# Patient Record
Sex: Male | Born: 1965 | Race: Black or African American | Hispanic: No | Marital: Married | Smoking: Former smoker
Health system: Southern US, Community
[De-identification: ages and names within clinical notes are randomized; demographics above are authoritative.]

## PROBLEM LIST (undated history)

## (undated) DIAGNOSIS — N183 Chronic kidney disease, stage 3 unspecified: Secondary | ICD-10-CM

## (undated) DIAGNOSIS — I712 Thoracic aortic aneurysm, without rupture, unspecified: Secondary | ICD-10-CM

## (undated) DIAGNOSIS — G4730 Sleep apnea, unspecified: Secondary | ICD-10-CM

## (undated) DIAGNOSIS — K8020 Calculus of gallbladder without cholecystitis without obstruction: Secondary | ICD-10-CM

## (undated) DIAGNOSIS — I509 Heart failure, unspecified: Secondary | ICD-10-CM

## (undated) DIAGNOSIS — M109 Gout, unspecified: Secondary | ICD-10-CM

## (undated) DIAGNOSIS — I4819 Other persistent atrial fibrillation: Secondary | ICD-10-CM

## (undated) DIAGNOSIS — IMO0001 Reserved for inherently not codable concepts without codable children: Secondary | ICD-10-CM

## (undated) DIAGNOSIS — Z87442 Personal history of urinary calculi: Secondary | ICD-10-CM

## (undated) DIAGNOSIS — I10 Essential (primary) hypertension: Secondary | ICD-10-CM

## (undated) DIAGNOSIS — E785 Hyperlipidemia, unspecified: Secondary | ICD-10-CM

## (undated) DIAGNOSIS — I2781 Cor pulmonale (chronic): Secondary | ICD-10-CM

## (undated) DIAGNOSIS — E119 Type 2 diabetes mellitus without complications: Secondary | ICD-10-CM

## (undated) HISTORY — DX: Reserved for inherently not codable concepts without codable children: IMO0001

## (undated) HISTORY — DX: Essential (primary) hypertension: I10

## (undated) HISTORY — DX: Calculus of gallbladder without cholecystitis without obstruction: K80.20

## (undated) HISTORY — DX: Morbid (severe) obesity due to excess calories: E66.01

## (undated) HISTORY — DX: Sleep apnea, unspecified: G47.30

## (undated) HISTORY — DX: Hyperlipidemia, unspecified: E78.5

## (undated) HISTORY — DX: Heart failure, unspecified: I50.9

---

## 1998-05-24 HISTORY — PX: LAPAROSCOPIC CHOLECYSTECTOMY: SUR755

## 1998-07-03 ENCOUNTER — Encounter: Payer: Self-pay | Admitting: Emergency Medicine

## 1998-07-03 ENCOUNTER — Emergency Department (HOSPITAL_COMMUNITY): Admission: EM | Admit: 1998-07-03 | Discharge: 1998-07-03 | Payer: Self-pay | Admitting: Emergency Medicine

## 1998-09-04 ENCOUNTER — Inpatient Hospital Stay (HOSPITAL_COMMUNITY): Admission: EM | Admit: 1998-09-04 | Discharge: 1998-09-05 | Payer: Self-pay | Admitting: Emergency Medicine

## 1999-10-16 ENCOUNTER — Encounter: Admission: RE | Admit: 1999-10-16 | Discharge: 1999-10-16 | Payer: Self-pay | Admitting: Family Medicine

## 1999-10-16 ENCOUNTER — Encounter: Payer: Self-pay | Admitting: Family Medicine

## 2000-04-20 ENCOUNTER — Ambulatory Visit (HOSPITAL_BASED_OUTPATIENT_CLINIC_OR_DEPARTMENT_OTHER): Admission: RE | Admit: 2000-04-20 | Discharge: 2000-04-20 | Payer: Self-pay | Admitting: Nephrology

## 2002-08-15 ENCOUNTER — Emergency Department (HOSPITAL_COMMUNITY): Admission: EM | Admit: 2002-08-15 | Discharge: 2002-08-15 | Payer: Self-pay | Admitting: Emergency Medicine

## 2002-08-15 ENCOUNTER — Encounter: Payer: Self-pay | Admitting: Emergency Medicine

## 2004-03-26 ENCOUNTER — Ambulatory Visit (HOSPITAL_BASED_OUTPATIENT_CLINIC_OR_DEPARTMENT_OTHER): Admission: RE | Admit: 2004-03-26 | Discharge: 2004-03-26 | Payer: Self-pay | Admitting: Family Medicine

## 2004-06-22 ENCOUNTER — Ambulatory Visit: Payer: Self-pay | Admitting: Internal Medicine

## 2005-01-13 LAB — HM COLONOSCOPY: HM Colonoscopy: NORMAL

## 2005-09-15 ENCOUNTER — Ambulatory Visit: Payer: Self-pay | Admitting: Internal Medicine

## 2005-09-27 ENCOUNTER — Ambulatory Visit: Payer: Self-pay | Admitting: Internal Medicine

## 2005-09-27 ENCOUNTER — Encounter (INDEPENDENT_AMBULATORY_CARE_PROVIDER_SITE_OTHER): Payer: Self-pay | Admitting: Specialist

## 2007-08-18 DIAGNOSIS — J310 Chronic rhinitis: Secondary | ICD-10-CM

## 2007-08-21 ENCOUNTER — Ambulatory Visit: Payer: Self-pay | Admitting: Internal Medicine

## 2007-08-21 DIAGNOSIS — J9600 Acute respiratory failure, unspecified whether with hypoxia or hypercapnia: Secondary | ICD-10-CM

## 2007-08-27 DIAGNOSIS — G4733 Obstructive sleep apnea (adult) (pediatric): Secondary | ICD-10-CM

## 2007-09-20 ENCOUNTER — Ambulatory Visit: Payer: Self-pay | Admitting: Internal Medicine

## 2007-09-28 ENCOUNTER — Encounter: Payer: Self-pay | Admitting: Internal Medicine

## 2008-04-07 ENCOUNTER — Emergency Department (HOSPITAL_COMMUNITY): Admission: EM | Admit: 2008-04-07 | Discharge: 2008-04-07 | Payer: Self-pay | Admitting: *Deleted

## 2009-09-19 ENCOUNTER — Inpatient Hospital Stay (HOSPITAL_COMMUNITY): Admission: EM | Admit: 2009-09-19 | Discharge: 2009-09-22 | Payer: Self-pay | Admitting: Emergency Medicine

## 2009-09-20 ENCOUNTER — Encounter (INDEPENDENT_AMBULATORY_CARE_PROVIDER_SITE_OTHER): Payer: Self-pay | Admitting: Internal Medicine

## 2009-09-21 ENCOUNTER — Encounter: Payer: Self-pay | Admitting: Cardiology

## 2009-09-21 HISTORY — PX: US ECHOCARDIOGRAPHY: HXRAD669

## 2009-09-27 ENCOUNTER — Emergency Department (HOSPITAL_COMMUNITY): Admission: EM | Admit: 2009-09-27 | Discharge: 2009-09-27 | Payer: Self-pay | Admitting: Emergency Medicine

## 2009-10-02 ENCOUNTER — Ambulatory Visit: Payer: Self-pay | Admitting: Thoracic Surgery (Cardiothoracic Vascular Surgery)

## 2010-03-27 ENCOUNTER — Ambulatory Visit: Payer: Self-pay | Admitting: Cardiology

## 2010-04-06 ENCOUNTER — Ambulatory Visit: Payer: Self-pay | Admitting: Thoracic Surgery (Cardiothoracic Vascular Surgery)

## 2010-04-06 ENCOUNTER — Encounter
Admission: RE | Admit: 2010-04-06 | Discharge: 2010-04-06 | Payer: Self-pay | Admitting: Thoracic Surgery (Cardiothoracic Vascular Surgery)

## 2010-08-11 LAB — BASIC METABOLIC PANEL
BUN: 14 mg/dL (ref 6–23)
CO2: 38 mEq/L — ABNORMAL HIGH (ref 19–32)
Calcium: 8.8 mg/dL (ref 8.4–10.5)
Calcium: 8.8 mg/dL (ref 8.4–10.5)
Calcium: 8.7 mg/dL (ref 8.4–10.5)
Chloride: 97 mEq/L (ref 96–112)
Chloride: 97 mEq/L (ref 96–112)
GFR calc Af Amer: >60 mL/min (ref >60)
GFR calc Af Amer: >60 mL/min (ref >60)
GFR calc Af Amer: >60 mL/min (ref >60)
GFR calc non Af Amer: 60 mL/min — ABNORMAL LOW (ref >60)
GFR calc non Af Amer: >60 mL/min (ref >60)
GFR calc non Af Amer: >60 mL/min (ref >60)
Potassium: 2.9 mEq/L — ABNORMAL LOW (ref 3.5–5.1)
Sodium: 140 mEq/L (ref 135–145)
Sodium: 144 mEq/L (ref 135–145)
Sodium: 142 mEq/L (ref 135–145)

## 2010-08-11 LAB — CBC
HCT: 44 % (ref 39.0–52.0)
Hemoglobin: 15.1 g/dL (ref 13.0–17.0)
MCHC: 34.3 g/dL (ref 30.0–36.0)
MCV: 93.1 fL (ref 78.0–100.0)
Platelets: 216 10*3/uL (ref 150–400)
Platelets: 220 10*3/uL (ref 150–400)
Platelets: 239 10*3/uL (ref 150–400)
RBC: 4.19 MIL/uL — ABNORMAL LOW (ref 4.22–5.81)
RDW: 13.8 % (ref 11.5–15.5)
WBC: 7.4 10*3/uL (ref 4.0–10.5)

## 2010-08-11 LAB — CULTURE, BLOOD (ROUTINE X 2): Culture: NO GROWTH

## 2010-08-11 LAB — POCT I-STAT 3, ART BLOOD GAS (G3+)
Acid-Base Excess: 10 mmol/L — ABNORMAL HIGH (ref 0.0–2.0)
O2 Saturation: 99 %
Patient temperature: 98.6
pCO2 arterial: 68.7 mmHg (ref 35.0–45.0)

## 2010-08-11 LAB — URINALYSIS, ROUTINE W REFLEX MICROSCOPIC
Bilirubin Urine: NEGATIVE
Protein, ur: 100 mg/dL — AB
Urobilinogen, UA: 1 mg/dL (ref 0.0–1.0)

## 2010-08-11 LAB — HEMOGLOBIN A1C: Mean Plasma Glucose: 134 mg/dL — ABNORMAL HIGH (ref <117)

## 2010-08-11 LAB — CARDIAC PANEL(CRET KIN+CKTOT+MB+TROPI)
CK, MB: 4.6 ng/mL — ABNORMAL HIGH (ref 0.3–4.0)
CK, MB: 4.9 ng/mL — ABNORMAL HIGH (ref 0.3–4.0)
Relative Index: 2.7 — ABNORMAL HIGH (ref 0.0–2.5)
Relative Index: 3 — ABNORMAL HIGH (ref 0.0–2.5)
Troponin I: 0.12 ng/mL — ABNORMAL HIGH (ref 0.00–0.06)
Troponin I: 0.13 ng/mL — ABNORMAL HIGH (ref 0.00–0.06)

## 2010-08-11 LAB — DIFFERENTIAL
Basophils Absolute: 0 10*3/uL (ref 0.0–0.1)
Basophils Relative: 1 % (ref 0–1)
Eosinophils Relative: 2 % (ref 0–5)
Neutro Abs: 5 10*3/uL (ref 1.7–7.7)
Neutrophils Relative %: 68 % (ref 43–77)

## 2010-08-11 LAB — POCT I-STAT, CHEM 8
BUN: 14 mg/dL (ref 6–23)
Chloride: 100 mEq/L (ref 96–112)
Creatinine, Ser: 1.1 mg/dL (ref 0.4–1.5)
Glucose, Bld: 141 mg/dL — ABNORMAL HIGH (ref 70–99)
Hemoglobin: 16.3 g/dL (ref 13.0–17.0)
TCO2: 34 mmol/L (ref 0–100)

## 2010-08-11 LAB — BRAIN NATRIURETIC PEPTIDE
Pro B Natriuretic peptide (BNP): 50 pg/mL (ref 0.0–100.0)
Pro B Natriuretic peptide (BNP): 135 pg/mL — ABNORMAL HIGH (ref 0.0–100.0)

## 2010-08-11 LAB — LIPID PANEL
LDL Cholesterol: 142 mg/dL — ABNORMAL HIGH (ref 0–99)
Triglycerides: 204 mg/dL — ABNORMAL HIGH (ref <150)

## 2010-08-11 LAB — URINE CULTURE
Colony Count: NO GROWTH
Culture: NO GROWTH

## 2010-08-11 LAB — TROPONIN I: Troponin I: 0.18 ng/mL — ABNORMAL HIGH (ref 0.00–0.06)

## 2010-08-11 LAB — TSH: TSH: 2.81 u[IU]/mL (ref 0.350–4.500)

## 2010-08-11 LAB — URINE MICROSCOPIC-ADD ON

## 2010-08-11 LAB — MAGNESIUM
Magnesium: 1.5 mg/dL (ref 1.5–2.5)
Magnesium: 1.8 mg/dL (ref 1.5–2.5)

## 2010-10-06 NOTE — Consult Note (Signed)
NEW PATIENT CONSULTATION   Jay Chen, Jay Chen  DOB:  [birth year]                                        Oct 02, 2009  CHART #:  [id number]   REASON FOR CONSULTATION:  Ascending aortic aneurysm.   HISTORY OF PRESENT ILLNESS:  The patient is a 45 year old gentleman who  is recently hospitalized back in April at [facility].  He presented with  shortness of breath.  He was found to have hypertensive crisis.  His  blood pressure apparently was [id number] systolic in the emergency room.  He  was admitted and had a workup, which included a chest CT and  echocardiogram.  He has a history of hypertension, but had not been  taking his medications prior to that admission.  Chest CT showed a 4.7-  cm ascending aortic aneurysm.  The echocardiogram showed severe left  ventricular hypertrophy.  There was mild aortic regurgitation.  There  was mild mitral regurgitation.  He had some transient bradycardia during  his admission and was not started on a beta-blocker.  He was discharged  on lisinopril and Norvasc.   PAST MEDICAL HISTORY:  Significant for hypertension, possible sleep  apnea, recent hypertensive emergency complicated by pulmonary edema,  newly diagnosed thoracic aortic aneurysm, and morbid obesity.   CURRENT MEDICATIONS:  1. Albuterol 1-2 puffs q.4 h p.r.n., which is started on his recent      hospitalization.  2. Norvasc 5 mg b.i.d.  3. Lasix 20 mg b.i.d.  4. Potassium 40 mEq b.i.d.  5. Lipitor 40 mg daily.  6. Lisinopril 20 mg daily.  7. Mucinex p.r.n.   ALLERGIES:  He has no known drug allergies.   FAMILY HISTORY:  Negative for aneurysms to the patient's knowledge.   SOCIAL HISTORY:  He is married.  He has 2 children, one boy and one girl  ages 21 and 55.  He works as a Copywriter, advertising for [employer].  He says he  did smoke occasionally in college, but only on weekends and never was a  regular smoker on a daily basis.  He does not drink.   REVIEW OF SYSTEMS:  He  complains of shortness of breath with exertion  and shortness of breath with lying flat.  He is using home oxygen since  discharge from the hospital.  All other systems are negative.   PHYSICAL EXAMINATION:  Vital Signs:  His blood pressure is 130/92, pulse  76, respirations are 18, and his oxygen saturation is 95% on room air.  Neurological:  He is alert and oriented x3 with no focal deficits.  HEENT:  Unremarkable.  Neck:  Without thyromegaly or palpable adenopathy  or bruits.  Cardiac:  Heart sounds are difficult to hear.  There is no  obvious murmur.  Lungs:  Diminished, but equal breath sounds  bilaterally.  There are no palpable femoral or popliteal aneurysms.  Abdomen:  Morbidly obese and unable to palpate any type of pulsatile  mass there.   LABORATORY DATA:  CT of the chest is reviewed shows a fusiform dilated  ascending aorta, maximal diameter of 4.7 cm.  Echocardiogram report  reviewed revealed mild aortic insufficiency and severe left ventricular  hypertrophy.   Sodium was 139, potassium 4.2, BUN 15, creatinine 1.1, albumin is 4.0.  LFTs within normal limits.  Urinalysis had small amount of  leukocytes.   IMPRESSION:  The patient is a 45 year old gentleman with hypertension  and morbid obesity, now found to have a 4.7 cm ascending aortic  aneurysm.  This would not meet size criteria for surgical replacement.  I reviewed the CT images with the patient and had a long discussion with  him regarding the importance of proper blood pressure management.  His  blood pressure today is 138/92, which under normal circumstances would  not be a great number, but compared to his [id number] systolic on admission, it  is markedly improved.  It is absolutely vital that he continued to have  proper blood pressure management.  Hopefully, he will tolerate being put  on a beta-blocker in addition to his ACE inhibitor and calcium channel  blockers in the future.  He is going to follow up with Dr.  Doreatha Lew  regarding that question.  He does understand that the blood pressure  control is absolute vital to try in slow down or limit the expansion of  his aneurysm.  Not mentioned to reduce the workload on his markedly  hypertrophied heart and decrease his risk for MI or other adverse  cardiac event.   We also discussed the importance of exercise and weight loss, both of  those are vital to his long-term health.  He understands that and seems  appropriately committed to changing his lifestyle particularly important  given that he has young children.   I have recommended the patient that we follow him up in 6 months with a  CT angio of the chest to reassess the ascending aorta, make sure there  is no signs of expansion.  I will schedule him for a return visit and  the CT at that time.   [name], M.D.  Electronically Signed   SCH/MEDQ  D:  10/02/2009  T:  10/03/2009  Job:  [id number]   cc:   Lauraine Rinne, MD  Ludwig Lean. Doreatha Lew, M.D.

## 2010-10-06 NOTE — Assessment & Plan Note (Signed)
OFFICE VISIT   Jay Chen, Jay Chen  DOB:  November 16, 1965                                        April 06, 2010  CHART #:  [id number]   HISTORY:  The patient is a 45 year old gentleman, who was hospitalized  back in the spring at [facility] with hypertensive crisis.  A chest CT was  done at that time and showed a 4.7-4.8 cm ascending aortic aneurysm.  Echocardiogram showed some mild aortic regurgitation and severe left  ventricular hypertrophy.  He was treated with medications.  I saw him in  May.  I reviewed the scans at that time that the aneurysm was not large  enough to warrant surgical intervention and I did advise him regarding  blood pressure control as well as exercise and weight loss.  He states  he really has not been able to lose much weight.  He has been feeling  well.  He states his blood pressure has been pretty well controlled,  usually running about [id number] systolic, A999333 diastolic.  He has not had  any recent medication changes.  He denies any chest pain or shortness of  breath.  He states that his exercise has not been regular.   PAST MEDICAL HISTORY:  Significant for morbid obesity, hypertension,  ascending aortic aneurysm, and sleep apnea.   CURRENT MEDICATIONS:  1. Albuterol inhaler 1-2 q.4 h. p.r.n.  2. Norvasc 5 mg b.i.d.  3. Lasix 20 mg b.i.d.  4. Potassium 40 mEq b.i.d.  5. Lopressor daily.  6. Lisinopril 20 mg daily.  7. Mucinex p.r.n.   REVIEW OF SYSTEMS:  See HPI, otherwise negative.   PHYSICAL EXAMINATION:  General:  The patient is a 45 year old gentleman,  in no acute distress.  He is morbidly obese, otherwise unremarkable.  Vital Signs:  His blood pressure is 155/95, pulse 91, respirations are  18, and his oxygen saturation is 92% on room air.  Neurologic:  Intact.  Lungs:  Equal breath sounds bilaterally.  Cardiac:  Regular rate and  rhythm.  Normal S1 and S2.  I do not hear a murmur.  Neck:  There is no  carotid bruits.   Extremities:  He has 2+ pulses throughout.   LABORATORY DATA:  CT of the chest is reviewed.  It reveals no change in  the diameter of the ascending aorta in comparison to his scan from 6  months ago.  Radiologist's official measurement is 4.9 cm, it is  certainly no larger than that.   IMPRESSION:  The patient is a 45 year old gentleman with a 4.9-cm  ascending aorta.  This does qualify as an aneurysm, but it does not meet  the criteria for surgical intervention.  He does need to be followed.  I  will plan to see him back in 12 months, we will do a CT angiogram of the  chest at that time.  He knows to call 911 and go to the emergency room  immediately if he were to have any kind of severe chest pains.  His  blood pressure is a little elevated today.  He states that he was  anxious about the scan and seeing me, thinking he might need surgery at  this time, so I do not make any change to his medications, thinking this  might be some white coat hypertension, but did advise him  to keep track  of that and if he is persistently in that range, he needs to talk to Dr.  Mellody Drown or Dr. Doreatha Lew about medication issues.  I think it is best if I  leave his medical treatment to those physicians at this time.   [name], M.D.  Electronically Signed   SCH/MEDQ  D:  04/06/2010  T:  04/07/2010  Job:  [id number]   cc:   Lauraine Rinne, MD  Ludwig Lean. Doreatha Lew, M.D.

## 2010-10-09 NOTE — Procedures (Signed)
NAME:  Jay Chen, Jay Chen               ACCOUNT NO.:  [id number]   MEDICAL RECORD NO.:  [id number]          PATIENT TYPE:  OUT   LOCATION:  SLEEP CENTER                 FACILITY:  [facility]   PHYSICIAN:  Clinton D. Annamaria Boots, M.D. DATE OF BIRTH:  March 31, 1966   DATE OF STUDY:  03/26/2004                              NOCTURNAL POLYSOMNOGRAM   REFERRING PHYSICIAN:  Carolann Littler, M.D.   INDICATION FOR STUDY AND HISTORY:  Hypersomnia with sleep apnea.   Epworth sleepiness score 13/24, BMI 48,  weight 320 pounds, neck size 20  inches.   SLEEP ARCHITECTURE:  Total sleep time 292 minutes with sleep efficiency of  69%.  Stage I was 10%; stage II, 75%; Stages III and IV 4% which is  increased.  REM was 12% of total sleep time.  Latency to sleep onset 7  minutes, latency to REM 112 minutes.  Awake after sleep onset 126 minutes.  Arousal index elevated at 87.  Most arousals were related to respiratory  events.   RESPIRATORY DATA:  Split study protocol.  RDI 114.3 per hour indicating very  severe obstructive sleep apnea/hypopnea syndrome before CPAP.  This included  234 obstructive apneas and 8 hypopneas before CPAP.  Events were not  positional.  REM RDI 20.9 per hour.  CPAP was then titrated to 21 CWP, RDI  7.4 per hour.  Several masks were tried ending with a large ResMed Ultra  Mirage nasal/oral mask and heated humidifier.  The patient indicated  claustrophobia and had some difficulty with mask comfort.  He had taken  Sudafed at 0455 a.m. because of sinus congestion.   OXYGEN DATA:  Severe snoring with oxygen desaturation to a nadir of 73%  before CPAP.  After final CPAP control, saturation held 95 to 100% on room  air.   CARDIAC DATA:  Normal sinus rhythm.   MOVEMENT AND PARASOMNIA:  Occasional leg jerks.  Bathroom x 2.   IMPRESSION AND RECOMMENDATIONS:  1.  Very severe obstructive sleep apnea/hypopnea syndrome, RDI 114 per hour      with desaturation to 73%.  2.  CPAP titration to a  relatively high pressure of 21 CWP using a large      ResMed Ultra Mirage nasal/oral mask and heated humidifier.  The patient      may eventually be more comfortable using bilevel PAP instead of CPAP.   Clinton D. Annamaria Boots, M.D.  Diplomate, [employer] adviser of Sleep Medicine                                                           Clinton D. Annamaria Boots, M.D.  Diplomate, [employer] Board   CDY/MEDQ  D:  04/05/2004 12:45:26  T:  04/05/2004 15:24:15  Job:  [id number]   cc:   Carolann Littler, M.D.  [address]  [place]  [place] [address]  Fax: [phone]

## 2010-12-30 ENCOUNTER — Encounter: Payer: Self-pay | Admitting: Nurse Practitioner

## 2011-01-08 ENCOUNTER — Ambulatory Visit (INDEPENDENT_AMBULATORY_CARE_PROVIDER_SITE_OTHER): Payer: BC Managed Care – PPO | Admitting: Nurse Practitioner

## 2011-01-08 ENCOUNTER — Encounter: Payer: Self-pay | Admitting: Nurse Practitioner

## 2011-01-08 VITALS — BP 150/100 | Ht 60.0 in | Wt 393.0 lb

## 2011-01-08 DIAGNOSIS — I10 Essential (primary) hypertension: Secondary | ICD-10-CM

## 2011-01-08 DIAGNOSIS — IMO0001 Reserved for inherently not codable concepts without codable children: Secondary | ICD-10-CM

## 2011-01-08 DIAGNOSIS — I279 Pulmonary heart disease, unspecified: Secondary | ICD-10-CM

## 2011-01-08 DIAGNOSIS — I712 Thoracic aortic aneurysm, without rupture: Secondary | ICD-10-CM

## 2011-01-08 DIAGNOSIS — I2781 Cor pulmonale (chronic): Secondary | ICD-10-CM

## 2011-01-08 DIAGNOSIS — E669 Obesity, unspecified: Secondary | ICD-10-CM

## 2011-01-08 MED ORDER — AMLODIPINE BESYLATE 5 MG PO TABS: 5.0000 mg | ORAL_TABLET | Freq: Two times a day (BID) | ORAL | Status: DC

## 2011-01-08 NOTE — Assessment & Plan Note (Signed)
I think weight loss is crucial. Unfortunately I do not feel he has the motivation to make changes. Weight Watchers is encouraged once again.

## 2011-01-08 NOTE — Assessment & Plan Note (Signed)
He is due for repeat scans in November with [name].

## 2011-01-08 NOTE — Patient Instructions (Signed)
Increase your Norvasc (amlodipine) to 5 mg two times a day Get a new blood pressure cuff - Omron is the better brand I will see you in a month Weight Watcher's is strongly suggested You need to see [name] in November for your repeat CT scan Call for any problems.

## 2011-01-08 NOTE — Assessment & Plan Note (Signed)
I do not think his blood pressure has been controlled. I have increased the Norvasc to BID. I will see again in one month. He is encouraged to get a new bp cuff and start monitoring at home. Patient is agreeable to this plan and will call if any problems develop in the interim.

## 2011-01-08 NOTE — Progress Notes (Signed)
Jay Chen Date of Birth: 1965/07/04   History of Present Illness: Jay Chen is seen back today for an approximate 9 month check. He is seen for Dr. Percival Spanish. He is a former patient of Dr. Susa Simmonds. He has cor pulmonale from morbid obesity. He had a heart failure admission last year. He was found to have a thoracic aneurysm. He is due for repeat scan and visit with [facility] in November. He is not having chest pain. His blood pressure has not been checked in about 1 month. His cuff broke. Prior to that his readings were in the 140/90's. He has not lost weight and unfortunately, continues to gain weight. He is not having swelling. He says his breathing is ok as long as he does not over exert. He is now diabetic. He has had recent labs with his primary care.   Current Outpatient Prescriptions on File Prior to Visit  Medication Sig Dispense Refill  . ALBUTEROL IN Inhale into the lungs as needed. 2 puffs       . atorvastatin (LIPITOR) 40 MG tablet Take 40 mg by mouth daily.        . cloNIDine (CATAPRES) 0.1 MG tablet Take 0.1 mg by mouth daily.        . fish oil-omega-3 fatty acids 1000 MG capsule Take 2 g by mouth daily.        . furosemide (LASIX) 20 MG tablet Take 20 mg by mouth daily.       . GuaiFENesin (MUCINEX PO) Take 600 mg by mouth as needed.        [name] lisinopril (PRINIVIL,ZESTRIL) 20 MG tablet Take 40 mg by mouth daily.       [name] MAGNESIUM PO Take 64 mg by mouth daily.        . Metformin HCl 500 MG/5ML SOLN Take 500 mg by mouth 2 (two) times daily.        . potassium chloride 40 MEQ/15ML (20%) LIQD Take by mouth. 2 BID       . DISCONTD: amLODipine (NORVASC) 5 MG tablet Take 5 mg by mouth daily.         No Known Allergies  Past Medical History  Diagnosis Date  . Congestive heart failure May of 2011    Felt to have cor pulmonale  . Hypertension     heart disease  . Thoracic aneurysm     Followed by [name]  . Morbid obesity   . Diabetes mellitus     Past Surgical History   Procedure Date  . US echocardiography 09/21/2009    EF 45-50%  . Cholecystectomy 2000    History  Smoking status  . Never Smoker   Smokeless tobacco  . Never Used    History  Alcohol Use  . Yes    Rarely    Family History  Problem Relation Age of Onset  . Hypertension Mother   . Pancreatic cancer Father     Review of Systems: The review of systems is positive for elevated blood pressure. No change in his shortness of breath. No chest pain. He will have some occasional belly pain. No back pain.  All other systems were reviewed and are negative.  Physical Exam: BP 150/100  Ht 5' (1.524 m)  Wt 393 lb (178.264 kg)  BMI 76.75 kg/m2 Patient is pleasant and in no acute distress. He is morbidly obese. Skin is warm and dry. Color is normal.  HEENT is unremarkable. Normocephalic/atraumatic. PERRL. Sclera are nonicteric.  Neck is supple. No masses. No JVD. Lungs are clear but decreased. Cardiac exam shows a regular rate and rhythm. Heart tones are distant. Abdomen is obese but soft. Extremities are full without edema. Gait and ROM are intact. No gross neurologic deficits noted.  LABORATORY DATA:   Assessment / Plan:

## 2011-01-13 ENCOUNTER — Telehealth: Payer: Self-pay | Admitting: Nurse Practitioner

## 2011-01-13 NOTE — Telephone Encounter (Signed)
Needs last OV Faxed

## 2011-01-13 NOTE — Telephone Encounter (Signed)
Last OV faxed today

## 2011-02-04 ENCOUNTER — Other Ambulatory Visit: Payer: Self-pay | Admitting: Orthopedic Surgery

## 2011-02-04 DIAGNOSIS — M25511 Pain in right shoulder: Secondary | ICD-10-CM

## 2011-02-08 ENCOUNTER — Encounter: Payer: Self-pay | Admitting: Nurse Practitioner

## 2011-02-08 ENCOUNTER — Ambulatory Visit (INDEPENDENT_AMBULATORY_CARE_PROVIDER_SITE_OTHER): Payer: BC Managed Care – PPO | Admitting: Nurse Practitioner

## 2011-02-08 VITALS — BP 140/90 | HR 68 | Ht 68.0 in | Wt 378.0 lb

## 2011-02-08 DIAGNOSIS — I2781 Cor pulmonale (chronic): Secondary | ICD-10-CM

## 2011-02-08 DIAGNOSIS — I10 Essential (primary) hypertension: Secondary | ICD-10-CM

## 2011-02-08 DIAGNOSIS — I279 Pulmonary heart disease, unspecified: Secondary | ICD-10-CM

## 2011-02-08 DIAGNOSIS — I712 Thoracic aortic aneurysm, without rupture: Secondary | ICD-10-CM

## 2011-02-08 DIAGNOSIS — IMO0001 Reserved for inherently not codable concepts without codable children: Secondary | ICD-10-CM

## 2011-02-08 DIAGNOSIS — E669 Obesity, unspecified: Secondary | ICD-10-CM

## 2011-02-08 MED ORDER — CLONIDINE HCL 0.2 MG PO TABS: 0.2000 mg | ORAL_TABLET | Freq: Every day | ORAL | Status: DC

## 2011-02-08 NOTE — Assessment & Plan Note (Signed)
He is due to see [name] in November. [facility] has him on a recall list per my phone conversation this morning.

## 2011-02-08 NOTE — Assessment & Plan Note (Signed)
He has embarked on a program of weight loss. We discussed this in detail today.

## 2011-02-08 NOTE — Patient Instructions (Addendum)
You should be seeing [name] in November regarding your repeat CT scan for your aneurysm. They will be setting up your appointment next month.   I want you to increase your Clonidine to 0.2 mg at bedtime I will see you in a month  Continue to work on your weight and diet. I congratulate you for the progress you have made this past month.

## 2011-02-08 NOTE — Assessment & Plan Note (Signed)
Blood pressure is not at goal. Will increase the Clonidine to 0.2 mg at bedtime. I will see again in one month. Patient is agreeable to this plan and will call if any problems develop in the interim.

## 2011-02-08 NOTE — Progress Notes (Signed)
    Jay Chen Date of Birth: 02/13/1966   History of Present Illness: Jay Chen is seen back today for a one month check. He is seen for Dr. Percival Spanish. I increased his Norvasc for his blood pressure one month ago. Blood pressure is a little better but still not at goal. He has no complaints. He has gotten a new blood pressure cuff. He is tolerating his medicines. He does not have significant dry mouth or swelling. No chest pain. He has entered into a weight loss program with the insurance company. It is the [facility] More & Eat Less program. He is losing weight. Blood sugars are still labile.   Current Outpatient Prescriptions on File Prior to Visit  Medication Sig Dispense Refill  . ALBUTEROL IN Inhale into the lungs as needed. 2 puffs       . atorvastatin (LIPITOR) 40 MG tablet Take 40 mg by mouth daily.        . cloNIDine (CATAPRES) 0.1 MG tablet Take 0.1 mg by mouth daily.        . fish oil-omega-3 fatty acids 1000 MG capsule Take 2 g by mouth daily.        . furosemide (LASIX) 20 MG tablet Take 20 mg by mouth 2 (two) times daily.       . GuaiFENesin (MUCINEX PO) Take 600 mg by mouth as needed.        [name] MAGNESIUM PO Take 64 mg by mouth daily.        . Metformin HCl 500 MG/5ML SOLN Take 500 mg by mouth 2 (two) times daily.          No Known Allergies  Past Medical History  Diagnosis Date  . Congestive heart failure May of 2011    Felt to have cor pulmonale  . Hypertension     heart disease  . Thoracic aneurysm     Followed by [name]  . Morbid obesity   . Diabetes mellitus     Past Surgical History  Procedure Date  . US echocardiography 09/21/2009    EF 45-50%  . Cholecystectomy 2000    History  Smoking status  . Never Smoker   Smokeless tobacco  . Never Used    History  Alcohol Use  . Yes    Rarely    Family History  Problem Relation Age of Onset  . Hypertension Mother   . Pancreatic cancer Father     Review of Systems: The review of  systems is positive for shoulder pain. He will be having an MRI later this week for evaluation.  All other systems were reviewed and are negative.  Physical Exam: BP 142/90  Pulse 68  Ht 5\' 8"  (1.727 m)  Wt 378 lb (171.46 kg)  BMI 57.47 kg/m2 Patient is a morbidly obese white male who is in no acute distress. Skin is warm and dry. Color is normal.  HEENT is unremarkable. Normocephalic/atraumatic. PERRL. Sclera are nonicteric. Neck is supple. No masses. No JVD. Lungs are clear. Cardiac exam shows a regular rate and rhythm. Probable S4 noted. Heart tones are distant. Abdomen is very obese but soft. Extremities are without edema. Gait and ROM are intact. No gross neurologic deficits noted.   LABORATORY DATA:   Assessment / Plan:

## 2011-02-08 NOTE — Assessment & Plan Note (Signed)
No current shortness of breath reported. His EF is 45 to 50%.

## 2011-02-10 ENCOUNTER — Ambulatory Visit
Admission: RE | Admit: 2011-02-10 | Discharge: 2011-02-10 | Disposition: A | Discharge status: Home / Self Care | Payer: BC Managed Care – PPO | Source: Ambulatory Visit | Attending: Orthopedic Surgery | Admitting: Orthopedic Surgery

## 2011-02-10 DIAGNOSIS — M25511 Pain in right shoulder: Secondary | ICD-10-CM

## 2011-02-15 ENCOUNTER — Other Ambulatory Visit: Payer: Self-pay | Admitting: Orthopedic Surgery

## 2011-02-15 DIAGNOSIS — M25511 Pain in right shoulder: Secondary | ICD-10-CM

## 2011-02-17 ENCOUNTER — Ambulatory Visit
Admission: RE | Admit: 2011-02-17 | Discharge: 2011-02-17 | Disposition: A | Discharge status: Home / Self Care | Payer: BC Managed Care – PPO | Source: Ambulatory Visit | Attending: Orthopedic Surgery | Admitting: Orthopedic Surgery

## 2011-02-17 DIAGNOSIS — M25511 Pain in right shoulder: Secondary | ICD-10-CM

## 2011-02-17 MED ORDER — IOHEXOL 180 MG/ML  SOLN
15.0000 mL | Freq: Once | INTRAMUSCULAR | Status: AC | PRN
  Administered 2011-02-17: 15 mL via INTRA_ARTICULAR

## 2011-02-23 LAB — URINALYSIS, ROUTINE W REFLEX MICROSCOPIC
Glucose, UA: NEGATIVE
Leukocytes, UA: NEGATIVE
Nitrite: NEGATIVE
Protein, ur: NEGATIVE
Urobilinogen, UA: 1

## 2011-02-23 LAB — POCT I-STAT, CHEM 8
Calcium, Ion: 1.08 — ABNORMAL LOW
Creatinine, Ser: 1.4
Glucose, Bld: 161 — ABNORMAL HIGH
Hemoglobin: 14.6
TCO2: 36

## 2011-02-23 LAB — URINE MICROSCOPIC-ADD ON

## 2011-03-09 ENCOUNTER — Ambulatory Visit: Payer: BC Managed Care – PPO | Admitting: Nurse Practitioner

## 2011-03-10 ENCOUNTER — Other Ambulatory Visit: Payer: Self-pay | Admitting: Thoracic Surgery (Cardiothoracic Vascular Surgery)

## 2011-03-10 DIAGNOSIS — I712 Thoracic aortic aneurysm, without rupture: Secondary | ICD-10-CM

## 2011-03-17 ENCOUNTER — Ambulatory Visit: Payer: BC Managed Care – PPO | Admitting: Nurse Practitioner

## 2011-03-23 ENCOUNTER — Ambulatory Visit (INDEPENDENT_AMBULATORY_CARE_PROVIDER_SITE_OTHER): Payer: BC Managed Care – PPO | Admitting: Nurse Practitioner

## 2011-03-23 ENCOUNTER — Encounter: Payer: Self-pay | Admitting: Nurse Practitioner

## 2011-03-23 VITALS — BP 130/100 | HR 76 | Ht 68.0 in | Wt 383.4 lb

## 2011-03-23 DIAGNOSIS — I712 Thoracic aortic aneurysm, without rupture: Secondary | ICD-10-CM

## 2011-03-23 DIAGNOSIS — I10 Essential (primary) hypertension: Secondary | ICD-10-CM

## 2011-03-23 DIAGNOSIS — IMO0001 Reserved for inherently not codable concepts without codable children: Secondary | ICD-10-CM

## 2011-03-23 MED ORDER — LISINOPRIL 20 MG PO TABS: 20.0000 mg | ORAL_TABLET | Freq: Two times a day (BID) | ORAL | Status: DC

## 2011-03-23 MED ORDER — CLONIDINE HCL 0.1 MG PO TABS: ORAL_TABLET | ORAL | Status: DC

## 2011-03-23 NOTE — Assessment & Plan Note (Signed)
To see [facility] next Monday with repeat CT scan.

## 2011-03-23 NOTE — Patient Instructions (Signed)
We are going to increase the Clonidine to 0.1 mg in the am and 0.2 mg at night  Stay on your other medicines.  I will see you in about 6 weeks. You will see Dr. Percival Spanish.

## 2011-03-23 NOTE — Assessment & Plan Note (Signed)
Blood pressure is a little better. He tolerates Clonidine very well. I have increased him to 0.1 mg in the am and 0.2 mg in the pm. I will have him see Dr. Percival Spanish for a new patient visit in 6 weeks. He has labs with his PCP. There is the possibility of upcoming shoulder surgery. He will need clearance and probable stress testing if he were to proceed. Patient is agreeable to this plan and will call if any problems develop in the interim.

## 2011-03-23 NOTE — Progress Notes (Signed)
Jay Chen Date of Birth: 1965/11/28 Medical Record [id number]  History of Present Illness: Jay Chen is seen back today for a follow up visit. He is seen for Dr. Percival Spanish. He has HTN, cor pulmonale and known thoracic aneurysm. He is morbidly obese. He says he is doing well. Blood pressure at home is in the 130/90-100 range. He says he is tolerating his medicines. No swelling, no cough, no shortness of breath. His weight is back up.   Current Outpatient Prescriptions on File Prior to Visit  Medication Sig Dispense Refill  . ALBUTEROL IN Inhale into the lungs as needed. 2 puffs       . amLODipine (NORVASC) 10 MG tablet Take 5 mg by mouth 2 (two) times daily.       [name] atorvastatin (LIPITOR) 40 MG tablet Take 40 mg by mouth daily.        . fenofibrate 54 MG tablet Take 54 mg by mouth daily.        . fish oil-omega-3 fatty acids 1000 MG capsule Take 2 g by mouth daily.        . furosemide (LASIX) 20 MG tablet Take 20 mg by mouth 2 (two) times daily.       . GuaiFENesin (MUCINEX PO) Take 600 mg by mouth as needed.        [name] MAGNESIUM PO Take 64 mg by mouth daily.        . Metformin HCl 500 MG/5ML SOLN Take 500 mg by mouth 2 (two) times daily.        . potassium chloride SA (K-DUR,KLOR-CON) 20 MEQ tablet Take 20 mEq by mouth 2 (two) times daily.          No Known Allergies  Past Medical History  Diagnosis Date  . Congestive heart failure May of 2011    Felt to have cor pulmonale; EF 45 to 50% from echo in May 2011  . Hypertension   . Thoracic aneurysm     Followed by [name]  . Morbid obesity   . Diabetes mellitus   . Hyperlipidemia     Past Surgical History  Procedure Date  . US echocardiography 09/21/2009    EF 45-50%; Cavity size was severely dilated, severe concentric hypertrophy and normal wall motion  . Cholecystectomy 2000    History  Smoking status  . Never Smoker   Smokeless tobacco  . Never Used    History  Alcohol Use  . Yes    Rarely    Family  History  Problem Relation Age of Onset  . Hypertension Mother   . Pancreatic cancer Father     Review of Systems: The review of systems is positive for right shoulder pain. Has had some injections. Possible surgery in the future. He has fasting labs later in November with his PCP. He has an appointment in November with [name] to follow up his thoracic aneurysm.  All other systems were reviewed and are negative.  Physical Exam: BP 130/100  Pulse 76  Ht 5\' 8"  (1.727 m)  Wt 383 lb 6.4 oz (173.909 kg)  BMI 58.30 kg/m2 Patient is morbidly obese but in no acute distress. Skin is warm and dry. Color is normal.  HEENT is unremarkable. Normocephalic/atraumatic. PERRL. Sclera are nonicteric. Neck is supple. No masses. No JVD. Lungs are clear with decreased breath sounds. Cardiac exam shows a regular rate and rhythm. Heart tones are distant. Abdomen is obese but soft. Extremities are full but without edema.  Gait and ROM are intact. No gross neurologic deficits noted.  LABORATORY DATA:   Assessment / Plan:

## 2011-03-23 NOTE — Assessment & Plan Note (Signed)
I think this is the crux of him doing well. Unfortunately, weight continues to climb.

## 2011-04-08 ENCOUNTER — Ambulatory Visit (INDEPENDENT_AMBULATORY_CARE_PROVIDER_SITE_OTHER): Payer: BC Managed Care – PPO | Admitting: Thoracic Surgery (Cardiothoracic Vascular Surgery)

## 2011-04-08 ENCOUNTER — Encounter: Payer: Self-pay | Admitting: Thoracic Surgery (Cardiothoracic Vascular Surgery)

## 2011-04-08 ENCOUNTER — Ambulatory Visit
Admission: RE | Admit: 2011-04-08 | Discharge: 2011-04-08 | Disposition: A | Discharge status: Home / Self Care | Payer: BC Managed Care – PPO | Source: Ambulatory Visit | Attending: Thoracic Surgery (Cardiothoracic Vascular Surgery) | Admitting: Thoracic Surgery (Cardiothoracic Vascular Surgery)

## 2011-04-08 VITALS — BP 152/98 | HR 88 | Resp 24 | Ht 68.0 in | Wt 380.0 lb

## 2011-04-08 DIAGNOSIS — I712 Thoracic aortic aneurysm, without rupture, unspecified: Secondary | ICD-10-CM

## 2011-04-08 MED ORDER — IOHEXOL 300 MG/ML  SOLN
125.0000 mL | Freq: Once | INTRAMUSCULAR | Status: AC | PRN
  Administered 2011-04-08: 125 mL via INTRAVENOUS

## 2011-04-08 NOTE — Progress Notes (Signed)
  [name] is a 45 year old male who was found in the spring of 2011 have a 4.8 cm descending aortic aneurysm, he had mild aortic regurgitation and also had severe left ventricular hypertrophy. He been hospitalized at that time for hypertensive crisis. I saw him in the office in November of 2011 at which time the aneurysm was stable, but his blood pressure was elevated. His antihypertensive regimen has been changed since that visit.  He says that is not having any chest pain or shortness of breath. He's lost about 20 pounds over 3 months. He is checking his blood pressure at home and is running in the [id number] to [id number] systolic and [id number] diastolic range. He says he is walking 30 minutes a day.  Past Medical History  Diagnosis Date  . Congestive heart failure May of 2011    Felt to have cor pulmonale; EF 45 to 50% from echo in May 2011  . Hypertension   . Thoracic aneurysm     Followed by Dr. Roxan Chen  . Morbid obesity   . Diabetes mellitus   . Hyperlipidemia     Physical exam  BP 152/98  Pulse 88  Resp 24  Ht 5\' 8"  (1.727 m)  Wt 380 lb (172.367 kg)  BMI 57.78 kg/m2  SpO2 92% Gen.: Morbidly obese, in no acute distress HEENT: Unremarkable Neck no bruits Cardiac exam regular rate and rhythm, normal S1-S2, am unable to hear a murmur Lungs clear equal breath sounds bilaterally Extremities no pitting edema   Impression [name] is a 45 year old gentleman with severe hypertension who has a 4.8-4.9 cm ascending aortic aneurysm. This is essentially unchanged over an 59-month period. His blood pressure is elevated again in the office today, question whether this might be whitecoat hypertension given the he says at home his blood pressure is much better controlled. He is on a good medical regimen for his hypertension. I recommended to him that if he did see any signs his blood pressure increasing his ambulatory measurements that he should contact Dr. Abbott Chen.  I congratulated him on his 20  pound weight loss, and encouraged him to continue to work at that is vital to his long-term health.  I will plan to see him back in one year with a repeat CT angiogram of the chest. He knows to call for medical help immediately if he were to develop severe chest or back pain.

## 2011-05-03 ENCOUNTER — Ambulatory Visit: Payer: BC Managed Care – PPO | Admitting: Cardiology

## 2011-06-01 ENCOUNTER — Encounter: Payer: Self-pay | Admitting: Cardiology

## 2011-06-01 ENCOUNTER — Ambulatory Visit (INDEPENDENT_AMBULATORY_CARE_PROVIDER_SITE_OTHER): Payer: BC Managed Care – PPO | Admitting: Cardiology

## 2011-06-01 VITALS — BP 150/80 | HR 82 | Ht 68.0 in | Wt 377.0 lb

## 2011-06-01 DIAGNOSIS — I10 Essential (primary) hypertension: Secondary | ICD-10-CM

## 2011-06-01 DIAGNOSIS — I279 Pulmonary heart disease, unspecified: Secondary | ICD-10-CM

## 2011-06-01 DIAGNOSIS — IMO0001 Reserved for inherently not codable concepts without codable children: Secondary | ICD-10-CM

## 2011-06-01 DIAGNOSIS — G4730 Sleep apnea, unspecified: Secondary | ICD-10-CM

## 2011-06-01 DIAGNOSIS — I428 Other cardiomyopathies: Secondary | ICD-10-CM

## 2011-06-01 DIAGNOSIS — I2781 Cor pulmonale (chronic): Secondary | ICD-10-CM

## 2011-06-01 DIAGNOSIS — I712 Thoracic aortic aneurysm, without rupture: Secondary | ICD-10-CM

## 2011-06-01 NOTE — Patient Instructions (Addendum)
Your physician wants you to follow-up in: 6 MONTHS You will receive a reminder letter in the mail two months in advance. If you don't receive a letter, please call our office to schedule the follow-up appointment.   Your physician has requested that you have an echocardiogram. Echocardiography is a painless test that uses sound waves to create images of your heart. It provides your doctor with information about the size and shape of your heart and how well your heart's chambers and valves are working. This procedure takes approximately one hour. There are no restrictions for this procedure.

## 2011-06-01 NOTE — Assessment & Plan Note (Signed)
Continue CPAP.  

## 2011-06-01 NOTE — Assessment & Plan Note (Signed)
Most likely from sleep apnea and obesity. Will repeat echocardiogram to evaluate LV function, RV function and aortic insufficiency given thoracic aneurysm.

## 2011-06-01 NOTE — Assessment & Plan Note (Signed)
Patient counseled on weight loss. 

## 2011-06-01 NOTE — Assessment & Plan Note (Signed)
Followed by cardiothoracic surgery.

## 2011-06-01 NOTE — Assessment & Plan Note (Signed)
Patient monitors his blood pressure at home. It is typically 130/85. We will continue with present medications and he will contact as if it runs high. We can then increase medications as needed.

## 2011-06-01 NOTE — Progress Notes (Signed)
HPI: Pleasant male previously followed by Dr Doreatha Lew for fu of HTN, cor pulmonale and known thoracic aneurysm (followed by Dr Roxan Hockey of [facility]). Echo in May of 2011 showed EF 45-50, mild AI, moderately dilated aorta, mild MR and moderate LAE. Since he was last seen, the patient has dyspnea with more extreme activities but not with routine activities. It is relieved with rest. It is not associated with chest pain. There is no orthopnea, PND or pedal edema. There is no syncope or palpitations. There is no exertional chest pain.    Current Outpatient Prescriptions  Medication Sig Dispense Refill  . ALBUTEROL IN Inhale into the lungs as needed. 2 puffs       . amLODipine (NORVASC) 10 MG tablet Take 5 mg by mouth 2 (two) times daily.       [name] atorvastatin (LIPITOR) 40 MG tablet Take 40 mg by mouth daily.        . cloNIDine (CATAPRES) 0.1 MG tablet 0.1 mg each am and 0.2 mg each night.  90 tablet  6  . fenofibrate 54 MG tablet Take 54 mg by mouth daily.        . fish oil-omega-3 fatty acids 1000 MG capsule Take 2 g by mouth daily.        . furosemide (LASIX) 20 MG tablet Take 20 mg by mouth 2 (two) times daily.       . GuaiFENesin (MUCINEX PO) Take 600 mg by mouth as needed.        [name] lisinopril (PRINIVIL,ZESTRIL) 20 MG tablet Take 1 tablet (20 mg total) by mouth 2 (two) times daily.  60 tablet  11  . MAGNESIUM PO Take 64 mg by mouth daily.        . potassium chloride SA (K-DUR,KLOR-CON) 20 MEQ tablet Take 20 mEq by mouth 2 (two) times daily.        . sitaGLIPtan-metformin (JANUMET) 50-500 MG per tablet Take 1 tablet by mouth 2 (two) times daily with a meal.           Past Medical History  Diagnosis Date  . Congestive heart failure May of 2011    Felt to have cor pulmonale; EF 45 to 50% from echo in May 2011  . Hypertension   . Thoracic aneurysm     Followed by [name]  . Morbid obesity   . Diabetes mellitus   . Hyperlipidemia     Past Surgical History  Procedure Date  . US  echocardiography 09/21/2009    EF 45-50%; Cavity size was severely dilated, severe concentric hypertrophy and normal wall motion  . Cholecystectomy 2000    History   Social History  . Marital Status: Married    Spouse Name: N/A    Number of Children: N/A  . Years of Education: N/A   Occupational History  . Not on file.   Social History Main Topics  . Smoking status: Never Smoker   . Smokeless tobacco: Never Used  . Alcohol Use: Yes     Rarely  . Drug Use: No  . Sexually Active: Not on file   Other Topics Concern  . Not on file   Social History Narrative  . No narrative on file    ROS: no fevers or chills, productive cough, hemoptysis, dysphasia, odynophagia, melena, hematochezia, dysuria, hematuria, rash, seizure activity, orthopnea, PND, pedal edema, claudication. Remaining systems are negative.  Physical Exam: Well-developed morbidly obese in no acute distress.  Skin is warm and dry.  HEENT is normal.  Neck is supple. No thyromegaly.  Chest is clear to auscultation with normal expansion.  Cardiovascular exam is regular rate and rhythm.  Abdominal exam nontender or distended. No masses palpated. Extremities show trace edema. neuro grossly intact  ECG NSR with first degree AV block, RVCD, prolonged QT

## 2011-06-07 ENCOUNTER — Ambulatory Visit (HOSPITAL_COMMUNITY): Payer: BC Managed Care – PPO | Attending: Internal Medicine | Admitting: Radiology

## 2011-06-07 DIAGNOSIS — I509 Heart failure, unspecified: Secondary | ICD-10-CM | POA: Insufficient documentation

## 2011-06-07 DIAGNOSIS — E785 Hyperlipidemia, unspecified: Secondary | ICD-10-CM | POA: Insufficient documentation

## 2011-06-07 DIAGNOSIS — E119 Type 2 diabetes mellitus without complications: Secondary | ICD-10-CM | POA: Insufficient documentation

## 2011-06-07 DIAGNOSIS — I712 Thoracic aortic aneurysm, without rupture, unspecified: Secondary | ICD-10-CM | POA: Insufficient documentation

## 2011-06-07 DIAGNOSIS — I359 Nonrheumatic aortic valve disorder, unspecified: Secondary | ICD-10-CM | POA: Insufficient documentation

## 2011-06-07 DIAGNOSIS — I428 Other cardiomyopathies: Secondary | ICD-10-CM | POA: Insufficient documentation

## 2011-06-07 DIAGNOSIS — I059 Rheumatic mitral valve disease, unspecified: Secondary | ICD-10-CM

## 2011-06-07 DIAGNOSIS — G4730 Sleep apnea, unspecified: Secondary | ICD-10-CM | POA: Insufficient documentation

## 2011-06-07 DIAGNOSIS — I10 Essential (primary) hypertension: Secondary | ICD-10-CM | POA: Insufficient documentation

## 2012-01-19 ENCOUNTER — Ambulatory Visit (INDEPENDENT_AMBULATORY_CARE_PROVIDER_SITE_OTHER): Payer: BC Managed Care – PPO | Admitting: Internal Medicine

## 2012-01-19 ENCOUNTER — Encounter: Payer: Self-pay | Admitting: Internal Medicine

## 2012-01-19 ENCOUNTER — Other Ambulatory Visit (INDEPENDENT_AMBULATORY_CARE_PROVIDER_SITE_OTHER): Payer: BC Managed Care – PPO

## 2012-01-19 VITALS — BP 142/90 | HR 74 | Temp 98.6°F | Resp 17 | Ht 68.0 in | Wt 384.5 lb

## 2012-01-19 DIAGNOSIS — E782 Mixed hyperlipidemia: Secondary | ICD-10-CM

## 2012-01-19 DIAGNOSIS — Z0000 Encounter for general adult medical examination without abnormal findings: Secondary | ICD-10-CM

## 2012-01-19 DIAGNOSIS — IMO0001 Reserved for inherently not codable concepts without codable children: Secondary | ICD-10-CM

## 2012-01-19 DIAGNOSIS — I10 Essential (primary) hypertension: Secondary | ICD-10-CM

## 2012-01-19 DIAGNOSIS — E118 Type 2 diabetes mellitus with unspecified complications: Secondary | ICD-10-CM | POA: Insufficient documentation

## 2012-01-19 DIAGNOSIS — Z23 Encounter for immunization: Secondary | ICD-10-CM

## 2012-01-19 LAB — LIPID PANEL
HDL: 34.6 mg/dL — ABNORMAL LOW (ref >39.00)
LDL Cholesterol: 67 mg/dL (ref 0–99)
Total CHOL/HDL Ratio: 4
Triglycerides: 166 mg/dL — ABNORMAL HIGH (ref 0.0–149.0)

## 2012-01-19 LAB — CBC WITH DIFFERENTIAL/PLATELET
Basophils Absolute: 0 10*3/uL (ref 0.0–0.1)
Eosinophils Absolute: 0.3 10*3/uL (ref 0.0–0.7)
Lymphocytes Relative: 24.8 % (ref 12.0–46.0)
MCHC: 32.8 g/dL (ref 30.0–36.0)
MCV: 94.1 fl (ref 78.0–100.0)
Monocytes Absolute: 0.6 10*3/uL (ref 0.1–1.0)
Neutrophils Relative %: 63 % (ref 43.0–77.0)
RDW: 13.6 % (ref 11.5–14.6)

## 2012-01-19 LAB — MICROALBUMIN / CREATININE URINE RATIO
Creatinine,U: 76.7 mg/dL
Microalb Creat Ratio: 1.2 mg/g (ref 0.0–30.0)
Microalb, Ur: 0.9 mg/dL (ref 0.0–1.9)

## 2012-01-19 LAB — COMPREHENSIVE METABOLIC PANEL
ALT: 19 U/L (ref 0–53)
AST: 18 U/L (ref 0–37)
Albumin: 3.6 g/dL (ref 3.5–5.2)
Alkaline Phosphatase: 63 U/L (ref 39–117)
Calcium: 9.4 mg/dL (ref 8.4–10.5)
Chloride: 93 mEq/L — ABNORMAL LOW (ref 96–112)
Creatinine, Ser: 1.3 mg/dL (ref 0.4–1.5)
Potassium: 3.2 mEq/L — ABNORMAL LOW (ref 3.5–5.1)

## 2012-01-19 LAB — PSA: PSA: 0.37 ng/mL (ref 0.10–4.00)

## 2012-01-19 LAB — URINALYSIS, ROUTINE W REFLEX MICROSCOPIC
Hgb urine dipstick: NEGATIVE
Ketones, ur: NEGATIVE
Specific Gravity, Urine: 1.015 (ref 1.000–1.030)
Urine Glucose: NEGATIVE
Urobilinogen, UA: 0.2 (ref 0.0–1.0)

## 2012-01-19 LAB — HEMOGLOBIN A1C: Hgb A1c MFr Bld: 6.8 % — ABNORMAL HIGH (ref 4.6–6.5)

## 2012-01-19 LAB — CK: Total CK: 161 U/L (ref 7–232)

## 2012-01-19 MED ORDER — SITAGLIPTIN PHOS-METFORMIN HCL 50-500 MG PO TABS: 1.0000 | ORAL_TABLET | Freq: Two times a day (BID) | ORAL | Status: DC

## 2012-01-19 MED ORDER — OLMESARTAN-AMLODIPINE-HCTZ 40-10-25 MG PO TABS: 1.0000 | ORAL_TABLET | Freq: Every day | ORAL | Status: DC

## 2012-01-19 NOTE — Patient Instructions (Signed)
Diabetes, Type 2 Diabetes is a long-lasting (chronic) disease. In type 2 diabetes, the pancreas does not make enough insulin (a hormone), and the body does not respond normally to the insulin that is made. This type of diabetes was also previously called adult-onset diabetes. It usually occurs after the age of 53, but it can occur at any age.  CAUSES  Type 2 diabetes happens because the pancreasis not making enough insulin or your body has trouble using the insulin that your pancreas does make properly. SYMPTOMS   Drinking more than usual.   Urinating more than usual.   Blurred vision.   Dry, itchy skin.   Frequent infections.   Feeling more tired than usual (fatigue).  DIAGNOSIS The diagnosis of type 2 diabetes is usually made by one of the following tests:  Fasting blood glucose test. You will not eat for at least 8 hours and then take a blood test.   Random blood glucose test. Your blood glucose (sugar) is checked at any time of the day regardless of when you ate.   Oral glucose tolerance test (OGTT). Your blood glucose is measured after you have not eaten (fasted) and then after you drink a glucose containing beverage.  TREATMENT   Healthy eating.   Exercise.   Medicine, if needed.   Monitoring blood glucose.   Seeing your caregiver regularly.  HOME CARE INSTRUCTIONS   Check your blood glucose at least once a day. More frequent monitoring may be necessary, depending on your medicines and on how well your diabetes is controlled. Your caregiver will advise you.   Take your medicine as directed by your caregiver.   Do not smoke.   Make wise food choices. Ask your caregiver for information. Weight loss can improve your diabetes.   Learn about low blood glucose (hypoglycemia) and how to treat it.   Get your eyes checked regularly.   Have a yearly physical exam. Have your blood pressure checked and your blood and urine tested.   Wear a pendant or bracelet saying  that you have diabetes.   Check your feet every night for cuts, sores, blisters, and redness. Let your caregiver know if you have any problems.  SEEK MEDICAL CARE IF:   You have problems keeping your blood glucose in target range.   You have problems with your medicines.   You have symptoms of an illness that do not improve after 24 hours.   You have a sore or wound that is not healing.   You notice a change in vision or a new problem with your vision.   You have a fever.  MAKE SURE YOU:  Understand these instructions.   Will watch your condition.   Will get help right away if you are not doing well or get worse.  Document Released: 05/10/2005 Document Revised: 04/29/2011 Document Reviewed: 10/26/2010 [facility] Patient Information 2012 [place].Health Maintenance, Males A healthy lifestyle and preventative care can promote health and wellness.  Maintain regular health, dental, and eye exams.   Eat a healthy diet. Foods like vegetables, fruits, whole grains, low-fat dairy products, and lean protein foods contain the nutrients you need without too many calories. Decrease your intake of foods high in solid fats, added sugars, and salt. Get information about a proper diet from your caregiver, if necessary.   Regular physical exercise is one of the most important things you can do for your health. Most adults should get at least 150 minutes of moderate-intensity exercise (any activity  that increases your heart rate and causes you to sweat) each week. In addition, most adults need muscle-strengthening exercises on 2 or more days a week.    Maintain a healthy weight. The body mass index (BMI) is a screening tool to identify possible weight problems. It provides an estimate of body fat based on height and weight. Your caregiver can help determine your BMI, and can help you achieve or maintain a healthy weight. For adults 20 years and older:   A BMI below 18.5 is considered  underweight.   A BMI of 18.5 to 24.9 is normal.   A BMI of 25 to 29.9 is considered overweight.   A BMI of 30 and above is considered obese.   Maintain normal blood lipids and cholesterol by exercising and minimizing your intake of saturated fat. Eat a balanced diet with plenty of fruits and vegetables. Blood tests for lipids and cholesterol should begin at age 70 and be repeated every 5 years. If your lipid or cholesterol levels are high, you are over 50, or you are a high risk for heart disease, you may need your cholesterol levels checked more frequently.Ongoing high lipid and cholesterol levels should be treated with medicines, if diet and exercise are not effective.   If you smoke, find out from your caregiver how to quit. If you do not use tobacco, do not start.   If you choose to drink alcohol, do not exceed 2 drinks per day. One drink is considered to be 12 ounces (355 mL) of beer, 5 ounces (148 mL) of wine, or 1.5 ounces (44 mL) of liquor.   Avoid use of street drugs. Do not share needles with anyone. Ask for help if you need support or instructions about stopping the use of drugs.   High blood pressure causes heart disease and increases the risk of stroke. Blood pressure should be checked at least every 1 to 2 years. Ongoing high blood pressure should be treated with medicines if weight loss and exercise are not effective.   If you are 21 to 48 years old, ask your caregiver if you should take aspirin to prevent heart disease.   Diabetes screening involves taking a blood sample to check your fasting blood sugar level. This should be done once every 3 years, after age 66, if you are within normal weight and without risk factors for diabetes. Testing should be considered at a younger age or be carried out more frequently if you are overweight and have at least 1 risk factor for diabetes.   Colorectal cancer can be detected and often prevented. Most routine colorectal cancer screening  begins at the age of 70 and continues through age 15. However, your caregiver may recommend screening at an earlier age if you have risk factors for colon cancer. On a yearly basis, your caregiver may provide home test kits to check for hidden blood in the stool. Use of a small camera at the end of a tube, to directly examine the colon (sigmoidoscopy or colonoscopy), can detect the earliest forms of colorectal cancer. Talk to your caregiver about this at age 29, when routine screening begins. Direct examination of the colon should be repeated every 5 to 10 years through age 33, unless early forms of pre-cancerous polyps or small growths are found.   Hepatitis C blood testing is recommended for all people born from [birth year] through 1967 and any individual with known risks for hepatitis C.   Healthy men should no longer receive  prostate-specific antigen (PSA) blood tests as part of routine cancer screening. Consult with your caregiver about prostate cancer screening.   Testicular cancer screening is not recommended for adolescents or adult males who have no symptoms. Screening includes self-exam, caregiver exam, and other screening tests. Consult with your caregiver about any symptoms you have or any concerns you have about testicular cancer.   Practice safe sex. Use condoms and avoid high-risk sexual practices to reduce the spread of sexually transmitted infections (STIs).   Use sunscreen with a sun protection factor (SPF) of 30 or greater. Apply sunscreen liberally and repeatedly throughout the day. You should seek shade when your shadow is shorter than you. Protect yourself by wearing long sleeves, pants, a wide-brimmed hat, and sunglasses year round, whenever you are outdoors.   Notify your caregiver of new moles or changes in moles, especially if there is a change in shape or color. Also notify your caregiver if a mole is larger than the size of a pencil eraser.   A one-time screening for abdominal  aortic aneurysm (AAA) and surgical repair of large AAAs by sound wave imaging (ultrasonography) is recommended for ages 61 to 75 years who are current or former smokers.   Stay current with your immunizations.  Document Released: 11/06/2007 Document Revised: 04/29/2011 Document Reviewed: 10/05/2010 [facility] Patient Information 2012 [place].

## 2012-01-19 NOTE — Assessment & Plan Note (Signed)
I will check his a1c today and will monitor his renal function

## 2012-01-19 NOTE — Assessment & Plan Note (Signed)
Exam done, vaccines were updated, labs ordered, pt ed material was given 

## 2012-01-19 NOTE — Assessment & Plan Note (Addendum)
He is doing well on his current regimen, I will check his FLP today and will address if needed

## 2012-01-19 NOTE — Progress Notes (Signed)
Subjective:    Patient ID: Jay Chen, male    DOB: [birth year], 46 y.o.   MRN: [id number]  Hypertension This is a chronic problem. The current episode started more than 1 year ago. The problem has been gradually improving since onset. The problem is controlled. Pertinent negatives include no anxiety, blurred vision, chest pain, headaches, malaise/fatigue, neck pain, orthopnea, palpitations, peripheral edema, PND, shortness of breath or sweats. Risk factors for coronary artery disease include obesity, diabetes mellitus, male gender, dyslipidemia and sedentary lifestyle. Past treatments include angiotensin blockers, calcium channel blockers, diuretics and central alpha agonists. The current treatment provides moderate improvement. Compliance problems include exercise and diet.  Hypertensive end-organ damage includes a thyroid problem. Identifiable causes of hypertension include sleep apnea.  Diabetes He presents for his follow-up diabetic visit. He has type 2 diabetes mellitus. His disease course has been stable. Pertinent negatives for hypoglycemia include no headaches, pallor or sweats. Associated symptoms include polyuria. Pertinent negatives for diabetes include no blurred vision, no chest pain, no fatigue, no foot paresthesias, no foot ulcerations, no polydipsia, no polyphagia, no visual change, no weakness and no weight loss. There are no hypoglycemic complications. Symptoms are stable. There are no diabetic complications. Current diabetic treatment includes oral agent (dual therapy). He is compliant with treatment all of the time. His weight is stable. He is following a generally unhealthy diet. Meal planning includes avoidance of concentrated sweets. He has not had a previous visit with a dietician. He never participates in exercise. There is no change in his home blood glucose trend. An ACE inhibitor/angiotensin II receptor blocker is being taken. He does not see a podiatrist.Eye exam is not  current.      Review of Systems  Constitutional: Negative for fever, chills, weight loss, malaise/fatigue, diaphoresis, activity change, appetite change, fatigue and unexpected weight change.  HENT: Negative.  Negative for neck pain.   Eyes: Negative.  Negative for blurred vision.  Respiratory: Positive for apnea. Negative for cough, choking, chest tightness, shortness of breath and stridor.   Cardiovascular: Negative for chest pain, palpitations, orthopnea, leg swelling and PND.  Gastrointestinal: Positive for constipation. Negative for nausea, vomiting, abdominal pain, diarrhea, blood in stool, abdominal distention and anal bleeding.  Genitourinary: Positive for polyuria and frequency. Negative for dysuria, urgency, hematuria, flank pain, decreased urine volume, discharge, penile swelling, scrotal swelling, enuresis, difficulty urinating, genital sores, penile pain and testicular pain.  Musculoskeletal: Negative for myalgias, back pain, joint swelling, arthralgias and gait problem.  Skin: Negative for color change, pallor, rash and wound.  Neurological: Negative.  Negative for weakness, light-headedness, numbness and headaches.  Hematological: Negative.  Negative for polydipsia, polyphagia and adenopathy. Does not bruise/bleed easily.  Psychiatric/Behavioral: Negative.        Objective:   Physical Exam  Vitals reviewed. Constitutional: He is oriented to person, place, and time. He appears well-developed and well-nourished. No distress.  HENT:  Head: Normocephalic and atraumatic.  Mouth/Throat: Oropharynx is clear and moist. No oropharyngeal exudate.  Eyes: Conjunctivae are normal. Right eye exhibits no discharge. Left eye exhibits no discharge. No scleral icterus.  Neck: Normal range of motion. Neck supple. No JVD present. No tracheal deviation present. No thyromegaly present.  Cardiovascular: Normal rate, regular rhythm, normal heart sounds and intact distal pulses.  Exam reveals no  gallop and no friction rub.   No murmur heard. Pulmonary/Chest: Effort normal and breath sounds normal. No stridor. No respiratory distress. He has no wheezes. He has no rales. He exhibits no tenderness.  Abdominal: Soft. Normal appearance and bowel sounds are normal. He exhibits no shifting dullness, no distension, no pulsatile liver, no fluid wave, no abdominal bruit, no ascites, no pulsatile midline mass and no mass. There is no hepatosplenomegaly, splenomegaly or hepatomegaly. There is no tenderness. There is no rebound, no guarding, no CVA tenderness, no tenderness at McBurney's point and negative Murphy's sign. No hernia. Hernia confirmed negative in the right inguinal area and confirmed negative in the left inguinal area.  Genitourinary: Rectum normal, prostate normal, testes normal and penis normal. Rectal exam shows no external hemorrhoid, no internal hemorrhoid, no fissure, no mass, no tenderness and anal tone normal. Guaiac negative stool. Prostate is not enlarged and not tender. Right testis shows no mass, no swelling and no tenderness. Right testis is descended. Left testis shows no swelling and no tenderness. Left testis is descended. Uncircumcised. No phimosis, paraphimosis, hypospadias, penile erythema or penile tenderness. No discharge found.  Musculoskeletal: Normal range of motion. He exhibits edema (trace edema in BLE). He exhibits no tenderness.  Lymphadenopathy:    He has no cervical adenopathy.       Right: No inguinal adenopathy present.       Left: No inguinal adenopathy present.  Neurological: He is alert and oriented to person, place, and time. He has normal reflexes. He displays normal reflexes. No cranial nerve deficit. He exhibits normal muscle tone. Coordination normal.  Skin: Skin is warm and dry. No rash noted. He is not diaphoretic. No erythema. No pallor.  Psychiatric: He has a normal mood and affect. His behavior is normal. Judgment and thought content normal.           Assessment & Plan:

## 2012-01-19 NOTE — Assessment & Plan Note (Signed)
He has adequate BP control, I will check his lytes and renal function today

## 2012-02-01 ENCOUNTER — Encounter: Payer: BC Managed Care – PPO | Attending: Internal Medicine | Admitting: *Deleted

## 2012-02-01 VITALS — Ht 68.0 in | Wt 386.4 lb

## 2012-02-01 DIAGNOSIS — E119 Type 2 diabetes mellitus without complications: Secondary | ICD-10-CM | POA: Insufficient documentation

## 2012-02-01 DIAGNOSIS — Z713 Dietary counseling and surveillance: Secondary | ICD-10-CM | POA: Insufficient documentation

## 2012-02-02 ENCOUNTER — Encounter: Payer: Self-pay | Admitting: *Deleted

## 2012-02-02 NOTE — Progress Notes (Signed)
  Patient was seen on 02/01/2012 for the first of a series of three diabetes self-management courses at the [facility].  Current A1c on 01/19/2012 = 6.8% The following learning objectives were met by the patient during this course:   Defines the role of glucose and insulin  Identifies type of diabetes and pathophysiology  Defines the diagnostic criteria for diabetes and prediabetes  States the risk factors for Type 2 Diabetes  States the symptoms of Type 2 Diabetes  Defines Type 2 Diabetes treatment goals  Defines Type 2 Diabetes treatment options  States the rationale for glucose monitoring  Identifies A1C, glucose targets, and testing times  Identifies proper sharps disposal  Defines the purpose of a diabetes food plan  Identifies carbohydrate food groups  Defines effects of carbohydrate foods on glucose levels  Identifies carbohydrate choices/grams/food labels  States benefits of physical activity and effect on glucose  Review of suggested activity guidelines  Handouts given during class include:  Type 2 Diabetes: Basics Book  My [place] and Activity Log  Follow-Up Plan: Core Class 2

## 2012-02-02 NOTE — Patient Instructions (Signed)
Goals:  Follow Diabetes Meal Plan as instructed  Eat 3 meals and 2 snacks, every 3-5 hrs  Limit carbohydrate intake to 75 grams carbohydrate/meal  Limit carbohydrate intake to 0-30 grams carbohydrate/snack  Add lean protein foods to meals/snacks  Monitor glucose levels as instructed by your doctor  Aim for 15 mins of physical activity daily  Bring food record and glucose log to your next nutrition visit

## 2012-02-07 ENCOUNTER — Other Ambulatory Visit: Payer: Self-pay | Admitting: *Deleted

## 2012-02-07 ENCOUNTER — Other Ambulatory Visit: Payer: Self-pay | Admitting: Internal Medicine

## 2012-02-07 MED ORDER — CLONIDINE HCL 0.1 MG PO TABS: ORAL_TABLET | ORAL | Status: DC

## 2012-02-07 MED ORDER — MAGNESIUM CHLORIDE 64 MG PO TBEC: 1.0000 | DELAYED_RELEASE_TABLET | Freq: Every day | ORAL | Status: DC

## 2012-02-22 ENCOUNTER — Encounter: Payer: BC Managed Care – PPO | Attending: Internal Medicine | Admitting: *Deleted

## 2012-02-22 DIAGNOSIS — Z713 Dietary counseling and surveillance: Secondary | ICD-10-CM | POA: Insufficient documentation

## 2012-02-22 DIAGNOSIS — E119 Type 2 diabetes mellitus without complications: Secondary | ICD-10-CM | POA: Insufficient documentation

## 2012-02-23 NOTE — Progress Notes (Signed)
  Patient was seen on 02/22/12 for the second of a series of three diabetes self-management courses at the [facility]. The following learning objectives were met by the patient during this course:   Explain basic nutrition maintenance and quality assurance  Describe causes, symptoms and treatment of hypoglycemia and hyperglycemia  Explain how to manage diabetes during illness  Describe the importance of good nutrition for health and healthy eating strategies  List strategies to follow meal plan when dining out  Describe the effects of alcohol on glucose and how to use it safely  Describe problem solving skills for day-to-day glucose challenges  Describe strategies to use when treatment plan needs to change  Identify important factors involved in successful weight loss  Describe ways to remain physically active  Describe the impact of regular activity on insulin resistance   Handouts given in class:  Refrigerator magnet for Sick Day Guidelines  [facility] Oral medication/insulin handout  Follow-Up Plan: Patient will attend the final class of the ADA Diabetes Self-Care Education.

## 2012-02-23 NOTE — Patient Instructions (Signed)
Goals:  Follow Diabetes Meal Plan as instructed  Eat 3 meals and 2 snacks, every 3-5 hrs  Limit carbohydrate intake to 60 grams carbohydrate/meal  Limit carbohydrate intake to 15-30 grams carbohydrate/snack  Add lean protein foods to meals/snacks  Monitor glucose levels as instructed by your doctor  Aim for 20 mins of physical activity daily  Bring food record and glucose log to your next nutrition visit

## 2012-03-07 ENCOUNTER — Other Ambulatory Visit: Payer: Self-pay | Admitting: Thoracic Surgery (Cardiothoracic Vascular Surgery)

## 2012-03-07 ENCOUNTER — Encounter: Payer: BC Managed Care – PPO | Admitting: Dietician

## 2012-03-07 DIAGNOSIS — I712 Thoracic aortic aneurysm, without rupture: Secondary | ICD-10-CM

## 2012-03-08 NOTE — Progress Notes (Signed)
  Patient was seen on 03/07/2012 for the third of a series of three diabetes self-management courses at the [facility]. The following learning objectives were met by the patient during this course:    Describe how diabetes changes over time   Identify diabetes complications and ways to prevent them   Describe strategies that can promote heart health including lowering blood pressure and cholesterol   Describe strategies to lower dietary fat and sodium in the diet   Identify physical activities that benefit cardiovascular health   Evaluate success in meeting personal goal   Describe the belief that they can live successfully with diabetes day to day   Establish 2-3 goals that they will plan to diligently work on until they return for the free 5-month follow-up visit  The following handouts were given in class:  3 Month Follow Up Visit handout  Goal setting handout  Class evaluation form  Your patient has established the following 3 month goals for diabetes self-care:  Count carbohydrates at most of my meals and snacks.  Drink less sodas  Increase my activity (for example take the stairs) at least 5 days a week.  Follow-Up Plan: Patient will attend a 3 month follow-up visit for diabetes self-management education.

## 2012-03-28 ENCOUNTER — Encounter: Payer: Self-pay | Admitting: Thoracic Surgery (Cardiothoracic Vascular Surgery)

## 2012-03-28 ENCOUNTER — Ambulatory Visit (INDEPENDENT_AMBULATORY_CARE_PROVIDER_SITE_OTHER): Payer: BC Managed Care – PPO | Admitting: Thoracic Surgery (Cardiothoracic Vascular Surgery)

## 2012-03-28 ENCOUNTER — Ambulatory Visit
Admission: RE | Admit: 2012-03-28 | Discharge: 2012-03-28 | Disposition: A | Discharge status: Home / Self Care | Payer: BC Managed Care – PPO | Source: Ambulatory Visit | Attending: Thoracic Surgery (Cardiothoracic Vascular Surgery) | Admitting: Thoracic Surgery (Cardiothoracic Vascular Surgery)

## 2012-03-28 VITALS — BP 185/114 | HR 71 | Resp 18 | Ht 68.0 in | Wt 380.0 lb

## 2012-03-28 DIAGNOSIS — I712 Thoracic aortic aneurysm, without rupture: Secondary | ICD-10-CM

## 2012-03-28 MED ORDER — IOHEXOL 350 MG/ML SOLN
100.0000 mL | Freq: Once | INTRAVENOUS | Status: AC | PRN
  Administered 2012-03-28: 100 mL via INTRAVENOUS

## 2012-03-28 NOTE — Progress Notes (Signed)
HPI:  46 year old morbidly obese male presents for one year followup of a ascending thoracic aortic aneurysm. This was originally found in the spring of 2011. I saw him in the office the first time in November of 2011, at which time the aneurysm measured 4.7 cm. He had an echocardiogram which did show mild AI and severe LVH. We have been following his aneurysm since that time.  In the interim since his last visit he says his last about 20 pounds and now weighs about 370 pounds. He says his blood pressure has been well controlled. He has a home blood pressure cuff which he uses daily. He also says his blood sugars at been well controlled. He denies any chest pain. He does get short of breath with exertion. He does require CPAP for sleep apnea.  Past Medical History  Diagnosis Date  . Congestive heart failure May of 2011    Felt to have cor pulmonale; EF 45 to 50% from echo in May 2011  . Hypertension   . Thoracic aneurysm     Followed by [name]  . Morbid obesity   . Diabetes mellitus   . Hyperlipidemia   . Sleep apnea      Current Outpatient Prescriptions  Medication Sig Dispense Refill  . ALBUTEROL IN Inhale into the lungs as needed. 2 puffs       . atorvastatin (LIPITOR) 40 MG tablet TAKE 1 TABLET BY MOUTH AT BEDTIME  30 tablet  5  . cloNIDine (CATAPRES) 0.1 MG tablet 0.1 mg each am and 0.2 mg each night.  90 tablet  6  . fenofibrate 54 MG tablet TAKE 1 TABLET BY MOUTH EVERY DAY  30 tablet  5  . fish oil-omega-3 fatty acids 1000 MG capsule Take 2 g by mouth daily.        . furosemide (LASIX) 40 MG tablet TAKE 1 TABLET DAILY  30 tablet  5  . KLOR-CON M20 20 MEQ tablet TAKE 2 TABLETS DAILY  60 each  5  . magnesium chloride (SLOW-MAG) 64 MG TBEC Take 1 tablet (64 mg total) by mouth daily.  30 tablet  5  . Olmesartan-Amlodipine-HCTZ (TRIBENZOR) 40-10-25 MG TABS Take 1 tablet by mouth daily.  90 tablet  3  . sitaGLIPtan-metformin (JANUMET) 50-500 MG per tablet Take 1 tablet by  mouth 2 (two) times daily with a meal.  60 tablet  5  . [DISCONTINUED] fenofibrate 54 MG tablet TAKE 1 TABLET BY MOUTH EVERY DAY  30 tablet  5  . [DISCONTINUED] furosemide (LASIX) 20 MG tablet Take 20 mg by mouth 2 (two) times daily.        No current facility-administered medications for this visit.   Facility-Administered Medications Ordered in Other Visits  Medication Dose Route Frequency Provider Last Rate Last Dose  . [COMPLETED] iohexol (OMNIPAQUE) 350 MG/ML injection 100 mL  100 mL Intravenous Once PRN Medication Radiologist, MD   100 mL at 03/28/12 1032    Physical Exam BP 185/114  Pulse 71  Resp 18  Ht 5\' 8"  (1.727 m)  Wt 380 lb (172.367 kg)  BMI 57.78 kg/m2  SpO2 93% Morbidly obese 46 year old male in no acute distress Neck no bruits Cardiac regular rate and rhythm normal S1 and S2, positive S4 Lungs diminished breath sounds bilaterally  Diagnostic Tests: CT angiogram of chest 03/28/2012.  Not yet officially read, aneurysm appears unchanged my measurements  Impression: 46 year old morbidly obese, diabetic male with a history of hypertension and an ascending aortic aneurysm.  This appears to be stable in size.  My main concern is his blood pressure which is markedly elevated today. He says he checks his blood pressure daily at home and it runs around 130/80 + or -10 mm Hg. he did not take his blood pressure medications morning. I once again emphasized to him the absolute vital importance of good blood pressure control. He understands that he is at risk for dissection if not rupture with an aorta this size and poor blood pressure control. I did not make any medication changes today, because he has not taken his medications morning. He assured me that he will take his blood pressure medication when he gets home, and check himself on regular basis. He will contact Dr. Stanford Breed or myself if his blood pressure continues to be elevated.  I once again emphasized the importance of  weight loss for his overall health including control of blood pressure or diabetes.  Plan: Return in one year with CT image of chest to followup aneurysm.  Patient will call if blood pressure remains elevated.

## 2012-06-06 ENCOUNTER — Ambulatory Visit: Payer: BC Managed Care – PPO | Admitting: *Deleted

## 2012-08-17 LAB — HM DIABETES EYE EXAM

## 2012-09-11 ENCOUNTER — Encounter: Payer: Self-pay | Admitting: Internal Medicine

## 2012-09-11 ENCOUNTER — Ambulatory Visit (INDEPENDENT_AMBULATORY_CARE_PROVIDER_SITE_OTHER): Payer: BC Managed Care – PPO | Admitting: Internal Medicine

## 2012-09-11 ENCOUNTER — Other Ambulatory Visit (INDEPENDENT_AMBULATORY_CARE_PROVIDER_SITE_OTHER): Payer: BC Managed Care – PPO

## 2012-09-11 VITALS — BP 142/88 | HR 67 | Temp 98.6°F | Resp 16 | Ht 68.0 in | Wt 384.4 lb

## 2012-09-11 DIAGNOSIS — M109 Gout, unspecified: Secondary | ICD-10-CM

## 2012-09-11 DIAGNOSIS — E782 Mixed hyperlipidemia: Secondary | ICD-10-CM

## 2012-09-11 DIAGNOSIS — I10 Essential (primary) hypertension: Secondary | ICD-10-CM

## 2012-09-11 DIAGNOSIS — IMO0001 Reserved for inherently not codable concepts without codable children: Secondary | ICD-10-CM

## 2012-09-11 DIAGNOSIS — E785 Hyperlipidemia, unspecified: Secondary | ICD-10-CM

## 2012-09-11 LAB — HEMOGLOBIN A1C: Hgb A1c MFr Bld: 7.9 % — ABNORMAL HIGH (ref 4.6–6.5)

## 2012-09-11 LAB — HM DIABETES FOOT EXAM: HM Diabetic Foot Exam: NORMAL

## 2012-09-11 MED ORDER — OXYCODONE-ACETAMINOPHEN 7.5-325 MG PO TABS: 1.0000 | ORAL_TABLET | ORAL | Status: DC | PRN

## 2012-09-11 MED ORDER — METHYLPREDNISOLONE 4 MG PO KIT: PACK | ORAL | Status: DC

## 2012-09-11 MED ORDER — OLMESARTAN-AMLODIPINE-HCTZ 40-10-25 MG PO TABS: 1.0000 | ORAL_TABLET | Freq: Every day | ORAL | Status: DC

## 2012-09-11 MED ORDER — SITAGLIPTIN PHOS-METFORMIN HCL 50-500 MG PO TABS: 1.0000 | ORAL_TABLET | Freq: Two times a day (BID) | ORAL | Status: DC

## 2012-09-11 MED ORDER — ATORVASTATIN CALCIUM 40 MG PO TABS: 40.0000 mg | ORAL_TABLET | Freq: Every day | ORAL | Status: DC

## 2012-09-11 MED ORDER — OMEGA-3 FATTY ACIDS 1000 MG PO CAPS: 2.0000 g | ORAL_CAPSULE | Freq: Every day | ORAL | Status: DC

## 2012-09-11 MED ORDER — COLCHICINE 0.6 MG PO TABS: 0.6000 mg | ORAL_TABLET | Freq: Two times a day (BID) | ORAL | Status: DC

## 2012-09-11 NOTE — Assessment & Plan Note (Signed)
He will continue nsaids I will check his uric acid level and renal function today Will treat the pain and swelling with colchicine, medrol,percocet

## 2012-09-11 NOTE — Assessment & Plan Note (Signed)
I will check his A1C and will advise further if needed 

## 2012-09-11 NOTE — Assessment & Plan Note (Signed)
I will check his CMP today

## 2012-09-11 NOTE — Patient Instructions (Signed)
Arthritis - Gout Gout is caused by uric acid crystals forming in the joint. The big toe, foot, ankle, and knee are the joints most often affected.Often uric acid levels in the blood are also elevated. Symptoms develop rapidly, usually over hours. A joint fluid exam may be needed to prove the diagnosis. Acute gout episodes may follow a minor injury, surgery, illness or medication change. Gout occurs more often in men. It tends to be an inherited (passed down from a family member) condition. Your chance of having gout is increased if you take certain medications. These include water pills (diuretics), aspirin, niacin, and cyclosporine. Kidney disease and alcohol consumption may also increase your chances of getting gout. Months or years can pass between gout attacks.  Treatment includes ice, rest and raising the affected limb until the swelling and pain are better.Anti-inflammatory medicine usually brings about dramatic relief of pain, redness and swelling within 2 to 3 days. Other medications can also be effective. Increase your fluid intake. Do not drink alcohol or eat liver, sweetbreads, or sardines. Long-term gout management may require medicine to lower blood uric acid levels, or changing or stopping diuretics. Please see your caregiver if your condition is not better after 1 to 2 days of treatment.  SEEK IMMEDIATE MEDICAL CARE IF:  You have more serious symptoms such as a fever, skin rash, diarrhea, vomiting, or other joint pains. Document Released: 06/17/2004 Document Revised: 04/29/2011 Document Reviewed: 07/01/2008 [facility] Patient Information 2012 [employer].

## 2012-09-11 NOTE — Progress Notes (Signed)
Subjective:    Patient ID: Jay Chen, male    DOB: 1966-04-12, 47 y.o.   MRN: [id number]  Arthritis Presents for initial visit. The disease course has been worsening. The condition has lasted for 4 days. He complains of pain, stiffness, joint swelling and joint warmth. Affected locations include the left foot. His pain is at a severity of 3/10. Associated symptoms include pain at night and pain while resting. Pertinent negatives include no diarrhea, dry eyes, dry mouth, dysuria, fatigue, fever, rash, Raynaud's syndrome, uveitis or weight loss. Past treatments include NSAIDs. The treatment provided moderate relief. Factors aggravating his arthritis include activity.      Review of Systems  Constitutional: Negative.  Negative for fever, weight loss and fatigue.  HENT: Negative.   Eyes: Negative.   Respiratory: Negative.  Negative for cough, chest tightness, shortness of breath, wheezing and stridor.   Cardiovascular: Negative.  Negative for chest pain, palpitations and leg swelling.  Gastrointestinal: Negative.  Negative for nausea, vomiting, abdominal pain, diarrhea, constipation and blood in stool.  Endocrine: Negative.  Negative for polydipsia, polyphagia and polyuria.  Genitourinary: Negative.  Negative for dysuria.  Musculoskeletal: Positive for joint swelling, arthritis and stiffness. Negative for myalgias, back pain, arthralgias and gait problem.  Skin: Negative.  Negative for color change, pallor, rash and wound.  Allergic/Immunologic: Negative.   Neurological: Negative.  Negative for dizziness, speech difficulty, weakness and light-headedness.  Hematological: Negative.  Negative for adenopathy. Does not bruise/bleed easily.  Psychiatric/Behavioral: Negative.        Objective:   Physical Exam  Vitals reviewed. Constitutional: He is oriented to person, place, and time. He appears well-developed and well-nourished. No distress.  HENT:  Head: Normocephalic and atraumatic.    Mouth/Throat: Oropharynx is clear and moist.  Eyes: Conjunctivae are normal. Right eye exhibits no discharge. Left eye exhibits no discharge. No scleral icterus.  Neck: Normal range of motion. Neck supple. No JVD present. No tracheal deviation present. No thyromegaly present.  Cardiovascular: Normal rate, regular rhythm, normal heart sounds and intact distal pulses.  Exam reveals no gallop and no friction rub.   No murmur heard. Pulmonary/Chest: Effort normal and breath sounds normal. No stridor. No respiratory distress. He has no wheezes. He has no rales. He exhibits no tenderness.  Abdominal: Soft. Bowel sounds are normal. He exhibits no distension and no mass. There is no tenderness. There is no rebound and no guarding.  Musculoskeletal: Normal range of motion. He exhibits no edema and no tenderness.       Left foot: He exhibits tenderness, bony tenderness and swelling. He exhibits normal range of motion, normal capillary refill, no crepitus, no deformity and no laceration.       Feet:  Lymphadenopathy:    He has no cervical adenopathy.  Neurological: He is oriented to person, place, and time.  Skin: Skin is warm and dry. No rash noted. He is not diaphoretic. No erythema. No pallor.  Psychiatric: He has a normal mood and affect. His behavior is normal. Judgment and thought content normal.     Lab Results  Component Value Date   WBC 8.1 01/19/2012   HGB 13.3 01/19/2012   HCT 40.5 01/19/2012   PLT 288.0 01/19/2012   GLUCOSE 107* 01/19/2012   CHOL 135 01/19/2012   TRIG 166.0* 01/19/2012   HDL 34.60* 01/19/2012   LDLCALC 67 01/19/2012   ALT 19 01/19/2012   AST 18 01/19/2012   NA 138 01/19/2012   K 3.2* 01/19/2012   CL  93* 01/19/2012   CREATININE 1.3 01/19/2012   BUN 17 01/19/2012   CO2 36* 01/19/2012   TSH 2.89 01/19/2012   PSA 0.37 01/19/2012   INR 1.01 09/19/2009   HGBA1C 6.8* 01/19/2012   MICROALBUR 0.9 01/19/2012       Assessment & Plan:

## 2012-09-11 NOTE — Assessment & Plan Note (Signed)
He is doing well on lipitor 

## 2012-09-11 NOTE — Assessment & Plan Note (Signed)
He has adequate BP control Today I will check his lytes and renal function

## 2012-09-12 ENCOUNTER — Encounter: Payer: Self-pay | Admitting: Internal Medicine

## 2012-09-12 LAB — COMPREHENSIVE METABOLIC PANEL
ALT: 14 U/L (ref 0–53)
Alkaline Phosphatase: 55 U/L (ref 39–117)
Sodium: 139 mEq/L (ref 135–145)
Total Bilirubin: 0.8 mg/dL (ref 0.3–1.2)
Total Protein: 7.1 g/dL (ref 6.0–8.3)

## 2012-09-12 LAB — LIPID PANEL
HDL: 28.2 mg/dL — ABNORMAL LOW (ref >39.00)
LDL Cholesterol: 82 mg/dL (ref 0–99)
Total CHOL/HDL Ratio: 5
VLDL: 25 mg/dL (ref 0.0–40.0)

## 2012-09-12 MED ORDER — OMEGA-3 FATTY ACIDS 1000 MG PO CAPS: 2.0000 g | ORAL_CAPSULE | Freq: Every day | ORAL | Status: DC

## 2012-09-12 MED ORDER — OMEGA-3-ACID ETHYL ESTERS 1 G PO CAPS: 2.0000 g | ORAL_CAPSULE | Freq: Two times a day (BID) | ORAL | Status: DC

## 2012-09-12 NOTE — Addendum Note (Signed)
Addended by: [name] on: 09/12/2012 09:04 AM   Modules accepted: Orders, Medications

## 2012-09-12 NOTE — Addendum Note (Signed)
Addended by: [name] on: 09/12/2012 09:01 AM   Modules accepted: Orders

## 2012-09-21 ENCOUNTER — Other Ambulatory Visit: Payer: Self-pay | Admitting: Internal Medicine

## 2012-09-28 ENCOUNTER — Ambulatory Visit (INDEPENDENT_AMBULATORY_CARE_PROVIDER_SITE_OTHER): Payer: BC Managed Care – PPO | Admitting: Internal Medicine

## 2012-09-28 ENCOUNTER — Encounter: Payer: Self-pay | Admitting: Internal Medicine

## 2012-09-28 VITALS — BP 140/90 | HR 85 | Temp 98.2°F | Resp 16 | Ht 68.0 in | Wt 385.0 lb

## 2012-09-28 DIAGNOSIS — E785 Hyperlipidemia, unspecified: Secondary | ICD-10-CM

## 2012-09-28 DIAGNOSIS — I10 Essential (primary) hypertension: Secondary | ICD-10-CM

## 2012-09-28 LAB — GLUCOSE, POCT (MANUAL RESULT ENTRY): POC Glucose: 144 mg/dl — AB (ref 70–99)

## 2012-09-28 MED ORDER — CANAGLIFLOZIN 300 MG PO TABS: 1.0000 | ORAL_TABLET | Freq: Every day | ORAL | Status: DC

## 2012-09-28 MED ORDER — UNISTIK 1 MISC: Status: DC

## 2012-09-28 MED ORDER — GLUCOSE BLOOD VI STRP: ORAL_STRIP | Status: DC

## 2012-09-28 NOTE — Progress Notes (Signed)
Subjective:    Patient ID: Jay Chen, male    DOB: Aug 31, 1965, 47 y.o.   MRN: [id number]  Diabetes He presents for his follow-up diabetic visit. He has type 2 diabetes mellitus. His disease course has been worsening. There are no hypoglycemic associated symptoms. Associated symptoms include polydipsia, polyphagia and polyuria. Pertinent negatives for diabetes include no blurred vision, no chest pain, no fatigue, no foot paresthesias, no foot ulcerations, no visual change, no weakness and no weight loss. There are no hypoglycemic complications. Symptoms are stable. There are no diabetic complications. There are no known risk factors for coronary artery disease. Current diabetic treatment includes oral agent (dual therapy). He is compliant with treatment all of the time. His weight is stable. He is following a generally unhealthy diet. When asked about meal planning, he reported none. He never participates in exercise. His home blood glucose trend is increasing steadily. An ACE inhibitor/angiotensin II receptor blocker is being taken. He does not see a podiatrist.Eye exam is current.      Review of Systems  Constitutional: Negative.  Negative for weight loss and fatigue.  HENT: Negative.   Eyes: Negative.  Negative for blurred vision.  Respiratory: Negative.   Cardiovascular: Negative.  Negative for chest pain.  Gastrointestinal: Negative.   Endocrine: Positive for polydipsia, polyphagia and polyuria.  Musculoskeletal: Negative.   Skin: Negative.   Allergic/Immunologic: Negative.   Neurological: Negative.  Negative for weakness.  Hematological: Negative.   Psychiatric/Behavioral: Negative.        Objective:   Physical Exam  Vitals reviewed. Constitutional: He is oriented to person, place, and time. He appears well-developed and well-nourished. No distress.  HENT:  Head: Normocephalic and atraumatic.  Mouth/Throat: Oropharynx is clear and moist. No oropharyngeal exudate.  Eyes:  Conjunctivae are normal. Right eye exhibits no discharge. Left eye exhibits no discharge. No scleral icterus.  Neck: Normal range of motion. Neck supple. No JVD present. No tracheal deviation present. No thyromegaly present.  Cardiovascular: Normal rate, regular rhythm, normal heart sounds and intact distal pulses.  Exam reveals no gallop and no friction rub.   No murmur heard. Pulmonary/Chest: Effort normal and breath sounds normal. No stridor. No respiratory distress. He has no wheezes. He has no rales. He exhibits no tenderness.  Abdominal: Soft. Bowel sounds are normal. He exhibits no distension and no mass. There is no tenderness. There is no rebound and no guarding.  Musculoskeletal: Normal range of motion. He exhibits no edema and no tenderness.  Lymphadenopathy:    He has no cervical adenopathy.  Neurological: He is oriented to person, place, and time.  Skin: Skin is warm and dry. No rash noted. He is not diaphoretic. No erythema. No pallor.  Psychiatric: He has a normal mood and affect. His behavior is normal. Judgment and thought content normal.     Lab Results  Component Value Date   WBC 8.1 01/19/2012   HGB 13.3 01/19/2012   HCT 40.5 01/19/2012   PLT 288.0 01/19/2012   GLUCOSE 121* 09/11/2012   CHOL 135 09/11/2012   TRIG 125.0 09/11/2012   HDL 28.20* 09/11/2012   LDLCALC 82 09/11/2012   ALT 14 09/11/2012   AST 14 09/11/2012   NA 139 09/11/2012   K 3.8 09/11/2012   CL 97 09/11/2012   CREATININE 1.3 09/11/2012   BUN 14 09/11/2012   CO2 36* 09/11/2012   TSH 2.89 01/19/2012   PSA 0.37 01/19/2012   INR 1.01 09/19/2009   HGBA1C 7.9* 09/11/2012  MICROALBUR 0.9 01/19/2012       Assessment & Plan:

## 2012-09-28 NOTE — Patient Instructions (Signed)

## 2012-09-28 NOTE — Assessment & Plan Note (Signed)
Goal achieved 

## 2012-09-28 NOTE — Assessment & Plan Note (Signed)
He has adequate BP control 

## 2012-09-28 NOTE — Assessment & Plan Note (Signed)
His A1C is still high so I have asked him to add invokana to the janumet

## 2012-10-24 ENCOUNTER — Other Ambulatory Visit: Payer: Self-pay | Admitting: Internal Medicine

## 2012-12-03 ENCOUNTER — Other Ambulatory Visit: Payer: Self-pay | Admitting: Internal Medicine

## 2013-01-22 ENCOUNTER — Other Ambulatory Visit: Payer: Self-pay | Admitting: Internal Medicine

## 2013-02-27 ENCOUNTER — Other Ambulatory Visit: Payer: Self-pay | Admitting: Internal Medicine

## 2013-03-21 ENCOUNTER — Other Ambulatory Visit: Payer: Self-pay

## 2013-03-21 DIAGNOSIS — I712 Thoracic aortic aneurysm, without rupture: Secondary | ICD-10-CM

## 2013-03-29 ENCOUNTER — Other Ambulatory Visit: Payer: Self-pay

## 2013-03-30 ENCOUNTER — Other Ambulatory Visit: Payer: Self-pay | Admitting: *Deleted

## 2013-04-03 ENCOUNTER — Other Ambulatory Visit: Payer: Self-pay | Admitting: *Deleted

## 2013-04-03 ENCOUNTER — Inpatient Hospital Stay: Admission: RE | Admit: 2013-04-03 | Payer: BC Managed Care – PPO | Source: Ambulatory Visit

## 2013-04-03 ENCOUNTER — Ambulatory Visit: Payer: BC Managed Care – PPO | Admitting: Thoracic Surgery (Cardiothoracic Vascular Surgery)

## 2013-04-03 DIAGNOSIS — IMO0001 Reserved for inherently not codable concepts without codable children: Secondary | ICD-10-CM

## 2013-04-03 DIAGNOSIS — I712 Thoracic aortic aneurysm, without rupture: Secondary | ICD-10-CM

## 2013-04-06 ENCOUNTER — Encounter: Payer: BC Managed Care – PPO | Admitting: Thoracic Surgery (Cardiothoracic Vascular Surgery)

## 2013-04-09 ENCOUNTER — Other Ambulatory Visit: Payer: Self-pay | Admitting: Internal Medicine

## 2013-04-24 ENCOUNTER — Other Ambulatory Visit: Payer: BC Managed Care – PPO

## 2013-04-24 ENCOUNTER — Ambulatory Visit: Payer: BC Managed Care – PPO | Admitting: Thoracic Surgery (Cardiothoracic Vascular Surgery)

## 2013-05-08 ENCOUNTER — Other Ambulatory Visit: Payer: BC Managed Care – PPO

## 2013-05-08 ENCOUNTER — Ambulatory Visit: Payer: BC Managed Care – PPO | Admitting: Thoracic Surgery (Cardiothoracic Vascular Surgery)

## 2013-05-23 ENCOUNTER — Other Ambulatory Visit: Payer: Self-pay | Admitting: Internal Medicine

## 2013-07-08 ENCOUNTER — Other Ambulatory Visit: Payer: Self-pay | Admitting: Internal Medicine

## 2013-07-16 ENCOUNTER — Ambulatory Visit: Payer: BC Managed Care – PPO | Admitting: Internal Medicine

## 2013-07-16 DIAGNOSIS — Z0289 Encounter for other administrative examinations: Secondary | ICD-10-CM

## 2013-07-17 ENCOUNTER — Ambulatory Visit: Payer: BC Managed Care – PPO | Admitting: Internal Medicine

## 2013-07-18 ENCOUNTER — Ambulatory Visit (INDEPENDENT_AMBULATORY_CARE_PROVIDER_SITE_OTHER): Payer: BC Managed Care – PPO | Admitting: Internal Medicine

## 2013-07-18 ENCOUNTER — Encounter: Payer: Self-pay | Admitting: Internal Medicine

## 2013-07-18 ENCOUNTER — Other Ambulatory Visit (INDEPENDENT_AMBULATORY_CARE_PROVIDER_SITE_OTHER): Payer: BC Managed Care – PPO

## 2013-07-18 VITALS — BP 140/96 | HR 90 | Temp 98.5°F | Resp 16 | Ht 68.0 in | Wt 390.0 lb

## 2013-07-18 DIAGNOSIS — E782 Mixed hyperlipidemia: Secondary | ICD-10-CM

## 2013-07-18 DIAGNOSIS — E876 Hypokalemia: Secondary | ICD-10-CM | POA: Insufficient documentation

## 2013-07-18 DIAGNOSIS — I10 Essential (primary) hypertension: Secondary | ICD-10-CM

## 2013-07-18 DIAGNOSIS — E1165 Type 2 diabetes mellitus with hyperglycemia: Secondary | ICD-10-CM

## 2013-07-18 DIAGNOSIS — E785 Hyperlipidemia, unspecified: Secondary | ICD-10-CM

## 2013-07-18 DIAGNOSIS — IMO0001 Reserved for inherently not codable concepts without codable children: Secondary | ICD-10-CM

## 2013-07-18 DIAGNOSIS — M109 Gout, unspecified: Secondary | ICD-10-CM

## 2013-07-18 LAB — CBC WITH DIFFERENTIAL/PLATELET
BASOS ABS: 0 10*3/uL (ref 0.0–0.1)
BASOS PCT: 0.6 % (ref 0.0–3.0)
Eosinophils Absolute: 0.2 10*3/uL (ref 0.0–0.7)
Eosinophils Relative: 3.2 % (ref 0.0–5.0)
HCT: 42 % (ref 39.0–52.0)
Hemoglobin: 13.7 g/dL (ref 13.0–17.0)
LYMPHS PCT: 36.2 % (ref 12.0–46.0)
Lymphs Abs: 2.1 10*3/uL (ref 0.7–4.0)
MCHC: 32.6 g/dL (ref 30.0–36.0)
MCV: 95.8 fl (ref 78.0–100.0)
Monocytes Absolute: 0.5 10*3/uL (ref 0.1–1.0)
Monocytes Relative: 9 % (ref 3.0–12.0)
NEUTROS PCT: 51 % (ref 43.0–77.0)
Neutro Abs: 3 10*3/uL (ref 1.4–7.7)
Platelets: 311 10*3/uL (ref 150.0–400.0)
RBC: 4.39 Mil/uL (ref 4.22–5.81)
RDW: 14.5 % (ref 11.5–14.6)
WBC: 5.9 10*3/uL (ref 4.5–10.5)

## 2013-07-18 LAB — COMPREHENSIVE METABOLIC PANEL
ALT: 19 U/L (ref 0–53)
AST: 15 U/L (ref 0–37)
Albumin: 3.6 g/dL (ref 3.5–5.2)
Alkaline Phosphatase: 56 U/L (ref 39–117)
BUN: 15 mg/dL (ref 6–23)
CALCIUM: 8.8 mg/dL (ref 8.4–10.5)
CHLORIDE: 97 meq/L (ref 96–112)
CO2: 36 meq/L — AB (ref 19–32)
Creatinine, Ser: 1.2 mg/dL (ref 0.4–1.5)
GFR: 86.75 mL/min (ref >60.00)
Glucose, Bld: 106 mg/dL — ABNORMAL HIGH (ref 70–99)
POTASSIUM: 3.2 meq/L — AB (ref 3.5–5.1)
Sodium: 140 mEq/L (ref 135–145)
Total Bilirubin: 0.6 mg/dL (ref 0.3–1.2)
Total Protein: 7.1 g/dL (ref 6.0–8.3)

## 2013-07-18 LAB — LIPID PANEL
CHOL/HDL RATIO: 4
Cholesterol: 133 mg/dL (ref 0–200)
HDL: 33 mg/dL — AB (ref >39.00)
LDL CALC: 62 mg/dL (ref 0–99)
Triglycerides: 192 mg/dL — ABNORMAL HIGH (ref 0.0–149.0)
VLDL: 38.4 mg/dL (ref 0.0–40.0)

## 2013-07-18 LAB — TSH: TSH: 2.54 u[IU]/mL (ref 0.35–5.50)

## 2013-07-18 LAB — HEMOGLOBIN A1C: Hgb A1c MFr Bld: 7.8 % — ABNORMAL HIGH (ref 4.6–6.5)

## 2013-07-18 MED ORDER — FUROSEMIDE 40 MG PO TABS: ORAL_TABLET | ORAL | Status: DC

## 2013-07-18 MED ORDER — COLCHICINE 0.6 MG PO TABS: 0.6000 mg | ORAL_TABLET | Freq: Two times a day (BID) | ORAL | Status: DC

## 2013-07-18 MED ORDER — OMEGA-3-ACID ETHYL ESTERS 1 G PO CAPS: 2.0000 g | ORAL_CAPSULE | Freq: Two times a day (BID) | ORAL | Status: DC

## 2013-07-18 MED ORDER — SITAGLIPTIN PHOS-METFORMIN HCL 50-500 MG PO TABS
1.0000 | ORAL_TABLET | Freq: Two times a day (BID) | ORAL | Status: DC
Start: 2013-07-18 — End: 2016-10-12

## 2013-07-18 MED ORDER — FENOFIBRATE 54 MG PO TABS: 54.0000 mg | ORAL_TABLET | Freq: Every day | ORAL | Status: DC

## 2013-07-18 MED ORDER — CANAGLIFLOZIN 300 MG PO TABS: 1.0000 | ORAL_TABLET | Freq: Every day | ORAL | Status: DC

## 2013-07-18 MED ORDER — ATORVASTATIN CALCIUM 40 MG PO TABS: 40.0000 mg | ORAL_TABLET | Freq: Every day | ORAL | Status: DC

## 2013-07-18 MED ORDER — POTASSIUM CHLORIDE CRYS ER 20 MEQ PO TBCR: EXTENDED_RELEASE_TABLET | ORAL | Status: DC

## 2013-07-18 MED ORDER — OLMESARTAN-AMLODIPINE-HCTZ 40-10-25 MG PO TABS: 1.0000 | ORAL_TABLET | Freq: Every day | ORAL | Status: DC

## 2013-07-18 NOTE — Progress Notes (Signed)
Pre visit review using our clinic review tool, if applicable. No additional management support is needed unless otherwise documented below in the visit note. 

## 2013-07-18 NOTE — Assessment & Plan Note (Signed)
Goal achieved 

## 2013-07-18 NOTE — Progress Notes (Signed)
   Subjective:    Patient ID: Jay Chen, male    DOB: August 12, 1965, 48 y.o.   MRN: [id number]  HPI Comments: He returns for f/up and he tells me that he has not been very compliant with his meds but he feels to be at his baseline with an unchanged level of SOB and fatigue.  Diabetes He presents for his follow-up diabetic visit. He has type 2 diabetes mellitus. His disease course has been stable. There are no hypoglycemic associated symptoms. Associated symptoms include fatigue. Pertinent negatives for diabetes include no blurred vision, no chest pain, no foot paresthesias, no foot ulcerations, no polydipsia, no polyphagia, no polyuria, no visual change, no weakness and no weight loss. There are no hypoglycemic complications. Symptoms are stable. There are no diabetic complications. Current diabetic treatment includes oral agent (triple therapy). He is compliant with treatment some of the time. His weight is increasing steadily. He is following a generally unhealthy diet. Meal planning includes avoidance of concentrated sweets. He has not had a previous visit with a dietician. He never participates in exercise. There is no change in his home blood glucose trend. His breakfast blood glucose range is generally 110-130 mg/dl. His lunch blood glucose range is generally 130-140 mg/dl. His dinner blood glucose range is generally 130-140 mg/dl. His highest blood glucose is 140-180 mg/dl. His overall blood glucose range is 130-140 mg/dl. An ACE inhibitor/angiotensin II receptor blocker is being taken. He does not see a podiatrist.Eye exam is not current.      Review of Systems  Constitutional: Positive for fatigue. Negative for fever, chills, weight loss, diaphoresis, appetite change and unexpected weight change.  HENT: Negative.   Eyes: Negative.  Negative for blurred vision.  Respiratory: Positive for apnea and shortness of breath. Negative for cough, choking, chest tightness, wheezing and stridor.     Cardiovascular: Negative.  Negative for chest pain, palpitations and leg swelling.  Gastrointestinal: Negative.  Negative for nausea, vomiting, abdominal pain, diarrhea, constipation and blood in stool.  Endocrine: Negative.  Negative for polydipsia, polyphagia and polyuria.  Genitourinary: Negative.   Musculoskeletal: Negative.  Negative for arthralgias, back pain, joint swelling, myalgias and neck stiffness.  Skin: Negative.   Allergic/Immunologic: Negative.   Neurological: Negative.  Negative for weakness.  Hematological: Negative.  Negative for adenopathy. Does not bruise/bleed easily.  Psychiatric/Behavioral: Negative.        Objective:   Physical Exam  Constitutional:  Non-toxic appearance. He does not have a sickly appearance. He does not appear ill. No distress.  Morbidly obese and pickwickian appearance     Lab Results  Component Value Date   WBC 8.1 01/19/2012   HGB 13.3 01/19/2012   HCT 40.5 01/19/2012   PLT 288.0 01/19/2012   GLUCOSE 121* 09/11/2012   CHOL 135 09/11/2012   TRIG 125.0 09/11/2012   HDL 28.20* 09/11/2012   LDLCALC 82 09/11/2012   ALT 14 09/11/2012   AST 14 09/11/2012   NA 139 09/11/2012   K 3.8 09/11/2012   CL 97 09/11/2012   CREATININE 1.3 09/11/2012   BUN 14 09/11/2012   CO2 36* 09/11/2012   TSH 2.89 01/19/2012   PSA 0.37 01/19/2012   INR 1.01 09/19/2009   HGBA1C 7.9* 09/11/2012   MICROALBUR 0.9 01/19/2012       Assessment & Plan:

## 2013-07-18 NOTE — Assessment & Plan Note (Signed)
K+ = 3.2 today. He will restart the K replacement therapy.

## 2013-07-18 NOTE — Patient Instructions (Signed)
Type 2 Diabetes Mellitus, Adult Type 2 diabetes mellitus, often simply referred to as type 2 diabetes, is a long-lasting (chronic) disease. In type 2 diabetes, the pancreas does not make enough insulin (a hormone), the cells are less responsive to the insulin that is made (insulin resistance), or both. Normally, insulin moves sugars from food into the tissue cells. The tissue cells use the sugars for energy. The lack of insulin or the lack of normal response to insulin causes excess sugars to build up in the blood instead of going into the tissue cells. As a result, high blood sugar (hyperglycemia) develops. The effect of high sugar (glucose) levels can cause many complications. Type 2 diabetes was also previously called adult-onset diabetes but it can occur at any age.  RISK FACTORS  A person is predisposed to developing type 2 diabetes if someone in the family has the disease and also has one or more of the following primary risk factors:  Overweight.  An inactive lifestyle.  A history of consistently eating high-calorie foods. Maintaining a normal weight and regular physical activity can reduce the chance of developing type 2 diabetes. SYMPTOMS  A person with type 2 diabetes may not show symptoms initially. The symptoms of type 2 diabetes appear slowly. The symptoms include:  Increased thirst (polydipsia).  Increased urination (polyuria).  Increased urination during the night (nocturia).  Weight loss. This weight loss may be rapid.  Frequent, recurring infections.  Tiredness (fatigue).  Weakness.  Vision changes, such as blurred vision.  Fruity smell to your breath.  Abdominal pain.  Nausea or vomiting.  Cuts or bruises which are slow to heal.  Tingling or numbness in the hands or feet. DIAGNOSIS Type 2 diabetes is frequently not diagnosed until complications of diabetes are present. Type 2 diabetes is diagnosed when symptoms or complications are present and when blood  glucose levels are increased. Your blood glucose level may be checked by one or more of the following blood tests:  A fasting blood glucose test. You will not be allowed to eat for at least 8 hours before a blood sample is taken.  A random blood glucose test. Your blood glucose is checked at any time of the day regardless of when you ate.  A hemoglobin A1c blood glucose test. A hemoglobin A1c test provides information about blood glucose control over the previous 3 months.  An oral glucose tolerance test (OGTT). Your blood glucose is measured after you have not eaten (fasted) for 2 hours and then after you drink a glucose-containing beverage. TREATMENT   You may need to take insulin or diabetes medicine daily to keep blood glucose levels in the desired range.  You will need to match insulin dosing with exercise and healthy food choices. The treatment goal is to maintain the before meal blood sugar (preprandial glucose) level at 70 130 mg/dL. HOME CARE INSTRUCTIONS   Have your hemoglobin A1c level checked twice a year.  Perform daily blood glucose monitoring as directed by your caregiver.  Monitor urine ketones when you are ill and as directed by your caregiver.  Take your diabetes medicine or insulin as directed by your caregiver to maintain your blood glucose levels in the desired range.  Never run out of diabetes medicine or insulin. It is needed every day.  Adjust insulin based on your intake of carbohydrates. Carbohydrates can raise blood glucose levels but need to be included in your diet. Carbohydrates provide vitamins, minerals, and fiber which are an essential part of   a healthy diet. Carbohydrates are found in fruits, vegetables, whole grains, dairy products, legumes, and foods containing added sugars.    Eat healthy foods. Alternate 3 meals with 3 snacks.  Lose weight if overweight.  Carry a medical alert card or wear your medical alert jewelry.  Carry a 15 gram  carbohydrate snack with you at all times to treat low blood glucose (hypoglycemia). Some examples of 15 gram carbohydrate snacks include:  Glucose tablets, 3 or 4   Glucose gel, 15 gram tube  Raisins, 2 tablespoons (24 grams)  Jelly beans, 6  Animal crackers, 8  Regular pop, 4 ounces (120 mL)  Gummy treats, 9  Recognize hypoglycemia. Hypoglycemia occurs with blood glucose levels of 70 mg/dL and below. The risk for hypoglycemia increases when fasting or skipping meals, during or after intense exercise, and during sleep. Hypoglycemia symptoms can include:  Tremors or shakes.  Decreased ability to concentrate.  Sweating.  Increased heart rate.  Headache.  Dry mouth.  Hunger.  Irritability.  Anxiety.  Restless sleep.  Altered speech or coordination.  Confusion.  Treat hypoglycemia promptly. If you are alert and able to safely swallow, follow the 15:15 rule:  Take 15 20 grams of rapid-acting glucose or carbohydrate. Rapid-acting options include glucose gel, glucose tablets, or 4 ounces (120 mL) of fruit juice, regular soda, or low fat milk.  Check your blood glucose level 15 minutes after taking the glucose.  Take 15 20 grams more of glucose if the repeat blood glucose level is still 70 mg/dL or below.  Eat a meal or snack within 1 hour once blood glucose levels return to normal.    Be alert to polyuria and polydipsia which are early signs of hyperglycemia. An early awareness of hyperglycemia allows for prompt treatment. Treat hyperglycemia as directed by your caregiver.  Engage in at least 150 minutes of moderate-intensity physical activity a week, spread over at least 3 days of the week or as directed by your caregiver. In addition, you should engage in resistance exercise at least 2 times a week or as directed by your caregiver.  Adjust your medicine and food intake as needed if you start a new exercise or sport.  Follow your sick day plan at any time you  are unable to eat or drink as usual.  Avoid tobacco use.  Limit alcohol intake to no more than 1 drink per day for nonpregnant women and 2 drinks per day for men. You should drink alcohol only when you are also eating food. Talk with your caregiver whether alcohol is safe for you. Tell your caregiver if you drink alcohol several times a week.  Follow up with your caregiver regularly.  Schedule an eye exam soon after the diagnosis of type 2 diabetes and then annually.  Perform daily skin and foot care. Examine your skin and feet daily for cuts, bruises, redness, nail problems, bleeding, blisters, or sores. A foot exam by a caregiver should be done annually.  Brush your teeth and gums at least twice a day and floss at least once a day. Follow up with your dentist regularly.  Share your diabetes management plan with your workplace or school.  Stay up-to-date with immunizations.  Learn to manage stress.  Obtain ongoing diabetes education and support as needed.  Participate in, or seek rehabilitation as needed to maintain or improve independence and quality of life. Request a physical or occupational therapy referral if you are having foot or hand numbness or difficulties with grooming,   dressing, eating, or physical activity. SEEK MEDICAL CARE IF:   You are unable to eat food or drink fluids for more than 6 hours.  You have nausea and vomiting for more than 6 hours.  Your blood glucose level is over 240 mg/dL.  There is a change in mental status.  You develop an additional serious illness.  You have diarrhea for more than 6 hours.  You have been sick or have had a fever for a couple of days and are not getting better.  You have pain during any physical activity.  SEEK IMMEDIATE MEDICAL CARE IF:  You have difficulty breathing.  You have moderate to large ketone levels. MAKE SURE YOU:  Understand these instructions.  Will watch your condition.  Will get help right away if  you are not doing well or get worse. Document Released: 05/10/2005 Document Revised: 02/02/2012 Document Reviewed: 12/07/2011 [employer] Patient Information 2014 [employer].  

## 2013-07-18 NOTE — Assessment & Plan Note (Signed)
His BP is not well controlled He will restart tribenzor I will recheck his lytes and renal function today

## 2013-07-18 NOTE — Assessment & Plan Note (Signed)
His A1C is a little too high - he agrees to be more compliant with his meds, I gave him samples today

## 2013-08-26 ENCOUNTER — Other Ambulatory Visit: Payer: Self-pay | Admitting: Internal Medicine

## 2013-11-14 ENCOUNTER — Ambulatory Visit: Payer: BC Managed Care – PPO | Admitting: Internal Medicine

## 2013-11-22 ENCOUNTER — Encounter: Payer: Self-pay | Admitting: Internal Medicine

## 2013-11-22 ENCOUNTER — Other Ambulatory Visit (INDEPENDENT_AMBULATORY_CARE_PROVIDER_SITE_OTHER): Payer: BC Managed Care – PPO

## 2013-11-22 ENCOUNTER — Ambulatory Visit (INDEPENDENT_AMBULATORY_CARE_PROVIDER_SITE_OTHER): Payer: BC Managed Care – PPO | Admitting: Internal Medicine

## 2013-11-22 VITALS — BP 140/88 | HR 86 | Temp 98.3°F | Resp 16 | Ht 68.0 in | Wt 375.0 lb

## 2013-11-22 DIAGNOSIS — E1165 Type 2 diabetes mellitus with hyperglycemia: Principal | ICD-10-CM

## 2013-11-22 DIAGNOSIS — I10 Essential (primary) hypertension: Secondary | ICD-10-CM

## 2013-11-22 DIAGNOSIS — E876 Hypokalemia: Secondary | ICD-10-CM

## 2013-11-22 DIAGNOSIS — IMO0001 Reserved for inherently not codable concepts without codable children: Secondary | ICD-10-CM

## 2013-11-22 DIAGNOSIS — G4730 Sleep apnea, unspecified: Secondary | ICD-10-CM

## 2013-11-22 LAB — BASIC METABOLIC PANEL
BUN: 14 mg/dL (ref 6–23)
CALCIUM: 9.4 mg/dL (ref 8.4–10.5)
CO2: 39 meq/L — AB (ref 19–32)
Chloride: 98 mEq/L (ref 96–112)
Creatinine, Ser: 1.4 mg/dL (ref 0.4–1.5)
GFR: 69.04 mL/min (ref >60.00)
Glucose, Bld: 157 mg/dL — ABNORMAL HIGH (ref 70–99)
Potassium: 3.6 mEq/L (ref 3.5–5.1)
SODIUM: 141 meq/L (ref 135–145)

## 2013-11-22 LAB — HEMOGLOBIN A1C: Hgb A1c MFr Bld: 7.9 % — ABNORMAL HIGH (ref 4.6–6.5)

## 2013-11-22 NOTE — Patient Instructions (Signed)
Type 2 Diabetes Mellitus, Adult Type 2 diabetes mellitus, often simply referred to as type 2 diabetes, is a long-lasting (chronic) disease. In type 2 diabetes, the pancreas does not make enough insulin (a hormone), the cells are less responsive to the insulin that is made (insulin resistance), or both. Normally, insulin moves sugars from food into the tissue cells. The tissue cells use the sugars for energy. The lack of insulin or the lack of normal response to insulin causes excess sugars to build up in the blood instead of going into the tissue cells. As a result, high blood sugar (hyperglycemia) develops. The effect of high sugar (glucose) levels can cause many complications. Type 2 diabetes was also previously called adult-onset diabetes but it can occur at any age.  RISK FACTORS  A person is predisposed to developing type 2 diabetes if someone in the family has the disease and also has one or more of the following primary risk factors:  Overweight.  An inactive lifestyle.  A history of consistently eating high-calorie foods. Maintaining a normal weight and regular physical activity can reduce the chance of developing type 2 diabetes. SYMPTOMS  A person with type 2 diabetes may not show symptoms initially. The symptoms of type 2 diabetes appear slowly. The symptoms include:  Increased thirst (polydipsia).  Increased urination (polyuria).  Increased urination during the night (nocturia).  Weight loss. This weight loss may be rapid.  Frequent, recurring infections.  Tiredness (fatigue).  Weakness.  Vision changes, such as blurred vision.  Fruity smell to your breath.  Abdominal pain.  Nausea or vomiting.  Cuts or bruises which are slow to heal.  Tingling or numbness in the hands or feet. DIAGNOSIS Type 2 diabetes is frequently not diagnosed until complications of diabetes are present. Type 2 diabetes is diagnosed when symptoms or complications are present and when blood  glucose levels are increased. Your blood glucose level may be checked by one or more of the following blood tests:  A fasting blood glucose test. You will not be allowed to eat for at least 8 hours before a blood sample is taken.  A random blood glucose test. Your blood glucose is checked at any time of the day regardless of when you ate.  A hemoglobin A1c blood glucose test. A hemoglobin A1c test provides information about blood glucose control over the previous 3 months.  An oral glucose tolerance test (OGTT). Your blood glucose is measured after you have not eaten (fasted) for 2 hours and then after you drink a glucose-containing beverage. TREATMENT   You may need to take insulin or diabetes medicine daily to keep blood glucose levels in the desired range.  If you use insulin, you may need to adjust the dosage depending on the carbohydrates that you eat with each meal or snack. The treatment goal is to maintain the before meal blood sugar (preprandial glucose) level at 70-130 mg/dL. HOME CARE INSTRUCTIONS   Have your hemoglobin A1c level checked twice a year.  Perform daily blood glucose monitoring as directed by your health care provider.  Monitor urine ketones when you are ill and as directed by your health care provider.  Take your diabetes medicine or insulin as directed by your health care provider to maintain your blood glucose levels in the desired range.  Never run out of diabetes medicine or insulin. It is needed every day.  If you are using insulin, you may need to adjust the amount of insulin given based on your intake   of carbohydrates. Carbohydrates can raise blood glucose levels but need to be included in your diet. Carbohydrates provide vitamins, minerals, and fiber which are an essential part of a healthy diet. Carbohydrates are found in fruits, vegetables, whole grains, dairy products, legumes, and foods containing added sugars.  Eat healthy foods. You should make an  appointment to see a registered dietitian to help you create an eating plan that is right for you.  Lose weight if overweight.  Carry a medical alert card or wear your medical alert jewelry.  Carry a 15 gram carbohydrate snack with you at all times to treat low blood glucose (hypoglycemia). Some examples of 15 gram carbohydrate snacks include:  Glucose tablets, 3 or 4  Raisins, 2 tablespoons (24 grams)  Jelly beans, 6  Animal crackers, 8  Regular pop, 4 ounces (120 mL)  Gummy treats, 9  Recognize hypoglycemia. Hypoglycemia occurs with blood glucose levels of 70 mg/dL and below. The risk for hypoglycemia increases when fasting or skipping meals, during or after intense exercise, and during sleep. Hypoglycemia symptoms can include:  Tremors or shakes.  Decreased ability to concentrate.  Sweating.  Increased heart rate.  Headache.  Dry mouth.  Hunger.  Irritability.  Anxiety.  Restless sleep.  Altered speech or coordination.  Confusion.  Treat hypoglycemia promptly. If you are alert and able to safely swallow, follow the 15:15 rule:  Take 15-20 grams of rapid-acting glucose or carbohydrate. Rapid-acting options include glucose gel, glucose tablets, or 4 ounces (120 mL) of fruit juice, regular soda, or low fat milk.  Check your blood glucose level 15 minutes after taking the glucose.  Take 15-20 grams more of glucose if the repeat blood glucose level is still 70 mg/dL or below.  Eat a meal or snack within 1 hour once blood glucose levels return to normal.  Be alert to feeling very thirsty and urinating more frequently than usual, which are early signs of hyperglycemia. An early awareness of hyperglycemia allows for prompt treatment. Treat hyperglycemia as directed by your health care provider.  Engage in at least 150 minutes of moderate-intensity physical activity a week, spread over at least 3 days of the week or as directed by your health care provider. In  addition, you should engage in resistance exercise at least 2 times a week or as directed by your health care provider.  Adjust your medicine and food intake as needed if you start a new exercise or sport.  Follow your sick day plan at any time you are unable to eat or drink as usual.  Avoid tobacco use.  Limit alcohol intake to no more than 1 drink per day for nonpregnant women and 2 drinks per day for men. You should drink alcohol only when you are also eating food. Talk with your health care provider whether alcohol is safe for you. Tell your health care provider if you drink alcohol several times a week.  Follow up with your health care provider regularly.  Schedule an eye exam soon after the diagnosis of type 2 diabetes and then annually.  Perform daily skin and foot care. Examine your skin and feet daily for cuts, bruises, redness, nail problems, bleeding, blisters, or sores. A foot exam by a health care provider should be done annually.  Brush your teeth and gums at least twice a day and floss at least once a day. Follow up with your dentist regularly.  Share your diabetes management plan with your workplace or school.  Stay up-to-date with   immunizations.  Learn to manage stress.  Obtain ongoing diabetes education and support as needed.  Participate in, or seek rehabilitation as needed to maintain or improve independence and quality of life. Request a physical or occupational therapy referral if you are having foot or hand numbness or difficulties with grooming, dressing, eating, or physical activity. SEEK MEDICAL CARE IF:   You are unable to eat food or drink fluids for more than 6 hours.  You have nausea and vomiting for more than 6 hours.  Your blood glucose level is over 240 mg/dL.  There is a change in mental status.  You develop an additional serious illness.  You have diarrhea for more than 6 hours.  You have been sick or have had a fever for a couple of days  and are not getting better.  You have pain during any physical activity.  SEEK IMMEDIATE MEDICAL CARE IF:  You have difficulty breathing.  You have moderate to large ketone levels. MAKE SURE YOU:  Understand these instructions.  Will watch your condition.  Will get help right away if you are not doing well or get worse. Document Released: 05/10/2005 Document Revised: 05/15/2013 Document Reviewed: 12/07/2011 [employer] Patient Information 2015 [employer]. This information is not intended to replace advice given to you by your health care provider. Make sure you discuss any questions you have with your health care provider.  

## 2013-11-22 NOTE — Assessment & Plan Note (Signed)
He was praised today for his 15# weight loss

## 2013-11-22 NOTE — Progress Notes (Signed)
Subjective:    Patient ID: Jay Chen, male    DOB: [birth year], 48 y.o.   MRN: [id number]  Hypertension This is a chronic problem. The current episode started more than 1 year ago. The problem is unchanged. The problem is controlled. Pertinent negatives include no anxiety, blurred vision, chest pain, headaches, malaise/fatigue, neck pain, orthopnea, palpitations, peripheral edema, PND, shortness of breath or sweats. Risk factors for coronary artery disease include obesity. Past treatments include diuretics, calcium channel blockers and angiotensin blockers. The current treatment provides moderate improvement. Compliance problems include diet and exercise.  Identifiable causes of hypertension include sleep apnea.      Review of Systems  Constitutional: Negative.  Negative for fever, chills, malaise/fatigue, diaphoresis, appetite change and fatigue.  HENT: Negative.   Eyes: Negative.  Negative for blurred vision.  Respiratory: Positive for apnea. Negative for cough, choking, chest tightness, shortness of breath, wheezing and stridor.   Cardiovascular: Negative.  Negative for chest pain, palpitations, orthopnea, leg swelling and PND.  Gastrointestinal: Negative.  Negative for nausea, abdominal pain, diarrhea, constipation and blood in stool.  Endocrine: Negative.  Negative for polydipsia, polyphagia and polyuria.  Genitourinary: Negative.   Musculoskeletal: Negative.  Negative for arthralgias, back pain, joint swelling, myalgias and neck pain.  Skin: Negative.  Negative for rash.  Allergic/Immunologic: Negative.   Neurological: Negative.  Negative for dizziness, tremors, syncope, weakness, light-headedness, numbness and headaches.  Hematological: Negative.  Negative for adenopathy. Does not bruise/bleed easily.  Psychiatric/Behavioral: Negative.        Objective:   Physical Exam  Vitals reviewed. Constitutional: He is oriented to person, place, and time. He appears well-developed  and well-nourished. No distress.  HENT:  Head: Atraumatic.  Mouth/Throat: Oropharynx is clear and moist. No oropharyngeal exudate.  Eyes: Conjunctivae are normal. Right eye exhibits no discharge. Left eye exhibits no discharge. No scleral icterus.  Neck: Normal range of motion. Neck supple. No JVD present. No tracheal deviation present. No thyromegaly present.  Cardiovascular: Normal rate, regular rhythm, normal heart sounds and intact distal pulses.  Exam reveals no gallop and no friction rub.   No murmur heard. Pulmonary/Chest: Effort normal and breath sounds normal. No stridor. No respiratory distress. He has no wheezes. He has no rales. He exhibits no tenderness.  Abdominal: Soft. Bowel sounds are normal. He exhibits no distension and no mass. There is no tenderness. There is no rebound and no guarding.  Musculoskeletal: Normal range of motion. He exhibits no edema and no tenderness.  Lymphadenopathy:    He has no cervical adenopathy.  Neurological: He is oriented to person, place, and time.  Skin: Skin is warm and dry. No rash noted. He is not diaphoretic. No erythema. No pallor.  Psychiatric: He has a normal mood and affect. His behavior is normal. Judgment and thought content normal.     Lab Results  Component Value Date   WBC 5.9 07/18/2013   HGB 13.7 07/18/2013   HCT 42.0 07/18/2013   PLT 311.0 07/18/2013   GLUCOSE 106* 07/18/2013   CHOL 133 07/18/2013   TRIG 192.0* 07/18/2013   HDL 33.00* 07/18/2013   LDLCALC 62 07/18/2013   ALT 19 07/18/2013   AST 15 07/18/2013   NA 140 07/18/2013   K 3.2* 07/18/2013   CL 97 07/18/2013   CREATININE 1.2 07/18/2013   BUN 15 07/18/2013   CO2 36* 07/18/2013   TSH 2.54 07/18/2013   PSA 0.37 01/19/2012   INR 1.01 09/19/2009   HGBA1C 7.8* 07/18/2013  MICROALBUR 0.9 01/19/2012       Assessment & Plan:

## 2013-11-22 NOTE — Assessment & Plan Note (Signed)
His BP is well controlled His lytes and renal function are stable 

## 2013-11-22 NOTE — Assessment & Plan Note (Signed)
Improvement noted 

## 2013-11-22 NOTE — Assessment & Plan Note (Signed)
His blood sugars are unchanged He will cont to work on his lifestyle modifications

## 2013-11-22 NOTE — Progress Notes (Signed)
Pre visit review using our clinic review tool, if applicable. No additional management support is needed unless otherwise documented below in the visit note. 

## 2014-01-28 ENCOUNTER — Other Ambulatory Visit: Payer: Self-pay | Admitting: Internal Medicine

## 2014-02-01 ENCOUNTER — Telehealth: Payer: Self-pay

## 2014-02-01 NOTE — Telephone Encounter (Signed)
LVM for pt to call back.   RE: Scheduling nurse visit for bp recheck.

## 2014-03-05 ENCOUNTER — Ambulatory Visit (INDEPENDENT_AMBULATORY_CARE_PROVIDER_SITE_OTHER): Payer: BC Managed Care – PPO | Admitting: Internal Medicine

## 2014-03-05 ENCOUNTER — Other Ambulatory Visit (INDEPENDENT_AMBULATORY_CARE_PROVIDER_SITE_OTHER): Payer: BC Managed Care – PPO

## 2014-03-05 ENCOUNTER — Encounter: Payer: Self-pay | Admitting: Internal Medicine

## 2014-03-05 ENCOUNTER — Ambulatory Visit (HOSPITAL_COMMUNITY)
Admission: RE | Admit: 2014-03-05 | Discharge: 2014-03-05 | Disposition: A | Discharge status: Home / Self Care | Payer: BC Managed Care – PPO | Source: Ambulatory Visit | Attending: Internal Medicine | Admitting: Internal Medicine

## 2014-03-05 VITALS — BP 130/86 | HR 75 | Temp 98.2°F | Resp 16 | Ht 68.0 in | Wt 386.0 lb

## 2014-03-05 DIAGNOSIS — E118 Type 2 diabetes mellitus with unspecified complications: Secondary | ICD-10-CM

## 2014-03-05 DIAGNOSIS — J310 Chronic rhinitis: Secondary | ICD-10-CM

## 2014-03-05 DIAGNOSIS — M545 Low back pain, unspecified: Secondary | ICD-10-CM

## 2014-03-05 DIAGNOSIS — I10 Essential (primary) hypertension: Secondary | ICD-10-CM

## 2014-03-05 DIAGNOSIS — E785 Hyperlipidemia, unspecified: Secondary | ICD-10-CM

## 2014-03-05 DIAGNOSIS — E876 Hypokalemia: Secondary | ICD-10-CM

## 2014-03-05 DIAGNOSIS — Z23 Encounter for immunization: Secondary | ICD-10-CM

## 2014-03-05 LAB — HEMOGLOBIN A1C: Hgb A1c MFr Bld: 8 % — ABNORMAL HIGH (ref 4.6–6.5)

## 2014-03-05 LAB — CBC WITH DIFFERENTIAL/PLATELET
Basophils Absolute: 0 10*3/uL (ref 0.0–0.1)
Basophils Relative: 0.4 % (ref 0.0–3.0)
EOS ABS: 0.2 10*3/uL (ref 0.0–0.7)
Eosinophils Relative: 2.3 % (ref 0.0–5.0)
HEMATOCRIT: 43.6 % (ref 39.0–52.0)
HEMOGLOBIN: 14.2 g/dL (ref 13.0–17.0)
LYMPHS ABS: 2.1 10*3/uL (ref 0.7–4.0)
LYMPHS PCT: 25.3 % (ref 12.0–46.0)
MCHC: 32.6 g/dL (ref 30.0–36.0)
MCV: 94.4 fl (ref 78.0–100.0)
Monocytes Absolute: 0.6 10*3/uL (ref 0.1–1.0)
Monocytes Relative: 7.8 % (ref 3.0–12.0)
NEUTROS ABS: 5.3 10*3/uL (ref 1.4–7.7)
Neutrophils Relative %: 64.2 % (ref 43.0–77.0)
Platelets: 297 10*3/uL (ref 150.0–400.0)
RBC: 4.62 Mil/uL (ref 4.22–5.81)
RDW: 14.1 % (ref 11.5–15.5)
WBC: 8.3 10*3/uL (ref 4.0–10.5)

## 2014-03-05 LAB — BASIC METABOLIC PANEL
BUN: 10 mg/dL (ref 6–23)
CO2: 34 meq/L — AB (ref 19–32)
Calcium: 9.1 mg/dL (ref 8.4–10.5)
Chloride: 98 mEq/L (ref 96–112)
Creatinine, Ser: 1.2 mg/dL (ref 0.4–1.5)
GFR: 79.32 mL/min (ref >60.00)
Glucose, Bld: 142 mg/dL — ABNORMAL HIGH (ref 70–99)
POTASSIUM: 3.8 meq/L (ref 3.5–5.1)
SODIUM: 139 meq/L (ref 135–145)

## 2014-03-05 LAB — URINALYSIS, ROUTINE W REFLEX MICROSCOPIC
HGB URINE DIPSTICK: NEGATIVE
Leukocytes, UA: NEGATIVE
Nitrite: NEGATIVE
Specific Gravity, Urine: 1.025 (ref 1.000–1.030)
TOTAL PROTEIN, URINE-UPE24: 30 — AB
URINE GLUCOSE: NEGATIVE
Urobilinogen, UA: 4 — AB (ref 0.0–1.0)
pH: 5.5 (ref 5.0–8.0)

## 2014-03-05 NOTE — Assessment & Plan Note (Signed)
His BP is well controlled His lytes and renal function are stable 

## 2014-03-05 NOTE — Patient Instructions (Signed)
Back Pain, Adult Low back pain is very common. About 1 in 5 people have back pain.The cause of low back pain is rarely dangerous. The pain often gets better over time.About half of people with a sudden onset of back pain feel better in just 2 weeks. About 8 in 10 people feel better by 6 weeks.  CAUSES Some common causes of back pain include:  Strain of the muscles or ligaments supporting the spine.  Wear and tear (degeneration) of the spinal discs.  Arthritis.  Direct injury to the back. DIAGNOSIS Most of the time, the direct cause of low back pain is not known.However, back pain can be treated effectively even when the exact cause of the pain is unknown.Answering your caregiver's questions about your overall health and symptoms is one of the most accurate ways to make sure the cause of your pain is not dangerous. If your caregiver needs more information, he or she may order lab work or imaging tests (X-rays or MRIs).However, even if imaging tests show changes in your back, this usually does not require surgery. HOME CARE INSTRUCTIONS For many people, back pain returns.Since low back pain is rarely dangerous, it is often a condition that people can learn to manageon their own.   Remain active. It is stressful on the back to sit or stand in one place. Do not sit, drive, or stand in one place for more than 30 minutes at a time. Take short walks on level surfaces as soon as pain allows.Try to increase the length of time you walk each day.  Do not stay in bed.Resting more than 1 or 2 days can delay your recovery.  Do not avoid exercise or work.Your body is made to move.It is not dangerous to be active, even though your back may hurt.Your back will likely heal faster if you return to being active before your pain is gone.  Pay attention to your body when you bend and lift. Many people have less discomfortwhen lifting if they bend their knees, keep the load close to their bodies,and  avoid twisting. Often, the most comfortable positions are those that put less stress on your recovering back.  Find a comfortable position to sleep. Use a firm mattress and lie on your side with your knees slightly bent. If you lie on your back, put a pillow under your knees.  Only take over-the-counter or prescription medicines as directed by your caregiver. Over-the-counter medicines to reduce pain and inflammation are often the most helpful.Your caregiver may prescribe muscle relaxant drugs.These medicines help dull your pain so you can more quickly return to your normal activities and healthy exercise.  Put ice on the injured area.  Put ice in a plastic bag.  Place a towel between your skin and the bag.  Leave the ice on for 15-20 minutes, 03-04 times a day for the first 2 to 3 days. After that, ice and heat may be alternated to reduce pain and spasms.  Ask your caregiver about trying back exercises and gentle massage. This may be of some benefit.  Avoid feeling anxious or stressed.Stress increases muscle tension and can worsen back pain.It is important to recognize when you are anxious or stressed and learn ways to manage it.Exercise is a great option. SEEK MEDICAL CARE IF:  You have pain that is not relieved with rest or medicine.  You have pain that does not improve in 1 week.  You have new symptoms.  You are generally not feeling well. SEEK   IMMEDIATE MEDICAL CARE IF:   You have pain that radiates from your back into your legs.  You develop new bowel or bladder control problems.  You have unusual weakness or numbness in your arms or legs.  You develop nausea or vomiting.  You develop abdominal pain.  You feel faint. Document Released: 05/10/2005 Document Revised: 11/09/2011 Document Reviewed: 09/11/2013 Exit[employer] Patient Information 2015 [employer]. This information is not intended to replace advice given to you by your health care provider. Make sure you  discuss any questions you have with your health care provider.  

## 2014-03-05 NOTE — Assessment & Plan Note (Signed)
His A1C is 8, a little too high He will cont his current meds and will work on his lifestyle modifications

## 2014-03-05 NOTE — Assessment & Plan Note (Signed)
Plain film is + for DDD He does not want anything for pain Will refer to pain management to consider treatment options

## 2014-03-05 NOTE — Progress Notes (Signed)
Pre visit review using our clinic review tool, if applicable. No additional management support is needed unless otherwise documented below in the visit note. 

## 2014-03-05 NOTE — Progress Notes (Signed)
Subjective:    Patient ID: Jay Chen, male    DOB: 1966/03/23, 48 y.o.   MRN: [id number]  Back Pain This is a recurrent problem. Episode onset: 3 months ago. The problem occurs intermittently. The problem has been gradually worsening since onset. The pain is present in the lumbar spine. The quality of the pain is described as shooting and stabbing. The pain does not radiate. The pain is at a severity of 2/10. The pain is mild. The pain is worse during the day. The symptoms are aggravated by position and standing. Stiffness is present in the morning. Pertinent negatives include no abdominal pain, bladder incontinence, bowel incontinence, chest pain, dysuria, fever, headaches, leg pain, numbness, paresis, paresthesias, pelvic pain, perianal numbness, tingling, weakness or weight loss. Risk factors include obesity and lack of exercise. He has tried nothing for the symptoms. The treatment provided no relief.      Review of Systems  Constitutional: Negative.  Negative for fever, chills, weight loss, diaphoresis, appetite change and fatigue.  HENT: Negative.   Eyes: Negative.   Respiratory: Negative.  Negative for cough, choking, chest tightness, shortness of breath, wheezing and stridor.   Cardiovascular: Negative.  Negative for chest pain, palpitations and leg swelling.  Gastrointestinal: Negative.  Negative for nausea, vomiting, abdominal pain, diarrhea, constipation, blood in stool and bowel incontinence.  Endocrine: Negative.  Negative for polydipsia, polyphagia and polyuria.  Genitourinary: Negative.  Negative for bladder incontinence, dysuria, urgency, frequency, hematuria, flank pain, decreased urine volume, discharge, penile swelling, scrotal swelling, enuresis, difficulty urinating, genital sores, penile pain, testicular pain and pelvic pain.  Musculoskeletal: Positive for back pain. Negative for arthralgias, gait problem, joint swelling, myalgias, neck pain and neck stiffness.  Skin:  Negative.  Negative for rash.  Allergic/Immunologic: Negative.   Neurological: Negative.  Negative for dizziness, tingling, weakness, numbness, headaches and paresthesias.  Hematological: Negative.  Negative for adenopathy. Does not bruise/bleed easily.  Psychiatric/Behavioral: Negative.        Objective:   Physical Exam  Vitals reviewed. Constitutional: He is oriented to person, place, and time. He appears well-developed and well-nourished. No distress.  HENT:  Head: Normocephalic and atraumatic.  Mouth/Throat: Oropharynx is clear and moist. No oropharyngeal exudate.  Eyes: Conjunctivae are normal. Right eye exhibits no discharge. Left eye exhibits no discharge. No scleral icterus.  Neck: Normal range of motion. Neck supple. No JVD present. No tracheal deviation present. No thyromegaly present.  Cardiovascular: Normal rate, regular rhythm, normal heart sounds and intact distal pulses.  Exam reveals no gallop and no friction rub.   No murmur heard. Pulmonary/Chest: Effort normal and breath sounds normal. No stridor. No respiratory distress. He has no wheezes. He has no rales. He exhibits no tenderness.  Abdominal: Soft. Bowel sounds are normal. He exhibits no distension and no mass. There is no tenderness. There is no rebound and no guarding.  Musculoskeletal: Normal range of motion. He exhibits no edema and no tenderness.       Lumbar back: Normal. He exhibits normal range of motion, no tenderness, no bony tenderness, no swelling, no edema, no deformity, no laceration, no pain, no spasm and normal pulse.  Lymphadenopathy:    He has no cervical adenopathy.  Neurological: He is alert and oriented to person, place, and time. He has normal strength. He displays no atrophy, no tremor and normal reflexes. No cranial nerve deficit or sensory deficit. He exhibits normal muscle tone. He displays a negative Romberg sign. He displays no seizure activity. Coordination  and gait normal.  Reflex  Scores:      Tricep reflexes are 1+ on the right side and 1+ on the left side.      Bicep reflexes are 1+ on the right side and 1+ on the left side.      Brachioradialis reflexes are 1+ on the right side and 1+ on the left side.      Patellar reflexes are 1+ on the right side and 1+ on the left side.      Achilles reflexes are 2+ on the right side and 2+ on the left side. Neg SLR in BLE  Skin: Skin is warm and dry. No rash noted. He is not diaphoretic. No erythema. No pallor.     Lab Results  Component Value Date   WBC 5.9 07/18/2013   HGB 13.7 07/18/2013   HCT 42.0 07/18/2013   PLT 311.0 07/18/2013   GLUCOSE 157* 11/22/2013   CHOL 133 07/18/2013   TRIG 192.0* 07/18/2013   HDL 33.00* 07/18/2013   LDLCALC 62 07/18/2013   ALT 19 07/18/2013   AST 15 07/18/2013   NA 141 11/22/2013   K 3.6 11/22/2013   CL 98 11/22/2013   CREATININE 1.4 11/22/2013   BUN 14 11/22/2013   CO2 39* 11/22/2013   TSH 2.54 07/18/2013   PSA 0.37 01/19/2012   INR 1.01 09/19/2009   HGBA1C 7.9* 11/22/2013   MICROALBUR 0.9 01/19/2012       Assessment & Plan:

## 2014-03-26 ENCOUNTER — Encounter: Payer: Self-pay | Admitting: Internal Medicine

## 2014-03-26 ENCOUNTER — Ambulatory Visit (INDEPENDENT_AMBULATORY_CARE_PROVIDER_SITE_OTHER): Payer: BC Managed Care – PPO | Admitting: Internal Medicine

## 2014-03-26 VITALS — BP 138/86 | HR 80 | Temp 98.1°F | Resp 16 | Ht 68.0 in | Wt 387.1 lb

## 2014-03-26 DIAGNOSIS — I10 Essential (primary) hypertension: Secondary | ICD-10-CM

## 2014-03-26 DIAGNOSIS — E118 Type 2 diabetes mellitus with unspecified complications: Secondary | ICD-10-CM

## 2014-03-26 DIAGNOSIS — M545 Low back pain, unspecified: Secondary | ICD-10-CM

## 2014-03-26 NOTE — Assessment & Plan Note (Signed)
His blood sugars are not very well controlled He will cont the current regimen, he is not willing to start insulin He will work on his lifestyle modifications

## 2014-03-26 NOTE — Patient Instructions (Signed)

## 2014-03-26 NOTE — Progress Notes (Signed)
Pre visit review using our clinic review tool, if applicable. No additional management support is needed unless otherwise documented below in the visit note. 

## 2014-03-26 NOTE — Assessment & Plan Note (Signed)
His BP is well controlled 

## 2014-03-26 NOTE — Assessment & Plan Note (Signed)
He will see pain management about this next week

## 2014-03-26 NOTE — Progress Notes (Signed)
Subjective:    Patient ID: Jay Chen, male    DOB: Dec 14, 1965, 48 y.o.   MRN: [id number]  Hypertension This is a chronic problem. The current episode started more than 1 year ago. The problem has been gradually improving since onset. The problem is controlled. Pertinent negatives include no anxiety, blurred vision, chest pain, headaches, malaise/fatigue, neck pain, orthopnea, palpitations, peripheral edema, PND, shortness of breath or sweats. Risk factors for coronary artery disease include obesity and male gender. Past treatments include angiotensin blockers, calcium channel blockers, diuretics and central alpha agonists. Compliance problems include diet and exercise.  Identifiable causes of hypertension include sleep apnea.      Review of Systems  Constitutional: Negative.  Negative for fever, chills, malaise/fatigue, diaphoresis, appetite change and fatigue.  HENT: Negative.   Eyes: Negative.  Negative for blurred vision.  Respiratory: Negative.  Negative for cough, choking, chest tightness, shortness of breath and stridor.   Cardiovascular: Negative.  Negative for chest pain, palpitations, orthopnea, leg swelling and PND.  Gastrointestinal: Negative.  Negative for nausea, vomiting, abdominal pain, diarrhea, constipation and blood in stool.  Endocrine: Negative.   Genitourinary: Negative.   Musculoskeletal: Positive for back pain. Negative for myalgias, joint swelling, arthralgias, gait problem, neck pain and neck stiffness.  Skin: Negative.  Negative for rash.  Allergic/Immunologic: Negative.   Neurological: Negative.  Negative for headaches.  Hematological: Negative.  Negative for adenopathy. Does not bruise/bleed easily.  Psychiatric/Behavioral: Negative.        Objective:   Physical Exam  Constitutional: He is oriented to person, place, and time. He appears well-developed and well-nourished. No distress.  HENT:  Head: Normocephalic and atraumatic.  Mouth/Throat:  Oropharynx is clear and moist. No oropharyngeal exudate.  Eyes: Conjunctivae are normal. Right eye exhibits no discharge. Left eye exhibits no discharge. No scleral icterus.  Neck: Normal range of motion. Neck supple. No JVD present. No tracheal deviation present. No thyromegaly present.  Cardiovascular: Normal rate, regular rhythm, normal heart sounds and intact distal pulses.  Exam reveals no gallop and no friction rub.   No murmur heard. Pulmonary/Chest: Effort normal and breath sounds normal. No stridor. No respiratory distress. He has no wheezes. He has no rales. He exhibits no tenderness.  Abdominal: Soft. Bowel sounds are normal. He exhibits no distension and no mass. There is no tenderness. There is no rebound and no guarding.  Musculoskeletal: He exhibits no edema or tenderness.  Lymphadenopathy:    He has no cervical adenopathy.  Neurological: He is oriented to person, place, and time.  Skin: Skin is warm and dry. No rash noted. He is not diaphoretic. No erythema. No pallor.  Psychiatric: He has a normal mood and affect. His behavior is normal. Judgment and thought content normal.  Vitals reviewed.   Lab Results  Component Value Date   WBC 8.3 03/05/2014   HGB 14.2 03/05/2014   HCT 43.6 03/05/2014   PLT 297.0 03/05/2014   GLUCOSE 142* 03/05/2014   CHOL 133 07/18/2013   TRIG 192.0* 07/18/2013   HDL 33.00* 07/18/2013   LDLCALC 62 07/18/2013   ALT 19 07/18/2013   AST 15 07/18/2013   NA 139 03/05/2014   K 3.8 03/05/2014   CL 98 03/05/2014   CREATININE 1.2 03/05/2014   BUN 10 03/05/2014   CO2 34* 03/05/2014   TSH 2.54 07/18/2013   PSA 0.37 01/19/2012   INR 1.01 09/19/2009   HGBA1C 8.0* 03/05/2014   MICROALBUR 0.9 01/19/2012  Assessment & Plan:

## 2014-03-30 ENCOUNTER — Other Ambulatory Visit: Payer: Self-pay | Admitting: Internal Medicine

## 2014-04-08 ENCOUNTER — Other Ambulatory Visit: Payer: Self-pay | Admitting: Pain Medicine

## 2014-04-08 DIAGNOSIS — M545 Low back pain: Secondary | ICD-10-CM

## 2014-04-16 ENCOUNTER — Ambulatory Visit
Admission: RE | Admit: 2014-04-16 | Discharge: 2014-04-16 | Disposition: A | Discharge status: Home / Self Care | Payer: BC Managed Care – PPO | Source: Ambulatory Visit | Attending: Pain Medicine | Admitting: Pain Medicine

## 2014-04-16 DIAGNOSIS — M545 Low back pain: Secondary | ICD-10-CM

## 2014-05-12 ENCOUNTER — Other Ambulatory Visit: Payer: Self-pay | Admitting: Internal Medicine

## 2014-06-18 ENCOUNTER — Other Ambulatory Visit: Payer: Self-pay | Admitting: Internal Medicine

## 2014-07-06 ENCOUNTER — Other Ambulatory Visit: Payer: Self-pay | Admitting: Internal Medicine

## 2014-11-18 ENCOUNTER — Other Ambulatory Visit: Payer: Self-pay

## 2015-02-08 ENCOUNTER — Emergency Department (HOSPITAL_COMMUNITY): Payer: BC Managed Care – PPO

## 2015-02-08 ENCOUNTER — Emergency Department (HOSPITAL_COMMUNITY)
Admission: EM | Admit: 2015-02-08 | Discharge: 2015-02-09 | Disposition: A | Discharge status: Home / Self Care | Payer: BC Managed Care – PPO | Attending: Emergency Medicine | Admitting: Emergency Medicine

## 2015-02-08 ENCOUNTER — Encounter (HOSPITAL_COMMUNITY): Payer: Self-pay | Admitting: Emergency Medicine

## 2015-02-08 DIAGNOSIS — I10 Essential (primary) hypertension: Secondary | ICD-10-CM | POA: Insufficient documentation

## 2015-02-08 DIAGNOSIS — E119 Type 2 diabetes mellitus without complications: Secondary | ICD-10-CM | POA: Insufficient documentation

## 2015-02-08 DIAGNOSIS — R55 Syncope and collapse: Secondary | ICD-10-CM | POA: Insufficient documentation

## 2015-02-08 DIAGNOSIS — E876 Hypokalemia: Secondary | ICD-10-CM | POA: Insufficient documentation

## 2015-02-08 DIAGNOSIS — E785 Hyperlipidemia, unspecified: Secondary | ICD-10-CM | POA: Insufficient documentation

## 2015-02-08 DIAGNOSIS — Z8669 Personal history of other diseases of the nervous system and sense organs: Secondary | ICD-10-CM | POA: Insufficient documentation

## 2015-02-08 DIAGNOSIS — I509 Heart failure, unspecified: Secondary | ICD-10-CM | POA: Insufficient documentation

## 2015-02-08 DIAGNOSIS — R42 Dizziness and giddiness: Secondary | ICD-10-CM | POA: Insufficient documentation

## 2015-02-08 DIAGNOSIS — Z79899 Other long term (current) drug therapy: Secondary | ICD-10-CM | POA: Insufficient documentation

## 2015-02-08 LAB — BASIC METABOLIC PANEL
Anion gap: 10 (ref 5–15)
BUN: 21 mg/dL — AB (ref 6–20)
CALCIUM: 8.8 mg/dL — AB (ref 8.9–10.3)
CHLORIDE: 101 mmol/L (ref 101–111)
CO2: 31 mmol/L (ref 22–32)
CREATININE: 1.94 mg/dL — AB (ref 0.61–1.24)
GFR calc Af Amer: 44 mL/min — ABNORMAL LOW (ref >60)
GFR, EST NON AFRICAN AMERICAN: 38 mL/min — AB (ref >60)
Glucose, Bld: 158 mg/dL — ABNORMAL HIGH (ref 65–99)
Potassium: 2.8 mmol/L — ABNORMAL LOW (ref 3.5–5.1)
SODIUM: 142 mmol/L (ref 135–145)

## 2015-02-08 LAB — URINE MICROSCOPIC-ADD ON

## 2015-02-08 LAB — URINALYSIS, ROUTINE W REFLEX MICROSCOPIC
Glucose, UA: NEGATIVE mg/dL
Hgb urine dipstick: NEGATIVE
KETONES UR: 15 mg/dL — AB
NITRITE: NEGATIVE
PH: 5.5 (ref 5.0–8.0)
Protein, ur: 100 mg/dL — AB
Specific Gravity, Urine: 1.026 (ref 1.005–1.030)
Urobilinogen, UA: 1 mg/dL (ref 0.0–1.0)

## 2015-02-08 LAB — CBC
HCT: 41.7 % (ref 39.0–52.0)
Hemoglobin: 14.4 g/dL (ref 13.0–17.0)
MCH: 32.6 pg (ref 26.0–34.0)
MCHC: 34.5 g/dL (ref 30.0–36.0)
MCV: 94.3 fL (ref 78.0–100.0)
PLATELETS: 284 10*3/uL (ref 150–400)
RBC: 4.42 MIL/uL (ref 4.22–5.81)
RDW: 13.8 % (ref 11.5–15.5)
WBC: 9.2 10*3/uL (ref 4.0–10.5)

## 2015-02-08 LAB — CBG MONITORING, ED: Glucose-Capillary: 153 mg/dL — ABNORMAL HIGH (ref 65–99)

## 2015-02-08 MED ORDER — CLONIDINE HCL 0.1 MG PO TABS
0.2000 mg | ORAL_TABLET | Freq: Once | ORAL | Status: AC
  Administered 2015-02-08: 0.2 mg via ORAL
  Filled 2015-02-08: qty 2

## 2015-02-08 NOTE — ED Provider Notes (Signed)
CSN: [id number]     Arrival date & time 02/08/15  2153 History  This chart was scribed for Veryl Speak, MD by Randa Evens, ED Scribe. This patient was seen in room WA17/WA17 and the patient's care was started at 11:02 PM.     Chief Complaint  Patient presents with  . Near Syncope   Patient is a 49 y.o. male presenting with syncope. The history is provided by the patient. No language interpreter was used.  Loss of Consciousness Episode history:  Single Most recent episode:  Today Chronicity:  New Witnessed: yes   Relieved by:  None tried Worsened by:  Nothing tried Associated symptoms: dizziness   Associated symptoms: no chest pain and no palpitations    HPI Comments: Jay Chen is a 49 y.o. male who presents to the Emergency Department complaining of syncopal episode onset tonight PTA. Pt states that he got dizzy and light headed initially and soon after he fell backwards hitting the back of his head on the pavement. Pt states that he was feeling normal for the most part of the day until the syncopal episode. Pt does report having intermittent diarrhea today.  Pt denies palpitations or other related symptoms. Pt states that he has been complaint with all his medications.   Past Medical History  Diagnosis Date  . Congestive heart failure May of 2011    Felt to have cor pulmonale; EF 45 to 50% from echo in May 2011  . Hypertension   . Thoracic aneurysm     Followed by [name]  . Morbid obesity   . Diabetes mellitus   . Hyperlipidemia   . Sleep apnea    Past Surgical History  Procedure Laterality Date  . US echocardiography  09/21/2009    EF 45-50%; Cavity size was severely dilated, severe concentric hypertrophy and normal wall motion  . Cholecystectomy  2000   Family History  Problem Relation Age of Onset  . Hypertension Mother   . Pancreatic cancer Father   . Prostate cancer Father   . Colon cancer Father    Social History  Substance Use Topics  .  Smoking status: Never Smoker   . Smokeless tobacco: Never Used  . Alcohol Use: 3.0 oz/week    5 Cans of beer per week     Comment: Rarely    Review of Systems  Cardiovascular: Positive for syncope and near-syncope. Negative for chest pain and palpitations.  Neurological: Positive for dizziness, syncope and light-headedness.  All other systems reviewed and are negative.    Allergies  Review of patient's allergies indicates no known allergies.  Home Medications   Prior to Admission medications   Medication Sig Start Date End Date Taking? Authorizing Provider  albuterol (PROVENTIL HFA;VENTOLIN HFA) 108 (90 BASE) MCG/ACT inhaler Inhale 2 puffs into the lungs every 6 (six) hours as needed for wheezing or shortness of breath.   Yes Historical Provider, MD  atorvastatin (LIPITOR) 40 MG tablet TAKE 1 TABLET (40 MG TOTAL) BY MOUTH DAILY. 06/18/14  Yes [name], MD  Canagliflozin (INVOKANA) 300 MG TABS Take 1 tablet (300 mg total) by mouth daily. 07/18/13  Yes [name], MD  cloNIDine (CATAPRES) 0.1 MG tablet TAKE 1 TABLET BY MOUTH EVERY MORNING AND TAKE 2 TABLETS EVERY NIGHT   Yes [name], MD  fenofibrate 54 MG tablet Take 1 tablet (54 mg total) by mouth daily. 07/18/13  Yes [name], MD  furosemide (LASIX) 40 MG tablet TAKE  1 TABLET DAILY 07/18/13  Yes [name], MD  KLOR-CON M20 20 MEQ tablet TAKE 2 TABLETS EVERY DAY 03/31/14  Yes [name], MD  [id number] (64 MG) MG TBCR ONE BY MOUTH EVERY DAY 07/07/14  Yes [name], MD  omega-3 acid ethyl esters (LOVAZA) 1 G capsule Take 2 capsules (2 g total) by mouth 2 (two) times daily. 07/18/13  Yes [name], MD  sitaGLIPtin-metformin (JANUMET) 50-500 MG per tablet Take 1 tablet by mouth 2 (two) times daily with a meal. 07/18/13  Yes [name], MD  TRIBENZOR 40-10-25 MG TABS TAKE 1 TABLET BY MOUTH DAILY. 05/12/14  Yes [name], MD  ALBUTEROL IN Inhale into the lungs as needed. 2 puffs     Historical  Provider, MD  colchicine 0.6 MG tablet Take 1 tablet (0.6 mg total) by mouth 2 (two) times daily. Patient taking differently: Take 0.6 mg by mouth daily as needed.  07/18/13   [name], MD  glucose blood (BAYER CONTOUR NEXT TEST) test strip Use BID Patient not taking: Reported on 02/08/2015 09/28/12   [name], MD  Lancets Misc. Patricia Pesa 1) MISC Test up to twice daily Patient not taking: Reported on 02/08/2015 09/28/12   [name], MD  potassium chloride (K-DUR) 10 MEQ tablet Take 2 tablets (20 mEq total) by mouth 2 (two) times daily. 02/09/15   Veryl Speak, MD   BP 164/92 mmHg  Pulse 80  Temp(Src) 98.2 F (36.8 C) (Oral)  Resp 19  Ht 5\' 8"  (1.727 m)  Wt 270 lb (122.471 kg)  BMI 41.06 kg/m2  SpO2 94%   Physical Exam  Constitutional: He is oriented to person, place, and time. He appears well-developed and well-nourished. No distress.  HENT:  Head: Normocephalic and atraumatic.  Eyes: Conjunctivae and EOM are normal. Pupils are equal, round, and reactive to light.  Neck: Neck supple. No tracheal deviation present.  Cardiovascular: Normal rate, regular rhythm and normal heart sounds.   No murmur heard. Pulmonary/Chest: Effort normal and breath sounds normal. No respiratory distress. He has no wheezes. He has no rales.  Abdominal: Soft. He exhibits no distension. There is no tenderness.  Musculoskeletal: Normal range of motion. He exhibits no edema.  Neurological: He is alert and oriented to person, place, and time. No cranial nerve deficit. He exhibits normal muscle tone. Coordination normal.  Skin: Skin is warm and dry.  Psychiatric: He has a normal mood and affect. His behavior is normal.  Nursing note and vitals reviewed.   ED Course  Procedures (including critical care time) DIAGNOSTIC STUDIES: Oxygen Saturation is 96% on RA, adequate by my interpretation.    COORDINATION OF CARE: 11:07 PM-Discussed treatment plan with pt at bedside and pt agreed to plan.      Labs Review Labs Reviewed  BASIC METABOLIC PANEL - Abnormal; Notable for the following:    Potassium 2.8 (*)    Glucose, Bld 158 (*)    BUN 21 (*)    Creatinine, Ser 1.94 (*)    Calcium 8.8 (*)    GFR calc non Af Amer 38 (*)    GFR calc Af Amer 44 (*)    All other components within normal limits  URINALYSIS, ROUTINE W REFLEX MICROSCOPIC (NOT AT [facility]) - Abnormal; Notable for the following:    Color, Urine ORANGE (*)    APPearance CLOUDY (*)    Bilirubin Urine SMALL (*)    Ketones, ur 15 (*)  Protein, ur 100 (*)    Leukocytes, UA MODERATE (*)    All other components within normal limits  URINE MICROSCOPIC-ADD ON - Abnormal; Notable for the following:    Squamous Epithelial / LPF MANY (*)    Bacteria, UA MANY (*)    Casts HYALINE CASTS (*)    Crystals CA OXALATE CRYSTALS (*)    All other components within normal limits  CBG MONITORING, ED - Abnormal; Notable for the following:    Glucose-Capillary 153 (*)    All other components within normal limits  CBC    Imaging Review Ct Head Wo Contrast  02/09/2015   CLINICAL DATA:  49 year old male with syncope and fall.  EXAM: CT HEAD WITHOUT CONTRAST  TECHNIQUE: Contiguous axial images were obtained from the base of the skull through the vertex without intravenous contrast.  COMPARISON:  Head CT dated 09/27/2009  FINDINGS: The ventricles and the sulci are appropriate in size for the patient's age. There is no intracranial hemorrhage. No midline shift or mass effect identified. The gray-white matter differentiation is preserved.  The visualized paranasal sinuses and mastoid air cells are well aerated. Right maxillary sinus retention cyst or polyp. The calvarium is intact.  IMPRESSION: No acute intracranial pathology.   Electronically Signed   By: Anner Crete M.D.   On: 02/09/2015 01:00     EKG Interpretation   Date/Time:  Saturday February 08 2015 22:19:14 EDT Ventricular Rate:  89 PR Interval:  199 QRS Duration: 118 QT  Interval:  418 QTC Calculation: 509 R Axis:   162 Text Interpretation:  Sinus rhythm Atrial premature complex IRBBB and LPFB  Confirmed by DELO  MD, DOUGLAS ([id number]) on 02/09/2015 1:10:11 AM      MDM   Final diagnoses:  Syncope, unspecified syncope type  Hypokalemia      Patient is a 49 year old male who presents with complaints of near syncope while arguing with an umpire at a baseball game. He did fall back and hit the back of his head, but did not lose consciousness. His neurologic exam is nonfocal and head CT is negative. Laboratory studies are essentially unremarkable with the exception of a low potassium which will be treated with oral replacement. He was observed for several hours and his blood pressure has greatly improved after he was given his regular dose of clonidine which she missed prior to coming here. He is to follow-up as needed.   I personally performed the services described in this documentation, which was scribed in my presence. The recorded information has been reviewed and is accurate.      Veryl Speak, MD 02/09/15 [phone]

## 2015-02-08 NOTE — ED Notes (Signed)
MD at bedside. 

## 2015-02-08 NOTE — ED Notes (Signed)
Pt arrived to the ED with a complaint of near syncope.  Pt has been outside at a baseball tournament all day.  Pt was in an argument with a coach and umpire which he fell down and hit his head on the pavement.  Pt has a history of hypertension and is hypertensive at this time.

## 2015-02-09 MED ORDER — POTASSIUM CHLORIDE CRYS ER 20 MEQ PO TBCR
40.0000 meq | EXTENDED_RELEASE_TABLET | Freq: Once | ORAL | Status: AC
  Administered 2015-02-09: 40 meq via ORAL
  Filled 2015-02-09: qty 2

## 2015-02-09 MED ORDER — POTASSIUM CHLORIDE ER 10 MEQ PO TBCR: 20.0000 meq | EXTENDED_RELEASE_TABLET | Freq: Two times a day (BID) | ORAL | Status: DC

## 2015-02-09 NOTE — Discharge Instructions (Signed)
Potassium as prescribed.  Increase your fluid intake by 2 glasses of water per day for the next 2 days.  Return to the emergency department if symptoms significantly worsen or change.   Syncope Syncope is a medical term for fainting or passing out. This means you lose consciousness and drop to the ground. People are generally unconscious for less than 5 minutes. You may have some muscle twitches for up to 15 seconds before waking up and returning to normal. Syncope occurs more often in older adults, but it can happen to anyone. While most causes of syncope are not dangerous, syncope can be a sign of a serious medical problem. It is important to seek medical care.  CAUSES  Syncope is caused by a sudden drop in blood flow to the brain. The specific cause is often not determined. Factors that can bring on syncope include:  Taking medicines that lower blood pressure.  Sudden changes in posture, such as standing up quickly.  Taking more medicine than prescribed.  Standing in one place for too long.  Seizure disorders.  Dehydration and excessive exposure to heat.  Low blood sugar (hypoglycemia).  Straining to have a bowel movement.  Heart disease, irregular heartbeat, or other circulatory problems.  Fear, emotional distress, seeing blood, or severe pain. SYMPTOMS  Right before fainting, you may:  Feel dizzy or light-headed.  Feel nauseous.  See all white or all black in your field of vision.  Have cold, clammy skin. DIAGNOSIS  Your health care provider will ask about your symptoms, perform a physical exam, and perform an electrocardiogram (ECG) to record the electrical activity of your heart. Your health care provider may also perform other heart or blood tests to determine the cause of your syncope which may include:  Transthoracic echocardiogram (TTE). During echocardiography, sound waves are used to evaluate how blood flows through your heart.  Transesophageal  echocardiogram (TEE).  Cardiac monitoring. This allows your health care provider to monitor your heart rate and rhythm in real time.  Holter monitor. This is a portable device that records your heartbeat and can help diagnose heart arrhythmias. It allows your health care provider to track your heart activity for several days, if needed.  Stress tests by exercise or by giving medicine that makes the heart beat faster. TREATMENT  In most cases, no treatment is needed. Depending on the cause of your syncope, your health care provider may recommend changing or stopping some of your medicines. HOME CARE INSTRUCTIONS  Have someone stay with you until you feel stable.  Do not drive, use machinery, or play sports until your health care provider says it is okay.  Keep all follow-up appointments as directed by your health care provider.  Lie down right away if you start feeling like you might faint. Breathe deeply and steadily. Wait until all the symptoms have passed.  Drink enough fluids to keep your urine clear or pale yellow.  If you are taking blood pressure or heart medicine, get up slowly and take several minutes to sit and then stand. This can reduce dizziness. SEEK IMMEDIATE MEDICAL CARE IF:   You have a severe headache.  You have unusual pain in the chest, abdomen, or back.  You are bleeding from your mouth or rectum, or you have black or tarry stool.  You have an irregular or very fast heartbeat.  You have pain with breathing.  You have repeated fainting or seizure-like jerking during an episode.  You faint when sitting or lying  down.  You have confusion.  You have trouble walking.  You have severe weakness.  You have vision problems. If you fainted, call your local emergency services (911 in [place] Do not drive yourself to the hospital.  MAKE SURE YOU:  Understand these instructions.  Will watch your condition.  Will get help right away if you are not doing well  or get worse. Document Released: 05/10/2005 Document Revised: 05/15/2013 Document Reviewed: 07/09/2011 [facility] Patient Information 2015 [place]. This information is not intended to replace advice given to you by your health care provider. Make sure you discuss any questions you have with your health care provider.  Hypokalemia Hypokalemia means that the amount of potassium in the blood is lower than normal.Potassium is a chemical, called an electrolyte, that helps regulate the amount of fluid in the body. It also stimulates muscle contraction and helps nerves function properly.Most of the body's potassium is inside of cells, and only a very small amount is in the blood. Because the amount in the blood is so small, minor changes can be life-threatening. CAUSES  Antibiotics.  Diarrhea or vomiting.  Using laxatives too much, which can cause diarrhea.  Chronic kidney disease.  Water pills (diuretics).  Eating disorders (bulimia).  Low magnesium level.  Sweating a lot. SIGNS AND SYMPTOMS  Weakness.  Constipation.  Fatigue.  Muscle cramps.  Mental confusion.  Skipped heartbeats or irregular heartbeat (palpitations).  Tingling or numbness. DIAGNOSIS  Your health care provider can diagnose hypokalemia with blood tests. In addition to checking your potassium level, your health care provider may also check other lab tests. TREATMENT Hypokalemia can be treated with potassium supplements taken by mouth or adjustments in your current medicines. If your potassium level is very low, you may need to get potassium through a vein (IV) and be monitored in the hospital. A diet high in potassium is also helpful. Foods high in potassium are:  Nuts, such as peanuts and pistachios.  Seeds, such as sunflower seeds and pumpkin seeds.  Peas, lentils, and lima beans.  Whole grain and bran cereals and breads.  Fresh fruit and vegetables, such as apricots, avocado, bananas,  cantaloupe, kiwi, oranges, tomatoes, asparagus, and potatoes.  Orange and tomato juices.  Red meats.  Fruit yogurt. HOME CARE INSTRUCTIONS  Take all medicines as prescribed by your health care provider.  Maintain a healthy diet by including nutritious food, such as fruits, vegetables, nuts, whole grains, and lean meats.  If you are taking a laxative, be sure to follow the directions on the label. SEEK MEDICAL CARE IF:  Your weakness gets worse.  You feel your heart pounding or racing.  You are vomiting or having diarrhea.  You are diabetic and having trouble keeping your blood glucose in the normal range. SEEK IMMEDIATE MEDICAL CARE IF:  You have chest pain, shortness of breath, or dizziness.  You are vomiting or having diarrhea for more than 2 days.  You faint. MAKE SURE YOU:   Understand these instructions.  Will watch your condition.  Will get help right away if you are not doing well or get worse. Document Released: 05/10/2005 Document Revised: 02/28/2013 Document Reviewed: 11/10/2012 [facility] Patient Information 2015 [place], [place]. This information is not intended to replace advice given to you by your health care provider. Make sure you discuss any questions you have with your health care provider.

## 2015-02-09 NOTE — ED Notes (Signed)
Pt states he is feeling a lot better, he is advised his potassium is low and he needs to take prescription as prescribed and increase water for dehydration,

## 2015-02-10 ENCOUNTER — Other Ambulatory Visit: Payer: Self-pay

## 2015-02-10 MED ORDER — CLONIDINE HCL 0.1 MG PO TABS: ORAL_TABLET | ORAL | Status: DC

## 2015-02-10 NOTE — Telephone Encounter (Signed)
Ok to rF?

## 2015-04-16 ENCOUNTER — Other Ambulatory Visit: Payer: Self-pay | Admitting: Geriatric Medicine

## 2015-04-16 MED ORDER — POTASSIUM CHLORIDE CRYS ER 20 MEQ PO TBCR: 40.0000 meq | EXTENDED_RELEASE_TABLET | Freq: Every day | ORAL | Status: DC

## 2015-05-05 ENCOUNTER — Encounter: Payer: Self-pay | Admitting: Internal Medicine

## 2015-05-27 ENCOUNTER — Other Ambulatory Visit: Payer: Self-pay | Admitting: Internal Medicine

## 2015-05-28 ENCOUNTER — Telehealth: Payer: Self-pay | Admitting: Internal Medicine

## 2015-05-28 MED ORDER — OLMESARTAN-AMLODIPINE-HCTZ 40-10-25 MG PO TABS: 1.0000 | ORAL_TABLET | Freq: Every day | ORAL | Status: DC

## 2015-05-28 NOTE — Telephone Encounter (Signed)
Patient has schedule CPE for 1/10.  Patient is requesting to have tribenzor sent to [facility] on [address] because he is currently out.

## 2015-05-28 NOTE — Telephone Encounter (Signed)
Must keep Apt for further refills

## 2015-06-03 ENCOUNTER — Other Ambulatory Visit (INDEPENDENT_AMBULATORY_CARE_PROVIDER_SITE_OTHER): Payer: BLUE CROSS/BLUE SHIELD

## 2015-06-03 ENCOUNTER — Encounter: Payer: Self-pay | Admitting: Internal Medicine

## 2015-06-03 ENCOUNTER — Ambulatory Visit (INDEPENDENT_AMBULATORY_CARE_PROVIDER_SITE_OTHER): Payer: BLUE CROSS/BLUE SHIELD | Admitting: Internal Medicine

## 2015-06-03 VITALS — BP 120/80 | HR 69 | Temp 98.1°F | Resp 16 | Ht 68.0 in | Wt 357.0 lb

## 2015-06-03 DIAGNOSIS — Z1211 Encounter for screening for malignant neoplasm of colon: Secondary | ICD-10-CM | POA: Insufficient documentation

## 2015-06-03 DIAGNOSIS — E785 Hyperlipidemia, unspecified: Secondary | ICD-10-CM

## 2015-06-03 DIAGNOSIS — N478 Other disorders of prepuce: Secondary | ICD-10-CM | POA: Diagnosis not present

## 2015-06-03 DIAGNOSIS — IMO0001 Reserved for inherently not codable concepts without codable children: Secondary | ICD-10-CM | POA: Insufficient documentation

## 2015-06-03 DIAGNOSIS — Z23 Encounter for immunization: Secondary | ICD-10-CM | POA: Diagnosis not present

## 2015-06-03 DIAGNOSIS — E118 Type 2 diabetes mellitus with unspecified complications: Secondary | ICD-10-CM

## 2015-06-03 DIAGNOSIS — Z0000 Encounter for general adult medical examination without abnormal findings: Secondary | ICD-10-CM | POA: Diagnosis not present

## 2015-06-03 DIAGNOSIS — G4730 Sleep apnea, unspecified: Secondary | ICD-10-CM

## 2015-06-03 LAB — MICROALBUMIN / CREATININE URINE RATIO
CREATININE, U: 669.4 mg/dL
MICROALB UR: 7.5 mg/dL — AB (ref 0.0–1.9)
Microalb Creat Ratio: 1.1 mg/g (ref 0.0–30.0)

## 2015-06-03 LAB — LIPID PANEL
CHOL/HDL RATIO: 5
Cholesterol: 169 mg/dL (ref 0–200)
HDL: 32.2 mg/dL — ABNORMAL LOW (ref >39.00)
LDL Cholesterol: 98 mg/dL (ref 0–99)
NONHDL: 136.66
TRIGLYCERIDES: 194 mg/dL — AB (ref 0.0–149.0)
VLDL: 38.8 mg/dL (ref 0.0–40.0)

## 2015-06-03 LAB — COMPREHENSIVE METABOLIC PANEL
ALK PHOS: 68 U/L (ref 39–117)
ALT: 13 U/L (ref 0–53)
AST: 12 U/L (ref 0–37)
Albumin: 4.1 g/dL (ref 3.5–5.2)
BUN: 18 mg/dL (ref 6–23)
CHLORIDE: 99 meq/L (ref 96–112)
CO2: 34 mEq/L — ABNORMAL HIGH (ref 19–32)
Calcium: 9.8 mg/dL (ref 8.4–10.5)
Creatinine, Ser: 1.31 mg/dL (ref 0.40–1.50)
GFR: 74.08 mL/min (ref >60.00)
GLUCOSE: 119 mg/dL — AB (ref 70–99)
POTASSIUM: 3.5 meq/L (ref 3.5–5.1)
Sodium: 143 mEq/L (ref 135–145)
TOTAL PROTEIN: 6.6 g/dL (ref 6.0–8.3)
Total Bilirubin: 0.9 mg/dL (ref 0.2–1.2)

## 2015-06-03 LAB — CBC WITH DIFFERENTIAL/PLATELET
BASOS PCT: 0.8 % (ref 0.0–3.0)
Basophils Absolute: 0.1 10*3/uL (ref 0.0–0.1)
EOS PCT: 2.1 % (ref 0.0–5.0)
Eosinophils Absolute: 0.1 10*3/uL (ref 0.0–0.7)
HCT: 43.7 % (ref 39.0–52.0)
HEMOGLOBIN: 14.6 g/dL (ref 13.0–17.0)
LYMPHS ABS: 1.5 10*3/uL (ref 0.7–4.0)
Lymphocytes Relative: 24.2 % (ref 12.0–46.0)
MCHC: 33.4 g/dL (ref 30.0–36.0)
MCV: 93 fl (ref 78.0–100.0)
MONOS PCT: 7.8 % (ref 3.0–12.0)
Monocytes Absolute: 0.5 10*3/uL (ref 0.1–1.0)
Neutro Abs: 4.1 10*3/uL (ref 1.4–7.7)
Neutrophils Relative %: 65.1 % (ref 43.0–77.0)
Platelets: 303 10*3/uL (ref 150.0–400.0)
RBC: 4.7 Mil/uL (ref 4.22–5.81)
RDW: 14.6 % (ref 11.5–15.5)
WBC: 6.2 10*3/uL (ref 4.0–10.5)

## 2015-06-03 LAB — TSH: TSH: 2.39 u[IU]/mL (ref 0.35–4.50)

## 2015-06-03 LAB — FECAL OCCULT BLOOD, GUAIAC: FECAL OCCULT BLD: NEGATIVE

## 2015-06-03 LAB — HEMOGLOBIN A1C: Hgb A1c MFr Bld: 6.6 % — ABNORMAL HIGH (ref 4.6–6.5)

## 2015-06-03 LAB — PSA: PSA: 0.64 ng/mL (ref 0.10–4.00)

## 2015-06-03 MED ORDER — FENOFIBRATE 54 MG PO TABS: 54.0000 mg | ORAL_TABLET | Freq: Every day | ORAL | Status: DC

## 2015-06-03 NOTE — Progress Notes (Signed)
Pre visit review using our clinic review tool, if applicable. No additional management support is needed unless otherwise documented below in the visit note. 

## 2015-06-03 NOTE — Progress Notes (Signed)
Subjective:  Patient ID: Jay Chen, male    DOB: [birth year]  Age: 50 y.o. MRN: [id number]  CC: Hypertension; Annual Exam; and Diabetes   HPI Jay Chen. [name] presents for a CPX. He offers no new complaints today.  Outpatient Prescriptions Prior to Visit  Medication Sig Dispense Refill  . albuterol (PROVENTIL HFA;VENTOLIN HFA) 108 (90 BASE) MCG/ACT inhaler Inhale 2 puffs into the lungs every 6 (six) hours as needed for wheezing or shortness of breath.    . ALBUTEROL IN Inhale into the lungs as needed. 2 puffs     . atorvastatin (LIPITOR) 40 MG tablet TAKE 1 TABLET (40 MG TOTAL) BY MOUTH DAILY. 90 tablet 2  . Canagliflozin (INVOKANA) 300 MG TABS Take 1 tablet (300 mg total) by mouth daily. 90 tablet 3  . cloNIDine (CATAPRES) 0.1 MG tablet TAKE 1 TABLET BY MOUTH EVERY MORNING AND TAKE 2 TABLETS EVERY NIGHT 90 tablet 11  . colchicine 0.6 MG tablet Take 1 tablet (0.6 mg total) by mouth 2 (two) times daily. (Patient taking differently: Take 0.6 mg by mouth daily as needed. ) 60 tablet 3  . furosemide (LASIX) 40 MG tablet TAKE 1 TABLET DAILY 30 tablet 5  . glucose blood (BAYER CONTOUR NEXT TEST) test strip Use BID 100 each 12  . Lancets Misc. (UNISTIK 1) MISC Test up to twice daily 50 each 11  . MAG64 535 (64 MG) MG TBCR ONE BY MOUTH EVERY DAY 90 tablet 1  . Olmesartan-Amlodipine-HCTZ (TRIBENZOR) 40-10-25 MG TABS Take 1 tablet by mouth daily. 30 tablet 0  . omega-3 acid ethyl esters (LOVAZA) 1 G capsule Take 2 capsules (2 g total) by mouth 2 (two) times daily. 120 capsule 11  . potassium chloride (K-DUR) 10 MEQ tablet Take 2 tablets (20 mEq total) by mouth 2 (two) times daily. 20 tablet 0  . potassium chloride SA (KLOR-CON M20) 20 MEQ tablet Take 2 tablets (40 mEq total) by mouth daily. 60 tablet 5  . sitaGLIPtin-metformin (JANUMET) 50-500 MG per tablet Take 1 tablet by mouth 2 (two) times daily with a meal. 180 tablet 2  . fenofibrate 54 MG tablet Take 1 tablet (54 mg total) by mouth daily.  90 tablet 1   No facility-administered medications prior to visit.    ROS Review of Systems  Constitutional: Positive for fatigue and unexpected weight change. Negative for fever, chills, diaphoresis, activity change and appetite change.  HENT: Negative.  Negative for congestion, sinus pressure, sore throat and trouble swallowing.   Eyes: Negative.  Negative for visual disturbance.  Respiratory: Positive for apnea. Negative for cough, choking, chest tightness, shortness of breath, wheezing and stridor.   Cardiovascular: Negative.  Negative for chest pain, palpitations and leg swelling.  Gastrointestinal: Negative.  Negative for nausea, vomiting, abdominal pain, diarrhea, constipation and blood in stool.  Endocrine: Negative.  Negative for polydipsia, polyphagia and polyuria.  Genitourinary: Negative.  Negative for dysuria, urgency, frequency, hematuria, flank pain, decreased urine volume, difficulty urinating and penile pain.  Musculoskeletal: Negative.  Negative for myalgias, back pain, joint swelling, arthralgias and neck pain.  Skin: Negative.  Negative for color change and rash.  Allergic/Immunologic: Negative.   Neurological: Negative.  Negative for dizziness, tremors, seizures, speech difficulty, weakness, light-headedness and headaches.  Hematological: Negative.  Negative for adenopathy. Does not bruise/bleed easily.  Psychiatric/Behavioral: Negative.     Objective:  BP 120/80 mmHg  Pulse 69  Temp(Src) 98.1 F (36.7 C) (Oral)  Resp 16  Ht 5\' 8"  (1.727  m)  Wt 357 lb (161.934 kg)  BMI 54.29 kg/m2  SpO2 90%  BP Readings from Last 3 Encounters:  06/03/15 120/80  02/09/15 164/92  03/26/14 138/86    Wt Readings from Last 3 Encounters:  06/03/15 357 lb (161.934 kg)  02/08/15 270 lb (122.471 kg)  03/26/14 387 lb 1.9 oz (175.596 kg)    Physical Exam  Constitutional: He is oriented to person, place, and time. He appears well-developed and well-nourished. No distress.    HENT:  Head: Normocephalic and atraumatic.  Mouth/Throat: Oropharynx is clear and moist.  Eyes: Conjunctivae are normal. Right eye exhibits no discharge. Left eye exhibits no discharge. No scleral icterus.  Neck: Normal range of motion. Neck supple. No JVD present. No tracheal deviation present. No thyromegaly present.  Cardiovascular: Normal rate, regular rhythm, normal heart sounds and intact distal pulses.  Exam reveals no gallop and no friction rub.   No murmur heard. Pulmonary/Chest: Effort normal and breath sounds normal. No stridor. No respiratory distress. He has no wheezes. He has no rales. He exhibits no tenderness.  Abdominal: Soft. Bowel sounds are normal. He exhibits no distension and no mass. There is no tenderness. There is no rebound and no guarding. Hernia confirmed negative in the right inguinal area and confirmed negative in the left inguinal area.  Genitourinary: Rectum normal, prostate normal and testes normal. Rectal exam shows no external hemorrhoid, no internal hemorrhoid, no fissure, no mass, no tenderness and anal tone normal. Guaiac negative stool. Prostate is not enlarged and not tender. Right testis shows no mass, no swelling and no tenderness. Right testis is descended. Left testis shows no mass, no swelling and no tenderness. Left testis is descended. Uncircumcised. Phimosis present. No paraphimosis, hypospadias, penile erythema or penile tenderness. No discharge found.  Musculoskeletal: Normal range of motion. He exhibits no edema or tenderness.  Lymphadenopathy:    He has no cervical adenopathy.       Right: No inguinal adenopathy present.       Left: No inguinal adenopathy present.  Neurological: He is oriented to person, place, and time.  Skin: Skin is warm and dry. No rash noted. He is not diaphoretic. No erythema. No pallor.  Psychiatric: He has a normal mood and affect. His behavior is normal. Judgment and thought content normal.  Vitals reviewed.   Lab  Results  Component Value Date   WBC 6.2 06/03/2015   HGB 14.6 06/03/2015   HCT 43.7 06/03/2015   PLT 303.0 06/03/2015   GLUCOSE 119* 06/03/2015   CHOL 169 06/03/2015   TRIG 194.0* 06/03/2015   HDL 32.20* 06/03/2015   LDLCALC 98 06/03/2015   ALT 13 06/03/2015   AST 12 06/03/2015   NA 143 06/03/2015   K 3.5 06/03/2015   CL 99 06/03/2015   CREATININE 1.31 06/03/2015   BUN 18 06/03/2015   CO2 34* 06/03/2015   TSH 2.39 06/03/2015   PSA 0.64 06/03/2015   INR 1.01 09/19/2009   HGBA1C 6.6* 06/03/2015   MICROALBUR 7.5* 06/03/2015    Ct Head Wo Contrast  02/09/2015  CLINICAL DATA:  50 year old male with syncope and fall. EXAM: CT HEAD WITHOUT CONTRAST TECHNIQUE: Contiguous axial images were obtained from the base of the skull through the vertex without intravenous contrast. COMPARISON:  Head CT dated 09/27/2009 FINDINGS: The ventricles and the sulci are appropriate in size for the patient's age. There is no intracranial hemorrhage. No midline shift or mass effect identified. The gray-white matter differentiation is preserved. The visualized paranasal  sinuses and mastoid air cells are well aerated. Right maxillary sinus retention cyst or polyp. The calvarium is intact. IMPRESSION: No acute intracranial pathology. Electronically Signed   By: Anner Crete M.D.   On: 02/09/2015 01:00    Assessment & Plan:   Ajai was seen today for hypertension, annual exam and diabetes.  Diagnoses and all orders for this visit:  Encounter for immunization  Controlled type 2 diabetes mellitus with complication, without long-term current use of insulin ([place])-  His A1c is 6.6%, his blood sugars are well-controlled, no therapy needed at this time. -     fenofibrate 54 MG tablet; Take 1 tablet (54 mg total) by mouth daily. -     Microalbumin / creatinine urine ratio; Future -     Hemoglobin A1c; Future -     Ambulatory referral to Ophthalmology  Routine general medical examination at a health care  facility-  His vaccines were reviewed and updated, exam done, labs ordered and reviewed, was referred for screening colonoscopy, patient education material was given. -     Lipid panel; Future -     Hepatitis C antibody; Future -     TSH; Future -     Urinalysis, Routine w reflex microscopic (not at [facility] Future -     Comprehensive metabolic panel; Future -     CBC with Differential/Platelet; Future -     PSA; Future  Hyperlipidemia with target LDL less than 100-  He is achieved his LDL goal is doing well on the statin. -     fenofibrate 54 MG tablet; Take 1 tablet (54 mg total) by mouth daily.  Phimosis/redundant prepuce -     Ambulatory referral to Urology  Sleep apnea-  He needs an updated evaluation of this and to consider starting CPAP. -     Ambulatory referral to Pulmonology  Colon cancer screening -     Ambulatory referral to Gastroenterology  Other orders -     Flu Vaccine QUAD 36+ mos IM   I am having Mr. Vaclavik maintain his ALBUTEROL IN, glucose blood, UNISTIK 1, canagliflozin, colchicine, furosemide, omega-3 acid ethyl esters, sitaGLIPtin-metformin, atorvastatin, MAG64, albuterol, potassium chloride, cloNIDine, potassium chloride SA, Olmesartan-Amlodipine-HCTZ, and fenofibrate.  Meds ordered this encounter  Medications  . fenofibrate 54 MG tablet    Sig: Take 1 tablet (54 mg total) by mouth daily.    Dispense:  90 tablet    Refill:  1     Follow-up: Return in about 4 months (around 10/01/2015).  Scarlette Calico, Jay Chen

## 2015-06-03 NOTE — Patient Instructions (Signed)

## 2015-06-04 LAB — HEPATITIS C ANTIBODY: HCV Ab: NEGATIVE

## 2015-06-05 ENCOUNTER — Encounter: Payer: Self-pay | Admitting: Internal Medicine

## 2015-06-16 ENCOUNTER — Encounter: Payer: Self-pay | Admitting: Internal Medicine

## 2015-06-24 ENCOUNTER — Other Ambulatory Visit: Payer: Self-pay | Admitting: Internal Medicine

## 2015-07-18 ENCOUNTER — Telehealth: Payer: Self-pay | Admitting: Internal Medicine

## 2015-07-18 DIAGNOSIS — M109 Gout, unspecified: Secondary | ICD-10-CM

## 2015-07-18 DIAGNOSIS — Z794 Long term (current) use of insulin: Principal | ICD-10-CM

## 2015-07-18 DIAGNOSIS — E785 Hyperlipidemia, unspecified: Secondary | ICD-10-CM

## 2015-07-18 DIAGNOSIS — E118 Type 2 diabetes mellitus with unspecified complications: Secondary | ICD-10-CM

## 2015-07-18 DIAGNOSIS — E782 Mixed hyperlipidemia: Secondary | ICD-10-CM

## 2015-07-18 MED ORDER — FENOFIBRATE 54 MG PO TABS: 54.0000 mg | ORAL_TABLET | Freq: Every day | ORAL | Status: DC

## 2015-07-18 MED ORDER — MAGNESIUM CHLORIDE ER 535 (64 MG) MG PO TBCR: 1.0000 | EXTENDED_RELEASE_TABLET | Freq: Every day | ORAL | Status: DC

## 2015-07-18 MED ORDER — POTASSIUM CHLORIDE CRYS ER 20 MEQ PO TBCR: 40.0000 meq | EXTENDED_RELEASE_TABLET | Freq: Every day | ORAL | Status: DC

## 2015-07-18 NOTE — Telephone Encounter (Signed)
Patient states he was given a piece of paper with several written prescriptions at his last visit. He has misplaced these and would like another copy. Please call when ready for pick up.

## 2015-07-18 NOTE — Telephone Encounter (Signed)
Called pt and verified the prescription and pharmacy.

## 2015-08-05 ENCOUNTER — Institutional Professional Consult (permissible substitution): Payer: BLUE CROSS/BLUE SHIELD | Admitting: Pulmonary Disease

## 2015-08-07 ENCOUNTER — Ambulatory Visit (INDEPENDENT_AMBULATORY_CARE_PROVIDER_SITE_OTHER): Payer: BLUE CROSS/BLUE SHIELD | Admitting: Internal Medicine

## 2015-08-07 ENCOUNTER — Encounter: Payer: Self-pay | Admitting: Internal Medicine

## 2015-08-07 VITALS — BP 126/78 | HR 82 | Ht 68.0 in | Wt 371.0 lb

## 2015-08-07 DIAGNOSIS — G4733 Obstructive sleep apnea (adult) (pediatric): Secondary | ICD-10-CM | POA: Diagnosis not present

## 2015-08-07 NOTE — Patient Instructions (Signed)
Order- schedule sleep study-  prefer split protocol NPSG but accept HST     Dx OSA  Please call as needed

## 2015-08-07 NOTE — Assessment & Plan Note (Signed)
He has gained almost 50 pounds since original sleep study which was 12 years ago. He needs updated documentation. Plan-split protocol sleep study then return

## 2015-08-07 NOTE — Progress Notes (Signed)
08/07/2015-50 year old male never smoker referred courtesy ofDr Scarlette Calico; had sleep study 2005; wore CPAP through [facility] until about 4-5 years ago; could not get use to the CPAP or the mask; pressure was an issue as well. NPSG 2005- severe OSA, AHI 114/ hr with desaturation to 73%, body weight 320 lbs Has gained 50 lbs since original study. Couldn't tolerate CPAP mask and dropped off at that time. Wife tells him he snores loudly and he is aware of daytime sleepiness. Sodas for caffeine. Not aware of leg movement disturbance in sleep. Nocturia 3. Bedtime around midnight with 30 minutes sleep latency, up at 6:30 AM. Working in [employer]. Medical problems include hypertension, diabetes. Some history of allergic rhinitis. He denies heart, lung, thyroid disease. No ENT surgery.  Prior to Admission medications   Medication Sig Start Date End Date Taking? Authorizing Provider  albuterol (PROVENTIL HFA;VENTOLIN HFA) 108 (90 BASE) MCG/ACT inhaler Inhale 2 puffs into the lungs every 6 (six) hours as needed for wheezing or shortness of breath.   Yes Historical Provider, MD  atorvastatin (LIPITOR) 40 MG tablet TAKE 1 TABLET (40 MG TOTAL) BY MOUTH DAILY. 06/18/14  Yes [name], MD  Canagliflozin (INVOKANA) 300 MG TABS Take 1 tablet (300 mg total) by mouth daily. 07/18/13  Yes [name], MD  cloNIDine (CATAPRES) 0.1 MG tablet TAKE 1 TABLET BY MOUTH EVERY MORNING AND TAKE 2 TABLETS EVERY NIGHT 02/10/15  Yes [name], MD  colchicine 0.6 MG tablet Take 1 tablet (0.6 mg total) by mouth 2 (two) times daily. Patient taking differently: Take 0.6 mg by mouth daily as needed.  07/18/13  Yes [name], MD  fenofibrate 54 MG tablet Take 1 tablet (54 mg total) by mouth daily. 07/18/15  Yes [name], MD  furosemide (LASIX) 40 MG tablet TAKE 1 TABLET DAILY 07/18/13  Yes [name], MD  glucose blood (BAYER CONTOUR NEXT TEST) test strip Use BID 09/28/12  Yes [name], MD  Lancets Misc.  Patricia Pesa 1) MISC Test up to twice daily 09/28/12  Yes [name], MD  Magnesium Chloride (MAG64) 535 (64 Mg) MG TBCR Take 1 tablet (64 mg total) by mouth daily. 07/18/15  Yes [name], MD  Olmesartan-Amlodipine-HCTZ 40-10-25 MG TABS TAKE 1 TABLET BY MOUTH DAILY. 06/24/15  Yes [name], MD  omega-3 acid ethyl esters (LOVAZA) 1 G capsule Take 2 capsules (2 g total) by mouth 2 (two) times daily. 07/18/13  Yes [name], MD  potassium chloride SA (KLOR-CON M20) 20 MEQ tablet Take 2 tablets (40 mEq total) by mouth daily. 07/18/15  Yes [name], MD  sitaGLIPtin-metformin (JANUMET) 50-500 MG per tablet Take 1 tablet by mouth 2 (two) times daily with a meal. 07/18/13  Yes [name], MD   Past Medical History  Diagnosis Date  . Congestive heart failure [facility]) May of 2011    Felt to have cor pulmonale; EF 45 to 50% from echo in May 2011  . Hypertension   . Thoracic aneurysm     Followed by [name]  . Morbid obesity ([place])   . Diabetes mellitus   . Hyperlipidemia   . Sleep apnea    Past Surgical History  Procedure Laterality Date  . US echocardiography  09/21/2009    EF 45-50%; Cavity size was severely dilated, severe concentric hypertrophy and normal wall motion  . Cholecystectomy  2000   Family History  Problem Relation Age of  Onset  . Hypertension Mother   . Pancreatic cancer Father   . Prostate cancer Father   . Colon cancer Father    Social History   Social History  . Marital Status: Married    Spouse Name: N/A  . Number of Children: N/A  . Years of Education: N/A   Occupational History  . Not on file.   Social History Main Topics  . Smoking status: Never Smoker   . Smokeless tobacco: Never Used  . Alcohol Use: 3.0 oz/week    5 Cans of beer per week     Comment: Rarely  . Drug Use: No  . Sexual Activity: Not Currently   Other Topics Concern  . Not on file   Social History Narrative   ROS-see HPI   Negative unless "+" Constitutional:     weight loss, night sweats, fevers, chills, +fatigue, lassitude. HEENT:    headaches, difficulty swallowing, tooth/dental problems, sore throat,       sneezing, itching, ear ache, nasal congestion, post nasal drip, snoring CV:    chest pain, orthopnea, PND, swelling in lower extremities, anasarca,                                                               dizziness, palpitations Resp:   +shortness of breath with exertion or at rest.                productive cough,   non-productive cough, coughing up of blood.              change in color of mucus.  wheezing.   Skin:    rash or lesions. GI:  No-   heartburn, indigestion, abdominal pain, nausea, vomiting, diarrhea,                 change in bowel habits, loss of appetite GU: dysuria, change in color of urine, no urgency or frequency.   flank pain. MS:   joint pain, stiffness, decreased range of motion, back pain. Neuro-     nothing unusual Psych:  change in mood or affect.  depression or anxiety.   memory loss.  OBJ- Physical Exam General- Alert, Oriented, Affect-appropriate, Distress- none acute Skin- rash-none, lesions- none, excoriation- none Lymphadenopathy- none Head- atraumatic, +thick neck and tongue            Eyes- Gross vision intact, PERRLA, conjunctivae and secretions clear            Ears- Hearing, canals-normal            Nose- Clear, no-Septal dev, mucus, polyps, erosion, perforation             Throat- Mallampati III , mucosa clear , drainage- none, tonsils+ present Neck- flexible , trachea midline, no stridor , thyroid nl, carotid no bruit Chest - symmetrical excursion , unlabored           Heart/CV- RRR , no murmur , no gallop  , no rub, nl s1 s2                           - JVD- none , edema- none, stasis changes- none, varices- none           Lung- clear to  P&A, wheeze- none, cough- none , dullness-none, rub- none           Chest wall-  Abd-  Br/ Gen/ Rectal- Not done, not indicated Extrem- cyanosis- none,  clubbing, none, atrophy- none, strength- nl Neuro- grossly intact to observation

## 2015-08-07 NOTE — Assessment & Plan Note (Signed)
He is severely overweight, clearly impacting his several medical problems. Unfortunately he has gained most of 50 pounds since original sleep study was done in 2005. He may be an appropriate referral for bariatric evaluation and counseling.

## 2015-08-12 ENCOUNTER — Other Ambulatory Visit: Payer: Self-pay | Admitting: Internal Medicine

## 2015-09-22 ENCOUNTER — Encounter (HOSPITAL_BASED_OUTPATIENT_CLINIC_OR_DEPARTMENT_OTHER): Payer: BLUE CROSS/BLUE SHIELD

## 2015-10-29 ENCOUNTER — Telehealth: Payer: Self-pay | Admitting: *Deleted

## 2015-10-29 NOTE — Telephone Encounter (Signed)
Type Date User   General 07/18/2015 3:19 PM CAMPBELL, BRITTANY P        Note   Spoke w/pt. He will call to schedule.                       Type Date User   General 07/16/2015 8:33 AM CAMPBELL, BRITTANY P        Note   Left message for patient to call office.                       Type Date User   General 06/27/2015 10:06 AM SHIVES, DUSTIN T        Note   2/3 left message for call back to schedule - pt is overdue for COLON with Dr. Carlean Purl                       Type Date User   General 06/27/2015 10:01 AM Barb Merino L        Summary   Auto: Referral message        Note   ----- Message -----    From: Marlon Pel, RN    Sent: 06/27/2015 10:00 AM     To: Marvel Plan   Subject: RE: Recall COLON                     He should schedule now he is overdue. Polyps in 2007 were adenomatous      ----- Message -----    From: Marvel Plan    Sent: 06/27/2015  9:26 AM     To: Marlon Pel, RN   Subject: Recall COLON                       Patient had colonoscopy 09/2005, polyps were found. I see no letters, recalls, or indication of a time frame for Recall. Should patient wait for the 10 year, or is patient overdue? Advise

## 2015-11-14 ENCOUNTER — Ambulatory Visit (HOSPITAL_BASED_OUTPATIENT_CLINIC_OR_DEPARTMENT_OTHER): Payer: BLUE CROSS/BLUE SHIELD | Attending: Internal Medicine | Admitting: Internal Medicine

## 2015-11-14 VITALS — Ht 68.0 in | Wt 350.0 lb

## 2015-11-14 NOTE — Progress Notes (Deleted)
Subjective:     Patient ID: Jay Chen, male   DOB: [birth year], 50 y.o.   MRN: [id number]  HPI   Review of Systems     Objective:   Physical Exam     Assessment:     ***    Plan:     ***

## 2015-11-14 NOTE — Progress Notes (Deleted)
Subjective:     Patient ID: LIM JANET, male   DOB: [birth year], 50 y.o.   MRN: [id number]  HPI   Review of Systems     Objective:   Physical Exam     Assessment:     ***    Plan:     ***

## 2015-12-08 ENCOUNTER — Encounter: Payer: Self-pay | Admitting: Internal Medicine

## 2015-12-08 ENCOUNTER — Ambulatory Visit (INDEPENDENT_AMBULATORY_CARE_PROVIDER_SITE_OTHER): Payer: BLUE CROSS/BLUE SHIELD | Admitting: Internal Medicine

## 2015-12-08 VITALS — BP 144/86 | HR 80 | Ht 68.0 in | Wt 381.0 lb

## 2015-12-08 DIAGNOSIS — G4733 Obstructive sleep apnea (adult) (pediatric): Secondary | ICD-10-CM

## 2015-12-08 DIAGNOSIS — J310 Chronic rhinitis: Secondary | ICD-10-CM

## 2015-12-08 NOTE — Assessment & Plan Note (Addendum)
We are rescheduling sleep study to reestablish diagnosis so that we can initiate treatment Body habitus strongly suggests a component of obesity hypoventilation syndrome. Plan-schedule PFT

## 2015-12-08 NOTE — Patient Instructions (Addendum)
Reschedule sleep study- Ok either HST or split NPSG based on insurance requirement    Dx OSA  Order- schedule PFT     Dx dyspnea  Sample Dymista nasal spray    1-2 puffs each nostril once daily at bedtime  We will schedule you back for follow-up once we know when sleep study is to be done

## 2015-12-08 NOTE — Assessment & Plan Note (Signed)
Bariatric counseling advised

## 2015-12-08 NOTE — Assessment & Plan Note (Signed)
Nonspecific rhinitis, probably allergic Plan-sample Dymista

## 2015-12-08 NOTE — Progress Notes (Signed)
08/07/2015-50 year old male never smoker referred courtesy ofDr Scarlette Calico; had sleep study 2005; wore CPAP through [facility] until about 4-5 years ago; could not get use to the CPAP or the mask; pressure was an issue as well. NPSG 2005- severe OSA, AHI 114/ hr with desaturation to 73%, body weight 320 lbs Has gained 50 lbs since original study. Couldn't tolerate CPAP mask and dropped off at that time. Wife tells him he snores loudly and he is aware of daytime sleepiness. Sodas for caffeine. Not aware of leg movement disturbance in sleep. Nocturia 3. Bedtime around midnight with 30 minutes sleep latency, up at 6:30 AM. Working in [employer]. Medical problems include hypertension, diabetes. Some history of allergic rhinitis. He denies heart, lung, thyroid disease. No ENT surgery.  12/08/2015-50 year old male never smoker followed for OSA, complicated by morbid obesity, DM, HBP, allergic rhinitis, history thoracic aortic aneurysm He went for sleep study scheduled 11/14/2015 but study could not be done because he was "too congested". He is noted to have labored breathing with saturation 90% on room air at today's visit. FOLLOW FOR:  went in for sleep study, was too congested to do test.  Whenever he eats he gets sinus congestion.  states he has not had SOB recently.  Discuss options for sleep study, pt states that he thinks he has chronic congestion. Weight recorded today 381 pounds. Reports blowing a lot of clear mucus from his nose. Denies fever, purulent or bloody discharge. Normal thyroid function  ROS-see HPI   Negative unless "+" Constitutional:    weight loss, night sweats, fevers, chills, +fatigue, lassitude. HEENT:    headaches, difficulty swallowing, tooth/dental problems, sore throat,       sneezing, itching, ear ache, + nasal congestion, post nasal drip, snoring CV:    chest pain, orthopnea, PND, swelling in lower extremities, anasarca,                                                dizziness, palpitations Resp:   +shortness of breath with exertion or at rest.                productive cough,   non-productive cough, coughing up of blood.              change in color of mucus.  wheezing.   Skin:    rash or lesions. GI:  No-   heartburn, indigestion, abdominal pain, nausea, vomiting, diarrhea,                 change in bowel habits, loss of appetite GU: dysuria, change in color of urine, no urgency or frequency.   flank pain. MS:   joint pain, stiffness, decreased range of motion, back pain. Neuro-     nothing unusual Psych:  change in mood or affect.  depression or anxiety.   memory loss.  OBJ- Physical Exam General- Alert, Oriented, Affect-appropriate, Distress- none acute, + morbidly obese Skin- rash-none, lesions- none, excoriation- none Lymphadenopathy- none Head- atraumatic, +thick neck and tongue            Eyes- Gross vision intact, PERRLA, conjunctivae and secretions clear            Ears- Hearing, canals-normal            Nose- + turbinate edema, no-Septal dev, mucus, polyps, erosion, perforation  Throat- Mallampati III-IV , mucosa clear , drainage- none, tonsils+ present Neck- flexible , trachea midline, no stridor , thyroid nl, carotid no bruit Chest - symmetrical excursion , unlabored           Heart/CV- RRR , no murmur , no gallop  , no rub, nl s1 s2                           - JVD- none , edema- none, stasis changes- none, varices- none           Lung- clear to P&A, wheeze- none, cough- none , dullness-none, rub- none, 90% sat RA rest           Chest wall-  Abd-  Br/ Gen/ Rectal- Not done, not indicated Extrem- cyanosis- none, clubbing, none, atrophy- none, strength- nl Neuro- grossly intact to observation

## 2015-12-12 ENCOUNTER — Encounter: Payer: Self-pay | Admitting: Internal Medicine

## 2015-12-12 NOTE — Progress Notes (Signed)
Pt was unable to perform PFT today due to claustrophobia  . Per CY patient can be brought back in for 6MW on room air and Office Spiro at next OV. Pt will contact me directly to set up appointments/procedures once he has scheduled his split night study.   Pt has taken a business card of CY's with my information on it.

## 2016-02-27 ENCOUNTER — Other Ambulatory Visit: Payer: Self-pay | Admitting: Internal Medicine

## 2016-09-16 ENCOUNTER — Other Ambulatory Visit: Payer: Self-pay | Admitting: Internal Medicine

## 2016-09-27 ENCOUNTER — Other Ambulatory Visit: Payer: Self-pay | Admitting: Internal Medicine

## 2016-09-28 ENCOUNTER — Telehealth: Payer: Self-pay | Admitting: *Deleted

## 2016-09-28 MED ORDER — OLMESARTAN-AMLODIPINE-HCTZ 40-10-25 MG PO TABS: 1.0000 | ORAL_TABLET | Freq: Every day | ORAL | 0 refills | Status: DC

## 2016-09-28 NOTE — Telephone Encounter (Signed)
Rec'd call pt states his pharmacy has been trying to get refill on his Tribenzor for his BP. Inform pt per chart MD decline refill because he is a=overdue for appt. Pt has made appt for 5/22, and since he is out of medication inform will send 2 week supply to [facility]...Jay Chen

## 2016-10-12 ENCOUNTER — Other Ambulatory Visit (INDEPENDENT_AMBULATORY_CARE_PROVIDER_SITE_OTHER): Payer: BLUE CROSS/BLUE SHIELD

## 2016-10-12 ENCOUNTER — Encounter: Payer: Self-pay | Admitting: Internal Medicine

## 2016-10-12 ENCOUNTER — Telehealth: Payer: Self-pay

## 2016-10-12 ENCOUNTER — Ambulatory Visit (INDEPENDENT_AMBULATORY_CARE_PROVIDER_SITE_OTHER): Payer: BLUE CROSS/BLUE SHIELD | Admitting: Internal Medicine

## 2016-10-12 VITALS — BP 138/88 | HR 77 | Temp 99.1°F | Resp 16 | Ht 68.0 in | Wt 384.5 lb

## 2016-10-12 DIAGNOSIS — I712 Thoracic aortic aneurysm, without rupture, unspecified: Secondary | ICD-10-CM

## 2016-10-12 DIAGNOSIS — R7989 Other specified abnormal findings of blood chemistry: Secondary | ICD-10-CM | POA: Diagnosis not present

## 2016-10-12 DIAGNOSIS — E876 Hypokalemia: Secondary | ICD-10-CM

## 2016-10-12 DIAGNOSIS — E785 Hyperlipidemia, unspecified: Secondary | ICD-10-CM

## 2016-10-12 DIAGNOSIS — E118 Type 2 diabetes mellitus with unspecified complications: Secondary | ICD-10-CM

## 2016-10-12 DIAGNOSIS — M1000 Idiopathic gout, unspecified site: Secondary | ICD-10-CM

## 2016-10-12 DIAGNOSIS — I10 Essential (primary) hypertension: Secondary | ICD-10-CM

## 2016-10-12 DIAGNOSIS — I2781 Cor pulmonale (chronic): Secondary | ICD-10-CM | POA: Diagnosis not present

## 2016-10-12 DIAGNOSIS — G4733 Obstructive sleep apnea (adult) (pediatric): Secondary | ICD-10-CM | POA: Diagnosis not present

## 2016-10-12 DIAGNOSIS — Z0000 Encounter for general adult medical examination without abnormal findings: Secondary | ICD-10-CM | POA: Diagnosis not present

## 2016-10-12 LAB — CBC WITH DIFFERENTIAL/PLATELET
BASOS PCT: 0.7 % (ref 0.0–3.0)
Basophils Absolute: 0 10*3/uL (ref 0.0–0.1)
EOS ABS: 0.1 10*3/uL (ref 0.0–0.7)
Eosinophils Relative: 1.9 % (ref 0.0–5.0)
HCT: 45 % (ref 39.0–52.0)
HEMOGLOBIN: 15.2 g/dL (ref 13.0–17.0)
Lymphocytes Relative: 28.1 % (ref 12.0–46.0)
Lymphs Abs: 1.7 10*3/uL (ref 0.7–4.0)
MCHC: 33.7 g/dL (ref 30.0–36.0)
MCV: 93.5 fl (ref 78.0–100.0)
MONO ABS: 0.5 10*3/uL (ref 0.1–1.0)
Monocytes Relative: 8.4 % (ref 3.0–12.0)
Neutro Abs: 3.7 10*3/uL (ref 1.4–7.7)
Neutrophils Relative %: 60.9 % (ref 43.0–77.0)
PLATELETS: 282 10*3/uL (ref 150.0–400.0)
RBC: 4.82 Mil/uL (ref 4.22–5.81)
RDW: 14.2 % (ref 11.5–15.5)
WBC: 6.1 10*3/uL (ref 4.0–10.5)

## 2016-10-12 LAB — COMPREHENSIVE METABOLIC PANEL
ALBUMIN: 3.9 g/dL (ref 3.5–5.2)
ALK PHOS: 84 U/L (ref 39–117)
ALT: 16 U/L (ref 0–53)
AST: 14 U/L (ref 0–37)
BUN: 13 mg/dL (ref 6–23)
CHLORIDE: 93 meq/L — AB (ref 96–112)
CO2: 39 mEq/L — ABNORMAL HIGH (ref 19–32)
CREATININE: 1.04 mg/dL (ref 0.40–1.50)
Calcium: 8.6 mg/dL (ref 8.4–10.5)
GFR: 96.18 mL/min (ref >60.00)
GLUCOSE: 193 mg/dL — AB (ref 70–99)
Potassium: 3.3 mEq/L — ABNORMAL LOW (ref 3.5–5.1)
SODIUM: 141 meq/L (ref 135–145)
TOTAL PROTEIN: 6.8 g/dL (ref 6.0–8.3)
Total Bilirubin: 0.8 mg/dL (ref 0.2–1.2)

## 2016-10-12 LAB — URINALYSIS, ROUTINE W REFLEX MICROSCOPIC
Hgb urine dipstick: NEGATIVE
Leukocytes, UA: NEGATIVE
Nitrite: NEGATIVE
PH: 6.5 (ref 5.0–8.0)
Specific Gravity, Urine: 1.015 (ref 1.000–1.030)
Total Protein, Urine: 30 — AB
UROBILINOGEN UA: 1 (ref 0.0–1.0)
Urine Glucose: NEGATIVE

## 2016-10-12 LAB — URIC ACID: URIC ACID, SERUM: 9.3 mg/dL — AB (ref 4.0–7.8)

## 2016-10-12 LAB — LDL CHOLESTEROL, DIRECT: LDL DIRECT: 104 mg/dL

## 2016-10-12 LAB — LIPID PANEL
CHOL/HDL RATIO: 6
CHOLESTEROL: 197 mg/dL (ref 0–200)
HDL: 34.1 mg/dL — ABNORMAL LOW (ref >39.00)
NonHDL: 163.08
TRIGLYCERIDES: 229 mg/dL — AB (ref 0.0–149.0)
VLDL: 45.8 mg/dL — ABNORMAL HIGH (ref 0.0–40.0)

## 2016-10-12 LAB — MAGNESIUM: MAGNESIUM: 1.1 mg/dL — AB (ref 1.5–2.5)

## 2016-10-12 LAB — MICROALBUMIN / CREATININE URINE RATIO
CREATININE, U: 334 mg/dL
MICROALB UR: 17.4 mg/dL — AB (ref 0.0–1.9)
MICROALB/CREAT RATIO: 5.2 mg/g (ref 0.0–30.0)

## 2016-10-12 LAB — HEMOGLOBIN A1C: HEMOGLOBIN A1C: 8.3 % — AB (ref 4.6–6.5)

## 2016-10-12 LAB — TSH: TSH: 2.75 u[IU]/mL (ref 0.35–4.50)

## 2016-10-12 LAB — PSA: PSA: 1.8 ng/mL (ref 0.10–4.00)

## 2016-10-12 MED ORDER — ASPIRIN EC 81 MG PO TBEC: 81.0000 mg | DELAYED_RELEASE_TABLET | Freq: Every day | ORAL | 3 refills | Status: DC

## 2016-10-12 MED ORDER — FEBUXOSTAT 40 MG PO TABS: 40.0000 mg | ORAL_TABLET | Freq: Every day | ORAL | 1 refills | Status: DC

## 2016-10-12 MED ORDER — POTASSIUM CHLORIDE CRYS ER 20 MEQ PO TBCR: 40.0000 meq | EXTENDED_RELEASE_TABLET | Freq: Every day | ORAL | 1 refills | Status: DC

## 2016-10-12 MED ORDER — ATORVASTATIN CALCIUM 40 MG PO TABS: 40.0000 mg | ORAL_TABLET | Freq: Every day | ORAL | 3 refills | Status: DC

## 2016-10-12 MED ORDER — OLMESARTAN-AMLODIPINE-HCTZ 40-10-25 MG PO TABS: 1.0000 | ORAL_TABLET | Freq: Every day | ORAL | 1 refills | Status: DC

## 2016-10-12 MED ORDER — MAGNESIUM OXIDE 400 MG PO TABS: 400.0000 mg | ORAL_TABLET | Freq: Every day | ORAL | 1 refills | Status: DC

## 2016-10-12 MED ORDER — DAPAGLIFLOZIN PROPANEDIOL 5 MG PO TABS: 5.0000 mg | ORAL_TABLET | Freq: Every day | ORAL | 1 refills | Status: DC

## 2016-10-12 MED ORDER — METFORMIN HCL 1000 MG PO TABS: 1000.0000 mg | ORAL_TABLET | Freq: Two times a day (BID) | ORAL | 1 refills | Status: DC

## 2016-10-12 NOTE — Progress Notes (Signed)
Subjective:  Patient ID: Jay Chen, male    DOB: 03/12/1966  Age: 51 y.o. MRN: [id number]  CC: Annual Exam; Hypertension; Hyperlipidemia; and Diabetes   HPI TRYONE KILLE presents for a CPX and f/up on medical concerns.  He has not recently been compliant with several of his medications. He complains of chronic, unchanged shortness of breath and DOE. He said no recent episodes of edema, chest pain, palpitations, diaphoresis. He does not monitor his blood pressure or his blood sugars.  Outpatient Medications Prior to Visit  Medication Sig Dispense Refill  . albuterol (PROVENTIL HFA;VENTOLIN HFA) 108 (90 BASE) MCG/ACT inhaler Inhale 2 puffs into the lungs every 6 (six) hours as needed for wheezing or shortness of breath.    . cloNIDine (CATAPRES) 0.1 MG tablet TAKE ONE TABLET BY MOUTH ONCE DAILY IN THE MORNING AND TWO ONCE DAILY AT  NIGHT 270 tablet 1  . fenofibrate 54 MG tablet Take 1 tablet (54 mg total) by mouth daily. 90 tablet 1  . glucose blood (BAYER CONTOUR NEXT TEST) test strip Use BID 100 each 12  . Lancets Misc. (UNISTIK 1) MISC Test up to twice daily 50 each 11  . omega-3 acid ethyl esters (LOVAZA) 1 G capsule Take 2 capsules (2 g total) by mouth 2 (two) times daily. 120 capsule 11  . atorvastatin (LIPITOR) 40 MG tablet Take 1 tablet (40 mg total) by mouth daily. 90 tablet 3  . Canagliflozin (INVOKANA) 300 MG TABS Take 1 tablet (300 mg total) by mouth daily. 90 tablet 3  . colchicine 0.6 MG tablet Take 1 tablet (0.6 mg total) by mouth 2 (two) times daily. (Patient taking differently: Take 0.6 mg by mouth daily as needed. ) 60 tablet 3  . furosemide (LASIX) 40 MG tablet TAKE 1 TABLET DAILY 30 tablet 5  . Magnesium Chloride (MAG64) 535 (64 Mg) MG TBCR Take 1 tablet (64 mg total) by mouth daily. 90 tablet 1  . Olmesartan-Amlodipine-HCTZ 40-10-25 MG TABS Take 1 tablet by mouth daily. Must keep May 22nd appt for future refills 14 tablet 0  . potassium chloride SA (KLOR-CON M20)  20 MEQ tablet Take 2 tablets (40 mEq total) by mouth daily. 180 tablet 1  . sitaGLIPtin-metformin (JANUMET) 50-500 MG per tablet Take 1 tablet by mouth 2 (two) times daily with a meal. 180 tablet 2   No facility-administered medications prior to visit.     ROS Review of Systems  Constitutional: Negative for appetite change, chills, diaphoresis, fatigue and unexpected weight change.  HENT: Negative.  Negative for sinus pressure and trouble swallowing.   Eyes: Negative.  Negative for visual disturbance.  Respiratory: Positive for apnea and shortness of breath. Negative for cough, choking, chest tightness, wheezing and stridor.   Cardiovascular: Negative for chest pain, palpitations and leg swelling.  Gastrointestinal: Negative for abdominal pain, constipation, diarrhea, nausea and vomiting.  Endocrine: Negative.  Negative for cold intolerance, heat intolerance, polydipsia, polyphagia and polyuria.  Genitourinary: Negative.  Negative for decreased urine volume, difficulty urinating, hematuria, scrotal swelling, testicular pain and urgency.  Musculoskeletal: Negative for back pain, myalgias and neck pain.  Skin: Negative.  Negative for color change and rash.  Allergic/Immunologic: Negative.   Neurological: Negative.  Negative for dizziness, weakness, numbness and headaches.  Hematological: Negative for adenopathy. Does not bruise/bleed easily.  Psychiatric/Behavioral: Negative.     Objective:  BP 138/88 (BP Location: Left Arm, Patient Position: Sitting, Cuff Size: Normal)   Pulse 77   Temp 99.1 F (  37.3 C) (Oral)   Resp 16   Ht 5\' 8"  (1.727 m)   Wt (!) 384 lb 8 oz (174.4 kg)   SpO2 (!) 89%   BMI 58.46 kg/m   BP Readings from Last 3 Encounters:  10/12/16 138/88  12/08/15 (!) 144/86  08/07/15 126/78    Wt Readings from Last 3 Encounters:  10/12/16 (!) 384 lb 8 oz (174.4 kg)  12/08/15 (!) 381 lb (172.8 kg)  11/14/15 (!) 350 lb (158.8 kg)    Physical Exam  Constitutional: No  distress.  HENT:  Mouth/Throat: Oropharynx is clear and moist. No oropharyngeal exudate.  Eyes: Conjunctivae are normal. Right eye exhibits no discharge. Left eye exhibits no discharge. No scleral icterus.  Neck: Neck supple. No JVD present. No tracheal deviation present. No thyromegaly present.  Cardiovascular: Normal rate, regular rhythm, normal heart sounds and intact distal pulses.  Exam reveals no gallop and no friction rub.   No murmur heard. Pulmonary/Chest: Effort normal and breath sounds normal. No stridor. No respiratory distress. He has no wheezes. He has no rales. He exhibits no tenderness.  Abdominal: Soft. Bowel sounds are normal. He exhibits no distension and no mass. There is no tenderness. There is no rebound and no guarding. Hernia confirmed negative in the right inguinal area and confirmed negative in the left inguinal area.  Genitourinary: Rectum normal, prostate normal, testes normal and penis normal. Rectal exam shows no external hemorrhoid, no internal hemorrhoid, no fissure, no mass, no tenderness, anal tone normal and guaiac negative stool. Prostate is not enlarged and not tender. Right testis shows no mass, no swelling and no tenderness. Right testis is descended. Left testis shows no mass, no swelling and no tenderness. Left testis is descended. Circumcised.  Lymphadenopathy:    He has no cervical adenopathy.       Right: No inguinal adenopathy present.       Left: No inguinal adenopathy present.  Skin: He is not diaphoretic.  Vitals reviewed.   Lab Results  Component Value Date   WBC 6.1 10/12/2016   HGB 15.2 10/12/2016   HCT 45.0 10/12/2016   PLT 282.0 10/12/2016   GLUCOSE 193 (H) 10/12/2016   CHOL 197 10/12/2016   TRIG 229.0 (H) 10/12/2016   HDL 34.10 (L) 10/12/2016   LDLDIRECT 104.0 10/12/2016   LDLCALC 98 06/03/2015   ALT 16 10/12/2016   AST 14 10/12/2016   NA 141 10/12/2016   K 3.3 (L) 10/12/2016   CL 93 (L) 10/12/2016   CREATININE 1.04 10/12/2016     BUN 13 10/12/2016   CO2 39 (H) 10/12/2016   TSH 2.75 10/12/2016   PSA 1.80 10/12/2016   INR 1.01 09/19/2009   HGBA1C 8.3 (H) 10/12/2016   MICROALBUR 17.4 (H) 10/12/2016    Ct Head Wo Contrast  Result Date: 02/09/2015 CLINICAL DATA:  51 year old male with syncope and fall. EXAM: CT HEAD WITHOUT CONTRAST TECHNIQUE: Contiguous axial images were obtained from the base of the skull through the vertex without intravenous contrast. COMPARISON:  Head CT dated 09/27/2009 FINDINGS: The ventricles and the sulci are appropriate in size for the patient's age. There is no intracranial hemorrhage. No midline shift or mass effect identified. The gray-white matter differentiation is preserved. The visualized paranasal sinuses and mastoid air cells are well aerated. Right maxillary sinus retention cyst or polyp. The calvarium is intact. IMPRESSION: No acute intracranial pathology. Electronically Signed   By: Anner Crete M.D.   On: 02/09/2015 01:00    Assessment &  Plan:   Iliya was seen today for annual exam, hypertension, hyperlipidemia and diabetes.  Diagnoses and all orders for this visit:  Essential hypertension- his blood pressure is not adequately well controlled, will restart the combination of an ARB, CCB, thiazide diuretic. Will also replace the low potassium and magnesium levels. -     Comprehensive metabolic panel; Future -     CBC with Differential/Platelet; Future -     TSH; Future -     Urinalysis, Routine w reflex microscopic; Future -     Olmesartan-Amlodipine-HCTZ 40-10-25 MG TABS; Take 1 tablet by mouth daily. Must keep May 22nd appt for future refills -     potassium chloride SA (KLOR-CON M20) 20 MEQ tablet; Take 2 tablets (40 mEq total) by mouth daily.  Controlled type 2 diabetes mellitus with complication, without long-term current use of insulin ([place])- his A1c is up to 8.3%, will restart hypoglycemic agents with metformin and an SGLT2 inhibitor. -     Comprehensive metabolic  panel; Future -     Hemoglobin A1c; Future -     Ambulatory referral to Ophthalmology -     aspirin EC 81 MG tablet; Take 1 tablet (81 mg total) by mouth daily. -     atorvastatin (LIPITOR) 40 MG tablet; Take 1 tablet (40 mg total) by mouth daily. -     dapagliflozin propanediol (FARXIGA) 5 MG TABS tablet; Take 5 mg by mouth daily. -     metFORMIN (GLUCOPHAGE) 1000 MG tablet; Take 1 tablet (1,000 mg total) by mouth 2 (two) times daily with a meal.  Hypokalemia- he has low magnesium and potassium, will restart potassium and magnesium replacement therapy. -     Comprehensive metabolic panel; Future -     Microalbumin / creatinine urine ratio; Future -     Magnesium; Future -     magnesium oxide (MAG-OX) 400 MG tablet; Take 1 tablet (400 mg total) by mouth daily. -     potassium chloride SA (KLOR-CON M20) 20 MEQ tablet; Take 2 tablets (40 mEq total) by mouth daily.  Hyperlipidemia with target LDL less than 100- he has achieved his LDL goal is doing well on the statin. -     TSH; Future -     atorvastatin (LIPITOR) 40 MG tablet; Take 1 tablet (40 mg total) by mouth daily.  Routine general medical examination at a health care facility- exam completed, labs ordered and reviewed, vaccines reviewed, to screen for colon pathology will obtain a ColoGuard, patient education material was given. -     Lipid panel; Future -     PSA; Future  Idiopathic gout, unspecified chronicity, unspecified site- his uric acid level is elevated at 9.3, he is at high risk of having a gout attack so I have asked him to start taking a XO inhibitor to lower his uric acid level and to prevent the development of gouty arthropathy. -     Uric acid; Future -     febuxostat (ULORIC) 40 MG tablet; Take 1 tablet (40 mg total) by mouth daily.  Obstructive sleep apnea- he is due for follow-up of sleep medicine -     Ambulatory referral to Pulmonology  [facility])- he is due for follow-up with cardiology -      Ambulatory referral to Cardiology -     Ambulatory referral to Pulmonology  Thoracic aortic aneurysm without rupture [facility]) -     Ambulatory referral to Cardiology   I have discontinued Mr. Witkop  canagliflozin, colchicine, furosemide, sitaGLIPtin-metformin, and Magnesium Chloride. I am also having him start on aspirin EC, magnesium oxide, dapagliflozin propanediol, metFORMIN, and febuxostat. Additionally, I am having him maintain his glucose blood, UNISTIK 1, omega-3 acid ethyl esters, albuterol, fenofibrate, cloNIDine, atorvastatin, Olmesartan-Amlodipine-HCTZ, and potassium chloride SA.  Meds ordered this encounter  Medications  . aspirin EC 81 MG tablet    Sig: Take 1 tablet (81 mg total) by mouth daily.    Dispense:  90 tablet    Refill:  3  . atorvastatin (LIPITOR) 40 MG tablet    Sig: Take 1 tablet (40 mg total) by mouth daily.    Dispense:  90 tablet    Refill:  3  . Olmesartan-Amlodipine-HCTZ 40-10-25 MG TABS    Sig: Take 1 tablet by mouth daily. Must keep May 22nd appt for future refills    Dispense:  90 tablet    Refill:  1  . magnesium oxide (MAG-OX) 400 MG tablet    Sig: Take 1 tablet (400 mg total) by mouth daily.    Dispense:  90 tablet    Refill:  1  . potassium chloride SA (KLOR-CON M20) 20 MEQ tablet    Sig: Take 2 tablets (40 mEq total) by mouth daily.    Dispense:  180 tablet    Refill:  1  . dapagliflozin propanediol (FARXIGA) 5 MG TABS tablet    Sig: Take 5 mg by mouth daily.    Dispense:  90 tablet    Refill:  1  . metFORMIN (GLUCOPHAGE) 1000 MG tablet    Sig: Take 1 tablet (1,000 mg total) by mouth 2 (two) times daily with a meal.    Dispense:  180 tablet    Refill:  1  . febuxostat (ULORIC) 40 MG tablet    Sig: Take 1 tablet (40 mg total) by mouth daily.    Dispense:  90 tablet    Refill:  1     Follow-up: Return in about 4 months (around 02/12/2017).  Scarlette Calico, MD

## 2016-10-12 NOTE — Telephone Encounter (Signed)
Order [id number]

## 2016-10-12 NOTE — Patient Instructions (Signed)
 Health Maintenance, Male A healthy lifestyle and preventive care is important for your health and wellness. Ask your health care provider about what schedule of regular examinations is right for you. What should I know about weight and diet?  Eat a Healthy Diet  Eat plenty of vegetables, fruits, whole grains, low-fat dairy products, and lean protein.  Do not eat a lot of foods high in solid fats, added sugars, or salt. Maintain a Healthy Weight  Regular exercise can help you achieve or maintain a healthy weight. You should:  Do at least 150 minutes of exercise each week. The exercise should increase your heart rate and make you sweat (moderate-intensity exercise).  Do strength-training exercises at least twice a week. Watch Your Levels of Cholesterol and Blood Lipids  Have your blood tested for lipids and cholesterol every 5 years starting at 53 years of age. If you are at high risk for heart disease, you should start having your blood tested when you are 53 years old. You may need to have your cholesterol levels checked more often if:  Your lipid or cholesterol levels are high.  You are older than 53 years of age.  You are at high risk for heart disease. What should I know about cancer screening? Many types of cancers can be detected early and may often be prevented. Lung Cancer  You should be screened every year for lung cancer if:  You are a current smoker who has smoked for at least 30 years.  You are a former smoker who has quit within the past 15 years.  Talk to your health care provider about your screening options, when you should start screening, and how often you should be screened. Colorectal Cancer  Routine colorectal cancer screening usually begins at 53 years of age and should be repeated every 5-10 years until you are 53 years old. You may need to be screened more often if early forms of precancerous polyps or small growths are found. Your health care provider  may recommend screening at an earlier age if you have risk factors for colon cancer.  Your health care provider may recommend using home test kits to check for hidden blood in the stool.  A small camera at the end of a tube can be used to examine your colon (sigmoidoscopy or colonoscopy). This checks for the earliest forms of colorectal cancer. Prostate and Testicular Cancer  Depending on your age and overall health, your health care provider may do certain tests to screen for prostate and testicular cancer.  Talk to your health care provider about any symptoms or concerns you have about testicular or prostate cancer. Skin Cancer  Check your skin from head to toe regularly.  Tell your health care provider about any new moles or changes in moles, especially if:  There is a change in a mole's size, shape, or color.  You have a mole that is larger than a pencil eraser.  Always use sunscreen. Apply sunscreen liberally and repeat throughout the day.  Protect yourself by wearing long sleeves, pants, a wide-brimmed hat, and sunglasses when outside. What should I know about heart disease, diabetes, and high blood pressure?  If you are 18-39 years of age, have your blood pressure checked every 3-5 years. If you are 40 years of age or older, have your blood pressure checked every year. You should have your blood pressure measured twice-once when you are at a hospital or clinic, and once when you are not at   a hospital or clinic. Record the average of the two measurements. To check your blood pressure when you are not at a hospital or clinic, you can use:  An automated blood pressure machine at a pharmacy.  A home blood pressure monitor.  Talk to your health care provider about your target blood pressure.  If you are between 45-79 years old, ask your health care provider if you should take aspirin to prevent heart disease.  Have regular diabetes screenings by checking your fasting blood sugar  level.  If you are at a normal weight and have a low risk for diabetes, have this test once every three years after the age of 45.  If you are overweight and have a high risk for diabetes, consider being tested at a younger age or more often.  A one-time screening for abdominal aortic aneurysm (AAA) by ultrasound is recommended for men aged 65-75 years who are current or former smokers. What should I know about preventing infection? Hepatitis B  If you have a higher risk for hepatitis B, you should be screened for this virus. Talk with your health care provider to find out if you are at risk for hepatitis B infection. Hepatitis C  Blood testing is recommended for:  Everyone born from 1945 through 1965.  Anyone with known risk factors for hepatitis C. Sexually Transmitted Diseases (STDs)  You should be screened each year for STDs including gonorrhea and chlamydia if:  You are sexually active and are younger than 53 years of age.  You are older than 53 years of age and your health care provider tells you that you are at risk for this type of infection.  Your sexual activity has changed since you were last screened and you are at an increased risk for chlamydia or gonorrhea. Ask your health care provider if you are at risk.  Talk with your health care provider about whether you are at high risk of being infected with HIV. Your health care provider may recommend a prescription medicine to help prevent HIV infection. What else can I do?  Schedule regular health, dental, and eye exams.  Stay current with your vaccines (immunizations).  Do not use any tobacco products, such as cigarettes, chewing tobacco, and e-cigarettes. If you need help quitting, ask your health care provider.  Limit alcohol intake to no more than 2 drinks per day. One drink equals 12 ounces of beer, 5 ounces of wine, or 1 ounces of hard liquor.  Do not use street drugs.  Do not share needles.  Ask your health  care provider for help if you need support or information about quitting drugs.  Tell your health care provider if you often feel depressed.  Tell your health care provider if you have ever been abused or do not feel safe at home. This information is not intended to replace advice given to you by your health care provider. Make sure you discuss any questions you have with your health care provider. Document Released: 11/06/2007 Document Revised: 01/07/2016 Document Reviewed: 02/11/2015 Elsevier Interactive Patient Education  2017 [employer]  

## 2016-10-13 ENCOUNTER — Encounter: Payer: Self-pay | Admitting: Internal Medicine

## 2016-11-03 NOTE — Telephone Encounter (Signed)
Kit delivered to patient

## 2017-02-03 ENCOUNTER — Other Ambulatory Visit: Payer: Self-pay | Admitting: Internal Medicine

## 2017-06-14 ENCOUNTER — Other Ambulatory Visit: Payer: Self-pay | Admitting: Internal Medicine

## 2017-07-08 NOTE — Telephone Encounter (Signed)
Order cancelled

## 2017-08-22 ENCOUNTER — Other Ambulatory Visit: Payer: Self-pay | Admitting: Internal Medicine

## 2017-08-22 DIAGNOSIS — I10 Essential (primary) hypertension: Secondary | ICD-10-CM

## 2017-08-25 ENCOUNTER — Telehealth: Payer: Self-pay | Admitting: Internal Medicine

## 2017-08-25 DIAGNOSIS — I10 Essential (primary) hypertension: Secondary | ICD-10-CM

## 2017-08-26 MED ORDER — OLMESARTAN-AMLODIPINE-HCTZ 40-10-25 MG PO TABS: 1.0000 | ORAL_TABLET | Freq: Every day | ORAL | 0 refills | Status: DC

## 2017-08-26 NOTE — Telephone Encounter (Signed)
Pt has appt scheduled for 09/01/2017. erx sent for 30 day supply.

## 2017-08-26 NOTE — Addendum Note (Signed)
Addended by: Aviva Signs M on: 08/26/2017 02:26 PM   Modules accepted: Orders

## 2017-08-26 NOTE — Telephone Encounter (Signed)
Pt called to say he has made an apt for next week  And wants to know if he can get the medication refilled now,  He states he is out of the medication.   Olmesartan-Amlodipine-HCTZ 40-10-25 MG [address] [place], [place] [phone] (Phone) [phone] (Fax)

## 2017-09-01 ENCOUNTER — Other Ambulatory Visit (INDEPENDENT_AMBULATORY_CARE_PROVIDER_SITE_OTHER): Payer: BC Managed Care – PPO

## 2017-09-01 ENCOUNTER — Ambulatory Visit: Payer: BC Managed Care – PPO | Admitting: Internal Medicine

## 2017-09-01 ENCOUNTER — Encounter: Payer: Self-pay | Admitting: Internal Medicine

## 2017-09-01 VITALS — BP 142/92 | HR 90 | Temp 98.5°F | Resp 16 | Ht 68.0 in | Wt 382.0 lb

## 2017-09-01 DIAGNOSIS — E785 Hyperlipidemia, unspecified: Secondary | ICD-10-CM

## 2017-09-01 DIAGNOSIS — E781 Pure hyperglyceridemia: Secondary | ICD-10-CM

## 2017-09-01 DIAGNOSIS — E876 Hypokalemia: Secondary | ICD-10-CM

## 2017-09-01 DIAGNOSIS — E118 Type 2 diabetes mellitus with unspecified complications: Secondary | ICD-10-CM | POA: Diagnosis not present

## 2017-09-01 DIAGNOSIS — M109 Gout, unspecified: Secondary | ICD-10-CM

## 2017-09-01 DIAGNOSIS — Z794 Long term (current) use of insulin: Secondary | ICD-10-CM | POA: Diagnosis not present

## 2017-09-01 DIAGNOSIS — I4819 Other persistent atrial fibrillation: Secondary | ICD-10-CM | POA: Insufficient documentation

## 2017-09-01 DIAGNOSIS — Z23 Encounter for immunization: Secondary | ICD-10-CM | POA: Diagnosis not present

## 2017-09-01 DIAGNOSIS — I10 Essential (primary) hypertension: Secondary | ICD-10-CM | POA: Diagnosis not present

## 2017-09-01 DIAGNOSIS — Z1211 Encounter for screening for malignant neoplasm of colon: Secondary | ICD-10-CM

## 2017-09-01 DIAGNOSIS — I481 Persistent atrial fibrillation: Secondary | ICD-10-CM | POA: Diagnosis not present

## 2017-09-01 DIAGNOSIS — I499 Cardiac arrhythmia, unspecified: Secondary | ICD-10-CM | POA: Diagnosis not present

## 2017-09-01 LAB — MICROALBUMIN / CREATININE URINE RATIO
Creatinine,U: 245.8 mg/dL
MICROALB/CREAT RATIO: 12.7 mg/g (ref 0.0–30.0)
Microalb, Ur: 31.2 mg/dL — ABNORMAL HIGH (ref 0.0–1.9)

## 2017-09-01 LAB — URINALYSIS, ROUTINE W REFLEX MICROSCOPIC
Bilirubin Urine: NEGATIVE
Hgb urine dipstick: NEGATIVE
LEUKOCYTES UA: NEGATIVE
NITRITE: NEGATIVE
PH: 6 (ref 5.0–8.0)
SPECIFIC GRAVITY, URINE: 1.015 (ref 1.000–1.030)
Total Protein, Urine: 30 — AB
URINE GLUCOSE: NEGATIVE
Urobilinogen, UA: 1 (ref 0.0–1.0)

## 2017-09-01 LAB — CBC WITH DIFFERENTIAL/PLATELET
Basophils Absolute: 0.1 10*3/uL (ref 0.0–0.1)
Basophils Relative: 1.2 % (ref 0.0–3.0)
Eosinophils Absolute: 0.1 10*3/uL (ref 0.0–0.7)
Eosinophils Relative: 1.6 % (ref 0.0–5.0)
HCT: 42 % (ref 39.0–52.0)
Hemoglobin: 14.2 g/dL (ref 13.0–17.0)
LYMPHS ABS: 1.2 10*3/uL (ref 0.7–4.0)
Lymphocytes Relative: 20.7 % (ref 12.0–46.0)
MCHC: 33.9 g/dL (ref 30.0–36.0)
MCV: 95.3 fl (ref 78.0–100.0)
MONO ABS: 0.6 10*3/uL (ref 0.1–1.0)
Monocytes Relative: 9.8 % (ref 3.0–12.0)
NEUTROS ABS: 4 10*3/uL (ref 1.4–7.7)
NEUTROS PCT: 66.7 % (ref 43.0–77.0)
PLATELETS: 214 10*3/uL (ref 150.0–400.0)
RBC: 4.4 Mil/uL (ref 4.22–5.81)
RDW: 15 % (ref 11.5–15.5)
WBC: 6 10*3/uL (ref 4.0–10.5)

## 2017-09-01 LAB — LIPID PANEL
CHOL/HDL RATIO: 4
Cholesterol: 118 mg/dL (ref 0–200)
HDL: 31.4 mg/dL — AB (ref >39.00)
LDL Cholesterol: 64 mg/dL (ref 0–99)
NONHDL: 86.64
Triglycerides: 114 mg/dL (ref 0.0–149.0)
VLDL: 22.8 mg/dL (ref 0.0–40.0)

## 2017-09-01 LAB — HEMOGLOBIN A1C: Hgb A1c MFr Bld: 8.4 % — ABNORMAL HIGH (ref 4.6–6.5)

## 2017-09-01 LAB — COMPREHENSIVE METABOLIC PANEL
ALK PHOS: 82 U/L (ref 39–117)
ALT: 16 U/L (ref 0–53)
AST: 14 U/L (ref 0–37)
Albumin: 3.8 g/dL (ref 3.5–5.2)
BUN: 15 mg/dL (ref 6–23)
CHLORIDE: 98 meq/L (ref 96–112)
CO2: 36 meq/L — AB (ref 19–32)
Calcium: 8.6 mg/dL (ref 8.4–10.5)
Creatinine, Ser: 1.39 mg/dL (ref 0.40–1.50)
GFR: 68.59 mL/min (ref >60.00)
GLUCOSE: 181 mg/dL — AB (ref 70–99)
Potassium: 3.4 mEq/L — ABNORMAL LOW (ref 3.5–5.1)
SODIUM: 142 meq/L (ref 135–145)
TOTAL PROTEIN: 6.8 g/dL (ref 6.0–8.3)
Total Bilirubin: 1.3 mg/dL — ABNORMAL HIGH (ref 0.2–1.2)

## 2017-09-01 LAB — URIC ACID: Uric Acid, Serum: 12.3 mg/dL — ABNORMAL HIGH (ref 4.0–7.8)

## 2017-09-01 MED ORDER — RIVAROXABAN 20 MG PO TABS: 20.0000 mg | ORAL_TABLET | Freq: Every day | ORAL | 1 refills | Status: DC

## 2017-09-01 NOTE — Progress Notes (Signed)
Subjective:  Patient ID: Jay Chen, male    DOB: 05/15/1966  Age: 52 y.o. MRN: [id number]  CC: Hypertension; Hyperlipidemia; and Diabetes   HPI BAYNE FOSNAUGH presents for f/up - he complains of a one year history of worsening DOE.  He tells me he is not compliant with most of his medications.  Some of them were too expensive, some of them he just decided to stop taking, and some of them he thought he did not need anymore.  He denies any recent episodes of CP, palpitations, dizziness, or lightheadedness.  He does have chronic, mild lower extremity edema.  He does not monitor his blood sugar but based on his symptoms he thinks his blood sugars are well controlled.  He has had no recent changes in his weight or appetite and he denies polys.  He has sleep apnea but tells me its being treated.  He has a history of hypercholesterolemia and hypertriglyceridemia.  He tells me he is taking the statin but is not taking the fabric acid derivative anymore.  He does not know why he stopped taking it.  Outpatient Medications Prior to Visit  Medication Sig Dispense Refill  . atorvastatin (LIPITOR) 40 MG tablet Take 1 tablet (40 mg total) by mouth daily. 90 tablet 3  . cloNIDine (CATAPRES) 0.1 MG tablet TAKE ONE TABLET BY MOUTH EVERY MORNING AND TWO TABLETS EVERY NIGHT 270 tablet 0  . glucose blood (BAYER CONTOUR NEXT TEST) test strip Use BID 100 each 12  . Lancets Misc. (UNISTIK 1) MISC Test up to twice daily 50 each 11  . magnesium oxide (MAG-OX) 400 MG tablet Take 1 tablet (400 mg total) by mouth daily. 90 tablet 1  . Olmesartan-amLODIPine-HCTZ 40-10-25 MG TABS Take 1 tablet by mouth daily. 30 tablet 0  . aspirin EC 81 MG tablet Take 1 tablet (81 mg total) by mouth daily. 90 tablet 3  . metFORMIN (GLUCOPHAGE) 1000 MG tablet Take 1 tablet (1,000 mg total) by mouth 2 (two) times daily with a meal. 180 tablet 1  . potassium chloride SA (KLOR-CON M20) 20 MEQ tablet Take 2 tablets (40 mEq total) by  mouth daily. 180 tablet 1  . albuterol (PROVENTIL HFA;VENTOLIN HFA) 108 (90 BASE) MCG/ACT inhaler Inhale 2 puffs into the lungs every 6 (six) hours as needed for wheezing or shortness of breath.    . omega-3 acid ethyl esters (LOVAZA) 1 G capsule Take 2 capsules (2 g total) by mouth 2 (two) times daily. (Patient not taking: Reported on 09/01/2017) 120 capsule 11  . dapagliflozin propanediol (FARXIGA) 5 MG TABS tablet Take 5 mg by mouth daily. (Patient not taking: Reported on 09/01/2017) 90 tablet 1  . febuxostat (ULORIC) 40 MG tablet Take 1 tablet (40 mg total) by mouth daily. (Patient not taking: Reported on 09/01/2017) 90 tablet 1  . fenofibrate 54 MG tablet Take 1 tablet (54 mg total) by mouth daily. (Patient not taking: Reported on 09/01/2017) 90 tablet 1   No facility-administered medications prior to visit.     ROS Review of Systems  Constitutional: Negative for activity change, appetite change, diaphoresis, fatigue and unexpected weight change.  HENT: Negative.   Eyes: Negative.  Negative for visual disturbance.  Respiratory: Positive for apnea and shortness of breath (DOE). Negative for cough, chest tightness and wheezing.   Cardiovascular: Positive for leg swelling.  Gastrointestinal: Negative for abdominal pain, diarrhea, nausea and vomiting.  Endocrine: Negative.   Genitourinary: Negative.  Negative for decreased urine volume,  difficulty urinating, dysuria, hematuria and urgency.  Musculoskeletal: Negative.  Negative for arthralgias and myalgias.  Skin: Negative.   Allergic/Immunologic: Negative.   Neurological: Negative.  Negative for dizziness, weakness and light-headedness.  Hematological: Negative for adenopathy. Does not bruise/bleed easily.  Psychiatric/Behavioral: Negative.     Objective:  BP (!) 142/92   Pulse 90   Temp 98.5 F (36.9 C) (Oral)   Resp 16   Ht 5\' 8"  (1.727 m)   Wt (!) 382 lb (173.3 kg)   SpO2 97%   BMI 58.08 kg/m   BP Readings from Last 3  Encounters:  09/01/17 (!) 142/92  10/12/16 138/88  12/08/15 (!) 144/86    Wt Readings from Last 3 Encounters:  09/01/17 (!) 382 lb (173.3 kg)  10/12/16 (!) 384 lb 8 oz (174.4 kg)  12/08/15 (!) 381 lb (172.8 kg)    Physical Exam  Constitutional: He is oriented to person, place, and time. He appears well-developed and well-nourished. No distress.  HENT:  Head: Normocephalic and atraumatic.  Mouth/Throat: Oropharynx is clear and moist. No oropharyngeal exudate.  Eyes: Pupils are equal, round, and reactive to light. EOM are normal. No scleral icterus.  Neck: Normal range of motion. Neck supple. No JVD present. No thyromegaly present.  Cardiovascular: An irregularly irregular rhythm present. Tachycardia present. Exam reveals no gallop.  No murmur heard. EKG---  Atrial fibrillation  - occasional ectopic ventricular beat    -  Nonspecific T-abnormality.   ABNORMAL - this is a new finding for him  Pulmonary/Chest: Effort normal and breath sounds normal. No stridor. No respiratory distress. He has no wheezes. He has no rales. He exhibits no tenderness.  Abdominal: Soft. Bowel sounds are normal. He exhibits no distension and no mass. There is no tenderness. No hernia.  Musculoskeletal: Normal range of motion. He exhibits edema (trace LE pitting edema). He exhibits no tenderness or deformity.  Lymphadenopathy:    He has no cervical adenopathy.  Neurological: He is alert and oriented to person, place, and time.  Skin: Skin is warm and dry. He is not diaphoretic.  Psychiatric: He has a normal mood and affect. His behavior is normal. Judgment and thought content normal.  Vitals reviewed.   Lab Results  Component Value Date   WBC 6.0 09/01/2017   HGB 14.2 09/01/2017   HCT 42.0 09/01/2017   PLT 214.0 09/01/2017   GLUCOSE 181 (H) 09/01/2017   CHOL 118 09/01/2017   TRIG 114.0 09/01/2017   HDL 31.40 (L) 09/01/2017   LDLDIRECT 104.0 10/12/2016   LDLCALC 64 09/01/2017   ALT 16 09/01/2017    AST 14 09/01/2017   NA 142 09/01/2017   K 3.4 (L) 09/01/2017   CL 98 09/01/2017   CREATININE 1.39 09/01/2017   BUN 15 09/01/2017   CO2 36 (H) 09/01/2017   TSH 3.13 09/01/2017   PSA 1.80 10/12/2016   INR 1.01 09/19/2009   HGBA1C 8.4 (H) 09/01/2017   MICROALBUR 31.2 (H) 09/01/2017    Ct Head Wo Contrast  Result Date: 02/09/2015 CLINICAL DATA:  52 year old male with syncope and fall. EXAM: CT HEAD WITHOUT CONTRAST TECHNIQUE: Contiguous axial images were obtained from the base of the skull through the vertex without intravenous contrast. COMPARISON:  Head CT dated 09/27/2009 FINDINGS: The ventricles and the sulci are appropriate in size for the patient's age. There is no intracranial hemorrhage. No midline shift or mass effect identified. The gray-white matter differentiation is preserved. The visualized paranasal sinuses and mastoid air cells are well aerated.  Right maxillary sinus retention cyst or polyp. The calvarium is intact. IMPRESSION: No acute intracranial pathology. Electronically Signed   By: Anner Crete M.D.   On: 02/09/2015 01:00    Assessment & Plan:   Finbar was seen today for hypertension, hyperlipidemia and diabetes.  Diagnoses and all orders for this visit:  Essential hypertension- His blood pressure is adequately well controlled.  He has persistent hypokalemia so I have asked him to start taking the potassium supplement.  He will continue the combination of an ARB, calcium channel blocker, and thiazide diuretic. -     Comprehensive metabolic panel; Future -     CBC with Differential/Platelet; Future -     Urinalysis, Routine w reflex microscopic; Future -     potassium chloride SA (KLOR-CON M20) 20 MEQ tablet; Take 2 tablets (40 mEq total) by mouth daily.  Gout of big toe- He has not achieved his uric acid goal.  He is not taking Uloric because it was too expensive.  I have asked him to start taking allopurinol. -     Uric acid; Future -     allopurinol  (ZYLOPRIM) 100 MG tablet; Take 1 tablet (100 mg total) by mouth daily.  Controlled type 2 diabetes mellitus with complication, with long-term current use of insulin ([place])- His A1c is up to 8.4%.  His blood sugars are not adequately well controlled.  I have asked him to restart metformin and the SGL T-2 inhibitor.  We will also add a GLP-1 agonist. -     Comprehensive metabolic panel; Future -     Hemoglobin A1c; Future -     Microalbumin / creatinine urine ratio; Future -     C-peptide; Future -     Ambulatory referral to Ophthalmology -     metFORMIN (GLUCOPHAGE) 1000 MG tablet; Take 1 tablet (1,000 mg total) by mouth 2 (two) times daily with a meal. -     dapagliflozin propanediol (FARXIGA) 5 MG TABS tablet; Take 5 mg by mouth daily. -     Exenatide ER (BYDUREON BCISE) 2 MG/0.85ML AUIJ; Inject 1 Act into the skin once a week.  Hypokalemia -     Comprehensive metabolic panel; Future -     potassium chloride SA (KLOR-CON M20) 20 MEQ tablet; Take 2 tablets (40 mEq total) by mouth daily.  Hyperlipidemia with target LDL less than 100- He has achieved his LDL goal and is doing well on the statin. -     Lipid panel; Future  Pure hyperglyceridemia- Improvement noted. -     Lipid panel; Future  Irregular heart rate -     EKG 12-Lead  Persistent atrial fibrillation ([place])- He has new onset atrial fibrillation.  The tracing today showed some excursions of rapid ventricular response.  He is not symptomatic with respect to this.  Will start anticoagulation with Xarelto.  I have asked him to start taking metoprolol for rate control and to see cardiology to see if he is a candidate for cardioversion. -     Thyroid Panel With TSH; Future -     Ambulatory referral to Cardiology -     rivaroxaban (XARELTO) 20 MG TABS tablet; Take 1 tablet (20 mg total) by mouth daily with supper. -     metoprolol succinate (TOPROL-XL) 25 MG 24 hr tablet; Take 1 tablet (25 mg total) by mouth daily.  Colon cancer  screening -     Cologuard  Need for pneumococcal vaccination -     Pneumococcal  polysaccharide vaccine 23-valent greater than or equal to 2yo subcutaneous/IM  Controlled type 2 diabetes mellitus with complication, without long-term current use of insulin ([place])   I have discontinued Sabian E. Stansbery's fenofibrate, aspirin EC, and febuxostat. I am also having him start on rivaroxaban, allopurinol, metoprolol succinate, and Exenatide ER. Additionally, I am having him maintain his glucose blood, UNISTIK 1, omega-3 acid ethyl esters, albuterol, atorvastatin, magnesium oxide, cloNIDine, Olmesartan-amLODIPine-HCTZ, metFORMIN, potassium chloride SA, and dapagliflozin propanediol.  Meds ordered this encounter  Medications  . rivaroxaban (XARELTO) 20 MG TABS tablet    Sig: Take 1 tablet (20 mg total) by mouth daily with supper.    Dispense:  90 tablet    Refill:  1  . allopurinol (ZYLOPRIM) 100 MG tablet    Sig: Take 1 tablet (100 mg total) by mouth daily.    Dispense:  90 tablet    Refill:  0  . metFORMIN (GLUCOPHAGE) 1000 MG tablet    Sig: Take 1 tablet (1,000 mg total) by mouth 2 (two) times daily with a meal.    Dispense:  180 tablet    Refill:  1  . potassium chloride SA (KLOR-CON M20) 20 MEQ tablet    Sig: Take 2 tablets (40 mEq total) by mouth daily.    Dispense:  180 tablet    Refill:  1  . metoprolol succinate (TOPROL-XL) 25 MG 24 hr tablet    Sig: Take 1 tablet (25 mg total) by mouth daily.    Dispense:  90 tablet    Refill:  1  . dapagliflozin propanediol (FARXIGA) 5 MG TABS tablet    Sig: Take 5 mg by mouth daily.    Dispense:  90 tablet    Refill:  1  . Exenatide ER (BYDUREON BCISE) 2 MG/0.85ML AUIJ    Sig: Inject 1 Act into the skin once a week.    Dispense:  12 pen    Refill:  1     Follow-up: Return in about 3 months (around 12/01/2017).  Scarlette Calico, MD

## 2017-09-01 NOTE — Patient Instructions (Signed)
Atrial Fibrillation Atrial fibrillation is a type of irregular or rapid heartbeat (arrhythmia). In atrial fibrillation, the heart quivers continuously in a chaotic pattern. This occurs when parts of the heart receive disorganized signals that make the heart unable to pump blood normally. This can increase the risk for stroke, heart failure, and other heart-related conditions. There are different types of atrial fibrillation, including:  Paroxysmal atrial fibrillation. This type starts suddenly, and it usually stops on its own shortly after it starts.  Persistent atrial fibrillation. This type often lasts longer than a week. It may stop on its own or with treatment.  Long-lasting persistent atrial fibrillation. This type lasts longer than 12 months.  Permanent atrial fibrillation. This type does not go away.  Talk with your health care provider to learn about the type of atrial fibrillation that you have. What are the causes? This condition is caused by some heart-related conditions or procedures, including:  A heart attack.  Coronary artery disease.  Heart failure.  Heart valve conditions.  High blood pressure.  Inflammation of the sac that surrounds the heart (pericarditis).  Heart surgery.  Certain heart rhythm disorders, such as Wolf-Parkinson-White syndrome.  Other causes include:  Pneumonia.  Obstructive sleep apnea.  Blockage of an artery in the lungs (pulmonary embolism, or PE).  Lung cancer.  Chronic lung disease.  Thyroid problems, especially if the thyroid is overactive (hyperthyroidism).  Caffeine.  Excessive alcohol use or illegal drug use.  Use of some medicines, including certain decongestants and diet pills.  Sometimes, the cause cannot be found. What increases the risk? This condition is more likely to develop in:  People who are older in age.  People who smoke.  People who have diabetes mellitus.  People who are overweight  (obese).  Athletes who exercise vigorously.  What are the signs or symptoms? Symptoms of this condition include:  A feeling that your heart is beating rapidly or irregularly.  A feeling of discomfort or pain in your chest.  Shortness of breath.  Sudden light-headedness or weakness.  Getting tired easily during exercise.  In some cases, there are no symptoms. How is this diagnosed? Your health care provider may be able to detect atrial fibrillation when taking your pulse. If detected, this condition may be diagnosed with:  An electrocardiogram (ECG).  A Holter monitor test that records your heartbeat patterns over a 24-hour period.  Transthoracic echocardiogram (TTE) to evaluate how blood flows through your heart.  Transesophageal echocardiogram (TEE) to view more detailed images of your heart.  A stress test.  Imaging tests, such as a CT scan or chest X-ray.  Blood tests.  How is this treated? The main goals of treatment are to prevent blood clots from forming and to keep your heart beating at a normal rate and rhythm. The type of treatment that you receive depends on many factors, such as your underlying medical conditions and how you feel when you are experiencing atrial fibrillation. This condition may be treated with:  Medicine to slow down the heart rate, bring the heart's rhythm back to normal, or prevent clots from forming.  Electrical cardioversion. This is a procedure that resets your heart's rhythm by delivering a controlled, low-energy shock to the heart through your skin.  Different types of ablation, such as catheter ablation, catheter ablation with pacemaker, or surgical ablation. These procedures destroy the heart tissues that send abnormal signals. When the pacemaker is used, it is placed under your skin to help your heart beat in   a regular rhythm.  Follow these instructions at home:  Take over-the counter and prescription medicines only as told by your  health care provider.  If your health care provider prescribed a blood-thinning medicine (anticoagulant), take it exactly as told. Taking too much blood-thinning medicine can cause bleeding. If you do not take enough blood-thinning medicine, you will not have the protection that you need against stroke and other problems.  Do not use tobacco products, including cigarettes, chewing tobacco, and e-cigarettes. If you need help quitting, ask your health care provider.  If you have obstructive sleep apnea, manage your condition as told by your health care provider.  Do not drink alcohol.  Do not drink beverages that contain caffeine, such as coffee, soda, and tea.  Maintain a healthy weight. Do not use diet pills unless your health care provider approves. Diet pills may make heart problems worse.  Follow diet instructions as told by your health care provider.  Exercise regularly as told by your health care provider.  Keep all follow-up visits as told by your health care provider. This is important. How is this prevented?  Avoid drinking beverages that contain caffeine or alcohol.  Avoid certain medicines, especially medicines that are used for breathing problems.  Avoid certain herbs and herbal medicines, such as those that contain ephedra or ginseng.  Do not use illegal drugs, such as cocaine and amphetamines.  Do not smoke.  Manage your high blood pressure. Contact a health care provider if:  You notice a change in the rate, rhythm, or strength of your heartbeat.  You are taking an anticoagulant and you notice increased bruising.  You tire more easily when you exercise or exert yourself. Get help right away if:  You have chest pain, abdominal pain, sweating, or weakness.  You feel nauseous.  You notice blood in your vomit, bowel movement, or urine.  You have shortness of breath.  You suddenly have swollen feet and ankles.  You feel dizzy.  You have sudden weakness or  numbness of the face, arm, or leg, especially on one side of the body.  You have trouble speaking, trouble understanding, or both (aphasia).  Your face or your eyelid droops on one side. These symptoms may represent a serious problem that is an emergency. Do not wait to see if the symptoms will go away. Get medical help right away. Call your local emergency services (911 in the [place] Do not drive yourself to the hospital. This information is not intended to replace advice given to you by your health care provider. Make sure you discuss any questions you have with your health care provider. Document Released: 05/10/2005 Document Revised: 09/17/2015 Document Reviewed: 09/04/2014 Elsevier Interactive Patient Education  2018 [employer]  

## 2017-09-02 ENCOUNTER — Encounter: Payer: Self-pay | Admitting: Internal Medicine

## 2017-09-02 LAB — THYROID PANEL WITH TSH
Free Thyroxine Index: 3.1 (ref 1.4–3.8)
T3 Uptake: 31 % (ref 22–35)
T4 TOTAL: 10.1 ug/dL (ref 4.9–10.5)
TSH: 3.13 mIU/L (ref 0.40–4.50)

## 2017-09-02 LAB — C-PEPTIDE: C-Peptide: 5.79 ng/mL — ABNORMAL HIGH (ref 0.80–3.85)

## 2017-09-02 MED ORDER — ALLOPURINOL 100 MG PO TABS: 100.0000 mg | ORAL_TABLET | Freq: Every day | ORAL | 0 refills | Status: DC

## 2017-09-02 MED ORDER — DAPAGLIFLOZIN PROPANEDIOL 5 MG PO TABS: 5.0000 mg | ORAL_TABLET | Freq: Every day | ORAL | 1 refills | Status: DC

## 2017-09-02 MED ORDER — METOPROLOL SUCCINATE ER 25 MG PO TB24: 25.0000 mg | ORAL_TABLET | Freq: Every day | ORAL | 1 refills | Status: DC

## 2017-09-02 MED ORDER — POTASSIUM CHLORIDE CRYS ER 20 MEQ PO TBCR: 40.0000 meq | EXTENDED_RELEASE_TABLET | Freq: Every day | ORAL | 1 refills | Status: DC

## 2017-09-02 MED ORDER — EXENATIDE ER 2 MG/0.85ML ~~LOC~~ AUIJ: 1.0000 | AUTO-INJECTOR | SUBCUTANEOUS | 1 refills | Status: DC

## 2017-09-02 MED ORDER — METFORMIN HCL 1000 MG PO TABS: 1000.0000 mg | ORAL_TABLET | Freq: Two times a day (BID) | ORAL | 1 refills | Status: DC

## 2017-09-13 ENCOUNTER — Telehealth: Payer: Self-pay

## 2017-09-13 NOTE — Telephone Encounter (Signed)
Form completed and given to PCP to sign.

## 2017-09-13 NOTE — Telephone Encounter (Signed)
Left patient a message to call back.  I have a patient assistance form for the [employer] if he is interested in getting it directly from the pharmaceutical company.

## 2017-09-13 NOTE — Telephone Encounter (Signed)
Patient said he is interested the form. Please call patient when he needs to pick this up

## 2017-09-15 NOTE — Telephone Encounter (Signed)
This has been mailed to patient. Will wait for the form to be return once the patient has completed his portions.

## 2017-09-30 ENCOUNTER — Ambulatory Visit: Payer: BC Managed Care – PPO | Admitting: Cardiovascular Disease

## 2017-10-03 ENCOUNTER — Other Ambulatory Visit: Payer: Self-pay | Admitting: Internal Medicine

## 2017-10-03 DIAGNOSIS — I10 Essential (primary) hypertension: Secondary | ICD-10-CM

## 2017-10-14 ENCOUNTER — Other Ambulatory Visit: Payer: Self-pay | Admitting: Internal Medicine

## 2017-10-14 ENCOUNTER — Encounter: Payer: Self-pay | Admitting: Internal Medicine

## 2017-10-14 DIAGNOSIS — E118 Type 2 diabetes mellitus with unspecified complications: Secondary | ICD-10-CM

## 2017-10-14 DIAGNOSIS — E785 Hyperlipidemia, unspecified: Secondary | ICD-10-CM

## 2017-10-14 MED ORDER — ATORVASTATIN CALCIUM 40 MG PO TABS: 40.0000 mg | ORAL_TABLET | Freq: Every day | ORAL | 3 refills | Status: DC

## 2017-10-14 MED ORDER — CLONIDINE HCL 0.1 MG PO TABS: ORAL_TABLET | ORAL | 0 refills | Status: DC

## 2017-10-21 ENCOUNTER — Encounter: Payer: Self-pay | Admitting: Cardiovascular Disease

## 2017-10-21 ENCOUNTER — Ambulatory Visit: Payer: BC Managed Care – PPO | Admitting: Cardiovascular Disease

## 2017-10-21 VITALS — BP 133/94 | HR 60 | Ht 68.0 in | Wt >= 6400 oz

## 2017-10-21 DIAGNOSIS — R600 Localized edema: Secondary | ICD-10-CM | POA: Diagnosis not present

## 2017-10-21 DIAGNOSIS — I10 Essential (primary) hypertension: Secondary | ICD-10-CM | POA: Diagnosis not present

## 2017-10-21 DIAGNOSIS — I481 Persistent atrial fibrillation: Secondary | ICD-10-CM | POA: Diagnosis not present

## 2017-10-21 DIAGNOSIS — I2781 Cor pulmonale (chronic): Secondary | ICD-10-CM | POA: Diagnosis not present

## 2017-10-21 DIAGNOSIS — G4733 Obstructive sleep apnea (adult) (pediatric): Secondary | ICD-10-CM | POA: Diagnosis not present

## 2017-10-21 DIAGNOSIS — I4819 Other persistent atrial fibrillation: Secondary | ICD-10-CM

## 2017-10-21 MED ORDER — FUROSEMIDE 40 MG PO TABS: 40.0000 mg | ORAL_TABLET | Freq: Every day | ORAL | 1 refills | Status: DC

## 2017-10-21 NOTE — Assessment & Plan Note (Signed)
Apparently followed by [name]

## 2017-10-21 NOTE — Assessment & Plan Note (Signed)
History of obstructive sleep apnea currently not wearing CPAP which he cannot tolerate

## 2017-10-21 NOTE — Assessment & Plan Note (Signed)
History of atrial fibrillation now rate controlled on oral Xarelto oral anticoagulation done by his PCP.

## 2017-10-21 NOTE — Assessment & Plan Note (Signed)
This is gotten worse over the last month or 2.  He is on hydrochlorothiazide.  We talked about the importance of salt restriction.  I am going to begin him on 40 mg of p.o. furosemide and will check a basic metabolic panel in 10 days.  He will see a mid-level provider back in 2 weeks.

## 2017-10-21 NOTE — Addendum Note (Signed)
Addended by: Therisa Doyne on: 10/21/2017 03:01 PM   Modules accepted: Orders

## 2017-10-21 NOTE — Progress Notes (Signed)
10/21/2017 Jay Chen   Jan 23, 1966  [id number]  Primary Physician [name], MD Primary Cardiologist: [name] MD [name], [place]  HPI:  Jay Chen is a 52 y.o. 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  He was previously a patient of [name] who saw him back in 2013.  He has a history of essential hypertension, diabetes, hypertriglyceridemia and cor pulmonale as well as a thoracic aortic aneurysm followed by [name] in the past.  He was referred by his PCP, Dr. [name], for atrial fibrillation.  This is currently rate controlled on beta-blocker which was recently added.  He was also placed on Xarelto.  His shortness of breath is chronic.  He does say that he is gained weight over the last month and has had increasing lower extremity edema.  He has obstructive sleep apnea but does not wear CPAP.   Current Meds  Medication Sig  . albuterol (PROVENTIL HFA;VENTOLIN HFA) 108 (90 BASE) MCG/ACT inhaler Inhale 2 puffs into the lungs every 6 (six) hours as needed for wheezing or shortness of breath.  . allopurinol (ZYLOPRIM) 100 MG tablet Take 1 tablet (100 mg total) by mouth daily.  [name] atorvastatin (LIPITOR) 40 MG tablet Take 1 tablet (40 mg total) by mouth daily.  . cloNIDine (CATAPRES) 0.1 MG tablet Take 1 tab in the am and 2 tab every pm.  . dapagliflozin propanediol (FARXIGA) 5 MG TABS tablet Take 5 mg by mouth daily.  . Exenatide ER (BYDUREON BCISE) 2 MG/0.85ML AUIJ Inject 1 Act into the skin once a week.  [name] glucose blood (BAYER CONTOUR NEXT TEST) test strip Use BID  . Lancets Misc. (UNISTIK 1) MISC Test up to twice daily  . magnesium oxide (MAG-OX) 400 MG tablet Take 1 tablet (400 mg total) by mouth daily.  . metFORMIN (GLUCOPHAGE) 1000 MG tablet Take 1 tablet (1,000 mg total) by mouth 2 (two) times daily with a meal.  . metoprolol succinate (TOPROL-XL) 25 MG 24 hr tablet  Take 1 tablet (25 mg total) by mouth daily.  . Olmesartan-amLODIPine-HCTZ 40-10-25 MG TABS Take 1 tablet by mouth daily.  [name] omega-3 acid ethyl esters (LOVAZA) 1 G capsule Take 2 capsules (2 g total) by mouth 2 (two) times daily.  . potassium chloride SA (KLOR-CON M20) 20 MEQ tablet Take 2 tablets (40 mEq total) by mouth daily.  . rivaroxaban (XARELTO) 20 MG TABS tablet Take 1 tablet (20 mg total) by mouth daily with supper.     No Known Allergies  Social History   Socioeconomic History  . Marital status: Married    Spouse name: Not on file  . Number of children: Not on file  . Years of education: Not on file  . Highest education level: Not on file  Occupational History  . Not on file  Social Needs  . Financial resource strain: Not on file  . Food insecurity:    Worry: Not on file    Inability: Not on file  . Transportation needs:    Medical: Not on file    Non-medical: Not on file  Tobacco Use  . Smoking status: Never Smoker  . Smokeless tobacco: Never Used  Substance and Sexual Activity  . Alcohol use: Yes    Alcohol/week: 3.0 oz    Types: 5 Cans of beer per week    Comment: Rarely  . Drug  use: No  . Sexual activity: Not Currently  Lifestyle  . Physical activity:    Days per week: Not on file    Minutes per session: Not on file  . Stress: Not on file  Relationships  . Social connections:    Talks on phone: Not on file    Gets together: Not on file    Attends religious service: Not on file    Active member of club or organization: Not on file    Attends meetings of clubs or organizations: Not on file    Relationship status: Not on file  . Intimate partner violence:    Fear of current or ex partner: Not on file    Emotionally abused: Not on file    Physically abused: Not on file    Forced sexual activity: Not on file  Other Topics Concern  . Not on file  Social History Narrative  . Not on file     Review of Systems: General: negative for chills, fever,  night sweats or weight changes.  Cardiovascular: negative for chest pain, dyspnea on exertion, edema, orthopnea, palpitations, paroxysmal nocturnal dyspnea or shortness of breath Dermatological: negative for rash Respiratory: negative for cough or wheezing Urologic: negative for hematuria Abdominal: negative for nausea, vomiting, diarrhea, bright red blood per rectum, melena, or hematemesis Neurologic: negative for visual changes, syncope, or dizziness All other systems reviewed and are otherwise negative except as noted above.    Blood pressure (!) 133/94, pulse 60, height 5\' 8"  (1.727 m), weight (!) 412 lb (186.9 kg).  General appearance: alert and no distress Neck: no adenopathy, no carotid bruit, no JVD, supple, symmetrical, trachea midline and thyroid not enlarged, symmetric, no tenderness/mass/nodules Lungs: clear to auscultation bilaterally Heart: irregularly irregular rhythm Extremities: 3+ pitting edema bilaterally Pulses: 2+ and symmetric Skin: Skin color, texture, turgor normal. No rashes or lesions Neurologic: Alert and oriented X 3, normal strength and tone. Normal symmetric reflexes. Normal coordination and gait  EKG not performed today  ASSESSMENT AND PLAN:   Obstructive sleep apnea History of obstructive sleep apnea currently not wearing CPAP which he cannot tolerate  Cor pulmonale (HCC) History of cor pulmonale past probably related to obesity hypoventilation syndrome  HTN (hypertension) History of essential hypertension with blood pressure measured at 133/94.  This is fairly typical for his blood pressure that he measures at home on occasional basis.  He is on metoprolol, olmesartan/amlodipine/hydrochlorothiazide.  Aneurysm of thoracic aorta (HCC) Apparently followed by [name]  Persistent atrial fibrillation [facility]) History of atrial fibrillation now rate controlled on oral Xarelto oral anticoagulation done by his PCP.  Bilateral lower extremity  edema This is gotten worse over the last month or 2.  He is on hydrochlorothiazide.  We talked about the importance of salt restriction.  I am going to begin him on 40 mg of p.o. furosemide and will check a basic metabolic panel in 10 days.  He will see a mid-level provider back in 2 weeks.      [name] MD FACP,FACC,FAHA, [facility] 10/21/2017 2:47 PM

## 2017-10-21 NOTE — Assessment & Plan Note (Signed)
History of cor pulmonale past probably related to obesity hypoventilation syndrome

## 2017-10-21 NOTE — Assessment & Plan Note (Signed)
History of essential hypertension with blood pressure measured at 133/94.  This is fairly typical for his blood pressure that he measures at home on occasional basis.  He is on metoprolol, olmesartan/amlodipine/hydrochlorothiazide.

## 2017-10-21 NOTE — Patient Instructions (Signed)
Medication Instructions: Your physician recommends that you continue on your current medications as directed. Please refer to the Current Medication list given to you today.  START Furosemide 40 mg daily.   Labwork: Your physician recommends that you return for lab work in: 2 weeks  Follow-Up: Your physician recommends that you schedule a follow-up appointment in: 2 weeks with a PA and 1 month with Dr. Gwenlyn Found.

## 2017-10-31 ENCOUNTER — Other Ambulatory Visit: Payer: Self-pay

## 2017-10-31 ENCOUNTER — Ambulatory Visit (HOSPITAL_COMMUNITY): Payer: BC Managed Care – PPO | Attending: Cardiovascular Disease

## 2017-10-31 DIAGNOSIS — I4819 Other persistent atrial fibrillation: Secondary | ICD-10-CM

## 2017-10-31 DIAGNOSIS — I10 Essential (primary) hypertension: Secondary | ICD-10-CM

## 2017-10-31 DIAGNOSIS — I77810 Thoracic aortic ectasia: Secondary | ICD-10-CM | POA: Insufficient documentation

## 2017-10-31 DIAGNOSIS — Z6841 Body Mass Index (BMI) 40.0 and over, adult: Secondary | ICD-10-CM | POA: Insufficient documentation

## 2017-10-31 DIAGNOSIS — I2781 Cor pulmonale (chronic): Secondary | ICD-10-CM

## 2017-10-31 DIAGNOSIS — I481 Persistent atrial fibrillation: Secondary | ICD-10-CM | POA: Insufficient documentation

## 2017-10-31 DIAGNOSIS — I119 Hypertensive heart disease without heart failure: Secondary | ICD-10-CM | POA: Insufficient documentation

## 2017-10-31 DIAGNOSIS — G4733 Obstructive sleep apnea (adult) (pediatric): Secondary | ICD-10-CM | POA: Diagnosis not present

## 2017-10-31 MED ORDER — PERFLUTREN LIPID MICROSPHERE
1.0000 mL | INTRAVENOUS | Status: AC | PRN
  Administered 2017-10-31: 3 mL via INTRAVENOUS

## 2017-11-06 NOTE — Progress Notes (Signed)
Cardiology Office Note   Date:  11/07/2017   ID:  [name] Jay Chen, DOB Sep 26, 1965, MRN [id number]  PCP:  [name], MD  Cardiologist:  Dr. Gwenlyn Found and Dr. Stanford Breed Chief Complaint  Patient presents with  . Follow-up     History of Present Illness: Jay Chen is a 52 y.o. male who presents for ongoing assessment and management of thoracic aortic aneurysm followed by [name], atrial fibrillation, hypertension, hypertriglyceridemia, cor pulmonale, OSA noncompliant with CPAP and type 2 diabetes.    The patient was last seen in the office on 10/21/2017 by Dr. Quay Burow.  At that time the patient was rate controlled, remain on Xarelto without any issues of melena, hemoptysis, or hematuria. He was noted to have lower extremity edema which the patient reported had worsened over the last 2 months.  The patient was started on Lasix 40 mg p.o. daily, he was continued on HCTZ, and salt restriction was reinforced.  He is to follow up with a BMET and echo.[name]  BMET completed on 09/01/2017 sodium 142; potassium 3.4; chloride 98; CO2 36; glucose 181; BUN 15, creatinine 1.39. Lipid study also completed: Cholesterol 118; HDL 31.4; LDL 64; triglycerides 114 Hemoglobin 14 seen 0.2; HCT 42.0; white blood cells 6.0, platelets 214.  Echocardiogram 10/31/2017 Left ventricle: The cavity size was moderately to severely   dilated. Wall thickness was increased in a pattern of moderate   LVH. Systolic function was severely reduced. The estimated   ejection fraction was in the range of 20% to 25%. Diffuse   hypokinesis. The study is not technically sufficient to allow   evaluation of LV diastolic function. - Left atrium: The atrium was severely dilated. - Right ventricle: The cavity size was dilated. Systolic function   was moderately reduced. - Right atrium: The atrium was dilated. - Pericardium, extracardiac: A trivial pericardial effusion was   identified posterior to the heart. - Impressions:  Technically very limited study due to extremely poor   sound wave transmission. There appears to be significant   buventricular dysfunction.  Compared to past echocardiogram in 2013, he has had a significant reduction in his LV systolic function from a normal of 60% to 65% to 20% to 25%.  He has diuresed 23 pounds since last being seen with the addition of Lasix.  He continues to have some lower extremity edema and shortness of breath especially with exertion.  He denies chest pain.  He would like to be able to be compliant with CPAP but he is claustrophobic when using it.  Past Medical History:  Diagnosis Date  . Congestive heart failure [name]) May of 2011   Felt to have cor pulmonale; EF 45 to 50% from echo in May 2011  . Diabetes mellitus   . Hyperlipidemia   . Hypertension   . Morbid obesity ([place])   . Sleep apnea   . Thoracic aneurysm    Followed by [name]    Past Surgical History:  Procedure Laterality Date  . CHOLECYSTECTOMY  2000  . US ECHOCARDIOGRAPHY  09/21/2009   EF 45-50%; Cavity size was severely dilated, severe concentric hypertrophy and normal wall motion     Current Outpatient Medications  Medication Sig Dispense Refill  . albuterol (PROVENTIL HFA;VENTOLIN HFA) 108 (90 BASE) MCG/ACT inhaler Inhale 2 puffs into the lungs every 6 (six) hours as needed for wheezing or shortness of breath.    . allopurinol (ZYLOPRIM) 100 MG tablet Take 1 tablet (100 mg total) by mouth  daily. 90 tablet 0  . atorvastatin (LIPITOR) 40 MG tablet Take 1 tablet (40 mg total) by mouth daily. 90 tablet 3  . cloNIDine (CATAPRES) 0.1 MG tablet Take 1 tab in the am and 2 tab every pm. 270 tablet 0  . dapagliflozin propanediol (FARXIGA) 5 MG TABS tablet Take 5 mg by mouth daily. 90 tablet 1  . Exenatide ER (BYDUREON BCISE) 2 MG/0.85ML AUIJ Inject 1 Act into the skin once a week. 12 pen 1  . furosemide (LASIX) 40 MG tablet Take 1.5 tablets (60 mg total) by mouth daily. 135 tablet 1  .  glucose blood (BAYER CONTOUR NEXT TEST) test strip Use BID 100 each 12  . Lancets Misc. (UNISTIK 1) MISC Test up to twice daily 50 each 11  . magnesium oxide (MAG-OX) 400 MG tablet Take 1 tablet (400 mg total) by mouth daily. 90 tablet 1  . metFORMIN (GLUCOPHAGE) 1000 MG tablet Take 1 tablet (1,000 mg total) by mouth 2 (two) times daily with a meal. 180 tablet 1  . metoprolol succinate (TOPROL-XL) 25 MG 24 hr tablet Take 1 tablet (25 mg total) by mouth daily. 90 tablet 1  . omega-3 acid ethyl esters (LOVAZA) 1 G capsule Take 2 capsules (2 g total) by mouth 2 (two) times daily. 120 capsule 11  . potassium chloride SA (KLOR-CON M20) 20 MEQ tablet Take 2 tablets (40 mEq total) by mouth daily. 180 tablet 1  . rivaroxaban (XARELTO) 20 MG TABS tablet Take 1 tablet (20 mg total) by mouth daily with supper. 90 tablet 1  . sacubitril-valsartan (ENTRESTO) 24-26 MG Take 1 tablet by mouth 2 (two) times daily. 60 tablet 5   No current facility-administered medications for this visit.     Allergies:   Patient has no known allergies.    Social History:  The patient  reports that he has never smoked. He has never used smokeless tobacco. He reports that he drinks about 3.0 oz of alcohol per week. He reports that he does not use drugs.   Family History:  The patient's family history includes Colon cancer in his father; Hypertension in his mother; Pancreatic cancer in his father; Prostate cancer in his father.    ROS: All other systems are reviewed and negative. Unless otherwise mentioned in H&P    PHYSICAL EXAM: VS:  BP 124/86   Pulse 88   Ht 5\' 8"  (1.727 m)   Wt (!) 389 lb 9.6 oz (176.7 kg)   BMI 59.24 kg/m  , BMI Body mass index is 59.24 kg/m. GEN: Well nourished, well developed, in no acute distress  HEENT: normal  Neck: no JVD, carotid bruits, or masses Cardiac: IRRR; no murmurs, rubs, or gallops,no edema  Respiratory:  clear to auscultation bilaterally, normal work of breathing GI: soft,  nontender, nondistended, + BS MS: no deformity or atrophy  Skin: warm and dry, no rash Neuro:  Strength and sensation are intact Psych: euthymic mood, full affect   EKG: Not completed during this office visit  Recent Labs: 09/01/2017: ALT 16; BUN 15; Creatinine, Ser 1.39; Hemoglobin 14.2; Platelets 214.0; Potassium 3.4; Sodium 142; TSH 3.13    Lipid Panel    Component Value Date/Time   CHOL 118 09/01/2017 1357   TRIG 114.0 09/01/2017 1357   HDL 31.40 (L) 09/01/2017 1357   CHOLHDL 4 09/01/2017 1357   VLDL 22.8 09/01/2017 1357   LDLCALC 64 09/01/2017 1357   LDLDIRECT 104.0 10/12/2016 1118      Wt Readings from  Last 3 Encounters:  11/07/17 (!) 389 lb 9.6 oz (176.7 kg)  10/21/17 (!) 412 lb (186.9 kg)  09/01/17 (!) 382 lb (173.3 kg)    ASSESSMENT AND PLAN:  1.  Dilated cardiomyopathy: Echocardiogram reveals severe dilatation of his left ventricle, diffuse hypokinesis, moderate LVH with RV dilatation.  Medications will be adjusted in light of reduced EF of 20% to 25%.  I have discussed my plan with Dr. Skeet Latch, who is DOD here in the office today.  We will discontinue on losartan/amlodipine/HCTZ.  We will start him on Entresto 24/26 mg tablets twice daily.  Continue him on metoprolol succinate 25 mg daily but may increase to 37.5 mg for better heart rate control should it be elevated on next office visit.  The patient will be increased on Lasix dose from 40 mg daily to 60 mg daily as he continues to have evidence of fluid overload.  He will continue potassium tablets 40 mEq daily.  A follow-up BMET in 1 week is ordered.  I also discussed the need to have cardiac catheterization planned, when speaking with Dr. Oval Linsey.  She feels that since he is still fluid overloaded it may be best to keep him on his medication regimen a little longer before planning cardiac catheterization.  She does agree that this is necessary and he may require both right and left catheterization.  2.   Hypertension: Blood pressure is low but not optimal for reduced EF.  Medication adjustments as above.  3.  Atrial fibrillation: Heart rate is a little elevated today on office visit will likely need to titrate metoprolol succinate up to 37.5 or even 50 mg daily for better heart rate control if it remains elevated on next office visit.  He will continue Xarelto.  Timing of cardiac catheterization and holding will be determined on next office visit.    4.  Morbid obesity: He is counseled on low-sodium reduced calorie diet.   Current medicines are reviewed at length with the patient today.    Labs/ tests ordered today include: BMET Phill Myron. West Pugh, ANP, AACC   11/07/2017 12:17 PM     [facility] [address] [place], [place] [address] Phone: [phone]; Fax: [phone]

## 2017-11-07 ENCOUNTER — Encounter: Payer: Self-pay | Admitting: Adult Health

## 2017-11-07 ENCOUNTER — Ambulatory Visit: Payer: BC Managed Care – PPO | Admitting: Adult Health

## 2017-11-07 VITALS — BP 124/86 | HR 88 | Ht 68.0 in | Wt 389.6 lb

## 2017-11-07 DIAGNOSIS — I482 Chronic atrial fibrillation, unspecified: Secondary | ICD-10-CM

## 2017-11-07 DIAGNOSIS — Z79899 Other long term (current) drug therapy: Secondary | ICD-10-CM | POA: Diagnosis not present

## 2017-11-07 DIAGNOSIS — I5042 Chronic combined systolic (congestive) and diastolic (congestive) heart failure: Secondary | ICD-10-CM

## 2017-11-07 DIAGNOSIS — I43 Cardiomyopathy in diseases classified elsewhere: Secondary | ICD-10-CM | POA: Diagnosis not present

## 2017-11-07 DIAGNOSIS — I10 Essential (primary) hypertension: Secondary | ICD-10-CM

## 2017-11-07 MED ORDER — SACUBITRIL-VALSARTAN 24-26 MG PO TABS: 1.0000 | ORAL_TABLET | Freq: Two times a day (BID) | ORAL | 5 refills | Status: DC

## 2017-11-07 MED ORDER — FUROSEMIDE 40 MG PO TABS: 60.0000 mg | ORAL_TABLET | Freq: Every day | ORAL | 1 refills | Status: DC

## 2017-11-07 NOTE — Patient Instructions (Addendum)
Medication Instructions:  INCREASE LASIX 60MG  (1-1/2 TAB) DAILY  STOP Olmesartan-amLODIPine-HCTZ 40-10-25 MG TAB  START ENTRESTO 24/26MG  DAILY  If you need a refill on your cardiac medications before your next appointment, please call your pharmacy.  Labwork: BMET ONE WEEK BEFORE SCHEDULED F/U APPT WITH DR BERRY HERE IN OUR OFFICE AT [facility]  Take the provided lab slips with you to the lab for your blood draw.   Special Instructions: PLEASE FOLLOW LOW SODIUM DIET  WEIGH DAILY AND LOG BRING TO APPT WITH YOU  Follow-Up: Your physician wants you to follow-up in: KEEP SCHEDULED APPT WITH DR Gwenlyn Found   Thank you for choosing [facility] at [facility]!!      Low-Sodium Eating Plan Sodium, which is an element that makes up salt, helps you maintain a healthy balance of fluids in your body. Too much sodium can increase your blood pressure and cause fluid and waste to be held in your body. Your health care provider or dietitian may recommend following this plan if you have high blood pressure (hypertension), kidney disease, liver disease, or heart failure. Eating less sodium can help lower your blood pressure, reduce swelling, and protect your heart, liver, and kidneys. What are tips for following this plan? General guidelines  Most people on this plan should limit their sodium intake to 1,500-2,000 mg (milligrams) of sodium each day. Reading food labels  The Nutrition Facts label lists the amount of sodium in one serving of the food. If you eat more than one serving, you must multiply the listed amount of sodium by the number of servings.  Choose foods with less than 140 mg of sodium per serving.  Avoid foods with 300 mg of sodium or more per serving. Shopping  Look for lower-sodium products, often labeled as "low-sodium" or "no salt added."  Always check the sodium content even if foods are labeled as "unsalted" or "no salt added".  Buy fresh foods. ? Avoid canned foods and  premade or frozen meals. ? Avoid canned, cured, or processed meats  Buy breads that have less than 80 mg of sodium per slice. Cooking  Eat more home-cooked food and less restaurant, buffet, and fast food.  Avoid adding salt when cooking. Use salt-free seasonings or herbs instead of table salt or sea salt. Check with your health care provider or pharmacist before using salt substitutes.  Cook with plant-based oils, such as canola, sunflower, or olive oil. Meal planning  When eating at a restaurant, ask that your food be prepared with less salt or no salt, if possible.  Avoid foods that contain MSG (monosodium glutamate). MSG is sometimes added to [place] food, bouillon, and some canned foods. What foods are recommended? The items listed may not be a complete list. Talk with your dietitian about what dietary choices are best for you. Grains Low-sodium cereals, including oats, puffed wheat and rice, and shredded wheat. Low-sodium crackers. Unsalted rice. Unsalted pasta. Low-sodium bread. Whole-grain breads and whole-grain pasta. Vegetables Fresh or frozen vegetables. "No salt added" canned vegetables. "No salt added" tomato sauce and paste. Low-sodium or reduced-sodium tomato and vegetable juice. Fruits Fresh, frozen, or canned fruit. Fruit juice. Meats and other protein foods Fresh or frozen (no salt added) meat, poultry, seafood, and fish. Low-sodium canned tuna and salmon. Unsalted nuts. Dried peas, beans, and lentils without added salt. Unsalted canned beans. Eggs. Unsalted nut butters. Dairy Milk. Soy milk. Cheese that is naturally low in sodium, such as ricotta cheese, fresh mozzarella, or Swiss cheese Low-sodium or  reduced-sodium cheese. Cream cheese. Yogurt. Fats and oils Unsalted butter. Unsalted margarine with no trans fat. Vegetable oils such as canola or olive oils. Seasonings and other foods Fresh and dried herbs and spices. Salt-free seasonings. Low-sodium mustard and  ketchup. Sodium-free salad dressing. Sodium-free light mayonnaise. Fresh or refrigerated horseradish. Lemon juice. Vinegar. Homemade, reduced-sodium, or low-sodium soups. Unsalted popcorn and pretzels. Low-salt or salt-free chips. What foods are not recommended? The items listed may not be a complete list. Talk with your dietitian about what dietary choices are best for you. Grains Instant hot cereals. Bread stuffing, pancake, and biscuit mixes. Croutons. Seasoned rice or pasta mixes. Noodle soup cups. Boxed or frozen macaroni and cheese. Regular salted crackers. Self-rising flour. Vegetables Sauerkraut, pickled vegetables, and relishes. Olives. [place] fries. Onion rings. Regular canned vegetables (not low-sodium or reduced-sodium). Regular canned tomato sauce and paste (not low-sodium or reduced-sodium). Regular tomato and vegetable juice (not low-sodium or reduced-sodium). Frozen vegetables in sauces. Meats and other protein foods Meat or fish that is salted, canned, smoked, spiced, or pickled. Bacon, ham, sausage, hotdogs, corned beef, chipped beef, packaged lunch meats, salt pork, jerky, pickled herring, anchovies, regular canned tuna, sardines, salted nuts. Dairy Processed cheese and cheese spreads. Cheese curds. Blue cheese. Feta cheese. String cheese. Regular cottage cheese. Buttermilk. Canned milk. Fats and oils Salted butter. Regular margarine. Ghee. Bacon fat. Seasonings and other foods Onion salt, garlic salt, seasoned salt, table salt, and sea salt. Canned and packaged gravies. Worcestershire sauce. Tartar sauce. Barbecue sauce. Teriyaki sauce. Soy sauce, including reduced-sodium. Steak sauce. Fish sauce. Oyster sauce. Cocktail sauce. Horseradish that you find on the shelf. Regular ketchup and mustard. Meat flavorings and tenderizers. Bouillon cubes. Hot sauce and Tabasco sauce. Premade or packaged marinades. Premade or packaged taco seasonings. Relishes. Regular salad dressings. Salsa.  Potato and tortilla chips. Corn chips and puffs. Salted popcorn and pretzels. Canned or dried soups. Pizza. Frozen entrees and pot pies. Summary  Eating less sodium can help lower your blood pressure, reduce swelling, and protect your heart, liver, and kidneys.  Most people on this plan should limit their sodium intake to 1,500-2,000 mg (milligrams) of sodium each day.  Canned, boxed, and frozen foods are high in sodium. Restaurant foods, fast foods, and pizza are also very high in sodium. You also get sodium by adding salt to food.  Try to cook at home, eat more fresh fruits and vegetables, and eat less fast food, canned, processed, or prepared foods. This information is not intended to replace advice given to you by your health care provider. Make sure you discuss any questions you have with your health care provider. Document Released: 10/30/2001 Document Revised: 05/03/2016 Document Reviewed: 05/03/2016 Elsevier Interactive Patient Education  2018 [place] With Less [employer] with less salt is one way to reduce the amount of sodium you get from food. Depending on your condition and overall health, your health care provider or diet and nutrition specialist (dietitian) may recommend that you reduce your sodium intake. Most people should have less than 2,300 milligrams (mg) of sodium each day. If you have high blood pressure (hypertension), you may need to limit your sodium to 1,500 mg each day. Follow the tips below to help reduce your sodium intake. What do I need to know about cooking with less salt? Shopping  Buy sodium-free or low-sodium products. Look for the following words on food labels: ? Low-sodium. ? Sodium-free. ? Reduced-sodium. ? No salt added. ? Unsalted.  Buy fresh  or frozen vegetables. Avoid canned vegetables.  Avoid buying meats or protein foods that have been injected with broth or saline solution.  Avoid cured or smoked meats, such as hot dogs,  bacon, salami, ham, and bologna. Reading food labels  Check the food label before buying or using packaged ingredients.  Look for products with no more than 140 mg of sodium in one serving.  Do not choose foods with salt as one of the first three ingredients on the ingredients list. If salt is one of the first three ingredients, it usually means the item is high in sodium, because ingredients are listed in order of amount in the food item. Cooking  Use herbs, seasonings without salt, and spices as substitutes for salt in foods.  Use sodium-free baking soda when baking.  Grill, braise, or roast foods to add flavor with less salt.  Avoid adding salt to pasta, rice, or hot cereals while cooking.  Drain and rinse canned vegetables before use.  Avoid adding salt when cooking sweets and desserts.  Cook with low-sodium ingredients. What are some salt alternatives? The following are herbs, seasonings, and spices that can be used instead of salt to give taste to your food. Herbs should be fresh or dried. Do not choose packaged mixes. Next to the name of the herb, spice, or seasoning are some examples of foods you can pair it with. Herbs  Bay leaves - Soups, meat and vegetable dishes, and spaghetti sauce.  Basil - [employer], soups, pasta, and fish dishes.  Cilantro - Meat, poultry, and vegetable dishes.  Chili powder - Marinades and Mexican dishes.  Chives - Salad dressings and potato dishes.  Cumin - Mexican dishes, couscous, and meat dishes.  Dill - Fish dishes, sauces, and salads.  Fennel - Meat and vegetable dishes, breads, and cookies.  Garlic (do not use garlic salt) - [place] dishes, meat dishes, salad dressings, and sauces.  Marjoram - Soups, potato dishes, and meat dishes.  Oregano - Pizza and spaghetti sauce.  Parsley - Salads, soups, pasta, and meat dishes.  Rosemary - [place] dishes, salad dressings, soups, and red meats.  Saffron - Fish dishes, pasta, and  some poultry dishes.  Sage - Stuffings and sauces.  Tarragon - Fish and [employer].  Thyme - Stuffing, meat, and fish dishes. Seasonings  Lemon juice - Fish dishes, poultry dishes, vegetables, and salads.  Vinegar - Salad dressings, vegetables, and fish dishes. Spices  Cinnamon - Sweet dishes, such as cakes, cookies, and puddings.  Cloves - Gingerbread, puddings, and marinades for meats.  Curry - Vegetable dishes, fish and poultry dishes, and stir-fry dishes.  Ginger - Vegetables dishes, fish dishes, and stir-fry dishes.  Nutmeg - Pasta, vegetables, poultry, fish dishes, and custard. What are some low-sodium ingredients and foods?  Fresh or frozen fruits and vegetables with no sauce added.  Fresh or frozen whole meats, poultry, and fish with no sauce added.  Eggs.  Noodles, pasta, quinoa, rice.  Shredded or puffed wheat or puffed rice.  Regular or quick oats.  Milk, yogurt, hard cheeses, and low-sodium cheeses. Good cheese choices include Swiss, [place]. Always check the label for the serving size and sodium content.  Unsalted butter or margarine.  Unsalted nuts.  Sherbet or ice cream (keep to  cup per serving).  Homemade pudding.  Sodium-free baking soda and baking powder. This is not a complete list of low-sodium ingredients and foods. Contact your dietitian for more options. Summary  Cooking with  less salt is one way to reduce the amount of sodium that you get from food.  Buy sodium-free or low-sodium products.  Check the food label before using or buying packaged ingredients.  Use herbs, seasonings without salt, and spices as substitutes for salt in foods. This information is not intended to replace advice given to you by your health care provider. Make sure you discuss any questions you have with your health care provider. Document Released: 05/10/2005 Document Revised: 05/18/2016 Document Reviewed: 05/18/2016 Elsevier  Interactive Patient Education  2017 [place] DASH stands for "Dietary Approaches to Stop Hypertension." The DASH eating plan is a healthy eating plan that has been shown to reduce high blood pressure (hypertension). It may also reduce your risk for type 2 diabetes, heart disease, and stroke. The DASH eating plan may also help with weight loss. What are tips for following this plan? General guidelines  Avoid eating more than 2,300 mg (milligrams) of salt (sodium) a day. If you have hypertension, you may need to reduce your sodium intake to 1,500 mg a day.  Limit alcohol intake to no more than 1 drink a day for nonpregnant women and 2 drinks a day for men. One drink equals 12 oz of beer, 5 oz of wine, or 1 oz of hard liquor.  Work with your health care provider to maintain a healthy body weight or to lose weight. Ask what an ideal weight is for you.  Get at least 30 minutes of exercise that causes your heart to beat faster (aerobic exercise) most days of the week. Activities may include walking, swimming, or biking.  Work with your health care provider or diet and nutrition specialist (dietitian) to adjust your eating plan to your individual calorie needs. Reading food labels  Check food labels for the amount of sodium per serving. Choose foods with less than 5 percent of the Daily Value of sodium. Generally, foods with less than 300 mg of sodium per serving fit into this eating plan.  To find whole grains, look for the word "whole" as the first word in the ingredient list. Shopping  Buy products labeled as "low-sodium" or "no salt added."  Buy fresh foods. Avoid canned foods and premade or frozen meals. Cooking  Avoid adding salt when cooking. Use salt-free seasonings or herbs instead of table salt or sea salt. Check with your health care provider or pharmacist before using salt substitutes.  Do not fry foods. Cook foods using healthy methods such as baking,  boiling, grilling, and broiling instead.  Cook with heart-healthy oils, such as olive, canola, soybean, or sunflower oil. Meal planning   Eat a balanced diet that includes: ? 5 or more servings of fruits and vegetables each day. At each meal, try to fill half of your plate with fruits and vegetables. ? Up to 6-8 servings of whole grains each day. ? Less than 6 oz of lean meat, poultry, or fish each day. A 3-oz serving of meat is about the same size as a deck of cards. One egg equals 1 oz. ? 2 servings of low-fat dairy each day. ? A serving of nuts, seeds, or beans 5 times each week. ? Heart-healthy fats. Healthy fats called Omega-3 fatty acids are found in foods such as flaxseeds and coldwater fish, like sardines, salmon, and mackerel.  Limit how much you eat of the following: ? Canned or prepackaged foods. ? Food that is high in trans fat, such as fried foods. ?  Food that is high in saturated fat, such as fatty meat. ? Sweets, desserts, sugary drinks, and other foods with added sugar. ? Full-fat dairy products.  Do not salt foods before eating.  Try to eat at least 2 vegetarian meals each week.  Eat more home-cooked food and less restaurant, buffet, and fast food.  When eating at a restaurant, ask that your food be prepared with less salt or no salt, if possible. What foods are recommended? The items listed may not be a complete list. Talk with your dietitian about what dietary choices are best for you. Grains Whole-grain or whole-wheat bread. Whole-grain or whole-wheat pasta. Brown rice. [place]. Bulgur. Whole-grain and low-sodium cereals. Pita bread. Low-fat, low-sodium crackers. Whole-wheat flour tortillas. Vegetables Fresh or frozen vegetables (raw, steamed, roasted, or grilled). Low-sodium or reduced-sodium tomato and vegetable juice. Low-sodium or reduced-sodium tomato sauce and tomato paste. Low-sodium or reduced-sodium canned vegetables. Fruits All fresh, dried, or  frozen fruit. Canned fruit in natural juice (without added sugar). Meat and other protein foods Skinless chicken or [place]. Ground chicken or [place]. Pork with fat trimmed off. Fish and seafood. Egg whites. Dried beans, peas, or lentils. Unsalted nuts, nut butters, and seeds. Unsalted canned beans. Lean cuts of beef with fat trimmed off. Low-sodium, lean deli meat. Dairy Low-fat (1%) or fat-free (skim) milk. Fat-free, low-fat, or reduced-fat cheeses. Nonfat, low-sodium ricotta or cottage cheese. Low-fat or nonfat yogurt. Low-fat, low-sodium cheese. Fats and oils Soft margarine without trans fats. Vegetable oil. Low-fat, reduced-fat, or light mayonnaise and salad dressings (reduced-sodium). Canola, safflower, olive, soybean, and sunflower oils. Avocado. Seasoning and other foods Herbs. Spices. Seasoning mixes without salt. Unsalted popcorn and pretzels. Fat-free sweets. What foods are not recommended? The items listed may not be a complete list. Talk with your dietitian about what dietary choices are best for you. Grains Baked goods made with fat, such as croissants, muffins, or some breads. Dry pasta or rice meal packs. Vegetables Creamed or fried vegetables. Vegetables in a cheese sauce. Regular canned vegetables (not low-sodium or reduced-sodium). Regular canned tomato sauce and paste (not low-sodium or reduced-sodium). Regular tomato and vegetable juice (not low-sodium or reduced-sodium). Angie Fava. Olives. Fruits Canned fruit in a light or heavy syrup. Fried fruit. Fruit in cream or butter sauce. Meat and other protein foods Fatty cuts of meat. Ribs. Fried meat. Berniece Salines. Sausage. Bologna and other processed lunch meats. Salami. Fatback. Hotdogs. Bratwurst. Salted nuts and seeds. Canned beans with added salt. Canned or smoked fish. Whole eggs or egg yolks. Chicken or [place] with skin. Dairy Whole or 2% milk, cream, and half-and-half. Whole or full-fat cream cheese. Whole-fat or sweetened yogurt.  Full-fat cheese. Nondairy creamers. Whipped toppings. Processed cheese and cheese spreads. Fats and oils Butter. Stick margarine. Lard. Shortening. Ghee. Bacon fat. Tropical oils, such as coconut, palm kernel, or palm oil. Seasoning and other foods Salted popcorn and pretzels. Onion salt, garlic salt, seasoned salt, table salt, and sea salt. Worcestershire sauce. Tartar sauce. Barbecue sauce. Teriyaki sauce. Soy sauce, including reduced-sodium. Steak sauce. Canned and packaged gravies. Fish sauce. Oyster sauce. Cocktail sauce. Horseradish that you find on the shelf. Ketchup. Mustard. Meat flavorings and tenderizers. Bouillon cubes. Hot sauce and Tabasco sauce. Premade or packaged marinades. Premade or packaged taco seasonings. Relishes. Regular salad dressings. Where to find more information:  National Heart, Lung, and [place]: https://wilson-eaton.com/  American Heart Association: www.heart.org Summary  The DASH eating plan is a healthy eating plan that has been shown to reduce high blood  pressure (hypertension). It may also reduce your risk for type 2 diabetes, heart disease, and stroke.  With the DASH eating plan, you should limit salt (sodium) intake to 2,300 mg a day. If you have hypertension, you may need to reduce your sodium intake to 1,500 mg a day.  When on the DASH eating plan, aim to eat more fresh fruits and vegetables, whole grains, lean proteins, low-fat dairy, and heart-healthy fats.  Work with your health care provider or diet and nutrition specialist (dietitian) to adjust your eating plan to your individual calorie needs. This information is not intended to replace advice given to you by your health care provider. Make sure you discuss any questions you have with your health care provider. Document Released: 04/29/2011 Document Revised: 05/03/2016 Document Reviewed: 05/03/2016 Elsevier Interactive Patient Education  [employer].

## 2017-11-11 ENCOUNTER — Encounter: Payer: Self-pay | Admitting: *Deleted

## 2017-11-19 LAB — BASIC METABOLIC PANEL
BUN/Creatinine Ratio: 11 (ref 9–20)
BUN: 19 mg/dL (ref 6–24)
CALCIUM: 8.8 mg/dL (ref 8.7–10.2)
CO2: 33 mmol/L — AB (ref 20–29)
CREATININE: 1.8 mg/dL — AB (ref 0.76–1.27)
Chloride: 93 mmol/L — ABNORMAL LOW (ref 96–106)
GFR calc Af Amer: 49 mL/min/{1.73_m2} — ABNORMAL LOW (ref >59)
GFR calc non Af Amer: 42 mL/min/{1.73_m2} — ABNORMAL LOW (ref >59)
Glucose: 206 mg/dL — ABNORMAL HIGH (ref 65–99)
POTASSIUM: 3.4 mmol/L — AB (ref 3.5–5.2)
Sodium: 144 mmol/L (ref 134–144)

## 2017-11-22 ENCOUNTER — Encounter: Payer: Self-pay | Admitting: Cardiovascular Disease

## 2017-11-22 ENCOUNTER — Ambulatory Visit (INDEPENDENT_AMBULATORY_CARE_PROVIDER_SITE_OTHER): Payer: BC Managed Care – PPO | Admitting: Cardiovascular Disease

## 2017-11-22 DIAGNOSIS — I5022 Chronic systolic (congestive) heart failure: Secondary | ICD-10-CM | POA: Diagnosis not present

## 2017-11-22 DIAGNOSIS — I481 Persistent atrial fibrillation: Secondary | ICD-10-CM

## 2017-11-22 DIAGNOSIS — I4819 Other persistent atrial fibrillation: Secondary | ICD-10-CM

## 2017-11-22 DIAGNOSIS — I5023 Acute on chronic systolic (congestive) heart failure: Secondary | ICD-10-CM | POA: Insufficient documentation

## 2017-11-22 MED ORDER — CARVEDILOL 6.25 MG PO TABS: 6.2500 mg | ORAL_TABLET | Freq: Two times a day (BID) | ORAL | 3 refills | Status: DC

## 2017-11-22 MED ORDER — FUROSEMIDE 40 MG PO TABS: 40.0000 mg | ORAL_TABLET | Freq: Every day | ORAL | 1 refills | Status: DC

## 2017-11-22 NOTE — Progress Notes (Signed)
11/22/2017 Jay Chen   May 02, 1966  [id number]  Primary Physician [name], MD Primary Cardiologist: [name] MD [name], [place]  HPI:  Jay Chen is a 52 y.o.  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  He was previously a patient of [name] who saw him back in 2013.    I last saw him in the office 10/21/2017.  He has a history of essential hypertension, diabetes, hypertriglyceridemia and cor pulmonale as well as a thoracic aortic aneurysm followed by [name] in the past.  He was referred by his PCP, Dr. [name], for atrial fibrillation.  This is currently rate controlled on beta-blocker which was recently added.  He was also placed on Xarelto.  His shortness of breath is chronic.  He does say that he is gained weight over the last month and has had increasing lower extremity edema.  He has obstructive sleep apnea but does not wear CPAP.  Since I saw him 5 weeks ago I did place him on a diuretic which resulted in marked diuresis.  He is lost approximately 50 pounds.  Lower extremity edema has improved as well.  A 2D echocardiogram performed on 10/31/2017 showed severe LV dilatation with an EF of 20%, markedly depressed compared to his previous echo 5 years before.  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  His symptoms are at least class III.   Current Meds  Medication Sig  . albuterol (PROVENTIL HFA;VENTOLIN HFA) 108 (90 BASE) MCG/ACT inhaler Inhale 2 puffs into the lungs every 6 (six) hours as needed for wheezing or shortness of breath.  . allopurinol (ZYLOPRIM) 100 MG tablet Take 1 tablet (100 mg total) by mouth daily.  [name] atorvastatin (LIPITOR) 40 MG tablet Take 1 tablet (40 mg total) by mouth daily.  . cloNIDine (CATAPRES) 0.1 MG tablet Take 1 tab in the am and 2 tab every pm.  . dapagliflozin propanediol (FARXIGA) 5 MG TABS tablet Take 5 mg by mouth daily.    . Exenatide ER (BYDUREON BCISE) 2 MG/0.85ML AUIJ Inject 1 Act into the skin once a week.  . furosemide (LASIX) 40 MG tablet Take 1.5 tablets (60 mg total) by mouth daily.  [name] glucose blood (BAYER CONTOUR NEXT TEST) test strip Use BID  . Lancets Misc. (UNISTIK 1) MISC Test up to twice daily  . magnesium oxide (MAG-OX) 400 MG tablet Take 1 tablet (400 mg total) by mouth daily.  . metFORMIN (GLUCOPHAGE) 1000 MG tablet Take 1 tablet (1,000 mg total) by mouth 2 (two) times daily with a meal.  . metoprolol succinate (TOPROL-XL) 25 MG 24 hr tablet Take 1 tablet (25 mg total) by mouth daily.  [name] omega-3 acid ethyl esters (LOVAZA) 1 G capsule Take 2 capsules (2 g total) by mouth 2 (two) times daily.  . potassium chloride SA (KLOR-CON M20) 20 MEQ tablet Take 2 tablets (40 mEq total) by mouth daily.  . rivaroxaban (XARELTO) 20 MG TABS tablet Take 1 tablet (20 mg total) by mouth daily with supper.  . sacubitril-valsartan (ENTRESTO) 24-26 MG Take 1 tablet by mouth 2 (two) times daily.     No Known Allergies  Social History   Socioeconomic History  . Marital status: Married    Spouse name: Not on file  . Number of children: Not on file  . Years of education: Not on file  .  Highest education level: Not on file  Occupational History  . Not on file  Social Needs  . Financial resource strain: Not on file  . Food insecurity:    Worry: Not on file    Inability: Not on file  . Transportation needs:    Medical: Not on file    Non-medical: Not on file  Tobacco Use  . Smoking status: Never Smoker  . Smokeless tobacco: Never Used  Substance and Sexual Activity  . Alcohol use: Yes    Alcohol/week: 3.0 oz    Types: 5 Cans of beer per week    Comment: Rarely  . Drug use: No  . Sexual activity: Not Currently  Lifestyle  . Physical activity:    Days per week: Not on file    Minutes per session: Not on file  . Stress: Not on file  Relationships  . Social connections:    Talks on phone: Not on file     Gets together: Not on file    Attends religious service: Not on file    Active member of club or organization: Not on file    Attends meetings of clubs or organizations: Not on file    Relationship status: Not on file  . Intimate partner violence:    Fear of current or ex partner: Not on file    Emotionally abused: Not on file    Physically abused: Not on file    Forced sexual activity: Not on file  Other Topics Concern  . Not on file  Social History Narrative  . Not on file     Review of Systems: General: negative for chills, fever, night sweats or weight changes.  Cardiovascular: negative for chest pain, dyspnea on exertion, edema, orthopnea, palpitations, paroxysmal nocturnal dyspnea or shortness of breath Dermatological: negative for rash Respiratory: negative for cough or wheezing Urologic: negative for hematuria Abdominal: negative for nausea, vomiting, diarrhea, bright red blood per rectum, melena, or hematemesis Neurologic: negative for visual changes, syncope, or dizziness All other systems reviewed and are otherwise negative except as noted above.    Blood pressure 120/88, pulse (!) 114, height 5\' 8"  (1.727 m), weight (!) 363 lb (164.7 kg).  General appearance: alert and no distress Neck: no adenopathy, no carotid bruit, no JVD, supple, symmetrical, trachea midline and thyroid not enlarged, symmetric, no tenderness/mass/nodules Lungs: clear to auscultation bilaterally Heart: irregularly irregular rhythm Extremities: 2-3+ pitting edema bilaterally Pulses: 2+ and symmetric Skin: Skin color, texture, turgor normal. No rashes or lesions Neurologic: Alert and oriented X 3, normal strength and tone. Normal symmetric reflexes. Normal coordination and gait  EKG not performed today  ASSESSMENT AND PLAN:   Persistent atrial fibrillation (HCC) Is in atrial fibrillation metoprolol though not rate controlled.  He is on Xarelto oral anticoagulation.  Chronic systolic  heart failure [facility]) Mr. Tollie Pizza returns today for follow-up.  I last saw him in the office 10/21/2017.  He was clearly volume overloaded.  I began him on furosemide 40 mg a day which was uptitrated to 60 mg a day by Jory Sims.  He has lost almost 50 pounds with markedly improved lower extremity edema and shortness of breath.  I am going to change his metoprolol to carvedilol.  I am going to decrease his furosemide from 60-40 because of elevated creatinine.  He is already on Entresto.  We will titrate his current carvedilol.  I am going to refer him to the heart failure clinic for further pharmacologic optimization and consideration  of referral for ICD implantation for primary prevention.      [name] MD FACP,FACC,FAHA, [facility] 11/22/2017 2:46 PM

## 2017-11-22 NOTE — Patient Instructions (Signed)
Medication Instructions: Your physician recommends that you continue on your current medications as directed. Please refer to the Current Medication list given to you today.  Decrease Furosemide to 40 mg daily.  STOP Metoprolol   START Carvedilol 6.25 mg twice daily.  Labwork: Your physician recommends that you return for lab work in: 7-10 days  Follow-Up:.  You have been referred to the Heart Failure Clinic.  Your physician recommends that you schedule a follow-up appointment in: 2 weeks with PhamD  We request that you follow-up in: 3 months with Jay Sims, NP and in 6 months with Dr Andria Rhein will receive a reminder letter in the mail two months in advance. If you don't receive a letter, please call our office to schedule the follow-up appointment.  If you need a refill on your cardiac medications before your next appointment, please call your pharmacy.

## 2017-11-22 NOTE — Assessment & Plan Note (Signed)
Is in atrial fibrillation metoprolol though not rate controlled.  He is on Xarelto oral anticoagulation.

## 2017-11-22 NOTE — Assessment & Plan Note (Addendum)
Mr. Tollie Pizza returns today for follow-up.  I last saw him in the office 10/21/2017.  He was clearly volume overloaded.  I began him on furosemide 40 mg a day which was uptitrated to 60 mg a day by Jory Sims.  He has lost almost 50 pounds with markedly improved lower extremity edema and shortness of breath.  I am going to change his metoprolol to carvedilol.  I am going to decrease his furosemide from 60-40 because of elevated creatinine.  He is already on Entresto.  We will titrate his current carvedilol.  I am going to refer him to the heart failure clinic for further pharmacologic optimization and consideration of referral for ICD implantation for primary prevention.

## 2017-11-23 ENCOUNTER — Telehealth (HOSPITAL_COMMUNITY): Payer: Self-pay | Admitting: Vascular Surgery

## 2017-11-23 NOTE — Telephone Encounter (Signed)
Left pt message to make NEW CHF appt 8/12 @ 2:40 w/ DB

## 2017-11-29 ENCOUNTER — Telehealth (HOSPITAL_COMMUNITY): Payer: Self-pay | Admitting: Vascular Surgery

## 2017-11-29 NOTE — Telephone Encounter (Signed)
Left pt second message to make new pt chf 8/12

## 2017-12-14 ENCOUNTER — Telehealth (HOSPITAL_COMMUNITY): Payer: Self-pay | Admitting: Vascular Surgery

## 2017-12-14 NOTE — Telephone Encounter (Signed)
Left 3rd message to contact our office to make new hf appt

## 2017-12-15 ENCOUNTER — Ambulatory Visit: Payer: BC Managed Care – PPO

## 2017-12-27 ENCOUNTER — Encounter (HOSPITAL_COMMUNITY): Payer: Self-pay | Admitting: Emergency Medicine

## 2017-12-27 ENCOUNTER — Other Ambulatory Visit: Payer: Self-pay

## 2017-12-27 ENCOUNTER — Inpatient Hospital Stay (HOSPITAL_COMMUNITY)
Admission: EM | Admit: 2017-12-27 | Discharge: 2018-01-04 | DRG: 682 | Disposition: A | Discharge status: Rehabilitation Facility | Payer: BC Managed Care – PPO | Attending: Internal Medicine | Admitting: Internal Medicine

## 2017-12-27 DIAGNOSIS — I5043 Acute on chronic combined systolic (congestive) and diastolic (congestive) heart failure: Secondary | ICD-10-CM | POA: Diagnosis present

## 2017-12-27 DIAGNOSIS — E876 Hypokalemia: Secondary | ICD-10-CM | POA: Diagnosis present

## 2017-12-27 DIAGNOSIS — N179 Acute kidney failure, unspecified: Secondary | ICD-10-CM | POA: Diagnosis not present

## 2017-12-27 DIAGNOSIS — I6340 Cerebral infarction due to embolism of unspecified cerebral artery: Secondary | ICD-10-CM

## 2017-12-27 DIAGNOSIS — M4802 Spinal stenosis, cervical region: Secondary | ICD-10-CM | POA: Diagnosis present

## 2017-12-27 DIAGNOSIS — E662 Morbid (severe) obesity with alveolar hypoventilation: Secondary | ICD-10-CM | POA: Diagnosis present

## 2017-12-27 DIAGNOSIS — J189 Pneumonia, unspecified organism: Secondary | ICD-10-CM

## 2017-12-27 DIAGNOSIS — G4733 Obstructive sleep apnea (adult) (pediatric): Secondary | ICD-10-CM | POA: Diagnosis present

## 2017-12-27 DIAGNOSIS — I5082 Biventricular heart failure: Secondary | ICD-10-CM | POA: Diagnosis present

## 2017-12-27 DIAGNOSIS — E785 Hyperlipidemia, unspecified: Secondary | ICD-10-CM | POA: Diagnosis present

## 2017-12-27 DIAGNOSIS — M549 Dorsalgia, unspecified: Secondary | ICD-10-CM

## 2017-12-27 DIAGNOSIS — I712 Thoracic aortic aneurysm, without rupture: Secondary | ICD-10-CM | POA: Diagnosis present

## 2017-12-27 DIAGNOSIS — M50221 Other cervical disc displacement at C4-C5 level: Secondary | ICD-10-CM | POA: Diagnosis present

## 2017-12-27 DIAGNOSIS — Z7984 Long term (current) use of oral hypoglycemic drugs: Secondary | ICD-10-CM

## 2017-12-27 DIAGNOSIS — M4726 Other spondylosis with radiculopathy, lumbar region: Secondary | ICD-10-CM | POA: Diagnosis present

## 2017-12-27 DIAGNOSIS — I2729 Other secondary pulmonary hypertension: Secondary | ICD-10-CM | POA: Diagnosis present

## 2017-12-27 DIAGNOSIS — Z79899 Other long term (current) drug therapy: Secondary | ICD-10-CM

## 2017-12-27 DIAGNOSIS — R109 Unspecified abdominal pain: Secondary | ICD-10-CM | POA: Diagnosis present

## 2017-12-27 DIAGNOSIS — N200 Calculus of kidney: Secondary | ICD-10-CM | POA: Diagnosis present

## 2017-12-27 DIAGNOSIS — J9811 Atelectasis: Secondary | ICD-10-CM | POA: Diagnosis present

## 2017-12-27 DIAGNOSIS — Z7951 Long term (current) use of inhaled steroids: Secondary | ICD-10-CM

## 2017-12-27 DIAGNOSIS — J9601 Acute respiratory failure with hypoxia: Secondary | ICD-10-CM | POA: Diagnosis present

## 2017-12-27 DIAGNOSIS — E1122 Type 2 diabetes mellitus with diabetic chronic kidney disease: Secondary | ICD-10-CM | POA: Diagnosis present

## 2017-12-27 DIAGNOSIS — M109 Gout, unspecified: Secondary | ICD-10-CM | POA: Diagnosis present

## 2017-12-27 DIAGNOSIS — N183 Chronic kidney disease, stage 3 (moderate): Secondary | ICD-10-CM | POA: Diagnosis present

## 2017-12-27 DIAGNOSIS — Z09 Encounter for follow-up examination after completed treatment for conditions other than malignant neoplasm: Secondary | ICD-10-CM

## 2017-12-27 DIAGNOSIS — R10A0 Flank pain, unspecified side: Secondary | ICD-10-CM | POA: Diagnosis present

## 2017-12-27 DIAGNOSIS — J690 Pneumonitis due to inhalation of food and vomit: Secondary | ICD-10-CM

## 2017-12-27 DIAGNOSIS — I509 Heart failure, unspecified: Secondary | ICD-10-CM

## 2017-12-27 DIAGNOSIS — G9520 Unspecified cord compression: Secondary | ICD-10-CM

## 2017-12-27 DIAGNOSIS — M5416 Radiculopathy, lumbar region: Secondary | ICD-10-CM

## 2017-12-27 DIAGNOSIS — G992 Myelopathy in diseases classified elsewhere: Secondary | ICD-10-CM | POA: Diagnosis present

## 2017-12-27 DIAGNOSIS — I481 Persistent atrial fibrillation: Secondary | ICD-10-CM | POA: Diagnosis present

## 2017-12-27 DIAGNOSIS — J181 Lobar pneumonia, unspecified organism: Secondary | ICD-10-CM | POA: Diagnosis present

## 2017-12-27 DIAGNOSIS — I5042 Chronic combined systolic (congestive) and diastolic (congestive) heart failure: Secondary | ICD-10-CM

## 2017-12-27 DIAGNOSIS — Z7901 Long term (current) use of anticoagulants: Secondary | ICD-10-CM

## 2017-12-27 DIAGNOSIS — E869 Volume depletion, unspecified: Secondary | ICD-10-CM | POA: Diagnosis present

## 2017-12-27 DIAGNOSIS — I130 Hypertensive heart and chronic kidney disease with heart failure and stage 1 through stage 4 chronic kidney disease, or unspecified chronic kidney disease: Secondary | ICD-10-CM | POA: Diagnosis present

## 2017-12-27 DIAGNOSIS — I420 Dilated cardiomyopathy: Secondary | ICD-10-CM

## 2017-12-27 DIAGNOSIS — M5116 Intervertebral disc disorders with radiculopathy, lumbar region: Secondary | ICD-10-CM | POA: Diagnosis present

## 2017-12-27 DIAGNOSIS — I890 Lymphedema, not elsewhere classified: Secondary | ICD-10-CM | POA: Diagnosis present

## 2017-12-27 DIAGNOSIS — I4891 Unspecified atrial fibrillation: Secondary | ICD-10-CM

## 2017-12-27 HISTORY — DX: Chronic kidney disease, stage 3 unspecified: N18.30

## 2017-12-27 HISTORY — DX: Thoracic aortic aneurysm, without rupture: I71.2

## 2017-12-27 HISTORY — DX: Type 2 diabetes mellitus without complications: E11.9

## 2017-12-27 HISTORY — DX: Chronic kidney disease, stage 3 (moderate): N18.3

## 2017-12-27 HISTORY — DX: Cor pulmonale (chronic): I27.81

## 2017-12-27 HISTORY — DX: Gout, unspecified: M10.9

## 2017-12-27 HISTORY — DX: Thoracic aortic aneurysm, without rupture, unspecified: I71.20

## 2017-12-27 HISTORY — DX: Other persistent atrial fibrillation: I48.19

## 2017-12-27 NOTE — ED Triage Notes (Signed)
Pt reports 10/10 rt side flank pain that started today, pain upon movement. Denies urinary sx, n/v. In triage, pt noted to have SpO2 at 88% room air. Pt does not wear O2 at home. Placed on 2L in triage. Hx of Afib and HTN, compliant with medications.

## 2017-12-28 ENCOUNTER — Emergency Department (HOSPITAL_COMMUNITY): Payer: BC Managed Care – PPO

## 2017-12-28 ENCOUNTER — Inpatient Hospital Stay (HOSPITAL_COMMUNITY): Payer: BC Managed Care – PPO

## 2017-12-28 ENCOUNTER — Other Ambulatory Visit: Payer: Self-pay

## 2017-12-28 ENCOUNTER — Encounter (HOSPITAL_COMMUNITY): Payer: Self-pay | Admitting: Emergency Medicine

## 2017-12-28 DIAGNOSIS — E1165 Type 2 diabetes mellitus with hyperglycemia: Secondary | ICD-10-CM | POA: Diagnosis not present

## 2017-12-28 DIAGNOSIS — M4712 Other spondylosis with myelopathy, cervical region: Secondary | ICD-10-CM | POA: Diagnosis not present

## 2017-12-28 DIAGNOSIS — R5381 Other malaise: Secondary | ICD-10-CM | POA: Diagnosis not present

## 2017-12-28 DIAGNOSIS — N179 Acute kidney failure, unspecified: Secondary | ICD-10-CM | POA: Diagnosis present

## 2017-12-28 DIAGNOSIS — I5042 Chronic combined systolic (congestive) and diastolic (congestive) heart failure: Secondary | ICD-10-CM | POA: Insufficient documentation

## 2017-12-28 DIAGNOSIS — I10 Essential (primary) hypertension: Secondary | ICD-10-CM | POA: Diagnosis not present

## 2017-12-28 DIAGNOSIS — I6789 Other cerebrovascular disease: Secondary | ICD-10-CM | POA: Diagnosis not present

## 2017-12-28 DIAGNOSIS — E1122 Type 2 diabetes mellitus with diabetic chronic kidney disease: Secondary | ICD-10-CM | POA: Diagnosis present

## 2017-12-28 DIAGNOSIS — I6340 Cerebral infarction due to embolism of unspecified cerebral artery: Secondary | ICD-10-CM | POA: Diagnosis present

## 2017-12-28 DIAGNOSIS — E1169 Type 2 diabetes mellitus with other specified complication: Secondary | ICD-10-CM | POA: Diagnosis not present

## 2017-12-28 DIAGNOSIS — N183 Chronic kidney disease, stage 3 (moderate): Secondary | ICD-10-CM | POA: Diagnosis present

## 2017-12-28 DIAGNOSIS — E876 Hypokalemia: Secondary | ICD-10-CM | POA: Diagnosis present

## 2017-12-28 DIAGNOSIS — E785 Hyperlipidemia, unspecified: Secondary | ICD-10-CM | POA: Diagnosis present

## 2017-12-28 DIAGNOSIS — J9601 Acute respiratory failure with hypoxia: Secondary | ICD-10-CM | POA: Diagnosis present

## 2017-12-28 DIAGNOSIS — G4733 Obstructive sleep apnea (adult) (pediatric): Secondary | ICD-10-CM | POA: Diagnosis not present

## 2017-12-28 DIAGNOSIS — Z7951 Long term (current) use of inhaled steroids: Secondary | ICD-10-CM | POA: Diagnosis not present

## 2017-12-28 DIAGNOSIS — E662 Morbid (severe) obesity with alveolar hypoventilation: Secondary | ICD-10-CM | POA: Diagnosis present

## 2017-12-28 DIAGNOSIS — I712 Thoracic aortic aneurysm, without rupture: Secondary | ICD-10-CM

## 2017-12-28 DIAGNOSIS — I5032 Chronic diastolic (congestive) heart failure: Secondary | ICD-10-CM | POA: Diagnosis not present

## 2017-12-28 DIAGNOSIS — M109 Gout, unspecified: Secondary | ICD-10-CM | POA: Diagnosis present

## 2017-12-28 DIAGNOSIS — Z7984 Long term (current) use of oral hypoglycemic drugs: Secondary | ICD-10-CM | POA: Diagnosis not present

## 2017-12-28 DIAGNOSIS — J189 Pneumonia, unspecified organism: Secondary | ICD-10-CM | POA: Diagnosis not present

## 2017-12-28 DIAGNOSIS — R109 Unspecified abdominal pain: Secondary | ICD-10-CM | POA: Diagnosis not present

## 2017-12-28 DIAGNOSIS — I4891 Unspecified atrial fibrillation: Secondary | ICD-10-CM | POA: Diagnosis not present

## 2017-12-28 DIAGNOSIS — G992 Myelopathy in diseases classified elsewhere: Secondary | ICD-10-CM | POA: Diagnosis present

## 2017-12-28 DIAGNOSIS — I482 Chronic atrial fibrillation: Secondary | ICD-10-CM | POA: Diagnosis not present

## 2017-12-28 DIAGNOSIS — Z79899 Other long term (current) drug therapy: Secondary | ICD-10-CM | POA: Diagnosis not present

## 2017-12-28 DIAGNOSIS — I2729 Other secondary pulmonary hypertension: Secondary | ICD-10-CM | POA: Diagnosis present

## 2017-12-28 DIAGNOSIS — M5415 Radiculopathy, thoracolumbar region: Secondary | ICD-10-CM | POA: Diagnosis not present

## 2017-12-28 DIAGNOSIS — I130 Hypertensive heart and chronic kidney disease with heart failure and stage 1 through stage 4 chronic kidney disease, or unspecified chronic kidney disease: Secondary | ICD-10-CM | POA: Diagnosis present

## 2017-12-28 DIAGNOSIS — J9811 Atelectasis: Secondary | ICD-10-CM | POA: Diagnosis present

## 2017-12-28 DIAGNOSIS — I63412 Cerebral infarction due to embolism of left middle cerebral artery: Secondary | ICD-10-CM | POA: Diagnosis not present

## 2017-12-28 DIAGNOSIS — G9520 Unspecified cord compression: Secondary | ICD-10-CM | POA: Diagnosis not present

## 2017-12-28 DIAGNOSIS — I509 Heart failure, unspecified: Secondary | ICD-10-CM | POA: Diagnosis not present

## 2017-12-28 DIAGNOSIS — M4726 Other spondylosis with radiculopathy, lumbar region: Secondary | ICD-10-CM | POA: Diagnosis present

## 2017-12-28 DIAGNOSIS — I481 Persistent atrial fibrillation: Secondary | ICD-10-CM | POA: Diagnosis present

## 2017-12-28 DIAGNOSIS — I5043 Acute on chronic combined systolic (congestive) and diastolic (congestive) heart failure: Secondary | ICD-10-CM | POA: Diagnosis present

## 2017-12-28 DIAGNOSIS — E1142 Type 2 diabetes mellitus with diabetic polyneuropathy: Secondary | ICD-10-CM | POA: Diagnosis not present

## 2017-12-28 DIAGNOSIS — M549 Dorsalgia, unspecified: Secondary | ICD-10-CM | POA: Diagnosis present

## 2017-12-28 DIAGNOSIS — I639 Cerebral infarction, unspecified: Secondary | ICD-10-CM | POA: Diagnosis not present

## 2017-12-28 DIAGNOSIS — I5082 Biventricular heart failure: Secondary | ICD-10-CM | POA: Diagnosis present

## 2017-12-28 DIAGNOSIS — I6389 Other cerebral infarction: Secondary | ICD-10-CM | POA: Diagnosis not present

## 2017-12-28 DIAGNOSIS — R2689 Other abnormalities of gait and mobility: Secondary | ICD-10-CM | POA: Diagnosis not present

## 2017-12-28 DIAGNOSIS — E669 Obesity, unspecified: Secondary | ICD-10-CM | POA: Diagnosis not present

## 2017-12-28 DIAGNOSIS — I420 Dilated cardiomyopathy: Secondary | ICD-10-CM | POA: Diagnosis present

## 2017-12-28 DIAGNOSIS — M5416 Radiculopathy, lumbar region: Secondary | ICD-10-CM | POA: Diagnosis not present

## 2017-12-28 DIAGNOSIS — J181 Lobar pneumonia, unspecified organism: Secondary | ICD-10-CM | POA: Diagnosis present

## 2017-12-28 DIAGNOSIS — M5410 Radiculopathy, site unspecified: Secondary | ICD-10-CM | POA: Diagnosis not present

## 2017-12-28 DIAGNOSIS — Z7901 Long term (current) use of anticoagulants: Secondary | ICD-10-CM | POA: Diagnosis not present

## 2017-12-28 LAB — CBC
HCT: 41.9 % (ref 39.0–52.0)
Hemoglobin: 13.9 g/dL (ref 13.0–17.0)
MCH: 31.3 pg (ref 26.0–34.0)
MCHC: 33.2 g/dL (ref 30.0–36.0)
MCV: 94.4 fL (ref 78.0–100.0)
PLATELETS: 242 10*3/uL (ref 150–400)
RBC: 4.44 MIL/uL (ref 4.22–5.81)
RDW: 13.6 % (ref 11.5–15.5)
WBC: 9.8 10*3/uL (ref 4.0–10.5)

## 2017-12-28 LAB — CBG MONITORING, ED: GLUCOSE-CAPILLARY: 179 mg/dL — AB (ref 70–99)

## 2017-12-28 LAB — GLUCOSE, CAPILLARY: Glucose-Capillary: 197 mg/dL — ABNORMAL HIGH (ref 70–99)

## 2017-12-28 LAB — BASIC METABOLIC PANEL
Anion gap: 16 — ABNORMAL HIGH (ref 5–15)
BUN: 56 mg/dL — AB (ref 6–20)
CO2: 30 mmol/L (ref 22–32)
Calcium: 9.2 mg/dL (ref 8.9–10.3)
Chloride: 91 mmol/L — ABNORMAL LOW (ref 98–111)
Creatinine, Ser: 2.72 mg/dL — ABNORMAL HIGH (ref 0.61–1.24)
GFR calc Af Amer: 29 mL/min — ABNORMAL LOW (ref >60)
GFR, EST NON AFRICAN AMERICAN: 25 mL/min — AB (ref >60)
Glucose, Bld: 174 mg/dL — ABNORMAL HIGH (ref 70–99)
Potassium: 3.3 mmol/L — ABNORMAL LOW (ref 3.5–5.1)
Sodium: 137 mmol/L (ref 135–145)

## 2017-12-28 LAB — URINALYSIS, ROUTINE W REFLEX MICROSCOPIC
Bilirubin Urine: NEGATIVE
Glucose, UA: NEGATIVE mg/dL
Hgb urine dipstick: NEGATIVE
KETONES UR: NEGATIVE mg/dL
Leukocytes, UA: NEGATIVE
NITRITE: NEGATIVE
PH: 5 (ref 5.0–8.0)
Protein, ur: NEGATIVE mg/dL
Specific Gravity, Urine: 1.018 (ref 1.005–1.030)

## 2017-12-28 LAB — BRAIN NATRIURETIC PEPTIDE: B Natriuretic Peptide: 283.3 pg/mL — ABNORMAL HIGH (ref 0.0–100.0)

## 2017-12-28 MED ORDER — SODIUM CHLORIDE 0.9% FLUSH
3.0000 mL | Freq: Two times a day (BID) | INTRAVENOUS | Status: DC
  Administered 2017-12-28 – 2018-01-04 (×15): 3 mL via INTRAVENOUS

## 2017-12-28 MED ORDER — ALBUTEROL SULFATE (2.5 MG/3ML) 0.083% IN NEBU
2.5000 mg | INHALATION_SOLUTION | RESPIRATORY_TRACT | Status: DC | PRN
Start: 2017-12-28 — End: 2018-01-04

## 2017-12-28 MED ORDER — FUROSEMIDE 10 MG/ML IJ SOLN: 20.0000 mg | Freq: Two times a day (BID) | INTRAMUSCULAR | Status: DC

## 2017-12-28 MED ORDER — RIVAROXABAN 15 MG PO TABS
15.0000 mg | ORAL_TABLET | Freq: Every day | ORAL | Status: DC
  Administered 2017-12-28 – 2017-12-29 (×2): 15 mg via ORAL
  Filled 2017-12-28 (×2): qty 1

## 2017-12-28 MED ORDER — INSULIN ASPART 100 UNIT/ML ~~LOC~~ SOLN
0.0000 [IU] | Freq: Three times a day (TID) | SUBCUTANEOUS | Status: DC
  Administered 2017-12-28: 2 [IU] via SUBCUTANEOUS
  Administered 2017-12-29: 3 [IU] via SUBCUTANEOUS
  Administered 2017-12-29 – 2017-12-30 (×4): 2 [IU] via SUBCUTANEOUS
  Administered 2017-12-30 – 2017-12-31 (×4): 1 [IU] via SUBCUTANEOUS
  Administered 2018-01-01: 3 [IU] via SUBCUTANEOUS
  Administered 2018-01-01 – 2018-01-02 (×2): 1 [IU] via SUBCUTANEOUS
  Administered 2018-01-02 – 2018-01-03 (×3): 2 [IU] via SUBCUTANEOUS
  Administered 2018-01-03 – 2018-01-04 (×2): 1 [IU] via SUBCUTANEOUS
  Filled 2017-12-28: qty 1

## 2017-12-28 MED ORDER — FUROSEMIDE 10 MG/ML IJ SOLN
40.0000 mg | Freq: Once | INTRAMUSCULAR | Status: AC
  Administered 2017-12-28: 40 mg via INTRAVENOUS
  Filled 2017-12-28: qty 4

## 2017-12-28 MED ORDER — ATORVASTATIN CALCIUM 40 MG PO TABS
40.0000 mg | ORAL_TABLET | Freq: Every day | ORAL | Status: DC
  Administered 2017-12-28 – 2018-01-01 (×5): 40 mg via ORAL
  Filled 2017-12-28: qty 2
  Filled 2017-12-28 (×4): qty 1

## 2017-12-28 MED ORDER — MAGNESIUM OXIDE 400 (241.3 MG) MG PO TABS
400.0000 mg | ORAL_TABLET | Freq: Every day | ORAL | Status: DC
  Administered 2017-12-28 – 2017-12-31 (×4): 400 mg via ORAL
  Filled 2017-12-28 (×4): qty 1

## 2017-12-28 MED ORDER — HYDROCODONE-ACETAMINOPHEN 5-325 MG PO TABS
1.0000 | ORAL_TABLET | Freq: Once | ORAL | Status: AC
  Administered 2017-12-28: 1 via ORAL
  Filled 2017-12-28: qty 1

## 2017-12-28 MED ORDER — OXYCODONE HCL 5 MG PO TABS
5.0000 mg | ORAL_TABLET | ORAL | Status: DC | PRN
  Administered 2017-12-28 – 2017-12-30 (×4): 5 mg via ORAL
  Filled 2017-12-28 (×4): qty 1

## 2017-12-28 MED ORDER — TRAZODONE HCL 50 MG PO TABS: 50.0000 mg | ORAL_TABLET | Freq: Every evening | ORAL | Status: DC | PRN

## 2017-12-28 MED ORDER — ACETAMINOPHEN 325 MG PO TABS
650.0000 mg | ORAL_TABLET | Freq: Four times a day (QID) | ORAL | Status: DC | PRN
  Administered 2017-12-28 – 2017-12-30 (×3): 650 mg via ORAL
  Filled 2017-12-28 (×3): qty 2

## 2017-12-28 MED ORDER — SENNOSIDES-DOCUSATE SODIUM 8.6-50 MG PO TABS
2.0000 | ORAL_TABLET | Freq: Every day | ORAL | Status: DC
  Administered 2017-12-28 – 2018-01-03 (×7): 2 via ORAL
  Filled 2017-12-28 (×7): qty 2

## 2017-12-28 MED ORDER — POTASSIUM CHLORIDE CRYS ER 20 MEQ PO TBCR
40.0000 meq | EXTENDED_RELEASE_TABLET | Freq: Every day | ORAL | Status: DC
  Administered 2017-12-28 – 2018-01-04 (×8): 40 meq via ORAL
  Filled 2017-12-28 (×8): qty 2

## 2017-12-28 MED ORDER — ENSURE ENLIVE PO LIQD
237.0000 mL | Freq: Two times a day (BID) | ORAL | Status: DC
  Administered 2017-12-29 – 2018-01-04 (×11): 237 mL via ORAL

## 2017-12-28 MED ORDER — ONDANSETRON HCL 4 MG PO TABS: 4.0000 mg | ORAL_TABLET | Freq: Four times a day (QID) | ORAL | Status: DC | PRN

## 2017-12-28 MED ORDER — OMEGA-3-ACID ETHYL ESTERS 1 G PO CAPS
2.0000 g | ORAL_CAPSULE | Freq: Two times a day (BID) | ORAL | Status: DC
  Administered 2017-12-28 – 2018-01-04 (×14): 2 g via ORAL
  Filled 2017-12-28 (×14): qty 2

## 2017-12-28 MED ORDER — SODIUM CHLORIDE 0.9% FLUSH: 3.0000 mL | INTRAVENOUS | Status: DC | PRN

## 2017-12-28 MED ORDER — RIVAROXABAN 20 MG PO TABS: 20.0000 mg | ORAL_TABLET | Freq: Every day | ORAL | Status: DC

## 2017-12-28 MED ORDER — SODIUM CHLORIDE 0.9 % IV SOLN
INTRAVENOUS | Status: AC
  Administered 2017-12-28: 22:00:00 via INTRAVENOUS

## 2017-12-28 MED ORDER — INSULIN ASPART 100 UNIT/ML ~~LOC~~ SOLN: 0.0000 [IU] | Freq: Every day | SUBCUTANEOUS | Status: DC

## 2017-12-28 MED ORDER — CLONIDINE HCL 0.1 MG PO TABS
0.1000 mg | ORAL_TABLET | Freq: Two times a day (BID) | ORAL | Status: DC
  Administered 2017-12-28 (×2): 0.1 mg via ORAL
  Filled 2017-12-28 (×3): qty 1

## 2017-12-28 MED ORDER — CARVEDILOL 6.25 MG PO TABS
6.2500 mg | ORAL_TABLET | Freq: Two times a day (BID) | ORAL | Status: DC
  Administered 2017-12-28 – 2018-01-02 (×11): 6.25 mg via ORAL
  Filled 2017-12-28 (×11): qty 1

## 2017-12-28 MED ORDER — SODIUM CHLORIDE 0.9 % IV SOLN
250.0000 mL | INTRAVENOUS | Status: DC | PRN
  Administered 2017-12-30: 250 mL via INTRAVENOUS

## 2017-12-28 MED ORDER — POLYETHYLENE GLYCOL 3350 17 G PO PACK
17.0000 g | PACK | Freq: Every day | ORAL | Status: DC | PRN
  Administered 2018-01-04: 17 g via ORAL
  Filled 2017-12-28 (×2): qty 1

## 2017-12-28 MED ORDER — FUROSEMIDE 40 MG PO TABS
40.0000 mg | ORAL_TABLET | Freq: Every day | ORAL | Status: DC
  Administered 2017-12-28: 40 mg via ORAL
  Filled 2017-12-28: qty 2

## 2017-12-28 MED ORDER — ONDANSETRON HCL 4 MG/2ML IJ SOLN: 4.0000 mg | Freq: Four times a day (QID) | INTRAMUSCULAR | Status: DC | PRN

## 2017-12-28 MED ORDER — ALBUTEROL SULFATE HFA 108 (90 BASE) MCG/ACT IN AERS
2.0000 | INHALATION_SPRAY | Freq: Four times a day (QID) | RESPIRATORY_TRACT | Status: DC | PRN
  Filled 2017-12-28: qty 6.7

## 2017-12-28 MED ORDER — ACETAMINOPHEN 650 MG RE SUPP: 650.0000 mg | Freq: Four times a day (QID) | RECTAL | Status: DC | PRN

## 2017-12-28 NOTE — ED Notes (Signed)
Attempted report x1. 

## 2017-12-28 NOTE — H&P (Signed)
Patient Demographics:    Jay Chen, is a 52 y.o. male  MRN: [id number]   DOB - 1966-02-28  Admit Date - 12/27/2017  Outpatient Primary MD for the patient is [name], MD   Assessment & Plan:    Active Problems:   AKI (acute kidney injury) (Jay Chen)    1)AKI----acute kidney injury on CKD stage - III    creatinine on admission= 2.72 ,   baseline creatinine = 1.3 to 1.8 (within the last 3 months),  Avoid nephrotoxic agents/dehydration/hypotension, hold Entresto, hold metformin, hold [place]  2)DM2- not controlled, last A1c 8.4, metformin and Farxiga on hold due to renal concerns at this time, Use Novolog/Humalog Sliding scale insulin with Accu-Cheks/Fingersticks as ordered   3)HFrEF---  last known EF 20 to 35%, there is clinical radiological evidence of acute on chronic systolic dysfunction CHF exacerbation with pulmonary venous congestion, Entresto discontinued due to renal concerns, treat empirically with Lasix, cardiology consult requested, patient may benefit from evaluation by EP for possible AICD placement, daily weights and fluid input and output monitoring as ordered.  Patient apparently has lost about 50 pounds since May 2019 when he was placed on diuretics  4)Chronic Afib --stable, continue Coreg for rate control, reduce Xarelto to 15 mg daily due to kidney concerns   5)Morbid obesity BMI over 50/OSA/Pulm HTN/Rt sided HF--- obesity complicates overall care, use CPAP nightly, diuretics as ordered for heart failure symptoms    With History of - Reviewed by me  Past Medical History:  Diagnosis Date  . Congestive heart failure [facility]) May of 2011   Felt to have cor pulmonale; EF 45 to 50% from echo in May 2011  . Diabetes mellitus   . Hyperlipidemia   . Hypertension   . Morbid obesity ([place])   . Sleep  apnea   . Thoracic aneurysm    Followed by [name]      Past Surgical History:  Procedure Laterality Date  . CHOLECYSTECTOMY  2000  . US ECHOCARDIOGRAPHY  09/21/2009   EF 45-50%; Cavity size was severely dilated, severe concentric hypertrophy and normal wall motion    Chief Complaint  Patient presents with  . Flank Pain      HPI:    Jay Chen  is a 52 y.o. male with  pmhx relevant for systolic dysfunction CHF with an EF of 20 to 25%, diabetes, hypertension, hyperlipidemia, morbid obesity/OSA/pulmonary hypertension/right-sided heart failure who presents with right-sided flank pain.  Flank pain started acutely within the last 24 hours, again no urinary symptoms, no history of kidney stones, no fevers, no fall or trauma.  no dysuria, no hematuria, no vomiting, no diarrhea, no abdominal pain.  Patient has some shortness of breath but no chest pains.  Patient states that lower extremities are always swollen.   Patient apparently has lost about 50 pounds since May 2019 when he was placed on diuretics  No frank chest pain, no pleuritic symptoms, has  some dyspnea on exertion  In ED stone protocol without acute findings, chest x-ray suggest cardiomegaly and vascular congestion  Further work-up reveals creatinine of 2.72, patient's creatinine was 1.8 amount ago and 1.3  About 3 months ago, patient denies vomiting or diarrhea, however he has been on [place]  CBC is unremarkable, urine analysis is unremarkable, BNP is 283, no baseline available      Review of systems:    In addition to the HPI above,   A full Review of  Systems was done, all other systems reviewed are negative except as noted above in HPI , .    Social History:  Reviewed by me    Social History   Tobacco Use  . Smoking status: Never Smoker  . Smokeless tobacco: Never Used  Substance Use Topics  . Alcohol use: Yes    Alcohol/week: 3.0 oz    Types: 5 Cans of beer per week    Comment:  Rarely     Family History :  Reviewed by me   Family History  Problem Relation Age of Onset  . Hypertension Mother   . Pancreatic cancer Father   . Prostate cancer Father   . Colon cancer Father      Home Medications:   Prior to Admission medications   Medication Sig Start Date End Date Taking? Authorizing Provider  albuterol (PROVENTIL HFA;VENTOLIN HFA) 108 (90 BASE) MCG/ACT inhaler Inhale 2 puffs into the lungs every 6 (six) hours as needed for wheezing or shortness of breath.   Yes [provider]  allopurinol (ZYLOPRIM) 100 MG tablet Take 1 tablet (100 mg total) by mouth daily. 09/02/17  Yes [name], MD  atorvastatin (LIPITOR) 40 MG tablet Take 1 tablet (40 mg total) by mouth daily. 10/14/17  Yes [name], MD  carvedilol (COREG) 6.25 MG tablet Take 1 tablet (6.25 mg total) by mouth 2 (two) times daily. 11/22/17  Yes [name], MD  cloNIDine (CATAPRES) 0.1 MG tablet Take 1 tab in the am and 2 tab every pm. Patient taking differently: Take 0.1-0.2 mg by mouth See admin instructions. Take 1 tablet every morning and take 2 tablets every evening 10/14/17  Yes [name], MD  dapagliflozin propanediol (FARXIGA) 5 MG TABS tablet Take 5 mg by mouth daily. 09/02/17  Yes [name], MD  furosemide (LASIX) 40 MG tablet Take 1 tablet (40 mg total) by mouth daily. 11/22/17 02/20/18 Yes [name], MD  magnesium oxide (MAG-OX) 400 MG tablet Take 1 tablet (400 mg total) by mouth daily. 10/12/16  Yes [name], MD  metFORMIN (GLUCOPHAGE) 1000 MG tablet Take 1 tablet (1,000 mg total) by mouth 2 (two) times daily with a meal. 09/02/17  Yes [name], MD  omega-3 acid ethyl esters (LOVAZA) 1 G capsule Take 2 capsules (2 g total) by mouth 2 (two) times daily. 07/18/13  Yes [name], MD  potassium chloride SA (KLOR-CON M20) 20 MEQ tablet Take 2 tablets (40 mEq total) by mouth daily. 09/02/17  Yes [name], MD  rivaroxaban (XARELTO) 20 MG TABS  tablet Take 1 tablet (20 mg total) by mouth daily with supper. 09/01/17  Yes [name], MD  sacubitril-valsartan (ENTRESTO) 24-26 MG Take 1 tablet by mouth 2 (two) times daily. 11/07/17  Yes Lendon Colonel, NP  Exenatide ER (BYDUREON BCISE) 2 MG/0.85ML AUIJ Inject 1 Act into the skin once a week. Patient not taking: Reported on  12/28/2017 09/02/17   [name], MD  glucose blood (BAYER CONTOUR NEXT TEST) test strip Use BID 09/28/12   [name], MD  Lancets Misc. Patricia Pesa 1) MISC Test up to twice daily 09/28/12   [name], MD     Allergies:    No Known Allergies   Physical Exam:   Vitals  Blood pressure (!) 86/66, pulse 80, temperature 99.5 F (37.5 C), temperature source Oral, resp. rate 20, height 5\' 8"  (1.727 m), weight (!) 163.3 kg (360 lb), SpO2 93 %.  Physical Examination: General appearance - alert, well appearing, and in no distress and  Mental status - alert, oriented to person, place, and time,  Eyes - sclera anicteric Neck - supple, no JVD elevation , Chest -diminished in bases with scattered rales Heart - S1 and S2 normal, irregular Abdomen - soft, nontender, nondistended, increased truncal adiposity Neurological - screening mental status exam normal, neck supple without rigidity, cranial nerves II through XII intact, DTR's normal and symmetric Extremities -2-3+ pitting pedal edema noted, intact peripheral pulses  Skin - warm, dry     Data Review:    CBC Recent Labs  Lab 12/27/17 2354  WBC 9.8  HGB 13.9  HCT 41.9  PLT 242  MCV 94.4  MCH 31.3  MCHC 33.2  RDW 13.6   ------------------------------------------------------------------------------------------------------------------  Chemistries  Recent Labs  Lab 12/27/17 2354  NA 137  K 3.3*  CL 91*  CO2 30  GLUCOSE 174*  BUN 56*  CREATININE 2.72*  CALCIUM 9.2    ------------------------------------------------------------------------------------------------------------------ estimated creatinine clearance is 47.3 mL/min (A) (by C-G formula based on SCr of 2.72 mg/dL (H)). ------------------------------------------------------------------------------------------------------------------ No results for input(s): TSH, T4TOTAL, T3FREE, THYROIDAB in the last 72 hours.  Invalid input(s): FREET3   Coagulation profile No results for input(s): INR, PROTIME in the last 168 hours. ------------------------------------------------------------------------------------------------------------------- No results for input(s): DDIMER in the last 72 hours. -------------------------------------------------------------------------------------------------------------------  Cardiac Enzymes No results for input(s): CKMB, TROPONINI, MYOGLOBIN in the last 168 hours.  Invalid input(s): CK ------------------------------------------------------------------------------------------------------------------    Component Value Date/Time   BNP 283.3 (H) 12/27/2017 2354     ---------------------------------------------------------------------------------------------------------------  Urinalysis    Component Value Date/Time   COLORURINE YELLOW 12/27/2017 2354   APPEARANCEUR HAZY (A) 12/27/2017 2354   LABSPEC 1.018 12/27/2017 2354   PHURINE 5.0 12/27/2017 2354   GLUCOSEU NEGATIVE 12/27/2017 2354   GLUCOSEU NEGATIVE 09/01/2017 1357   HGBUR NEGATIVE 12/27/2017 2354   BILIRUBINUR NEGATIVE 12/27/2017 2354   KETONESUR NEGATIVE 12/27/2017 2354   PROTEINUR NEGATIVE 12/27/2017 2354   UROBILINOGEN 1.0 09/01/2017 1357   NITRITE NEGATIVE 12/27/2017 2354   LEUKOCYTESUR NEGATIVE 12/27/2017 2354    ----------------------------------------------------------------------------------------------------------------   Imaging Results:    Dg Chest 2 View  Result Date:  12/28/2017 CLINICAL DATA:  Acute onset of generalized abdominal pain and hypoxia. EXAM: CHEST - 2 VIEW COMPARISON:  CTA of the chest performed 03/28/2012 FINDINGS: The lungs are well-aerated. Mild vascular congestion is noted. Bibasilar airspace opacities may reflect mild interstitial edema. There is no evidence of significant pleural effusion or pneumothorax. The heart is borderline enlarged. No acute osseous abnormalities are seen. IMPRESSION: Mild vascular congestion and borderline cardiomegaly. Bibasilar airspace opacities may reflect mild interstitial edema. Electronically Signed   By: Garald Balding M.D.   On: 12/28/2017 05:34   Ct Renal Stone Study  Result Date: 12/28/2017 CLINICAL DATA:  Acute onset of right flank pain. EXAM: CT ABDOMEN AND PELVIS WITHOUT CONTRAST TECHNIQUE: Multidetector CT imaging of the abdomen  and pelvis was performed following the standard protocol without IV contrast. COMPARISON:  CT of the abdomen and pelvis performed 04/07/2008 FINDINGS: Lower chest: Mild bibasilar atelectasis or scarring is noted. The heart is mildly enlarged. Trace pericardial fluid remains within normal limits. Hepatobiliary: The liver is unremarkable in appearance. The patient is status post cholecystectomy, with clips noted at the gallbladder fossa. The common bile duct remains normal in caliber. Pancreas: The pancreas is within normal limits. Spleen: The spleen is unremarkable in appearance. Adrenals/Urinary Tract: A poorly characterized left adrenal adenoma is noted, measuring perhaps 1.9 cm. The right adrenal gland is unremarkable. A 2 mm nonobstructing stone is noted at the lower pole of the right kidney. There is no evidence of hydronephrosis. No obstructing ureteral stones are identified. Nonspecific perinephric stranding is noted bilaterally. Stomach/Bowel: The stomach is unremarkable in appearance. The small bowel is within normal limits. The appendix is normal in caliber, without evidence of  appendicitis. The colon is unremarkable in appearance. Vascular/Lymphatic: The abdominal aorta is unremarkable in appearance. The inferior vena cava is grossly unremarkable. No retroperitoneal lymphadenopathy is seen. No pelvic sidewall lymphadenopathy is identified. Reproductive: The bladder is mildly distended and grossly unremarkable. The prostate is enlarged, measuring 5.3 cm in transverse dimension, with mild calcification. Other: No additional soft tissue abnormalities are seen. Musculoskeletal: No acute osseous abnormalities are identified. The visualized musculature is unremarkable in appearance. IMPRESSION: 1. No acute abnormality seen to explain the patient's symptoms. 2. 2 mm nonobstructing stone at the lower pole of the right kidney. 3. Mild bibasilar atelectasis or scarring noted. 4. Mild cardiomegaly. 5. Left adrenal adenoma incidentally noted. Electronically Signed   By: Garald Balding M.D.   On: 12/28/2017 05:51    Radiological Exams on Admission: Dg Chest 2 View  Result Date: 12/28/2017 CLINICAL DATA:  Acute onset of generalized abdominal pain and hypoxia. EXAM: CHEST - 2 VIEW COMPARISON:  CTA of the chest performed 03/28/2012 FINDINGS: The lungs are well-aerated. Mild vascular congestion is noted. Bibasilar airspace opacities may reflect mild interstitial edema. There is no evidence of significant pleural effusion or pneumothorax. The heart is borderline enlarged. No acute osseous abnormalities are seen. IMPRESSION: Mild vascular congestion and borderline cardiomegaly. Bibasilar airspace opacities may reflect mild interstitial edema. Electronically Signed   By: Garald Balding M.D.   On: 12/28/2017 05:34   Ct Renal Stone Study  Result Date: 12/28/2017 CLINICAL DATA:  Acute onset of right flank pain. EXAM: CT ABDOMEN AND PELVIS WITHOUT CONTRAST TECHNIQUE: Multidetector CT imaging of the abdomen and pelvis was performed following the standard protocol without IV contrast. COMPARISON:  CT of  the abdomen and pelvis performed 04/07/2008 FINDINGS: Lower chest: Mild bibasilar atelectasis or scarring is noted. The heart is mildly enlarged. Trace pericardial fluid remains within normal limits. Hepatobiliary: The liver is unremarkable in appearance. The patient is status post cholecystectomy, with clips noted at the gallbladder fossa. The common bile duct remains normal in caliber. Pancreas: The pancreas is within normal limits. Spleen: The spleen is unremarkable in appearance. Adrenals/Urinary Tract: A poorly characterized left adrenal adenoma is noted, measuring perhaps 1.9 cm. The right adrenal gland is unremarkable. A 2 mm nonobstructing stone is noted at the lower pole of the right kidney. There is no evidence of hydronephrosis. No obstructing ureteral stones are identified. Nonspecific perinephric stranding is noted bilaterally. Stomach/Bowel: The stomach is unremarkable in appearance. The small bowel is within normal limits. The appendix is normal in caliber, without evidence of appendicitis. The colon is unremarkable  in appearance. Vascular/Lymphatic: The abdominal aorta is unremarkable in appearance. The inferior vena cava is grossly unremarkable. No retroperitoneal lymphadenopathy is seen. No pelvic sidewall lymphadenopathy is identified. Reproductive: The bladder is mildly distended and grossly unremarkable. The prostate is enlarged, measuring 5.3 cm in transverse dimension, with mild calcification. Other: No additional soft tissue abnormalities are seen. Musculoskeletal: No acute osseous abnormalities are identified. The visualized musculature is unremarkable in appearance. IMPRESSION: 1. No acute abnormality seen to explain the patient's symptoms. 2. 2 mm nonobstructing stone at the lower pole of the right kidney. 3. Mild bibasilar atelectasis or scarring noted. 4. Mild cardiomegaly. 5. Left adrenal adenoma incidentally noted. Electronically Signed   By: Garald Balding M.D.   On: 12/28/2017 05:51     DVT Prophylaxis -SCD /Xarelto  AM Labs Ordered, also please review Full Orders  Family Communication: Admission, patients condition and plan of care including tests being ordered have been discussed with the patient who indicate understanding and agree with the plan   Code Status - Full Code  Likely DC to  home  Condition   stable  Roxan Hockey M.D on 12/28/2017 at 2:27 PM  Go to [email or web address] - password [email or web address] for contact info  [facility] - Office  [phone]

## 2017-12-28 NOTE — Consult Note (Addendum)
Cardiology Consultation:   Patient ID: Jay Chen; [id number]; 02-23-1966   Admit date: 12/27/2017 Date of Consult: 12/28/2017  Primary Care Provider: [name], MD Primary Cardiologist: Quay Burow, MD  Chief Complaint: Right flank pain  Patient Profile:   Jay Chen is a 52 y.o. male with a hx of morbid obesity, OSA intolerant of CPAP, cor pulmonale, DM, HTN, HLD, 4.8cm thoracic aortic aneurysm by CT 2013 who is being seen today for the evaluation of CHF med adjustment at the request of Dr. Denton Brick.  History of Present Illness:   [name] was remotely seen by Dr. Stanford Breed in 2013 for hypertension and cor pulmonale.  Echo in 2011 had shown EF of 45 to 50%.  That appears to be the last year that he followed up with cardiothoracic surgery for ongoing surveillance of his thoracic aneurysm.  He was lost to cardiology follow-up until reestablishing in May 2019 with Dr. Alvester Chou for evaluation of new onset persistent atrial fibrillation.  He had been started on Xarelto, and also appeared to be in active heart failure.  His weight was 412 pounds at that initial visit.  He was started on Lasix.  There was no specific plan outlined to restore normal sinus rhythm.  2D echo 10/31/2017 was technically limited due to poor sound wave transition transmission but appeared to show severe biventricular dysfunction with EF of 20 to 25%, severely dilated left atrium, dilated RV with moderately reduced RV function and dilated right atrium.  He saw Jory Sims back in follow-up November 07, 2017 and was started on Entresto in place of losartan/amlodipine/HCTZ.  His Lasix was also increased to 60 mg daily.  The possibility of cardiac catheterization was entertained, but deferred until further stabilization of his volume status.  His creatinine had run 1.39 in April 2019, and then when rechecked December 18, 2017 it was 1.80 with a low potassium level of 3.4.  It appears the office had remarkable  difficulty trying to contact the patient with instructions on adjustment of his potassium dose.  He saw Dr. Gwenlyn Found back in follow-up July 2 and was noted to have had approximately a 50 pound diuresis.  A referral was placed to the heart failure clinic, but again there appeared to be several phone notes where there was difficulty contacting the patient for this.  He did inform me he knows he has an appointment scheduled for August 12.  Given the substantial diuresis, Lasix was decreased to 40 mg daily.  Metoprolol was stopped and carvedilol was started. HR was 114 at that visit.  In the interim, he states he is felt relatively fine from a cardiac standpoint.  He feels his breathing is completely at baseline and denies any orthopnea or chest pain.  He has no true awareness of his atrial fibrillation.  He presented to the emergency room yesterday with acute onset of right flank pain in the absence of any known trauma.  He could feel a sensation of right lower quadrant stabbing pain when walking or shifting about.  He has not had any fevers or chills or nausea or vomiting.  Admitting lab work shows an elevated BUN of 56 and creatinine of 2.72, hypokalemia with potassium of 3.3, BNP of 283, normal CBC.  Chest x-ray suggested mild vascular congestion and borderline cardiomegaly with bibasilar airspace opacities which may reflect mild interstitial edema.  He had a CT renal stone study that did not explain his pain, only noting a 2 mm nonobstructing  stone at the lower pole of the right kidney and a left adrenal adenoma incidentally noted.  This mentioned mild bibasilar atelectasis or scarring but in general no significant fluid was noted in the lower chest.  He continues to note intermittent flank pain and is requesting pain medication.   Past Medical History:  Diagnosis Date  . CKD (chronic kidney disease), stage III ([place])   . Congestive heart failure [facility]) May of 2011   Felt to have cor pulmonale; EF 45 to 50%  from echo in May 2011  . Cor pulmonale (chronic) ([place])   . Diabetes mellitus   . Hyperlipidemia   . Hypertension   . Morbid obesity ([place])   . Sleep apnea   . Thoracic aneurysm    a. 4.8cm thoracic aortic aneurysm by CT 2013.    Past Surgical History:  Procedure Laterality Date  . CHOLECYSTECTOMY  2000  . US ECHOCARDIOGRAPHY  09/21/2009   EF 45-50%; Cavity size was severely dilated, severe concentric hypertrophy and normal wall motion     Inpatient Medications: Scheduled Meds: . atorvastatin  40 mg Oral Daily  . carvedilol  6.25 mg Oral BID  . cloNIDine  0.1 mg Oral BID  . insulin aspart  0-5 Units Subcutaneous QHS  . insulin aspart  0-9 Units Subcutaneous TID WC  . magnesium oxide  400 mg Oral Daily  . omega-3 acid ethyl esters  2 g Oral BID  . potassium chloride SA  40 mEq Oral Daily  . rivaroxaban  15 mg Oral Q supper  . sodium chloride flush  3 mL Intravenous Q12H   Continuous Infusions: . sodium chloride     PRN Meds: sodium chloride, acetaminophen **OR** acetaminophen, albuterol, albuterol, ondansetron **OR** ondansetron (ZOFRAN) IV, polyethylene glycol, sodium chloride flush, traZODone  Home Meds: Prior to Admission medications   Medication Sig Start Date End Date Taking? Authorizing Provider  albuterol (PROVENTIL HFA;VENTOLIN HFA) 108 (90 BASE) MCG/ACT inhaler Inhale 2 puffs into the lungs every 6 (six) hours as needed for wheezing or shortness of breath.   Yes [provider]  allopurinol (ZYLOPRIM) 100 MG tablet Take 1 tablet (100 mg total) by mouth daily. 09/02/17  Yes [name], MD  atorvastatin (LIPITOR) 40 MG tablet Take 1 tablet (40 mg total) by mouth daily. 10/14/17  Yes [name], MD  carvedilol (COREG) 6.25 MG tablet Take 1 tablet (6.25 mg total) by mouth 2 (two) times daily. 11/22/17  Yes [name], MD  cloNIDine (CATAPRES) 0.1 MG tablet Take 1 tab in the am and 2 tab every pm. Patient taking differently: Take 0.1-0.2 mg by mouth  See admin instructions. Take 1 tablet every morning and take 2 tablets every evening 10/14/17  Yes [name], MD  dapagliflozin propanediol (FARXIGA) 5 MG TABS tablet Take 5 mg by mouth daily. 09/02/17  Yes [name], MD  furosemide (LASIX) 40 MG tablet Take 1 tablet (40 mg total) by mouth daily. 11/22/17 02/20/18 Yes [name], MD  magnesium oxide (MAG-OX) 400 MG tablet Take 1 tablet (400 mg total) by mouth daily. 10/12/16  Yes [name], MD  metFORMIN (GLUCOPHAGE) 1000 MG tablet Take 1 tablet (1,000 mg total) by mouth 2 (two) times daily with a meal. 09/02/17  Yes [name], MD  omega-3 acid ethyl esters (LOVAZA) 1 G capsule Take 2 capsules (2 g total) by mouth 2 (two) times daily. 07/18/13  Yes [name], MD  potassium chloride SA (  KLOR-CON M20) 20 MEQ tablet Take 2 tablets (40 mEq total) by mouth daily. 09/02/17  Yes [name], MD  rivaroxaban (XARELTO) 20 MG TABS tablet Take 1 tablet (20 mg total) by mouth daily with supper. 09/01/17  Yes [name], MD  sacubitril-valsartan (ENTRESTO) 24-26 MG Take 1 tablet by mouth 2 (two) times daily. 11/07/17  Yes Lendon Colonel, NP  Exenatide ER (BYDUREON BCISE) 2 MG/0.85ML AUIJ Inject 1 Act into the skin once a week. Patient not taking: Reported on 12/28/2017 09/02/17   [name], MD  glucose blood [facility] CONTOUR NEXT TEST) test strip Use BID 09/28/12   [name], MD  Lancets Misc. Patricia Pesa 1) MISC Test up to twice daily 09/28/12   [name], MD    Allergies:   No Known Allergies  Social History:   Social History   Socioeconomic History  . Marital status: Married    Spouse name: Not on file  . Number of children: Not on file  . Years of education: Not on file  . Highest education level: Not on file  Occupational History  . Not on file  Social Needs  . Financial resource strain: Not on file  . Food insecurity:    Worry: Not on file    Inability: Not on file  . Transportation needs:     Medical: Not on file    Non-medical: Not on file  Tobacco Use  . Smoking status: Never Smoker  . Smokeless tobacco: Never Used  Substance and Sexual Activity  . Alcohol use: Yes    Alcohol/week: 3.0 oz    Types: 5 Cans of beer per week    Comment: Rarely  . Drug use: No  . Sexual activity: Not Currently  Lifestyle  . Physical activity:    Days per week: Not on file    Minutes per session: Not on file  . Stress: Not on file  Relationships  . Social connections:    Talks on phone: Not on file    Gets together: Not on file    Attends religious service: Not on file    Active member of club or organization: Not on file    Attends meetings of clubs or organizations: Not on file    Relationship status: Not on file  . Intimate partner violence:    Fear of current or ex partner: Not on file    Emotionally abused: Not on file    Physically abused: Not on file    Forced sexual activity: Not on file  Other Topics Concern  . Not on file  Social History Narrative  . Not on file    Family History:   The patient's family history includes Colon cancer in his father; Hypertension in his mother; Pancreatic cancer in his father; Prostate cancer in his father.  ROS:  Please see the history of present illness.  All other ROS reviewed and negative.     Physical Exam/Data:   Vitals:   12/28/17 1400 12/28/17 1430 12/28/17 1500 12/28/17 1600  BP: (!) 86/66 94/77 101/83 104/68  Pulse: 80 85 90 (!) 107  Resp:  16 19 18   Temp:      TempSrc:      SpO2:  90% 91% 93%  Weight:      Height:        Intake/Output Summary (Last 24 hours) at 12/28/2017 1617 Last data filed at 12/28/2017 1614 Gross per 24 hour  Intake -  Output  325 ml  Net -325 ml   Filed Weights   12/27/17 2349  Weight: (!) 360 lb (163.3 kg)   Body mass index is 54.74 kg/m.  General: Well developed, well nourished morbidly obese M, in no acute distress. Head: Normocephalic, atraumatic, sclera non-icteric, no xanthomas,  nares are without discharge. Neck: Negative for carotid bruits. JVD not elevated. Lungs: Clear bilaterally to auscultation without wheezes, rales, or rhonchi. Breathing is unlabored. Heart: RRR with S1 S2. No murmurs, rubs, or gallops appreciated. Abdomen: Soft, non-tender, non-distended with normoactive bowel sounds. No hepatomegaly. No rebound/guarding. No obvious abdominal masses. Msk:  Strength and tone appear normal for age. Extremities: No clubbing or cyanosis. Chronic appearing lymphedema bilaterally.  Distal pedal pulses are 2+ and equal bilaterally. Neuro: Alert and oriented X 3. No facial asymmetry. No focal deficit. Moves all extremities spontaneously. Psych:  Responds to questions appropriately with a normal affect.  EKG:  The EKG was personally reviewed and demonstrates atrial fib 125bpm nonspecific ST-T changes  Relevant CV Studies: As above  Laboratory Data:  Chemistry Recent Labs  Lab 12/27/17 2354  NA 137  K 3.3*  CL 91*  CO2 30  GLUCOSE 174*  BUN 56*  CREATININE 2.72*  CALCIUM 9.2  GFRNONAA 25*  GFRAA 29*  ANIONGAP 16*    No results for input(s): PROT, ALBUMIN, AST, ALT, ALKPHOS, BILITOT in the last 168 hours. Hematology Recent Labs  Lab 12/27/17 2354  WBC 9.8  RBC 4.44  HGB 13.9  HCT 41.9  MCV 94.4  MCH 31.3  MCHC 33.2  RDW 13.6  PLT 242   Cardiac EnzymesNo results for input(s): TROPONINI in the last 168 hours. No results for input(s): TROPIPOC in the last 168 hours.  BNP Recent Labs  Lab 12/27/17 2354  BNP 283.3*    DDimer No results for input(s): DDIMER in the last 168 hours.  Radiology/Studies:  Dg Chest 2 View  Result Date: 12/28/2017 CLINICAL DATA:  Acute onset of generalized abdominal pain and hypoxia. EXAM: CHEST - 2 VIEW COMPARISON:  CTA of the chest performed 03/28/2012 FINDINGS: The lungs are well-aerated. Mild vascular congestion is noted. Bibasilar airspace opacities may reflect mild interstitial edema. There is no evidence of  significant pleural effusion or pneumothorax. The heart is borderline enlarged. No acute osseous abnormalities are seen. IMPRESSION: Mild vascular congestion and borderline cardiomegaly. Bibasilar airspace opacities may reflect mild interstitial edema. Electronically Signed   By: Garald Balding M.D.   On: 12/28/2017 05:34   Ct Renal Stone Study  Result Date: 12/28/2017 CLINICAL DATA:  Acute onset of right flank pain. EXAM: CT ABDOMEN AND PELVIS WITHOUT CONTRAST TECHNIQUE: Multidetector CT imaging of the abdomen and pelvis was performed following the standard protocol without IV contrast. COMPARISON:  CT of the abdomen and pelvis performed 04/07/2008 FINDINGS: Lower chest: Mild bibasilar atelectasis or scarring is noted. The heart is mildly enlarged. Trace pericardial fluid remains within normal limits. Hepatobiliary: The liver is unremarkable in appearance. The patient is status post cholecystectomy, with clips noted at the gallbladder fossa. The common bile duct remains normal in caliber. Pancreas: The pancreas is within normal limits. Spleen: The spleen is unremarkable in appearance. Adrenals/Urinary Tract: A poorly characterized left adrenal adenoma is noted, measuring perhaps 1.9 cm. The right adrenal gland is unremarkable. A 2 mm nonobstructing stone is noted at the lower pole of the right kidney. There is no evidence of hydronephrosis. No obstructing ureteral stones are identified. Nonspecific perinephric stranding is noted bilaterally. Stomach/Bowel: The stomach is  unremarkable in appearance. The small bowel is within normal limits. The appendix is normal in caliber, without evidence of appendicitis. The colon is unremarkable in appearance. Vascular/Lymphatic: The abdominal aorta is unremarkable in appearance. The inferior vena cava is grossly unremarkable. No retroperitoneal lymphadenopathy is seen. No pelvic sidewall lymphadenopathy is identified. Reproductive: The bladder is mildly distended and grossly  unremarkable. The prostate is enlarged, measuring 5.3 cm in transverse dimension, with mild calcification. Other: No additional soft tissue abnormalities are seen. Musculoskeletal: No acute osseous abnormalities are identified. The visualized musculature is unremarkable in appearance. IMPRESSION: 1. No acute abnormality seen to explain the patient's symptoms. 2. 2 mm nonobstructing stone at the lower pole of the right kidney. 3. Mild bibasilar atelectasis or scarring noted. 4. Mild cardiomegaly. 5. Left adrenal adenoma incidentally noted. Electronically Signed   By: Garald Balding M.D.   On: 12/28/2017 05:51    Assessment and Plan:   1. Right flank pain - etiology unclear, further evaluation per internal medicine. Temp is mildly elevated but nondiagnostic at 99.5.  2. Acute kidney injury superimposed on suspected CKD stage III - see below. Consider renal US.  3. 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  His BNP is mildly elevated but this can also be seen in the setting of renal insufficiency.  He is down 3 pounds from last office visit and reports no new cardiac symptoms, currently feeling better overall compared to earlier this year.  Chest x-ray suggested some edema, but the lower chest findings from his CT scan only showed atelectasis and scarring.  The patient has not been weighed yet since admission, we will write to do so.  Would also follow his ins and outs. Will hold further Lasix and give back 500 cc of fluid over the course of 10 hours.  Would hold Entresto for now.  He is currently on potassium supplementation as his potassium level is low, but this should be reevaluated on a daily basis while his diuretic is held.  The idea of cardiac catheterization will need to be deferred until his renal function settles out.  He has no acute  symptoms from a cardiac standpoint this admission.  4. Persistent atrial fibrillation - rate is presently improved in the 80s to 90s, would continue current carvedilol and consider titration as we are able to.  We may be able to do so in the form of down titrating his clonidine and uptitrating his carvedilol given his LV dysfunction.  It does not appear there have been any plans to restore sinus rhythm.  Given his severe morbid obesity and untreated sleep apnea as well as severely dilated left atrium, he would be unlikely to hold sinus without antiarrhythmic therapy.  Xarelto dose has been adjusted for acute renal insufficiency.  5. Morbid obesity, with probable OHS (and OSA unable to tolerate CPAP) - long term weight loss will be essential for his health. He is requiring nasal cannula here in the hospital. Old pulm notes from 2017 indicate O2 sat of 90% on RA.  6. Known thoracic aortic aneurysm - long overdue for follow-up. Given AKI cannot do CTA right now to reassess. UE pulses are equal bilaterally and no murmurs suggestive of AI. Per d/w Dr. Radford Pax, no acute assessment needed at this time.  For questions or updates, please contact [place] Please consult [email or web address] for contact info under Cardiology/STEMI.  Signed, Charlie Pitter, PA-C  12/28/2017 4:17 PM

## 2017-12-28 NOTE — ED Provider Notes (Signed)
[facility] EMERGENCY DEPARTMENT Provider Note   CSN: [id number] Arrival date & time: 12/27/17  2329     History   Chief Complaint Chief Complaint  Patient presents with  . Flank Pain    HPI CLAY SOLUM is a 52 y.o. male.  HPI  This is a 52 year old male with history of congestive heart failure with an EF of 20 to 25%, diabetes, hypertension, hyperlipidemia who presents with right-sided flank pain.  Onset of symptoms yesterday.  Reports acute onset of right-sided flank pain.  Worse with movement.  Denies any injury or heavy lifting.  Denies any urinary symptoms including hematuria or dysuria.  No fevers, no chest pain, no shortness of breath.  No history of kidney stones.  Denies any nausea, vomiting, diarrhea, constipation.  Past Medical History:  Diagnosis Date  . Congestive heart failure [facility]) May of 2011   Felt to have cor pulmonale; EF 45 to 50% from echo in May 2011  . Diabetes mellitus   . Hyperlipidemia   . Hypertension   . Morbid obesity ([place])   . Sleep apnea   . Thoracic aneurysm    Followed by [name]    Patient Active Problem List   Diagnosis Date Noted  . Chronic systolic heart failure ([place]) 11/22/2017  . Bilateral lower extremity edema 10/21/2017  . Pure hyperglyceridemia 09/01/2017  . Persistent atrial fibrillation ([place]) 09/01/2017  . Phimosis/redundant prepuce 06/03/2015  . Hypokalemia 07/18/2013  . Gout of big toe 09/11/2012  . Hyperlipidemia with target LDL less than 100 09/11/2012  . Diabetes mellitus type 2, controlled, with complications ([place]) 16/38/4665  . Routine general medical examination at a health care facility 01/19/2012  . Cor pulmonale ([place]) 01/08/2011  . HTN (hypertension) 01/08/2011  . Obesities, morbid ([place]) 01/08/2011  . Aneurysm of thoracic aorta ([place]) 01/08/2011  . Obstructive sleep apnea 08/27/2007  . RHINITIS 08/18/2007    Past Surgical History:  Procedure Laterality Date  . CHOLECYSTECTOMY   2000  . US ECHOCARDIOGRAPHY  09/21/2009   EF 45-50%; Cavity size was severely dilated, severe concentric hypertrophy and normal wall motion        Home Medications    Prior to Admission medications   Medication Sig Start Date End Date Taking? Authorizing Provider  albuterol (PROVENTIL HFA;VENTOLIN HFA) 108 (90 BASE) MCG/ACT inhaler Inhale 2 puffs into the lungs every 6 (six) hours as needed for wheezing or shortness of breath.   Yes [provider]  allopurinol (ZYLOPRIM) 100 MG tablet Take 1 tablet (100 mg total) by mouth daily. 09/02/17  Yes [name], MD  atorvastatin (LIPITOR) 40 MG tablet Take 1 tablet (40 mg total) by mouth daily. 10/14/17  Yes [name], MD  carvedilol (COREG) 6.25 MG tablet Take 1 tablet (6.25 mg total) by mouth 2 (two) times daily. 11/22/17  Yes [name], MD  cloNIDine (CATAPRES) 0.1 MG tablet Take 1 tab in the am and 2 tab every pm. Patient taking differently: Take 0.1-0.2 mg by mouth See admin instructions. Take 1 tablet every morning and take 2 tablets every evening 10/14/17  Yes [name], MD  dapagliflozin propanediol (FARXIGA) 5 MG TABS tablet Take 5 mg by mouth daily. 09/02/17  Yes [name], MD  furosemide (LASIX) 40 MG tablet Take 1 tablet (40 mg total) by mouth daily. 11/22/17 02/20/18 Yes [name], MD  magnesium oxide (MAG-OX) 400 MG tablet Take 1 tablet (400 mg total) by mouth daily. 10/12/16  Yes [name], MD  metFORMIN (GLUCOPHAGE) 1000 MG tablet Take 1 tablet (1,000 mg total) by mouth 2 (two) times daily with a meal. 09/02/17  Yes [name], MD  omega-3 acid ethyl esters (LOVAZA) 1 G capsule Take 2 capsules (2 g total) by mouth 2 (two) times daily. 07/18/13  Yes [name], MD  potassium chloride SA (KLOR-CON M20) 20 MEQ tablet Take 2 tablets (40 mEq total) by mouth daily. 09/02/17  Yes [name], MD  rivaroxaban (XARELTO) 20 MG TABS tablet Take 1 tablet (20 mg total) by mouth daily with  supper. 09/01/17  Yes [name], MD  sacubitril-valsartan (ENTRESTO) 24-26 MG Take 1 tablet by mouth 2 (two) times daily. 11/07/17  Yes Lendon Colonel, NP  Exenatide ER (BYDUREON BCISE) 2 MG/0.85ML AUIJ Inject 1 Act into the skin once a week. Patient not taking: Reported on 12/28/2017 09/02/17   [name], MD  glucose blood [facility] CONTOUR NEXT TEST) test strip Use BID 09/28/12   [name], MD  Lancets Misc. Patricia Pesa 1) MISC Test up to twice daily 09/28/12   [name], MD    Family History Family History  Problem Relation Age of Onset  . Hypertension Mother   . Pancreatic cancer Father   . Prostate cancer Father   . Colon cancer Father     Social History Social History   Tobacco Use  . Smoking status: Never Smoker  . Smokeless tobacco: Never Used  Substance Use Topics  . Alcohol use: Yes    Alcohol/week: 3.0 oz    Types: 5 Cans of beer per week    Comment: Rarely  . Drug use: No     Allergies   Patient has no known allergies.   Review of Systems Review of Systems  Constitutional: Negative for fever.  Respiratory: Negative for shortness of breath.   Cardiovascular: Negative for chest pain.  Gastrointestinal: Negative for abdominal pain, constipation, diarrhea, nausea and vomiting.  Genitourinary: Positive for flank pain.  Musculoskeletal: Negative for back pain.  Skin: Negative for rash.  Neurological: Negative for weakness and numbness.  All other systems reviewed and are negative.    Physical Exam Updated Vital Signs BP 100/69   Pulse 85   Temp 99.5 F (37.5 C) (Oral)   Resp 18   Ht 5\' 8"  (1.727 m)   Wt (!) 163.3 kg (360 lb)   SpO2 92%   BMI 54.74 kg/m   Physical Exam  Constitutional: He is oriented to person, place, and time. He appears well-developed and well-nourished.  Obese, nontoxic-appearing  HENT:  Head: Normocephalic and atraumatic.  Eyes: Pupils are equal, round, and reactive to light.  Neck: Neck supple.    Cardiovascular: Normal rate, regular rhythm and normal heart sounds.  No murmur heard. Pulmonary/Chest: Effort normal and breath sounds normal. No respiratory distress. He has no wheezes.  Abdominal: Soft. Bowel sounds are normal. He exhibits no mass. There is no tenderness. There is no rebound.  Musculoskeletal: He exhibits edema.  2+ pitting edema bilaterally  Neurological: He is alert and oriented to person, place, and time.  Skin: Skin is warm and dry.  Psychiatric: He has a normal mood and affect.  Nursing note and vitals reviewed.    ED Treatments / Results  Labs (all labs ordered are listed, but only abnormal results are displayed) Labs Reviewed  URINALYSIS, ROUTINE W REFLEX MICROSCOPIC - Abnormal; Notable for the following components:  Result Value   APPearance HAZY (*)    All other components within normal limits  BASIC METABOLIC PANEL - Abnormal; Notable for the following components:   Potassium 3.3 (*)    Chloride 91 (*)    Glucose, Bld 174 (*)    BUN 56 (*)    Creatinine, Ser 2.72 (*)    GFR calc non Af Amer 25 (*)    GFR calc Af Amer 29 (*)    Anion gap 16 (*)    All other components within normal limits  CBC    EKG EKG Interpretation  Date/Time:  Tuesday December 27 2017 23:38:16 EDT Ventricular Rate:  125 PR Interval:    QRS Duration: 112 QT Interval:  378 QTC Calculation: 545 R Axis:   -170 Text Interpretation:  Atrial fibrillation with rapid ventricular response Right superior axis deviation Pulmonary disease pattern Incomplete right bundle branch block Nonspecific ST abnormality , probably digitalis effect Abnormal ECG Confirmed by Thayer Jew [phone]) on 12/28/2017 4:43:58 AM   Radiology Dg Chest 2 View  Result Date: 12/28/2017 CLINICAL DATA:  Acute onset of generalized abdominal pain and hypoxia. EXAM: CHEST - 2 VIEW COMPARISON:  CTA of the chest performed 03/28/2012 FINDINGS: The lungs are well-aerated. Mild vascular congestion is noted.  Bibasilar airspace opacities may reflect mild interstitial edema. There is no evidence of significant pleural effusion or pneumothorax. The heart is borderline enlarged. No acute osseous abnormalities are seen. IMPRESSION: Mild vascular congestion and borderline cardiomegaly. Bibasilar airspace opacities may reflect mild interstitial edema. Electronically Signed   By: Garald Balding M.D.   On: 12/28/2017 05:34   Ct Renal Stone Study  Result Date: 12/28/2017 CLINICAL DATA:  Acute onset of right flank pain. EXAM: CT ABDOMEN AND PELVIS WITHOUT CONTRAST TECHNIQUE: Multidetector CT imaging of the abdomen and pelvis was performed following the standard protocol without IV contrast. COMPARISON:  CT of the abdomen and pelvis performed 04/07/2008 FINDINGS: Lower chest: Mild bibasilar atelectasis or scarring is noted. The heart is mildly enlarged. Trace pericardial fluid remains within normal limits. Hepatobiliary: The liver is unremarkable in appearance. The patient is status post cholecystectomy, with clips noted at the gallbladder fossa. The common bile duct remains normal in caliber. Pancreas: The pancreas is within normal limits. Spleen: The spleen is unremarkable in appearance. Adrenals/Urinary Tract: A poorly characterized left adrenal adenoma is noted, measuring perhaps 1.9 cm. The right adrenal gland is unremarkable. A 2 mm nonobstructing stone is noted at the lower pole of the right kidney. There is no evidence of hydronephrosis. No obstructing ureteral stones are identified. Nonspecific perinephric stranding is noted bilaterally. Stomach/Bowel: The stomach is unremarkable in appearance. The small bowel is within normal limits. The appendix is normal in caliber, without evidence of appendicitis. The colon is unremarkable in appearance. Vascular/Lymphatic: The abdominal aorta is unremarkable in appearance. The inferior vena cava is grossly unremarkable. No retroperitoneal lymphadenopathy is seen. No pelvic  sidewall lymphadenopathy is identified. Reproductive: The bladder is mildly distended and grossly unremarkable. The prostate is enlarged, measuring 5.3 cm in transverse dimension, with mild calcification. Other: No additional soft tissue abnormalities are seen. Musculoskeletal: No acute osseous abnormalities are identified. The visualized musculature is unremarkable in appearance. IMPRESSION: 1. No acute abnormality seen to explain the patient's symptoms. 2. 2 mm nonobstructing stone at the lower pole of the right kidney. 3. Mild bibasilar atelectasis or scarring noted. 4. Mild cardiomegaly. 5. Left adrenal adenoma incidentally noted. Electronically Signed   By: Francoise Schaumann.D.  On: 12/28/2017 05:51    Procedures Procedures (including critical care time)  Medications Ordered in ED Medications  HYDROcodone-acetaminophen (NORCO/VICODIN) 5-325 MG per tablet 1 tablet (1 tablet Oral Given 12/28/17 0544)  furosemide (LASIX) injection 40 mg (40 mg Intravenous Given 12/28/17 0607)     Initial Impression / Assessment and Plan / ED Course  I have reviewed the triage vital signs and the nursing notes.  Pertinent labs & imaging results that were available during my care of the patient were reviewed by me and considered in my medical decision making (see chart for details).     Patient presents with flank pain.  Seems to be worse with motion.  He is chronically ill-appearing but nontoxic.  He was noted to be hypoxic.  Does not wear home oxygen.  He is currently requiring 3 L.  If he take oxygen off of him he desats into the low 80s.  He does appear volume overloaded.  He denies any shortness of breath or chest pain at this time.  Creatinine noted to be 2.72 which is up from 1.8.  He has had some recent adjustment in Lasix.  CT scan stone study obtained.  No renal stone noted.  No obvious etiology for his right flank pain.  There is no rash to suggest shingles.  More concerning is his chest x-ray which shows  volume overload.  I feel that he is in decompensated heart failure given his hypoxia and evidence of volume overload on chest x-ray.  He will need close monitoring for diuresis given his renal function.  I discussed this with the patient.   Final Clinical Impressions(s) / ED Diagnoses   Final diagnoses:  Acute decompensated heart failure ([place])  Flank pain    ED Discharge Orders    None       Merryl Hacker, MD 12/28/17 [phone]

## 2017-12-28 NOTE — ED Notes (Signed)
Attempted report x 2 

## 2017-12-28 NOTE — ED Notes (Signed)
Pt. To CT via stretcher. 

## 2017-12-28 NOTE — ED Notes (Signed)
RN to receive patient currently on 5C picking up another patient

## 2017-12-28 NOTE — ED Notes (Signed)
Spoke to Admitting MD about stronger pain medication for patient due to limited result with Tylenol

## 2017-12-28 NOTE — ED Notes (Signed)
Admitting at bedside 

## 2017-12-29 ENCOUNTER — Inpatient Hospital Stay (HOSPITAL_COMMUNITY): Payer: BC Managed Care – PPO

## 2017-12-29 ENCOUNTER — Ambulatory Visit: Payer: BC Managed Care – PPO

## 2017-12-29 DIAGNOSIS — I5042 Chronic combined systolic (congestive) and diastolic (congestive) heart failure: Secondary | ICD-10-CM

## 2017-12-29 LAB — BASIC METABOLIC PANEL
Anion gap: 15 (ref 5–15)
BUN: 61 mg/dL — ABNORMAL HIGH (ref 6–20)
CHLORIDE: 90 mmol/L — AB (ref 98–111)
CO2: 31 mmol/L (ref 22–32)
Calcium: 8.7 mg/dL — ABNORMAL LOW (ref 8.9–10.3)
Creatinine, Ser: 2.6 mg/dL — ABNORMAL HIGH (ref 0.61–1.24)
GFR calc non Af Amer: 26 mL/min — ABNORMAL LOW (ref >60)
GFR, EST AFRICAN AMERICAN: 31 mL/min — AB (ref >60)
Glucose, Bld: 162 mg/dL — ABNORMAL HIGH (ref 70–99)
POTASSIUM: 3.1 mmol/L — AB (ref 3.5–5.1)
Sodium: 136 mmol/L (ref 135–145)

## 2017-12-29 LAB — CBC
HEMATOCRIT: 38.4 % — AB (ref 39.0–52.0)
HEMOGLOBIN: 12.9 g/dL — AB (ref 13.0–17.0)
MCH: 31.6 pg (ref 26.0–34.0)
MCHC: 33.6 g/dL (ref 30.0–36.0)
MCV: 94.1 fL (ref 78.0–100.0)
Platelets: 216 10*3/uL (ref 150–400)
RBC: 4.08 MIL/uL — AB (ref 4.22–5.81)
RDW: 13.5 % (ref 11.5–15.5)
WBC: 10.1 10*3/uL (ref 4.0–10.5)

## 2017-12-29 LAB — GLUCOSE, CAPILLARY
Glucose-Capillary: 170 mg/dL — ABNORMAL HIGH (ref 70–99)
Glucose-Capillary: 230 mg/dL — ABNORMAL HIGH (ref 70–99)
Glucose-Capillary: 163 mg/dL — ABNORMAL HIGH (ref 70–99)
Glucose-Capillary: 160 mg/dL — ABNORMAL HIGH (ref 70–99)

## 2017-12-29 LAB — HIV ANTIBODY (ROUTINE TESTING W REFLEX): HIV Screen 4th Generation wRfx: NONREACTIVE

## 2017-12-29 MED ORDER — METHOCARBAMOL 500 MG PO TABS
500.0000 mg | ORAL_TABLET | Freq: Three times a day (TID) | ORAL | Status: DC
  Administered 2017-12-29 – 2018-01-04 (×19): 500 mg via ORAL
  Filled 2017-12-29 (×19): qty 1

## 2017-12-29 MED ORDER — POTASSIUM CHLORIDE CRYS ER 20 MEQ PO TBCR
40.0000 meq | EXTENDED_RELEASE_TABLET | Freq: Once | ORAL | Status: AC
  Administered 2017-12-29: 40 meq via ORAL
  Filled 2017-12-29: qty 2

## 2017-12-29 NOTE — Progress Notes (Signed)
Patient refused CPAP for the night  

## 2017-12-29 NOTE — Progress Notes (Addendum)
Progress Note  Patient Name: Jay Chen Date of Encounter: 12/29/2017  Primary Cardiologist: Quay Burow, MD   Subjective   Nuys any chest pain or shortness of breath.  Creatinine slightly improved from 2.72-2.60 today after holding diuretics  Inpatient Medications    Scheduled Meds: . atorvastatin  40 mg Oral Daily  . carvedilol  6.25 mg Oral BID  . cloNIDine  0.1 mg Oral BID  . feeding supplement (ENSURE ENLIVE)  237 mL Oral BID BM  . insulin aspart  0-5 Units Subcutaneous QHS  . insulin aspart  0-9 Units Subcutaneous TID WC  . magnesium oxide  400 mg Oral Daily  . omega-3 acid ethyl esters  2 g Oral BID  . potassium chloride SA  40 mEq Oral Daily  . rivaroxaban  15 mg Oral Q supper  . senna-docusate  2 tablet Oral QHS  . sodium chloride flush  3 mL Intravenous Q12H   Continuous Infusions: . sodium chloride     PRN Meds: sodium chloride, acetaminophen **OR** acetaminophen, albuterol, ondansetron **OR** ondansetron (ZOFRAN) IV, oxyCODONE, polyethylene glycol, sodium chloride flush, traZODone   Vital Signs    Vitals:   12/28/17 1958 12/28/17 2000 12/29/17 0426 12/29/17 0500  BP: (!) 78/69 125/90 117/89   Pulse: 89 78 (!) 119   Resp: 18  18   Temp: 97.7 F (36.5 C)  99.9 F (37.7 C)   TempSrc: Oral  Oral   SpO2: 91% 92% (!) 87%   Weight:    (!) 159.5 kg  Height:        Intake/Output Summary (Last 24 hours) at 12/29/2017 1011 Last data filed at 12/29/2017 0400 Gross per 24 hour  Intake 242.89 ml  Output 475 ml  Net -232.11 ml   Filed Weights   12/27/17 2349 12/29/17 0500  Weight: (!) 163.3 kg (!) 159.5 kg    Telemetry    Atrial fibrillation with controlled ventricular response - Personally Reviewed  ECG    No new EKG to review today- Personally Reviewed  Physical Exam   GEN: No acute distress.  Morbidly obese Neck: No JVD Cardiac:  Irregularly irregular, no murmurs, rubs, or gallops.  Respiratory: Clear to auscultation bilaterally. GI:  Soft, nontender, non-distended  MS:  1+ nonpitting lower extremity chronic venous stasis changes edema; No deformity. Neuro:  Nonfocal  Psych: Normal affect   Labs    Chemistry Recent Labs  Lab 12/27/17 2354 12/29/17 0511  NA 137 136  K 3.3* 3.1*  CL 91* 90*  CO2 30 31  GLUCOSE 174* 162*  BUN 56* 61*  CREATININE 2.72* 2.60*  CALCIUM 9.2 8.7*  GFRNONAA 25* 26*  GFRAA 29* 31*  ANIONGAP 16* 15     Hematology Recent Labs  Lab 12/27/17 2354 12/29/17 0511  WBC 9.8 10.1  RBC 4.44 4.08*  HGB 13.9 12.9*  HCT 41.9 38.4*  MCV 94.4 94.1  MCH 31.3 31.6  MCHC 33.2 33.6  RDW 13.6 13.5  PLT 242 216    Cardiac EnzymesNo results for input(s): TROPONINI in the last 168 hours. No results for input(s): TROPIPOC in the last 168 hours.   BNP Recent Labs  Lab 12/27/17 2354  BNP 283.3*     DDimer No results for input(s): DDIMER in the last 168 hours.   Radiology    Dg Chest 2 View  Result Date: 12/28/2017 CLINICAL DATA:  Acute onset of generalized abdominal pain and hypoxia. EXAM: CHEST - 2 VIEW COMPARISON:  CTA of the chest  performed 03/28/2012 FINDINGS: The lungs are well-aerated. Mild vascular congestion is noted. Bibasilar airspace opacities may reflect mild interstitial edema. There is no evidence of significant pleural effusion or pneumothorax. The heart is borderline enlarged. No acute osseous abnormalities are seen. IMPRESSION: Mild vascular congestion and borderline cardiomegaly. Bibasilar airspace opacities may reflect mild interstitial edema. Electronically Signed   By: Garald Balding M.D.   On: 12/28/2017 05:34   US Aorta Complete  Result Date: 12/28/2017 CLINICAL DATA:  Back pain.  History of thoracic aneurysm. EXAM: ULTRASOUND OF ABDOMINAL AORTA TECHNIQUE: Ultrasound examination of the abdominal aorta was performed to evaluate for abdominal aortic aneurysm. COMPARISON:  CT abdomen and pelvis December 28, 2017 FINDINGS: Limited assessment due to large body habitus.  Abdominal aortic measurements as follows: Proximal:  2.7 x 2.8 cm Mid:  2.2 x 2.2 cm Distal: Not sonographically identified. IMPRESSION: 1. Body habitus limited examination without abdominal aortic aneurysm. Please see CT from same day, reported separately for additional detailed. Electronically Signed   By: Elon Alas M.D.   On: 12/28/2017 20:29   Ct Renal Stone Study  Result Date: 12/28/2017 CLINICAL DATA:  Acute onset of right flank pain. EXAM: CT ABDOMEN AND PELVIS WITHOUT CONTRAST TECHNIQUE: Multidetector CT imaging of the abdomen and pelvis was performed following the standard protocol without IV contrast. COMPARISON:  CT of the abdomen and pelvis performed 04/07/2008 FINDINGS: Lower chest: Mild bibasilar atelectasis or scarring is noted. The heart is mildly enlarged. Trace pericardial fluid remains within normal limits. Hepatobiliary: The liver is unremarkable in appearance. The patient is status post cholecystectomy, with clips noted at the gallbladder fossa. The common bile duct remains normal in caliber. Pancreas: The pancreas is within normal limits. Spleen: The spleen is unremarkable in appearance. Adrenals/Urinary Tract: A poorly characterized left adrenal adenoma is noted, measuring perhaps 1.9 cm. The right adrenal gland is unremarkable. A 2 mm nonobstructing stone is noted at the lower pole of the right kidney. There is no evidence of hydronephrosis. No obstructing ureteral stones are identified. Nonspecific perinephric stranding is noted bilaterally. Stomach/Bowel: The stomach is unremarkable in appearance. The small bowel is within normal limits. The appendix is normal in caliber, without evidence of appendicitis. The colon is unremarkable in appearance. Vascular/Lymphatic: The abdominal aorta is unremarkable in appearance. The inferior vena cava is grossly unremarkable. No retroperitoneal lymphadenopathy is seen. No pelvic sidewall lymphadenopathy is identified. Reproductive: The bladder  is mildly distended and grossly unremarkable. The prostate is enlarged, measuring 5.3 cm in transverse dimension, with mild calcification. Other: No additional soft tissue abnormalities are seen. Musculoskeletal: No acute osseous abnormalities are identified. The visualized musculature is unremarkable in appearance. IMPRESSION: 1. No acute abnormality seen to explain the patient's symptoms. 2. 2 mm nonobstructing stone at the lower pole of the right kidney. 3. Mild bibasilar atelectasis or scarring noted. 4. Mild cardiomegaly. 5. Left adrenal adenoma incidentally noted. Electronically Signed   By: Garald Balding M.D.   On: 12/28/2017 05:51    Cardiac Studies   2D echo 10/31/2017 Study Conclusions  - Left ventricle: The cavity size was moderately to severely   dilated. Wall thickness was increased in a pattern of moderate   LVH. Systolic function was severely reduced. The estimated   ejection fraction was in the range of 20% to 25%. Diffuse   hypokinesis. The study is not technically sufficient to allow   evaluation of LV diastolic function. - Left atrium: The atrium was severely dilated. - Right ventricle: The cavity size was  dilated. Systolic function   was moderately reduced. - Right atrium: The atrium was dilated. - Pericardium, extracardiac: A trivial pericardial effusion was   identified posterior to the heart. - Impressions: Technically very limited study due to extremely poor   sound wave transmission. There appears to be significant   buventricular dysfunction.  Impressions:  - Technically very limited study due to extremely poor sound wave   transmission. There appears to be significant buventricular   dysfunction.  Patient Profile     52 y.o. male with a hx of morbid obesity, OSA intolerant of CPAP, cor pulmonale, DM, HTN, HLD, 4.8cm thoracic aortic aneurysm by CT 2013 who is being seen for the evaluation of CHF med adjustment at the request of Dr. Denton Brick.  Assessment &  Plan    1. Right flank pain - etiology unclear, further evaluation per internal medicine.  -Temp elevated at 99.9 this morning -UA negative -2 mm nonobstructing renal stone in lower pole of right kidney by CT -TRH  2. Acute kidney injury superimposed on suspected CKD stage III  -Creatinine minimally improved from 2.72 on admission now to 2.6 after holding diuretics  3. Chronic combined CHF with recently discovered decreased biventricular failure  - clinically the patient's volume status is impossible to evaluate given his severe obesity and chronic appearing lymphedema.   -His BNP is mildly elevated but this can also be seen in the setting of renal insufficiency.   -He is down 4 kg from admission  -He denies any shortness of breath or worsening lower extremity edema -Chest x-ray suggested some edema, but the lower chest findings from his CT scan only showed atelectasis and scarring.   -Creatinine slightly improved from admission creatinine 2.72 and now 2.6 after holding Entresto and diuretics and given a small fluid bolus yesterday  -Continue to hold Entresto for now.  -If creatinine does not continue to improve with holding diuretics then may need right heart cath to determine filling pressures -BP at times is on the soft side running in the low 702 systolic and therefore will hold clonidine to allow more perfusion pressure to his kidneys  4. Persistent atrial fibrillation  -Mains in atrial fibrillation with controlled ventricular response  -Continue carvedilol X.2 5 mg twice daily.   - It does not appear there have been any plans to restore sinus rhythm.   -Given his severe morbid obesity and untreated sleep apnea as well as severely dilated left atrium, he would be unlikely to hold sinus without antiarrhythmic therapy.   -Xarelto dose has been adjusted for acute renal insufficiency.  5. Morbid obesity, with probable OHS (and OSA unable to tolerate CPAP) - long term weight loss  will be essential for his health. He is requiring nasal cannula here in the hospital. Old pulm notes from 2017 indicate O2 sat of 90% on RA.  6. Known thoracic aortic aneurysm - long overdue for follow-up.  -Given AKI cannot do CTA right now to reassess.  -UE pulses are equal bilaterally and no murmurs suggestive of AI.  -will need to be reassessed once renal function improves and able to do Cardec CTA  7.  Hypokalemia -Replete per TRH  I have spent a total of 35 minutes with patient reviewing 2D echo , telemetry, EKGs, labs and examining patient as well as establishing an assessment and plan that was discussed with the patient.  > 50% of time was spent in direct patient care.     For questions or updates, please contact  [facility] Please consult [email or web address] for contact info under Cardiology/STEMI.      Signed, Fransico Him, MD  12/29/2017, 10:11 AM

## 2017-12-29 NOTE — Progress Notes (Signed)
Pt refused CPAP. Resting comfortably will cont to monitor.

## 2017-12-29 NOTE — Progress Notes (Signed)
Patient requiring more oxygen to maintain sats of 92%, on 6L [place] now. Patient denies shortness of breath, does not appear to be in distress and lung sounds are clear/deminished. MD made aware, and new ordered received.

## 2017-12-29 NOTE — Progress Notes (Signed)
PROGRESS NOTE    Jay Chen  EGB:[id number] DOB: January 18, 1966 DOA: 12/27/2017 PCP: [name], MD   Brief Narrative: Patient is a 51 year old male with past medical history of systolic CHF with ejection fraction of 20 to 25%, diabetes, hypertension, hyperlipidemia, morbid obesity, obstructive sleep apnea, pulmonary hypertension, right-sided heart failure who presented with right-sided flank pain.  Patient also reported of some shortness of breath but no chest pain and  bilateral lower extremity edema.  CT imaging done in the emergency department did show nonobstructive renal calculi.  Patient was also found to have acute kidney injury on his CKD.  Chest x-ray showed cardiomegaly and mild vascular congestion.  Cardiology has been consulted.  Assessment & Plan:   Active Problems:   AKI (acute kidney injury) ([facility])   Chronic combined systolic and diastolic heart failure (HCC)  Right flank pain: Etiology unclear.  Patient complains of pain only when he moves.  Most likely associated with musculoskeletal etiology.  Cannot give NSAIDs due to his acute kidney injury.  We will try Robaxin.  CT shows 2 mm nonobstructing renal stone in the lower pole of right kidney.  Urinalysis not impressive.  Heart failure with reduced ejection fraction/history of right heart  failure: Last EF of 20 to 35%.  Chest x-ray showed possible pulmonary edema.  Cardiology following.  Currently his respiratory status stable.  Acute kidney injury on CKD stage III: Baseline creatinine ranges from 1.3-1.8.  Creatinine on admission was 2.7.  Avoid nephrotoxins.  Entresto held.  Diuretics held.  Diabetes type 2: Last A1c of 8.4.  On metformin and Farxiga at home which are on hold now.  Continue sliding scale for now.  Chronic A. fib: Stable.  Continue Coreg for rate control.  On Xarelto.  Morbid obesity/OSA/pulmonary hypertension: Continue CPAP at night.  Thoracic aortic aneurysm: Needs continued follow-up.  Cannot do CTA  for assessment due to acute kidney injury.  Hypokalemia: Supplemented   DVT prophylaxis: Xarelto Code Status: Full Family Communication: None present at the bedside Disposition Plan: Home in 1 to 2 days   Consultants: Cardiology  Procedures: None  Antimicrobials: None  Subjective: Patient seen and examined at bedside this morning.  Hemodynamically stable.  Complains of right flank pain only when he moves.  Denies any chest pain or shortness of breath.  Objective: Vitals:   12/29/17 0500 12/29/17 1018 12/29/17 1019 12/29/17 1335  BP:  (!) 86/60 112/82 103/79  Pulse:  63 (!) 110 (!) 104  Resp:    20  Temp:    (!) 100.5 F (38.1 C)  TempSrc:    Oral  SpO2:    92%  Weight: (!) 159.5 kg     Height:        Intake/Output Summary (Last 24 hours) at 12/29/2017 1452 Last data filed at 12/29/2017 0400 Gross per 24 hour  Intake 242.89 ml  Output 325 ml  Net -82.11 ml   Filed Weights   12/27/17 2349 12/29/17 0500  Weight: (!) 163.3 kg (!) 159.5 kg    Examination:  General exam: Appears calm and comfortable ,Not in distress, morbidly obese HEENT:PERRL,Oral mucosa moist, Ear/Nose normal on gross exam Respiratory system: Bilateral decreased air entry in the bases  cardiovascular system: A. fib. No JVD, murmurs, rubs, gallops or clicks.Trace pedal edema. Gastrointestinal system: Abdomen is nondistended, soft and nontender. No organomegaly or masses felt. Normal bowel sounds heard. Central nervous system: Alert and oriented. No focal neurological deficits. Extremities: Trace edema, no clubbing ,no cyanosis,  distal peripheral pulses palpable. Skin: No rashes, lesions or ulcers,no icterus ,no pallor MSK: Normal muscle bulk,tone ,power Psychiatry: Judgement and insight appear normal. Mood & affect appropriate.     Data Reviewed: I have personally reviewed following labs and imaging studies  CBC: Recent Labs  Lab 12/27/17 2354 12/29/17 0511  WBC 9.8 10.1  HGB 13.9 12.9*    HCT 41.9 38.4*  MCV 94.4 94.1  PLT 242 381   Basic Metabolic Panel: Recent Labs  Lab 12/27/17 2354 12/29/17 0511  NA 137 136  K 3.3* 3.1*  CL 91* 90*  CO2 30 31  GLUCOSE 174* 162*  BUN 56* 61*  CREATININE 2.72* 2.60*  CALCIUM 9.2 8.7*   GFR: Estimated Creatinine Clearance: 48.7 mL/min (A) (by C-G formula based on SCr of 2.6 mg/dL (H)). Liver Function Tests: No results for input(s): AST, ALT, ALKPHOS, BILITOT, PROT, ALBUMIN in the last 168 hours. No results for input(s): LIPASE, AMYLASE in the last 168 hours. No results for input(s): AMMONIA in the last 168 hours. Coagulation Profile: No results for input(s): INR, PROTIME in the last 168 hours. Cardiac Enzymes: No results for input(s): CKTOTAL, CKMB, CKMBINDEX, TROPONINI in the last 168 hours. BNP (last 3 results) No results for input(s): PROBNP in the last 8760 hours. HbA1C: No results for input(s): HGBA1C in the last 72 hours. CBG: Recent Labs  Lab 12/28/17 1705 12/28/17 2202 12/29/17 0800 12/29/17 1147  GLUCAP 179* 197* 170* 230*   Lipid Profile: No results for input(s): CHOL, HDL, LDLCALC, TRIG, CHOLHDL, LDLDIRECT in the last 72 hours. Thyroid Function Tests: No results for input(s): TSH, T4TOTAL, FREET4, T3FREE, THYROIDAB in the last 72 hours. Anemia Panel: No results for input(s): VITAMINB12, FOLATE, FERRITIN, TIBC, IRON, RETICCTPCT in the last 72 hours. Sepsis Labs: No results for input(s): PROCALCITON, LATICACIDVEN in the last 168 hours.  No results found for this or any previous visit (from the past 240 hour(s)).       Radiology Studies: Dg Chest 2 View  Result Date: 12/28/2017 CLINICAL DATA:  Acute onset of generalized abdominal pain and hypoxia. EXAM: CHEST - 2 VIEW COMPARISON:  CTA of the chest performed 03/28/2012 FINDINGS: The lungs are well-aerated. Mild vascular congestion is noted. Bibasilar airspace opacities may reflect mild interstitial edema. There is no evidence of significant pleural  effusion or pneumothorax. The heart is borderline enlarged. No acute osseous abnormalities are seen. IMPRESSION: Mild vascular congestion and borderline cardiomegaly. Bibasilar airspace opacities may reflect mild interstitial edema. Electronically Signed   By: Garald Balding M.D.   On: 12/28/2017 05:34   US Aorta Complete  Result Date: 12/28/2017 CLINICAL DATA:  Back pain.  History of thoracic aneurysm. EXAM: ULTRASOUND OF ABDOMINAL AORTA TECHNIQUE: Ultrasound examination of the abdominal aorta was performed to evaluate for abdominal aortic aneurysm. COMPARISON:  CT abdomen and pelvis December 28, 2017 FINDINGS: Limited assessment due to large body habitus. Abdominal aortic measurements as follows: Proximal:  2.7 x 2.8 cm Mid:  2.2 x 2.2 cm Distal: Not sonographically identified. IMPRESSION: 1. Body habitus limited examination without abdominal aortic aneurysm. Please see CT from same day, reported separately for additional detailed. Electronically Signed   By: Elon Alas M.D.   On: 12/28/2017 20:29   Ct Renal Stone Study  Result Date: 12/28/2017 CLINICAL DATA:  Acute onset of right flank pain. EXAM: CT ABDOMEN AND PELVIS WITHOUT CONTRAST TECHNIQUE: Multidetector CT imaging of the abdomen and pelvis was performed following the standard protocol without IV contrast. COMPARISON:  CT of  the abdomen and pelvis performed 04/07/2008 FINDINGS: Lower chest: Mild bibasilar atelectasis or scarring is noted. The heart is mildly enlarged. Trace pericardial fluid remains within normal limits. Hepatobiliary: The liver is unremarkable in appearance. The patient is status post cholecystectomy, with clips noted at the gallbladder fossa. The common bile duct remains normal in caliber. Pancreas: The pancreas is within normal limits. Spleen: The spleen is unremarkable in appearance. Adrenals/Urinary Tract: A poorly characterized left adrenal adenoma is noted, measuring perhaps 1.9 cm. The right adrenal gland is unremarkable.  A 2 mm nonobstructing stone is noted at the lower pole of the right kidney. There is no evidence of hydronephrosis. No obstructing ureteral stones are identified. Nonspecific perinephric stranding is noted bilaterally. Stomach/Bowel: The stomach is unremarkable in appearance. The small bowel is within normal limits. The appendix is normal in caliber, without evidence of appendicitis. The colon is unremarkable in appearance. Vascular/Lymphatic: The abdominal aorta is unremarkable in appearance. The inferior vena cava is grossly unremarkable. No retroperitoneal lymphadenopathy is seen. No pelvic sidewall lymphadenopathy is identified. Reproductive: The bladder is mildly distended and grossly unremarkable. The prostate is enlarged, measuring 5.3 cm in transverse dimension, with mild calcification. Other: No additional soft tissue abnormalities are seen. Musculoskeletal: No acute osseous abnormalities are identified. The visualized musculature is unremarkable in appearance. IMPRESSION: 1. No acute abnormality seen to explain the patient's symptoms. 2. 2 mm nonobstructing stone at the lower pole of the right kidney. 3. Mild bibasilar atelectasis or scarring noted. 4. Mild cardiomegaly. 5. Left adrenal adenoma incidentally noted. Electronically Signed   By: Garald Balding M.D.   On: 12/28/2017 05:51        Scheduled Meds: . atorvastatin  40 mg Oral Daily  . carvedilol  6.25 mg Oral BID  . feeding supplement (ENSURE ENLIVE)  237 mL Oral BID BM  . insulin aspart  0-5 Units Subcutaneous QHS  . insulin aspart  0-9 Units Subcutaneous TID WC  . magnesium oxide  400 mg Oral Daily  . omega-3 acid ethyl esters  2 g Oral BID  . potassium chloride SA  40 mEq Oral Daily  . rivaroxaban  15 mg Oral Q supper  . senna-docusate  2 tablet Oral QHS  . sodium chloride flush  3 mL Intravenous Q12H   Continuous Infusions: . sodium chloride       LOS: 1 day    Time spent: 25 mins.More than 50% of that time was spent  in counseling and/or coordination of care.      Shelly Coss, MD [facility] Pager [phone]  If 7PM-7AM, please contact night-coverage [email or web address] Password [email or web address] 12/29/2017, 2:52 PM

## 2017-12-30 DIAGNOSIS — I4891 Unspecified atrial fibrillation: Secondary | ICD-10-CM

## 2017-12-30 DIAGNOSIS — J189 Pneumonia, unspecified organism: Secondary | ICD-10-CM

## 2017-12-30 DIAGNOSIS — I420 Dilated cardiomyopathy: Secondary | ICD-10-CM

## 2017-12-30 DIAGNOSIS — J690 Pneumonitis due to inhalation of food and vomit: Secondary | ICD-10-CM

## 2017-12-30 LAB — GLUCOSE, CAPILLARY
GLUCOSE-CAPILLARY: 141 mg/dL — AB (ref 70–99)
GLUCOSE-CAPILLARY: 177 mg/dL — AB (ref 70–99)
Glucose-Capillary: 176 mg/dL — ABNORMAL HIGH (ref 70–99)
Glucose-Capillary: 168 mg/dL — ABNORMAL HIGH (ref 70–99)

## 2017-12-30 LAB — BASIC METABOLIC PANEL
ANION GAP: 13 (ref 5–15)
BUN: 60 mg/dL — ABNORMAL HIGH (ref 6–20)
CHLORIDE: 90 mmol/L — AB (ref 98–111)
CO2: 34 mmol/L — ABNORMAL HIGH (ref 22–32)
CREATININE: 2.31 mg/dL — AB (ref 0.61–1.24)
Calcium: 9 mg/dL (ref 8.9–10.3)
GFR calc non Af Amer: 31 mL/min — ABNORMAL LOW (ref >60)
GFR, EST AFRICAN AMERICAN: 35 mL/min — AB (ref >60)
Glucose, Bld: 151 mg/dL — ABNORMAL HIGH (ref 70–99)
Potassium: 3.4 mmol/L — ABNORMAL LOW (ref 3.5–5.1)
SODIUM: 137 mmol/L (ref 135–145)

## 2017-12-30 MED ORDER — SODIUM CHLORIDE 0.9 % IV SOLN
1.0000 g | INTRAVENOUS | Status: DC
  Administered 2017-12-30: 1 g via INTRAVENOUS
  Filled 2017-12-30 (×2): qty 10

## 2017-12-30 MED ORDER — AMIODARONE HCL IN DEXTROSE 360-4.14 MG/200ML-% IV SOLN
60.0000 mg/h | INTRAVENOUS | Status: AC
  Administered 2017-12-30 (×2): 60 mg/h via INTRAVENOUS
  Filled 2017-12-30: qty 200

## 2017-12-30 MED ORDER — ORAL CARE MOUTH RINSE
15.0000 mL | Freq: Two times a day (BID) | OROMUCOSAL | Status: DC
  Administered 2017-12-30 – 2018-01-02 (×7): 15 mL via OROMUCOSAL

## 2017-12-30 MED ORDER — RIVAROXABAN 20 MG PO TABS
20.0000 mg | ORAL_TABLET | Freq: Every day | ORAL | Status: DC
  Administered 2017-12-30 – 2018-01-03 (×5): 20 mg via ORAL
  Filled 2017-12-30 (×5): qty 1

## 2017-12-30 MED ORDER — OXYCODONE HCL 5 MG PO TABS
10.0000 mg | ORAL_TABLET | ORAL | Status: DC | PRN
  Administered 2017-12-31 – 2018-01-04 (×10): 10 mg via ORAL
  Filled 2017-12-30 (×10): qty 2

## 2017-12-30 MED ORDER — AMIODARONE HCL IN DEXTROSE 360-4.14 MG/200ML-% IV SOLN
30.0000 mg/h | INTRAVENOUS | Status: DC
  Administered 2017-12-31: 30 mg/h via INTRAVENOUS
  Filled 2017-12-30 (×2): qty 200

## 2017-12-30 MED ORDER — FUROSEMIDE 40 MG PO TABS
40.0000 mg | ORAL_TABLET | Freq: Every day | ORAL | Status: DC
  Administered 2017-12-30 – 2018-01-02 (×4): 40 mg via ORAL
  Filled 2017-12-30 (×4): qty 1

## 2017-12-30 MED ORDER — SODIUM CHLORIDE 0.9 % IV SOLN
100.0000 mg | Freq: Two times a day (BID) | INTRAVENOUS | Status: DC
  Administered 2017-12-30 – 2017-12-31 (×2): 100 mg via INTRAVENOUS
  Filled 2017-12-30 (×3): qty 100

## 2017-12-30 MED ORDER — AMIODARONE LOAD VIA INFUSION
150.0000 mg | Freq: Once | INTRAVENOUS | Status: AC
  Administered 2017-12-30: 150 mg via INTRAVENOUS
  Filled 2017-12-30: qty 83.34

## 2017-12-30 NOTE — Progress Notes (Signed)
Amiodarone Drug - Drug Interaction Consult Note  Recommendations: No medication changes required at this point.  Amiodarone is metabolized by the cytochrome P450 system and therefore has the potential to cause many drug interactions. Amiodarone has an average plasma half-life of 50 days (range 20 to 100 days).   There is potential for drug interactions to occur several weeks or months after stopping treatment and the onset of drug interactions may be slow after initiating amiodarone.   [x]  Statins: Increased risk of myopathy. Simvastatin- restrict dose to 20mg  daily. Other statins: counsel patients to report any muscle pain or weakness immediately.  []  Anticoagulants: Amiodarone can increase anticoagulant effect. Consider warfarin dose reduction. Patients should be monitored closely and the dose of anticoagulant altered accordingly, remembering that amiodarone levels take several weeks to stabilize.  []  Antiepileptics: Amiodarone can increase plasma concentration of phenytoin, the dose should be reduced. Note that small changes in phenytoin dose can result in large changes in levels. Monitor patient and counsel on signs of toxicity.  [x]  Beta blockers: increased risk of bradycardia, AV block and myocardial depression. Sotalol - avoid concomitant use.  []   Calcium channel blockers (diltiazem and verapamil): increased risk of bradycardia, AV block and myocardial depression.  []   Cyclosporine: Amiodarone increases levels of cyclosporine. Reduced dose of cyclosporine is recommended.  []  Digoxin dose should be halved when amiodarone is started.  []  Diuretics: increased risk of cardiotoxicity if hypokalemia occurs.  []  Oral hypoglycemic agents (glyburide, glipizide, glimepiride): increased risk of hypoglycemia. Patient's glucose levels should be monitored closely when initiating amiodarone therapy.   []  Drugs that prolong the QT interval:  Torsades de pointes risk may be increased with  concurrent use - avoid if possible.  Monitor QTc, also keep magnesium/potassium WNL if concurrent therapy can't be avoided. [name] Antibiotics: e.g. fluoroquinolones, erythromycin. . Antiarrhythmics: e.g. quinidine, procainamide, disopyramide, sotalol. . Antipsychotics: e.g. phenothiazines, haloperidol.  . Lithium, tricyclic antidepressants, and methadone.  Thank You,   Renold Genta, PharmD, BCPS Clinical Pharmacist Clinical phone for 12/30/2017 until 3p is [phone] Please check AMION for all Pharmacist numbers by unit 12/30/2017 10:01 AM

## 2017-12-30 NOTE — Progress Notes (Signed)
PROGRESS NOTE    Jay Chen  LSL:[id number] DOB: [birth year] DOA: 12/27/2017 PCP: [name], MD   Brief Narrative: Patient is a 52 year old male with past medical history of systolic CHF with ejection fraction of 20 to 25%, diabetes, hypertension, hyperlipidemia, morbid obesity, obstructive sleep apnea, pulmonary hypertension, right-sided heart failure who presented with right-sided flank pain.  Patient also reported of some shortness of breath but no chest pain and  bilateral lower extremity edema.  CT imaging done in the emergency department did show nonobstructive renal calculi.  Patient was also found to have acute kidney injury on his CKD.  Chest x-ray showed cardiomegaly and mild vascular congestion.  Cardiology has been consulted and following.  Assessment & Plan:   Principal Problem:   Atrial fibrillation with rapid ventricular response (HCC) Active Problems:   Obstructive sleep apnea   AKI (acute kidney injury) ([facility])   Flank pain   Chronic combined systolic and diastolic heart failure ([facility])   DCM (dilated cardiomyopathy) ([facility])   PNA (pneumonia)  Right flank pain/bilateral lower extremity weakness: Etiology unclear.  Patient complains of severe right flank / low back pain when he moves.  Also demonstrated severe bilateral lower extremity weakness.  This weakness is a new finding. We get MRI brain,lumbar spine . CT shows 2 mm nonobstructing renal stone in the lower pole of right kidney.  Urinalysis not impressive.   A. fib: Continue Coreg for rate control.  On Xarelto.Also started on amiodarone by cardiology.  Hypoxia/pneumonia: Chest x-ray showed possible opacity at the LEFT lung base.  Start on antibiotics  Heart failure with reduced ejection fraction/history of right heart  failure: Last EF of 20 to 35%.  Chest x-ray showed possible pulmonary edema.  Cardiology following.  Will resume Lasix.  Acute kidney injury on CKD stage III: Baseline creatinine ranges from  1.3-1.8.  Creatinine on admission was 2.7.  Avoid nephrotoxins.  Entresto held.   Diabetes type 2: Last A1c of 8.4.  On metformin and Farxiga at home which are on hold now.  Continue sliding scale for now.  Morbid obesity/OSA/pulmonary hypertension: Continue CPAP at night.  Thoracic aortic aneurysm: Needs continued follow-up.   Hypokalemia: Supplemented   DVT prophylaxis: Xarelto Code Status: Full Family Communication: None present at the bedside Disposition Plan: Pending PT evaluation.  Patient not stable for discharge   Consultants: Cardiology  Procedures: None  Antimicrobials: None  Subjective: Patient seen and examined at bedside this morning.  Hemodynamically stable.  Complains of continued right flank pain,back  only when he moves.  Complains of bilateral lower extremity pain and weakness.  We tried to ambulate him today but he could not stand on his feet.  Objective: Vitals:   12/30/17 0517 12/30/17 0631 12/30/17 1135 12/30/17 1213  BP:   118/78 114/74  Pulse: (!) 113     Resp:   18 (!) 22  Temp:      TempSrc:      SpO2: (!) 86% 93% 92% 92%  Weight:  (!) 157 kg    Height:        Intake/Output Summary (Last 24 hours) at 12/30/2017 1456 Last data filed at 12/30/2017 1100 Gross per 24 hour  Intake 200 ml  Output 300 ml  Net -100 ml   Filed Weights   12/27/17 2349 12/29/17 0500 12/30/17 0631  Weight: (!) 163.3 kg (!) 159.5 kg (!) 157 kg    Examination:  General exam: Morbidly obese , not in acute distress HEENT:PERRL,Oral mucosa moist,  Ear/Nose normal on gross exam Respiratory system: Bilateral decreased air entry, normal vesicular breath sounds, no wheezes or crackles  Cardiovascular system: A. fib. No JVD, murmurs, rubs, gallops or clicks. Gastrointestinal system: Abdomen is nondistended, soft and nontender. No organomegaly or masses felt. Normal bowel sounds heard. Central nervous system: Alert and oriented.  Bilateral lower extremity weakness Extremities:  Trace edema, no clubbing ,no cyanosis, distal peripheral pulses palpable. Skin: No rashes, lesions or ulcers,no icterus ,no pallor MSK: Normal muscle bulk,tone ,power Psychiatry: Judgement and insight appear normal. Mood & affect appropriate.        Data Reviewed: I have personally reviewed following labs and imaging studies  CBC: Recent Labs  Lab 12/27/17 2354 12/29/17 0511  WBC 9.8 10.1  HGB 13.9 12.9*  HCT 41.9 38.4*  MCV 94.4 94.1  PLT 242 161   Basic Metabolic Panel: Recent Labs  Lab 12/27/17 2354 12/29/17 0511 12/30/17 0550  NA 137 136 137  K 3.3* 3.1* 3.4*  CL 91* 90* 90*  CO2 30 31 34*  GLUCOSE 174* 162* 151*  BUN 56* 61* 60*  CREATININE 2.72* 2.60* 2.31*  CALCIUM 9.2 8.7* 9.0   GFR: Estimated Creatinine Clearance: 54.3 mL/min (A) (by C-G formula based on SCr of 2.31 mg/dL (H)). Liver Function Tests: No results for input(s): AST, ALT, ALKPHOS, BILITOT, PROT, ALBUMIN in the last 168 hours. No results for input(s): LIPASE, AMYLASE in the last 168 hours. No results for input(s): AMMONIA in the last 168 hours. Coagulation Profile: No results for input(s): INR, PROTIME in the last 168 hours. Cardiac Enzymes: No results for input(s): CKTOTAL, CKMB, CKMBINDEX, TROPONINI in the last 168 hours. BNP (last 3 results) No results for input(s): PROBNP in the last 8760 hours. HbA1C: No results for input(s): HGBA1C in the last 72 hours. CBG: Recent Labs  Lab 12/29/17 1147 12/29/17 1650 12/29/17 2207 12/30/17 0753 12/30/17 1209  GLUCAP 230* 163* 160* 141* 176*   Lipid Profile: No results for input(s): CHOL, HDL, LDLCALC, TRIG, CHOLHDL, LDLDIRECT in the last 72 hours. Thyroid Function Tests: No results for input(s): TSH, T4TOTAL, FREET4, T3FREE, THYROIDAB in the last 72 hours. Anemia Panel: No results for input(s): VITAMINB12, FOLATE, FERRITIN, TIBC, IRON, RETICCTPCT in the last 72 hours. Sepsis Labs: No results for input(s): PROCALCITON, LATICACIDVEN in the  last 168 hours.  No results found for this or any previous visit (from the past 240 hour(s)).       Radiology Studies: Dg Chest 1 View  Result Date: 12/29/2017 CLINICAL DATA:  Follow-up exam. Requiring increased oxygen to keep O2 sats up. EXAM: CHEST  1 VIEW COMPARISON:  Chest x-ray dated 12/28/2016. FINDINGS: Stable cardiomegaly. Probable opacity at the LEFT lung base, difficult to characterize due to the overlying heart shadow. Persistent vascular congestion within the perihilar regions. RIGHT lung otherwise clear. No pneumothorax seen. IMPRESSION: 1. Cardiomegaly with central pulmonary vascular congestion suggesting mild CHF/volume overload. 2. Suspected opacity at the LEFT lung base, difficult to characterize due to the overlying heart. Cannot exclude pneumonia, atelectasis and/or pleural effusion at the LEFT lung base. Electronically Signed   By: Franki Cabot M.D.   On: 12/29/2017 19:25   US Aorta Complete  Result Date: 12/28/2017 CLINICAL DATA:  Back pain.  History of thoracic aneurysm. EXAM: ULTRASOUND OF ABDOMINAL AORTA TECHNIQUE: Ultrasound examination of the abdominal aorta was performed to evaluate for abdominal aortic aneurysm. COMPARISON:  CT abdomen and pelvis December 28, 2017 FINDINGS: Limited assessment due to large body habitus. Abdominal aortic measurements as  follows: Proximal:  2.7 x 2.8 cm Mid:  2.2 x 2.2 cm Distal: Not sonographically identified. IMPRESSION: 1. Body habitus limited examination without abdominal aortic aneurysm. Please see CT from same day, reported separately for additional detailed. Electronically Signed   By: Elon Alas M.D.   On: 12/28/2017 20:29        Scheduled Meds: . atorvastatin  40 mg Oral Daily  . carvedilol  6.25 mg Oral BID  . feeding supplement (ENSURE ENLIVE)  237 mL Oral BID BM  . insulin aspart  0-5 Units Subcutaneous QHS  . insulin aspart  0-9 Units Subcutaneous TID WC  . magnesium oxide  400 mg Oral Daily  . mouth rinse  15 mL  Mouth Rinse BID  . methocarbamol  500 mg Oral TID  . omega-3 acid ethyl esters  2 g Oral BID  . potassium chloride SA  40 mEq Oral Daily  . rivaroxaban  20 mg Oral Q supper  . senna-docusate  2 tablet Oral QHS  . sodium chloride flush  3 mL Intravenous Q12H   Continuous Infusions: . sodium chloride    . amiodarone 60 mg/hr (12/30/17 1150)   Followed by  . amiodarone       LOS: 2 days    Time spent: 25 mins.More than 50% of that time was spent in counseling and/or coordination of care.      Shelly Coss, MD [facility] Pager [phone]  If 7PM-7AM, please contact night-coverage [email or web address] Password [email or web address] 12/30/2017, 2:56 PM

## 2017-12-30 NOTE — Progress Notes (Signed)
Progress Note  Patient Name: Jay Chen Date of Encounter: 12/30/2017  Primary Cardiologist: Quay Burow, MD   Subjective   Denies any chest pain or SOB.  Has been in afib with RVR in the 120-140's all night  Inpatient Medications    Scheduled Meds: . atorvastatin  40 mg Oral Daily  . carvedilol  6.25 mg Oral BID  . feeding supplement (ENSURE ENLIVE)  237 mL Oral BID BM  . insulin aspart  0-5 Units Subcutaneous QHS  . insulin aspart  0-9 Units Subcutaneous TID WC  . magnesium oxide  400 mg Oral Daily  . mouth rinse  15 mL Mouth Rinse BID  . methocarbamol  500 mg Oral TID  . omega-3 acid ethyl esters  2 g Oral BID  . potassium chloride SA  40 mEq Oral Daily  . rivaroxaban  15 mg Oral Q supper  . senna-docusate  2 tablet Oral QHS  . sodium chloride flush  3 mL Intravenous Q12H   Continuous Infusions: . sodium chloride     PRN Meds: sodium chloride, acetaminophen **OR** acetaminophen, albuterol, ondansetron **OR** ondansetron (ZOFRAN) IV, oxyCODONE, polyethylene glycol, sodium chloride flush, traZODone   Vital Signs    Vitals:   12/29/17 2020 12/30/17 0516 12/30/17 0517 12/30/17 0631  BP: 122/90 107/81    Pulse: (!) 107 (!) 121 (!) 113   Resp: 20 20    Temp: 98.7 F (37.1 C) 99.1 F (37.3 C)    TempSrc: Oral Oral    SpO2: 95% (!) 80% (!) 86% 93%  Weight:    (!) 157 kg  Height:        Intake/Output Summary (Last 24 hours) at 12/30/2017 0854 Last data filed at 12/30/2017 0351 Gross per 24 hour  Intake 111 ml  Output 200 ml  Net -89 ml   Filed Weights   12/27/17 2349 12/29/17 0500 12/30/17 0631  Weight: (!) 163.3 kg (!) 159.5 kg (!) 157 kg    Telemetry    Atrial fibrillation with RVR- Personally Reviewed  ECG    No new EKG to review- Personally Reviewed  Physical Exam   GEN: Well nourished, well developed in no acute distress HEENT: Normal NECK: No JVD; No carotid bruits LYMPHATICS: No lymphadenopathy CARDIAC:irregularly irregular and  tachycardic, no murmurs, rubs, gallops RESPIRATORY:  Clear to auscultation without rales, wheezing or rhonchi  ABDOMEN: Soft, non-tender, non-distended MUSCULOSKELETAL:  No edema; No deformity  SKIN: Warm and dry NEUROLOGIC:  Alert and oriented x 3 PSYCHIATRIC:  Normal affect    Labs    Chemistry Recent Labs  Lab 12/27/17 2354 12/29/17 0511 12/30/17 0550  NA 137 136 137  K 3.3* 3.1* 3.4*  CL 91* 90* 90*  CO2 30 31 34*  GLUCOSE 174* 162* 151*  BUN 56* 61* 60*  CREATININE 2.72* 2.60* 2.31*  CALCIUM 9.2 8.7* 9.0  GFRNONAA 25* 26* 31*  GFRAA 29* 31* 35*  ANIONGAP 16* 15 13     Hematology Recent Labs  Lab 12/27/17 2354 12/29/17 0511  WBC 9.8 10.1  RBC 4.44 4.08*  HGB 13.9 12.9*  HCT 41.9 38.4*  MCV 94.4 94.1  MCH 31.3 31.6  MCHC 33.2 33.6  RDW 13.6 13.5  PLT 242 216    Cardiac EnzymesNo results for input(s): TROPONINI in the last 168 hours. No results for input(s): TROPIPOC in the last 168 hours.   BNP Recent Labs  Lab 12/27/17 2354  BNP 283.3*     DDimer No results for input(s):  DDIMER in the last 168 hours.   Radiology    Dg Chest 1 View  Result Date: 12/29/2017 CLINICAL DATA:  Follow-up exam. Requiring increased oxygen to keep O2 sats up. EXAM: CHEST  1 VIEW COMPARISON:  Chest x-ray dated 12/28/2016. FINDINGS: Stable cardiomegaly. Probable opacity at the LEFT lung base, difficult to characterize due to the overlying heart shadow. Persistent vascular congestion within the perihilar regions. RIGHT lung otherwise clear. No pneumothorax seen. IMPRESSION: 1. Cardiomegaly with central pulmonary vascular congestion suggesting mild CHF/volume overload. 2. Suspected opacity at the LEFT lung base, difficult to characterize due to the overlying heart. Cannot exclude pneumonia, atelectasis and/or pleural effusion at the LEFT lung base. Electronically Signed   By: Franki Cabot M.D.   On: 12/29/2017 19:25   US Aorta Complete  Result Date: 12/28/2017 CLINICAL DATA:   Back pain.  History of thoracic aneurysm. EXAM: ULTRASOUND OF ABDOMINAL AORTA TECHNIQUE: Ultrasound examination of the abdominal aorta was performed to evaluate for abdominal aortic aneurysm. COMPARISON:  CT abdomen and pelvis December 28, 2017 FINDINGS: Limited assessment due to large body habitus. Abdominal aortic measurements as follows: Proximal:  2.7 x 2.8 cm Mid:  2.2 x 2.2 cm Distal: Not sonographically identified. IMPRESSION: 1. Body habitus limited examination without abdominal aortic aneurysm. Please see CT from same day, reported separately for additional detailed. Electronically Signed   By: Elon Alas M.D.   On: 12/28/2017 20:29    Cardiac Studies   2D echo 10/31/2017 Study Conclusions  - Left ventricle: The cavity size was moderately to severely   dilated. Wall thickness was increased in a pattern of moderate   LVH. Systolic function was severely reduced. The estimated   ejection fraction was in the range of 20% to 25%. Diffuse   hypokinesis. The study is not technically sufficient to allow   evaluation of LV diastolic function. - Left atrium: The atrium was severely dilated. - Right ventricle: The cavity size was dilated. Systolic function   was moderately reduced. - Right atrium: The atrium was dilated. - Pericardium, extracardiac: A trivial pericardial effusion was   identified posterior to the heart. - Impressions: Technically very limited study due to extremely poor   sound wave transmission. There appears to be significant   buventricular dysfunction.  Impressions:  - Technically very limited study due to extremely poor sound wave   transmission. There appears to be significant buventricular   dysfunction.  Patient Profile     52 y.o. male with a hx of morbid obesity, OSA intolerant of CPAP, cor pulmonale, DM, HTN, HLD, 4.8cm thoracic aortic aneurysm by CT 2013 who is being seen for the evaluation of CHF med adjustment at the request of Dr.  Denton Brick.  Assessment & Plan    1. Right flank pain -etiology unclear, further evaluation per internal medicine.  -had fever up to 100.5 yesterday -UA negative -2 mm nonobstructing renal stone in lower pole of right kidney by CT -per TR[facility]  2. Acute kidney injury superimposed on suspected CKD stage III  -likely secondary to intravascular volume depletion -Creatinine slowly improving  (2.72 on admission >2.6>2.31) with diuretics on hold  3. 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   -His BNP is mildly elevated but he has severe LV dysfunction and likely has chronically elevated BNP especially in setting of acute on CKD -He continues to drop weight off diuretics (weight  down 6kg from admit) -He denies any shortness of breath or worsening lower extremity edema -Chest x-ray suggested some edema, but the lower chest findings from his CT scan only showed atelectasis and scarring.   -I&Os incomplete -Creatinine continues to improve with holding diuretics, holding Entresto and given gentle hydration -Continue to hold Entresto for now.  -Clonidine stopped to allow higher BP for better renal perfusion -continue Carvedilol to help with afib rate control  4. Persistent atrial fibrillation  -Remains  in atrial fibrillation but now with rapid rate form 120 - 140bpm - may be driven some by fever -Continue carvedilol 6.2 5 mg twice daily.  Cannot titrate up due to soft BP -It does not appear there have been any plans to restore sinus rhythm.   -Given his severe morbid obesity and untreated sleep apnea as well as severely dilated left atrium, he would be unlikely to hold sinus without antiarrhythmic therapy.   -will start IV Amio gtt with 150mg  bolus to try to get rate controlled -Xarelto dose has been adjusted for acute renal  insufficiency.  5. Morbid obesity, with probable OHS (and OSA unable to tolerate CPAP) - long term weight loss will be essential for his health. He is requiring nasal cannula here in the hospital. Old pulm notes from 2017 indicate O2 sat of 90% on RA.  6. Known thoracic aortic aneurysm - long overdue for follow-up.  -Given AKI cannot do CTA right now to reassess.  -UE pulses are equal bilaterally and no murmurs suggestive of AI.  -will need to be reassessed once renal function improves and able to do Cardiac CTA  7.  Hypokalemia -Replete per TRH -K+ 3.4 today  I have spent a total of 35 minutes with patient reviewing hospital notes , telemetry, EKGs, labs and examining patient as well as establishing an assessment and plan that was discussed with the patient.  > 50% of time was spent in direct patient care.     For questions or updates, please contact [place] Please consult [email or web address] for contact info under Cardiology/STEMI.      Signed, Fransico Him, MD  12/30/2017, 8:54 AM

## 2017-12-31 ENCOUNTER — Inpatient Hospital Stay (HOSPITAL_COMMUNITY): Payer: BC Managed Care – PPO

## 2017-12-31 LAB — GLUCOSE, CAPILLARY
GLUCOSE-CAPILLARY: 137 mg/dL — AB (ref 70–99)
GLUCOSE-CAPILLARY: 128 mg/dL — AB (ref 70–99)
GLUCOSE-CAPILLARY: 140 mg/dL — AB (ref 70–99)
Glucose-Capillary: 134 mg/dL — ABNORMAL HIGH (ref 70–99)

## 2017-12-31 LAB — CBC WITH DIFFERENTIAL/PLATELET
ABS IMMATURE GRANULOCYTES: 0 10*3/uL (ref 0.0–0.1)
Basophils Absolute: 0 10*3/uL (ref 0.0–0.1)
Basophils Relative: 0 %
Eosinophils Absolute: 0.1 10*3/uL (ref 0.0–0.7)
Eosinophils Relative: 1 %
HEMATOCRIT: 39.9 % (ref 39.0–52.0)
HEMOGLOBIN: 13 g/dL (ref 13.0–17.0)
Immature Granulocytes: 0 %
LYMPHS ABS: 1.3 10*3/uL (ref 0.7–4.0)
LYMPHS PCT: 14 %
MCH: 31.3 pg (ref 26.0–34.0)
MCHC: 32.6 g/dL (ref 30.0–36.0)
MCV: 96.1 fL (ref 78.0–100.0)
MONO ABS: 1.1 10*3/uL — AB (ref 0.1–1.0)
MONOS PCT: 12 %
NEUTROS ABS: 6.6 10*3/uL (ref 1.7–7.7)
NEUTROS PCT: 73 %
PLATELETS: 227 10*3/uL (ref 150–400)
RBC: 4.15 MIL/uL — ABNORMAL LOW (ref 4.22–5.81)
RDW: 13.8 % (ref 11.5–15.5)
WBC: 9.1 10*3/uL (ref 4.0–10.5)

## 2017-12-31 LAB — BASIC METABOLIC PANEL
ANION GAP: 13 (ref 5–15)
BUN: 57 mg/dL — ABNORMAL HIGH (ref 6–20)
CHLORIDE: 92 mmol/L — AB (ref 98–111)
CO2: 33 mmol/L — AB (ref 22–32)
Calcium: 9.1 mg/dL (ref 8.9–10.3)
Creatinine, Ser: 2.18 mg/dL — ABNORMAL HIGH (ref 0.61–1.24)
GFR calc Af Amer: 38 mL/min — ABNORMAL LOW (ref >60)
GFR calc non Af Amer: 33 mL/min — ABNORMAL LOW (ref >60)
GLUCOSE: 139 mg/dL — AB (ref 70–99)
POTASSIUM: 3.6 mmol/L (ref 3.5–5.1)
Sodium: 138 mmol/L (ref 135–145)

## 2017-12-31 LAB — MRSA PCR SCREENING: MRSA by PCR: NEGATIVE

## 2017-12-31 MED ORDER — DOXYCYCLINE HYCLATE 100 MG PO TABS
100.0000 mg | ORAL_TABLET | Freq: Two times a day (BID) | ORAL | Status: DC
  Administered 2017-12-31 – 2018-01-04 (×9): 100 mg via ORAL
  Filled 2017-12-31 (×9): qty 1

## 2017-12-31 MED ORDER — AMIODARONE HCL 200 MG PO TABS
200.0000 mg | ORAL_TABLET | Freq: Two times a day (BID) | ORAL | Status: DC
  Administered 2017-12-31 – 2018-01-04 (×9): 200 mg via ORAL
  Filled 2017-12-31 (×9): qty 1

## 2017-12-31 MED ORDER — SODIUM CHLORIDE 0.9 % IV SOLN
2.0000 g | INTRAVENOUS | Status: DC
  Administered 2017-12-31 – 2018-01-02 (×3): 2 g via INTRAVENOUS
  Filled 2017-12-31 (×4): qty 20

## 2017-12-31 NOTE — Progress Notes (Addendum)
PROGRESS NOTE    Jay Chen  NTZ:[id number] DOB: [birth year] DOA: 12/27/2017 PCP: [name], MD  Brief Narrative:52 year old male with past medical history of systolic CHF with ejection fraction of 20 to 25%, diabetes, hypertension, hyperlipidemia, morbid obesity, obstructive sleep apnea, pulmonary hypertension, right-sided heart failure who presented with right-sided flank pain.  Patient also reported of some shortness of breath but no chest pain and  bilateral lower extremity edema.  CT imaging done in the emergency department did show nonobstructive renal calculi.  Patient was also found to have acute kidney injury on his CKD.  Chest x-ray showed cardiomegaly and mild vascular congestion.  Cardiology has been consulted and following.  Assessment & Plan:   Principal Problem:   Atrial fibrillation with rapid ventricular response (HCC) Active Problems:   Obstructive sleep apnea   AKI (acute kidney injury) (HCC)   Flank pain   Chronic combined systolic and diastolic heart failure (HCC)   DCM (dilated cardiomyopathy) (HCC)   PNA (pneumonia)  1] bilateral lower extremity weakness patient reports this is something new.  He reports that he lives at home when he walks around over.  He also complains of his lower back pain.  MRI of the spine is been ordered yesterday has not been done yet.  I have reordered the MRI as a stat order to rule out lumbar spinal stenosis or cauda equina.  Patient reports he does not have any urinary or bowel incontinence.  He reports constipation which is nothing new.  He was not able to move his leg passively or actively.  Await stat MRI of the lumbar spine.  If MRI comes back negative I will obtain PT evaluation.  MRI of the brain was ordered yesterday which is also not done yet.  Patient is awake alert answers all my questions appropriately.  Denies any headache changes with his vision nausea or vomiting.  MRI shows L 4 L5 disc bulge and spinal stenosis. 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 2] A. fib RVR on Coreg and amiodarone per cardiology.  Still on the amiodarone drip continue Xarelto.  3] obstructive sleep apnea patient has a history of OSA and has CPAP at home but does not use them.  We will try to use it while he is in the hospital to see if he can get used to the mask.  4] hypoxic respiratory failure secondary to acute on chronic systolic heart failure with ejection fraction 20 to 35%.  Chest x-ray with pulmonary edema on Lasix 40 mg daily.  5] hypoxic respiratory failure secondary to left lower lobe pneumonia and systolic CHF-patient on IV antibiotics at this time.  Continue IV Rocephin and doxycycline.  6] AKI on CKD stage III.  Entresto on hold follow-up daily labs.  Was also on metformin and Farxiga at home which is on hold at this time due to AKI.  Creatinine today is 2.18 which is down from 2.72 at the time of admission.  7] type 2 diabetes continue SSI hold metformin and for [employer] due to AKI.  8] morbid obesity obstructive sleep apnea and pulmonary hypertension patient noncompliant with CPAP encouraged to use while in the hospital.   9] thoracic aortic aneurysm need outpatient follow-up.  10] hypo kalemia resolved.ON DAILY KDUR with lasix.  11] hyperlipidemia continue Lipitor.  DVT prophylaxis: Xarelto Code Status: Full code Family Communication: No family available patient lives at home with family. Disposition Plan: TBD  Consultants:  Cardiology Procedures: None Antimicrobials: Rocephin and doxycycline  Subjective: Patient resting flat in bed complaining of lower extremity weakness breathing seems to be okay still on oxygen no chest pain.  Has not gotten out of bed.   Objective: Vitals:   12/31/17 0008 12/31/17 0020 12/31/17 0525 12/31/17 0746  BP:  106/78 104/74   Pulse:  94 96   Resp:  (!) 23 16   Temp: 98.7  F (37.1 C)  98.8 F (37.1 C)   TempSrc: Oral  Oral   SpO2:  92% 92%   Weight:    (!) 157.6 kg  Height:        Intake/Output Summary (Last 24 hours) at 12/31/2017 1004 Last data filed at 12/31/2017 1002 Gross per 24 hour  Intake 1877.79 ml  Output 950 ml  Net 927.79 ml   Filed Weights   12/29/17 0500 12/30/17 0631 12/31/17 0746  Weight: (!) 159.5 kg (!) 157 kg (!) 157.6 kg    Examination: Morbidly obese gentleman in no acute distress resting in bed. General exam: Appears calm and comfortable  Respiratory system: Clear to auscultation. Respiratory effort normal. Cardiovascular system: S1 & S2 heard, RRR. No JVD, murmurs, rubs, gallops or clicks. No pedal edema. Gastrointestinal system: Abdomen is nondistended, soft and nontender. No organomegaly or masses felt. Normal bowel sounds heard. Central nervous system: Alert and oriented. No focal neurological deficits. Extremities 1+ edema bilaterally. Skin: No rashes, lesions or ulcers Psychiatry: Judgement and insight appear normal. Mood & affect appropriate.     Data Reviewed: I have personally reviewed following labs and imaging studies  CBC: Recent Labs  Lab 12/27/17 2354 12/29/17 0511 12/31/17 0443  WBC 9.8 10.1 9.1  NEUTROABS  --   --  6.6  HGB 13.9 12.9* 13.0  HCT 41.9 38.4* 39.9  MCV 94.4 94.1 96.1  PLT 242 216 341   Basic Metabolic Panel: Recent Labs  Lab 12/27/17 2354 12/29/17 0511 12/30/17 0550 12/31/17 0443  NA 137 136 137 138  K 3.3* 3.1* 3.4* 3.6  CL 91* 90* 90* 92*  CO2 30 31 34* 33*  GLUCOSE 174* 162* 151* 139*  BUN 56* 61* 60* 57*  CREATININE 2.72* 2.60* 2.31* 2.18*  CALCIUM 9.2 8.7* 9.0 9.1   GFR: Estimated Creatinine Clearance: 57.7 mL/min (A) (by C-G formula based on SCr of 2.18 mg/dL (H)). Liver Function Tests: No results for input(s): AST, ALT, ALKPHOS, BILITOT, PROT, ALBUMIN in the last 168 hours. No results for input(s): LIPASE, AMYLASE in the last 168 hours. No results for  input(s): AMMONIA in the last 168 hours. Coagulation Profile: No results for input(s): INR, PROTIME in the last 168 hours. Cardiac Enzymes: No results for input(s): CKTOTAL, CKMB, CKMBINDEX, TROPONINI in the last 168 hours. BNP (last 3 results) No results for input(s): PROBNP in the last 8760 hours. HbA1C: No results for input(s): HGBA1C in the last 72 hours. CBG: Recent Labs  Lab 12/30/17 0753 12/30/17 1209 12/30/17 1638 12/30/17 2236 12/31/17 0807  GLUCAP 141* 176* 177* 168* 134*   Lipid Profile: No results for input(s): CHOL, HDL, LDLCALC, TRIG, CHOLHDL, LDLDIRECT in the last 72 hours. Thyroid Function Tests: No results for input(s): TSH, T4TOTAL, FREET4, T3FREE, THYROIDAB in the last 72 hours. Anemia Panel: No results for input(s): VITAMINB12, FOLATE, FERRITIN, TIBC, IRON, RETICCTPCT in the last 72 hours. Sepsis Labs: No results for input(s): PROCALCITON, LATICACIDVEN in the last 168 hours.  Recent Results (from the past 240 hour(s))  MRSA PCR Screening  Status: None   Collection Time: 12/31/17  5:05 AM  Result Value Ref Range Status   MRSA by PCR NEGATIVE NEGATIVE Final    Comment:        The GeneXpert MRSA Assay (FDA approved for NASAL specimens only), is one component of a comprehensive MRSA colonization surveillance program. It is not intended to diagnose MRSA infection nor to guide or monitor treatment for MRSA infections. Performed at [facility], [place] [address], [place], [place] [address]          Radiology Studies: Dg Chest 1 View  Result Date: 12/29/2017 CLINICAL DATA:  Follow-up exam. Requiring increased oxygen to keep O2 sats up. EXAM: CHEST  1 VIEW COMPARISON:  Chest x-ray dated 12/28/2016. FINDINGS: Stable cardiomegaly. Probable opacity at the LEFT lung base, difficult to characterize due to the overlying heart shadow. Persistent vascular congestion within the perihilar regions. RIGHT lung otherwise clear. No pneumothorax seen.  IMPRESSION: 1. Cardiomegaly with central pulmonary vascular congestion suggesting mild CHF/volume overload. 2. Suspected opacity at the LEFT lung base, difficult to characterize due to the overlying heart. Cannot exclude pneumonia, atelectasis and/or pleural effusion at the LEFT lung base. Electronically Signed   By: Franki Cabot M.D.   On: 12/29/2017 19:25        Scheduled Meds: . atorvastatin  40 mg Oral Daily  . carvedilol  6.25 mg Oral BID  . feeding supplement (ENSURE ENLIVE)  237 mL Oral BID BM  . furosemide  40 mg Oral Daily  . insulin aspart  0-5 Units Subcutaneous QHS  . insulin aspart  0-9 Units Subcutaneous TID WC  . magnesium oxide  400 mg Oral Daily  . mouth rinse  15 mL Mouth Rinse BID  . methocarbamol  500 mg Oral TID  . omega-3 acid ethyl esters  2 g Oral BID  . potassium chloride SA  40 mEq Oral Daily  . rivaroxaban  20 mg Oral Q supper  . senna-docusate  2 tablet Oral QHS  . sodium chloride flush  3 mL Intravenous Q12H   Continuous Infusions: . sodium chloride 250 mL (12/30/17 1724)  . amiodarone 30 mg/hr (12/31/17 0316)  . cefTRIAXone (ROCEPHIN)  IV 1 g (12/30/17 1728)  . doxycycline (VIBRAMYCIN) IV 100 mg (12/31/17 0457)     LOS: 3 days     Georgette Shell, MD [facility] If 7PM-7AM, please contact night-coverage [email or web address] Password [email or web address] 12/31/2017, 10:04 AM

## 2017-12-31 NOTE — Progress Notes (Signed)
PHARMACIST - PHYSICIAN COMMUNICATION  DR:   Rodena Piety  CONCERNING: IV to Oral Route Change Policy  RECOMMENDATION: This patient is receiving doxycycline by the intravenous route.  Based on criteria approved by the Pharmacy and Therapeutics Committee, the intravenous medication(s) is/are being converted to the equivalent oral dose form(s).   DESCRIPTION: These criteria include:  The patient is eating (either orally or via tube) and/or has been taking other orally administered medications for a least 24 hours  The patient has no evidence of active gastrointestinal bleeding or impaired GI absorption (gastrectomy, short bowel, patient on TNA or NPO).  If you have questions about this conversion, please contact the Pharmacy Department  []   [phone] )  [facility] []   [phone] )  [facility] [x]   [phone] )  [facility] []   [phone] )  [facility] []   [phone] )  [place], [place] 12/31/2017 1:25 PM

## 2017-12-31 NOTE — Progress Notes (Signed)
PT Cancellation Note  Patient Details Name: Jay Chen MRN: [id number] DOB: January 13, 1966   Cancelled Treatment:    Reason Eval/Treat Not Completed: (P) Patient at procedure or test/unavailable Pt off floor for MRI. PT will follow back this afternoon.  Tura Roller B. [place] Dk PT, DPT Acute Rehabilitation  [phone] Pager [phone]     [place] 12/31/2017, 10:39 AM

## 2017-12-31 NOTE — Progress Notes (Signed)
Patient refused CPAP for tonight 

## 2017-12-31 NOTE — Evaluation (Signed)
Physical Therapy Evaluation Patient Details Name: Jay Chen MRN: [id number] DOB: 1965-10-08 Today's Date: 12/31/2017   History of Present Illness  52 y.o. male with  pmhx relevant for systolic dysfunction CHF with an EF of 20 to 25%, diabetes, hypertension, hyperlipidemia, morbid obesity/OSA/pulmonary hypertension/right-sided heart failure who presents with right-sided flank pain and bilateral LE weakness. MRI of spine revealed broad-based disc bulge at L4-L5, moderate foraminal stenosis created by synovial cyst, and muscle edema in the multifidus muscle at the level of L4-L5, likely due to muscle strain. 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  Clinical Impression  PTA pt reports being independent in all aspects of mobility and iADLs. Pt currently limited by increased shooting pain down bilateral LE with movement. Pt requires maxAx2 for rolling to perform pericare and linen change. Given pt's independence prior to hospitalization as well as eagerness to move with less pain pt would be a good candidate for CIR. PT will continue to follow acutely until d/c.     Follow Up Recommendations CIR    Equipment Recommendations  Other (comment)(TBD)    Recommendations for Other Services Rehab consult     Precautions / Restrictions Precautions Precautions: Fall Restrictions Weight Bearing Restrictions: No      Mobility  Bed Mobility Overal bed mobility: Needs Assistance Bed Mobility: Rolling Rolling: Max assist;+2 for physical assistance         General bed mobility comments: maxAx2 for rolling to provide pericare and change linens                           Pertinent Vitals/Pain Pain Assessment: 0-10 Pain Score: 10-Worst pain ever Pain Location: B LE with movement Pain Descriptors / Indicators: Sharp;Shooting Pain Intervention(s): Limited activity within patient's tolerance;Monitored during session;Repositioned    Home Living  Family/patient expects to be discharged to:: Private residence Living Arrangements: Spouse/significant other;Children Available Help at Discharge: Family;Available PRN/intermittently Type of Home: House Home Access: Stairs to enter Entrance Stairs-Rails: None Entrance Stairs-Number of Steps: 3 Home Layout: Two level;Bed/bath upstairs;Able to live on main level with bedroom/bathroom        Prior Function Level of Independence: Independent         Comments: drives        Extremity/Trunk Assessment   Upper Extremity Assessment Upper Extremity Assessment: Overall WFL for tasks assessed    Lower Extremity Assessment Lower Extremity Assessment: RLE deficits/detail;LLE deficits/detail RLE Deficits / Details: limited P/AROM due to pain, hip ab/ad, knee ext, and plantar flexion with less pain than hip/knee flex and ankle dorsiflexion  RLE: Unable to fully assess due to pain RLE Sensation: WNL RLE Coordination: decreased fine motor;decreased gross motor LLE Deficits / Details: limited P/AROM due to pain, hip ab/ad, knee ext, and plantar flexion with less pain than hip/knee flex and ankle dorsiflexion  LLE: Unable to fully assess due to pain LLE Sensation: WNL LLE Coordination: decreased fine motor;decreased gross motor       Communication   Communication: No difficulties  Cognition Arousal/Alertness: Awake/alert Behavior During Therapy: WFL for tasks assessed/performed Overall Cognitive Status: Within Functional Limits for tasks assessed                                 General Comments: unaware that he was soaked in urine      General Comments General comments (skin integrity, edema, etc.): Pt in Afib  with HR during session ranged from 88-110bpm      Assessment/Plan    PT Assessment Patient needs continued PT services  PT Problem List Decreased range of motion;Decreased activity tolerance;Decreased mobility;Cardiopulmonary status limiting  activity;Pain       PT Treatment Interventions DME instruction;Gait training;Stair training;Functional mobility training;Therapeutic activities;Therapeutic exercise;Balance training;Neuromuscular re-education;Cognitive remediation;Patient/family education    PT Goals (Current goals can be found in the Care Plan section)  Acute Rehab PT Goals Patient Stated Goal: decrease pain with movement PT Goal Formulation: With patient Time For Goal Achievement: 01/14/18 Potential to Achieve Goals: Good    Frequency Min 2X/week    AM-PAC PT "6 Clicks" Daily Activity  Outcome Measure Difficulty turning over in bed (including adjusting bedclothes, sheets and blankets)?: Unable Difficulty moving from lying on back to sitting on the side of the bed? : Unable Difficulty sitting down on and standing up from a chair with arms (e.g., wheelchair, bedside commode, etc,.)?: Unable Help needed moving to and from a bed to chair (including a wheelchair)?: Total Help needed walking in hospital room?: Total Help needed climbing 3-5 steps with a railing? : Total 6 Click Score: 6    End of Session Equipment Utilized During Treatment: Oxygen Activity Tolerance: Patient limited by pain Patient left: in bed;with call bell/phone within reach;with bed alarm set Nurse Communication: Mobility status PT Visit Diagnosis: Pain;Muscle weakness (generalized) (M62.81);Other symptoms and signs involving the nervous system (R29.898) Pain - Right/Left: (Bilateral ) Pain - part of body: Leg;Knee;Hip;Ankle and joints of foot    Time: 1530-1605 PT Time Calculation (min) (ACUTE ONLY): 35 min   Charges:   PT Evaluation $PT Eval Moderate Complexity: 1 Mod PT Treatments $Therapeutic Activity: 8-22 mins        Rosalinda Seaman B. Migdalia Dk PT, DPT Acute Rehabilitation  [phone] Pager [phone]    [place] 12/31/2017, 5:29 PM

## 2017-12-31 NOTE — Progress Notes (Signed)
Progress Note  Patient Name: Jay Chen Date of Encounter: 12/31/2017  Primary Cardiologist: Quay Burow, MD   Subjective   Denies any chest pain or SOB.  Remains in atrial fibrillation with CVR on IV Amio  Inpatient Medications    Scheduled Meds: . atorvastatin  40 mg Oral Daily  . carvedilol  6.25 mg Oral BID  . feeding supplement (ENSURE ENLIVE)  237 mL Oral BID BM  . furosemide  40 mg Oral Daily  . insulin aspart  0-5 Units Subcutaneous QHS  . insulin aspart  0-9 Units Subcutaneous TID WC  . magnesium oxide  400 mg Oral Daily  . mouth rinse  15 mL Mouth Rinse BID  . methocarbamol  500 mg Oral TID  . omega-3 acid ethyl esters  2 g Oral BID  . potassium chloride SA  40 mEq Oral Daily  . rivaroxaban  20 mg Oral Q supper  . senna-docusate  2 tablet Oral QHS  . sodium chloride flush  3 mL Intravenous Q12H   Continuous Infusions: . sodium chloride 250 mL (12/30/17 1724)  . amiodarone 30 mg/hr (12/31/17 0316)  . cefTRIAXone (ROCEPHIN)  IV 1 g (12/30/17 1728)  . doxycycline (VIBRAMYCIN) IV 100 mg (12/31/17 0457)   PRN Meds: sodium chloride, acetaminophen **OR** acetaminophen, albuterol, ondansetron **OR** ondansetron (ZOFRAN) IV, oxyCODONE, polyethylene glycol, sodium chloride flush, traZODone   Vital Signs    Vitals:   12/31/17 0008 12/31/17 0020 12/31/17 0525 12/31/17 0746  BP:  106/78 104/74   Pulse:  94 96   Resp:  (!) 23 16   Temp: 98.7 F (37.1 C)  98.8 F (37.1 C)   TempSrc: Oral  Oral   SpO2:  92% 92%   Weight:    (!) 157.6 kg  Height:        Intake/Output Summary (Last 24 hours) at 12/31/2017 1013 Last data filed at 12/31/2017 1002 Gross per 24 hour  Intake 1877.79 ml  Output 950 ml  Net 927.79 ml   Filed Weights   12/29/17 0500 12/30/17 0631 12/31/17 0746  Weight: (!) 159.5 kg (!) 157 kg (!) 157.6 kg    Telemetry    Atrial fibrillation with CVR- Personally Reviewed  ECG    No new EKG to review- Personally Reviewed  Physical Exam    GEN: Well nourished, well developed in no acute distress HEENT: Normal NECK: No JVD; No carotid bruits LYMPHATICS: No lymphadenopathy CARDIAC:irregularly irregular, no murmurs, rubs, gallops RESPIRATORY:  Clear to auscultation without rales, wheezing or rhonchi  ABDOMEN: Soft, non-tender, non-distended MUSCULOSKELETAL:  No edema; No deformity  SKIN: Warm and dry NEUROLOGIC:  Alert and oriented x 3 PSYCHIATRIC:  Normal affect    Labs    Chemistry Recent Labs  Lab 12/29/17 0511 12/30/17 0550 12/31/17 0443  NA 136 137 138  K 3.1* 3.4* 3.6  CL 90* 90* 92*  CO2 31 34* 33*  GLUCOSE 162* 151* 139*  BUN 61* 60* 57*  CREATININE 2.60* 2.31* 2.18*  CALCIUM 8.7* 9.0 9.1  GFRNONAA 26* 31* 33*  GFRAA 31* 35* 38*  ANIONGAP 15 13 13      Hematology Recent Labs  Lab 12/27/17 2354 12/29/17 0511 12/31/17 0443  WBC 9.8 10.1 9.1  RBC 4.44 4.08* 4.15*  HGB 13.9 12.9* 13.0  HCT 41.9 38.4* 39.9  MCV 94.4 94.1 96.1  MCH 31.3 31.6 31.3  MCHC 33.2 33.6 32.6  RDW 13.6 13.5 13.8  PLT 242 216 227    Cardiac EnzymesNo results  for input(s): TROPONINI in the last 168 hours. No results for input(s): TROPIPOC in the last 168 hours.   BNP Recent Labs  Lab 12/27/17 2354  BNP 283.3*     DDimer No results for input(s): DDIMER in the last 168 hours.   Radiology    Dg Chest 1 View  Result Date: 12/29/2017 CLINICAL DATA:  Follow-up exam. Requiring increased oxygen to keep O2 sats up. EXAM: CHEST  1 VIEW COMPARISON:  Chest x-ray dated 12/28/2016. FINDINGS: Stable cardiomegaly. Probable opacity at the LEFT lung base, difficult to characterize due to the overlying heart shadow. Persistent vascular congestion within the perihilar regions. RIGHT lung otherwise clear. No pneumothorax seen. IMPRESSION: 1. Cardiomegaly with central pulmonary vascular congestion suggesting mild CHF/volume overload. 2. Suspected opacity at the LEFT lung base, difficult to characterize due to the overlying heart.  Cannot exclude pneumonia, atelectasis and/or pleural effusion at the LEFT lung base. Electronically Signed   By: Franki Cabot M.D.   On: 12/29/2017 19:25    Cardiac Studies   2D echo 10/31/2017 Study Conclusions  - Left ventricle: The cavity size was moderately to severely   dilated. Wall thickness was increased in a pattern of moderate   LVH. Systolic function was severely reduced. The estimated   ejection fraction was in the range of 20% to 25%. Diffuse   hypokinesis. The study is not technically sufficient to allow   evaluation of LV diastolic function. - Left atrium: The atrium was severely dilated. - Right ventricle: The cavity size was dilated. Systolic function   was moderately reduced. - Right atrium: The atrium was dilated. - Pericardium, extracardiac: A trivial pericardial effusion was   identified posterior to the heart. - Impressions: Technically very limited study due to extremely poor   sound wave transmission. There appears to be significant   buventricular dysfunction.  Impressions:  - Technically very limited study due to extremely poor sound wave   transmission. There appears to be significant buventricular   dysfunction.  Patient Profile     52 y.o. male with a hx of morbid obesity, OSA intolerant of CPAP, cor pulmonale, DM, HTN, HLD, 4.8cm thoracic aortic aneurysm by CT 2013 who is being seen for the evaluation of CHF med adjustment at the request of Dr. Denton Brick.  Assessment & Plan    1. Right flank pain -etiology unclear, further evaluation per internal medicine.  -afebrile today -UA negative -2 mm nonobstructing renal stone in lower pole of right kidney by CT -also has new bilateral LE weakness - MRI pending today -per [facility]  2. Acute kidney injury superimposed on suspected CKD stage III  -likely secondary to intravascular volume depletion -Creatinine slowly improving  (2.72 on admission >2.6>2.31>2.18)  -started back on Lasix 40mg  daily  yesterday  3. 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   -His BNP is mildly elevated but he has severe LV dysfunction and likely has chronically elevated BNP especially in setting of acute on CKD -weight down 6kg from admit -He denies any shortness of breath or worsening lower extremity edema -Chest x-ray suggested some edema, but the lower chest findings from his CT scan only showed atelectasis and  scarring.   -he put out 850cc yesterday and net + 506cc after Lasix 40mg  PO yesterday -Creatinine continues to improve  -Continue to hold [place] for now.  -Clonidine stopped to allow higher BP for  better renal perfusion -continue Carvedilol to help with afib rate control -will need referral to AHF clinic at discharge  4. Persistent atrial fibrillation  -Remains  in atrial fibrillation but HR much improved on  IV Amio -Continue carvedilol 6.2 5 mg twice daily.  Cannot titrate up due to soft BP -It does not appear there have been any plans to restore sinus rhythm.   -Given his severe morbid obesity and untreated sleep apnea as well as severely dilated left atrium, he would be unlikely to hold sinus without antiarrhythmic therapy.   -change Amio to 200mg  BID -Xarelto dose has been adjusted for acute renal insufficiency. -consider DCCV prior to d/c  5. Morbid obesity, with probable OHS (and OSA unable to tolerate CPAP) - long term weight loss will be essential for his health. He is requiring nasal cannula here in the hospital. Old pulm notes from 2017 indicate O2 sat of 90% on RA.  6. Known thoracic aortic aneurysm - long overdue for follow-up.  -Given AKI cannot do CTA right now to reassess.  -UE pulses are equal bilaterally and no murmurs suggestive of AI.  -will need to be reassessed once renal function improves and able to do  Cardiac CTA  7.  Hypokalemia -Replete per [facility] -K+ 3.6 today -replete per [facility]   For questions or updates, please contact [place] Please consult [email or web address] for contact info under Cardiology/STEMI.      Signed, Fransico Him, MD  12/31/2017, 10:13 AM

## 2018-01-01 ENCOUNTER — Inpatient Hospital Stay (HOSPITAL_COMMUNITY): Payer: BC Managed Care – PPO

## 2018-01-01 DIAGNOSIS — I6789 Other cerebrovascular disease: Secondary | ICD-10-CM

## 2018-01-01 DIAGNOSIS — I6340 Cerebral infarction due to embolism of unspecified cerebral artery: Secondary | ICD-10-CM

## 2018-01-01 DIAGNOSIS — G9520 Unspecified cord compression: Secondary | ICD-10-CM

## 2018-01-01 DIAGNOSIS — I509 Heart failure, unspecified: Secondary | ICD-10-CM

## 2018-01-01 DIAGNOSIS — I639 Cerebral infarction, unspecified: Secondary | ICD-10-CM

## 2018-01-01 DIAGNOSIS — M5416 Radiculopathy, lumbar region: Secondary | ICD-10-CM

## 2018-01-01 LAB — BASIC METABOLIC PANEL
ANION GAP: 11 (ref 5–15)
BUN: 54 mg/dL — ABNORMAL HIGH (ref 6–20)
CO2: 34 mmol/L — ABNORMAL HIGH (ref 22–32)
Calcium: 8.9 mg/dL (ref 8.9–10.3)
Chloride: 93 mmol/L — ABNORMAL LOW (ref 98–111)
Creatinine, Ser: 1.98 mg/dL — ABNORMAL HIGH (ref 0.61–1.24)
GFR, EST AFRICAN AMERICAN: 43 mL/min — AB (ref >60)
GFR, EST NON AFRICAN AMERICAN: 37 mL/min — AB (ref >60)
GLUCOSE: 121 mg/dL — AB (ref 70–99)
POTASSIUM: 3.7 mmol/L (ref 3.5–5.1)
Sodium: 138 mmol/L (ref 135–145)

## 2018-01-01 LAB — CBC WITH DIFFERENTIAL/PLATELET
ABS IMMATURE GRANULOCYTES: 0 10*3/uL (ref 0.0–0.1)
Basophils Absolute: 0 10*3/uL (ref 0.0–0.1)
Basophils Relative: 0 %
Eosinophils Absolute: 0.2 10*3/uL (ref 0.0–0.7)
Eosinophils Relative: 3 %
HEMATOCRIT: 38.4 % — AB (ref 39.0–52.0)
Hemoglobin: 12.4 g/dL — ABNORMAL LOW (ref 13.0–17.0)
IMMATURE GRANULOCYTES: 0 %
LYMPHS ABS: 1.1 10*3/uL (ref 0.7–4.0)
Lymphocytes Relative: 13 %
MCH: 31.1 pg (ref 26.0–34.0)
MCHC: 32.3 g/dL (ref 30.0–36.0)
MCV: 96.2 fL (ref 78.0–100.0)
MONOS PCT: 13 %
Monocytes Absolute: 1.1 10*3/uL — ABNORMAL HIGH (ref 0.1–1.0)
NEUTROS ABS: 5.7 10*3/uL (ref 1.7–7.7)
NEUTROS PCT: 71 %
PLATELETS: 234 10*3/uL (ref 150–400)
RBC: 3.99 MIL/uL — AB (ref 4.22–5.81)
RDW: 13.6 % (ref 11.5–15.5)
WBC: 8.1 10*3/uL (ref 4.0–10.5)

## 2018-01-01 LAB — LIPID PANEL
CHOL/HDL RATIO: 4.8 ratio
CHOLESTEROL: 116 mg/dL (ref 0–200)
HDL: 24 mg/dL — ABNORMAL LOW (ref >40)
LDL Cholesterol: 75 mg/dL (ref 0–99)
TRIGLYCERIDES: 85 mg/dL (ref <150)
VLDL: 17 mg/dL (ref 0–40)

## 2018-01-01 LAB — GLUCOSE, CAPILLARY
Glucose-Capillary: 112 mg/dL — ABNORMAL HIGH (ref 70–99)
Glucose-Capillary: 149 mg/dL — ABNORMAL HIGH (ref 70–99)
Glucose-Capillary: 229 mg/dL — ABNORMAL HIGH (ref 70–99)
Glucose-Capillary: 178 mg/dL — ABNORMAL HIGH (ref 70–99)

## 2018-01-01 LAB — ECHOCARDIOGRAM COMPLETE
HEIGHTINCHES: 68 in
Weight: 5559.12 oz

## 2018-01-01 MED ORDER — ATORVASTATIN CALCIUM 80 MG PO TABS
80.0000 mg | ORAL_TABLET | Freq: Every day | ORAL | Status: DC
  Administered 2018-01-02 – 2018-01-04 (×3): 80 mg via ORAL
  Filled 2018-01-01 (×3): qty 1

## 2018-01-01 MED ORDER — DEXAMETHASONE SODIUM PHOSPHATE 10 MG/ML IJ SOLN
10.0000 mg | Freq: Once | INTRAMUSCULAR | Status: AC
  Administered 2018-01-01: 10 mg via INTRAVENOUS
  Filled 2018-01-01: qty 1

## 2018-01-01 MED ORDER — PERFLUTREN LIPID MICROSPHERE
4.0000 mL | Freq: Once | INTRAVENOUS | Status: AC
  Administered 2018-01-01: 10 mL via INTRAVENOUS

## 2018-01-01 NOTE — Progress Notes (Signed)
Pt desaturating through the night, importance of CPAP explained to pt. Pt refused CPAP. He also refused to be repositioned

## 2018-01-01 NOTE — Progress Notes (Signed)
Progress Note  Patient Name: Jay Chen Date of Encounter: 01/01/2018  Primary Cardiologist: Quay Burow, MD   Subjective   No CP or SOB.  Remains in afib with CVR now on PO Amio  Inpatient Medications    Scheduled Meds: . amiodarone  200 mg Oral BID  . atorvastatin  40 mg Oral Daily  . carvedilol  6.25 mg Oral BID  . doxycycline  100 mg Oral Q12H  . feeding supplement (ENSURE ENLIVE)  237 mL Oral BID BM  . furosemide  40 mg Oral Daily  . insulin aspart  0-5 Units Subcutaneous QHS  . insulin aspart  0-9 Units Subcutaneous TID WC  . mouth rinse  15 mL Mouth Rinse BID  . methocarbamol  500 mg Oral TID  . omega-3 acid ethyl esters  2 g Oral BID  . potassium chloride SA  40 mEq Oral Daily  . rivaroxaban  20 mg Oral Q supper  . senna-docusate  2 tablet Oral QHS  . sodium chloride flush  3 mL Intravenous Q12H   Continuous Infusions: . sodium chloride 250 mL (12/30/17 1724)  . cefTRIAXone (ROCEPHIN)  IV 2 g (12/31/17 1502)   PRN Meds: sodium chloride, acetaminophen **OR** acetaminophen, albuterol, ondansetron **OR** ondansetron (ZOFRAN) IV, oxyCODONE, polyethylene glycol, sodium chloride flush   Vital Signs    Vitals:   12/31/17 2340 12/31/17 2348 12/31/17 2350 01/01/18 0510  BP: (!) 85/55 98/76 115/82 124/84  Pulse: 95  (!) 51 72  Resp: 18  (!) 21 12  Temp:  99.5 F (37.5 C)  99 F (37.2 C)  TempSrc:  Oral  Oral  SpO2: 94%  91% 96%  Weight:      Height:        Intake/Output Summary (Last 24 hours) at 01/01/2018 1148 Last data filed at 01/01/2018 0517 Gross per 24 hour  Intake 772.77 ml  Output 1300 ml  Net -527.23 ml   Filed Weights   12/29/17 0500 12/30/17 0631 12/31/17 0746  Weight: (!) 159.5 kg (!) 157 kg (!) 157.6 kg    Telemetry    afib with CVR- Personally Reviewed  ECG    No new EKG to review- Personally Reviewed  Physical Exam   GEN: Well nourished, well developed in no acute distress HEENT: Normal NECK: No JVD; No carotid  bruits LYMPHATICS: No lymphadenopathy CARDIAC:irregluarly irregular, no murmurs, rubs, gallops RESPIRATORY:  Clear to auscultation without rales, wheezing or rhonchi  ABDOMEN: Soft, non-tender, non-distended MUSCULOSKELETAL:  No edema; No deformity  SKIN: Warm and dry NEUROLOGIC:  Alert and oriented x 3 PSYCHIATRIC:  Normal affect    Labs    Chemistry Recent Labs  Lab 12/30/17 0550 12/31/17 0443 01/01/18 0159  NA 137 138 138  K 3.4* 3.6 3.7  CL 90* 92* 93*  CO2 34* 33* 34*  GLUCOSE 151* 139* 121*  BUN 60* 57* 54*  CREATININE 2.31* 2.18* 1.98*  CALCIUM 9.0 9.1 8.9  GFRNONAA 31* 33* 37*  GFRAA 35* 38* 43*  ANIONGAP 13 13 11      Hematology Recent Labs  Lab 12/29/17 0511 12/31/17 0443 01/01/18 0159  WBC 10.1 9.1 8.1  RBC 4.08* 4.15* 3.99*  HGB 12.9* 13.0 12.4*  HCT 38.4* 39.9 38.4*  MCV 94.1 96.1 96.2  MCH 31.6 31.3 31.1  MCHC 33.6 32.6 32.3  RDW 13.5 13.8 13.6  PLT 216 227 234    Cardiac EnzymesNo results for input(s): TROPONINI in the last 168 hours. No results for input(s): TROPIPOC  in the last 168 hours.   BNP Recent Labs  Lab 12/27/17 2354  BNP 283.3*     DDimer No results for input(s): DDIMER in the last 168 hours.   Radiology    Mr Jodene Nam Head Wo Contrast  Result Date: 01/01/2018 CLINICAL DATA:  Stroke follow-up EXAM: MRA HEAD WITHOUT CONTRAST TECHNIQUE: Angiographic images of the Circle of Willis were obtained using MRA technique without intravenous contrast. COMPARISON:  Brain MRI yesterday FINDINGS: Symmetric carotid and vertebral arteries. Vessels are smooth and widely patent. Negative for aneurysm. IMPRESSION: Negative intracranial MRA. Electronically Signed   By: Monte Fantasia M.D.   On: 01/01/2018 08:32   Mr Brain Wo Contrast  Result Date: 12/31/2017 CLINICAL DATA:  Generalized weakness. EXAM: MRI HEAD WITHOUT CONTRAST TECHNIQUE: Multiplanar, multiecho pulse sequences of the brain and surrounding structures were obtained without intravenous  contrast. COMPARISON:  Head CT 02/08/2015 FINDINGS: Brain: There is a small acute white matter infarct in the posterosuperior left temporal lobe adjacent to the lateral ventricle measuring 1.2 cm in maximal dimension. Multiple chronic microhemorrhages are present in both cerebral hemispheres as well as in the right thalamus and right lentiform nucleus. Small foci of T2 hyperintensity in the cerebral white matter bilaterally are nonspecific but compatible with mild chronic small vessel ischemic disease. The ventricles and sulci are normal for age. No mass, midline shift, or extra-axial fluid collection is seen. Vascular: Major intracranial vascular flow voids are preserved. Skull and upper cervical spine: Unremarkable bone marrow signal. Sinuses/Orbits: Unremarkable orbits. Right larger than left maxillary sinus mucous retention cysts. Clear mastoid air cells. Other: None. IMPRESSION: 1. Small acute posterior left temporal lobe infarct. 2. Mild chronic small vessel ischemic disease and scattered chronic microhemorrhages. Electronically Signed   By: Logan Bores M.D.   On: 12/31/2017 11:30   Mr Cervical Spine Wo Contrast  Result Date: 01/01/2018 CLINICAL DATA:  Painful myelopathy EXAM: MRI CERVICAL SPINE WITHOUT CONTRAST TECHNIQUE: Multiplanar, multisequence MR imaging of the cervical spine was performed. No intravenous contrast was administered. COMPARISON:  Cervical spine CT 09/27/2009 FINDINGS: Alignment: Reversal of cervical lordosis with 1-2 mm of C3-4 anterolisthesis. Vertebrae: No fracture, evidence of discitis, or bone lesion. Cord: T2 hyperintensity about the compressed cord at C4-5. Posterior Fossa, vertebral arteries, paraspinal tissues: Trace prevertebral edema at C3 and C4, possibly reflecting recent injury. There is no indication of underlying bony infection or discitis. Disc levels: C2-3: Facet spurring.  Negative disc.  Foraminal narrowing is mild. C3-4: Facet spurring asymmetric to the left with  mild anterolisthesis. Mild uncovertebral spurring. Foramina are patent. C4-5: Disc narrowing with central protrusion compressing the edematous cord. Cord compression is severe, with a 2 mm canal diameter in the midline. Disc height loss and uncovertebral spurring causes right more than left foraminal impingement C5-6: Disc narrowing and endplate degeneration with bulge compressing the cord. Bilateral foraminal impingement from disc height loss and uncovertebral spurring. C6-7: Disc narrowing and endplate degeneration with posterior disc osteophyte complex contacting the ventral cord. Mild bilateral foraminal narrowing C7-T1:Minor facet spurring.  Mild left foraminal stenosis. Case discussed with Dr. Erlinda Hong via telephone at time of signing IMPRESSION: 1. C4-5 severe cord compression due to disc protrusion, with cord edema. 2. C5-6 degenerative cord compression. 3. C6-7 mild degenerative spinal stenosis. 4. Slight prevertebral edema, question recent trauma. No evidence of discitis. 5. Bilateral foraminal impingement at C4-5 and C5-6. Electronically Signed   By: Monte Fantasia M.D.   On: 01/01/2018 08:07   Mr Thoracic Spine Wo Contrast  Result Date: 01/01/2018 CLINICAL DATA:  Acute or progressive myelopathy. Worsening weakness in both legs in severe pain. EXAM: MRI THORACIC SPINE WITHOUT CONTRAST TECHNIQUE: Multiplanar, multisequence MR imaging of the thoracic spine was performed. No intravenous contrast was administered. COMPARISON:  None. FINDINGS: Alignment:  Normal thoracic alignment. Counting sequence of the cervical spine shows C3-4 facet mediated anterolisthesis and advanced disc degeneration at C4-5, C5-6, and C6-7. per the time line a cervical MRI has been ordered. Vertebrae: No fracture, evidence of discitis, or bone lesion. Cord:  Normal signal and morphology Paraspinal and other soft tissues: Intrinsic back muscle edema minimally seen in the lower thoracic spine, continuation of lumbar spine pathology as  described previously. Disc levels: Generalized spondylosis. Disc height and hydration is well preserved. No notable facet arthropathy. Negative for cord impingement. There is symmetric diffuse foraminal patency. IMPRESSION: No acute finding.  No impingement or visible myelopathy. Electronically Signed   By: Monte Fantasia M.D.   On: 01/01/2018 07:49   Mr Lumbar Spine Wo Contrast  Result Date: 12/31/2017 CLINICAL DATA:  Right flank pain for several days EXAM: MRI LUMBAR SPINE WITHOUT CONTRAST TECHNIQUE: Multiplanar, multisequence MR imaging of the lumbar spine was performed. No intravenous contrast was administered. COMPARISON:  None. FINDINGS: Segmentation:  Standard. Alignment:  Physiologic. Vertebrae:  No fracture, evidence of discitis, or bone lesion. Conus medullaris and cauda equina: Conus extends to the T12 level. Conus and cauda equina appear normal. Paraspinal and other soft tissues: No acute paraspinal abnormality. There is muscle edema in the multifidus muscle at the level of L4-5, left greater than right likely reflecting muscle strain versus an inflammatory etiology. Disc levels: Disc spaces: Disc spaces are maintained. T12-L1: No significant disc bulge. No evidence of neural foraminal stenosis. No central canal stenosis. L1-L2: No significant disc bulge. No evidence of neural foraminal stenosis. No central canal stenosis. L2-L3: No significant disc bulge. No evidence of neural foraminal stenosis. No central canal stenosis. L3-L4: Minimal broad-based disc bulge. No evidence of neural foraminal stenosis. No central canal stenosis. L4-L5: Broad-based disc bulge. Moderate bilateral facet arthropathy with facet effusions. Moderate spinal stenosis and bilateral lateral recess stenosis. Moderate left foraminal stenosis. Mild right foraminal stenosis. L5-S1: No significant disc bulge. No central canal stenosis. Moderate right facet arthropathy. 9 mm right facet synovial cyst projecting into the right  neural foramen and resulting in moderate foraminal stenosis. IMPRESSION: 1. At L4-5 there is a broad-based disc bulge. Moderate bilateral facet arthropathy with facet effusions. Moderate spinal stenosis and bilateral lateral recess stenosis. Moderate left foraminal stenosis. Mild right foraminal stenosis. 2. At L5-S1 there is moderate right facet arthropathy. 9 mm right facet synovial cyst projecting into the right neural foramen and resulting in moderate foraminal stenosis. 3. There is muscle edema in the multifidus muscle at the level of L4-5, left greater than right likely reflecting muscle strain versus an inflammatory etiology. Electronically Signed   By: Kathreen Devoid   On: 12/31/2017 12:34    Cardiac Studies   2D echo 10/31/2017 Study Conclusions  - Left ventricle: The cavity size was moderately to severely   dilated. Wall thickness was increased in a pattern of moderate   LVH. Systolic function was severely reduced. The estimated   ejection fraction was in the range of 20% to 25%. Diffuse   hypokinesis. The study is not technically sufficient to allow   evaluation of LV diastolic function. - Left atrium: The atrium was severely dilated. - Right ventricle: The cavity size was dilated. Systolic  function   was moderately reduced. - Right atrium: The atrium was dilated. - Pericardium, extracardiac: A trivial pericardial effusion was   identified posterior to the heart. - Impressions: Technically very limited study due to extremely poor   sound wave transmission. There appears to be significant   buventricular dysfunction.  Impressions:  - Technically very limited study due to extremely poor sound wave   transmission. There appears to be significant buventricular   dysfunction.  Patient Profile     52 y.o. male with a hx of morbid obesity, OSA intolerant of CPAP, cor pulmonale, DM, HTN, HLD, 4.8cm thoracic aortic aneurysm by CT 2013 who is being seen for the evaluation of CHF med  adjustment at the request of Dr. Denton Brick.  Assessment & Plan    1. Right flank pain -etiology unclear, further evaluation per internal medicine.  -afebrile today -UA negative -2 mm nonobstructing renal stone in lower pole of right kidney by CT -also has new bilateral LE weakness - MRI with spinal stenosis and disc bulge at L4-L5 -per TRH  2. Acute kidney injury superimposed on suspected CKD stage III  -likely secondary to intravascular volume depletion -Creatinine slowly improving  (2.72 on admission >2.6>2.31>2.18>1.98)  -started back on Lasix 40mg  daily   3. 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   -His BNP is mildly elevated but he has severe LV dysfunction and likely has chronically elevated BNP especially in setting of acute on CKD -weight down 6kg from admit but no change in 3 days -He denies any shortness of breath or worsening lower extremity edema -Chest x-ray suggested some edema, but the lower chest findings from his CT scan only showed atelectasis and  scarring.   -he put out 1.5L yesterday and net -20cc  -Creatinine continues to improve  -Continue to hold [place] for now.  -Clonidine stopped to allow higher BP for better renal perfusion -continue Carvedilol to help with afib rate control -will need referral to AHF clinic at discharge  4. Persistent atrial fibrillation  -Remains  in atrial fibrillation with CVR on PO Amio -Continue carvedilol 6.2 5 mg twice daily and Amio 200mg  BID.   -It does not appear there have been any plans to restore sinus rhythm.   -Given his severe morbid obesity and untreated sleep apnea as well as severely dilated left atrium, he would be unlikely to hold sinus without antiarrhythmic therapy.   -Xarelto dose has been adjusted for acute renal insufficiency. -consider DCCV prior  to d/c but will wait to see if anything needs to be done regarding findings on MRI of spine as he would not be able to come off anticoagulation for 4 weeks after DCCV  5. Morbid obesity, with probable OHS (and OSA unable to tolerate CPAP) - long term weight loss will be essential for his health. He is requiring nasal cannula here in the hospital. Old pulm notes from 2017 indicate O2 sat of 90% on RA.  6. Known thoracic aortic aneurysm - long overdue for follow-up.  -Given AKI cannot do CTA right now to reassess.  -UE pulses are equal bilaterally and no murmurs suggestive of AI.  -will need to be reassessed once renal function improves and able to do Cardiac CTA  7.  Hypokalemia -Replete per TRH -K+ 3.7 today    For questions or updates, please contact [place] Please consult [email or web address] for  contact info under Cardiology/STEMI.      Signed, Fransico Him, MD  01/01/2018, 11:48 AM

## 2018-01-01 NOTE — Progress Notes (Signed)
VASCULAR LAB PRELIMINARY  PRELIMINARY  PRELIMINARY  PRELIMINARY  Carotid duplex completed.    Preliminary report:  1-39% ICA plaquing.  Right vertebral artery flow is antegrade.  Unable to insonate left vertebral artery secondary to body habitus.  Sybol Morre, RVT 01/01/2018, 2:37 PM

## 2018-01-01 NOTE — Consult Note (Signed)
Chief Complaint   Chief Complaint  Patient presents with  . Flank Pain    History of Present Illness  Jay Chen is a 52 y.o. male admitted with right-sided flank pain, although he tells me that he actually has primarily right-sided low back pain.  He says the pain started without any identifiable inciting event this past Tuesday.  He says the pain prevented him from getting up out of bed.  He does complain of radiation of the pain initially into the right leg, although he now complains of radiating pain into both legs, involving the posterior thigh and calf.  He says moving his legs is very painful.  He does tell me that when he is lying flat, stationary he has minimal pain.  Upon questioning, he does not report any symptoms of urinary incontinence or retention.  He also does not report any upper extremity symptoms, denying any numbness, tingling, weakness, or incoordination.  Of note, he does have significant medical history as below, including CHF, atrial fibrillation on Xarelto, chronic kidney disease, and morbid obesity.  Past Medical History   Past Medical History:  Diagnosis Date  . CKD (chronic kidney disease), stage III ([place])   . Congestive heart failure [facility]) May of 2011   Felt to have cor pulmonale; EF 45 to 50% from echo in May 2011  . Cor pulmonale (chronic) ([place])   . Gout    "take daily RX" (12/28/2017)  . Hyperlipidemia   . Hypertension   . Morbid obesity ([place])   . Persistent atrial fibrillation ([place])    [name] 12/28/2017  . Sleep apnea    "dx'd; couldn't tolerate CPAP" (12/28/2017)  . Thoracic aneurysm    a. 4.8cm thoracic aortic aneurysm by CT 2013.  [name] Thoracic aortic aneurysm ([place])    known/notes 12/28/2017  . Type II diabetes mellitus ([place])     Past Surgical History   Past Surgical History:  Procedure Laterality Date  . LAPAROSCOPIC CHOLECYSTECTOMY  2000  . US ECHOCARDIOGRAPHY  09/21/2009   EF 45-50%; Cavity size was severely dilated, severe concentric  hypertrophy and normal wall motion    Social History   Social History   Tobacco Use  . Smoking status: Never Smoker  . Smokeless tobacco: Never Used  Substance Use Topics  . Alcohol use: Not Currently    Comment: 12/28/2017 "nothing in the last couple years"  . Drug use: Never    Medications   Prior to Admission medications   Medication Sig Start Date End Date Taking? Authorizing Provider  albuterol (PROVENTIL HFA;VENTOLIN HFA) 108 (90 BASE) MCG/ACT inhaler Inhale 2 puffs into the lungs every 6 (six) hours as needed for wheezing or shortness of breath.   Yes [provider]  allopurinol (ZYLOPRIM) 100 MG tablet Take 1 tablet (100 mg total) by mouth daily. 09/02/17  Yes [name], MD  atorvastatin (LIPITOR) 40 MG tablet Take 1 tablet (40 mg total) by mouth daily. 10/14/17  Yes [name], MD  carvedilol (COREG) 6.25 MG tablet Take 1 tablet (6.25 mg total) by mouth 2 (two) times daily. 11/22/17  Yes [name], MD  cloNIDine (CATAPRES) 0.1 MG tablet Take 1 tab in the am and 2 tab every pm. Patient taking differently: Take 0.1-0.2 mg by mouth See admin instructions. Take 1 tablet every morning and take 2 tablets every evening 10/14/17  Yes [name], MD  dapagliflozin propanediol (FARXIGA) 5 MG TABS tablet Take 5 mg by mouth daily. 09/02/17  Yes [name], MD  furosemide (LASIX) 40 MG tablet Take 1 tablet (40 mg total) by mouth daily. 11/22/17 02/20/18 Yes [name], MD  magnesium oxide (MAG-OX) 400 MG tablet Take 1 tablet (400 mg total) by mouth daily. 10/12/16  Yes [name], MD  metFORMIN (GLUCOPHAGE) 1000 MG tablet Take 1 tablet (1,000 mg total) by mouth 2 (two) times daily with a meal. 09/02/17  Yes [name], MD  omega-3 acid ethyl esters (LOVAZA) 1 G capsule Take 2 capsules (2 g total) by mouth 2 (two) times daily. 07/18/13  Yes [name], MD  potassium chloride SA (KLOR-CON M20) 20 MEQ tablet Take 2 tablets (40 mEq total) by mouth  daily. 09/02/17  Yes [name], MD  rivaroxaban (XARELTO) 20 MG TABS tablet Take 1 tablet (20 mg total) by mouth daily with supper. 09/01/17  Yes [name], MD  sacubitril-valsartan (ENTRESTO) 24-26 MG Take 1 tablet by mouth 2 (two) times daily. 11/07/17  Yes Lendon Colonel, NP  Exenatide ER (BYDUREON BCISE) 2 MG/0.85ML AUIJ Inject 1 Act into the skin once a week. Patient not taking: Reported on 12/28/2017 09/02/17   [name], MD  glucose blood (BAYER CONTOUR NEXT TEST) test strip Use BID 09/28/12   [name], MD  Lancets Misc. Patricia Pesa 1) MISC Test up to twice daily 09/28/12   [name], MD    Allergies  No Known Allergies  Review of Systems  ROS  Neurologic Exam  Awake, alert, oriented Memory and concentration grossly intact Speech fluent, appropriate CN grossly intact Motor exam: Upper Extremities Deltoid Bicep Tricep Grip  Right 5/5 5/5 5/5 5/5  Left 5/5 5/5 5/5 5/5   Lower Extremities IP Quad PF DF EHL  Right 5/5 5/5 4/5 4/5 5/5  Left 5/5 5/5 4/5 4/5 5/5  (+) Hoffman's bilaterally (+) pectoralis reflex bilaterally Sensation grossly intact to LT  Imaging  MRI of the cervical spine was reviewed.  This does demonstrate slight kyphotic deformity centered about the C5 vertebral body.  There is a large central to left eccentric disc protrusion at C4-5 with severe central stenosis and associated intrinsic T2 signal change.  There is moderate to severe stenosis behind the body of C5, including a relatively wide C5-6 disc bulge and stenosis at this level also.  MRI of the lumbar spine was reviewed.  There is maintenance of normal lumbar lordosis.  Primary finding is at L4-5 where there is significant facet arthropathy with joint diastases, and some primarily lateral recess stenosis bilaterally.  Impression  - 52 y.o. male with acute onset of low back and now bilateral leg pain.  His symptoms seem more related to his lumbar spondylitic disease.  His MRI does  demonstrate significant facet arthropathy with joint diastases, and I suspect that he actually does harbor a mobile spondylolisthesis at this level which would explain his back and leg pain.  While he does not appear to have clinical symptoms of cervical myelopathy, his MRI does demonstrate severe radiographic stenosis with spinal cord compression and spinal cord signal change.  Plan  -At this point I would treat his low back and leg pain likely related to the lumbar spondylosis conservatively.  Could try a short course of narcotic pain medicine, with prednisone Dosepak, and possibly NSAIDs, although this may not be an option with his chronic kidney disease. -As he is on Xarelto, and he is essentially asymptomatic from his cervical stenosis, I would plan  on addressing his neck on an outpatient basis.  He is going to need surgical decompression via C5 corpectomy however we can schedule this on an outpatient basis when he is safely able to come off Xarelto.  I have discussed this plan with the patient, as well as the neurology service. All the patient's questions today were answered.

## 2018-01-01 NOTE — Consult Note (Addendum)
Requesting Physician: Dr. Rodena Piety    Chief Complaint: Bilateral leg weakness  History obtained from: Patient and Chart     HPI:                                                                                                                                       Jay Chen is an 52 y.o. male with Afib with RVR, CKD, systolic heart failure with EF of 20 to 25%, hypertension, diabetes, morbid obesity admitted to the hospital after presenting with right flank pain secondary due to renal calculi. Recent also being treated for A. fib with RVR and was started on Xarelto and amiodarone drip.  Since admission patient has been having worsening weakness of both legs and severe pain.  He states the weakness and pain first started in the right leg on Tuesday and has progressed to include both legs. An MRI brain and MRI L-spine was performed-MRI brain showed a small subacute infarct in the left posterior temporal lobe.  MRI L-spine showed L4-L5 disc bulge.  There also some muscle edema in the L4-L5.   Date last known well: unclear, likely incidental infarct tPA Given: no outside tPA window  Baseline MRS 0    Past Medical History:  Diagnosis Date  . CKD (chronic kidney disease), stage III ([place])   . Congestive heart failure [facility]) May of 2011   Felt to have cor pulmonale; EF 45 to 50% from echo in May 2011  . Cor pulmonale (chronic) ([place])   . Gout    "take daily RX" (12/28/2017)  . Hyperlipidemia   . Hypertension   . Morbid obesity ([place])   . Persistent atrial fibrillation ([place])    [name] 12/28/2017  . Sleep apnea    "dx'd; couldn't tolerate CPAP" (12/28/2017)  . Thoracic aneurysm    a. 4.8cm thoracic aortic aneurysm by CT 2013.  [name] Thoracic aortic aneurysm ([place])    known/notes 12/28/2017  . Type II diabetes mellitus ([place])     Past Surgical History:  Procedure Laterality Date  . LAPAROSCOPIC CHOLECYSTECTOMY  2000  . US ECHOCARDIOGRAPHY  09/21/2009   EF 45-50%; Cavity size was severely dilated,  severe concentric hypertrophy and normal wall motion    Family History  Problem Relation Age of Onset  . Hypertension Mother   . Pancreatic cancer Father   . Prostate cancer Father   . Colon cancer Father    Social History:  reports that he has never smoked. He has never used smokeless tobacco. He reports that he drank alcohol. He reports that he does not use drugs.  Allergies: No Known Allergies  Medications:  I reviewed home medications   ROS:                                                                                                                                     14 systems reviewed and negative except above    Examination:                                                                                                      General: Appears well-developed and well-nourished.  Psych: Affect appropriate to situation Eyes: No scleral injection HENT: No OP obstrucion Head: Normocephalic.  Cardiovascular: Normal rate and regular rhythm.  Respiratory: Effort normal and breath sounds normal to anterior ascultation GI: Soft.  No distension. There is no tenderness.  Skin: WDI    Neurological Examination Mental Status: Alert, oriented, thought content appropriate.  Speech fluent without evidence of aphasia.  Able to follow 3 step commands without difficulty. Cranial Nerves: II: visual fields grossly normal, pupils equal, round, reactive to light and accommodation III,IV, VI: ptosis not present, extraocular muscles extra-ocular motions intact bilaterally V,VII: smile symmetric, facial light touch sensation normal bilaterally VIII: hearing normal bilaterally IX,X: gag reflex present XI: trapezius strength/neck flexion strength normal bilaterally XII: tongue strength normal  Motor: Right : Upper extremity    Left:     Upper extremity 5/5 deltoid       5/5  deltoid 5/5 tricep      5/5 tricep 5/5 biceps      5/5 biceps  5/5wrist flexion     5/5 wrist flexion 5/5 wrist extension     5/5 wrist extension 5/5 hand grip      5/5 hand grip  Lower extremity     Lower extremity 4/5 hip flexor      4/5 hip flexor 4/5 hip adductors     4/5 hip adductors 4/5 hip abductors     4/5 hip abductors 4/5 quadricep      4/5 quadriceps  4/5 hamstrings     4/5 hamstrings 4/5 plantar flexion       4/5 plantar flexion 4/5 plantar extension     4/5 plantar extension Tone and bulk:normal tone throughout; no atrophy noted. No clonus  Sensory: Pinprick and light touch intact throughout, bilaterally Deep Tendon Reflexes:  Right: Upper Extremity   Left: Upper extremity   biceps (C-5 to C-6) 2/4   biceps (C-5 to C-6) 2/4 tricep (C7) 2/4    triceps (C7) 2/4 Brachioradialis (C6) 2/4  Brachioradialis (C6)  2/4  Lower Extremity Lower Extremity  quadriceps (L-2 to L-4) 3/4   quadriceps (L-2 to L-4) 3/4 Achilles (S1) 3/4   Achilles (S1) 3/4 Plantars: Right: downgoing   Left: downgoing Cerebellar: normal finger-to-nose, normal rapid alternating movements and normal heel-to-shin test normal gait and station     Lab Results: Basic Metabolic Panel: Recent Labs  Lab 12/27/17 2354 12/29/17 0511 12/30/17 0550 12/31/17 0443 01/01/18 0159  NA 137 136 137 138 138  K 3.3* 3.1* 3.4* 3.6 3.7  CL 91* 90* 90* 92* 93*  CO2 30 31 34* 33* 34*  GLUCOSE 174* 162* 151* 139* 121*  BUN 56* 61* 60* 57* 54*  CREATININE 2.72* 2.60* 2.31* 2.18* 1.98*  CALCIUM 9.2 8.7* 9.0 9.1 8.9    CBC: Recent Labs  Lab 12/27/17 2354 12/29/17 0511 12/31/17 0443 01/01/18 0159  WBC 9.8 10.1 9.1 8.1  NEUTROABS  --   --  6.6 5.7  HGB 13.9 12.9* 13.0 12.4*  HCT 41.9 38.4* 39.9 38.4*  MCV 94.4 94.1 96.1 96.2  PLT 242 216 227 234    Coagulation Studies: No results for input(s): LABPROT, INR in the last 72 hours.  Imaging: Mr Brain Wo Contrast  Result Date: 12/31/2017 CLINICAL DATA:   Generalized weakness. EXAM: MRI HEAD WITHOUT CONTRAST TECHNIQUE: Multiplanar, multiecho pulse sequences of the brain and surrounding structures were obtained without intravenous contrast. COMPARISON:  Head CT 02/08/2015 FINDINGS: Brain: There is a small acute white matter infarct in the posterosuperior left temporal lobe adjacent to the lateral ventricle measuring 1.2 cm in maximal dimension. Multiple chronic microhemorrhages are present in both cerebral hemispheres as well as in the right thalamus and right lentiform nucleus. Small foci of T2 hyperintensity in the cerebral white matter bilaterally are nonspecific but compatible with mild chronic small vessel ischemic disease. The ventricles and sulci are normal for age. No mass, midline shift, or extra-axial fluid collection is seen. Vascular: Major intracranial vascular flow voids are preserved. Skull and upper cervical spine: Unremarkable bone marrow signal. Sinuses/Orbits: Unremarkable orbits. Right larger than left maxillary sinus mucous retention cysts. Clear mastoid air cells. Other: None. IMPRESSION: 1. Small acute posterior left temporal lobe infarct. 2. Mild chronic small vessel ischemic disease and scattered chronic microhemorrhages. Electronically Signed   By: Logan Bores M.D.   On: 12/31/2017 11:30   Mr Lumbar Spine Wo Contrast  Result Date: 12/31/2017 CLINICAL DATA:  Right flank pain for several days EXAM: MRI LUMBAR SPINE WITHOUT CONTRAST TECHNIQUE: Multiplanar, multisequence MR imaging of the lumbar spine was performed. No intravenous contrast was administered. COMPARISON:  None. FINDINGS: Segmentation:  Standard. Alignment:  Physiologic. Vertebrae:  No fracture, evidence of discitis, or bone lesion. Conus medullaris and cauda equina: Conus extends to the T12 level. Conus and cauda equina appear normal. Paraspinal and other soft tissues: No acute paraspinal abnormality. There is muscle edema in the multifidus muscle at the level of L4-5, left  greater than right likely reflecting muscle strain versus an inflammatory etiology. Disc levels: Disc spaces: Disc spaces are maintained. T12-L1: No significant disc bulge. No evidence of neural foraminal stenosis. No central canal stenosis. L1-L2: No significant disc bulge. No evidence of neural foraminal stenosis. No central canal stenosis. L2-L3: No significant disc bulge. No evidence of neural foraminal stenosis. No central canal stenosis. L3-L4: Minimal broad-based disc bulge. No evidence of neural foraminal stenosis. No central canal stenosis. L4-L5: Broad-based disc bulge. Moderate bilateral facet arthropathy with facet effusions. Moderate spinal stenosis and bilateral lateral recess stenosis. Moderate  left foraminal stenosis. Mild right foraminal stenosis. L5-S1: No significant disc bulge. No central canal stenosis. Moderate right facet arthropathy. 9 mm right facet synovial cyst projecting into the right neural foramen and resulting in moderate foraminal stenosis. IMPRESSION: 1. At L4-5 there is a broad-based disc bulge. Moderate bilateral facet arthropathy with facet effusions. Moderate spinal stenosis and bilateral lateral recess stenosis. Moderate left foraminal stenosis. Mild right foraminal stenosis. 2. At L5-S1 there is moderate right facet arthropathy. 9 mm right facet synovial cyst projecting into the right neural foramen and resulting in moderate foraminal stenosis. 3. There is muscle edema in the multifidus muscle at the level of L4-5, left greater than right likely reflecting muscle strain versus an inflammatory etiology. Electronically Signed   By: Kathreen Devoid   On: 12/31/2017 12:34     ASSESSMENT AND PLAN  52 y.o. male with Afib with RVR, CKD, systolic heart failure with EF of 20 to 25%, hypertension, diabetes, morbid obesity admitted to the hospital after presenting with right flank pain secondary due to renal calculi.  Has been complaining of lower back pain and noted to have worsening  bilateral lower extremity weakness.  On examination it improves with effort however still is weaker compared to his upper extremities.  No sensory loss.  Findings likely due to chronic pain from L4-L5 disc bulge.  Bilateral lower extremity weakness - weakness appears more to be limited 2/2 pain for L4-L5 disc bulge, however will recommend MRI T spine to rule out any thoracic cord compression -PT/OT evaluation  - consider gabapentin starting 100mg  TID -continue muscle relaxant  Acute/subacute posterior temporal lobe infarction - likely cardioembolic due to atrial fibrillation - continue Xarelto - obtain TTE, carotid doppler - PT/OT eval - BP goal of  Less than 140/90 ( normotension) - AIC, LDL - swallow evaluation    Sushanth Aroor [facility] Pager Number [phone]

## 2018-01-01 NOTE — Progress Notes (Signed)
  Echocardiogram 2D Echocardiogram has been performed.  Jay Chen 01/01/2018, 8:49 AM

## 2018-01-01 NOTE — Progress Notes (Signed)
STROKE TEAM PROGRESS NOTE   SUBJECTIVE (INTERVAL HISTORY) No family is at the bedside.  Patient lying in bed, no acute distress.  Still has bilateral lower extremity weakness, barely lifting up from bed.  However sensory intact.  MRI C-spine showed severe cervical spinal cord compression with cord edema, however, his symptoms is not typical for cervical myelopathy.  Complaint of lower back pain which limiting his leg movement.  Symptoms so far still more consistent with radiculopathy.  Neurosurgery consulted.   OBJECTIVE Temp:  [98.7 F (37.1 C)-99.5 F (37.5 C)] 99 F (37.2 C) (08/11 0510) Pulse Rate:  [51-106] 72 (08/11 0510) Cardiac Rhythm: Atrial fibrillation (08/10 2100) Resp:  [5-21] 12 (08/11 0510) BP: (85-136)/(55-104) 124/84 (08/11 0510) SpO2:  [91 %-97 %] 96 % (08/11 0510)  CBC:  Recent Labs  Lab 12/31/17 0443 01/01/18 0159  WBC 9.1 8.1  NEUTROABS 6.6 5.7  HGB 13.0 12.4*  HCT 39.9 38.4*  MCV 96.1 96.2  PLT 227 098    Basic Metabolic Panel:  Recent Labs  Lab 12/31/17 0443 01/01/18 0159  NA 138 138  K 3.6 3.7  CL 92* 93*  CO2 33* 34*  GLUCOSE 139* 121*  BUN 57* 54*  CREATININE 2.18* 1.98*  CALCIUM 9.1 8.9    Lipid Panel:     Component Value Date/Time   CHOL 116 01/01/2018 0159   TRIG 85 01/01/2018 0159   HDL 24 (L) 01/01/2018 0159   CHOLHDL 4.8 01/01/2018 0159   VLDL 17 01/01/2018 0159   LDLCALC 75 01/01/2018 0159   HgbA1c:  Lab Results  Component Value Date   HGBA1C 8.4 (H) 09/01/2017   Urine Drug Screen: No results found for: LABOPIA, COCAINSCRNUR, LABBENZ, AMPHETMU, THCU, LABBARB  Alcohol Level No results found for: [place] I have personally reviewed the radiological images below and agree with the radiology interpretations.  Mr Jodene Nam Head Wo Contrast 01/01/2018 IMPRESSION:  Negative intracranial MRA.   Mr Brain Wo Contrast 12/31/2017 IMPRESSION:  1. Small acute posterior left temporal lobe infarct.  2. Mild chronic small vessel  ischemic disease and scattered chronic microhemorrhages.   Mr Cervical Spine Wo Contrast 01/01/2018 IMPRESSION:  1. C4-5 severe cord compression due to disc protrusion, with cord edema.  2. C5-6 degenerative cord compression.  3. C6-7 mild degenerative spinal stenosis.  4. Slight prevertebral edema, question recent trauma. No evidence of discitis.  5. Bilateral foraminal impingement at C4-5 and C5-6.   Mr Thoracic Spine Wo Contrast 01/01/2018 IMPRESSION:  No acute finding.  No impingement or visible myelopathy.   Mr Lumbar Spine Wo Contrast 12/31/2017 IMPRESSION:  1. At L4-5 there is a broad-based disc bulge. Moderate bilateral facet arthropathy with facet effusions. Moderate spinal stenosis and bilateral lateral recess stenosis. Moderate left foraminal stenosis. Mild right foraminal stenosis.  2. At L5-S1 there is moderate right facet arthropathy. 9 mm right facet synovial cyst projecting into the right neural foramen and resulting in moderate foraminal stenosis.  3. There is muscle edema in the multifidus muscle at the level of L4-5, left greater than right likely reflecting muscle strain versus an inflammatory etiology.    Transthoracic Echocardiogram - Left ventricle: The cavity size was moderately to severely   dilated. Wall thickness was increased in a pattern of moderate   LVH. Systolic function was severely reduced. The estimated   ejection fraction was in the range of 20% to 25%. Diffuse   hypokinesis. The study is not technically sufficient to allow   evaluation of LV  diastolic function. - Aortic valve: Mildly calcified annulus. Valve area (VTI): 4.07   cm^2. Valve area (Vmax): 4.14 cm^2. Valve area (Vmean): 4.16   cm^2. - Aorta: There is a single still frame view of a portion of the   proximal ascending that is dilated measuring 4.7 cm, consistent   with moderate to large aneurysm. From chart review a similar   aneurysm was detected by CT scan 03/28/2012. Limited  evaluation by   echo, consider alternative modality if more complete evaluation   is indicated. - Left atrium: The atrium was severely dilated. - Right ventricle: The cavity size was mildly dilated. Systolic   function was mildly reduced. - Right atrium: The atrium was mildly to moderately dilated. - Atrial septum: No defect or patent foramen ovale was identified. - Inferior vena cava: The vessel was dilated. The respirophasic   diameter changes were blunted (< 50%), consistent with elevated   central venous pressure. - Technically difficult study. Echocontrast was used to enhance   visualization.   Bilateral Carotid Dopplers - pending    PHYSICAL EXAM  Temp:  [98.7 F (37.1 C)-99.5 F (37.5 C)] 99 F (37.2 C) (08/11 0510) Pulse Rate:  [51-106] 72 (08/11 0510) Resp:  [5-21] 12 (08/11 0510) BP: (85-136)/(55-104) 124/84 (08/11 0510) SpO2:  [91 %-96 %] 96 % (08/11 0510)  General - Well nourished, well developed, in no apparent distress.  Ophthalmologic - fundi not visualized due to noncooperation.  Cardiovascular - irregularly irregular heart rate and rhythm.  Mental Status -  Level of arousal and orientation to time, place, and person were intact. Language including expression, naming, repetition, comprehension was assessed and found intact. Fund of Knowledge was assessed and was intact.  Cranial Nerves II - XII - II - Visual field intact OU. III, IV, VI - Extraocular movements intact. V - Facial sensation intact bilaterally. VII - Facial movement intact bilaterally. VIII - Hearing & vestibular intact bilaterally. X - Palate elevates symmetrically. XI - Chin turning & shoulder shrug intact bilaterally. XII - Tongue protrusion intact.  Motor Strength - The patient's strength was normal in all upper extremities and pronator drift was absent.  However, BLEs 3-/5 proximally and 3/5 distally with painful back on leg movements. Bulk was normal and fasciculations were  absent.   Motor Tone - Muscle tone was assessed at the neck and appendages and was normal.  Reflexes - The patient's reflexes were 2+ UEs and 2+ petaller 2+ left ankle but 1+ right ankle reflexes and he had left babinski.  Sensory - Light touch, temperature/pinprick, and propriocetion were assessed and were intact.  Vibration sensation decreased on the right ankle and bilateral toes  Coordination - The patient had normal movements in the hands with no ataxia or dysmetria.  Tremor was absent.  Gait and Station - not able to test due to weakness.    ASSESSMENT/PLAN [name] is a 52 y.o. male with history of Afib with RVR (on Xarelto), CKD,systolic heart failure with EF of 20 to 25%, hypertension, diabetes, hyperlipidemia, thoracic aortic aneurysm, OSA, and morbid obesity presenting with bilateral lower extremity pain and weakness. He did not receive IV t-PA due to late presentation.   Cervical spinal cord compression with lumbar radiculopathy  Pt examination more consistent with lumbar radiculopathy than cervical myelopathy  MRI C-spine cervical C4-5 disc protrusion, with cord compression and cord edema  MRI L-spine L4-5 disc bulging with spinal stenosis  MRI T-spine negative  NSG consulted agree with symptoms  of atypical for cervical myelopathy but more consistent with lumbar radiculopathy  NSG recommend conservative treatment of lumbar radiculopathy and outpatient follow-up for cervical cord compression  Was given 10mg  decadron IV once. As per NSG, continue with prednisone Dosepak  Pain control for LBP  PT/OT  Stroke, incidental finding: small acute posterior Lt temporal lobe infarct - embolic -likely due to atrial fibrillation   CT head - not performed  MRI head - Small acute posterior left temporal lobe infarct.   MRA head - Negative intracranial MRA.  Carotid Doppler - pending  2D Echo - EF 20 to 25%  LDL - 75  HgbA1c - pending  VTE prophylaxis -  Xarelto  Diet - heart healthy/carb modified Room service appropriate? Yes; Fluid consistency: Thin    Xarelto (rivaroxaban) daily prior to admission, now on Xarelto (rivaroxaban) daily.   Patient counseled to be compliant with his antithrombotic medications  Ongoing aggressive stroke risk factor management  Therapy recommendations:  pending  Disposition:  Pending  A. fib with RVR  On amiodarone IV switch to p.o.  Cardiology on board  With currently controlled  On Xarelto  Cardiomyopathy  EF 20 to 35%  Cardiology on board  On Coreg, Lasix  Hypertension  BP low at times . Long-term BP goal normotensive  Hyperlipidemia  Lipid lowering medication PTA: Lipitor 40 mg daily  LDL 75, goal < 70  increasing to Lipitor 80 mg  Continue statin at discharge  Diabetes  HgbA1c pending, goal < 7.0  Glucose controlled  SSI  CBG monitoring  Other Stroke Risk Factors  ETOH use, advised to drink no more than 1 alcoholic beverage per day.  Obesity, Body mass index is 52.83 kg/m., recommend weight loss, diet and exercise as appropriate   Obstructive sleep apnea, not on CPAP at home - didn't tolerate.  Other Active Problems  CKD creatinine 2.72-> 2.31-> 1.98  Hospital day # 4  I spent  35 minutes in total face-to-face time with the patient, more than 50% of which was spent in counseling and coordination of care, reviewing test results, images and medication, and discussing the diagnosis of cervical spinal cord compression, cord edema, lumbar radiculopathy, A. fib RVR, stroke, cardiomyopathy, treatment plan and potential prognosis. This patient's care requiresreview of multiple databases, neurological assessment, discussion with family, other specialists and medical decision making of high complexity. I have discussed with NSG Dr. Kathyrn Sheriff.  Rosalin Hawking, MD PhD Stroke Neurology 01/01/2018 2:14 PM   To contact Stroke Continuity provider, please refer to  [email or web address] After hours, contact General Neurology

## 2018-01-01 NOTE — Progress Notes (Signed)
PROGRESS NOTE    Jay Chen  QAS:[id number] DOB: 11-09-1965 DOA: 12/27/2017 PCP: [name], MD  Brief Narrative:52 year old male with past medical history of systolic CHF with ejection fraction of 20 to 25%, diabetes, hypertension, hyperlipidemia, morbid obesity, obstructive sleep apnea, pulmonary hypertension, right-sided heart failure who presented with right-sided flank pain.  Patient also reported of some shortness of breath but no chest pain and  bilateral lower extremity edema.  CT imaging done in the emergency department did show nonobstructive renal calculi.  Patient was also found to have acute kidney injury on his CKD.  Chest x-ray showed cardiomegaly and mild vascular congestion.  Cardiology has been consulted and following.  Assessment & Plan:   Principal Problem:   Atrial fibrillation with rapid ventricular response (HCC) Active Problems:   Obstructive sleep apnea   AKI (acute kidney injury) (HCC)   Flank pain   Chronic combined systolic and diastolic heart failure (HCC)   DCM (dilated cardiomyopathy) (HCC)   PNA (pneumonia)   Cerebral embolism with cerebral infarction   Acute decompensated heart failure (HCC)   Lumbar radiculopathy   Cervical spinal cord compression (HCC)  Cervical spine cord compression and lumbar radiculopathy- bilateral lower extremity weakness, denies not have any urinary or bowel incontinence.  - MRI C-spine cervical C4-5 disc protrusion, with cord compression and cord edema - MRI L-spine L4-5 disc bulging with spinal stenosis - MRI T-spine negative - MRI Brain small acute posterior temporal lobe infarct,probable embolic.  -Neuro surgery, Dr. Salley Scarlet recs, conservative therapy for lumbar spondylosis.  Patient is symptomatic from cervical stenosis-we will address this as outpatient, will need surgical decompression for a C5 corpectomy. -Neurology following appreciate recommendations -Decadron 10 mg x 1 given, Monitor  blood sugars with DM. - PT recommends CIR.  - check hba1c. - check echo 01/01/18-EF 20 to 25%, moderately to severely dilated, diffuse hypokinesis. - .lipid panel-LDL 75, HDL 24.  CVA-small acute posterior left temporal lobe infarct, likely embolic from A. Fib. -Neurology following Dr Erlinda Hong,  appreciate recommendations, continue Xarelto, pending final recommendations  Hypoxic respiratory failure 2/2 acute on chronic systolic heart failure, LLL pneumonia- with ejection fraction 20 to 35%.  Initial chest x-ray with pulmonary edema, elevated BNP. On xarelto- doubt PE. likely element of OHS and OSA.  - Chest x-ray with pulmonary edema, Lasix 40 mg daily. -Continue IV Rocephin and doxycycline -If no significant improvement. consider chest CT -Strict input output Daily weights BMP a.m.  AKI on CKD stage III.  Cr- 2.7 >> 1.9.  Improving with resumption of Lasix.  Delene Loll held .    A. fib RVR -rate controlled.  On anticoagulation. -Cardiology following, recs apprecaited-consider DCCV prior to discharge, but will wait to see if anything needs to be done regarding findings on MRI of spine, as he would not be able to come off anticoagulation for 4 weeks after DCCV. - on Coreg and PO amiodarone per cardiology.  - Cont Xarelto.  OSA- not plans with CPAP at home. - CPAP QHS  DM2 . Hgba1c-- 8.4 - Metformin and Farxiga at home held for AKI - SS1  morbid obesity obstructive sleep apnea and pulmonary hypertension patient noncompliant with CPAP encouraged to use while in the hospital.  Thoracic aortic aneurysm need outpatient follow-up.  Hypokalemia resolved.ON DAILY KDUR with lasix.  hyperlipidemia continue Lipitor.  DVT prophylaxis: Xarelto Code Status: Full code Family Communication: No family available patient lives at home with family. Disposition Plan: TBD Consultants:  Cardiology Procedures: None Antimicrobials:  Rocephin and doxycycline 8/10 >>  Subjective: Lying in bed.  No  complaints.  No chest pain.  Currently on 4 L O2.  Objective: Vitals:   12/31/17 2348 12/31/17 2350 01/01/18 0510 01/01/18 2003  BP: 98/76 115/82 124/84   Pulse:  (!) 51 72   Resp:  (!) 21 12   Temp: 99.5 F (37.5 C)  99 F (37.2 C) 98.2 F (36.8 C)  TempSrc: Oral  Oral Oral  SpO2:  91% 96%   Weight:      Height:        Intake/Output Summary (Last 24 hours) at 01/01/2018 2007 Last data filed at 01/01/2018 1615 Gross per 24 hour  Intake 100 ml  Output 1100 ml  Net -1000 ml   Filed Weights   12/29/17 0500 12/30/17 0631 12/31/17 0746  Weight: (!) 159.5 kg (!) 157 kg (!) 157.6 kg    Examination: Morbidly obese gentleman in no acute distress resting in bed. General exam: Appears calm and comfortable  Respiratory system: Clear to auscultation. Respiratory effort normal. Cardiovascular system: S1 & S2 heard, RRR. No JVD, murmurs, rubs, gallops or clicks. Gastrointestinal system: Abdomen obese, is nondistended, soft and nontender. No organomegaly or masses felt. Normal bowel sounds heard. Central nervous system: Alert and oriented. No focal neurological deficits. Extremities trace pitting pedal edema bilaterally. Skin: No rashes, lesions or ulcers Psychiatry: Judgement and insight appear normal. Mood & affect appropriate.   Data Reviewed: I have personally reviewed following labs and imaging studies  CBC: Recent Labs  Lab 12/27/17 2354 12/29/17 0511 12/31/17 0443 01/01/18 0159  WBC 9.8 10.1 9.1 8.1  NEUTROABS  --   --  6.6 5.7  HGB 13.9 12.9* 13.0 12.4*  HCT 41.9 38.4* 39.9 38.4*  MCV 94.4 94.1 96.1 96.2  PLT 242 216 227 468   Basic Metabolic Panel: Recent Labs  Lab 12/27/17 2354 12/29/17 0511 12/30/17 0550 12/31/17 0443 01/01/18 0159  NA 137 136 137 138 138  K 3.3* 3.1* 3.4* 3.6 3.7  CL 91* 90* 90* 92* 93*  CO2 30 31 34* 33* 34*  GLUCOSE 174* 162* 151* 139* 121*  BUN 56* 61* 60* 57* 54*  CREATININE 2.72* 2.60* 2.31* 2.18* 1.98*  CALCIUM 9.2 8.7* 9.0 9.1  8.9   CBG: Recent Labs  Lab 12/31/17 1637 12/31/17 2133 01/01/18 0921 01/01/18 1243 01/01/18 1708  GLUCAP 128* 140* 112* 149* 229*   Lipid Profile: Recent Labs    01/01/18 0159  CHOL 116  HDL 24*  LDLCALC 75  TRIG 85  CHOLHDL 4.8    Recent Results (from the past 240 hour(s))  MRSA PCR Screening     Status: None   Collection Time: 12/31/17  5:05 AM  Result Value Ref Range Status   MRSA by PCR NEGATIVE NEGATIVE Final    Comment:        The GeneXpert MRSA Assay (FDA approved for NASAL specimens only), is one component of a comprehensive MRSA colonization surveillance program. It is not intended to diagnose MRSA infection nor to guide or monitor treatment for MRSA infections. Performed at [facility], [place] [address], [place], [place] [address]      Radiology Studies: Mr Virgel Paling YQ Contrast  Result Date: 01/01/2018 CLINICAL DATA:  Stroke follow-up EXAM: MRA HEAD WITHOUT CONTRAST TECHNIQUE: Angiographic images of the Circle of Willis were obtained using MRA technique without intravenous contrast. COMPARISON:  Brain MRI yesterday FINDINGS: Symmetric carotid and vertebral arteries. Vessels are smooth and widely patent.  Negative for aneurysm. IMPRESSION: Negative intracranial MRA. Electronically Signed   By: Monte Fantasia M.D.   On: 01/01/2018 08:32   Mr Brain Wo Contrast  Result Date: 12/31/2017 CLINICAL DATA:  Generalized weakness. EXAM: MRI HEAD WITHOUT CONTRAST TECHNIQUE: Multiplanar, multiecho pulse sequences of the brain and surrounding structures were obtained without intravenous contrast. COMPARISON:  Head CT 02/08/2015 FINDINGS: Brain: There is a small acute white matter infarct in the posterosuperior left temporal lobe adjacent to the lateral ventricle measuring 1.2 cm in maximal dimension. Multiple chronic microhemorrhages are present in both cerebral hemispheres as well as in the right thalamus and right lentiform nucleus. Small foci of T2 hyperintensity  in the cerebral white matter bilaterally are nonspecific but compatible with mild chronic small vessel ischemic disease. The ventricles and sulci are normal for age. No mass, midline shift, or extra-axial fluid collection is seen. Vascular: Major intracranial vascular flow voids are preserved. Skull and upper cervical spine: Unremarkable bone marrow signal. Sinuses/Orbits: Unremarkable orbits. Right larger than left maxillary sinus mucous retention cysts. Clear mastoid air cells. Other: None. IMPRESSION: 1. Small acute posterior left temporal lobe infarct. 2. Mild chronic small vessel ischemic disease and scattered chronic microhemorrhages. Electronically Signed   By: Logan Bores M.D.   On: 12/31/2017 11:30   Mr Cervical Spine Wo Contrast  Result Date: 01/01/2018 CLINICAL DATA:  Painful myelopathy EXAM: MRI CERVICAL SPINE WITHOUT CONTRAST TECHNIQUE: Multiplanar, multisequence MR imaging of the cervical spine was performed. No intravenous contrast was administered. COMPARISON:  Cervical spine CT 09/27/2009 FINDINGS: Alignment: Reversal of cervical lordosis with 1-2 mm of C3-4 anterolisthesis. Vertebrae: No fracture, evidence of discitis, or bone lesion. Cord: T2 hyperintensity about the compressed cord at C4-5. Posterior Fossa, vertebral arteries, paraspinal tissues: Trace prevertebral edema at C3 and C4, possibly reflecting recent injury. There is no indication of underlying bony infection or discitis. Disc levels: C2-3: Facet spurring.  Negative disc.  Foraminal narrowing is mild. C3-4: Facet spurring asymmetric to the left with mild anterolisthesis. Mild uncovertebral spurring. Foramina are patent. C4-5: Disc narrowing with central protrusion compressing the edematous cord. Cord compression is severe, with a 2 mm canal diameter in the midline. Disc height loss and uncovertebral spurring causes right more than left foraminal impingement C5-6: Disc narrowing and endplate degeneration with bulge compressing the  cord. Bilateral foraminal impingement from disc height loss and uncovertebral spurring. C6-7: Disc narrowing and endplate degeneration with posterior disc osteophyte complex contacting the ventral cord. Mild bilateral foraminal narrowing C7-T1:Minor facet spurring.  Mild left foraminal stenosis. Case discussed with Dr. Erlinda Hong via telephone at time of signing IMPRESSION: 1. C4-5 severe cord compression due to disc protrusion, with cord edema. 2. C5-6 degenerative cord compression. 3. C6-7 mild degenerative spinal stenosis. 4. Slight prevertebral edema, question recent trauma. No evidence of discitis. 5. Bilateral foraminal impingement at C4-5 and C5-6. Electronically Signed   By: Monte Fantasia M.D.   On: 01/01/2018 08:07   Mr Thoracic Spine Wo Contrast  Result Date: 01/01/2018 CLINICAL DATA:  Acute or progressive myelopathy. Worsening weakness in both legs in severe pain. EXAM: MRI THORACIC SPINE WITHOUT CONTRAST TECHNIQUE: Multiplanar, multisequence MR imaging of the thoracic spine was performed. No intravenous contrast was administered. COMPARISON:  None. FINDINGS: Alignment:  Normal thoracic alignment. Counting sequence of the cervical spine shows C3-4 facet mediated anterolisthesis and advanced disc degeneration at C4-5, C5-6, and C6-7. per the time line a cervical MRI has been ordered. Vertebrae: No fracture, evidence of discitis, or bone lesion. Cord:  Normal signal and morphology Paraspinal and other soft tissues: Intrinsic back muscle edema minimally seen in the lower thoracic spine, continuation of lumbar spine pathology as described previously. Disc levels: Generalized spondylosis. Disc height and hydration is well preserved. No notable facet arthropathy. Negative for cord impingement. There is symmetric diffuse foraminal patency. IMPRESSION: No acute finding.  No impingement or visible myelopathy. Electronically Signed   By: Monte Fantasia M.D.   On: 01/01/2018 07:49   Mr Lumbar Spine Wo  Contrast  Result Date: 12/31/2017 CLINICAL DATA:  Right flank pain for several days EXAM: MRI LUMBAR SPINE WITHOUT CONTRAST TECHNIQUE: Multiplanar, multisequence MR imaging of the lumbar spine was performed. No intravenous contrast was administered. COMPARISON:  None. FINDINGS: Segmentation:  Standard. Alignment:  Physiologic. Vertebrae:  No fracture, evidence of discitis, or bone lesion. Conus medullaris and cauda equina: Conus extends to the T12 level. Conus and cauda equina appear normal. Paraspinal and other soft tissues: No acute paraspinal abnormality. There is muscle edema in the multifidus muscle at the level of L4-5, left greater than right likely reflecting muscle strain versus an inflammatory etiology. Disc levels: Disc spaces: Disc spaces are maintained. T12-L1: No significant disc bulge. No evidence of neural foraminal stenosis. No central canal stenosis. L1-L2: No significant disc bulge. No evidence of neural foraminal stenosis. No central canal stenosis. L2-L3: No significant disc bulge. No evidence of neural foraminal stenosis. No central canal stenosis. L3-L4: Minimal broad-based disc bulge. No evidence of neural foraminal stenosis. No central canal stenosis. L4-L5: Broad-based disc bulge. Moderate bilateral facet arthropathy with facet effusions. Moderate spinal stenosis and bilateral lateral recess stenosis. Moderate left foraminal stenosis. Mild right foraminal stenosis. L5-S1: No significant disc bulge. No central canal stenosis. Moderate right facet arthropathy. 9 mm right facet synovial cyst projecting into the right neural foramen and resulting in moderate foraminal stenosis. IMPRESSION: 1. At L4-5 there is a broad-based disc bulge. Moderate bilateral facet arthropathy with facet effusions. Moderate spinal stenosis and bilateral lateral recess stenosis. Moderate left foraminal stenosis. Mild right foraminal stenosis. 2. At L5-S1 there is moderate right facet arthropathy. 9 mm right facet  synovial cyst projecting into the right neural foramen and resulting in moderate foraminal stenosis. 3. There is muscle edema in the multifidus muscle at the level of L4-5, left greater than right likely reflecting muscle strain versus an inflammatory etiology. Electronically Signed   By: Kathreen Devoid   On: 12/31/2017 12:34   Scheduled Meds: . amiodarone  200 mg Oral BID  . [START ON 01/02/2018] atorvastatin  80 mg Oral Daily  . carvedilol  6.25 mg Oral BID  . doxycycline  100 mg Oral Q12H  . feeding supplement (ENSURE ENLIVE)  237 mL Oral BID BM  . furosemide  40 mg Oral Daily  . insulin aspart  0-5 Units Subcutaneous QHS  . insulin aspart  0-9 Units Subcutaneous TID WC  . mouth rinse  15 mL Mouth Rinse BID  . methocarbamol  500 mg Oral TID  . omega-3 acid ethyl esters  2 g Oral BID  . potassium chloride SA  40 mEq Oral Daily  . rivaroxaban  20 mg Oral Q supper  . senna-docusate  2 tablet Oral QHS  . sodium chloride flush  3 mL Intravenous Q12H   Continuous Infusions: . sodium chloride 250 mL (12/30/17 1724)  . cefTRIAXone (ROCEPHIN)  IV 2 g (01/01/18 1615)     LOS: 4 days   Anieya Helman Arlyce Dice, MD [facility]  If 7PM-7AM, please  contact night-coverage [email or web address] Password [facility] 01/01/2018, 8:07 PM

## 2018-01-02 ENCOUNTER — Encounter (HOSPITAL_COMMUNITY): Payer: BC Managed Care – PPO | Admitting: Internal Medicine

## 2018-01-02 DIAGNOSIS — G9520 Unspecified cord compression: Secondary | ICD-10-CM

## 2018-01-02 DIAGNOSIS — I63412 Cerebral infarction due to embolism of left middle cerebral artery: Secondary | ICD-10-CM

## 2018-01-02 DIAGNOSIS — I509 Heart failure, unspecified: Secondary | ICD-10-CM

## 2018-01-02 DIAGNOSIS — G4733 Obstructive sleep apnea (adult) (pediatric): Secondary | ICD-10-CM

## 2018-01-02 DIAGNOSIS — M5416 Radiculopathy, lumbar region: Secondary | ICD-10-CM

## 2018-01-02 LAB — HEMOGLOBIN A1C
HEMOGLOBIN A1C: 7.8 % — AB (ref 4.8–5.6)
Mean Plasma Glucose: 177 mg/dL

## 2018-01-02 LAB — BASIC METABOLIC PANEL
ANION GAP: 14 (ref 5–15)
BUN: 60 mg/dL — ABNORMAL HIGH (ref 6–20)
CALCIUM: 9.3 mg/dL (ref 8.9–10.3)
CHLORIDE: 94 mmol/L — AB (ref 98–111)
CO2: 32 mmol/L (ref 22–32)
CREATININE: 1.73 mg/dL — AB (ref 0.61–1.24)
GFR calc Af Amer: 50 mL/min — ABNORMAL LOW (ref >60)
GFR calc non Af Amer: 43 mL/min — ABNORMAL LOW (ref >60)
Glucose, Bld: 190 mg/dL — ABNORMAL HIGH (ref 70–99)
Potassium: 4 mmol/L (ref 3.5–5.1)
Sodium: 140 mmol/L (ref 135–145)

## 2018-01-02 LAB — GLUCOSE, CAPILLARY
GLUCOSE-CAPILLARY: 152 mg/dL — AB (ref 70–99)
GLUCOSE-CAPILLARY: 173 mg/dL — AB (ref 70–99)
GLUCOSE-CAPILLARY: 135 mg/dL — AB (ref 70–99)
Glucose-Capillary: 151 mg/dL — ABNORMAL HIGH (ref 70–99)

## 2018-01-02 MED ORDER — METOPROLOL TARTRATE 25 MG PO TABS
25.0000 mg | ORAL_TABLET | Freq: Once | ORAL | Status: AC
  Administered 2018-01-02: 25 mg via ORAL
  Filled 2018-01-02: qty 1

## 2018-01-02 MED ORDER — FUROSEMIDE 40 MG PO TABS
40.0000 mg | ORAL_TABLET | Freq: Two times a day (BID) | ORAL | Status: DC
  Administered 2018-01-02: 40 mg via ORAL
  Filled 2018-01-02: qty 1

## 2018-01-02 MED ORDER — METOPROLOL SUCCINATE ER 50 MG PO TB24
50.0000 mg | ORAL_TABLET | Freq: Two times a day (BID) | ORAL | Status: DC
  Administered 2018-01-02 – 2018-01-03 (×3): 50 mg via ORAL
  Filled 2018-01-02 (×3): qty 1

## 2018-01-02 MED FILL — Perflutren Lipid Microsphere IV Susp 1.1 MG/ML: INTRAVENOUS | Qty: 10 | Status: AC

## 2018-01-02 NOTE — Progress Notes (Addendum)
STROKE TEAM PROGRESS NOTE   SUBJECTIVE (INTERVAL HISTORY) Pt RN and wife are at bedside. Pt back pain much improved and his LE strength also improved. Now he is able to lift legs up without difficulty. Pending CIR.    OBJECTIVE Temp:  [98 F (36.7 C)-98.9 F (37.2 C)] 98.8 F (37.1 C) (08/12 1212) Pulse Rate:  [94-112] 94 (08/12 1212) Cardiac Rhythm: Atrial fibrillation;Bundle branch block (08/12 0700) Resp:  [13-22] 17 (08/12 1212) BP: (103-142)/(79-123) 117/83 (08/12 1212) SpO2:  [93 %-98 %] 95 % (08/12 1212) Weight:  [155.1 kg] 155.1 kg (08/12 0500)  CBC:  Recent Labs  Lab 12/31/17 0443 01/01/18 0159  WBC 9.1 8.1  NEUTROABS 6.6 5.7  HGB 13.0 12.4*  HCT 39.9 38.4*  MCV 96.1 96.2  PLT 227 063    Basic Metabolic Panel:  Recent Labs  Lab 01/01/18 0159 01/02/18 0436  NA 138 140  K 3.7 4.0  CL 93* 94*  CO2 34* 32  GLUCOSE 121* 190*  BUN 54* 60*  CREATININE 1.98* 1.73*  CALCIUM 8.9 9.3    Lipid Panel:     Component Value Date/Time   CHOL 116 01/01/2018 0159   TRIG 85 01/01/2018 0159   HDL 24 (L) 01/01/2018 0159   CHOLHDL 4.8 01/01/2018 0159   VLDL 17 01/01/2018 0159   LDLCALC 75 01/01/2018 0159   HgbA1c:  Lab Results  Component Value Date   HGBA1C 7.8 (H) 01/01/2018   Urine Drug Screen: No results found for: LABOPIA, COCAINSCRNUR, LABBENZ, AMPHETMU, THCU, LABBARB  Alcohol Level No results found for: [place] I have personally reviewed the radiological images below and agree with the radiology interpretations.  Mr Jodene Nam Head Wo Contrast 01/01/2018 IMPRESSION:  Negative intracranial MRA.   Mr Brain Wo Contrast 12/31/2017 IMPRESSION:  1. Small acute posterior left temporal lobe infarct.  2. Mild chronic small vessel ischemic disease and scattered chronic microhemorrhages.   Mr Cervical Spine Wo Contrast 01/01/2018 IMPRESSION:  1. C4-5 severe cord compression due to disc protrusion, with cord edema.  2. C5-6 degenerative cord compression.  3.  C6-7 mild degenerative spinal stenosis.  4. Slight prevertebral edema, question recent trauma. No evidence of discitis.  5. Bilateral foraminal impingement at C4-5 and C5-6.   Mr Thoracic Spine Wo Contrast 01/01/2018 IMPRESSION:  No acute finding.  No impingement or visible myelopathy.   Mr Lumbar Spine Wo Contrast 12/31/2017 IMPRESSION:  1. At L4-5 there is a broad-based disc bulge. Moderate bilateral facet arthropathy with facet effusions. Moderate spinal stenosis and bilateral lateral recess stenosis. Moderate left foraminal stenosis. Mild right foraminal stenosis.  2. At L5-S1 there is moderate right facet arthropathy. 9 mm right facet synovial cyst projecting into the right neural foramen and resulting in moderate foraminal stenosis.  3. There is muscle edema in the multifidus muscle at the level of L4-5, left greater than right likely reflecting muscle strain versus an inflammatory etiology.    Transthoracic Echocardiogram - Left ventricle: The cavity size was moderately to severely   dilated. Wall thickness was increased in a pattern of moderate   LVH. Systolic function was severely reduced. The estimated   ejection fraction was in the range of 20% to 25%. Diffuse   hypokinesis. The study is not technically sufficient to allow   evaluation of LV diastolic function. - Aortic valve: Mildly calcified annulus. Valve area (VTI): 4.07   cm^2. Valve area (Vmax): 4.14 cm^2. Valve area (Vmean): 4.16   cm^2. - Aorta: There is a single  still frame view of a portion of the   proximal ascending that is dilated measuring 4.7 cm, consistent   with moderate to large aneurysm. From chart review a similar   aneurysm was detected by CT scan 03/28/2012. Limited evaluation by   echo, consider alternative modality if more complete evaluation   is indicated. - Left atrium: The atrium was severely dilated. - Right ventricle: The cavity size was mildly dilated. Systolic   function was mildly  reduced. - Right atrium: The atrium was mildly to moderately dilated. - Atrial septum: No defect or patent foramen ovale was identified. - Inferior vena cava: The vessel was dilated. The respirophasic   diameter changes were blunted (< 50%), consistent with elevated   central venous pressure. - Technically difficult study. Echocontrast was used to enhance   visualization.   Bilateral Carotid Dopplers 01/01/2018 Preliminary report:  1-39% ICA plaquing.  Right vertebral artery flow is antegrade.  Unable to insonate left vertebral artery secondary to body habitus.    PHYSICAL EXAM  Temp:  [98 F (36.7 C)-98.9 F (37.2 C)] 98.8 F (37.1 C) (08/12 1212) Pulse Rate:  [94-112] 94 (08/12 1212) Resp:  [13-22] 17 (08/12 1212) BP: (103-142)/(79-123) 117/83 (08/12 1212) SpO2:  [93 %-98 %] 95 % (08/12 1212) Weight:  [155.1 kg] 155.1 kg (08/12 0500)  General - Well nourished, well developed, in no apparent distress.  Ophthalmologic - fundi not visualized due to noncooperation.  Cardiovascular - irregularly irregular heart rate and rhythm.  Mental Status -  Level of arousal and orientation to time, place, and person were intact. Language including expression, naming, repetition, comprehension was assessed and found intact. Fund of Knowledge was assessed and was intact.  Cranial Nerves II - XII - II - Visual field intact OU. III, IV, VI - Extraocular movements intact. V - Facial sensation intact bilaterally. VII - Facial movement intact bilaterally. VIII - Hearing & vestibular intact bilaterally. X - Palate elevates symmetrically. XI - Chin turning & shoulder shrug intact bilaterally. XII - Tongue protrusion intact.  Motor Strength - The patient's strength was normal in all upper extremities and pronator drift was absent.  However, BLEs 3+/5 proximally and distally with minimal LBP on leg movements. Bulk was normal and fasciculations were absent.   Motor Tone - Muscle tone was  assessed at the neck and appendages and was normal.  Reflexes - The patient's reflexes were 2+ UEs and 2+ petaller 2+ left ankle but 1+ right ankle reflexes and he had left babinski.  Sensory - Light touch, temperature/pinprick, and propriocetion were assessed and were intact.  Vibration sensation decreased on the right ankle and bilateral toes  Coordination - The patient had normal movements in the hands with no ataxia or dysmetria.  Tremor was absent.  Gait and Station - not able to test due to weakness.    ASSESSMENT/PLAN Jay Chen is a 52 y.o. male with history of Afib with RVR (on Xarelto), CKD,systolic heart failure with EF of 20 to 25%, hypertension, diabetes, hyperlipidemia, thoracic aortic aneurysm, OSA, and morbid obesity presenting with bilateral lower extremity pain and weakness. He did not receive IV t-PA due to late presentation.   Cervical spinal cord compression with lumbar radiculopathy  Pt examination more consistent with lumbar radiculopathy than cervical myelopathy  MRI C-spine cervical C4-5 disc protrusion, with cord compression and cord edema  MRI L-spine L4-5 disc bulging with spinal stenosis  MRI T-spine negative  NSG consulted agree with symptoms of atypical for  cervical myelopathy but more consistent with lumbar radiculopathy  NSG recommend conservative treatment of lumbar radiculopathy and outpatient follow-up for cervical cord compression  Was given 10mg  decadron IV once. Steroids was not continued. Currently clinical improving, will hold off steroids given DM.   Pain control for LBP  PT/OT  Stroke, incidental finding: small acute posterior Lt temporal lobe infarct - embolic -likely due to atrial fibrillation   CT head - not performed  MRI head - Small acute posterior left temporal lobe infarct.   MRA head - Negative intracranial MRA.  Carotid Doppler - normal except Lt VA not seen  2D Echo - EF 20 to 25%  LDL - 75  HgbA1c -  7.8  VTE prophylaxis - Xarelto  Diet - heart healthy/carb modified Room service appropriate? Yes; Fluid consistency: Thin    Xarelto (rivaroxaban) daily prior to admission, now on Xarelto (rivaroxaban) daily.   Patient counseled to be compliant with his antithrombotic medications  Ongoing aggressive stroke risk factor management  Therapy recommendations:  CIR  Disposition:  Pending  A. fib with RVR  On amiodarone IV switch to p.o.  Cardiology on board  With currently controlled  On Xarelto  Cardiomyopathy  EF 20 to 35%  Cardiology on board  On Coreg, Lasix  Hypertension  BP low at times . Long-term BP goal normotensive  Hyperlipidemia  Lipid lowering medication PTA: Lipitor 40 mg daily  LDL 75, goal < 70  increasing to Lipitor 80 mg  Continue statin at discharge  Diabetes  HgbA1c 7.8, goal < 7.0  Glucose controlled (uncontrolled PTA)  SSI  CBG monitoring  Steroids was not continued  Other Stroke Risk Factors  ETOH use, advised to drink no more than 1 alcoholic beverage per day.  Obesity, Body mass index is 51.99 kg/m., recommend weight loss, diet and exercise as appropriate   Obstructive sleep apnea, not on CPAP at home - didn't tolerate.  Other Active Problems  CKD creatinine 2.72-> 2.31-> 1.98->1.73  Hospital day # 5  Neurology will sign off. Please call with questions. Pt will follow up with stroke clinic NP at [facility] in about 4 weeks. Thanks for the consult.  Rosalin Hawking, MD PhD Stroke Neurology 01/02/2018 5:01 PM  To contact Stroke Continuity provider, please refer to [email or web address] After hours, contact General Neurology

## 2018-01-02 NOTE — Consult Note (Signed)
Physical Medicine and Rehabilitation Consult Reason for Consult: Decreased functional mobility Referring Physician: Internal medicine   HPI: Jay Chen is a 52 y.o. right-handed male with history of diastolic congestive heart failure, CKD stage III, history of thoracic aneurysm, diabetes mellitus, hypertension, hyperlipidemia, morbid obesity/OSA.  Presented 12/28/2017 right-sided flank pain and bilateral lower extremity weakness.  Per chart review patient lives with spouse and children ages 41 and 62.  Reported to be independent driving prior to admission.  2 level home with bedroom on [address] and 3 steps to entry.  Wife works during the day.  His mother can check on him.  Chest x-ray showed mild vascular congestion and borderline cardiomegaly.  CT renal stone study no acute abnormality seen.  There was a 2 mm nonobstructing stone at the lower pole of the right kidney.  Ultrasound aorta without abdominal aortic aneurysm noted.  MRI of the brain showed small acute posterior left temporal lobe infarction.  MRI lumbar thoracic cervical spine showed L4-5 broad-based disc bulge.  Moderate bilateral facet arthropathy with facet effusions.  L5-S1 with moderate right facet arthropathy.  MRI cervical spine with C4-5 severe cord compression due to disc protrusion with cord edema.  Echocardiogram with ejection fraction of 25%.  Systolic function severely reduced.  Close monitoring of renal function 2.31-2.72.  Cardiology services consulted in regards to CHF as well as persistent atrial fibrillation and remains on Coreg as well as Lasix.  Xarelto was added for atrial fibrillation with cardiac rate controlled.  Neurology consulted and follow-up currently maintained on a prednisone Dosepak for cervical spinal cord compression with lumbar radiculopathy.  Therapy evaluation completed with recommendations of physical medicine rehab consult.   Review of Systems  Constitutional: Negative for chills.       Denies  fever or chills  HENT: Negative for tinnitus.   Eyes: Positive for blurred vision.  Respiratory: Negative for cough.   Cardiovascular: Negative for chest pain.       No chest pain.  Increased shortness of breath with exertion.  Positive leg swelling  Gastrointestinal: Negative for nausea and vomiting.       Positive constipation.  Intermittent nausea.  Genitourinary: Negative for frequency.       Denies dysuria or hematuria.  Positive right flank pain  Musculoskeletal: Positive for back pain and neck pain.       Positive myalgias  Skin: Negative for rash.  Neurological: Positive for dizziness and focal weakness.  All other systems reviewed and are negative.  Past Medical History:  Diagnosis Date  . CKD (chronic kidney disease), stage III ([place])   . Congestive heart failure [facility]) May of 2011   Felt to have cor pulmonale; EF 45 to 50% from echo in May 2011  . Cor pulmonale (chronic) ([place])   . Gout    "take daily RX" (12/28/2017)  . Hyperlipidemia   . Hypertension   . Morbid obesity ([place])   . Persistent atrial fibrillation ([place])    [name] 12/28/2017  . Sleep apnea    "dx'd; couldn't tolerate CPAP" (12/28/2017)  . Thoracic aneurysm    a. 4.8cm thoracic aortic aneurysm by CT 2013.  [name] Thoracic aortic aneurysm ([place])    known/notes 12/28/2017  . Type II diabetes mellitus ([place])    Past Surgical History:  Procedure Laterality Date  . LAPAROSCOPIC CHOLECYSTECTOMY  2000  . US ECHOCARDIOGRAPHY  09/21/2009   EF 45-50%; Cavity size was severely dilated, severe concentric hypertrophy and normal wall motion  Family History  Problem Relation Age of Onset  . Hypertension Mother   . Pancreatic cancer Father   . Prostate cancer Father   . Colon cancer Father    Social History:  reports that he has never smoked. He has never used smokeless tobacco. He reports that he drank alcohol. He reports that he does not use drugs. Allergies: No Known Allergies Medications Prior to Admission  Medication  Sig Dispense Refill  . albuterol (PROVENTIL HFA;VENTOLIN HFA) 108 (90 BASE) MCG/ACT inhaler Inhale 2 puffs into the lungs every 6 (six) hours as needed for wheezing or shortness of breath.    . allopurinol (ZYLOPRIM) 100 MG tablet Take 1 tablet (100 mg total) by mouth daily. 90 tablet 0  . atorvastatin (LIPITOR) 40 MG tablet Take 1 tablet (40 mg total) by mouth daily. 90 tablet 3  . carvedilol (COREG) 6.25 MG tablet Take 1 tablet (6.25 mg total) by mouth 2 (two) times daily. 180 tablet 3  . cloNIDine (CATAPRES) 0.1 MG tablet Take 1 tab in the am and 2 tab every pm. (Patient taking differently: Take 0.1-0.2 mg by mouth See admin instructions. Take 1 tablet every morning and take 2 tablets every evening) 270 tablet 0  . dapagliflozin propanediol (FARXIGA) 5 MG TABS tablet Take 5 mg by mouth daily. 90 tablet 1  . furosemide (LASIX) 40 MG tablet Take 1 tablet (40 mg total) by mouth daily. 90 tablet 1  . magnesium oxide (MAG-OX) 400 MG tablet Take 1 tablet (400 mg total) by mouth daily. 90 tablet 1  . metFORMIN (GLUCOPHAGE) 1000 MG tablet Take 1 tablet (1,000 mg total) by mouth 2 (two) times daily with a meal. 180 tablet 1  . omega-3 acid ethyl esters (LOVAZA) 1 G capsule Take 2 capsules (2 g total) by mouth 2 (two) times daily. 120 capsule 11  . potassium chloride SA (KLOR-CON M20) 20 MEQ tablet Take 2 tablets (40 mEq total) by mouth daily. 180 tablet 1  . rivaroxaban (XARELTO) 20 MG TABS tablet Take 1 tablet (20 mg total) by mouth daily with supper. 90 tablet 1  . sacubitril-valsartan (ENTRESTO) 24-26 MG Take 1 tablet by mouth 2 (two) times daily. 60 tablet 5  . Exenatide ER (BYDUREON BCISE) 2 MG/0.85ML AUIJ Inject 1 Act into the skin once a week. (Patient not taking: Reported on 12/28/2017) 12 pen 1  . glucose blood (BAYER CONTOUR NEXT TEST) test strip Use BID 100 each 12  . Lancets Misc. (UNISTIK 1) MISC Test up to twice daily 50 each 11    Home: Home Living Family/patient expects to be discharged  to:: Private residence Living Arrangements: Spouse/significant other, Children Available Help at Discharge: Family, Available PRN/intermittently Type of Home: House Home Access: Stairs to enter Technical brewer of Steps: 3 Entrance Stairs-Rails: None Home Layout: Two level, Bed/bath upstairs, Able to live on main level with bedroom/bathroom Alternate Level Stairs-Number of Steps: 20 Alternate Level Stairs-Rails: Left Bathroom Shower/Tub: Tub/shower unit, Air cabin crew Accessibility: Yes  Functional History: Prior Function Level of Independence: Independent Comments: drives Functional Status:  Mobility: Bed Mobility Overal bed mobility: Needs Assistance Bed Mobility: Rolling Rolling: Max assist, +2 for physical assistance General bed mobility comments: maxAx2 for rolling to provide pericare and change linens        ADL:    Cognition: Cognition Overall Cognitive Status: Within Functional Limits for tasks assessed Orientation Level: Oriented X4 Cognition Arousal/Alertness: Awake/alert Behavior During Therapy: WFL for tasks assessed/performed Overall Cognitive Status: Within  Functional Limits for tasks assessed General Comments: unaware that he was soaked in urine  Blood pressure (!) 121/98, pulse 97, temperature 98.3 F (36.8 C), temperature source Oral, resp. rate 14, height 5\' 8"  (1.727 m), weight (!) 157.6 kg, SpO2 93 %. Physical Exam  Constitutional: No distress.  52 year old right-handed obese male  HENT:  Head: Normocephalic.  Eyes: Pupils are equal, round, and reactive to light.  Neck: Normal range of motion.  Cardiovascular: Normal rate.  Cardiac rate controlled  Respiratory: Effort normal.  Fair inspiratory effort.  Clear to auscultation  GI: Soft.  Musculoskeletal:  2+ LE edema  Neurological:  Alert.  No acute distress.  Follows full commands.  Oriented to person place and time. UE 4/5 prox to distal. LLE 2-/5 prox to  2/5 distal. RLE 2/5-3/5 prox to distal. Senses pain and LT in all 4.   Skin: He is not diaphoretic.  Psychiatric: He has a normal mood and affect. His behavior is normal. Judgment and thought content normal.    Results for orders placed or performed during the hospital encounter of 12/27/17 (from the past 24 hour(s))  Glucose, capillary     Status: Abnormal   Collection Time: 01/01/18  9:21 AM  Result Value Ref Range   Glucose-Capillary 112 (H) 70 - 99 mg/dL  Glucose, capillary     Status: Abnormal   Collection Time: 01/01/18 12:43 PM  Result Value Ref Range   Glucose-Capillary 149 (H) 70 - 99 mg/dL  Glucose, capillary     Status: Abnormal   Collection Time: 01/01/18  5:08 PM  Result Value Ref Range   Glucose-Capillary 229 (H) 70 - 99 mg/dL  Glucose, capillary     Status: Abnormal   Collection Time: 01/01/18  9:47 PM  Result Value Ref Range   Glucose-Capillary 178 (H) 70 - 99 mg/dL   Comment 1 Notify RN    Comment 2 Document in Chart    Mr Jodene Nam Head Wo Contrast  Result Date: 01/01/2018 CLINICAL DATA:  Stroke follow-up EXAM: MRA HEAD WITHOUT CONTRAST TECHNIQUE: Angiographic images of the Circle of Willis were obtained using MRA technique without intravenous contrast. COMPARISON:  Brain MRI yesterday FINDINGS: Symmetric carotid and vertebral arteries. Vessels are smooth and widely patent. Negative for aneurysm. IMPRESSION: Negative intracranial MRA. Electronically Signed   By: Monte Fantasia M.D.   On: 01/01/2018 08:32   Mr Brain Wo Contrast  Result Date: 12/31/2017 CLINICAL DATA:  Generalized weakness. EXAM: MRI HEAD WITHOUT CONTRAST TECHNIQUE: Multiplanar, multiecho pulse sequences of the brain and surrounding structures were obtained without intravenous contrast. COMPARISON:  Head CT 02/08/2015 FINDINGS: Brain: There is a small acute white matter infarct in the posterosuperior left temporal lobe adjacent to the lateral ventricle measuring 1.2 cm in maximal dimension. Multiple chronic  microhemorrhages are present in both cerebral hemispheres as well as in the right thalamus and right lentiform nucleus. Small foci of T2 hyperintensity in the cerebral white matter bilaterally are nonspecific but compatible with mild chronic small vessel ischemic disease. The ventricles and sulci are normal for age. No mass, midline shift, or extra-axial fluid collection is seen. Vascular: Major intracranial vascular flow voids are preserved. Skull and upper cervical spine: Unremarkable bone marrow signal. Sinuses/Orbits: Unremarkable orbits. Right larger than left maxillary sinus mucous retention cysts. Clear mastoid air cells. Other: None. IMPRESSION: 1. Small acute posterior left temporal lobe infarct. 2. Mild chronic small vessel ischemic disease and scattered chronic microhemorrhages. Electronically Signed   By: Seymour Bars.D.  On: 12/31/2017 11:30   Mr Cervical Spine Wo Contrast  Result Date: 01/01/2018 CLINICAL DATA:  Painful myelopathy EXAM: MRI CERVICAL SPINE WITHOUT CONTRAST TECHNIQUE: Multiplanar, multisequence MR imaging of the cervical spine was performed. No intravenous contrast was administered. COMPARISON:  Cervical spine CT 09/27/2009 FINDINGS: Alignment: Reversal of cervical lordosis with 1-2 mm of C3-4 anterolisthesis. Vertebrae: No fracture, evidence of discitis, or bone lesion. Cord: T2 hyperintensity about the compressed cord at C4-5. Posterior Fossa, vertebral arteries, paraspinal tissues: Trace prevertebral edema at C3 and C4, possibly reflecting recent injury. There is no indication of underlying bony infection or discitis. Disc levels: C2-3: Facet spurring.  Negative disc.  Foraminal narrowing is mild. C3-4: Facet spurring asymmetric to the left with mild anterolisthesis. Mild uncovertebral spurring. Foramina are patent. C4-5: Disc narrowing with central protrusion compressing the edematous cord. Cord compression is severe, with a 2 mm canal diameter in the midline. Disc height loss  and uncovertebral spurring causes right more than left foraminal impingement C5-6: Disc narrowing and endplate degeneration with bulge compressing the cord. Bilateral foraminal impingement from disc height loss and uncovertebral spurring. C6-7: Disc narrowing and endplate degeneration with posterior disc osteophyte complex contacting the ventral cord. Mild bilateral foraminal narrowing C7-T1:Minor facet spurring.  Mild left foraminal stenosis. Case discussed with Dr. Erlinda Hong via telephone at time of signing IMPRESSION: 1. C4-5 severe cord compression due to disc protrusion, with cord edema. 2. C5-6 degenerative cord compression. 3. C6-7 mild degenerative spinal stenosis. 4. Slight prevertebral edema, question recent trauma. No evidence of discitis. 5. Bilateral foraminal impingement at C4-5 and C5-6. Electronically Signed   By: Monte Fantasia M.D.   On: 01/01/2018 08:07   Mr Thoracic Spine Wo Contrast  Result Date: 01/01/2018 CLINICAL DATA:  Acute or progressive myelopathy. Worsening weakness in both legs in severe pain. EXAM: MRI THORACIC SPINE WITHOUT CONTRAST TECHNIQUE: Multiplanar, multisequence MR imaging of the thoracic spine was performed. No intravenous contrast was administered. COMPARISON:  None. FINDINGS: Alignment:  Normal thoracic alignment. Counting sequence of the cervical spine shows C3-4 facet mediated anterolisthesis and advanced disc degeneration at C4-5, C5-6, and C6-7. per the time line a cervical MRI has been ordered. Vertebrae: No fracture, evidence of discitis, or bone lesion. Cord:  Normal signal and morphology Paraspinal and other soft tissues: Intrinsic back muscle edema minimally seen in the lower thoracic spine, continuation of lumbar spine pathology as described previously. Disc levels: Generalized spondylosis. Disc height and hydration is well preserved. No notable facet arthropathy. Negative for cord impingement. There is symmetric diffuse foraminal patency. IMPRESSION: No acute  finding.  No impingement or visible myelopathy. Electronically Signed   By: Monte Fantasia M.D.   On: 01/01/2018 07:49   Mr Lumbar Spine Wo Contrast  Result Date: 12/31/2017 CLINICAL DATA:  Right flank pain for several days EXAM: MRI LUMBAR SPINE WITHOUT CONTRAST TECHNIQUE: Multiplanar, multisequence MR imaging of the lumbar spine was performed. No intravenous contrast was administered. COMPARISON:  None. FINDINGS: Segmentation:  Standard. Alignment:  Physiologic. Vertebrae:  No fracture, evidence of discitis, or bone lesion. Conus medullaris and cauda equina: Conus extends to the T12 level. Conus and cauda equina appear normal. Paraspinal and other soft tissues: No acute paraspinal abnormality. There is muscle edema in the multifidus muscle at the level of L4-5, left greater than right likely reflecting muscle strain versus an inflammatory etiology. Disc levels: Disc spaces: Disc spaces are maintained. T12-L1: No significant disc bulge. No evidence of neural foraminal stenosis. No central canal stenosis. L1-L2: No  significant disc bulge. No evidence of neural foraminal stenosis. No central canal stenosis. L2-L3: No significant disc bulge. No evidence of neural foraminal stenosis. No central canal stenosis. L3-L4: Minimal broad-based disc bulge. No evidence of neural foraminal stenosis. No central canal stenosis. L4-L5: Broad-based disc bulge. Moderate bilateral facet arthropathy with facet effusions. Moderate spinal stenosis and bilateral lateral recess stenosis. Moderate left foraminal stenosis. Mild right foraminal stenosis. L5-S1: No significant disc bulge. No central canal stenosis. Moderate right facet arthropathy. 9 mm right facet synovial cyst projecting into the right neural foramen and resulting in moderate foraminal stenosis. IMPRESSION: 1. At L4-5 there is a broad-based disc bulge. Moderate bilateral facet arthropathy with facet effusions. Moderate spinal stenosis and bilateral lateral recess  stenosis. Moderate left foraminal stenosis. Mild right foraminal stenosis. 2. At L5-S1 there is moderate right facet arthropathy. 9 mm right facet synovial cyst projecting into the right neural foramen and resulting in moderate foraminal stenosis. 3. There is muscle edema in the multifidus muscle at the level of L4-5, left greater than right likely reflecting muscle strain versus an inflammatory etiology. Electronically Signed   By: Kathreen Devoid   On: 12/31/2017 12:34     Assessment/Plan: Diagnosis: cervical stenosis with cord compression, lumbar spondylosis with radiculopathy, left temporal lobe infarct 1. Does the need for close, 24 hr/day medical supervision in concert with the patient's rehab needs make it unreasonable for this patient to be served in a less intensive setting? Yes 2. Co-Morbidities requiring supervision/potential complications: morbid obesity, afib, chf, CM 3. Due to bladder management, bowel management, safety, skin/wound care, disease management, medication administration, pain management and patient education, does the patient require 24 hr/day rehab nursing? Yes 4. Does the patient require coordinated care of a physician, rehab nurse, PT (1-2 hrs/day, 5 days/week) and OT (1-2 hrs/day, 5 days/week) to address physical and functional deficits in the context of the above medical diagnosis(es)? Yes Addressing deficits in the following areas: balance, endurance, locomotion, strength, transferring, bowel/bladder control, bathing, dressing, feeding, grooming, toileting, cognition and psychosocial support 5. Can the patient actively participate in an intensive therapy program of at least 3 hrs of therapy per day at least 5 days per week? Yes 6. The potential for patient to make measurable gains while on inpatient rehab is excellent 7. Anticipated functional outcomes upon discharge from inpatient rehab are supervision and min assist  with PT, supervision and min assist with OT, n/a with  SLP. 8. Estimated rehab length of stay to reach the above functional goals is: 20-24 days 9. Anticipated D/C setting: Home 10. Anticipated post D/C treatments: HH therapy and Outpatient therapy 11. Overall Rehab/Functional Prognosis: excellent  RECOMMENDATIONS: This patient's condition is appropriate for continued rehabilitative care in the following setting: CIR Patient has agreed to participate in recommended program. Yes Note that insurance prior authorization may be required for reimbursement for recommended care.  Comment: Rehab Admissions Coordinator to follow up.  Thanks,  Meredith Staggers, MD, Mellody Drown  I have personally performed a face to face diagnostic evaluation of this patient. Additionally, I have reviewed and concur with the physician assistant's documentation above.    Lavon Paganini Angiulli, PA-C 01/02/2018

## 2018-01-02 NOTE — Progress Notes (Signed)
PROGRESS NOTE    Jay Chen  ZMO:[id number] DOB: [birth year] DOA: 12/27/2017 PCP: [name], MD  Brief Narrative:52 year old male with past medical history of systolic CHF with ejection fraction of 20 to 25%, diabetes, hypertension, hyperlipidemia, morbid obesity, obstructive sleep apnea, pulmonary hypertension, right-sided heart failure who presented with right-sided flank pain.  Patient also reported of some shortness of breath but no chest pain and  bilateral lower extremity edema.  CT imaging done in the emergency department did show nonobstructive renal calculi.  Patient was also found to have acute kidney injury on his CKD.  Chest x-ray showed cardiomegaly and mild vascular congestion.  Cardiology has been consulted and following.  Assessment & Plan:   Principal Problem:   Atrial fibrillation with rapid ventricular response (HCC) Active Problems:   Obstructive sleep apnea   AKI (acute kidney injury) (HCC)   Flank pain   Chronic combined systolic and diastolic heart failure (HCC)   DCM (dilated cardiomyopathy) (HCC)   PNA (pneumonia)   Cerebral embolism with cerebral infarction   Acute decompensated heart failure (HCC)   Lumbar radiculopathy   Cervical spinal cord compression (HCC)  Cervical spine cord compression and lumbar radiculopathy- bilateral lower extremity weakness, denies not have any urinary or bowel incontinence.  - MRI C-spine cervical C4-5 disc protrusion, with cord compression and cord edema - MRI L-spine L4-5 disc bulging with spinal stenosis - MRI T-spine negative - MRI Brain small acute posterior temporal lobe infarct,probable embolic.  -Neuro surgery, Dr. Salley Scarlet recs, conservative therapy for lumbar spondylosis.  Patient is symptomatic from cervical stenosis-Neurosurg will address this as outpatient, will need surgical decompression for a C5 corpectomy. -Neurology following appreciate recommendations -Decadron 10 mg x 1 given,  Monitor blood sugars with DM. - PT recommends CIR.  - check hba1c. - check echo 01/01/18-EF 20 to 25%, moderately to severely dilated, diffuse hypokinesis. - .lipid panel-LDL 75, HDL 24.  CVA-small acute posterior left temporal lobe infarct, likely embolic from A. Fib. -Neurology following Dr Erlinda Hong,  appreciate recommendations, continue Xarelto, pending final recommendations  Hypoxic respiratory failure/ acute on chronic systolic heart failure/ LLL pneumonia- with ejection fraction 20 to 25%.  Initial chest x-ray with pulmonary edema, elevated BNP. On xarelto- doubt PE. likely element of OHS and OSA.  - Chest x-ray with pulmonary edema, Lasix 40 mg daily. -Continue IV Rocephin and doxycycline, D#4/7 -If no significant improvement. consider chest CT -Strict input output Daily weights BMP a.m. - lasix ^'d to 40 mg bid today 8/12 per cards - down 8kg approx from admission  AKI on CKD stage III.  Cr- 2.7 >> 1.9.  Improving with resumption of Lasix.  Delene Loll held  - lasix po ^'d to 40 bid today per cards   A. fib RVR -rate controlled.  On anticoagulation. -Cardiology following, recs apprecaited-consider DCCV prior to discharge, but will wait to see if anything needs to be done regarding findings on MRI of spine, as he would not be able to come off anticoagulation for 4 weeks after DCCV. - on Coreg and PO amiodarone per cardiology.  - Cont Xarelto.  OSA- not plans with CPAP at home. - CPAP QHS  DM2 . Hgba1c-- 8.4 - Metformin and Farxiga at home held for AKI - SS1  morbid obesity obstructive sleep apnea and pulmonary hypertension patient noncompliant with CPAP encouraged to use while in the hospital.  Thoracic aortic aneurysm need outpatient follow-up.  Hypokalemia resolved.ON DAILY KDUR with lasix.  hyperlipidemia continue Lipitor.  Rob  Brionna Romanek MD [facility] pgr [phone] 10/16/2017, 9:25 AM      DVT prophylaxis: Xarelto Code Status: Full code Family  Communication: No family available patient lives at home with family. Disposition Plan: TBD Consultants:  Cardiology Procedures: None Antimicrobials: Rocephin and doxycycline 8/9 >>  Subjective: no new c/o's, swelling in legs getting better, breathing getting better. .  Objective: Vitals:   01/02/18 0409 01/02/18 0500 01/02/18 0812 01/02/18 1212  BP: (!) 121/98  (!) 136/110 117/83  Pulse: 97  (!) 112 94  Resp: 14  (!) 22 17  Temp:   98 F (36.7 C) 98.8 F (37.1 C)  TempSrc: Oral  Oral Oral  SpO2: 93%  98% 95%  Weight:  (!) 155.1 kg    Height:        Intake/Output Summary (Last 24 hours) at 01/02/2018 1620 Last data filed at 01/02/2018 1500 Gross per 24 hour  Intake 707.55 ml  Output 1050 ml  Net -342.45 ml   Filed Weights   12/30/17 0631 12/31/17 0746 01/02/18 0500  Weight: (!) 157 kg (!) 157.6 kg (!) 155.1 kg    Examination: Morbidly obese gentleman in no acute distress resting in bed. General exam: Appears calm and comfortable  Respiratory system: Clear to auscultation. Respiratory effort normal. Cardiovascular system: S1 & S2 heard, RRR. No JVD, murmurs, rubs, gallops or clicks. Gastrointestinal system: Abdomen obese, is nondistended, soft and nontender. No organomegaly or masses felt. Normal bowel sounds heard. Central nervous system: Alert and oriented. No focal neurological deficits. Extremities trace pitting pedal edema bilaterally. Skin: No rashes, lesions or ulcers Psychiatry: Judgement and insight appear normal. Mood & affect appropriate.   Data Reviewed: I have personally reviewed following labs and imaging studies  CBC: Recent Labs  Lab 12/27/17 2354 12/29/17 0511 12/31/17 0443 01/01/18 0159  WBC 9.8 10.1 9.1 8.1  NEUTROABS  --   --  6.6 5.7  HGB 13.9 12.9* 13.0 12.4*  HCT 41.9 38.4* 39.9 38.4*  MCV 94.4 94.1 96.1 96.2  PLT 242 216 227 902   Basic Metabolic Panel: Recent Labs  Lab 12/29/17 0511 12/30/17 0550 12/31/17 0443 01/01/18 0159  01/02/18 0436  NA 136 137 138 138 140  K 3.1* 3.4* 3.6 3.7 4.0  CL 90* 90* 92* 93* 94*  CO2 31 34* 33* 34* 32  GLUCOSE 162* 151* 139* 121* 190*  BUN 61* 60* 57* 54* 60*  CREATININE 2.60* 2.31* 2.18* 1.98* 1.73*  CALCIUM 8.7* 9.0 9.1 8.9 9.3   CBG: Recent Labs  Lab 01/01/18 1243 01/01/18 1708 01/01/18 2147 01/02/18 0727 01/02/18 1208  GLUCAP 149* 229* 178* 152* 173*   Lipid Profile: Recent Labs    01/01/18 0159  CHOL 116  HDL 24*  LDLCALC 75  TRIG 85  CHOLHDL 4.8    Recent Results (from the past 240 hour(s))  MRSA PCR Screening     Status: None   Collection Time: 12/31/17  5:05 AM  Result Value Ref Range Status   MRSA by PCR NEGATIVE NEGATIVE Final    Comment:        The GeneXpert MRSA Assay (FDA approved for NASAL specimens only), is one component of a comprehensive MRSA colonization surveillance program. It is not intended to diagnose MRSA infection nor to guide or monitor treatment for MRSA infections. Performed at [facility], [address], [place], [place] [address]      Radiology Studies: Mr Virgel Paling ZH Contrast  Result Date: 01/01/2018 CLINICAL DATA:  Stroke follow-up EXAM: MRA HEAD WITHOUT CONTRAST TECHNIQUE: Angiographic images of the Circle of Willis were obtained using MRA technique without intravenous contrast. COMPARISON:  Brain MRI yesterday FINDINGS: Symmetric carotid and vertebral arteries. Vessels are smooth and widely patent. Negative for aneurysm. IMPRESSION: Negative intracranial MRA. Electronically Signed   By: Monte Fantasia M.D.   On: 01/01/2018 08:32   Mr Cervical Spine Wo Contrast  Result Date: 01/01/2018 CLINICAL DATA:  Painful myelopathy EXAM: MRI CERVICAL SPINE WITHOUT CONTRAST TECHNIQUE: Multiplanar, multisequence MR imaging of the cervical spine was performed. No intravenous contrast was administered. COMPARISON:  Cervical spine CT 09/27/2009 FINDINGS: Alignment: Reversal of cervical lordosis with 1-2 mm of C3-4  anterolisthesis. Vertebrae: No fracture, evidence of discitis, or bone lesion. Cord: T2 hyperintensity about the compressed cord at C4-5. Posterior Fossa, vertebral arteries, paraspinal tissues: Trace prevertebral edema at C3 and C4, possibly reflecting recent injury. There is no indication of underlying bony infection or discitis. Disc levels: C2-3: Facet spurring.  Negative disc.  Foraminal narrowing is mild. C3-4: Facet spurring asymmetric to the left with mild anterolisthesis. Mild uncovertebral spurring. Foramina are patent. C4-5: Disc narrowing with central protrusion compressing the edematous cord. Cord compression is severe, with a 2 mm canal diameter in the midline. Disc height loss and uncovertebral spurring causes right more than left foraminal impingement C5-6: Disc narrowing and endplate degeneration with bulge compressing the cord. Bilateral foraminal impingement from disc height loss and uncovertebral spurring. C6-7: Disc narrowing and endplate degeneration with posterior disc osteophyte complex contacting the ventral cord. Mild bilateral foraminal narrowing C7-T1:Minor facet spurring.  Mild left foraminal stenosis. Case discussed with Dr. Erlinda Hong via telephone at time of signing IMPRESSION: 1. C4-5 severe cord compression due to disc protrusion, with cord edema. 2. C5-6 degenerative cord compression. 3. C6-7 mild degenerative spinal stenosis. 4. Slight prevertebral edema, question recent trauma. No evidence of discitis. 5. Bilateral foraminal impingement at C4-5 and C5-6. Electronically Signed   By: Monte Fantasia M.D.   On: 01/01/2018 08:07   Mr Thoracic Spine Wo Contrast  Result Date: 01/01/2018 CLINICAL DATA:  Acute or progressive myelopathy. Worsening weakness in both legs in severe pain. EXAM: MRI THORACIC SPINE WITHOUT CONTRAST TECHNIQUE: Multiplanar, multisequence MR imaging of the thoracic spine was performed. No intravenous contrast was administered. COMPARISON:  None. FINDINGS: Alignment:   Normal thoracic alignment. Counting sequence of the cervical spine shows C3-4 facet mediated anterolisthesis and advanced disc degeneration at C4-5, C5-6, and C6-7. per the time line a cervical MRI has been ordered. Vertebrae: No fracture, evidence of discitis, or bone lesion. Cord:  Normal signal and morphology Paraspinal and other soft tissues: Intrinsic back muscle edema minimally seen in the lower thoracic spine, continuation of lumbar spine pathology as described previously. Disc levels: Generalized spondylosis. Disc height and hydration is well preserved. No notable facet arthropathy. Negative for cord impingement. There is symmetric diffuse foraminal patency. IMPRESSION: No acute finding.  No impingement or visible myelopathy. Electronically Signed   By: Monte Fantasia M.D.   On: 01/01/2018 07:49   Scheduled Meds: . amiodarone  200 mg Oral BID  . atorvastatin  80 mg Oral Daily  . doxycycline  100 mg Oral Q12H  . feeding supplement (ENSURE ENLIVE)  237 mL Oral BID BM  . furosemide  40 mg Oral BID  . insulin aspart  0-5 Units Subcutaneous QHS  . insulin aspart  0-9 Units Subcutaneous TID WC  . mouth rinse  15 mL Mouth Rinse BID  . methocarbamol  500 mg Oral TID  . metoprolol succinate  50 mg Oral BID  . omega-3 acid ethyl esters  2 g Oral BID  . potassium chloride SA  40 mEq Oral Daily  . rivaroxaban  20 mg Oral Q supper  . senna-docusate  2 tablet Oral QHS  . sodium chloride flush  3 mL Intravenous Q12H   Continuous Infusions: . sodium chloride 250 mL (12/30/17 1724)  . cefTRIAXone (ROCEPHIN)  IV Stopped (01/01/18 1645)     LOS: 5 days

## 2018-01-02 NOTE — Progress Notes (Signed)
Progress Note  Patient Name: Jay Chen Date of Encounter: 01/02/2018  Primary Cardiologist: Quay Burow, MD   Subjective   Feeling better.  Breathing is improving.  No chest pain or pressure.   Inpatient Medications    Scheduled Meds: . amiodarone  200 mg Oral BID  . atorvastatin  80 mg Oral Daily  . carvedilol  6.25 mg Oral BID  . doxycycline  100 mg Oral Q12H  . feeding supplement (ENSURE ENLIVE)  237 mL Oral BID BM  . furosemide  40 mg Oral Daily  . insulin aspart  0-5 Units Subcutaneous QHS  . insulin aspart  0-9 Units Subcutaneous TID WC  . mouth rinse  15 mL Mouth Rinse BID  . methocarbamol  500 mg Oral TID  . omega-3 acid ethyl esters  2 g Oral BID  . potassium chloride SA  40 mEq Oral Daily  . rivaroxaban  20 mg Oral Q supper  . senna-docusate  2 tablet Oral QHS  . sodium chloride flush  3 mL Intravenous Q12H   Continuous Infusions: . sodium chloride 250 mL (12/30/17 1724)  . cefTRIAXone (ROCEPHIN)  IV Stopped (01/01/18 1645)   PRN Meds: sodium chloride, acetaminophen **OR** acetaminophen, albuterol, ondansetron **OR** ondansetron (ZOFRAN) IV, oxyCODONE, polyethylene glycol, sodium chloride flush   Vital Signs    Vitals:   01/01/18 2024 01/02/18 0007 01/02/18 0409 01/02/18 0500  BP: 138/90 103/79 (!) 121/98   Pulse: (!) 101 (!) 104 97   Resp: 19 13 14    Temp:  98.3 F (36.8 C)    TempSrc:  Oral Oral   SpO2: 94% 93% 93%   Weight:    (!) 155.1 kg  Height:        Intake/Output Summary (Last 24 hours) at 01/02/2018 1051 Last data filed at 01/02/2018 0900 Gross per 24 hour  Intake 527.55 ml  Output 800 ml  Net -272.45 ml   Filed Weights   12/30/17 0631 12/31/17 0746 01/02/18 0500  Weight: (!) 157 kg (!) 157.6 kg (!) 155.1 kg    Telemetry    Atrial fibrillation.  Rate 90s-110s.  PVCs.  - Personally Reviewed  ECG    01/02/18: Atrial fibrillation.  Rate 110 bpm.  PVCs.  R axis deviation. - Personally Reviewed  Physical Exam   VS:  BP  (!) 121/98 (BP Location: Left Arm)   Pulse 97   Temp 98.3 F (36.8 C) (Oral)   Resp 14   Ht 5\' 8"  (1.727 m)   Wt (!) 155.1 kg   SpO2 93%   BMI 51.99 kg/m   , BMI Body mass index is 51.99 kg/m. GENERAL:  Well appearing.  Morbidly obese HEENT: Pupils equal round and reactive, fundi not visualized, oral mucosa unremarkable NECK:  JVD difficult to assess. Carotid upstroke brisk and symmetric, no bruits, no thyromegaly LYMPHATICS:  No cervical adenopathy LUNGS:  Clear to auscultation bilaterally HEART:  Irregularly irregular.  Tachycardic.  PMI not displaced or sustained,S1 and S2 within normal limits, no S3, no S4, no clicks, no rubs, no murmurs ABD:  Flat, positive bowel sounds normal in frequency in pitch, no bruits, no rebound, no guarding, no midline pulsatile mass, no hepatomegaly, no splenomegaly EXT:  2 plus pulses throughout, trace edema, no cyanosis no clubbing SKIN:  No rashes no nodules NEURO:  Cranial nerves II through XII grossly intact, motor grossly intact throughout PSYCH:  Cognitively intact, oriented to person place and time   [employer] Recent  Labs  Lab 12/31/17 0443 01/01/18 0159 01/02/18 0436  NA 138 138 140  K 3.6 3.7 4.0  CL 92* 93* 94*  CO2 33* 34* 32  GLUCOSE 139* 121* 190*  BUN 57* 54* 60*  CREATININE 2.18* 1.98* 1.73*  CALCIUM 9.1 8.9 9.3  GFRNONAA 33* 37* 43*  GFRAA 38* 43* 50*  ANIONGAP 13 11 14      Hematology Recent Labs  Lab 12/29/17 0511 12/31/17 0443 01/01/18 0159  WBC 10.1 9.1 8.1  RBC 4.08* 4.15* 3.99*  HGB 12.9* 13.0 12.4*  HCT 38.4* 39.9 38.4*  MCV 94.1 96.1 96.2  MCH 31.6 31.3 31.1  MCHC 33.6 32.6 32.3  RDW 13.5 13.8 13.6  PLT 216 227 234    Cardiac EnzymesNo results for input(s): TROPONINI in the last 168 hours. No results for input(s): TROPIPOC in the last 168 hours.   BNP Recent Labs  Lab 12/27/17 2354  BNP 283.3*     DDimer No results for input(s): DDIMER in the last 168 hours.   Radiology    Mr  Jodene Nam Head Wo Contrast  Result Date: 01/01/2018 CLINICAL DATA:  Stroke follow-up EXAM: MRA HEAD WITHOUT CONTRAST TECHNIQUE: Angiographic images of the Circle of Willis were obtained using MRA technique without intravenous contrast. COMPARISON:  Brain MRI yesterday FINDINGS: Symmetric carotid and vertebral arteries. Vessels are smooth and widely patent. Negative for aneurysm. IMPRESSION: Negative intracranial MRA. Electronically Signed   By: Monte Fantasia M.D.   On: 01/01/2018 08:32   Mr Brain Wo Contrast  Result Date: 12/31/2017 CLINICAL DATA:  Generalized weakness. EXAM: MRI HEAD WITHOUT CONTRAST TECHNIQUE: Multiplanar, multiecho pulse sequences of the brain and surrounding structures were obtained without intravenous contrast. COMPARISON:  Head CT 02/08/2015 FINDINGS: Brain: There is a small acute white matter infarct in the posterosuperior left temporal lobe adjacent to the lateral ventricle measuring 1.2 cm in maximal dimension. Multiple chronic microhemorrhages are present in both cerebral hemispheres as well as in the right thalamus and right lentiform nucleus. Small foci of T2 hyperintensity in the cerebral white matter bilaterally are nonspecific but compatible with mild chronic small vessel ischemic disease. The ventricles and sulci are normal for age. No mass, midline shift, or extra-axial fluid collection is seen. Vascular: Major intracranial vascular flow voids are preserved. Skull and upper cervical spine: Unremarkable bone marrow signal. Sinuses/Orbits: Unremarkable orbits. Right larger than left maxillary sinus mucous retention cysts. Clear mastoid air cells. Other: None. IMPRESSION: 1. Small acute posterior left temporal lobe infarct. 2. Mild chronic small vessel ischemic disease and scattered chronic microhemorrhages. Electronically Signed   By: Logan Bores M.D.   On: 12/31/2017 11:30   Mr Cervical Spine Wo Contrast  Result Date: 01/01/2018 CLINICAL DATA:  Painful myelopathy EXAM: MRI  CERVICAL SPINE WITHOUT CONTRAST TECHNIQUE: Multiplanar, multisequence MR imaging of the cervical spine was performed. No intravenous contrast was administered. COMPARISON:  Cervical spine CT 09/27/2009 FINDINGS: Alignment: Reversal of cervical lordosis with 1-2 mm of C3-4 anterolisthesis. Vertebrae: No fracture, evidence of discitis, or bone lesion. Cord: T2 hyperintensity about the compressed cord at C4-5. Posterior Fossa, vertebral arteries, paraspinal tissues: Trace prevertebral edema at C3 and C4, possibly reflecting recent injury. There is no indication of underlying bony infection or discitis. Disc levels: C2-3: Facet spurring.  Negative disc.  Foraminal narrowing is mild. C3-4: Facet spurring asymmetric to the left with mild anterolisthesis. Mild uncovertebral spurring. Foramina are patent. C4-5: Disc narrowing with central protrusion compressing the edematous cord. Cord compression is severe, with a 2  mm canal diameter in the midline. Disc height loss and uncovertebral spurring causes right more than left foraminal impingement C5-6: Disc narrowing and endplate degeneration with bulge compressing the cord. Bilateral foraminal impingement from disc height loss and uncovertebral spurring. C6-7: Disc narrowing and endplate degeneration with posterior disc osteophyte complex contacting the ventral cord. Mild bilateral foraminal narrowing C7-T1:Minor facet spurring.  Mild left foraminal stenosis. Case discussed with Dr. Erlinda Hong via telephone at time of signing IMPRESSION: 1. C4-5 severe cord compression due to disc protrusion, with cord edema. 2. C5-6 degenerative cord compression. 3. C6-7 mild degenerative spinal stenosis. 4. Slight prevertebral edema, question recent trauma. No evidence of discitis. 5. Bilateral foraminal impingement at C4-5 and C5-6. Electronically Signed   By: Monte Fantasia M.D.   On: 01/01/2018 08:07   Mr Thoracic Spine Wo Contrast  Result Date: 01/01/2018 CLINICAL DATA:  Acute or  progressive myelopathy. Worsening weakness in both legs in severe pain. EXAM: MRI THORACIC SPINE WITHOUT CONTRAST TECHNIQUE: Multiplanar, multisequence MR imaging of the thoracic spine was performed. No intravenous contrast was administered. COMPARISON:  None. FINDINGS: Alignment:  Normal thoracic alignment. Counting sequence of the cervical spine shows C3-4 facet mediated anterolisthesis and advanced disc degeneration at C4-5, C5-6, and C6-7. per the time line a cervical MRI has been ordered. Vertebrae: No fracture, evidence of discitis, or bone lesion. Cord:  Normal signal and morphology Paraspinal and other soft tissues: Intrinsic back muscle edema minimally seen in the lower thoracic spine, continuation of lumbar spine pathology as described previously. Disc levels: Generalized spondylosis. Disc height and hydration is well preserved. No notable facet arthropathy. Negative for cord impingement. There is symmetric diffuse foraminal patency. IMPRESSION: No acute finding.  No impingement or visible myelopathy. Electronically Signed   By: Monte Fantasia M.D.   On: 01/01/2018 07:49   Mr Lumbar Spine Wo Contrast  Result Date: 12/31/2017 CLINICAL DATA:  Right flank pain for several days EXAM: MRI LUMBAR SPINE WITHOUT CONTRAST TECHNIQUE: Multiplanar, multisequence MR imaging of the lumbar spine was performed. No intravenous contrast was administered. COMPARISON:  None. FINDINGS: Segmentation:  Standard. Alignment:  Physiologic. Vertebrae:  No fracture, evidence of discitis, or bone lesion. Conus medullaris and cauda equina: Conus extends to the T12 level. Conus and cauda equina appear normal. Paraspinal and other soft tissues: No acute paraspinal abnormality. There is muscle edema in the multifidus muscle at the level of L4-5, left greater than right likely reflecting muscle strain versus an inflammatory etiology. Disc levels: Disc spaces: Disc spaces are maintained. T12-L1: No significant disc bulge. No evidence  of neural foraminal stenosis. No central canal stenosis. L1-L2: No significant disc bulge. No evidence of neural foraminal stenosis. No central canal stenosis. L2-L3: No significant disc bulge. No evidence of neural foraminal stenosis. No central canal stenosis. L3-L4: Minimal broad-based disc bulge. No evidence of neural foraminal stenosis. No central canal stenosis. L4-L5: Broad-based disc bulge. Moderate bilateral facet arthropathy with facet effusions. Moderate spinal stenosis and bilateral lateral recess stenosis. Moderate left foraminal stenosis. Mild right foraminal stenosis. L5-S1: No significant disc bulge. No central canal stenosis. Moderate right facet arthropathy. 9 mm right facet synovial cyst projecting into the right neural foramen and resulting in moderate foraminal stenosis. IMPRESSION: 1. At L4-5 there is a broad-based disc bulge. Moderate bilateral facet arthropathy with facet effusions. Moderate spinal stenosis and bilateral lateral recess stenosis. Moderate left foraminal stenosis. Mild right foraminal stenosis. 2. At L5-S1 there is moderate right facet arthropathy. 9 mm right facet synovial cyst projecting  into the right neural foramen and resulting in moderate foraminal stenosis. 3. There is muscle edema in the multifidus muscle at the level of L4-5, left greater than right likely reflecting muscle strain versus an inflammatory etiology. Electronically Signed   By: Kathreen Devoid   On: 12/31/2017 12:34    Cardiac Studies   Echo 01/01/18:  Study Conclusions  - Left ventricle: The cavity size was moderately to severely   dilated. Wall thickness was increased in a pattern of moderate   LVH. Systolic function was severely reduced. The estimated   ejection fraction was in the range of 20% to 25%. Diffuse   hypokinesis. The study is not technically sufficient to allow   evaluation of LV diastolic function. - Aortic valve: Mildly calcified annulus. Valve area (VTI): 4.07   cm^2. Valve  area (Vmax): 4.14 cm^2. Valve area (Vmean): 4.16   cm^2. - Aorta: There is a single still frame view of a portion of the   proximal ascending that is dilated measuring 4.7 cm, consistent   with moderate to large aneurysm. From chart review a similar   aneurysm was detected by CT scan 03/28/2012. Limited evaluation by   echo, consider alternative modality if more complete evaluation   is indicated. - Left atrium: The atrium was severely dilated. - Right ventricle: The cavity size was mildly dilated. Systolic   function was mildly reduced. - Right atrium: The atrium was mildly to moderately dilated. - Atrial septum: No defect or patent foramen ovale was identified. - Inferior vena cava: The vessel was dilated. The respirophasic   diameter changes were blunted (< 50%), consistent with elevated   central venous pressure. - Technically difficult study. Echocontrast was used to enhance   visualization.  Patient Profile     52 y.o. male with chronic systolic and diastolic heart failure (LVEF 20 to 25%, hypertension, hyperlipidemia, diabetes, morbid obesity, OSA, pulmonary hypertension, and right heart failure here with acute on chronic heart failure, pneumonia, and acute stroke.  Assessment & Plan    # Persistent atrial fibrillation: His heart rate remains poorly-controlled.  He is currently on amiodarone and carvedilol.  Given his acute stroke this admission I would be hesitant to perform a cardioversion on him without TEE despite the fact that he reports compliance with Xarelto.  Therefore, would recommend continuing with more aggressive rate control.  We will stop carvedilol and start metoprolol succinate 50 mg twice daily.  If he tolerates this dose we will consolidate to once daily dosing prior to discharge.  He already received one dose of carvedilol this AM.  Will give one dose of metoprolol tartrate 25mg  now.  Continue Xarelto.   # Chronic systolic and diastolic heart failure: LVEF  20-25%.  He appears to be euvolemic but his renal function is improving with IV Lasix.  Volume status is difficult to assess. recorded.  His weight is down from 166 kg to 155 kg currently.  His IVC was dilated on echo 8/11, suggesting he is still volume overloaded.  We will increase lasix to bid.  He will need his advanced HF clinic appointment rescheduled at discharge.    # Hyperlipidemia:  Continue atorvastatin 80mg  daily.    # CAP: Per IM.      For questions or updates, please contact [place] Please consult [email or web address] for contact info under Cardiology/STEMI.      Signed, Skeet Latch, MD  01/02/2018, 10:51 AM

## 2018-01-03 ENCOUNTER — Encounter (HOSPITAL_COMMUNITY): Payer: Self-pay | Admitting: Nephrology

## 2018-01-03 ENCOUNTER — Inpatient Hospital Stay (HOSPITAL_COMMUNITY): Payer: BC Managed Care – PPO

## 2018-01-03 DIAGNOSIS — J181 Lobar pneumonia, unspecified organism: Secondary | ICD-10-CM

## 2018-01-03 DIAGNOSIS — J9601 Acute respiratory failure with hypoxia: Secondary | ICD-10-CM

## 2018-01-03 LAB — BASIC METABOLIC PANEL
ANION GAP: 11 (ref 5–15)
BUN: 63 mg/dL — ABNORMAL HIGH (ref 6–20)
CALCIUM: 9.4 mg/dL (ref 8.9–10.3)
CO2: 34 mmol/L — ABNORMAL HIGH (ref 22–32)
Chloride: 94 mmol/L — ABNORMAL LOW (ref 98–111)
Creatinine, Ser: 1.8 mg/dL — ABNORMAL HIGH (ref 0.61–1.24)
GFR calc non Af Amer: 41 mL/min — ABNORMAL LOW (ref >60)
GFR, EST AFRICAN AMERICAN: 48 mL/min — AB (ref >60)
Glucose, Bld: 148 mg/dL — ABNORMAL HIGH (ref 70–99)
POTASSIUM: 3.8 mmol/L (ref 3.5–5.1)
Sodium: 139 mmol/L (ref 135–145)

## 2018-01-03 LAB — GLUCOSE, CAPILLARY
GLUCOSE-CAPILLARY: 127 mg/dL — AB (ref 70–99)
GLUCOSE-CAPILLARY: 163 mg/dL — AB (ref 70–99)
GLUCOSE-CAPILLARY: 112 mg/dL — AB (ref 70–99)
GLUCOSE-CAPILLARY: 142 mg/dL — AB (ref 70–99)

## 2018-01-03 MED ORDER — METOPROLOL SUCCINATE ER 100 MG PO TB24
100.0000 mg | ORAL_TABLET | Freq: Every day | ORAL | Status: DC
  Administered 2018-01-04: 100 mg via ORAL
  Filled 2018-01-03: qty 1

## 2018-01-03 MED ORDER — METOPROLOL SUCCINATE ER 50 MG PO TB24
50.0000 mg | ORAL_TABLET | Freq: Two times a day (BID) | ORAL | Status: AC
  Administered 2018-01-03: 50 mg via ORAL
  Filled 2018-01-03: qty 1

## 2018-01-03 MED ORDER — CEFDINIR 300 MG PO CAPS
300.0000 mg | ORAL_CAPSULE | Freq: Two times a day (BID) | ORAL | Status: DC
  Administered 2018-01-03 – 2018-01-04 (×2): 300 mg via ORAL
  Filled 2018-01-03 (×3): qty 1

## 2018-01-03 MED ORDER — FUROSEMIDE 40 MG PO TABS
40.0000 mg | ORAL_TABLET | Freq: Every day | ORAL | Status: DC
  Administered 2018-01-04: 40 mg via ORAL
  Filled 2018-01-03: qty 1

## 2018-01-03 MED ORDER — ORAL CARE MOUTH RINSE
15.0000 mL | Freq: Two times a day (BID) | OROMUCOSAL | Status: DC
  Administered 2018-01-03 – 2018-01-04 (×2): 15 mL via OROMUCOSAL

## 2018-01-03 NOTE — Progress Notes (Signed)
Progress Note  Patient Name: Jay Chen Date of Encounter: 01/03/2018  Primary Cardiologist: Quay Burow, MD   Subjective   Feeling better.  Breathing is improving.  No chest pain or pressure.  Disappointed that he was weak when working with PT.  Inpatient Medications    Scheduled Meds: . amiodarone  200 mg Oral BID  . atorvastatin  80 mg Oral Daily  . cefdinir  300 mg Oral Q12H  . doxycycline  100 mg Oral Q12H  . feeding supplement (ENSURE ENLIVE)  237 mL Oral BID BM  . [START ON 01/04/2018] furosemide  40 mg Oral Daily  . insulin aspart  0-5 Units Subcutaneous QHS  . insulin aspart  0-9 Units Subcutaneous TID WC  . mouth rinse  15 mL Mouth Rinse BID  . methocarbamol  500 mg Oral TID  . metoprolol succinate  50 mg Oral BID  . omega-3 acid ethyl esters  2 g Oral BID  . potassium chloride SA  40 mEq Oral Daily  . rivaroxaban  20 mg Oral Q supper  . senna-docusate  2 tablet Oral QHS  . sodium chloride flush  3 mL Intravenous Q12H   Continuous Infusions: . sodium chloride Stopped (01/02/18 1957)   PRN Meds: sodium chloride, acetaminophen **OR** acetaminophen, albuterol, ondansetron **OR** ondansetron (ZOFRAN) IV, oxyCODONE, polyethylene glycol, sodium chloride flush   Vital Signs    Vitals:   01/02/18 1943 01/02/18 2358 01/03/18 0403 01/03/18 1000  BP: 107/79 106/86 115/85 (!) 110/94  Pulse: 94 86 81 95  Resp: (!) 25 15 17 15   Temp: 97.9 F (36.6 C) 98.1 F (36.7 C) 97.7 F (36.5 C)   TempSrc: Oral Oral Oral   SpO2: 94% 94% 95% 98%  Weight:      Height:        Intake/Output Summary (Last 24 hours) at 01/03/2018 1140 Last data filed at 01/03/2018 1100 Gross per 24 hour  Intake 602.68 ml  Output 2050 ml  Net -1447.32 ml   Filed Weights   12/30/17 0631 12/31/17 0746 01/02/18 0500  Weight: (!) 157 kg (!) 157.6 kg (!) 155.1 kg    Telemetry    Atrial fibrillation.  Rate 90s-110s.  PVCs.  - Personally Reviewed  ECG    01/02/18: Atrial fibrillation.   Rate 110 bpm.  PVCs.  R axis deviation. - Personally Reviewed  Physical Exam   VS:  BP (!) 110/94 (BP Location: Left Arm)   Pulse 95   Temp 97.7 F (36.5 C) (Oral)   Resp 15   Ht 5\' 8"  (1.727 m)   Wt (!) 155.1 kg   SpO2 98%   BMI 51.99 kg/m  , BMI Body mass index is 51.99 kg/m. GENERAL:  Well appearing.  Morbidly obese HEENT: Pupils equal round and reactive, fundi not visualized, oral mucosa unremarkable NECK:  JVD difficult to assess. Carotid upstroke brisk and symmetric, no bruits, no thyromegaly LYMPHATICS:  No cervical adenopathy LUNGS:  Clear to auscultation bilaterally HEART:  Irregularly irregular.  PMI not displaced or sustained,S1 and S2 within normal limits, no S3, no S4, no clicks, no rubs, no murmurs ABD:  Flat, positive bowel sounds normal in frequency in pitch, no bruits, no rebound, no guarding, no midline pulsatile mass, no hepatomegaly, no splenomegaly EXT:  2 plus pulses throughout, no edema, no cyanosis no clubbing SKIN:  No rashes no nodules NEURO:  Cranial nerves II through XII grossly intact, motor grossly intact throughout PSYCH:  Cognitively intact, oriented to person  place and time   Labs    Chemistry Recent Labs  Lab 01/01/18 0159 01/02/18 0436 01/03/18 0356  NA 138 140 139  K 3.7 4.0 3.8  CL 93* 94* 94*  CO2 34* 32 34*  GLUCOSE 121* 190* 148*  BUN 54* 60* 63*  CREATININE 1.98* 1.73* 1.80*  CALCIUM 8.9 9.3 9.4  GFRNONAA 37* 43* 41*  GFRAA 43* 50* 48*  ANIONGAP 11 14 11      Hematology Recent Labs  Lab 12/29/17 0511 12/31/17 0443 01/01/18 0159  WBC 10.1 9.1 8.1  RBC 4.08* 4.15* 3.99*  HGB 12.9* 13.0 12.4*  HCT 38.4* 39.9 38.4*  MCV 94.1 96.1 96.2  MCH 31.6 31.3 31.1  MCHC 33.6 32.6 32.3  RDW 13.5 13.8 13.6  PLT 216 227 234    Cardiac EnzymesNo results for input(s): TROPONINI in the last 168 hours. No results for input(s): TROPIPOC in the last 168 hours.   BNP Recent Labs  Lab 12/27/17 2354  BNP 283.3*     DDimer No  results for input(s): DDIMER in the last 168 hours.   Radiology    No results found.  Cardiac Studies   Echo 01/01/18:  Study Conclusions  - Left ventricle: The cavity size was moderately to severely   dilated. Wall thickness was increased in a pattern of moderate   LVH. Systolic function was severely reduced. The estimated   ejection fraction was in the range of 20% to 25%. Diffuse   hypokinesis. The study is not technically sufficient to allow   evaluation of LV diastolic function. - Aortic valve: Mildly calcified annulus. Valve area (VTI): 4.07   cm^2. Valve area (Vmax): 4.14 cm^2. Valve area (Vmean): 4.16   cm^2. - Aorta: There is a single still frame view of a portion of the   proximal ascending that is dilated measuring 4.7 cm, consistent   with moderate to large aneurysm. From chart review a similar   aneurysm was detected by CT scan 03/28/2012. Limited evaluation by   echo, consider alternative modality if more complete evaluation   is indicated. - Left atrium: The atrium was severely dilated. - Right ventricle: The cavity size was mildly dilated. Systolic   function was mildly reduced. - Right atrium: The atrium was mildly to moderately dilated. - Atrial septum: No defect or patent foramen ovale was identified. - Inferior vena cava: The vessel was dilated. The respirophasic   diameter changes were blunted (< 50%), consistent with elevated   central venous pressure. - Technically difficult study. Echocontrast was used to enhance   visualization.  Patient Profile     52 y.o. male with chronic systolic and diastolic heart failure (LVEF 20 to 25%, hypertension, hyperlipidemia, diabetes, morbid obesity, OSA, pulmonary hypertension, and right heart failure here with acute on chronic heart failure, pneumonia, and acute stroke.  Assessment & Plan    # Persistent atrial fibrillation: His heart rate is better controlled after switching from carvedilol to metoprolol.  He  continues to load on amiodarone.  I think it is unlikely that he would maintain sinus rhythm off of an antiarrhythmic.  Recommend continuing to load amiodarone for a total of 5 g.  Given that he had a stroke this admission and has not been on Xarelto for long, I think there is likely persistent thrombus in his left atrial appendage.  I would recommend TEE before cardioverting him.  We will consider doing this as an outpatient given that his heart rate is now stable.  Continue  amiodarone and metoprolol.  We we will consolidate metoprolol to once daily.  # Chronic systolic and diastolic heart failure: LVEF 20-25%.  This is new in the last two months. He was switched to oral lasix 8/12.  He will need an ischemia evaluation once stable.  Body habitus makes a Lexiscan Myoview unlikely to be diagnostic.  If renal function improves, will consider LHC/RHC likely as an outpatient.  His weight is down from 166 kg to 155 kg.  Weight not yet recorded today.  He will need his advanced HF clinic appointment rescheduled at discharge.    # Hyperlipidemia:  Continue atorvastatin 80mg  daily.    # CAP: Per IM.  # Moderate ascending aorta aneurysm:  4.7cm on echo this admission.  It was 4.5cm in 2013.  Repeat in one year.  Beta blocker and BP control as above.    For questions or updates, please contact [place] Please consult [email or web address] for contact info under Cardiology/STEMI.      Signed, Skeet Latch, MD  01/03/2018, 11:40 AM

## 2018-01-03 NOTE — Progress Notes (Signed)
Inpatient Rehabilitation Admissions Coordinator  I met with patient at bedside to discuss goals and expectations of an inpt rehab admit. He is in agreement. I will begin insurance approval with [employer]. Admission pending insurance approval when pt felt medically ready.  Danne Baxter, RN, MSN Rehab Admissions Coordinator [phone] 01/03/2018 12:11 PM

## 2018-01-03 NOTE — Evaluation (Signed)
Occupational Therapy Evaluation Patient Details Name: Jay Chen MRN: [id number] DOB: 10/13/1965 Today's Date: 01/03/2018    History of Present Illness 52 y.o. male with  pmhx relevant for systolic dysfunction CHF with an EF of 20 to 25%, diabetes, hypertension, hyperlipidemia, morbid obesity/OSA/pulmonary hypertension/right-sided heart failure who presents with right-sided flank pain and bilateral LE weakness. MRI of spine revealed broad-based disc bulge at L4-L5, moderate foraminal stenosis created by synovial cyst, and muscle edema in the multifidus muscle at the level of L4-L5, likely due to muscle strain. 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   Clinical Impression   Pt admitted with the above diagnoses and presents with below problem list. Pt will benefit from continued acute OT to address the below listed deficits and maximize independence with basic ADLs prior to d/c to next venue. PTA pt was independent with ADLs, drives. Pt is currently max +2 physical assist with LB ADLs. Pt completed 4x sit<>stands with mod-max +2 A this session, sat EOB several minutes at min guard level. Bariatric stedy utilized in lieu of pivoting to recliner. Pt tolerated session well and very motivated to work with therapy. Feel pt would be excellent candidate for intensive rehab program.      Follow Up Recommendations  CIR    Equipment Recommendations  Other (comment)(defer to next venue)    Recommendations for Other Services       Precautions / Restrictions Precautions Precautions: Fall Restrictions Weight Bearing Restrictions: No      Mobility Bed Mobility Overal bed mobility: Needs Assistance Bed Mobility: Supine to Sit     Supine to sit: HOB elevated;Supervision     General bed mobility comments: supervision for line and tube management  Transfers Overall transfer level: Needs assistance Equipment used: Rolling walker (2 wheeled) Transfers: Sit  to/from [facility] Sit to Stand: Mod assist;Max assist;+2 physical assistance;From elevated surface Stand pivot transfers: Total assist       General transfer comment: sit to stand x 4 - unsteadiness when rising to feet - patient feels as if knees will buckle; use of Stedy for bed to recliner - attempted stand pivot with patient unable     Balance Overall balance assessment: Needs assistance Sitting-balance support: Single extremity supported;Bilateral upper extremity supported;Feet supported Sitting balance-Leahy Scale: Good     Standing balance support: Bilateral upper extremity supported;During functional activity Standing balance-Leahy Scale: Zero Standing balance comment: rw and +2 assist to static stand                           ADL either performed or assessed with clinical judgement   ADL Overall ADL's : Needs assistance/impaired Eating/Feeding: Set up;Sitting   Grooming: Sitting;Min guard   Upper Body Bathing: Set up;Sitting   Lower Body Bathing: Maximal assistance;+2 for physical assistance;Sit to/from stand   Upper Body Dressing : Set up;Sitting   Lower Body Dressing: Maximal assistance;+2 for physical assistance;Sit to/from stand   Toilet Transfer: Total assistance   Toileting- Clothing Manipulation and Hygiene: Total assistance   Tub/ Shower Transfer: Total assistance     General ADL Comments: Pt completed bed mobility at min guard level, sat EOB several minutes, and 4x sit<>stand with mod-max +2 assist. Utilized bariatric stedy to transfer pt EOB>recliner.      Vision Patient Visual Report: No change from baseline       Perception     Praxis      Pertinent Vitals/Pain  Pain Assessment: Faces Faces Pain Scale: Hurts little more Pain Location: back > BLE Pain Descriptors / Indicators: Sore;Aching Pain Intervention(s): Limited activity within patient's tolerance;Monitored during session;Repositioned     Hand Dominance      Extremity/Trunk Assessment Upper Extremity Assessment Upper Extremity Assessment: Overall WFL for tasks assessed   Lower Extremity Assessment Lower Extremity Assessment: Defer to PT evaluation   Cervical / Trunk Assessment Cervical / Trunk Assessment: Normal   Communication Communication Communication: No difficulties   Cognition Arousal/Alertness: Awake/alert Behavior During Therapy: WFL for tasks assessed/performed Overall Cognitive Status: Within Functional Limits for tasks assessed                                     General Comments       Exercises     Shoulder Instructions      Home Living Family/patient expects to be discharged to:: Private residence Living Arrangements: Spouse/significant other;Children Available Help at Discharge: Family;Available PRN/intermittently Type of Home: House Home Access: Stairs to enter [employer] of Steps: 3 Entrance Stairs-Rails: None Home Layout: Two level;Bed/bath upstairs;Able to live on main level with bedroom/bathroom Alternate Level Stairs-Number of Steps: 20 Alternate Level Stairs-Rails: Left Bathroom Shower/Tub: Tub/shower unit;Curtain   Bathroom Toilet: Standard Bathroom Accessibility: Yes       Additional Comments: 2 kids ages 40 and 31      Prior Functioning/Environment Level of Independence: Independent        Comments: drives        OT Problem List: Decreased strength;Decreased activity tolerance;Impaired balance (sitting and/or standing);Decreased knowledge of use of DME or AE;Decreased knowledge of precautions;Cardiopulmonary status limiting activity;Obesity;Pain      OT Treatment/Interventions: Self-care/ADL training;Therapeutic exercise;Energy conservation;Neuromuscular education;DME and/or AE instruction;Therapeutic activities;Patient/family education;Balance training    OT Goals(Current goals can be found in the care plan section) Acute Rehab OT Goals Patient Stated  Goal: decrease pain with movement OT Goal Formulation: With patient Time For Goal Achievement: 01/17/18 Potential to Achieve Goals: Good ADL Goals Pt Will Perform Grooming: with set-up;sitting Pt Will Perform Lower Body Bathing: sit to/from stand;with mod assist;with adaptive equipment Pt Will Perform Lower Body Dressing: with mod assist;sit to/from stand;with adaptive equipment Pt Will Transfer to Toilet: with mod assist;ambulating;bedside commode Pt Will Perform Toileting - Clothing Manipulation and hygiene: with mod assist;sit to/from stand;sitting/lateral leans Pt Will Perform Tub/Shower Transfer: with mod assist;ambulating;Stand pivot transfer;3 in 1;rolling walker  OT Frequency: Min 3X/week   Barriers to D/C:            Co-evaluation PT/OT/SLP Co-Evaluation/Treatment: Yes Reason for Co-Treatment: For patient/therapist safety;To address functional/ADL transfers PT goals addressed during session: Mobility/safety with mobility;Strengthening/ROM OT goals addressed during session: ADL's and self-care      AM-PAC PT "6 Clicks" Daily Activity     Outcome Measure Help from another person eating meals?: None Help from another person taking care of personal grooming?: A Little Help from another person toileting, which includes using toliet, bedpan, or urinal?: Total Help from another person bathing (including washing, rinsing, drying)?: Total Help from another person to put on and taking off regular upper body clothing?: A Little Help from another person to put on and taking off regular lower body clothing?: Total 6 Click Score: 13   End of Session Equipment Utilized During Treatment: Gait belt;Rolling walker;Other (comment);Oxygen(bariatric stedy) Nurse Communication: Mobility status;Need for lift equipment;Other (comment)(used bariatric stedy)  Activity Tolerance: Patient tolerated treatment well Patient  left: in chair;with call bell/phone within reach;with chair alarm set  OT  Visit Diagnosis: Unsteadiness on feet (R26.81);Other abnormalities of gait and mobility (R26.89);Muscle weakness (generalized) (M62.81);Pain                Time: 7395-8441 OT Time Calculation (min): 41 min Charges:  OT General Charges $OT Visit: 1 Visit OT Evaluation $OT Eval Moderate Complexity: 1 Mod    Hortencia Pilar 01/03/2018, 11:34 AM

## 2018-01-03 NOTE — Progress Notes (Signed)
Physical Therapy Treatment Patient Details Name: Jay Chen MRN: [id number] DOB: 11/02/1965 Today's Date: 01/03/2018    History of Present Illness 52 y.o. male with  pmhx relevant for systolic dysfunction CHF with an EF of 20 to 25%, diabetes, hypertension, hyperlipidemia, morbid obesity/OSA/pulmonary hypertension/right-sided heart failure who presents with right-sided flank pain and bilateral LE weakness. MRI of spine revealed broad-based disc bulge at L4-L5, moderate foraminal stenosis created by synovial cyst, and muscle edema in the multifidus muscle at the level of L4-L5, likely due to muscle strain. 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    PT Comments    Jay Chen doing well today - motivated to work with PT/OT this morning. Patient reports good improvements in pain with only soreness now in arch of L foot. Performing bed mobility at general supervision level today with good safety awareness and upright posture. Sit to stand x 4 with Mod/Max A +2 with LE weakness primary limiting factor. Attempted stand pivot to recliner, however, unable and requiring use of Stedy to complete safely and successfully. Making good progress towards goals. Will continue to follow acutely.    Follow Up Recommendations  CIR     Equipment Recommendations  (TBD)    Recommendations for Other Services Rehab consult     Precautions / Restrictions Precautions Precautions: Fall Restrictions Weight Bearing Restrictions: No    Mobility  Bed Mobility Overal bed mobility: Needs Assistance Bed Mobility: Supine to Sit     Supine to sit: Supervision;HOB elevated     General bed mobility comments: supervision for line and tube management  Transfers Overall transfer level: Needs assistance Equipment used: Rolling walker (2 wheeled) Transfers: Sit to/from [facility] Sit to Stand: Mod assist;Max assist;+2 physical assistance;From elevated  surface Stand pivot transfers: Total assist       General transfer comment: sit to stand x 4 - unsteadiness when rising to feet - patient feels as if knees will buckle; use of Stedy for bed to recliner - attempted stand pivot with patient unable   Ambulation/Gait                 Stairs             Wheelchair Mobility    Modified Rankin (Stroke Patients Only)       Balance Overall balance assessment: Needs assistance Sitting-balance support: Single extremity supported;Bilateral upper extremity supported;Feet supported Sitting balance-Leahy Scale: Good     Standing balance support: Bilateral upper extremity supported;During functional activity Standing balance-Leahy Scale: Zero                              Cognition Arousal/Alertness: Awake/alert Behavior During Therapy: WFL for tasks assessed/performed Overall Cognitive Status: Within Functional Limits for tasks assessed                                        Exercises      General Comments        Pertinent Vitals/Pain Pain Assessment: No/denies pain    Home Living                      Prior Function            PT Goals (current goals can now be found in the care plan section) Acute Rehab PT  Goals Patient Stated Goal: decrease pain with movement PT Goal Formulation: With patient Time For Goal Achievement: 01/14/18 Potential to Achieve Goals: Good Progress towards PT goals: Progressing toward goals    Frequency    Min 2X/week      PT Plan Current plan remains appropriate    Co-evaluation PT/OT/SLP Co-Evaluation/Treatment: Yes Reason for Co-Treatment: For patient/therapist safety;To address functional/ADL transfers PT goals addressed during session: Mobility/safety with mobility;Strengthening/ROM        AM-PAC PT "6 Clicks" Daily Activity  Outcome Measure  Difficulty turning over in bed (including adjusting bedclothes, sheets and  blankets)?: A Little Difficulty moving from lying on back to sitting on the side of the bed? : A Little Difficulty sitting down on and standing up from a chair with arms (e.g., wheelchair, bedside commode, etc,.)?: Unable Help needed moving to and from a bed to chair (including a wheelchair)?: Total Help needed walking in hospital room?: Total Help needed climbing 3-5 steps with a railing? : Total 6 Click Score: 10    End of Session Equipment Utilized During Treatment: Gait belt;Oxygen Activity Tolerance: Patient tolerated treatment well Patient left: in chair;with call bell/phone within reach;with chair alarm set Nurse Communication: Mobility status PT Visit Diagnosis: Pain;Muscle weakness (generalized) (M62.81);Other symptoms and signs involving the nervous system (R29.898) Pain - part of body: Leg;Knee;Hip;Ankle and joints of foot     Time: 2947-6546 PT Time Calculation (min) (ACUTE ONLY): 41 min  Charges:  $Therapeutic Activity: 23-37 mins                     Lanney Gins, PT, DPT 01/03/18 10:44 AM Pager: [phone]

## 2018-01-03 NOTE — Progress Notes (Addendum)
PROGRESS NOTE    Jay Chen  TIR:[id number] DOB: 05/20/1966 DOA: 12/27/2017 PCP: [name], MD  Brief Narrative:52 year old male with past medical history of systolic CHF with ejection fraction of 20 to 25%, diabetes, hypertension, hyperlipidemia, morbid obesity, obstructive sleep apnea, pulmonary hypertension, right-sided heart failure who presented with right-sided flank pain.  Patient also reported of some shortness of breath but no chest pain and  bilateral lower extremity edema.  CT imaging done in the emergency department did show nonobstructive renal calculi.  Patient was also found to have acute kidney injury on his CKD.  Chest x-ray showed cardiomegaly and mild vascular congestion.  Cardiology has been consulted and following.  Assessment & Plan:   Principal Problem:   Atrial fibrillation with rapid ventricular response (HCC) Active Problems:   Obstructive sleep apnea   AKI (acute kidney injury) (HCC)   Flank pain   Chronic combined systolic and diastolic heart failure (HCC)   DCM (dilated cardiomyopathy) (HCC)   PNA (pneumonia)   Cerebral embolism with cerebral infarction   Acute decompensated heart failure (HCC)   Lumbar radiculopathy   Cervical spinal cord compression (HCC)  Acute resp failure/ hypoxemia - due to PNA and CHF, possibly OHS underlying, does have hx of cor pulmonale as well. Still having hypoxemic episodes, on 5L [place] - get repeat CXR, last was 8/8 - once this has resolved , or determined that patient will need chronic O2 (possibly due to obestiy/ OHS) then patient should be medically ready for CIR  Acute CVA-small acute posterior left temporal lobe infarct, likely embolic from A. Fib. -Neurology following Dr Erlinda Hong,  appreciate recommendations, continue Xarelto - neuro signed off, pt to f/u in stroke clinic in about 4 wks  A. fib RVR -rate controlled, f/b cardiology here.  - per cardiology >>   - getting amio load po  - metoprolol worked better than  coreg for rate control  - not likely to maintain NSR off of antiarrhythmics  - likely has left atrial appendage hematoma given acute CVA this admit, therefore will likely need   TEE before cardioversion, they will consider doing this in OP setting  - cont amiodarone and metoprolol  - continue Xarelto   Acute on chronic systolic heart failure- with new ejection fraction 20 to 25%.  Initial chest x-ray with pulmonary edema, elevated BNP. On xarelto- doubt PE. likely element of OHS and OSA. - diuresed well per cardiology, no further aggressive diuresis - down 10kg in weights from admission - lasix decreased today to 40 mg po qd  LLL pneumonia - fevers resolved, no +cultures, on doxy po and IV rocephin - will switch to po abx (cefdinir) to complete 7d course - repeat CXR today given persistent hypoxemia episodes/ O2 dependence  Cervical spine cord compression and lumbar radiculopathy- bilateral lower extremity weakness, denies not have any urinary or bowel incontinence.  - MRI C-spine cervical C4-5 disc protrusion, with cord compression and cord edema - MRI L-spine L4-5 disc bulging with spinal stenosis - MRI T-spine negative - MRI Brain small acute posterior temporal lobe infarct,probable embolic.  -Neuro surgery, Dr. Salley Scarlet recs, conservative therapy for lumbar spondylosis.  Patient is symptomatic from cervical stenosis-Neurosurg will address this as outpatient, will need surgical decompression for a C5 corpectomy. -Neurology following appreciate recommendations -Decadron 10 mg x 1 given, Monitor blood sugars with DM. - PT recommends CIR.  - check hba1c. - check echo 01/01/18-EF 20 to 25%, moderately to severely dilated, diffuse hypokinesis. - .lipid  panel-LDL 75, HDL 24.  AKI on CKD stage III.  Cr- 2.7 >> 1.9.  Improved w Lasix.  Delene Loll held  - creat leveled off today 1.7- 1.8  - lasix decreased to 40 po qd per cards   OSA- not plans with CPAP at home. -  CPAP QHS  DM2 . Hgba1c-- 8.4 - Metformin and Farxiga at home held for AKI - SS1  morbid obesity obstructive sleep apnea and pulmonary hypertension patient noncompliant with CPAP encouraged to use while in the hospital.  Thoracic aortic aneurysm need outpatient follow-up.  Hypokalemia resolved.ON DAILY KDUR with lasix.  Dispo - as above, accepted by CIR when medically ready  Kelly Splinter MD [facility] pgr [phone] 10/16/2017, 9:25 AM      DVT prophylaxis: Xarelto Code Status: Full code Family Communication: No family available patient lives at home with family. Disposition Plan: dc to CIR when medically ready Consultants:  Cardiology Procedures: None Antimicrobials:  Rocephin 8/9 - 8/13 Doxy 8/9 - 8/13 Cefdinir  8/13 >   Subjective: up in chair, 5L [place], no distress. Cardiology chagned to 40 po lasix.  Afebrile now.    Objective: Vitals:   01/02/18 1943 01/02/18 2358 01/03/18 0403 01/03/18 1000  BP: 107/79 106/86 115/85 (!) 110/94  Pulse: 94 86 81 95  Resp: (!) 25 15 17 15   Temp: 97.9 F (36.6 C) 98.1 F (36.7 C) 97.7 F (36.5 C)   TempSrc: Oral Oral Oral   SpO2: 94% 94% 95% 98%  Weight:      Height:        Intake/Output Summary (Last 24 hours) at 01/03/2018 1228 Last data filed at 01/03/2018 1225 Gross per 24 hour  Intake 602.68 ml  Output 2300 ml  Net -1697.32 ml   Filed Weights   12/30/17 0631 12/31/17 0746 01/02/18 0500  Weight: (!) 157 kg (!) 157.6 kg (!) 155.1 kg    Examination: Morbidly obese gentleman in no acute distress resting in bed. Nasal O2 General exam: Appears calm and comfortable  Respiratory system: Clear to auscultation. Respiratory effort normal. Cardiovascular system: S1 & S2 heard, RRR. No JVD, murmurs, rubs, gallops or clicks. Gastrointestinal system: Abdomen obese, is nondistended, soft and nontender. No organomegaly or masses felt. Normal bowel sounds heard. Central nervous system: Alert and oriented. No focal  neurological deficits. Extremities trace pitting pedal edema bilaterally. Skin: No rashes, lesions or ulcers  Data Reviewed: I have personally reviewed following labs and imaging studies  CBC: Recent Labs  Lab 12/27/17 2354 12/29/17 0511 12/31/17 0443 01/01/18 0159  WBC 9.8 10.1 9.1 8.1  NEUTROABS  --   --  6.6 5.7  HGB 13.9 12.9* 13.0 12.4*  HCT 41.9 38.4* 39.9 38.4*  MCV 94.4 94.1 96.1 96.2  PLT 242 216 227 379   Basic Metabolic Panel: Recent Labs  Lab 12/30/17 0550 12/31/17 0443 01/01/18 0159 01/02/18 0436 01/03/18 0356  NA 137 138 138 140 139  K 3.4* 3.6 3.7 4.0 3.8  CL 90* 92* 93* 94* 94*  CO2 34* 33* 34* 32 34*  GLUCOSE 151* 139* 121* 190* 148*  BUN 60* 57* 54* 60* 63*  CREATININE 2.31* 2.18* 1.98* 1.73* 1.80*  CALCIUM 9.0 9.1 8.9 9.3 9.4   CBG: Recent Labs  Lab 01/02/18 1208 01/02/18 1643 01/02/18 2136 01/03/18 0818 01/03/18 1201  GLUCAP 173* 135* 151* 127* 163*   Lipid Profile: Recent Labs    01/01/18 0159  CHOL 116  HDL 24*  LDLCALC 75  TRIG  57  CHOLHDL 4.8    Recent Results (from the past 240 hour(s))  MRSA PCR Screening     Status: None   Collection Time: 12/31/17  5:05 AM  Result Value Ref Range Status   MRSA by PCR NEGATIVE NEGATIVE Final    Comment:        The GeneXpert MRSA Assay (FDA approved for NASAL specimens only), is one component of a comprehensive MRSA colonization surveillance program. It is not intended to diagnose MRSA infection nor to guide or monitor treatment for MRSA infections. Performed at [facility], [place] [address], [place], [place] [address]      Radiology Studies: No results found. Scheduled Meds: . amiodarone  200 mg Oral BID  . atorvastatin  80 mg Oral Daily  . cefdinir  300 mg Oral Q12H  . doxycycline  100 mg Oral Q12H  . feeding supplement (ENSURE ENLIVE)  237 mL Oral BID BM  . [START ON 01/04/2018] furosemide  40 mg Oral Daily  . insulin aspart  0-5 Units Subcutaneous QHS  . insulin  aspart  0-9 Units Subcutaneous TID WC  . mouth rinse  15 mL Mouth Rinse BID  . methocarbamol  500 mg Oral TID  . [START ON 01/04/2018] metoprolol succinate  100 mg Oral Daily  . metoprolol succinate  50 mg Oral BID  . omega-3 acid ethyl esters  2 g Oral BID  . potassium chloride SA  40 mEq Oral Daily  . rivaroxaban  20 mg Oral Q supper  . senna-docusate  2 tablet Oral QHS  . sodium chloride flush  3 mL Intravenous Q12H   Continuous Infusions: . sodium chloride Stopped (01/02/18 1957)     LOS: 6 days

## 2018-01-03 NOTE — Plan of Care (Signed)

## 2018-01-04 ENCOUNTER — Inpatient Hospital Stay (HOSPITAL_COMMUNITY)
Admission: RE | Admit: 2018-01-04 | Discharge: 2018-01-17 | DRG: 091 | Disposition: A | Discharge status: Home Health | Payer: BC Managed Care – PPO | Source: Intra-hospital | Attending: Physical Medicine & Rehabilitation | Admitting: Physical Medicine & Rehabilitation

## 2018-01-04 ENCOUNTER — Encounter (HOSPITAL_COMMUNITY): Payer: Self-pay | Admitting: *Deleted

## 2018-01-04 DIAGNOSIS — Z6841 Body Mass Index (BMI) 40.0 and over, adult: Secondary | ICD-10-CM

## 2018-01-04 DIAGNOSIS — M4712 Other spondylosis with myelopathy, cervical region: Secondary | ICD-10-CM | POA: Diagnosis not present

## 2018-01-04 DIAGNOSIS — M48061 Spinal stenosis, lumbar region without neurogenic claudication: Secondary | ICD-10-CM | POA: Diagnosis present

## 2018-01-04 DIAGNOSIS — I482 Chronic atrial fibrillation, unspecified: Secondary | ICD-10-CM

## 2018-01-04 DIAGNOSIS — Z7901 Long term (current) use of anticoagulants: Secondary | ICD-10-CM

## 2018-01-04 DIAGNOSIS — M5410 Radiculopathy, site unspecified: Secondary | ICD-10-CM | POA: Diagnosis not present

## 2018-01-04 DIAGNOSIS — E669 Obesity, unspecified: Secondary | ICD-10-CM

## 2018-01-04 DIAGNOSIS — E785 Hyperlipidemia, unspecified: Secondary | ICD-10-CM | POA: Diagnosis present

## 2018-01-04 DIAGNOSIS — R5381 Other malaise: Secondary | ICD-10-CM

## 2018-01-04 DIAGNOSIS — N183 Chronic kidney disease, stage 3 unspecified: Secondary | ICD-10-CM

## 2018-01-04 DIAGNOSIS — M4802 Spinal stenosis, cervical region: Secondary | ICD-10-CM | POA: Diagnosis present

## 2018-01-04 DIAGNOSIS — J181 Lobar pneumonia, unspecified organism: Secondary | ICD-10-CM | POA: Diagnosis present

## 2018-01-04 DIAGNOSIS — Z8249 Family history of ischemic heart disease and other diseases of the circulatory system: Secondary | ICD-10-CM | POA: Diagnosis not present

## 2018-01-04 DIAGNOSIS — E1122 Type 2 diabetes mellitus with diabetic chronic kidney disease: Secondary | ICD-10-CM | POA: Diagnosis present

## 2018-01-04 DIAGNOSIS — Z800 Family history of malignant neoplasm of digestive organs: Secondary | ICD-10-CM

## 2018-01-04 DIAGNOSIS — G4733 Obstructive sleep apnea (adult) (pediatric): Secondary | ICD-10-CM | POA: Diagnosis present

## 2018-01-04 DIAGNOSIS — I5042 Chronic combined systolic (congestive) and diastolic (congestive) heart failure: Secondary | ICD-10-CM

## 2018-01-04 DIAGNOSIS — R2689 Other abnormalities of gait and mobility: Principal | ICD-10-CM | POA: Diagnosis present

## 2018-01-04 DIAGNOSIS — E1169 Type 2 diabetes mellitus with other specified complication: Secondary | ICD-10-CM

## 2018-01-04 DIAGNOSIS — I6389 Other cerebral infarction: Secondary | ICD-10-CM

## 2018-01-04 DIAGNOSIS — M4726 Other spondylosis with radiculopathy, lumbar region: Secondary | ICD-10-CM | POA: Diagnosis present

## 2018-01-04 DIAGNOSIS — K5900 Constipation, unspecified: Secondary | ICD-10-CM | POA: Diagnosis present

## 2018-01-04 DIAGNOSIS — I10 Essential (primary) hypertension: Secondary | ICD-10-CM

## 2018-01-04 DIAGNOSIS — Z79899 Other long term (current) drug therapy: Secondary | ICD-10-CM

## 2018-01-04 DIAGNOSIS — G9529 Other cord compression: Secondary | ICD-10-CM | POA: Diagnosis present

## 2018-01-04 DIAGNOSIS — E1142 Type 2 diabetes mellitus with diabetic polyneuropathy: Secondary | ICD-10-CM | POA: Diagnosis present

## 2018-01-04 DIAGNOSIS — M109 Gout, unspecified: Secondary | ICD-10-CM | POA: Diagnosis present

## 2018-01-04 DIAGNOSIS — I4819 Other persistent atrial fibrillation: Secondary | ICD-10-CM

## 2018-01-04 DIAGNOSIS — I130 Hypertensive heart and chronic kidney disease with heart failure and stage 1 through stage 4 chronic kidney disease, or unspecified chronic kidney disease: Secondary | ICD-10-CM | POA: Diagnosis present

## 2018-01-04 DIAGNOSIS — I2781 Cor pulmonale (chronic): Secondary | ICD-10-CM | POA: Diagnosis present

## 2018-01-04 DIAGNOSIS — M5415 Radiculopathy, thoracolumbar region: Secondary | ICD-10-CM | POA: Diagnosis not present

## 2018-01-04 DIAGNOSIS — I481 Persistent atrial fibrillation: Secondary | ICD-10-CM | POA: Diagnosis present

## 2018-01-04 DIAGNOSIS — E1140 Type 2 diabetes mellitus with diabetic neuropathy, unspecified: Secondary | ICD-10-CM

## 2018-01-04 DIAGNOSIS — Z7984 Long term (current) use of oral hypoglycemic drugs: Secondary | ICD-10-CM

## 2018-01-04 DIAGNOSIS — E876 Hypokalemia: Secondary | ICD-10-CM

## 2018-01-04 DIAGNOSIS — Z8042 Family history of malignant neoplasm of prostate: Secondary | ICD-10-CM | POA: Diagnosis not present

## 2018-01-04 DIAGNOSIS — M5416 Radiculopathy, lumbar region: Secondary | ICD-10-CM | POA: Diagnosis not present

## 2018-01-04 DIAGNOSIS — E119 Type 2 diabetes mellitus without complications: Secondary | ICD-10-CM

## 2018-01-04 DIAGNOSIS — E782 Mixed hyperlipidemia: Secondary | ICD-10-CM

## 2018-01-04 DIAGNOSIS — J189 Pneumonia, unspecified organism: Secondary | ICD-10-CM

## 2018-01-04 DIAGNOSIS — I5032 Chronic diastolic (congestive) heart failure: Secondary | ICD-10-CM

## 2018-01-04 LAB — BASIC METABOLIC PANEL
ANION GAP: 11 (ref 5–15)
BUN: 67 mg/dL — AB (ref 6–20)
CALCIUM: 9.3 mg/dL (ref 8.9–10.3)
CO2: 33 mmol/L — ABNORMAL HIGH (ref 22–32)
Chloride: 96 mmol/L — ABNORMAL LOW (ref 98–111)
Creatinine, Ser: 1.85 mg/dL — ABNORMAL HIGH (ref 0.61–1.24)
GFR calc Af Amer: 46 mL/min — ABNORMAL LOW (ref >60)
GFR, EST NON AFRICAN AMERICAN: 40 mL/min — AB (ref >60)
GLUCOSE: 130 mg/dL — AB (ref 70–99)
Potassium: 4 mmol/L (ref 3.5–5.1)
Sodium: 140 mmol/L (ref 135–145)

## 2018-01-04 LAB — GLUCOSE, CAPILLARY
GLUCOSE-CAPILLARY: 114 mg/dL — AB (ref 70–99)
GLUCOSE-CAPILLARY: 130 mg/dL — AB (ref 70–99)
GLUCOSE-CAPILLARY: 156 mg/dL — AB (ref 70–99)
Glucose-Capillary: 129 mg/dL — ABNORMAL HIGH (ref 70–99)

## 2018-01-04 MED ORDER — ATORVASTATIN CALCIUM 80 MG PO TABS: 80.0000 mg | ORAL_TABLET | Freq: Every day | ORAL | Status: DC

## 2018-01-04 MED ORDER — ACETAMINOPHEN 650 MG RE SUPP: 650.0000 mg | Freq: Four times a day (QID) | RECTAL | Status: DC | PRN

## 2018-01-04 MED ORDER — DOXYCYCLINE HYCLATE 100 MG PO TABS: 100.0000 mg | ORAL_TABLET | Freq: Two times a day (BID) | ORAL | Status: DC

## 2018-01-04 MED ORDER — INSULIN ASPART 100 UNIT/ML ~~LOC~~ SOLN: 0.0000 [IU] | Freq: Every day | SUBCUTANEOUS | 11 refills | Status: DC

## 2018-01-04 MED ORDER — ONDANSETRON HCL 4 MG PO TABS: 4.0000 mg | ORAL_TABLET | Freq: Four times a day (QID) | ORAL | Status: DC | PRN

## 2018-01-04 MED ORDER — ACETAMINOPHEN 325 MG PO TABS
650.0000 mg | ORAL_TABLET | Freq: Four times a day (QID) | ORAL | Status: DC | PRN
  Administered 2018-01-04: 650 mg via ORAL
  Filled 2018-01-04: qty 2

## 2018-01-04 MED ORDER — CEFDINIR 300 MG PO CAPS: 300.0000 mg | ORAL_CAPSULE | Freq: Two times a day (BID) | ORAL | Status: DC

## 2018-01-04 MED ORDER — ATORVASTATIN CALCIUM 80 MG PO TABS
80.0000 mg | ORAL_TABLET | Freq: Every day | ORAL | Status: DC
  Administered 2018-01-05 – 2018-01-17 (×13): 80 mg via ORAL
  Filled 2018-01-04 (×13): qty 1

## 2018-01-04 MED ORDER — SORBITOL 70 % SOLN
30.0000 mL | Freq: Every day | Status: DC | PRN
  Administered 2018-01-04 – 2018-01-14 (×3): 30 mL via ORAL
  Filled 2018-01-04 (×3): qty 30

## 2018-01-04 MED ORDER — SENNOSIDES-DOCUSATE SODIUM 8.6-50 MG PO TABS: 2.0000 | ORAL_TABLET | Freq: Every day | ORAL | Status: DC

## 2018-01-04 MED ORDER — ALBUTEROL SULFATE (2.5 MG/3ML) 0.083% IN NEBU: 2.5000 mg | INHALATION_SOLUTION | RESPIRATORY_TRACT | Status: DC | PRN

## 2018-01-04 MED ORDER — AMIODARONE HCL 200 MG PO TABS
200.0000 mg | ORAL_TABLET | Freq: Two times a day (BID) | ORAL | Status: DC
  Administered 2018-01-04 – 2018-01-16 (×24): 200 mg via ORAL
  Filled 2018-01-04 (×24): qty 1

## 2018-01-04 MED ORDER — POLYETHYLENE GLYCOL 3350 17 G PO PACK: 17.0000 g | PACK | Freq: Every day | ORAL | 0 refills | Status: DC | PRN

## 2018-01-04 MED ORDER — INSULIN ASPART 100 UNIT/ML ~~LOC~~ SOLN
0.0000 [IU] | Freq: Every day | SUBCUTANEOUS | Status: DC
Start: 2018-01-04 — End: 2018-01-17

## 2018-01-04 MED ORDER — ACETAMINOPHEN 325 MG PO TABS: 650.0000 mg | ORAL_TABLET | Freq: Four times a day (QID) | ORAL | Status: DC | PRN

## 2018-01-04 MED ORDER — ENSURE ENLIVE PO LIQD
237.0000 mL | Freq: Two times a day (BID) | ORAL | Status: DC
Start: 2018-01-05 — End: 2018-01-17
  Administered 2018-01-06 – 2018-01-16 (×13): 237 mL via ORAL

## 2018-01-04 MED ORDER — ONDANSETRON HCL 4 MG/2ML IJ SOLN: 4.0000 mg | Freq: Four times a day (QID) | INTRAMUSCULAR | Status: DC | PRN

## 2018-01-04 MED ORDER — METOPROLOL SUCCINATE ER 50 MG PO TB24
100.0000 mg | ORAL_TABLET | Freq: Every day | ORAL | Status: DC
  Administered 2018-01-05 – 2018-01-06 (×2): 100 mg via ORAL
  Administered 2018-01-07: 50 mg via ORAL
  Administered 2018-01-08 – 2018-01-17 (×10): 100 mg via ORAL
  Filled 2018-01-04 (×13): qty 2

## 2018-01-04 MED ORDER — METOPROLOL SUCCINATE ER 100 MG PO TB24: 100.0000 mg | ORAL_TABLET | Freq: Every day | ORAL | Status: DC

## 2018-01-04 MED ORDER — AMIODARONE HCL 200 MG PO TABS: ORAL_TABLET | ORAL | Status: DC

## 2018-01-04 MED ORDER — ALBUTEROL SULFATE (2.5 MG/3ML) 0.083% IN NEBU: 2.5000 mg | INHALATION_SOLUTION | RESPIRATORY_TRACT | 12 refills | Status: DC | PRN

## 2018-01-04 MED ORDER — DOXYCYCLINE HYCLATE 100 MG PO TABS
100.0000 mg | ORAL_TABLET | Freq: Two times a day (BID) | ORAL | Status: AC
  Administered 2018-01-04 – 2018-01-06 (×5): 100 mg via ORAL
  Filled 2018-01-04 (×5): qty 1

## 2018-01-04 MED ORDER — POTASSIUM CHLORIDE CRYS ER 20 MEQ PO TBCR
40.0000 meq | EXTENDED_RELEASE_TABLET | Freq: Every day | ORAL | Status: DC
  Administered 2018-01-05 – 2018-01-17 (×13): 40 meq via ORAL
  Filled 2018-01-04 (×13): qty 2

## 2018-01-04 MED ORDER — FUROSEMIDE 40 MG PO TABS
40.0000 mg | ORAL_TABLET | Freq: Every day | ORAL | Status: DC
  Administered 2018-01-05 – 2018-01-17 (×13): 40 mg via ORAL
  Filled 2018-01-04 (×13): qty 1

## 2018-01-04 MED ORDER — ENSURE ENLIVE PO LIQD: 237.0000 mL | Freq: Two times a day (BID) | ORAL | 12 refills | Status: DC

## 2018-01-04 MED ORDER — SENNOSIDES-DOCUSATE SODIUM 8.6-50 MG PO TABS
2.0000 | ORAL_TABLET | Freq: Every day | ORAL | Status: DC
  Administered 2018-01-04 – 2018-01-15 (×12): 2 via ORAL
  Filled 2018-01-04 (×13): qty 2

## 2018-01-04 MED ORDER — OXYCODONE HCL 10 MG PO TABS: 5.0000 mg | ORAL_TABLET | Freq: Four times a day (QID) | ORAL | 0 refills | Status: DC | PRN

## 2018-01-04 MED ORDER — INSULIN ASPART 100 UNIT/ML ~~LOC~~ SOLN: 0.0000 [IU] | Freq: Three times a day (TID) | SUBCUTANEOUS | 11 refills | Status: DC

## 2018-01-04 MED ORDER — OMEGA-3-ACID ETHYL ESTERS 1 G PO CAPS
2.0000 g | ORAL_CAPSULE | Freq: Two times a day (BID) | ORAL | Status: DC
  Administered 2018-01-04 – 2018-01-17 (×26): 2 g via ORAL
  Filled 2018-01-04 (×26): qty 2

## 2018-01-04 MED ORDER — CEFDINIR 300 MG PO CAPS
300.0000 mg | ORAL_CAPSULE | Freq: Two times a day (BID) | ORAL | Status: AC
  Administered 2018-01-04 – 2018-01-06 (×5): 300 mg via ORAL
  Filled 2018-01-04 (×5): qty 1

## 2018-01-04 MED ORDER — COLCHICINE 0.6 MG PO TABS
0.6000 mg | ORAL_TABLET | ORAL | Status: AC
  Administered 2018-01-04: 0.6 mg via ORAL
  Filled 2018-01-04: qty 1

## 2018-01-04 MED ORDER — OXYCODONE HCL 5 MG PO TABS
10.0000 mg | ORAL_TABLET | ORAL | Status: DC | PRN
  Administered 2018-01-04 – 2018-01-14 (×16): 10 mg via ORAL
  Filled 2018-01-04 (×16): qty 2

## 2018-01-04 MED ORDER — RIVAROXABAN 20 MG PO TABS
20.0000 mg | ORAL_TABLET | Freq: Every day | ORAL | Status: DC
  Administered 2018-01-04 – 2018-01-16 (×13): 20 mg via ORAL
  Filled 2018-01-04 (×13): qty 1

## 2018-01-04 MED ORDER — POLYETHYLENE GLYCOL 3350 17 G PO PACK
17.0000 g | PACK | Freq: Every day | ORAL | Status: DC | PRN
  Administered 2018-01-12 – 2018-01-15 (×2): 17 g via ORAL
  Filled 2018-01-04 (×3): qty 1

## 2018-01-04 MED ORDER — METHOCARBAMOL 500 MG PO TABS
500.0000 mg | ORAL_TABLET | Freq: Three times a day (TID) | ORAL | Status: DC
  Administered 2018-01-04 – 2018-01-17 (×38): 500 mg via ORAL
  Filled 2018-01-04 (×38): qty 1

## 2018-01-04 NOTE — Progress Notes (Signed)
Progress Note  Patient Name: Jay Chen Date of Encounter: 01/04/2018  Primary Cardiologist: Quay Burow, MD   Subjective   Breathing is getting better.  No chest pain.  He feels weak when walking.  Inpatient Medications    Scheduled Meds: . amiodarone  200 mg Oral BID  . atorvastatin  80 mg Oral Daily  . cefdinir  300 mg Oral Q12H  . doxycycline  100 mg Oral Q12H  . feeding supplement (ENSURE ENLIVE)  237 mL Oral BID BM  . furosemide  40 mg Oral Daily  . insulin aspart  0-5 Units Subcutaneous QHS  . insulin aspart  0-9 Units Subcutaneous TID WC  . mouth rinse  15 mL Mouth Rinse BID  . methocarbamol  500 mg Oral TID  . metoprolol succinate  100 mg Oral Daily  . omega-3 acid ethyl esters  2 g Oral BID  . potassium chloride SA  40 mEq Oral Daily  . rivaroxaban  20 mg Oral Q supper  . senna-docusate  2 tablet Oral QHS  . sodium chloride flush  3 mL Intravenous Q12H   Continuous Infusions: . sodium chloride Stopped (01/02/18 1957)   PRN Meds: sodium chloride, acetaminophen **OR** acetaminophen, albuterol, ondansetron **OR** ondansetron (ZOFRAN) IV, oxyCODONE, polyethylene glycol, sodium chloride flush   Vital Signs    Vitals:   01/03/18 0403 01/03/18 1000 01/03/18 2125 01/04/18 0517  BP: 115/85 (!) 110/94 117/82 (!) 123/97  Pulse: 81 95 81 95  Resp: 17 15 (!) 24 (!) 24  Temp: 97.7 F (36.5 C)  98 F (36.7 C) 97.8 F (36.6 C)  TempSrc: Oral  Oral Oral  SpO2: 95% 98% 96% 95%  Weight:    (!) 155.7 kg  Height:        Intake/Output Summary (Last 24 hours) at 01/04/2018 0958 Last data filed at 01/04/2018 0813 Gross per 24 hour  Intake 586 ml  Output 1215 ml  Net -629 ml   Filed Weights   12/31/17 0746 01/02/18 0500 01/04/18 0517  Weight: (!) 157.6 kg (!) 155.1 kg (!) 155.7 kg    Telemetry    No longer on telemetry  - Personally Reviewed  ECG    01/02/18: Atrial fibrillation.  Rate 110 bpm.  PVCs.  R axis deviation. - Personally  Reviewed  Physical Exam   VS:  BP (!) 123/97 (BP Location: Left Arm)   Pulse 95   Temp 97.8 F (36.6 C) (Oral)   Resp (!) 24   Ht 5\' 8"  (1.727 m)   Wt (!) 155.7 kg   SpO2 95%   BMI 52.19 kg/m  , BMI Body mass index is 52.19 kg/m. GENERAL:  Well appearing.  Morbidly obese HEENT: Pupils equal round and reactive, fundi not visualized, oral mucosa unremarkable NECK:  JVD difficult to assess. Carotid upstroke brisk and symmetric, no bruits, no thyromegaly LYMPHATICS:  No cervical adenopathy LUNGS:  Clear to auscultation bilaterally HEART:  Irregularly irregular.  PMI not displaced or sustained,S1 and S2 within normal limits, no S3, no S4, no clicks, no rubs, no murmurs ABD:  Flat, positive bowel sounds normal in frequency in pitch, no bruits, no rebound, no guarding, no midline pulsatile mass, no hepatomegaly, no splenomegaly EXT:  2 plus pulses throughout, no edema, no cyanosis no clubbing SKIN:  No rashes no nodules NEURO:  Cranial nerves II through XII grossly intact, motor grossly intact throughout PSYCH:  Cognitively intact, oriented to person place and time   Labs  Chemistry Recent Labs  Lab 01/02/18 0436 01/03/18 0356 01/04/18 0420  NA 140 139 140  K 4.0 3.8 4.0  CL 94* 94* 96*  CO2 32 34* 33*  GLUCOSE 190* 148* 130*  BUN 60* 63* 67*  CREATININE 1.73* 1.80* 1.85*  CALCIUM 9.3 9.4 9.3  GFRNONAA 43* 41* 40*  GFRAA 50* 48* 46*  ANIONGAP 14 11 11      Hematology Recent Labs  Lab 12/29/17 0511 12/31/17 0443 01/01/18 0159  WBC 10.1 9.1 8.1  RBC 4.08* 4.15* 3.99*  HGB 12.9* 13.0 12.4*  HCT 38.4* 39.9 38.4*  MCV 94.1 96.1 96.2  MCH 31.6 31.3 31.1  MCHC 33.6 32.6 32.3  RDW 13.5 13.8 13.6  PLT 216 227 234    Cardiac EnzymesNo results for input(s): TROPONINI in the last 168 hours. No results for input(s): TROPIPOC in the last 168 hours.   BNP No results for input(s): BNP, PROBNP in the last 168 hours.   DDimer No results for input(s): DDIMER in the last  168 hours.   Radiology    Dg Chest 2 View  Result Date: 01/03/2018 CLINICAL DATA:  Follow-up LEFT LOWER LOBE atelectasis and/or pneumonia and mild CHF. EXAM: CHEST - 2 VIEW COMPARISON:  12/29/2017, 12/28/2017 and CT chest 03/28/2012 and earlier. FINDINGS: AP SEMI-ERECT and LATERAL images were obtained. Cardiac silhouette markedly enlarged, increased in size since 2013. Hilar and mediastinal contours otherwise unremarkable. Interval resolution of the interstitial pulmonary edema since the examination 5 days ago. Improved aeration in the LEFT LOWER LOBE without significant residual airspace consolidation. No new pulmonary parenchymal abnormalities. No pleural effusions. Degenerative changes involving the thoracic and UPPER lumbar spine. IMPRESSION: 1. Marked cardiomegaly.  No acute cardiopulmonary disease currently. 2. Interval resolution of interstitial pulmonary edema and LEFT LOWER LOBE atelectasis and/or pneumonia since the examination 5 days ago. Electronically Signed   By: Evangeline Dakin M.D.   On: 01/03/2018 13:08    Cardiac Studies   Echo 01/01/18:  Study Conclusions  - Left ventricle: The cavity size was moderately to severely   dilated. Wall thickness was increased in a pattern of moderate   LVH. Systolic function was severely reduced. The estimated   ejection fraction was in the range of 20% to 25%. Diffuse   hypokinesis. The study is not technically sufficient to allow   evaluation of LV diastolic function. - Aortic valve: Mildly calcified annulus. Valve area (VTI): 4.07   cm^2. Valve area (Vmax): 4.14 cm^2. Valve area (Vmean): 4.16   cm^2. - Aorta: There is a single still frame view of a portion of the   proximal ascending that is dilated measuring 4.7 cm, consistent   with moderate to large aneurysm. From chart review a similar   aneurysm was detected by CT scan 03/28/2012. Limited evaluation by   echo, consider alternative modality if more complete evaluation   is  indicated. - Left atrium: The atrium was severely dilated. - Right ventricle: The cavity size was mildly dilated. Systolic   function was mildly reduced. - Right atrium: The atrium was mildly to moderately dilated. - Atrial septum: No defect or patent foramen ovale was identified. - Inferior vena cava: The vessel was dilated. The respirophasic   diameter changes were blunted (< 50%), consistent with elevated   central venous pressure. - Technically difficult study. Echocontrast was used to enhance   visualization.  Patient Profile     52 y.o. male with chronic systolic and diastolic heart failure (LVEF 20 to 25%, hypertension, hyperlipidemia,  diabetes, morbid obesity, OSA, pulmonary hypertension, and right heart failure here with acute on chronic heart failure, pneumonia, and acute stroke.  Assessment & Plan    # Persistent atrial fibrillation: His heart rate is better controlled after switching from carvedilol to metoprolol.  He continues to load on amiodarone.  I think it is unlikely that he would maintain sinus rhythm off of an antiarrhythmic.  Recommend continuing to load amiodarone for a total of 5 g.  He will continue 200mg  bid through 8/19 then 200mg  daily.  Given that he had a stroke this admission and has not been on Xarelto for long, I think there is likely persistent thrombus in his left atrial appendage.  I would recommend TEE before cardioverting him.  We will consider doing this as an outpatient given that his heart rate is now stable.  Continue amiodarone and metoprolol.    # Chronic systolic and diastolic heart failure: LVEF 20-25%.  This is new in the last two months. He was switched to oral lasix 8/12.  He was net -2.3L yesterday despite not receiving any diuretic.  Started lasix 40mg  daily today.  Will need to watch renal function closely and possibly reduce the dose as he appears euvolemic.  He will need an ischemia evaluation once stable.  Body habitus makes a Lexiscan  Myoview unlikely to be diagnostic.  If renal function improves, will consider LHC/RHC likely as an outpatient.  His weight is down from 166 kg to 155 kg.  He will need his advanced HF clinic appointment rescheduled at discharge.    # Hyperlipidemia:  Continue atorvastatin 80mg  daily.    # CAP: Per IM.  # Moderate ascending aorta aneurysm:  4.7cm on echo this admission.  It was 4.5cm in 2013.  Repeat in one year.  Beta blocker and BP control as above.   Stable for discharge from a Cardiology standpoint.    [facility] will sign off.   Medication Recommendations:  Lasix 40mg  po daily Other recommendations (labs, testing, etc):  Daily BMP until discharge then BMP in 1 week. Follow up as an outpatient:  We will arrange outpatient follow up.    For questions or updates, please contact [place] Please consult [email or web address] for contact info under Cardiology/STEMI.      Signed, Skeet Latch, MD  01/04/2018, 9:58 AM

## 2018-01-04 NOTE — Progress Notes (Addendum)
Inpatient Rehabilitation Admissions Coordinator  I have insurance approval and bed available to admit pt to inpt rehab today. Pt is in agreement. I have contacted Dr. Waldron Labs via [facility] to confirm d/c today. I await his call back to arrange admission to CIR today. [place] CM, Levada Dy, is aware.  Danne Baxter, RN, MSN Rehab Admissions Coordinator [phone] 01/04/2018 10:28 AM   Dr. Waldron Labs has confirmed d/c. I will arrange.

## 2018-01-04 NOTE — PMR Pre-admission (Addendum)
PMR Admission Coordinator Pre-Admission Assessment  Patient: Jay Chen is an 52 y.o., male MRN: [id number] DOB: Jan 22, 1966 Height: 5\' 8"  (172.7 cm) Weight: (!) 155.7 kg              Insurance Information HMO:     PPO: yes     PCP:      IPA:      80/20:      OTHER:  PRIMARY: [place] [facility] [place]      Policy#: [id number]      Subscriber: wife CM Name: Philis Fendt      Phone#: [phone]     Fax#: [phone] Pre-Cert#: [id number] approved until 8/28 when updates are due      Employer: [employer] Benefits:  Phone #: [phone]     Name: 01/03/18 Eff. Date: 05/24/2017     Deduct: $1080      Out of Pocket Max: $5468     Life Max: none CIR: $337 co pay per admit then insurance covers 70%      SNF: 70%  100 days Outpatient: $72 co pay per visit     Co-Pay: visits per medical neccesity Home Health: 70%      Co-Pay: 30% visits per medical neccesity DME: 70%     Co-Pay: 30% Providers: in network  SECONDARY: none       Medicaid Application Date:       Case Manager:  Disability Application Date:       Case Worker:   Emergency Facilities manager Information    Name Relation Home Work Mobile   [place] Spouse [phone]  [phone]   [place], [place] Mother   [phone]     Current Medical History  Patient Admitting Diagnosis: cervical stenosis with cord compression, lumbar spondylosis with radiculopathy, left temporal lobe infarct  History of Present Illness: HPI: Jay Chen is a 52 year old right-handed male with history of diastolic/systolic congestive heart failure, CKD stage III, history of thoracic aneurysm, diabetes mellitus, hypertension, hyperlipidemia, morbid obesity/OSA.  Presented 12/28/2017 with right side flank pain and bilateral lower extremity weakness.  Chest x-ray showed mild vascular congestion and borderline cardiomegaly as well as suspected opacity at the left lung base could not exclude pneumonia and was placed on Rocephin  as well as doxycycline.  CT renal stone study no acute abnormality seen.  There was a 2 mm non obstructing stone at the lower pole of the right kidney.  Ultrasound aorta without abdominal aortic aneurysm noted.  MRI of the brain showed small acute posterior temporal lobe infarction.  MRI lumbar thoracic cervical spine showed L4-5 broad-based disc bulge.  Moderate bilateral facet arthropathy with facet effusions.  L5-S1 with moderate right facet arthropathy.  MRI cervical spine with C4-5 severe cord compression due to disc protrusion with cord edema.  Echocardiogram with ejection fraction 25%.  Systolic function severely reduced.  Carotid Dopplers with no ICA stenosis.  Close monitoring of renal function 2.31-2.72.  Cardiology services consulted in regards to CHF as well as persistent atrial fibrillation remained on Coreg as well as Lasix and Lopressor.  Xarelto was added for atrial fibrillation with cardiac rate controlled.  There was question of left lower lobe pneumonia per chest x-ray with follow-up chest x-ray completed after broad-spectrum antibiotics currently completing course of Omnicef and doxycycline.  Neurology consulted and follow-up initially maintained on prednisone Dosepak for severe spinal cord compression with lumbar radiculopathy and no plan for surgical intervention at this time per Dr. Kathyrn Sheriff and plan to  address as outpatient.    Past Medical History  Past Medical History:  Diagnosis Date  . CKD (chronic kidney disease), stage III ([place])   . Congestive heart failure [facility]) May of 2011   Felt to have cor pulmonale; EF 45 to 50% from echo in May 2011  . Cor pulmonale (chronic) ([place])   . Gout    "take daily RX" (12/28/2017)  . Hyperlipidemia   . Hypertension   . Morbid obesity ([place])   . Persistent atrial fibrillation ([place])    [name] 12/28/2017  . Sleep apnea    "dx'd; couldn't tolerate CPAP" (12/28/2017)  . Thoracic aneurysm    a. 4.8cm thoracic aortic aneurysm by CT 2013.  [name] Thoracic  aortic aneurysm ([place])    known/notes 12/28/2017  . Type II diabetes mellitus ([facility])     Family History  family history includes Colon cancer in his father; Hypertension in his mother; Pancreatic cancer in his father; Prostate cancer in his father.  Prior Rehab/Hospitalizations:  Has the patient had major surgery during 100 days prior to admission? No  Current Medications   Current Facility-Administered Medications:  .  0.9 %  sodium chloride infusion, 250 mL, Intravenous, PRN, Roxan Hockey, MD, Stopped at 01/02/18 1957 .  acetaminophen (TYLENOL) tablet 650 mg, 650 mg, Oral, Q6H PRN, 650 mg at 12/30/17 1514 **OR** acetaminophen (TYLENOL) suppository 650 mg, 650 mg, Rectal, Q6H PRN, Emokpae, Courage, MD .  albuterol (PROVENTIL) (2.5 MG/3ML) 0.083% nebulizer solution 2.5 mg, 2.5 mg, Nebulization, Q2H PRN, Emokpae, Courage, MD .  amiodarone (PACERONE) tablet 200 mg, 200 mg, Oral, BID, Turner, Traci R, MD, 200 mg at 01/04/18 0811 .  atorvastatin (LIPITOR) tablet 80 mg, 80 mg, Oral, Daily, Rosalin Hawking, MD, 80 mg at 01/04/18 5277 .  cefdinir (OMNICEF) capsule 300 mg, 300 mg, Oral, Q12H, Roney Jaffe, MD, 300 mg at 01/04/18 0439 .  doxycycline (VIBRA-TABS) tablet 100 mg, 100 mg, Oral, Q12H, Roney Jaffe, MD, 100 mg at 01/04/18 0812 .  feeding supplement (ENSURE ENLIVE) (ENSURE ENLIVE) liquid 237 mL, 237 mL, Oral, BID BM, Emokpae, Courage, MD, 237 mL at 01/04/18 0812 .  furosemide (LASIX) tablet 40 mg, 40 mg, Oral, Daily, Skeet Latch, MD, 40 mg at 01/04/18 0813 .  insulin aspart (novoLOG) injection 0-5 Units, 0-5 Units, Subcutaneous, QHS, Emokpae, Courage, MD .  insulin aspart (novoLOG) injection 0-9 Units, 0-9 Units, Subcutaneous, TID WC, Emokpae, Courage, MD, 1 Units at 01/04/18 0811 .  MEDLINE mouth rinse, 15 mL, Mouth Rinse, BID, Roney Jaffe, MD, 15 mL at 01/04/18 1110 .  methocarbamol (ROBAXIN) tablet 500 mg, 500 mg, Oral, TID, Shelly Coss, MD, 500 mg at 01/04/18 0812 .   metoprolol succinate (TOPROL-XL) 24 hr tablet 100 mg, 100 mg, Oral, Daily, Skeet Latch, MD, 100 mg at 01/04/18 0811 .  omega-3 acid ethyl esters (LOVAZA) capsule 2 g, 2 g, Oral, BID, Emokpae, Courage, MD, 2 g at 01/04/18 0811 .  ondansetron (ZOFRAN) tablet 4 mg, 4 mg, Oral, Q6H PRN **OR** ondansetron (ZOFRAN) injection 4 mg, 4 mg, Intravenous, Q6H PRN, Emokpae, Courage, MD .  oxyCODONE (Oxy IR/ROXICODONE) immediate release tablet 10 mg, 10 mg, Oral, Q4H PRN, Shelly Coss, MD, 10 mg at 01/04/18 0811 .  polyethylene glycol (MIRALAX / GLYCOLAX) packet 17 g, 17 g, Oral, Daily PRN, Emokpae, Courage, MD .  potassium chloride SA (K-DUR,KLOR-CON) CR tablet 40 mEq, 40 mEq, Oral, Daily, Emokpae, Courage, MD, 40 mEq at 01/04/18 0812 .  rivaroxaban (XARELTO) tablet 20 mg, 20 mg, Oral,  Q supper, Alvira Philips, [place], 20 mg at 01/03/18 1707 .  senna-docusate (Senokot-S) tablet 2 tablet, 2 tablet, Oral, QHS, Emokpae, Courage, MD, 2 tablet at 01/03/18 2218 .  sodium chloride flush (NS) 0.9 % injection 3 mL, 3 mL, Intravenous, Q12H, Emokpae, Courage, MD, 3 mL at 01/04/18 0813 .  sodium chloride flush (NS) 0.9 % injection 3 mL, 3 mL, Intravenous, PRN, Denton Brick, Courage, MD  Patients Current Diet:  Diet Order            Diet heart healthy/carb modified Room service appropriate? Yes; Fluid consistency: Thin  Diet effective now              Precautions / Restrictions Precautions Precautions: Fall Restrictions Weight Bearing Restrictions: No   Has the patient had 2 or more falls or a fall with injury in the past year?No  Prior Activity Level Community (5-7x/wk): Independent and driving pta; retired  Development worker, international aid / Paramedic Devices/Equipment: None  Prior Device Use: Indicate devices/aids used by the patient prior to current illness, exacerbation or injury? None of the above  Prior Functional Level Prior Function Level of Independence: Independent Comments:  drives  Self Care: Did the patient need help bathing, dressing, using the toilet or eating?  Independent  Indoor Mobility: Did the patient need assistance with walking from room to room (with or without device)? Independent  Stairs: Did the patient need assistance with internal or external stairs (with or without device)? Independent  Functional Cognition: Did the patient need help planning regular tasks such as shopping or remembering to take medications? Independent  Current Functional Level Cognition  Overall Cognitive Status: Within Functional Limits for tasks assessed Orientation Level: Oriented X4 General Comments: unaware that he was soaked in urine    Extremity Assessment (includes Sensation/Coordination)  Upper Extremity Assessment: Overall WFL for tasks assessed  Lower Extremity Assessment: Defer to PT evaluation RLE Deficits / Details: limited P/AROM due to pain, hip ab/ad, knee ext, and plantar flexion with less pain than hip/knee flex and ankle dorsiflexion  RLE: Unable to fully assess due to pain RLE Sensation: WNL RLE Coordination: decreased fine motor, decreased gross motor LLE Deficits / Details: limited P/AROM due to pain, hip ab/ad, knee ext, and plantar flexion with less pain than hip/knee flex and ankle dorsiflexion  LLE: Unable to fully assess due to pain LLE Sensation: WNL LLE Coordination: decreased fine motor, decreased gross motor    ADLs  Overall ADL's : Needs assistance/impaired Eating/Feeding: Set up, Sitting Grooming: Sitting, Min guard Upper Body Bathing: Set up, Sitting Lower Body Bathing: Maximal assistance, +2 for physical assistance, Sit to/from stand Upper Body Dressing : Set up, Sitting Lower Body Dressing: Maximal assistance, +2 for physical assistance, Sit to/from stand Toilet Transfer: Total assistance Toileting- Clothing Manipulation and Hygiene: Total assistance Tub/ Shower Transfer: Total assistance General ADL Comments: Pt completed  bed mobility at min guard level, sat EOB several minutes, and 4x sit<>stand with mod-max +2 assist. Utilized bariatric stedy to transfer pt EOB>recliner.     Mobility  Overal bed mobility: Needs Assistance Bed Mobility: Supine to Sit Rolling: Max assist, +2 for physical assistance Supine to sit: HOB elevated, Supervision General bed mobility comments: supervision for line and tube management    Transfers  Overall transfer level: Needs assistance Equipment used: Rolling walker (2 wheeled) Transfer via Lift Equipment: Stedy Transfers: Sit to/from Stand, [employer] Transfers Sit to Stand: Mod assist, Max assist, +2 physical assistance, From elevated surface Stand pivot  transfers: Total assist General transfer comment: sit to stand x 4 - unsteadiness when rising to feet - patient feels as if knees will buckle; use of Stedy for bed to recliner - attempted stand pivot with patient unable     Ambulation / Gait / Stairs / Wheelchair Mobility       Posture / Balance Balance Overall balance assessment: Needs assistance Sitting-balance support: Single extremity supported, Bilateral upper extremity supported, Feet supported Sitting balance-Leahy Scale: Good Standing balance support: Bilateral upper extremity supported, During functional activity Standing balance-Leahy Scale: Zero Standing balance comment: rw and +2 assist to static stand    Special needs/care consideration BiPAP/CPAP had CPAP and O2 at home but did not use due to claustrophobia CPM n/a Continuous Drip IV n/a Dialysis  N/a Life Vest n/a Oxygen at 4 liters [employer] Special Bed n/a Trach Size n/a Wound Vac n/a Skin intact Bowel mgmt: continent LBM 12/27/17 Bladder mgmt: incontinent at times Diabetic mgmt Hgb A1c 7.8   Previous Home Environment Living Arrangements: Spouse/significant other, Children  Lives With: Spouse, Family Available Help at Discharge: (wife works days as principal in [place]) Type of Home:  [employer] Layout: Two level, Bed/bath upstairs, Able to live on main level with bedroom/bathroom Alternate Level Stairs-Rails: Left Alternate Level Stairs-Number of Steps: 20 Home Access: Stairs to enter Entrance Stairs-Rails: None Entrance Stairs-Number of Steps: 3 Bathroom Shower/Tub: Public librarian, Architectural technologist: Programmer, systems: Yes Home Care Services: No Additional Comments: 2 kids ages 33 and 23  Discharge Living Setting Plans for Discharge Living Setting: Patient's home, Lives with (comment)(wife and children) Type of Home at Discharge: House Discharge Home Layout: Two level, Able to live on main level with bedroom/bathroom Alternate Level Stairs-Rails: Left Alternate Level Stairs-Number of Steps: 20 Discharge Home Access: Stairs to enter Entrance Stairs-Rails: None Entrance Stairs-Number of Steps: 3 Discharge Bathroom Shower/Tub: Tub/shower unit, Curtain Discharge Bathroom Toilet: Standard Discharge Bathroom Accessibility: Yes How Accessible: Accessible via walker Does the patient have any problems obtaining your medications?: No  Social/Family/Support Systems Patient Roles: Spouse, Parent Contact Information: Freda Munro, wife Anticipated Caregiver: wife after work; pt's Mom can be with him during the day Anticipated Caregiver's Contact Information: see above Ability/Limitations of Caregiver: wife works days Caregiver Availability: 24/7 Discharge Plan Discussed with Primary Caregiver: Yes Is Caregiver In Agreement with Plan?: Yes Does Caregiver/Family have Issues with Lodging/Transportation while Pt is in Rehab?: No   Goals/Additional Needs Patient/Family Goal for Rehab: supervision to min assist with PT and OT Expected length of stay: ELOS 20 to 24 days Pt/Family Agrees to Admission and willing to participate: Yes Program Orientation Provided & Reviewed with Pt/Caregiver Including Roles  & Responsibilities: Yes   Decrease burden of Care  through IP rehab admission: n/a  Possible need for SNF placement upon discharge: not anticipated  Patient Condition: This patient's medical and functional status has changed since the consult dated: 01/02/2018 in which the Rehabilitation Physician determined and documented that the patient's condition is appropriate for intensive rehabilitative care in an inpatient rehabilitation facility. See "History of Present Illness" (above) for medical update. Functional changes are: overall max assist. Patient's medical and functional status update has been discussed with the Rehabilitation physician and patient remains appropriate for inpatient rehabilitation. Will admit to inpatient rehab today.  Preadmission Screen Completed By:  Cleatrice Burke, 01/04/2018 1:01 PM ______________________________________________________________________   Discussed status with Dr. Letta Pate on 01/04/2018 at  1301 and received telephone approval for admission today.  Admission Coordinator:  Cleatrice Burke, time 3276 Date 01/04/2018

## 2018-01-04 NOTE — H&P (Signed)
Physical Medicine and Rehabilitation Admission H&P        Chief Complaint  Patient presents with  . Flank Pain  : HPI: Jay Chen is a 52 year old right-handed male with history of diastolic/systolic congestive heart failure, CKD stage III, history of thoracic aneurysm, diabetes mellitus, hypertension, hyperlipidemia, morbid obesity/OSA.  Presented 12/28/2017 with right side flank pain and bilateral lower extremity weakness.  Per chart review patient lives with spouse and children ages 5 and 88.  Reported to be independent driving prior to admission and currently unemployed.  Two-level home with bedroom on Main level and 3 steps to entry.  Wife works during the day.  Mother can check on him as needed.  Chest x-ray showed mild vascular congestion and borderline cardiomegaly as well as suspected opacity at the left lung base could not exclude pneumonia and was placed on Rocephin as well as doxycycline.  CT renal stone study no acute abnormality seen.  There was a 2 mm nonobstructing stone at the lower pole of the right kidney.  Ultrasound aorta without abdominal aortic aneurysm noted.  MRI of the brain showed small acute posterior temporal lobe infarction.  MRI lumbar thoracic cervical spine showed L4-5 broad-based disc bulge.  Moderate bilateral facet arthropathy with facet effusions.  L5-S1 with moderate right facet arthropathy.  MRI cervical spine with C4-5 severe cord compression due to disc protrusion with cord edema.  Echocardiogram with ejection fraction 25%.  Systolic function severely reduced.  Carotid Dopplers with no ICA stenosis.  Close monitoring of renal function 2.31-2.72.  Cardiology services consulted in regards to CHF as well as persistent atrial fibrillation remained on Coreg as well as Lasix and Lopressor.  Xarelto was added for atrial fibrillation with cardiac rate controlled.  There was question of left lower lobe pneumonia per chest x-ray with follow-up chest x-ray completed after  broad-spectrum antibiotics currently completing course of Omnicef and doxycycline.  Neurology consulted and follow-up initially maintained on prednisone Dosepak for severe spinal cord compression with lumbar radiculopathy and no plan for surgical intervention at this time per Dr. Kathyrn Sheriff and plan to address as outpatient.  Physical and occupational therapy evaluations completed with recommendations of physical medicine rehab consult.  Patient was admitted for a comprehensive rehab program.   Review of Systems  Constitutional: Negative for chills and fever.  HENT: Negative for hearing loss.   Eyes: Negative for blurred vision and double vision.  Respiratory: Negative for cough.        Intermittent shortness of breath with exertion  Cardiovascular: Positive for leg swelling. Negative for chest pain and palpitations.  Gastrointestinal: Positive for constipation. Negative for nausea and vomiting.  Genitourinary: Negative for dysuria and hematuria.  Musculoskeletal: Positive for joint pain and myalgias.  Skin: Negative for rash.  All other systems reviewed and are negative.       Past Medical History:  Diagnosis Date  . CKD (chronic kidney disease), stage III ([place])    . Congestive heart failure [facility]) May of 2011    Felt to have cor pulmonale; EF 45 to 50% from echo in May 2011  . Cor pulmonale (chronic) ([place])    . Gout      "take daily RX" (12/28/2017)  . Hyperlipidemia    . Hypertension    . Morbid obesity ([place])    . Persistent atrial fibrillation ([place])      Jay Chen 12/28/2017  . Sleep apnea      "dx'd; couldn't tolerate CPAP" (12/28/2017)  . Thoracic aneurysm  a. 4.8cm thoracic aortic aneurysm by CT 2013.  [name] Thoracic aortic aneurysm ([place])      known/notes 12/28/2017  . Type II diabetes mellitus ([place])           Past Surgical History:  Procedure Laterality Date  . LAPAROSCOPIC CHOLECYSTECTOMY   2000  . US ECHOCARDIOGRAPHY   09/21/2009    EF 45-50%; Cavity size was severely dilated,  severe concentric hypertrophy and normal wall motion         Family History  Problem Relation Age of Onset  . Hypertension Mother    . Pancreatic cancer Father    . Prostate cancer Father    . Colon cancer Father      Social History:  reports that he has never smoked. He has never used smokeless tobacco. He reports that he drank alcohol. He reports that he does not use drugs. Allergies: No Known Allergies       Medications Prior to Admission  Medication Sig Dispense Refill  . albuterol (PROVENTIL HFA;VENTOLIN HFA) 108 (90 BASE) MCG/ACT inhaler Inhale 2 puffs into the lungs every 6 (six) hours as needed for wheezing or shortness of breath.      . allopurinol (ZYLOPRIM) 100 MG tablet Take 1 tablet (100 mg total) by mouth daily. 90 tablet 0  . atorvastatin (LIPITOR) 40 MG tablet Take 1 tablet (40 mg total) by mouth daily. 90 tablet 3  . carvedilol (COREG) 6.25 MG tablet Take 1 tablet (6.25 mg total) by mouth 2 (two) times daily. 180 tablet 3  . cloNIDine (CATAPRES) 0.1 MG tablet Take 1 tab in the am and 2 tab every pm. (Patient taking differently: Take 0.1-0.2 mg by mouth See admin instructions. Take 1 tablet every morning and take 2 tablets every evening) 270 tablet 0  . dapagliflozin propanediol (FARXIGA) 5 MG TABS tablet Take 5 mg by mouth daily. 90 tablet 1  . furosemide (LASIX) 40 MG tablet Take 1 tablet (40 mg total) by mouth daily. 90 tablet 1  . magnesium oxide (MAG-OX) 400 MG tablet Take 1 tablet (400 mg total) by mouth daily. 90 tablet 1  . metFORMIN (GLUCOPHAGE) 1000 MG tablet Take 1 tablet (1,000 mg total) by mouth 2 (two) times daily with a meal. 180 tablet 1  . omega-3 acid ethyl esters (LOVAZA) 1 G capsule Take 2 capsules (2 g total) by mouth 2 (two) times daily. 120 capsule 11  . potassium chloride SA (KLOR-CON M20) 20 MEQ tablet Take 2 tablets (40 mEq total) by mouth daily. 180 tablet 1  . rivaroxaban (XARELTO) 20 MG TABS tablet Take 1 tablet (20 mg total) by mouth daily with  supper. 90 tablet 1  . sacubitril-valsartan (ENTRESTO) 24-26 MG Take 1 tablet by mouth 2 (two) times daily. 60 tablet 5  . Exenatide ER (BYDUREON BCISE) 2 MG/0.85ML AUIJ Inject 1 Act into the skin once a week. (Patient not taking: Reported on 12/28/2017) 12 pen 1  . glucose blood (BAYER CONTOUR NEXT TEST) test strip Use BID 100 each 12  . Lancets Misc. (UNISTIK 1) MISC Test up to twice daily 50 each 11      Drug Regimen Review Drug regimen was reviewed and remains appropriate with no significant issues identified   Home: Home Living Family/patient expects to be discharged to:: Private residence Living Arrangements: Spouse/significant other, Children Available Help at Discharge: Family, Available PRN/intermittently Type of Home: House Home Access: Stairs to enter [employer] of Steps: 3 Entrance Stairs-Rails: None Home Layout: Two level,  Bed/bath upstairs, Able to live on main level with bedroom/bathroom Alternate Level Stairs-Number of Steps: 20 Alternate Level Stairs-Rails: Left Bathroom Shower/Tub: Tub/shower unit, Air cabin crew Accessibility: Yes   Functional History: Prior Function Level of Independence: Independent Comments: drives   Functional Status:  Mobility: Bed Mobility Overal bed mobility: Needs Assistance Bed Mobility: Rolling Rolling: Max assist, +2 for physical assistance General bed mobility comments: maxAx2 for rolling to provide pericare and change linens   ADL:   Cognition: Cognition Overall Cognitive Status: Within Functional Limits for tasks assessed Orientation Level: Oriented X4 Cognition Arousal/Alertness: Awake/alert Behavior During Therapy: WFL for tasks assessed/performed Overall Cognitive Status: Within Functional Limits for tasks assessed General Comments: unaware that he was soaked in urine   Physical Exam: Blood pressure (!) 121/98, pulse 97, temperature 98.3 F (36.8 C), temperature source Oral,  resp. rate 14, height 5' 8"  (1.727 m), weight (!) 155.1 kg, SpO2 93 %. Physical Exam  Vitals reviewed. Constitutional: He is oriented to person, place, and time.  52 year old right-handed obese male  HENT:  Head: Normocephalic.  Eyes: EOM are normal. Right eye exhibits no discharge. Left eye exhibits no discharge.  Neck: Normal range of motion. Neck supple. No thyromegaly present.  Cardiovascular: Regular rhythm.  Cardiac rate controlled  Respiratory: Effort normal and breath sounds normal. No respiratory distress.  GI: Soft. Bowel sounds are normal. He exhibits no distension.  Neurological: He is alert and oriented to person, place, and time.  Follows full commands.  Fair awareness of deficits  Skin: Skin is warm and dry.   Motor strength is 4-/5 bilateral hip flexor, 4/5 knee extensor ankle dorsiflexor  5/5 bilateral deltoid bicep tricep grip Sensation intact to light touch bilateral upper and lower limb There is no ataxia with finger-nose-finger testing of both lower upper limbs Lab Results Last 48 Hours        Results for orders placed or performed during the hospital encounter of 12/27/17 (from the past 48 hour(s))  Glucose, capillary     Status: Abnormal    Collection Time: 12/31/17  4:37 PM  Result Value Ref Range    Glucose-Capillary 128 (H) 70 - 99 mg/dL  Glucose, capillary     Status: Abnormal    Collection Time: 12/31/17  9:33 PM  Result Value Ref Range    Glucose-Capillary 140 (H) 70 - 99 mg/dL  CBC with Differential/Platelet     Status: Abnormal    Collection Time: 01/01/18  1:59 AM  Result Value Ref Range    WBC 8.1 4.0 - 10.5 K/uL    RBC 3.99 (L) 4.22 - 5.81 MIL/uL    Hemoglobin 12.4 (L) 13.0 - 17.0 g/dL    HCT 38.4 (L) 39.0 - 52.0 %    MCV 96.2 78.0 - 100.0 fL    MCH 31.1 26.0 - 34.0 pg    MCHC 32.3 30.0 - 36.0 g/dL    RDW 13.6 11.5 - 15.5 %    Platelets 234 150 - 400 K/uL    Neutrophils Relative % 71 %    Neutro Abs 5.7 1.7 - 7.7 K/uL    Lymphocytes  Relative 13 %    Lymphs Abs 1.1 0.7 - 4.0 K/uL    Monocytes Relative 13 %    Monocytes Absolute 1.1 (H) 0.1 - 1.0 K/uL    Eosinophils Relative 3 %    Eosinophils Absolute 0.2 0.0 - 0.7 K/uL    Basophils Relative 0 %    Basophils Absolute 0.0 0.0 -  0.1 K/uL    Immature Granulocytes 0 %    Abs Immature Granulocytes 0.0 0.0 - 0.1 K/uL      Comment: Performed at [facility], [place] [address], [place], [place] [address]  Basic metabolic panel     Status: Abnormal    Collection Time: 01/01/18  1:59 AM  Result Value Ref Range    Sodium 138 135 - 145 mmol/L    Potassium 3.7 3.5 - 5.1 mmol/L    Chloride 93 (L) 98 - 111 mmol/L    CO2 34 (H) 22 - 32 mmol/L    Glucose, Bld 121 (H) 70 - 99 mg/dL    BUN 54 (H) 6 - 20 mg/dL    Creatinine, Ser 1.98 (H) 0.61 - 1.24 mg/dL    Calcium 8.9 8.9 - 10.3 mg/dL    GFR calc non Af Amer 37 (L) >60 mL/min    GFR calc Af Amer 43 (L) >60 mL/min      Comment: (NOTE) The eGFR has been calculated using the CKD EPI equation. This calculation has not been validated in all clinical situations. eGFR's persistently <60 mL/min signify possible Chronic Kidney Disease.      Anion gap 11 5 - 15      Comment: Performed at [facility] [address] [place], [place] [address]  Lipid panel     Status: Abnormal    Collection Time: 01/01/18  1:59 AM  Result Value Ref Range    Cholesterol 116 0 - 200 mg/dL    Triglycerides 85 <150 mg/dL    HDL 24 (L) >40 mg/dL    Total CHOL/HDL Ratio 4.8 RATIO    VLDL 17 0 - 40 mg/dL    LDL Cholesterol 75 0 - 99 mg/dL      Comment:        Total Cholesterol/HDL:CHD Risk Coronary Heart Disease Risk Table                     Men   Women  1/2 Average Risk   3.4   3.3  Average Risk       5.0   4.4  2 X Average Risk   9.6   7.1  3 X Average Risk  23.4   11.0        Use the calculated Patient Ratio above and the CHD Risk Table to determine the patient's CHD Risk.        ATP III CLASSIFICATION (LDL):  <100     mg/dL    Optimal  100-129  mg/dL   Near or Above                    Optimal  130-159  mg/dL   Borderline  160-189  mg/dL   High  >190     mg/dL   Very High Performed at [facility] [address] [place], [place] [address]    Hemoglobin A1c     Status: Abnormal    Collection Time: 01/01/18  7:31 AM  Result Value Ref Range    Hgb A1c MFr Bld 7.8 (H) 4.8 - 5.6 %      Comment: (NOTE)         Prediabetes: 5.7 - 6.4         Diabetes: >6.4         Glycemic control for adults with diabetes: <7.0      Mean Plasma Glucose 177 mg/dL  Comment: (NOTE) Performed At: [facility] [place], [place] [id number] Rush Farmer MD EX:[id number]    Glucose, capillary     Status: Abnormal    Collection Time: 01/01/18  9:21 AM  Result Value Ref Range    Glucose-Capillary 112 (H) 70 - 99 mg/dL  Glucose, capillary     Status: Abnormal    Collection Time: 01/01/18 12:43 PM  Result Value Ref Range    Glucose-Capillary 149 (H) 70 - 99 mg/dL  Glucose, capillary     Status: Abnormal    Collection Time: 01/01/18  5:08 PM  Result Value Ref Range    Glucose-Capillary 229 (H) 70 - 99 mg/dL  Glucose, capillary     Status: Abnormal    Collection Time: 01/01/18  9:47 PM  Result Value Ref Range    Glucose-Capillary 178 (H) 70 - 99 mg/dL    Comment 1 Notify RN      Comment 2 Document in Chart    Basic metabolic panel     Status: Abnormal    Collection Time: 01/02/18  4:36 AM  Result Value Ref Range    Sodium 140 135 - 145 mmol/L    Potassium 4.0 3.5 - 5.1 mmol/L    Chloride 94 (L) 98 - 111 mmol/L    CO2 32 22 - 32 mmol/L    Glucose, Bld 190 (H) 70 - 99 mg/dL    BUN 60 (H) 6 - 20 mg/dL    Creatinine, Ser 1.73 (H) 0.61 - 1.24 mg/dL    Calcium 9.3 8.9 - 10.3 mg/dL    GFR calc non Af Amer 43 (L) >60 mL/min    GFR calc Af Amer 50 (L) >60 mL/min      Comment: (NOTE) The eGFR has been calculated using the CKD EPI equation. This calculation has not been validated in all clinical  situations. eGFR's persistently <60 mL/min signify possible Chronic Kidney Disease.      Anion gap 14 5 - 15      Comment: Performed at [facility] [address], [place], [place] [address]  Glucose, capillary     Status: Abnormal    Collection Time: 01/02/18  7:27 AM  Result Value Ref Range    Glucose-Capillary 152 (H) 70 - 99 mg/dL  Glucose, capillary     Status: Abnormal    Collection Time: 01/02/18 12:08 PM  Result Value Ref Range    Glucose-Capillary 173 (H) 70 - 99 mg/dL       Imaging Results (Last 48 hours)  Mr Jodene Nam Head Wo Contrast   Result Date: 01/01/2018 CLINICAL DATA:  Stroke follow-up EXAM: MRA HEAD WITHOUT CONTRAST TECHNIQUE: Angiographic images of the Circle of Willis were obtained using MRA technique without intravenous contrast. COMPARISON:  Brain MRI yesterday FINDINGS: Symmetric carotid and vertebral arteries. Vessels are smooth and widely patent. Negative for aneurysm. IMPRESSION: Negative intracranial MRA. Electronically Signed   By: Monte Fantasia M.D.   On: 01/01/2018 08:32    Mr Cervical Spine Wo Contrast   Result Date: 01/01/2018 CLINICAL DATA:  Painful myelopathy EXAM: MRI CERVICAL SPINE WITHOUT CONTRAST TECHNIQUE: Multiplanar, multisequence MR imaging of the cervical spine was performed. No intravenous contrast was administered. COMPARISON:  Cervical spine CT 09/27/2009 FINDINGS: Alignment: Reversal of cervical lordosis with 1-2 mm of C3-4 anterolisthesis. Vertebrae: No fracture, evidence of discitis, or bone lesion. Cord: T2 hyperintensity about the compressed cord at C4-5. Posterior Fossa, vertebral arteries, paraspinal tissues: Trace prevertebral edema at C3 and  C4, possibly reflecting recent injury. There is no indication of underlying bony infection or discitis. Disc levels: C2-3: Facet spurring.  Negative disc.  Foraminal narrowing is mild. C3-4: Facet spurring asymmetric to the left with mild anterolisthesis. Mild uncovertebral spurring. Foramina are  patent. C4-5: Disc narrowing with central protrusion compressing the edematous cord. Cord compression is severe, with a 2 mm canal diameter in the midline. Disc height loss and uncovertebral spurring causes right more than left foraminal impingement C5-6: Disc narrowing and endplate degeneration with bulge compressing the cord. Bilateral foraminal impingement from disc height loss and uncovertebral spurring. C6-7: Disc narrowing and endplate degeneration with posterior disc osteophyte complex contacting the ventral cord. Mild bilateral foraminal narrowing C7-T1:Minor facet spurring.  Mild left foraminal stenosis. Case discussed with Dr. Erlinda Hong via telephone at time of signing IMPRESSION: 1. C4-5 severe cord compression due to disc protrusion, with cord edema. 2. C5-6 degenerative cord compression. 3. C6-7 mild degenerative spinal stenosis. 4. Slight prevertebral edema, question recent trauma. No evidence of discitis. 5. Bilateral foraminal impingement at C4-5 and C5-6. Electronically Signed   By: Monte Fantasia M.D.   On: 01/01/2018 08:07    Mr Thoracic Spine Wo Contrast   Result Date: 01/01/2018 CLINICAL DATA:  Acute or progressive myelopathy. Worsening weakness in both legs in severe pain. EXAM: MRI THORACIC SPINE WITHOUT CONTRAST TECHNIQUE: Multiplanar, multisequence MR imaging of the thoracic spine was performed. No intravenous contrast was administered. COMPARISON:  None. FINDINGS: Alignment:  Normal thoracic alignment. Counting sequence of the cervical spine shows C3-4 facet mediated anterolisthesis and advanced disc degeneration at C4-5, C5-6, and C6-7. per the time line a cervical MRI has been ordered. Vertebrae: No fracture, evidence of discitis, or bone lesion. Cord:  Normal signal and morphology Paraspinal and other soft tissues: Intrinsic back muscle edema minimally seen in the lower thoracic spine, continuation of lumbar spine pathology as described previously. Disc levels: Generalized spondylosis.  Disc height and hydration is well preserved. No notable facet arthropathy. Negative for cord impingement. There is symmetric diffuse foraminal patency. IMPRESSION: No acute finding.  No impingement or visible myelopathy. Electronically Signed   By: Monte Fantasia M.D.   On: 01/01/2018 07:49             Medical Problem List and Plan: 1.  Decreased functional mobility secondary to cervical stenosis with cord compression, lumbar spondylosis with radiculopathy, left temporal lobe infarction.  Patient is to follow-up outpatient with Dr. Kathyrn Sheriff to discuss possible surgical interventions for lumbar radiculopathy 2.  DVT Prophylaxis/Anticoagulation: Xarelto. 3. Pain Management: Robaxin 500 mg 3 times daily, oxycodone as needed 4. Mood: Provide emotional support 5. Neuropsych: This patient is capable of making decisions on his own behalf. 6. Skin/Wound Care: Routine skin checks 7. Fluids/Electrolytes/Nutrition: Routine in and outs with follow-up chemistries 8.  Persistent atrial fibrillation.  Cardiac rate controlled.  Amiodarone 200 mg twice daily, Toprol 100 mg  daily 9.  Chronic systolic and diastolic congestive heart failure.  Monitor for any signs of fluid overload.  Lasix 40 mg  Daily 10.LLL pneumonia.  Follow-up chest x-ray 01/03/2018 with no acute process.  Complete course of Omnicef and doxycycline 11.  Diabetes mellitus with peripheral neuropathy.  Hemoglobin A1c 8.4.  SSI.  Check blood sugars before meals and at bedtime.  Patient on Glucophage 1000 mg twice daily prior to admission currently on hold due to renal insufficiency 12.  CKD stage III.  Follow-up chemistries 13.  Hyperlipidemia.  Lovaza/Lipitor 14.  Morbid obesity.  BMI 51.99.  Dietary follow-up 15.  Constipation.  Laxative assistance  Post Admission Physician Evaluation: 1. Functional deficits secondary  to cervical myelopathy and deconditioning. 2. Patient admitted to receive collaborative, interdisciplinary care between the  physiatrist, rehab nursing staff, and therapy team. 3. Patient's level of medical complexity and substantial therapy needs in context of that medical necessity cannot be provided at a lesser intensity of care. 4. Patient has experienced substantial functional loss from his/her baseline. Judging by the patient's diagnosis, physical exam, and functional history, the patient has potential for functional progress which will result in measurable gains while on inpatient rehab.  These gains will be of substantial and practical use upon discharge in facilitating mobility and self-care at the household level. 5. Physiatrist will provide 24 hour management of medical needs as well as oversight of the therapy plan/treatment and provide guidance as appropriate regarding the interaction of the two. 6. 24 hour rehab nursing will assist in the management of  bladder management, bowel management, safety, skin/wound care, disease management, medication administration, pain management and patient education  and help integrate therapy concepts, techniques,education, etc. 7. PT will assess and treat for:  [name]  Goals are: supervision. 8. OT will assess and treat for  .  Goals are: supervision.  9. SLP will assess and treat for  .  Goals are: N/A. 10. Case Management and Social Worker will assess and treat for psychological issues and discharge planning. 11. Team conference will be held weekly to assess progress toward goals and to determine barriers to discharge. 12.  Patient will receive at least 3 hours of therapy per day at least 5 days per week. 13. ELOS and Prognosis: 20 to 24 days good      "I have personally performed a face to face diagnostic evaluation of this patient.  Additionally, I have reviewed and concur with the physician assistant's documentation above." Charlett Blake M.D. [facility] FAAPM&R (Sports Med, Neuromuscular Med) Diplomate Am Board of Electrodiagnostic Med  Cathlyn Parsons, PA-C

## 2018-01-04 NOTE — Discharge Summary (Signed)
Jay Chen, is a 52 y.o. male  DOB [birth year]  MRN [id number].  Admission date:  12/27/2017  Admitting Physician  Courage Denton Brick, MD  Discharge Date:  01/04/2018   Primary MD  [name], MD  Recommendations for primary care physician for things to follow:  -Please check CBC, BMP in 3 days -Patient on 4.5 L nasal cannula on discharge, continue to wean as tolerated, encouraged to use incentive spirometry and flutter valve frequently. -Please see recommendation regarding amiodarone taper on discharge medication list. -May need PRN colchicine or steroids for his left great toe gout pain.  Admission Diagnosis  Back pain [M54.9] Flank pain [R10.9] Acute decompensated heart failure (HCC) [I50.9]   Discharge Diagnosis  Back pain [M54.9] Flank pain [R10.9] Acute decompensated heart failure ([place]) [I50.9]    Principal Problem:   Atrial fibrillation with rapid ventricular response (HCC) Active Problems:   Obstructive sleep apnea   AKI (acute kidney injury) ([place])   Flank pain   Chronic combined systolic and diastolic heart failure (HCC)   DCM (dilated cardiomyopathy) (HCC)   PNA (pneumonia)   Cerebral embolism with cerebral infarction   Acute decompensated heart failure (HCC)   Lumbar radiculopathy   Cervical spinal cord compression (HCC)      Past Medical History:  Diagnosis Date  . CKD (chronic kidney disease), stage III ([place])   . Congestive heart failure [facility]) May of 2011   Felt to have cor pulmonale; EF 45 to 50% from echo in May 2011  . Cor pulmonale (chronic) ([place])   . Gout    "take daily RX" (12/28/2017)  . Hyperlipidemia   . Hypertension   . Morbid obesity ([place])   . Persistent atrial fibrillation ([place])    [name] 12/28/2017  . Sleep apnea    "dx'd; couldn't tolerate CPAP" (12/28/2017)  . Thoracic aneurysm    a. 4.8cm thoracic aortic aneurysm by CT 2013.  [name] Thoracic aortic aneurysm  ([place])    known/notes 12/28/2017  . Type II diabetes mellitus ([place])     Past Surgical History:  Procedure Laterality Date  . LAPAROSCOPIC CHOLECYSTECTOMY  2000  . US ECHOCARDIOGRAPHY  09/21/2009   EF 45-50%; Cavity size was severely dilated, severe concentric hypertrophy and normal wall motion       History of present illness and  Hospital Course:     Kindly see H&P for history of present illness and admission details, please review complete Labs, Consult reports and Test reports for all details in brief  HPI  from the history and physical done on the day of admission 12/28/2017  Jay Chen  is a 52 y.o. male with  pmhx relevant for systolic dysfunction CHF with an EF of 20 to 25%, diabetes, hypertension, hyperlipidemia, morbid obesity/OSA/pulmonary hypertension/right-sided heart failure who presents with right-sided flank pain.  Flank pain started acutely within the last 24 hours, again no urinary symptoms, no history of kidney stones, no fevers, no fall or trauma.  no dysuria, no hematuria, no vomiting, no diarrhea, no abdominal  pain.  Patient has some shortness of breath but no chest pains.  Patient states that lower extremities are always swollen.   Patient apparently has lost about 50 pounds since May 2019 when he was placed on diuretics  No frank chest pain, no pleuritic symptoms, has some dyspnea on exertion  In ED stone protocol without acute findings, chest x-ray suggest cardiomegaly and vascular congestion  Further work-up reveals creatinine of 2.72, patient's creatinine was 1.8 amount ago and 1.3  About 3 months ago, patient denies vomiting or diarrhea, however he has been on [place]  CBC is unremarkable, urine analysis is unremarkable, BNP is 283, no baseline available    Hospital Course  52 year old male with past medical history of systolic CHF with ejection fraction of 20 to 25%, diabetes, hypertension, hyperlipidemia, morbid obesity, obstructive  sleep apnea, pulmonary hypertension, right-sided heart failure who presented with right-sided flank pain. Patient also reported of some shortness of breath but no chest pain and bilateral lower extremity edema. CT imaging done in the emergency department did show nonobstructive renal calculi. Patient was also found to have acute kidney injury on his CKD. Chest x-ray showed cardiomegaly and mild vascular congestion. Cardiology has been consulted and following.    Acute resp failure/ hypoxemia  - due to PNA and CHF, possibly OHS underlying, does have hx of cor pulmonale as well.  -He remains on 4.5 L nasal cannula, appears to be optimally diuresed, his pneumonia is improving, chest x-ray with left lower lobe atelectasis, he was encouraged again today to use incentive spirometry and flutter valve frequently, and this to continue at CIR, wean oxygen as tolerated  Acute CVA -small acute posterior left temporal lobe infarct, likely embolic from A. Fib. -Neurology following Dr Erlinda Hong,  appreciate recommendations, continue Xarelto - neuro signed off, pt to f/u in stroke clinic in about 4 wks  A. fib RVR  -rate controlled, f/b cardiology here.  -Continue with Xarelto for anticoagulation, on Toprol-XL for heart rate control cardiology recommending to continuing to load amiodarone for a total of 5 g.  He will continue 200mg  bid through 8/19 then 200mg  daily can you with amiodarone, - per cardiology >>              - getting amio load po             - metoprolol worked better than coreg for rate control             - not likely to maintain NSR off of antiarrhythmics             - likely has left atrial appendage hematoma given acute CVA this admit, therefore will likely need              TEE before cardioversion, they will consider doing this in OP setting             - cont amiodarone and metoprolol             - continue Xarelto   Acute on chronic systolic heart failure - with new ejection  fraction 20 to 25%.  Initial chest x-ray with pulmonary edema, elevated BNP. On xarelto- doubt PE. likely element of OHS and OSA. - diuresed well per cardiology, no further aggressive diuresis, currently transition to oral Lasix - down 10kg in weights from admission  LLL pneumonia  - fevers resolved, no +cultures, on doxy po and IV rocephin - will switch to po abx (cefdinir) to complete  7d course   Cervical spine cord compression and lumbar radiculopathy- bilateral lower extremity weakness, denies not have any urinary or bowel incontinence.  - MRI C-spine cervical C4-5 disc protrusion, with cord compression and cord edema - MRI L-spine L4-5 disc bulging withspinal stenosis - MRI T-spine negative - MRI Brain small acute posterior temporal lobe infarct,probable embolic.  -Neuro surgery, Dr. Salley Scarlet recs, conservative therapy for lumbar spondylosis.  Patient is symptomatic from cervical stenosis-Neurosurg will address this as outpatient, will need surgical decompression for a C5 corpectomy. -Neurology following appreciate recommendations -Decadron 10 mg x 1 given, Monitor blood sugars with DM. - PT recommends CIR.  - check hba1c. - check echo 01/01/18-EF 20 to 25%, moderately to severely dilated, diffuse hypokinesis. - .lipid panel-LDL 75, HDL 24.  AKI on CKD stage III.  Cr- 2.7 >> 1.9.  Improved w Lasix.  Delene Loll held  - creat leveled off today 1.7- 1.8  - lasix decreased to 40 po qd per cards   OSA- not plans with CPAP at home. - CPAP QHS  DM2 . Hgba1c-- 8.4 - Metformin and Farxiga at home held for AKI, continue to hold, he will be discharged on insulin sliding scale   morbid obesity obstructive sleep apnea and pulmonary hypertension patient noncompliant with CPAP encouraged to use while in the hospital.  Thoracic aortic aneurysm need outpatient follow-up.  Hypokalemia resolved.ON DAILY KDUR with lasix.  Gout -Patient complains of left  great toe pain this morning, resembles his previous gout pain, I will give 1 dose of colchicine, home Uloric acid.  Discharge Condition:  Stable   Follow UP  Follow-up Information    [facility]. Schedule an appointment as soon as possible for a visit in 4 week(s).   Specialty:  Radiology Contact information: [address] [place] [phone]       Lendon Colonel, NP Follow up.   Specialties:  Nurse Practitioner, Radiology, Cardiology Why:  Cardiology hospital follow up on September 4th at 1:30. Please arrive 15 minutes early for check in.  Contact information: [address] [place] [address] [phone]             Discharge Instructions  and  Discharge Medications     Discharge Instructions    Discharge instructions   Complete by:  As directed    Continue with heart healthy, carbohydrate modified with fluid restriction 1500 cc/day   Increase activity slowly   Complete by:  As directed      Allergies as of 01/04/2018   No Known Allergies     Medication List    STOP taking these medications   albuterol 108 (90 Base) MCG/ACT inhaler Commonly known as:  PROVENTIL HFA;VENTOLIN HFA Replaced by:  albuterol (2.5 MG/3ML) 0.083% nebulizer solution   carvedilol 6.25 MG tablet Commonly known as:  COREG   cloNIDine 0.1 MG tablet Commonly known as:  CATAPRES   dapagliflozin propanediol 5 MG Tabs tablet Commonly known as:  FARXIGA   Exenatide ER 2 MG/0.85ML Auij   glucose blood test strip   metFORMIN 1000 MG tablet Commonly known as:  GLUCOPHAGE   sacubitril-valsartan 24-26 MG Commonly known as:  ENTRESTO   UNISTIK 1 Misc     TAKE these medications   acetaminophen 325 MG tablet Commonly known as:  TYLENOL Take 2 tablets (650 mg total) by mouth every 6 (six) hours as needed for mild pain or headache (or Fever >/= 101).  albuterol (2.5 MG/3ML) 0.083% nebulizer  solution Commonly known as:  PROVENTIL Take 3 mLs (2.5 mg total) by nebulization every 2 (two) hours as needed for wheezing. Replaces:  albuterol 108 (90 Base) MCG/ACT inhaler   allopurinol 100 MG tablet Commonly known as:  ZYLOPRIM Take 1 tablet (100 mg total) by mouth daily.   amiodarone 200 MG tablet Commonly known as:  PACERONE continue 200 mg bid through 8/19 then 200mg  daily   atorvastatin 80 MG tablet Commonly known as:  LIPITOR Take 1 tablet (80 mg total) by mouth daily. Start taking on:  01/05/2018 What changed:    medication strength  how much to take   cefdinir 300 MG capsule Commonly known as:  OMNICEF Take 1 capsule (300 mg total) by mouth every 12 (twelve) hours. Continue for 2 days then stop   doxycycline 100 MG tablet Commonly known as:  VIBRA-TABS Take 1 tablet (100 mg total) by mouth every 12 (twelve) hours. Continue for 2 days then stop   feeding supplement (ENSURE ENLIVE) Liqd Take 237 mLs by mouth 2 (two) times daily between meals.   furosemide 40 MG tablet Commonly known as:  LASIX Take 1 tablet (40 mg total) by mouth daily.   insulin aspart 100 UNIT/ML injection Commonly known as:  novoLOG Inject 0-5 Units into the skin at bedtime.   insulin aspart 100 UNIT/ML injection Commonly known as:  novoLOG Inject 0-9 Units into the skin 3 (three) times daily with meals.   magnesium oxide 400 MG tablet Commonly known as:  MAG-OX Take 1 tablet (400 mg total) by mouth daily.   metoprolol succinate 100 MG 24 hr tablet Commonly known as:  TOPROL-XL Take 1 tablet (100 mg total) by mouth daily. Take with or immediately following a meal. Start taking on:  01/05/2018   omega-3 acid ethyl esters 1 g capsule Commonly known as:  LOVAZA Take 2 capsules (2 g total) by mouth 2 (two) times daily.   Oxycodone HCl 10 MG Tabs Take 0.5 tablets (5 mg total) by mouth every 6 (six) hours as needed for severe pain.   polyethylene glycol packet Commonly known as:   MIRALAX / GLYCOLAX Take 17 g by mouth daily as needed for mild constipation.   potassium chloride SA 20 MEQ tablet Commonly known as:  K-DUR,KLOR-CON Take 2 tablets (40 mEq total) by mouth daily.   rivaroxaban 20 MG Tabs tablet Commonly known as:  XARELTO Take 1 tablet (20 mg total) by mouth daily with supper.   senna-docusate 8.6-50 MG tablet Commonly known as:  Senokot-S Take 2 tablets by mouth at bedtime.         Diet and Activity recommendation: See Discharge Instructions above   Consults obtained -  Cardiology Neurology Inpatient rehab    Major procedures and Radiology Reports - PLEASE review detailed and final reports for all details, in brief -      Dg Chest 1 View  Result Date: 12/29/2017 CLINICAL DATA:  Follow-up exam. Requiring increased oxygen to keep O2 sats up. EXAM: CHEST  1 VIEW COMPARISON:  Chest x-ray dated 12/28/2016. FINDINGS: Stable cardiomegaly. Probable opacity at the LEFT lung base, difficult to characterize due to the overlying heart shadow. Persistent vascular congestion within the perihilar regions. RIGHT lung otherwise clear. No pneumothorax seen. IMPRESSION: 1. Cardiomegaly with central pulmonary vascular congestion suggesting mild CHF/volume overload. 2. Suspected opacity at the LEFT lung base, difficult to characterize due to the overlying heart. Cannot exclude pneumonia, atelectasis and/or pleural effusion at the  LEFT lung base. Electronically Signed   By: Franki Cabot M.D.   On: 12/29/2017 19:25   Dg Chest 2 View  Result Date: 01/03/2018 CLINICAL DATA:  Follow-up LEFT LOWER LOBE atelectasis and/or pneumonia and mild CHF. EXAM: CHEST - 2 VIEW COMPARISON:  12/29/2017, 12/28/2017 and CT chest 03/28/2012 and earlier. FINDINGS: AP SEMI-ERECT and LATERAL images were obtained. Cardiac silhouette markedly enlarged, increased in size since 2013. Hilar and mediastinal contours otherwise unremarkable. Interval resolution of the interstitial pulmonary  edema since the examination 5 days ago. Improved aeration in the LEFT LOWER LOBE without significant residual airspace consolidation. No new pulmonary parenchymal abnormalities. No pleural effusions. Degenerative changes involving the thoracic and UPPER lumbar spine. IMPRESSION: 1. Marked cardiomegaly.  No acute cardiopulmonary disease currently. 2. Interval resolution of interstitial pulmonary edema and LEFT LOWER LOBE atelectasis and/or pneumonia since the examination 5 days ago. Electronically Signed   By: Evangeline Dakin M.D.   On: 01/03/2018 13:08   Dg Chest 2 View  Result Date: 12/28/2017 CLINICAL DATA:  Acute onset of generalized abdominal pain and hypoxia. EXAM: CHEST - 2 VIEW COMPARISON:  CTA of the chest performed 03/28/2012 FINDINGS: The lungs are well-aerated. Mild vascular congestion is noted. Bibasilar airspace opacities may reflect mild interstitial edema. There is no evidence of significant pleural effusion or pneumothorax. The heart is borderline enlarged. No acute osseous abnormalities are seen. IMPRESSION: Mild vascular congestion and borderline cardiomegaly. Bibasilar airspace opacities may reflect mild interstitial edema. Electronically Signed   By: Garald Balding M.D.   On: 12/28/2017 05:34   Mr Jodene Nam Head Wo Contrast  Result Date: 01/01/2018 CLINICAL DATA:  Stroke follow-up EXAM: MRA HEAD WITHOUT CONTRAST TECHNIQUE: Angiographic images of the Circle of Willis were obtained using MRA technique without intravenous contrast. COMPARISON:  Brain MRI yesterday FINDINGS: Symmetric carotid and vertebral arteries. Vessels are smooth and widely patent. Negative for aneurysm. IMPRESSION: Negative intracranial MRA. Electronically Signed   By: Monte Fantasia M.D.   On: 01/01/2018 08:32   Mr Brain Wo Contrast  Result Date: 12/31/2017 CLINICAL DATA:  Generalized weakness. EXAM: MRI HEAD WITHOUT CONTRAST TECHNIQUE: Multiplanar, multiecho pulse sequences of the brain and surrounding structures were  obtained without intravenous contrast. COMPARISON:  Head CT 02/08/2015 FINDINGS: Brain: There is a small acute white matter infarct in the posterosuperior left temporal lobe adjacent to the lateral ventricle measuring 1.2 cm in maximal dimension. Multiple chronic microhemorrhages are present in both cerebral hemispheres as well as in the right thalamus and right lentiform nucleus. Small foci of T2 hyperintensity in the cerebral white matter bilaterally are nonspecific but compatible with mild chronic small vessel ischemic disease. The ventricles and sulci are normal for age. No mass, midline shift, or extra-axial fluid collection is seen. Vascular: Major intracranial vascular flow voids are preserved. Skull and upper cervical spine: Unremarkable bone marrow signal. Sinuses/Orbits: Unremarkable orbits. Right larger than left maxillary sinus mucous retention cysts. Clear mastoid air cells. Other: None. IMPRESSION: 1. Small acute posterior left temporal lobe infarct. 2. Mild chronic small vessel ischemic disease and scattered chronic microhemorrhages. Electronically Signed   By: Logan Bores M.D.   On: 12/31/2017 11:30   Mr Cervical Spine Wo Contrast  Result Date: 01/01/2018 CLINICAL DATA:  Painful myelopathy EXAM: MRI CERVICAL SPINE WITHOUT CONTRAST TECHNIQUE: Multiplanar, multisequence MR imaging of the cervical spine was performed. No intravenous contrast was administered. COMPARISON:  Cervical spine CT 09/27/2009 FINDINGS: Alignment: Reversal of cervical lordosis with 1-2 mm of C3-4 anterolisthesis. Vertebrae: No fracture, evidence  of discitis, or bone lesion. Cord: T2 hyperintensity about the compressed cord at C4-5. Posterior Fossa, vertebral arteries, paraspinal tissues: Trace prevertebral edema at C3 and C4, possibly reflecting recent injury. There is no indication of underlying bony infection or discitis. Disc levels: C2-3: Facet spurring.  Negative disc.  Foraminal narrowing is mild. C3-4: Facet spurring  asymmetric to the left with mild anterolisthesis. Mild uncovertebral spurring. Foramina are patent. C4-5: Disc narrowing with central protrusion compressing the edematous cord. Cord compression is severe, with a 2 mm canal diameter in the midline. Disc height loss and uncovertebral spurring causes right more than left foraminal impingement C5-6: Disc narrowing and endplate degeneration with bulge compressing the cord. Bilateral foraminal impingement from disc height loss and uncovertebral spurring. C6-7: Disc narrowing and endplate degeneration with posterior disc osteophyte complex contacting the ventral cord. Mild bilateral foraminal narrowing C7-T1:Minor facet spurring.  Mild left foraminal stenosis. Case discussed with Dr. Erlinda Hong via telephone at time of signing IMPRESSION: 1. C4-5 severe cord compression due to disc protrusion, with cord edema. 2. C5-6 degenerative cord compression. 3. C6-7 mild degenerative spinal stenosis. 4. Slight prevertebral edema, question recent trauma. No evidence of discitis. 5. Bilateral foraminal impingement at C4-5 and C5-6. Electronically Signed   By: Monte Fantasia M.D.   On: 01/01/2018 08:07   Mr Thoracic Spine Wo Contrast  Result Date: 01/01/2018 CLINICAL DATA:  Acute or progressive myelopathy. Worsening weakness in both legs in severe pain. EXAM: MRI THORACIC SPINE WITHOUT CONTRAST TECHNIQUE: Multiplanar, multisequence MR imaging of the thoracic spine was performed. No intravenous contrast was administered. COMPARISON:  None. FINDINGS: Alignment:  Normal thoracic alignment. Counting sequence of the cervical spine shows C3-4 facet mediated anterolisthesis and advanced disc degeneration at C4-5, C5-6, and C6-7. per the time line a cervical MRI has been ordered. Vertebrae: No fracture, evidence of discitis, or bone lesion. Cord:  Normal signal and morphology Paraspinal and other soft tissues: Intrinsic back muscle edema minimally seen in the lower thoracic spine, continuation of  lumbar spine pathology as described previously. Disc levels: Generalized spondylosis. Disc height and hydration is well preserved. No notable facet arthropathy. Negative for cord impingement. There is symmetric diffuse foraminal patency. IMPRESSION: No acute finding.  No impingement or visible myelopathy. Electronically Signed   By: Monte Fantasia M.D.   On: 01/01/2018 07:49   Mr Lumbar Spine Wo Contrast  Result Date: 12/31/2017 CLINICAL DATA:  Right flank pain for several days EXAM: MRI LUMBAR SPINE WITHOUT CONTRAST TECHNIQUE: Multiplanar, multisequence MR imaging of the lumbar spine was performed. No intravenous contrast was administered. COMPARISON:  None. FINDINGS: Segmentation:  Standard. Alignment:  Physiologic. Vertebrae:  No fracture, evidence of discitis, or bone lesion. Conus medullaris and cauda equina: Conus extends to the T12 level. Conus and cauda equina appear normal. Paraspinal and other soft tissues: No acute paraspinal abnormality. There is muscle edema in the multifidus muscle at the level of L4-5, left greater than right likely reflecting muscle strain versus an inflammatory etiology. Disc levels: Disc spaces: Disc spaces are maintained. T12-L1: No significant disc bulge. No evidence of neural foraminal stenosis. No central canal stenosis. L1-L2: No significant disc bulge. No evidence of neural foraminal stenosis. No central canal stenosis. L2-L3: No significant disc bulge. No evidence of neural foraminal stenosis. No central canal stenosis. L3-L4: Minimal broad-based disc bulge. No evidence of neural foraminal stenosis. No central canal stenosis. L4-L5: Broad-based disc bulge. Moderate bilateral facet arthropathy with facet effusions. Moderate spinal stenosis and bilateral lateral recess stenosis. Moderate  left foraminal stenosis. Mild right foraminal stenosis. L5-S1: No significant disc bulge. No central canal stenosis. Moderate right facet arthropathy. 9 mm right facet synovial cyst  projecting into the right neural foramen and resulting in moderate foraminal stenosis. IMPRESSION: 1. At L4-5 there is a broad-based disc bulge. Moderate bilateral facet arthropathy with facet effusions. Moderate spinal stenosis and bilateral lateral recess stenosis. Moderate left foraminal stenosis. Mild right foraminal stenosis. 2. At L5-S1 there is moderate right facet arthropathy. 9 mm right facet synovial cyst projecting into the right neural foramen and resulting in moderate foraminal stenosis. 3. There is muscle edema in the multifidus muscle at the level of L4-5, left greater than right likely reflecting muscle strain versus an inflammatory etiology. Electronically Signed   By: Kathreen Devoid   On: 12/31/2017 12:34   US Aorta Complete  Result Date: 12/28/2017 CLINICAL DATA:  Back pain.  History of thoracic aneurysm. EXAM: ULTRASOUND OF ABDOMINAL AORTA TECHNIQUE: Ultrasound examination of the abdominal aorta was performed to evaluate for abdominal aortic aneurysm. COMPARISON:  CT abdomen and pelvis December 28, 2017 FINDINGS: Limited assessment due to large body habitus. Abdominal aortic measurements as follows: Proximal:  2.7 x 2.8 cm Mid:  2.2 x 2.2 cm Distal: Not sonographically identified. IMPRESSION: 1. Body habitus limited examination without abdominal aortic aneurysm. Please see CT from same day, reported separately for additional detailed. Electronically Signed   By: Elon Alas M.D.   On: 12/28/2017 20:29   Ct Renal Stone Study  Result Date: 12/28/2017 CLINICAL DATA:  Acute onset of right flank pain. EXAM: CT ABDOMEN AND PELVIS WITHOUT CONTRAST TECHNIQUE: Multidetector CT imaging of the abdomen and pelvis was performed following the standard protocol without IV contrast. COMPARISON:  CT of the abdomen and pelvis performed 04/07/2008 FINDINGS: Lower chest: Mild bibasilar atelectasis or scarring is noted. The heart is mildly enlarged. Trace pericardial fluid remains within normal limits.  Hepatobiliary: The liver is unremarkable in appearance. The patient is status post cholecystectomy, with clips noted at the gallbladder fossa. The common bile duct remains normal in caliber. Pancreas: The pancreas is within normal limits. Spleen: The spleen is unremarkable in appearance. Adrenals/Urinary Tract: A poorly characterized left adrenal adenoma is noted, measuring perhaps 1.9 cm. The right adrenal gland is unremarkable. A 2 mm nonobstructing stone is noted at the lower pole of the right kidney. There is no evidence of hydronephrosis. No obstructing ureteral stones are identified. Nonspecific perinephric stranding is noted bilaterally. Stomach/Bowel: The stomach is unremarkable in appearance. The small bowel is within normal limits. The appendix is normal in caliber, without evidence of appendicitis. The colon is unremarkable in appearance. Vascular/Lymphatic: The abdominal aorta is unremarkable in appearance. The inferior vena cava is grossly unremarkable. No retroperitoneal lymphadenopathy is seen. No pelvic sidewall lymphadenopathy is identified. Reproductive: The bladder is mildly distended and grossly unremarkable. The prostate is enlarged, measuring 5.3 cm in transverse dimension, with mild calcification. Other: No additional soft tissue abnormalities are seen. Musculoskeletal: No acute osseous abnormalities are identified. The visualized musculature is unremarkable in appearance. IMPRESSION: 1. No acute abnormality seen to explain the patient's symptoms. 2. 2 mm nonobstructing stone at the lower pole of the right kidney. 3. Mild bibasilar atelectasis or scarring noted. 4. Mild cardiomegaly. 5. Left adrenal adenoma incidentally noted. Electronically Signed   By: Garald Balding M.D.   On: 12/28/2017 05:51    Micro Results    Recent Results (from the past 240 hour(s))  MRSA PCR Screening     Status:  None   Collection Time: 12/31/17  5:05 AM  Result Value Ref Range Status   MRSA by PCR NEGATIVE  NEGATIVE Final    Comment:        The GeneXpert MRSA Assay (FDA approved for NASAL specimens only), is one component of a comprehensive MRSA colonization surveillance program. It is not intended to diagnose MRSA infection nor to guide or monitor treatment for MRSA infections. Performed at [facility], [place] [address], [place], [place] [address]        Today   Subjective:   Rodrigus Kilker today has no headache,no chest or abdominal pain complains of left great toe pain Objective:   Blood pressure (!) 123/97, pulse 95, temperature 97.8 F (36.6 C), temperature source Oral, resp. rate (!) 24, height 5\' 8"  (1.727 m), weight (!) 155.7 kg, SpO2 95 %.   Intake/Output Summary (Last 24 hours) at 01/04/2018 1055 Last data filed at 01/04/2018 0813 Gross per 24 hour  Intake 583 ml  Output 1215 ml  Net -632 ml    Exam Awake Alert, Oriented x 3, obese, on nasal cannula no new F.N deficits, Normal affect Symmetrical Chest wall movement, Good air movement bilaterally, but slightly diminished at the bases, CTAB RRR,No Gallops,Rubs or new Murmurs, No Parasternal Heave +ve B.Sounds, Abd Soft, Non tender,No rebound -guarding or rigidity. No Cyanosis, Clubbing, with trace pedal edema, No new Rash or bruise  Data Review   CBC w Diff:  Lab Results  Component Value Date   WBC 8.1 01/01/2018   HGB 12.4 (L) 01/01/2018   HCT 38.4 (L) 01/01/2018   PLT 234 01/01/2018   LYMPHOPCT 13 01/01/2018   MONOPCT 13 01/01/2018   EOSPCT 3 01/01/2018   BASOPCT 0 01/01/2018    CMP:  Lab Results  Component Value Date   NA 140 01/04/2018   NA 144 11/18/2017   K 4.0 01/04/2018   CL 96 (L) 01/04/2018   CO2 33 (H) 01/04/2018   BUN 67 (H) 01/04/2018   BUN 19 11/18/2017   CREATININE 1.85 (H) 01/04/2018   PROT 6.8 09/01/2017   ALBUMIN 3.8 09/01/2017   BILITOT 1.3 (H) 09/01/2017   ALKPHOS 82 09/01/2017   AST 14 09/01/2017   ALT 16 09/01/2017  .   Total Time in preparing paper work, data  evaluation and todays exam - 31 minutes  Phillips Climes M.D on 01/04/2018 at 10:55 AM  [facility]   Office  [phone]

## 2018-01-04 NOTE — Progress Notes (Signed)
Cristina Gong, RN  Rehab Admission Coordinator  Physical Medicine and Rehabilitation  PMR Pre-admission  Addendum  Date of Service:  01/04/2018 12:49 PM       Related encounter: ED to Hosp-Admission (Discharged) from 12/27/2017 in [place]         Show:Clear all [x] Manual[x] Template[x] Copied  Added by: [x] Julious Payer Vertis Kelch, RN  [] Hover for details PMR Admission Coordinator Pre-Admission Assessment  Patient: Jay Chen is an 52 y.o., male MRN: [id number] DOB: 1965-10-18 Height: 5\' 8"  (172.7 cm) Weight: (!) 155.7 kg                                                                                                                                                  Insurance Information HMO:     PPO: yes     PCP:      IPA:      80/20:      OTHER:  PRIMARY: [facility]      Policy#: [id number]      Subscriber: wife CM Name: Philis Fendt      Phone#: [phone]     Fax#: [phone] Pre-Cert#: [id number] approved until 8/28 when updates are due      Employer: [employer] Benefits:  Phone #: [phone]     Name: 01/03/18 Eff. Date: 05/24/2017     Deduct: $1080      Out of Pocket Max: $5468     Life Max: none CIR: $337 co pay per admit then insurance covers 70%      SNF: 70%  100 days Outpatient: $72 co pay per visit     Co-Pay: visits per medical neccesity Home Health: 70%      Co-Pay: 30% visits per medical neccesity DME: 70%     Co-Pay: 30% Providers: in network  SECONDARY: none       Medicaid Application Date:       Case Manager:  Disability Application Date:       Case Worker:   Emergency Publishing copy Information    Name Relation Home Work Mobile   [place] Spouse [phone]  [phone]   [place], Suarez Mother   [phone]     Current Medical History  Patient Admitting Diagnosis: cervical stenosis with cord compression, lumbar spondylosis with radiculopathy, left  temporal lobe infarct  History of Present Illness: WEX:HBZJ E Burnetteis a 52 year old right-handed male with history of diastolic/systolic congestive heart failure, CKD stage III, history of thoracic aneurysm, diabetes mellitus, hypertension, hyperlipidemia, morbid obesity/OSA. Presented 12/28/2017 with right side flank pain and bilateral lower extremity weakness. Chest x-ray showed mild vascular congestion and borderline cardiomegaly as well as suspected opacity at the left lung base could not exclude pneumonia and was placed on Rocephin as well as doxycycline. CT renal stone  study no acute abnormality seen. There was a 2 mm non obstructing stone at the lower pole of the right kidney. Ultrasound aorta without abdominal aortic aneurysm noted. MRI of the brain showed small acute posterior temporal lobe infarction. MRI lumbar thoracic cervical spine showed L4-5 broad-based disc bulge. Moderate bilateral facet arthropathy with facet effusions. L5-S1 with moderate right facet arthropathy. MRI cervical spine with C4-5 severe cord compression due to disc protrusion with cord edema. Echocardiogram with ejection fraction 25%. Systolic function severely reduced. Carotid Dopplers with no ICA stenosis. Close monitoring of renal function 2.31-2.72. Cardiology services consulted in regards to CHF as well as persistent atrial fibrillation remained on Coreg as well as Lasix and Lopressor. Xarelto was added for atrial fibrillation with cardiac rate controlled.There was question of left lower lobe pneumonia per chest x-ray with follow-up chest x-ray completed after broad-spectrum antibiotics currently completing course of Omnicef and doxycycline. Neurology consulted and follow-upinitiallymaintained on prednisone Dosepak for severe spinal cord compression with lumbar radiculopathy and no plan for surgical intervention at this time per Dr. Elizebeth Brooking plan to address as outpatient.   Past Medical History       Past Medical History:  Diagnosis Date  . CKD (chronic kidney disease), stage III ([place])   . Congestive heart failure [facility]) May of 2011   Felt to have cor pulmonale; EF 45 to 50% from echo in May 2011  . Cor pulmonale (chronic) ([place])   . Gout    "take daily RX" (12/28/2017)  . Hyperlipidemia   . Hypertension   . Morbid obesity ([place])   . Persistent atrial fibrillation ([place])    [name] 12/28/2017  . Sleep apnea    "dx'd; couldn't tolerate CPAP" (12/28/2017)  . Thoracic aneurysm    a. 4.8cm thoracic aortic aneurysm by CT 2013.  [name] Thoracic aortic aneurysm ([place])    known/notes 12/28/2017  . Type II diabetes mellitus ([facility])     Family History  family history includes Colon cancer in his father; Hypertension in his mother; Pancreatic cancer in his father; Prostate cancer in his father.  Prior Rehab/Hospitalizations:  Has the patient had major surgery during 100 days prior to admission? No  Current Medications   Current Facility-Administered Medications:  .  0.9 %  sodium chloride infusion, 250 mL, Intravenous, PRN, Roxan Hockey, MD, Stopped at 01/02/18 1957 .  acetaminophen (TYLENOL) tablet 650 mg, 650 mg, Oral, Q6H PRN, 650 mg at 12/30/17 1514 **OR** acetaminophen (TYLENOL) suppository 650 mg, 650 mg, Rectal, Q6H PRN, Emokpae, Courage, MD .  albuterol (PROVENTIL) (2.5 MG/3ML) 0.083% nebulizer solution 2.5 mg, 2.5 mg, Nebulization, Q2H PRN, Emokpae, Courage, MD .  amiodarone (PACERONE) tablet 200 mg, 200 mg, Oral, BID, Turner, Traci R, MD, 200 mg at 01/04/18 0811 .  atorvastatin (LIPITOR) tablet 80 mg, 80 mg, Oral, Daily, Rosalin Hawking, MD, 80 mg at 01/04/18 3086 .  cefdinir (OMNICEF) capsule 300 mg, 300 mg, Oral, Q12H, Roney Jaffe, MD, 300 mg at 01/04/18 0439 .  doxycycline (VIBRA-TABS) tablet 100 mg, 100 mg, Oral, Q12H, Roney Jaffe, MD, 100 mg at 01/04/18 0812 .  feeding supplement (ENSURE ENLIVE) (ENSURE ENLIVE) liquid 237 mL, 237 mL, Oral, BID BM,  Emokpae, Courage, MD, 237 mL at 01/04/18 0812 .  furosemide (LASIX) tablet 40 mg, 40 mg, Oral, Daily, Skeet Latch, MD, 40 mg at 01/04/18 0813 .  insulin aspart (novoLOG) injection 0-5 Units, 0-5 Units, Subcutaneous, QHS, Emokpae, Courage, MD .  insulin aspart (novoLOG) injection 0-9 Units, 0-9 Units, Subcutaneous, TID WC, Emokpae,  Courage, MD, 1 Units at 01/04/18 [phone] .  MEDLINE mouth rinse, 15 mL, Mouth Rinse, BID, Roney Jaffe, MD, 15 mL at 01/04/18 1110 .  methocarbamol (ROBAXIN) tablet 500 mg, 500 mg, Oral, TID, Shelly Coss, MD, 500 mg at 01/04/18 0812 .  metoprolol succinate (TOPROL-XL) 24 hr tablet 100 mg, 100 mg, Oral, Daily, Skeet Latch, MD, 100 mg at 01/04/18 0811 .  omega-3 acid ethyl esters (LOVAZA) capsule 2 g, 2 g, Oral, BID, Emokpae, Courage, MD, 2 g at 01/04/18 0811 .  ondansetron (ZOFRAN) tablet 4 mg, 4 mg, Oral, Q6H PRN **OR** ondansetron (ZOFRAN) injection 4 mg, 4 mg, Intravenous, Q6H PRN, Emokpae, Courage, MD .  oxyCODONE (Oxy IR/ROXICODONE) immediate release tablet 10 mg, 10 mg, Oral, Q4H PRN, Shelly Coss, MD, 10 mg at 01/04/18 0811 .  polyethylene glycol (MIRALAX / GLYCOLAX) packet 17 g, 17 g, Oral, Daily PRN, Emokpae, Courage, MD .  potassium chloride SA (K-DUR,KLOR-CON) CR tablet 40 mEq, 40 mEq, Oral, Daily, Emokpae, Courage, MD, 40 mEq at 01/04/18 0812 .  rivaroxaban (XARELTO) tablet 20 mg, 20 mg, Oral, Q supper, Alvira Philips, [place], 20 mg at 01/03/18 1707 .  senna-docusate (Senokot-S) tablet 2 tablet, 2 tablet, Oral, QHS, Emokpae, Courage, MD, 2 tablet at 01/03/18 2218 .  sodium chloride flush (NS) 0.9 % injection 3 mL, 3 mL, Intravenous, Q12H, Emokpae, Courage, MD, 3 mL at 01/04/18 0813 .  sodium chloride flush (NS) 0.9 % injection 3 mL, 3 mL, Intravenous, PRN, Denton Brick, Courage, MD  Patients Current Diet:     Diet Order                  Diet heart healthy/carb modified Room service appropriate? Yes; Fluid consistency: Thin  Diet  effective now               Precautions / Restrictions Precautions Precautions: Fall Restrictions Weight Bearing Restrictions: No   Has the patient had 2 or more falls or a fall with injury in the past year?No  Prior Activity Level Community (5-7x/wk): Independent and driving pta; retired  Development worker, international aid / Paramedic Devices/Equipment: None  Prior Device Use: Indicate devices/aids used by the patient prior to current illness, exacerbation or injury? None of the above  Prior Functional Level Prior Function Level of Independence: Independent Comments: drives  Self Care: Did the patient need help bathing, dressing, using the toilet or eating?  Independent  Indoor Mobility: Did the patient need assistance with walking from room to room (with or without device)? Independent  Stairs: Did the patient need assistance with internal or external stairs (with or without device)? Independent  Functional Cognition: Did the patient need help planning regular tasks such as shopping or remembering to take medications? Independent  Current Functional Level Cognition  Overall Cognitive Status: Within Functional Limits for tasks assessed Orientation Level: Oriented X4 General Comments: unaware that he was soaked in urine    Extremity Assessment (includes Sensation/Coordination)  Upper Extremity Assessment: Overall WFL for tasks assessed  Lower Extremity Assessment: Defer to PT evaluation RLE Deficits / Details: limited P/AROM due to pain, hip ab/ad, knee ext, and plantar flexion with less pain than hip/knee flex and ankle dorsiflexion  RLE: Unable to fully assess due to pain RLE Sensation: WNL RLE Coordination: decreased fine motor, decreased gross motor LLE Deficits / Details: limited P/AROM due to pain, hip ab/ad, knee ext, and plantar flexion with less pain than hip/knee flex and ankle dorsiflexion  LLE: Unable to fully assess  due to pain LLE  Sensation: WNL LLE Coordination: decreased fine motor, decreased gross motor    ADLs  Overall ADL's : Needs assistance/impaired Eating/Feeding: Set up, Sitting Grooming: Sitting, Min guard Upper Body Bathing: Set up, Sitting Lower Body Bathing: Maximal assistance, +2 for physical assistance, Sit to/from stand Upper Body Dressing : Set up, Sitting Lower Body Dressing: Maximal assistance, +2 for physical assistance, Sit to/from stand Toilet Transfer: Total assistance Toileting- Clothing Manipulation and Hygiene: Total assistance Tub/ Shower Transfer: Total assistance General ADL Comments: Pt completed bed mobility at min guard level, sat EOB several minutes, and 4x sit<>stand with mod-max +2 assist. Utilized bariatric stedy to transfer pt EOB>recliner.     Mobility  Overal bed mobility: Needs Assistance Bed Mobility: Supine to Sit Rolling: Max assist, +2 for physical assistance Supine to sit: HOB elevated, Supervision General bed mobility comments: supervision for line and tube management    Transfers  Overall transfer level: Needs assistance Equipment used: Rolling walker (2 wheeled) Transfer via Lift Equipment: Stedy Transfers: Sit to/from Stand, [employer] Transfers Sit to Stand: Mod assist, Max assist, +2 physical assistance, From elevated surface Stand pivot transfers: Total assist General transfer comment: sit to stand x 4 - unsteadiness when rising to feet - patient feels as if knees will buckle; use of Stedy for bed to recliner - attempted stand pivot with patient unable     Ambulation / Gait / Stairs / Wheelchair Mobility       Posture / Balance Balance Overall balance assessment: Needs assistance Sitting-balance support: Single extremity supported, Bilateral upper extremity supported, Feet supported Sitting balance-Leahy Scale: Good Standing balance support: Bilateral upper extremity supported, During functional activity Standing balance-Leahy Scale:  Zero Standing balance comment: rw and +2 assist to static stand    Special needs/care consideration BiPAP/CPAP had CPAP and O2 at home but did not use due to claustrophobia CPM n/a Continuous Drip IV n/a Dialysis  N/a Life Vest n/a Oxygen at 4 liters  Special Bed n/a Trach Size n/a Wound Vac n/a Skin intact Bowel mgmt: continent LBM 12/27/17 Bladder mgmt: incontinent at times Diabetic mgmt Hgb A1c 7.8   Previous Home Environment Living Arrangements: Spouse/significant other, Children  Lives With: Spouse, Family Available Help at Discharge: (wife works days as principal in [place]) Type of Home: [employer] Layout: Two level, Bed/bath upstairs, Able to live on main level with bedroom/bathroom Alternate Level Stairs-Rails: Left Alternate Level Stairs-Number of Steps: 20 Home Access: Stairs to enter Entrance Stairs-Rails: None Entrance Stairs-Number of Steps: 3 Bathroom Shower/Tub: Public librarian, Architectural technologist: Programmer, systems: Yes Home Care Services: No Additional Comments: 2 kids ages 63 and 50  Discharge Living Setting Plans for Discharge Living Setting: Patient's home, Lives with (comment)(wife and children) Type of Home at Discharge: House Discharge Home Layout: Two level, Able to live on main level with bedroom/bathroom Alternate Level Stairs-Rails: Left Alternate Level Stairs-Number of Steps: 20 Discharge Home Access: Stairs to enter Entrance Stairs-Rails: None Entrance Stairs-Number of Steps: 3 Discharge Bathroom Shower/Tub: Tub/shower unit, Curtain Discharge Bathroom Toilet: Standard Discharge Bathroom Accessibility: Yes How Accessible: Accessible via walker Does the patient have any problems obtaining your medications?: No  Social/Family/Support Systems Patient Roles: Spouse, Parent Contact Information: Freda Munro, wife Anticipated Caregiver: wife after work; pt's Mom can be with him during the day Anticipated  Caregiver's Contact Information: see above Ability/Limitations of Caregiver: wife works days Caregiver Availability: 24/7 Discharge Plan Discussed with Primary Caregiver: Yes Is Caregiver In Agreement with Plan?:  Yes Does Caregiver/Family have Issues with Lodging/Transportation while Pt is in Rehab?: No   Goals/Additional Needs Patient/Family Goal for Rehab: supervision to min assist with PT and OT Expected length of stay: ELOS 20 to 24 days Pt/Family Agrees to Admission and willing to participate: Yes Program Orientation Provided & Reviewed with Pt/Caregiver Including Roles  & Responsibilities: Yes   Decrease burden of Care through IP rehab admission: n/a  Possible need for SNF placement upon discharge: not anticipated  Patient Condition: This patient's medical and functional status has changed since the consult dated: 01/02/2018 in which the Rehabilitation Physician determined and documented that the patient's condition is appropriate for intensive rehabilitative care in an inpatient rehabilitation facility. See "History of Present Illness" (above) for medical update. Functional changes are: overall max assist. Patient's medical and functional status update has been discussed with the Rehabilitation physician and patient remains appropriate for inpatient rehabilitation. Will admit to inpatient rehab today.  Preadmission Screen Completed By:  Cleatrice Burke, 01/04/2018 1:01 PM ______________________________________________________________________   Discussed status with Dr. Letta Pate on 01/04/2018 at  1301 and received telephone approval for admission today.  Admission Coordinator:  Cleatrice Burke, time 1834 Date 01/04/2018       Cosigned by: Charlett Blake, MD at 01/04/2018 1:06 PM  Revision History

## 2018-01-04 NOTE — Progress Notes (Signed)
Meredith Staggers, MD  Physician  Physical Medicine and Rehabilitation  Consult Note  Signed  Date of Service:  01/02/2018 5:47 AM       Related encounter: ED to Hosp-Admission (Discharged) from 12/27/2017 in [facility]      Signed      Expand All Collapse All    Show:Clear all [x] Manual[x] Template[] Copied  Added by: [x] Angiulli, Lavon Paganini, PA-C[x] Meredith Staggers, MD  [] Hover for details      Physical Medicine and Rehabilitation Consult Reason for Consult: Decreased functional mobility Referring Physician: Internal medicine   HPI: Jay Chen is a 52 y.o. right-handed male with history of diastolic congestive heart failure, CKD stage III, history of thoracic aneurysm, diabetes mellitus, hypertension, hyperlipidemia, morbid obesity/OSA.  Presented 12/28/2017 right-sided flank pain and bilateral lower extremity weakness.  Per chart review patient lives with spouse and children ages 57 and 59.  Reported to be independent driving prior to admission.  2 level home with bedroom on [address] and 3 steps to entry.  Wife works during the day.  His mother can check on him.  Chest x-ray showed mild vascular congestion and borderline cardiomegaly.  CT renal stone study no acute abnormality seen.  There was a 2 mm nonobstructing stone at the lower pole of the right kidney.  Ultrasound aorta without abdominal aortic aneurysm noted.  MRI of the brain showed small acute posterior left temporal lobe infarction.  MRI lumbar thoracic cervical spine showed L4-5 broad-based disc bulge.  Moderate bilateral facet arthropathy with facet effusions.  L5-S1 with moderate right facet arthropathy.  MRI cervical spine with C4-5 severe cord compression due to disc protrusion with cord edema.  Echocardiogram with ejection fraction of 25%.  Systolic function severely reduced.  Close monitoring of renal function 2.31-2.72.  Cardiology services consulted in regards to CHF as well as persistent  atrial fibrillation and remains on Coreg as well as Lasix.  Xarelto was added for atrial fibrillation with cardiac rate controlled.  Neurology consulted and follow-up currently maintained on a prednisone Dosepak for cervical spinal cord compression with lumbar radiculopathy.  Therapy evaluation completed with recommendations of physical medicine rehab consult.   Review of Systems  Constitutional: Negative for chills.       Denies fever or chills  HENT: Negative for tinnitus.   Eyes: Positive for blurred vision.  Respiratory: Negative for cough.   Cardiovascular: Negative for chest pain.       No chest pain.  Increased shortness of breath with exertion.  Positive leg swelling  Gastrointestinal: Negative for nausea and vomiting.       Positive constipation.  Intermittent nausea.  Genitourinary: Negative for frequency.       Denies dysuria or hematuria.  Positive right flank pain  Musculoskeletal: Positive for back pain and neck pain.       Positive myalgias  Skin: Negative for rash.  Neurological: Positive for dizziness and focal weakness.  All other systems reviewed and are negative.      Past Medical History:  Diagnosis Date  . CKD (chronic kidney disease), stage III ([place])   . Congestive heart failure [facility]) May of 2011   Felt to have cor pulmonale; EF 45 to 50% from echo in May 2011  . Cor pulmonale (chronic) ([place])   . Gout    "take daily RX" (12/28/2017)  . Hyperlipidemia   . Hypertension   . Morbid obesity ([place])   . Persistent atrial fibrillation ([place])    [name] 12/28/2017  .  Sleep apnea    "dx'd; couldn't tolerate CPAP" (12/28/2017)  . Thoracic aneurysm    a. 4.8cm thoracic aortic aneurysm by CT 2013.  [name] Thoracic aortic aneurysm ([facility])    known/notes 12/28/2017  . Type II diabetes mellitus ([place])         Past Surgical History:  Procedure Laterality Date  . LAPAROSCOPIC CHOLECYSTECTOMY  2000  . US ECHOCARDIOGRAPHY  09/21/2009   EF 45-50%; Cavity size  was severely dilated, severe concentric hypertrophy and normal wall motion        Family History  Problem Relation Age of Onset  . Hypertension Mother   . Pancreatic cancer Father   . Prostate cancer Father   . Colon cancer Father    Social History:  reports that he has never smoked. He has never used smokeless tobacco. He reports that he drank alcohol. He reports that he does not use drugs. Allergies: No Known Allergies       Medications Prior to Admission  Medication Sig Dispense Refill  . albuterol (PROVENTIL HFA;VENTOLIN HFA) 108 (90 BASE) MCG/ACT inhaler Inhale 2 puffs into the lungs every 6 (six) hours as needed for wheezing or shortness of breath.    . allopurinol (ZYLOPRIM) 100 MG tablet Take 1 tablet (100 mg total) by mouth daily. 90 tablet 0  . atorvastatin (LIPITOR) 40 MG tablet Take 1 tablet (40 mg total) by mouth daily. 90 tablet 3  . carvedilol (COREG) 6.25 MG tablet Take 1 tablet (6.25 mg total) by mouth 2 (two) times daily. 180 tablet 3  . cloNIDine (CATAPRES) 0.1 MG tablet Take 1 tab in the am and 2 tab every pm. (Patient taking differently: Take 0.1-0.2 mg by mouth See admin instructions. Take 1 tablet every morning and take 2 tablets every evening) 270 tablet 0  . dapagliflozin propanediol (FARXIGA) 5 MG TABS tablet Take 5 mg by mouth daily. 90 tablet 1  . furosemide (LASIX) 40 MG tablet Take 1 tablet (40 mg total) by mouth daily. 90 tablet 1  . magnesium oxide (MAG-OX) 400 MG tablet Take 1 tablet (400 mg total) by mouth daily. 90 tablet 1  . metFORMIN (GLUCOPHAGE) 1000 MG tablet Take 1 tablet (1,000 mg total) by mouth 2 (two) times daily with a meal. 180 tablet 1  . omega-3 acid ethyl esters (LOVAZA) 1 G capsule Take 2 capsules (2 g total) by mouth 2 (two) times daily. 120 capsule 11  . potassium chloride SA (KLOR-CON M20) 20 MEQ tablet Take 2 tablets (40 mEq total) by mouth daily. 180 tablet 1  . rivaroxaban (XARELTO) 20 MG TABS tablet Take 1 tablet (20 mg  total) by mouth daily with supper. 90 tablet 1  . sacubitril-valsartan (ENTRESTO) 24-26 MG Take 1 tablet by mouth 2 (two) times daily. 60 tablet 5  . Exenatide ER (BYDUREON BCISE) 2 MG/0.85ML AUIJ Inject 1 Act into the skin once a week. (Patient not taking: Reported on 12/28/2017) 12 pen 1  . glucose blood (BAYER CONTOUR NEXT TEST) test strip Use BID 100 each 12  . Lancets Misc. (UNISTIK 1) MISC Test up to twice daily 50 each 11    Home: Home Living Family/patient expects to be discharged to:: Private residence Living Arrangements: Spouse/significant other, Children Available Help at Discharge: Family, Available PRN/intermittently Type of Home: House Home Access: Stairs to enter Technical brewer of Steps: 3 Entrance Stairs-Rails: None Home Layout: Two level, Bed/bath upstairs, Able to live on main level with bedroom/bathroom Alternate Level Stairs-Number of Steps: 20 Alternate  Level Stairs-Rails: Left Bathroom Shower/Tub: Tub/shower unit, Industrial/product designer: Yes  Functional History: Prior Function Level of Independence: Independent Comments: drives Functional Status:  Mobility: Bed Mobility Overal bed mobility: Needs Assistance Bed Mobility: Rolling Rolling: Max assist, +2 for physical assistance General bed mobility comments: maxAx2 for rolling to provide pericare and change linens  ADL:  Cognition: Cognition Overall Cognitive Status: Within Functional Limits for tasks assessed Orientation Level: Oriented X4 Cognition Arousal/Alertness: Awake/alert Behavior During Therapy: WFL for tasks assessed/performed Overall Cognitive Status: Within Functional Limits for tasks assessed General Comments: unaware that he was soaked in urine  Blood pressure (!) 121/98, pulse 97, temperature 98.3 F (36.8 C), temperature source Oral, resp. rate 14, height 5\' 8"  (1.727 m), weight (!) 157.6 kg, SpO2 93 %. Physical Exam  Constitutional: No  distress.  52 year old right-handed obese male  HENT:  Head: Normocephalic.  Eyes: Pupils are equal, round, and reactive to light.  Neck: Normal range of motion.  Cardiovascular: Normal rate.  Cardiac rate controlled  Respiratory: Effort normal.  Fair inspiratory effort.  Clear to auscultation  GI: Soft.  Musculoskeletal:  2+ LE edema  Neurological:  Alert.  No acute distress.  Follows full commands.  Oriented to person place and time. UE 4/5 prox to distal. LLE 2-/5 prox to 2/5 distal. RLE 2/5-3/5 prox to distal. Senses pain and LT in all 4.   Skin: He is not diaphoretic.  Psychiatric: He has a normal mood and affect. His behavior is normal. Judgment and thought content normal.    LabResultsLast24Hours       Results for orders placed or performed during the hospital encounter of 12/27/17 (from the past 24 hour(s))  Glucose, capillary     Status: Abnormal   Collection Time: 01/01/18  9:21 AM  Result Value Ref Range   Glucose-Capillary 112 (H) 70 - 99 mg/dL  Glucose, capillary     Status: Abnormal   Collection Time: 01/01/18 12:43 PM  Result Value Ref Range   Glucose-Capillary 149 (H) 70 - 99 mg/dL  Glucose, capillary     Status: Abnormal   Collection Time: 01/01/18  5:08 PM  Result Value Ref Range   Glucose-Capillary 229 (H) 70 - 99 mg/dL  Glucose, capillary     Status: Abnormal   Collection Time: 01/01/18  9:47 PM  Result Value Ref Range   Glucose-Capillary 178 (H) 70 - 99 mg/dL   Comment 1 Notify RN    Comment 2 Document in Chart       ImagingResults(Last48hours)  Mr Jodene Nam Head Wo Contrast  Result Date: 01/01/2018 CLINICAL DATA:  Stroke follow-up EXAM: MRA HEAD WITHOUT CONTRAST TECHNIQUE: Angiographic images of the Circle of Willis were obtained using MRA technique without intravenous contrast. COMPARISON:  Brain MRI yesterday FINDINGS: Symmetric carotid and vertebral arteries. Vessels are smooth and widely patent. Negative for aneurysm.  IMPRESSION: Negative intracranial MRA. Electronically Signed   By: Monte Fantasia M.D.   On: 01/01/2018 08:32   Mr Brain Wo Contrast  Result Date: 12/31/2017 CLINICAL DATA:  Generalized weakness. EXAM: MRI HEAD WITHOUT CONTRAST TECHNIQUE: Multiplanar, multiecho pulse sequences of the brain and surrounding structures were obtained without intravenous contrast. COMPARISON:  Head CT 02/08/2015 FINDINGS: Brain: There is a small acute white matter infarct in the posterosuperior left temporal lobe adjacent to the lateral ventricle measuring 1.2 cm in maximal dimension. Multiple chronic microhemorrhages are present in both cerebral hemispheres as well as in the right thalamus and right lentiform nucleus. Small foci  of T2 hyperintensity in the cerebral white matter bilaterally are nonspecific but compatible with mild chronic small vessel ischemic disease. The ventricles and sulci are normal for age. No mass, midline shift, or extra-axial fluid collection is seen. Vascular: Major intracranial vascular flow voids are preserved. Skull and upper cervical spine: Unremarkable bone marrow signal. Sinuses/Orbits: Unremarkable orbits. Right larger than left maxillary sinus mucous retention cysts. Clear mastoid air cells. Other: None. IMPRESSION: 1. Small acute posterior left temporal lobe infarct. 2. Mild chronic small vessel ischemic disease and scattered chronic microhemorrhages. Electronically Signed   By: Logan Bores M.D.   On: 12/31/2017 11:30   Mr Cervical Spine Wo Contrast  Result Date: 01/01/2018 CLINICAL DATA:  Painful myelopathy EXAM: MRI CERVICAL SPINE WITHOUT CONTRAST TECHNIQUE: Multiplanar, multisequence MR imaging of the cervical spine was performed. No intravenous contrast was administered. COMPARISON:  Cervical spine CT 09/27/2009 FINDINGS: Alignment: Reversal of cervical lordosis with 1-2 mm of C3-4 anterolisthesis. Vertebrae: No fracture, evidence of discitis, or bone lesion. Cord: T2 hyperintensity  about the compressed cord at C4-5. Posterior Fossa, vertebral arteries, paraspinal tissues: Trace prevertebral edema at C3 and C4, possibly reflecting recent injury. There is no indication of underlying bony infection or discitis. Disc levels: C2-3: Facet spurring.  Negative disc.  Foraminal narrowing is mild. C3-4: Facet spurring asymmetric to the left with mild anterolisthesis. Mild uncovertebral spurring. Foramina are patent. C4-5: Disc narrowing with central protrusion compressing the edematous cord. Cord compression is severe, with a 2 mm canal diameter in the midline. Disc height loss and uncovertebral spurring causes right more than left foraminal impingement C5-6: Disc narrowing and endplate degeneration with bulge compressing the cord. Bilateral foraminal impingement from disc height loss and uncovertebral spurring. C6-7: Disc narrowing and endplate degeneration with posterior disc osteophyte complex contacting the ventral cord. Mild bilateral foraminal narrowing C7-T1:Minor facet spurring.  Mild left foraminal stenosis. Case discussed with Dr. Erlinda Hong via telephone at time of signing IMPRESSION: 1. C4-5 severe cord compression due to disc protrusion, with cord edema. 2. C5-6 degenerative cord compression. 3. C6-7 mild degenerative spinal stenosis. 4. Slight prevertebral edema, question recent trauma. No evidence of discitis. 5. Bilateral foraminal impingement at C4-5 and C5-6. Electronically Signed   By: Monte Fantasia M.D.   On: 01/01/2018 08:07   Mr Thoracic Spine Wo Contrast  Result Date: 01/01/2018 CLINICAL DATA:  Acute or progressive myelopathy. Worsening weakness in both legs in severe pain. EXAM: MRI THORACIC SPINE WITHOUT CONTRAST TECHNIQUE: Multiplanar, multisequence MR imaging of the thoracic spine was performed. No intravenous contrast was administered. COMPARISON:  None. FINDINGS: Alignment:  Normal thoracic alignment. Counting sequence of the cervical spine shows C3-4 facet mediated  anterolisthesis and advanced disc degeneration at C4-5, C5-6, and C6-7. per the time line a cervical MRI has been ordered. Vertebrae: No fracture, evidence of discitis, or bone lesion. Cord:  Normal signal and morphology Paraspinal and other soft tissues: Intrinsic back muscle edema minimally seen in the lower thoracic spine, continuation of lumbar spine pathology as described previously. Disc levels: Generalized spondylosis. Disc height and hydration is well preserved. No notable facet arthropathy. Negative for cord impingement. There is symmetric diffuse foraminal patency. IMPRESSION: No acute finding.  No impingement or visible myelopathy. Electronically Signed   By: Monte Fantasia M.D.   On: 01/01/2018 07:49   Mr Lumbar Spine Wo Contrast  Result Date: 12/31/2017 CLINICAL DATA:  Right flank pain for several days EXAM: MRI LUMBAR SPINE WITHOUT CONTRAST TECHNIQUE: Multiplanar, multisequence MR imaging of the lumbar  spine was performed. No intravenous contrast was administered. COMPARISON:  None. FINDINGS: Segmentation:  Standard. Alignment:  Physiologic. Vertebrae:  No fracture, evidence of discitis, or bone lesion. Conus medullaris and cauda equina: Conus extends to the T12 level. Conus and cauda equina appear normal. Paraspinal and other soft tissues: No acute paraspinal abnormality. There is muscle edema in the multifidus muscle at the level of L4-5, left greater than right likely reflecting muscle strain versus an inflammatory etiology. Disc levels: Disc spaces: Disc spaces are maintained. T12-L1: No significant disc bulge. No evidence of neural foraminal stenosis. No central canal stenosis. L1-L2: No significant disc bulge. No evidence of neural foraminal stenosis. No central canal stenosis. L2-L3: No significant disc bulge. No evidence of neural foraminal stenosis. No central canal stenosis. L3-L4: Minimal broad-based disc bulge. No evidence of neural foraminal stenosis. No central canal stenosis.  L4-L5: Broad-based disc bulge. Moderate bilateral facet arthropathy with facet effusions. Moderate spinal stenosis and bilateral lateral recess stenosis. Moderate left foraminal stenosis. Mild right foraminal stenosis. L5-S1: No significant disc bulge. No central canal stenosis. Moderate right facet arthropathy. 9 mm right facet synovial cyst projecting into the right neural foramen and resulting in moderate foraminal stenosis. IMPRESSION: 1. At L4-5 there is a broad-based disc bulge. Moderate bilateral facet arthropathy with facet effusions. Moderate spinal stenosis and bilateral lateral recess stenosis. Moderate left foraminal stenosis. Mild right foraminal stenosis. 2. At L5-S1 there is moderate right facet arthropathy. 9 mm right facet synovial cyst projecting into the right neural foramen and resulting in moderate foraminal stenosis. 3. There is muscle edema in the multifidus muscle at the level of L4-5, left greater than right likely reflecting muscle strain versus an inflammatory etiology. Electronically Signed   By: Kathreen Devoid   On: 12/31/2017 12:34      Assessment/Plan: Diagnosis: cervical stenosis with cord compression, lumbar spondylosis with radiculopathy, left temporal lobe infarct 1. Does the need for close, 24 hr/day medical supervision in concert with the patient's rehab needs make it unreasonable for this patient to be served in a less intensive setting? Yes 2. Co-Morbidities requiring supervision/potential complications: morbid obesity, afib, chf, CM 3. Due to bladder management, bowel management, safety, skin/wound care, disease management, medication administration, pain management and patient education, does the patient require 24 hr/day rehab nursing? Yes 4. Does the patient require coordinated care of a physician, rehab nurse, PT (1-2 hrs/day, 5 days/week) and OT (1-2 hrs/day, 5 days/week) to address physical and functional deficits in the context of the above medical  diagnosis(es)? Yes Addressing deficits in the following areas: balance, endurance, locomotion, strength, transferring, bowel/bladder control, bathing, dressing, feeding, grooming, toileting, cognition and psychosocial support 5. Can the patient actively participate in an intensive therapy program of at least 3 hrs of therapy per day at least 5 days per week? Yes 6. The potential for patient to make measurable gains while on inpatient rehab is excellent 7. Anticipated functional outcomes upon discharge from inpatient rehab are supervision and min assist  with PT, supervision and min assist with OT, n/a with SLP. 8. Estimated rehab length of stay to reach the above functional goals is: 20-24 days 9. Anticipated D/C setting: Home 10. Anticipated post D/C treatments: HH therapy and Outpatient therapy 11. Overall Rehab/Functional Prognosis: excellent  RECOMMENDATIONS: This patient's condition is appropriate for continued rehabilitative care in the following setting: CIR Patient has agreed to participate in recommended program. Yes Note that insurance prior authorization may be required for reimbursement for recommended care.  Comment: Rehab  Admissions Coordinator to follow up.  Thanks,  Meredith Staggers, MD, Mellody Drown  I have personally performed a face to face diagnostic evaluation of this patient. Additionally, I have reviewed and concur with the physician assistant's documentation above.    Lavon Paganini Angiulli, PA-C 01/02/2018        Revision History                        Routing History

## 2018-01-04 NOTE — Progress Notes (Signed)
Patient arrived to unit via nurse and wheelchair. Patient on 4.5 liters of O2 when arrived orders to wean patient off oxygen. Transported patient to bed via stedy 2 plus assistance. Family at bedside. Report received from previous floor. No c/o pain at this time per patient. Patient now on 3.5 liters of o2. Sats 95%. Assessment done and no skin issues.

## 2018-01-04 NOTE — Progress Notes (Signed)
Patient transferred to 4W-01 via wheelchair. Patient A&O and stable, required 2-assist with transfer into wheelchair with stedy. Report called to [facility], LPN.

## 2018-01-05 ENCOUNTER — Inpatient Hospital Stay (HOSPITAL_COMMUNITY): Payer: BC Managed Care – PPO

## 2018-01-05 ENCOUNTER — Inpatient Hospital Stay (HOSPITAL_COMMUNITY): Payer: BC Managed Care – PPO | Admitting: Physical Therapy

## 2018-01-05 ENCOUNTER — Inpatient Hospital Stay (HOSPITAL_COMMUNITY): Payer: BC Managed Care – PPO | Admitting: Occupational Therapy

## 2018-01-05 DIAGNOSIS — M109 Gout, unspecified: Secondary | ICD-10-CM

## 2018-01-05 DIAGNOSIS — M5415 Radiculopathy, thoracolumbar region: Secondary | ICD-10-CM

## 2018-01-05 DIAGNOSIS — N183 Chronic kidney disease, stage 3 (moderate): Secondary | ICD-10-CM

## 2018-01-05 LAB — CBC WITH DIFFERENTIAL/PLATELET
Abs Immature Granulocytes: 0.1 10*3/uL (ref 0.0–0.1)
Basophils Absolute: 0.1 10*3/uL (ref 0.0–0.1)
Basophils Relative: 1 %
EOS ABS: 0.3 10*3/uL (ref 0.0–0.7)
EOS PCT: 3 %
HEMATOCRIT: 40.8 % (ref 39.0–52.0)
HEMOGLOBIN: 13.3 g/dL (ref 13.0–17.0)
Immature Granulocytes: 1 %
LYMPHS ABS: 1.6 10*3/uL (ref 0.7–4.0)
LYMPHS PCT: 16 %
MCH: 31 pg (ref 26.0–34.0)
MCHC: 32.6 g/dL (ref 30.0–36.0)
MCV: 95.1 fL (ref 78.0–100.0)
Monocytes Absolute: 1.2 10*3/uL — ABNORMAL HIGH (ref 0.1–1.0)
Monocytes Relative: 12 %
Neutro Abs: 7 10*3/uL (ref 1.7–7.7)
Neutrophils Relative %: 69 %
Platelets: 325 10*3/uL (ref 150–400)
RBC: 4.29 MIL/uL (ref 4.22–5.81)
RDW: 13.4 % (ref 11.5–15.5)
WBC: 10.1 10*3/uL (ref 4.0–10.5)

## 2018-01-05 LAB — COMPREHENSIVE METABOLIC PANEL
ALK PHOS: 110 U/L (ref 38–126)
ALT: 124 U/L — ABNORMAL HIGH (ref 0–44)
ANION GAP: 7 (ref 5–15)
AST: 69 U/L — ABNORMAL HIGH (ref 15–41)
Albumin: 2.5 g/dL — ABNORMAL LOW (ref 3.5–5.0)
BILIRUBIN TOTAL: 0.8 mg/dL (ref 0.3–1.2)
BUN: 59 mg/dL — ABNORMAL HIGH (ref 6–20)
CALCIUM: 9.1 mg/dL (ref 8.9–10.3)
CO2: 36 mmol/L — ABNORMAL HIGH (ref 22–32)
Chloride: 97 mmol/L — ABNORMAL LOW (ref 98–111)
Creatinine, Ser: 1.78 mg/dL — ABNORMAL HIGH (ref 0.61–1.24)
GFR calc non Af Amer: 42 mL/min — ABNORMAL LOW (ref >60)
GFR, EST AFRICAN AMERICAN: 49 mL/min — AB (ref >60)
GLUCOSE: 144 mg/dL — AB (ref 70–99)
Potassium: 3.9 mmol/L (ref 3.5–5.1)
Sodium: 140 mmol/L (ref 135–145)
TOTAL PROTEIN: 6.1 g/dL — AB (ref 6.5–8.1)

## 2018-01-05 LAB — GLUCOSE, CAPILLARY
GLUCOSE-CAPILLARY: 144 mg/dL — AB (ref 70–99)
GLUCOSE-CAPILLARY: 120 mg/dL — AB (ref 70–99)
GLUCOSE-CAPILLARY: 139 mg/dL — AB (ref 70–99)
Glucose-Capillary: 135 mg/dL — ABNORMAL HIGH (ref 70–99)

## 2018-01-05 MED ORDER — PREDNISONE 5 MG PO TABS
10.0000 mg | ORAL_TABLET | Freq: Two times a day (BID) | ORAL | Status: DC
  Administered 2018-01-05 – 2018-01-06 (×3): 10 mg via ORAL
  Filled 2018-01-05 (×3): qty 2

## 2018-01-05 NOTE — Evaluation (Signed)
Occupational Therapy Assessment and Plan  Patient Details  Name: Jay Chen MRN: [id number] Date of Birth: July 15, 1965  OT Diagnosis: acute pain and muscle weakness (generalized) Rehab Potential: Rehab Potential (ACUTE ONLY): Good ELOS: 21-24 days   Today's Date: 01/05/2018 OT Individual Time: 730-830 am OT Individual Time Calculation (min): 60 min    Problem List:  Patient Active Problem List   Diagnosis Date Noted  . Radiculopathy 01/04/2018  . Debility   . Atrial fibrillation, chronic ([place])   . Cerebral embolism with cerebral infarction 01/01/2018  . Acute decompensated heart failure ([place])   . Lumbar radiculopathy   . Cervical spinal cord compression ([place])   . PNA (pneumonia) 12/30/2017  . DCM (dilated cardiomyopathy) ([place])   . Atrial fibrillation with rapid ventricular response ([place])   . Chronic combined systolic and diastolic heart failure ([place])   . AKI (acute kidney injury) ([place]) 12/28/2017  . Flank pain   . Chronic combined systolic (congestive) and diastolic (congestive) heart failure ([place])   . Chronic systolic heart failure ([place]) 11/22/2017  . Bilateral lower extremity edema 10/21/2017  . Pure hyperglyceridemia 09/01/2017  . Persistent atrial fibrillation ([place]) 09/01/2017  . Phimosis/redundant prepuce 06/03/2015  . Hypokalemia 07/18/2013  . Gout of big toe 09/11/2012  . Hyperlipidemia with target LDL less than 100 09/11/2012  . Diabetes mellitus type 2, controlled, with complications ([place]) 63/87/5643  . Routine general medical examination at a health care facility 01/19/2012  . Cor pulmonale ([place]) 01/08/2011  . HTN (hypertension) 01/08/2011  . Obesities, morbid ([place]) 01/08/2011  . Aneurysm of thoracic aorta ([place]) 01/08/2011  . Obstructive sleep apnea 08/27/2007  . RHINITIS 08/18/2007    Past Medical History:  Past Medical History:  Diagnosis Date  . CKD (chronic kidney disease), stage III ([place])   . Congestive heart failure [facility]) May of 2011   Felt to  have cor pulmonale; EF 45 to 50% from echo in May 2011  . Cor pulmonale (chronic) ([place])   . Gout    "take daily RX" (12/28/2017)  . Hyperlipidemia   . Hypertension   . Morbid obesity ([place])   . Persistent atrial fibrillation ([place])    [name] 12/28/2017  . Sleep apnea    "dx'd; couldn't tolerate CPAP" (12/28/2017)  . Thoracic aneurysm    a. 4.8cm thoracic aortic aneurysm by CT 2013.  [name] Thoracic aortic aneurysm ([place])    known/notes 12/28/2017  . Type II diabetes mellitus ([place])    Past Surgical History:  Past Surgical History:  Procedure Laterality Date  . LAPAROSCOPIC CHOLECYSTECTOMY  2000  . US ECHOCARDIOGRAPHY  09/21/2009   EF 45-50%; Cavity size was severely dilated, severe concentric hypertrophy and normal wall motion    Assessment & Plan Clinical Impression: Jay Harte Burnetteis a 52 year old right-handed male with history of diastolic/systolic congestive heart failure, CKD stage III, history of thoracic aneurysm, diabetes mellitus, hypertension, hyperlipidemia, morbid obesity/OSA. Presented 12/28/2017 with right side flank pain and bilateral lower extremity weakness. Per chart review patient lives with spouse and children ages 81 and 16. Reported to be independent driving prior to admission and currently unemployed. Two-level home with bedroom on Main level and 3 steps to entry. Wife works during the day. Mother can check on him as needed. Chest x-ray showed mild vascular congestion and borderline cardiomegaly as well as suspected opacity at the left lung base could not exclude pneumonia and was placed on Rocephin as well as doxycycline. There was a 2 mm nonobstructing stone at the lower pole  of the right kidney. Ultrasound aorta without abdominal aortic aneurysm noted. MRI of the brain showed small acute posterior temporal lobe infarction. MRI lumbar thoracic cervical spine showed L4-5 broad-based disc bulge. Moderate bilateral facet arthropathy with facet effusions. L5-S1 with moderate  right facet arthropathy. MRI cervical spine with C4-5 severe cord compression due to disc protrusion with cord edema. Echocardiogram with ejection fraction 25%. Systolic function severely reduced. Patient transferred to CIR on 01/04/2018 .    Patient currently requires max with basic self-care skills secondary to muscle weakness, decreased cardiorespiratoy endurance and decreased standing balance, decreased postural control and decreased balance strategies.  Prior to hospitalization, patient could complete ADLs/IADLs with modified independent .  Patient will benefit from skilled intervention to decrease level of assist with basic self-care skills and increase level of independence with iADL prior to discharge home with care partner.  Anticipate patient will require intermittent supervision and follow up home health.  OT - End of Session Activity Tolerance: Tolerates 10 - 20 min activity with multiple rests Endurance Deficit: Yes Endurance Deficit Description: generalized weakness OT Assessment Rehab Potential (ACUTE ONLY): Good OT Patient demonstrates impairments in the following area(s): Balance;Pain;Safety;Endurance;Motor OT Basic ADL's Functional Problem(s): Grooming;Bathing;Dressing;Toileting OT Advanced ADL's Functional Problem(s): Simple Meal Preparation OT Transfers Functional Problem(s): Toilet;Tub/Shower OT Additional Impairment(s): None OT Plan OT Intensity: Minimum of 1-2 x/day, 45 to 90 minutes OT Frequency: 5 out of 7 days OT Duration/Estimated Length of Stay: 2-3 weeks OT Treatment/Interventions: Balance/vestibular training;Community reintegration;Disease mangement/prevention;Patient/family education;Self Care/advanced ADL retraining;Therapeutic Exercise;UE/LE Coordination activities;Discharge planning;DME/adaptive equipment instruction;Functional mobility training;Psychosocial support;Pain management;Therapeutic Activities;UE/LE Strength taining/ROM OT Self Feeding Anticipated  Outcome(s): mod I OT Basic Self-Care Anticipated Outcome(s): (S) OT Toileting Anticipated Outcome(s): (S) OT Bathroom Transfers Anticipated Outcome(s): (S) OT Recommendation Recommendations for Other Services: Therapeutic Recreation consult Therapeutic Recreation Interventions: [place] group Patient destination: Home Follow Up Recommendations: Home health OT Equipment Recommended: To be determined   Skilled Therapeutic Intervention Pt seen for skilled OT evaluation. Pt edu re OT POC, rehab expectations, ELOS, goal planning, and condition insight. Pt completed UB/LB bathing sitting EOB with min A provided for distal LE thoroughness, and total A of peri cleansing in standing. Pt required max +2 A to stand from elevated EOB with RW. Edu provided re RW management. Teds donned total A. Pt required several rest breaks during session. Pt declined stand pivot transfer d/t fatigue and elected to stay sitting EOB. Bed alarm set and pt edu re importance of calling for help prior to getting up. Pt left sitting EOB with all needs met.   OT Evaluation Precautions/Restrictions  Precautions Precautions: Fall Restrictions Weight Bearing Restrictions: No General Chart Reviewed: Yes Family/Caregiver Present: No Vital Signs Therapy Vitals Pulse Rate: 68 BP: 106/76 Oxygen Therapy SpO2: 93 % O2 Device: Nasal Cannula O2 Flow Rate (L/min): 3.5 L/min Pain Pain Assessment Pain Scale: 0-10 Pain Score: 4  Pain Type: Acute pain Pain Location: Foot Pain Orientation: Left Pain Descriptors / Indicators: Aching Pain Onset: On-going Pain Intervention(s): Repositioned Home Living/Prior Functioning Home Living Family/patient expects to be discharged to:: Private residence Living Arrangements: Spouse/significant other Available Help at Discharge: Available PRN/intermittently, Other (Comment)(wife works full time, 2 teenage children) Type of Home: House Home Access: Stairs to enter Technical brewer  of Steps: 3 Entrance Stairs-Rails: None Home Layout: Two level, Bed/bath upstairs, Able to live on main level with bedroom/bathroom Alternate Level Stairs-Number of Steps: 20 Alternate Level Stairs-Rails: Left Bathroom Shower/Tub: Tub/shower unit, Architectural technologist: Standard Bathroom Accessibility: Yes Additional Comments: 2  kids ages 54 and 33  Lives With: Spouse, Family IADL History Homemaking Responsibilities: Yes Meal Prep Responsibility: Primary Laundry Responsibility: Secondary Cleaning Responsibility: Secondary Bill Paying/Finance Responsibility: Secondary Shopping Responsibility: Secondary Child Care Responsibility: Secondary Homemaking Comments: enjoys cooking dinner Current License: Yes Mode of Transportation: Musician Occupation: Retired Prior Function Level of Independence: Independent with basic ADLs, Independent with homemaking with ambulation  Able to Take Stairs?: Yes Driving: Yes Vocation: Retired Comments: drives, enjoys cooking dinner for family ADL ADL ADL Comments: Please see functional navigator Vision Baseline Vision/History: No visual deficits Patient Visual Report: No change from baseline Vision Assessment?: No apparent visual deficits Perception  Perception: Within Functional Limits Praxis Praxis: Intact Cognition Overall Cognitive Status: Within Functional Limits for tasks assessed Arousal/Alertness: Awake/alert Orientation Level: Person;Place;Situation Person: Oriented Place: Oriented Situation: Oriented Year: 2019 Month: August Day of Week: Correct Memory: Impaired Memory Impairment: Decreased short term memory Decreased Short Term Memory: Functional basic Immediate Memory Recall: Sock;Blue;Bed Memory Recall: Sock;Blue;Bed Memory Recall Sock: Without Cue Memory Recall Blue: Without Cue Memory Recall Bed: With Cue Attention: Selective Selective Attention: Appears intact Awareness: Appears intact Problem Solving: Appears  intact Safety/Judgment: Appears intact Sensation Sensation Light Touch: Appears Intact Coordination Gross Motor Movements are Fluid and Coordinated: No Fine Motor Movements are Fluid and Coordinated: No Coordination and Movement Description: Limited by generalized weakness Motor  Motor Motor: Other (comment) Motor - Skilled Clinical Observations: generalized weakness Mobility  Bed Mobility Bed Mobility: Rolling Right;Supine to Sit;Rolling Left Rolling Right: Contact Guard/Touching assist Rolling Left: Contact Guard/Touching assist Supine to Sit: Contact Guard/Touching assist Transfers Sit to Stand: Maximal Assistance - Patient 25-49%;2 Helpers Stand to Sit: 2 Helpers;Maximal Assistance - Patient 25-49%  Trunk/Postural Assessment  Cervical Assessment Cervical Assessment: Exceptions to WFL(forward head) Thoracic Assessment Thoracic Assessment: Exceptions to WFL(rounded shoulders) Lumbar Assessment Lumbar Assessment: Exceptions to WFL(posterior pelvic tilt) Postural Control Postural Control: Deficits on evaluation Righting Reactions: limited Protective Responses: limited  Balance Balance Balance Assessed: Yes Static Sitting Balance Static Sitting - Balance Support: Feet unsupported;No upper extremity supported Static Sitting - Level of Assistance: 5: Stand by assistance Dynamic Sitting Balance Dynamic Sitting - Balance Support: Feet supported;During functional activity;No upper extremity supported Dynamic Sitting - Level of Assistance: 5: Stand by assistance Dynamic Sitting - Balance Activities: Reaching for objects Static Standing Balance Static Standing - Balance Support: During functional activity;Bilateral upper extremity supported Static Standing - Level of Assistance: 2: Max assist Static Standing - Comment/# of Minutes: 1 min Dynamic Standing Balance Dynamic Standing - Balance Support: Bilateral upper extremity supported;During functional activity Dynamic Standing  - Level of Assistance: 1: +2 Total assist Dynamic Standing - Comments: during LB clothing management Extremity/Trunk Assessment RUE Assessment RUE Assessment: Exceptions to [facility] General Strength Comments: generalized weakness, 4/5 LUE Assessment LUE Assessment: Exceptions to [facility] General Strength Comments: generalized weakness, 4/5   See Function Navigator for Current Functional Status.   Refer to Care Plan for Long Term Goals  Recommendations for other services: Therapeutic Recreation  Kitchen group   Discharge Criteria: Patient will be discharged from OT if patient refuses treatment 3 consecutive times without medical reason, if treatment goals not met, if there is a change in medical status, if patient makes no progress towards goals or if patient is discharged from hospital.  The above assessment, treatment plan, treatment alternatives and goals were discussed and mutually agreed upon: by patient  Curtis Sites 01/05/2018, 12:30 PM

## 2018-01-05 NOTE — Progress Notes (Signed)
[facility]     PROGRESS NOTE    Subjective/Complaints: Pt state she had a fairly good night. Having pain in left first toe, worse with weight bearing and with pressure upon the toe  ROS: Patient denies fever, rash, sore throat, blurred vision, nausea, vomiting, diarrhea, cough, shortness of breath or chest pain, joint or back pain, headache, or mood change.   Objective:  Dg Chest 2 View  Result Date: 01/03/2018 CLINICAL DATA:  Follow-up LEFT LOWER LOBE atelectasis and/or pneumonia and mild CHF. EXAM: CHEST - 2 VIEW COMPARISON:  12/29/2017, 12/28/2017 and CT chest 03/28/2012 and earlier. FINDINGS: AP SEMI-ERECT and LATERAL images were obtained. Cardiac silhouette markedly enlarged, increased in size since 2013. Hilar and mediastinal contours otherwise unremarkable. Interval resolution of the interstitial pulmonary edema since the examination 5 days ago. Improved aeration in the LEFT LOWER LOBE without significant residual airspace consolidation. No new pulmonary parenchymal abnormalities. No pleural effusions. Degenerative changes involving the thoracic and UPPER lumbar spine. IMPRESSION: 1. Marked cardiomegaly.  No acute cardiopulmonary disease currently. 2. Interval resolution of interstitial pulmonary edema and LEFT LOWER LOBE atelectasis and/or pneumonia since the examination 5 days ago. Electronically Signed   By: Evangeline Dakin M.D.   On: 01/03/2018 13:08   Recent Labs    01/05/18 0530  WBC 10.1  HGB 13.3  HCT 40.8  PLT 325   Recent Labs    01/04/18 0420 01/05/18 0530  NA 140 140  K 4.0 3.9  CL 96* 97*  GLUCOSE 130* 144*  BUN 67* 59*  CREATININE 1.85* 1.78*  CALCIUM 9.3 9.1   CBG (last 3)  Recent Labs    01/04/18 1701 01/04/18 2126 01/05/18 0641  GLUCAP 130* 156* 144*    Wt Readings from Last 3 Encounters:  01/04/18 (!) 155.7 kg  11/22/17 (!) 164.7 kg  11/07/17 (!) 176.7 kg     Intake/Output Summary (Last 24 hours) at  01/05/2018 0943 Last data filed at 01/05/2018 0730 Gross per 24 hour  Intake -  Output 1150 ml  Net -1150 ml    Vital Signs: Blood pressure 104/90, pulse 70, temperature 98.3 F (36.8 C), temperature source Oral, resp. rate 17, SpO2 98 %. Physical Exam:  Constitutional: No distress . Vital signs reviewed. obese HEENT: EOMI, oral membranes moist Neck: supple Cardiovascular: IRR without murmur. No JVD    Respiratory: CTA Bilaterally without wheezes or rales. Normal effort    GI: BS +, non-tender, non-distended   Neurological: He isalertand oriented to person, place, and time. Follows full commands. Fair awareness of deficits Skin: Skin iswarmand dry. Motor strength is 4-/5 bilateral hip flexor, 4/5 knee extensor ankle dorsiflexor 5/5 bilateral deltoid bicep tricep grip Sensation intact to light touch bilateral upper and lower limb--motor and sensory stable Musc: left great toe tender with palpation along medial MTP, some swelling Psych: pleasant and cooperative  Assessment/Plan: 1. Functional deficits secondary to cervical cord compression, lumbar stenosis/radic, left temporal lobe infarct which require 3+ hours per day of interdisciplinary therapy in a comprehensive inpatient rehab setting. Physiatrist is providing close team supervision and 24 hour management of active medical problems listed below. Physiatrist and rehab team continue to assess barriers to discharge/monitor patient progress toward functional and medical goals.  Function:  Bathing Bathing position      Bathing parts      Bathing assist        Upper Body Dressing/Undressing Upper body dressing  Upper body assist        Lower Body Dressing/Undressing Lower body dressing                                  Lower body assist        Toileting Toileting       Toileting Assistive Devices: Other (comment)(Stedy 2+)  Toileting assist Assist level: Two  helpers   Transfers Chair/bed Physiological scientist Comprehension Comprehension assist level: Understands basic 90% of the time/cues < 10% of the time  Expression Expression assist level: Expresses basic 90% of the time/requires cueing < 10% of the time.  Social Interaction Social Interaction assist level: Interacts appropriately with others - No medications needed.  Problem Solving Problem solving assist level: Solves basic 90% of the time/requires cueing < 10% of the time  Memory     Medical Problem List and Plan: 1.Decreased functional mobilitysecondary to cervical stenosis with cord compression, lumbar spondylosis with radiculopathy, left temporal lobe infarction.Patient is to follow-up outpatient with Dr. Kathyrn Sheriff to discuss possible surgical interventions for lumbar radiculopathy  -beginning therapies today 2. DVT Prophylaxis/Anticoagulation: Xarelto. 3. Pain Management:Robaxin 500 mg 3 times daily, oxycodone as needed 4. Mood:Provide emotional support 5. Neuropsych: This patientiscapable of making decisions on hisown behalf. 6. Skin/Wound Care:Routine skin checks 7. Fluids/Electrolytes/Nutrition:[place] PO  -I personally reviewed the patient's labs today.  No acute changes 8.Persistent atrial fibrillation. Cardiac rate controlled. Amiodarone 200 mg twice daily,Toprol 100mg  daily 9.Chronic systolic and diastolic congestive heart failure. Monitor for any signs of fluid overload. Lasix 40 mg Daily  -There were no vitals filed for this visit.  -needs daily weights 10.LLLpneumonia. Follow-up chest x-ray 01/03/2018 with no acute process. Complete course of Omnicef and doxycycline 11. Diabetes mellitus with peripheral neuropathy. Hemoglobin A1c 8.4. SSI. Check blood sugars before meals and at bedtime. Patient on Glucophage 1000 mg twice daily prior to admission currently on hold  due to renal insufficiency  -reasonable control at present 8/15 12.CKD stage III. Follow-up chemistries as above 13.Hyperlipidemia. Lovaza/Lipitor 14.Morbid obesity. BMI 51.99. Dietary follow-up 15.Constipation. Laxative assistance 16. Gout left great toe  -options limited given renal/DM  -will give short course of prednisone  -push fluids, dietary considerations discussed   LOS (Days) 1 A FACE TO FACE EVALUATION WAS PERFORMED  Meredith Staggers, MD 01/05/2018 9:43 AM

## 2018-01-05 NOTE — Evaluation (Signed)
Physical Therapy Assessment and Plan  Patient Details  Name: Jay Chen MRN: [id number] Date of Birth: 04/04/1966  PT Diagnosis: Abnormal posture, Abnormality of gait, Cognitive deficits, Coordination disorder, Muscle weakness and Pain in L foot  Rehab Potential: Good ELOS: 18-21 days    Today's Date: 01/05/2018 PT Individual Time: 8016-5537 PT Individual Time Calculation (min): 60 min    Problem List:  Patient Active Problem List   Diagnosis Date Noted  . Radiculopathy 01/04/2018  . Debility   . Atrial fibrillation, chronic ([place])   . Cerebral embolism with cerebral infarction 01/01/2018  . Acute decompensated heart failure ([place])   . Lumbar radiculopathy   . Cervical spinal cord compression ([place])   . PNA (pneumonia) 12/30/2017  . DCM (dilated cardiomyopathy) ([place])   . Atrial fibrillation with rapid ventricular response ([place])   . Chronic combined systolic and diastolic heart failure ([place])   . AKI (acute kidney injury) ([place]) 12/28/2017  . Flank pain   . Chronic combined systolic (congestive) and diastolic (congestive) heart failure ([place])   . Chronic systolic heart failure ([place]) 11/22/2017  . Bilateral lower extremity edema 10/21/2017  . Pure hyperglyceridemia 09/01/2017  . Persistent atrial fibrillation ([place]) 09/01/2017  . Phimosis/redundant prepuce 06/03/2015  . Hypokalemia 07/18/2013  . Gout of big toe 09/11/2012  . Hyperlipidemia with target LDL less than 100 09/11/2012  . Diabetes mellitus type 2, controlled, with complications ([place]) 48/27/0786  . Routine general medical examination at a health care facility 01/19/2012  . Cor pulmonale ([place]) 01/08/2011  . HTN (hypertension) 01/08/2011  . Obesities, morbid ([place]) 01/08/2011  . Aneurysm of thoracic aorta ([place]) 01/08/2011  . Obstructive sleep apnea 08/27/2007  . RHINITIS 08/18/2007    Past Medical History:  Past Medical History:  Diagnosis Date  . CKD (chronic kidney disease), stage III ([place])   . Congestive heart  failure [facility]) May of 2011   Felt to have cor pulmonale; EF 45 to 50% from echo in May 2011  . Cor pulmonale (chronic) ([place])   . Gout    "take daily RX" (12/28/2017)  . Hyperlipidemia   . Hypertension   . Morbid obesity ([place])   . Persistent atrial fibrillation ([place])    [name] 12/28/2017  . Sleep apnea    "dx'd; couldn't tolerate CPAP" (12/28/2017)  . Thoracic aneurysm    a. 4.8cm thoracic aortic aneurysm by CT 2013.  [name] Thoracic aortic aneurysm ([place])    known/notes 12/28/2017  . Type II diabetes mellitus ([place])    Past Surgical History:  Past Surgical History:  Procedure Laterality Date  . LAPAROSCOPIC CHOLECYSTECTOMY  2000  . US ECHOCARDIOGRAPHY  09/21/2009   EF 45-50%; Cavity size was severely dilated, severe concentric hypertrophy and normal wall motion    Assessment & Plan Clinical Impression:  Jay Chen a 52 year old right-handed male with history of diastolic/systolic congestive heart failure, CKD stage III, history of thoracic aneurysm, diabetes mellitus, hypertension, hyperlipidemia, morbid obesity/OSA. Presented 12/28/2017 with right side flank pain and bilateral lower extremity weakness. Per chart review patient lives with spouse and children ages 21 and 24. Reported to be independent driving prior to admission and currently unemployed. Two-level home with bedroom on Main level and 3 steps to entry. Wife works during the day. Mother can check on him as needed. Chest x-ray showed mild vascular congestion and borderline cardiomegaly as well as suspected opacity at the left lung base could not exclude pneumonia and was placed on Rocephin as well as doxycycline. CT renal stone  study no acute abnormality seen. There was a 2 mm nonobstructing stone at the lower pole of the right kidney. Ultrasound aorta without abdominal aortic aneurysm noted. MRI of the brain showed small acute posterior temporal lobe infarction. MRI lumbar thoracic cervical spine showed L4-5 broad-based disc  bulge. Moderate bilateral facet arthropathy with facet effusions. L5-S1 with moderate right facet arthropathy. MRI cervical spine with C4-5 severe cord compression due to disc protrusion with cord edema. Echocardiogram with ejection fraction 25%. Systolic function severely reduced. Carotid Dopplers with no ICA stenosis. Close monitoring of renal function 2.31-2.72. Cardiology services consulted in regards to CHF as well as persistent atrial fibrillation remained on Coreg as well as Lasix and Lopressor. Xarelto was added for atrial fibrillation with cardiac rate controlled.There was question of left lower lobe pneumonia per chest x-ray with follow-up chest x-ray completed after broad-spectrum antibiotics currently completing course of Omnicef and doxycycline. Neurology consulted and follow-upinitiallymaintained on prednisone Dosepak for severe spinal cord compression with lumbar radiculopathy and no plan for surgical intervention at this time per Dr. Elizebeth Brooking plan to address as outpatient. Physical and occupational therapy evaluations completed with recommendations of physical medicine rehab consult. Patient was admitted for a comprehensive rehab program. Patient transferred to CIR on 01/04/2018 . Information taken from medical chart.   Patient currently requires max with mobility secondary to muscle weakness, decreased cardiorespiratoy endurance, decreased awareness and decreased problem solving and decreased sitting balance, decreased standing balance, decreased postural control and decreased balance strategies.  Prior to hospitalization, patient was independent  with mobility and lived with Spouse, Family in a House home.  Home access is 3Stairs to enter(Pt notes garage access has 3 steps and B HR's).  Patient will benefit from skilled PT intervention to maximize safe functional mobility, minimize fall risk and decrease caregiver burden for planned discharge home with intermittent assist.   Anticipate patient will benefit from follow up [facility] at discharge.  PT - End of Session Activity Tolerance: Tolerates 30+ min activity with multiple rests Endurance Deficit: Yes Endurance Deficit Description: generalized weakness PT Assessment Rehab Potential (ACUTE/IP ONLY): Good PT Patient demonstrates impairments in the following area(s): Balance;Endurance;Motor;Pain;Safety PT Transfers Functional Problem(s): Bed Mobility;Bed to Chair;Car;Furniture PT Locomotion Functional Problem(s): Ambulation;Wheelchair Mobility;Stairs PT Plan PT Intensity: Minimum of 1-2 x/day ,45 to 90 minutes PT Frequency: 5 out of 7 days PT Duration Estimated Length of Stay: 18-21 days  PT Treatment/Interventions: Ambulation/gait training;Pain management;Stair training;Balance/vestibular training;DME/adaptive equipment instruction;Patient/family education;Therapeutic Activities;Wheelchair propulsion/positioning;Functional electrical stimulation;Therapeutic Exercise;Psychosocial support;Community reintegration;Functional mobility training;UE/LE Strength taining/ROM;Discharge planning;Neuromuscular re-education;UE/LE Coordination activities PT Transfers Anticipated Outcome(s): supervision with LRAD  PT Locomotion Anticipated Outcome(s): supervision with LRAD  PT Recommendation Recommendations for Other Services: Speech consult Follow Up Recommendations: Home health PT Patient destination: Home Equipment Recommended: To be determined  Skilled Therapeutic Intervention Pt reports 2/10 L foot pain prior to session start indicating pain worsens in WB positions and therapist monitored pain throughout session. Pt indicates he is wet 2/2 urine due to inability to use urinal with 100% accuracy. Therapist provided education to call for help if accident were to occur again for hygiene cleansing to prevent skin break down. Pt performs hook lying bridging to doff pants and briefs with min A to loop pants through legs. Pt performs  supine>sit EOB with min A at trunk. Pt performs x2 STS from bed>stedy with max A plus one to allow donning of new brief and gown. Pt transferred from bed>w/c using stedy. Pt able to propel w/c from room>gym for 215 feet using  B UE's with supervision. Pt performs x1 STS from w/c> // bars with 2 helper assist and multimodal cues for proper sequencing and weight shifting. Pt able to tolerate <1 min in standing 2/2 L foot pain and requests return to sitting position. Pt propelled w/c from gym>room with supervision and left in sitting with tray table and call bell in reach and all needs met.   PT Evaluation Precautions/Restrictions Precautions Precautions: Fall Restrictions Weight Bearing Restrictions: No Pain Pain Assessment Pain Scale: 0-10 Pain Score: 2  Pain Type: Acute pain Pain Location: Foot Pain Orientation: Left Pain Descriptors / Indicators: Aching;Sharp(hurts more in WB) Pain Onset: On-going Patients Stated Pain Goal: 0 Pain Intervention(s): Rest;Other (Comment)(tailored session to Pt tolerance) Multiple Pain Sites: No Home Living/Prior Functioning Home Living Available Help at Discharge: Available PRN/intermittently;Other (Comment)(mother can help prn) Type of Home: House Home Access: Stairs to enter(Pt notes garage access has 3 steps and B HR's) Entrance Stairs-Number of Steps: 3 Entrance Stairs-Rails: None Home Layout: Two level;Bed/bath upstairs;Able to live on main level with bedroom/bathroom Alternate Level Stairs-Number of Steps: 20 Alternate Level Stairs-Rails: Left Additional Comments: 2 kids ages 24 and 65 and wife goes to school during day  Lives With: Spouse;Family Prior Function Level of Independence: Independent with gait;Independent with transfers  Able to Take Stairs?: Yes Driving: Yes Vocation: Unemployed Leisure: Hobbies-yes (Comment) Comments: takes care of the house and enjoys driving kids to activities  Vision/Perception  Perception Perception:  Within Functional Limits Praxis Praxis: Intact  Cognition Overall Cognitive Status: Within Functional Limits for tasks assessed Arousal/Alertness: Awake/alert Orientation Level: Oriented X4 Awareness: Appears intact Problem Solving: Impaired Problem Solving Impairment: Verbal complex;Functional complex Safety/Judgment: Appears intact Sensation Sensation Light Touch: Appears Intact Coordination Gross Motor Movements are Fluid and Coordinated: No Coordination and Movement Description: Limited by generalized weakness Heel Shin Test: R=L Pt not able to perform 2/2 dec hip ER  Motor  Motor Motor: Other (comment) Motor - Skilled Clinical Observations: generalized weakness limiting functional mobility   Mobility Bed Mobility Bed Mobility: Rolling Right;Supine to Sit;Rolling Left Rolling Right: Minimal Assistance - Patient > 75% Rolling Left: Minimal Assistance - Patient > 75% Supine to Sit: Minimal Assistance - Patient > 75% Transfers Transfers: Sit to Stand;Stand to Sit Sit to Stand: Maximal Assistance - Patient 25-49% Stand to Sit: Maximal Assistance - Patient 25-49% Transfer (Assistive device): Other (Comment) Transfer via Lift Equipment: Animal nutritionist: No Gait Gait: No Stairs / Additional Locomotion Stairs: No Ramp: Other (comment)(did not assess) Curb: Other (comment)(did not assess) Architect: Yes Wheelchair Assistance: Chartered loss adjuster: Both upper extremities Wheelchair Parts Management: Needs assistance Distance: 215  Trunk/Postural Assessment  Cervical Assessment Cervical Assessment: Exceptions to WFL(forward head) Thoracic Assessment Thoracic Assessment: Exceptions to WFL(rounded shoulders) Lumbar Assessment Lumbar Assessment: Exceptions to WFL(posterior pelvic tilt) Postural Control Postural Control: Deficits on evaluation Righting Reactions: delayed Protective Responses:  delayed; unable to tolerate external challenge  Balance Balance Balance Assessed: Yes Static Sitting Balance Static Sitting - Balance Support: Feet supported;No upper extremity supported Static Sitting - Level of Assistance: 5: Stand by assistance Dynamic Sitting Balance Dynamic Sitting - Balance Support: Feet supported;No upper extremity supported Dynamic Sitting - Level of Assistance: 5: Stand by assistance Static Standing Balance Static Standing - Balance Support: Bilateral upper extremity supported Static Standing - Level of Assistance: 2: Max assist Static Standing - Comment/# of Minutes: can tolerate <1 min before requesting seated rest break due to L foot pain Extremity Assessment  RUE Assessment RUE Assessment: Not tested LUE Assessment LUE Assessment: Not tested RLE Assessment RLE Assessment: Exceptions to [facility] Active Range of Motion (AROM) Comments: WFL with exception to hip ER ROM limited  General Strength Comments: generalized weakness; 4/5 overall with hip flexors being 4-/5 LLE Assessment LLE Assessment: Exceptions to [facility] Active Range of Motion (AROM) Comments: WFL with exception to hip ER ROM limited General Strength Comments: generalized weakness; 4/5 overall with hip flexors being 4-/5   See Function Navigator for Current Functional Status.   Refer to Care Plan for Long Term Goals  Recommendations for other services: None   Discharge Criteria: Patient will be discharged from PT if patient refuses treatment 3 consecutive times without medical reason, if treatment goals not met, if there is a change in medical status, if patient makes no progress towards goals or if patient is discharged from hospital.  The above assessment, treatment plan, treatment alternatives and goals were discussed and mutually agreed upon: by patient  Floreen Comber 01/05/2018, 4:44 PM

## 2018-01-05 NOTE — Progress Notes (Signed)
Occupational Therapy Session Note  Patient Details  Name: Jay Chen MRN: [id number] Date of Birth: 11-13-1965  Today's Date: 01/05/2018 OT Individual Time: 0945-1100 OT Individual Time Calculation (min): 75 min    Short Term Goals: Week 1:  OT Short Term Goal 1 (Week 1): Pt will perform sit <> stand with max +1 OT Short Term Goal 2 (Week 1): Pt will thread LB clothing over BLE with (S) OT Short Term Goal 3 (Week 1): Pt will perform 1 grooming task in standing OT Short Term Goal 4 (Week 1): Pt will perform stand pivot transfer to w/c with max A  Skilled Therapeutic Interventions/Progress Updates:   Pt received sitting EOB and agreeable to trying sit to stands with RW.  Shoes donned and +2 A provided from tech.  Attempted 3x to stand from EOB with max A of 2 and pt not able to lift hips and adequately push through his legs.  Used the bariatric stedy from EOB with mod A of 2 to stand.  Once pt was positioned on the pads he worked on partial stand to full stand using heavy reliance on his arms but with no physical A needed as he also had support of knee pads.  Pt able to repeat motion 5 x2 sets and on last stand he held his stand for 20-30 seconds.   O2 95% with 2L of O2 support. Pt positioned in wc with leg rests. Completed grooming at sink independently in w/c. Pt worked on activity tolerance with BUE strengthening using 3# dowel bar for 10 reps each of chest press, shoulder press, rowing forward and back for 3 sets. Pt requested to return to bed as his next therapy was not until 1415.  Nurse tech provided +2 A with stedy to transfer back to bed.   Pt in bed with all needs met. Ted hose doffed as pt will need XL and XL size.   Therapy Documentation Precautions:  Precautions Precautions: Fall Restrictions Weight Bearing Restrictions: No General: General Chart Reviewed: Yes Family/Caregiver Present: No Vital Signs: Therapy Vitals Pulse Rate: 68 BP: 106/76 Oxygen Therapy SpO2:  93 % O2 Device: Nasal Cannula O2 Flow Rate (L/min): 3.5 L/min Pain: Pain Assessment Pain Scale: 0-10 Pain Score: 4  Pain Type: Acute pain Pain Location: Foot Pain Orientation: Left Pain Descriptors / Indicators: Aching Pain Onset: On-going Pain Intervention(s): Repositioned ADL: ADL ADL Comments: Please see functional navigator Vision Baseline Vision/History: No visual deficits Patient Visual Report: No change from baseline Vision Assessment?: No apparent visual deficits Perception  Perception: Within Functional Limits Praxis Praxis: Intact  See Function Navigator for Current Functional Status.   Therapy/Group: Individual Therapy  [place] 01/05/2018, 12:26 PM

## 2018-01-06 ENCOUNTER — Inpatient Hospital Stay (HOSPITAL_COMMUNITY): Payer: BC Managed Care – PPO

## 2018-01-06 ENCOUNTER — Inpatient Hospital Stay (HOSPITAL_COMMUNITY): Payer: BC Managed Care – PPO | Admitting: Occupational Therapy

## 2018-01-06 ENCOUNTER — Inpatient Hospital Stay (HOSPITAL_COMMUNITY): Payer: BC Managed Care – PPO | Admitting: Physical Therapy

## 2018-01-06 LAB — GLUCOSE, CAPILLARY
Glucose-Capillary: 169 mg/dL — ABNORMAL HIGH (ref 70–99)
Glucose-Capillary: 166 mg/dL — ABNORMAL HIGH (ref 70–99)
Glucose-Capillary: 167 mg/dL — ABNORMAL HIGH (ref 70–99)
Glucose-Capillary: 152 mg/dL — ABNORMAL HIGH (ref 70–99)

## 2018-01-06 MED ORDER — PREDNISONE 5 MG PO TABS
10.0000 mg | ORAL_TABLET | Freq: Every day | ORAL | Status: AC
  Administered 2018-01-07 – 2018-01-08 (×2): 10 mg via ORAL
  Filled 2018-01-06 (×2): qty 2

## 2018-01-06 NOTE — Progress Notes (Signed)
Physical Therapy Session Note  Patient Details  Name: Jay Chen MRN: [id number] Date of Birth: 1966/01/23  Today's Date: 01/06/2018 PT Individual Time: 1400-1500 PT Individual Time Calculation (min): 60 min   Short Term Goals: Week 1:  PT Short Term Goal 1 (Week 1): Pt will perform all bed mobility with CGA  PT Short Term Goal 2 (Week 1): Pt will perform STS with mod A and LRAD  PT Short Term Goal 3 (Week 1): Pt will perform transfers with mod A and LRAD  PT Short Term Goal 4 (Week 1): Pt will ambulate 15 feet with LRAD and assist   Skilled Therapeutic Interventions/Progress Updates:    pt with no reports of pain during session.  Session focus on activity tolerance via pre-gait, gait, and therex.  Pt on O2 today reporting that sats had dropped.  Monitored throughout session and maintained at >90% on 2L, but dropped to 83% with sit<>stand on room air.    Sit<>stand from recliner into stedy with mod assist to boost from lower surface.  Stedy to transfer to w/c for safety.  Sit<>stand from w/c x3 with min assist to maintain forward weight shift and min verbal cues for hand placement on armrests.  Initiated pre-gait with R/L weight shifts and marching in place in standing trials with RW, pt reporting sensation of L knee buckling, with mild buckle noted by therapist.  Gait training with Rw x15' with min assist overall, w/c follow for safety.  PT provided verbal cues for use of UEs to reduce burden on LLE during stance phase if pt felt it to be unsteady.  PT instructed pt in BLE therex with 3# ankle weights for muscle endurance 2x12 reps of LAQ and hip flexion.  Returned to room at end of session, stand/pivot back to recliner with RW and min assist for balance.  Positioned to comfort with call bell in reach and needs met.   Therapy Documentation Precautions:  Precautions Precautions: Fall Restrictions Weight Bearing Restrictions: No   See Function Navigator for Current Functional  Status.   Therapy/Group: Individual Therapy  [name] 01/06/2018, 4:32 PM

## 2018-01-06 NOTE — Progress Notes (Signed)
Occupational Therapy Session Note  Patient Details  Name: Jay Chen MRN: [id number] Date of Birth: [birth year]  Today's Date: 01/06/2018 OT Individual Time: 1000-1055 OT Individual Time Calculation (min): 55 min    Short Term Goals: Week 1:  OT Short Term Goal 1 (Week 1): Pt will perform sit <> stand with max +1 OT Short Term Goal 2 (Week 1): Pt will thread LB clothing over BLE with (S) OT Short Term Goal 3 (Week 1): Pt will perform 1 grooming task in standing OT Short Term Goal 4 (Week 1): Pt will perform stand pivot transfer to w/c with max A  Skilled Therapeutic Interventions/Progress Updates:    Pt resting in recliner upon arrival and agreeable to therapy.  OT intervention with focus on sit<>stand, static standing balance, and BUE therex.  Pt performed sit<>stand X 5 starting with min A and progressing to supervision.  Standing length of time progressed from 10 seconds to 60 seconds.  Pt also engaged in BUE therex with 3# weight bar 3 sets X 10 of rowing.  Pt issued green theraband and educated on BUE therex to perform throughout day.  Pt remained in recliner with all needs within reach.   Therapy Documentation Precautions:  Precautions Precautions: Fall Restrictions Weight Bearing Restrictions: No  Pain:  Pt denies pain  See Function Navigator for Current Functional Status.   Therapy/Group: Individual Therapy  Leroy Libman 01/06/2018, 10:59 AM

## 2018-01-06 NOTE — Progress Notes (Addendum)
Occupational Therapy Session Note  Patient Details  Name: Jay Chen MRN: [id number] Date of Birth: May 18, 1966  Today's Date: 01/06/2018 OT Individual Time: 1031-5945 OT Individual Time Calculation (min): 70 min   Short Term Goals: Week 1:  OT Short Term Goal 1 (Week 1): Pt will perform sit <> stand with max +1 OT Short Term Goal 2 (Week 1): Pt will thread LB clothing over BLE with (S) OT Short Term Goal 3 (Week 1): Pt will perform 1 grooming task in standing OT Short Term Goal 4 (Week 1): Pt will perform stand pivot transfer to w/c with max A  Skilled Therapeutic Interventions/Progress Updates:    Pt greeted supine in bed with c/o pain in feet. RN notified and in during tx to provide pain medicine. He opted to complete B/D EOB vs shower. With assist for achieving and maintaining figure 4, he was able to wash feet and assist with footwear today. Also able to complete 3/3 components of donning pants with balance assist of 1 when standing with RW. He completed stand pivot<recliner with Mod A and RW. Oral care/grooming tasks completed w/c level with increased times and min vcs for awareness. Total A for Teds. He was repositioned for comfort in recliner with LEs elevated. Pt left with all needs within reach at session exit.    02 sats wavered from high 80s to low/high 90s during tx without supplemental 02. Pt reported no SOB. Per consultation with RN, nasal cannula reapplied before departure due to low sats last night.   Therapy Documentation Precautions:  Precautions Precautions: Fall Restrictions Weight Bearing Restrictions: No Vital Signs: Therapy Vitals Temp: 98 F (36.7 C) Temp Source: Oral Pulse Rate: (!) 116 Resp: 17 BP: (!) 140/97 Patient Position (if appropriate): Lying Oxygen Therapy SpO2: 92 % O2 Device: Room Air Pain: Addressed above    ADL: ADL ADL Comments: Please see functional navigator     See Function Navigator for Current Functional  Status.   Therapy/Group: Individual Therapy  Taniyah Ballow A Ruby Logiudice 01/06/2018, 9:13 AM

## 2018-01-06 NOTE — Discharge Instructions (Addendum)
Inpatient Rehab Discharge Instructions  Jay Chen Discharge date and time: No discharge date for patient encounter.   Activities/Precautions/ Functional Status: Activity: activity as tolerated Diet: regular diet Wound Care: none needed Functional status:  ___ No restrictions     ___ Walk up steps independently ___ 24/7 supervision/assistance   ___ Walk up steps with assistance ___ Intermittent supervision/assistance  ___ Bathe/dress independently ___ Walk with walker     _x__ Bathe/dress with assistance ___ Walk Independently    ___ Shower independently ___ Walk with assistance    ___ Shower with assistance ___ No alcohol     ___ Return to work/school ________  COMMUNITY REFERRALS UPON DISCHARGE:   Home Health:   PT     OT  Agency:  [facility] Phone:  [phone] Medical Equipment/Items Ordered:  Wide rollator; bariatric 3-in-1; bariatric tub bench  Agency/Supplier:  [facility]        Phone:  [phone]  Special Instructions:  No driving [place] Cigarette smoking nearly doubles your risk of having a stroke & is the single most alterable risk factor  If you smoke or have smoked in the last 12 months, you are advised to quit smoking for your health.  Most of the excess cardiovascular risk related to smoking disappears within a year of stopping.  Ask you doctor about anti-smoking medications  [facility] Quit Line: 1-800-QUIT NOW  Free Smoking Cessation Classes [phone]  CHOLESTEROL Know your levels; limit fat & cholesterol in your diet  Lipid Panel     Component Value Date/Time   CHOL 116 01/01/2018 0159   TRIG 85 01/01/2018 0159   HDL 24 (L) 01/01/2018 0159   CHOLHDL 4.8 01/01/2018 0159   VLDL 17 01/01/2018 0159   LDLCALC 75 01/01/2018 0159      Many patients benefit from treatment even if their cholesterol is at goal.  Goal: Total Cholesterol (CHOL) less than 160  Goal:  Triglycerides (TRIG) less than  150  Goal:  HDL greater than 40  Goal:  LDL (LDLCALC) less than 100   BLOOD PRESSURE American Stroke Association blood pressure target is less that 120/80 mm/Hg  Your discharge blood pressure is:  BP: 107/81  Monitor your blood pressure  Limit your salt and alcohol intake  Many individuals will require more than one medication for high blood pressure  DIABETES (A1c is a blood sugar average for last 3 months) Goal HGBA1c is under 7% (HBGA1c is blood sugar average for last 3 months)  Diabetes:   Lab Results  Component Value Date   HGBA1C 7.8 (H) 01/01/2018     Your HGBA1c can be lowered with medications, healthy diet, and exercise.  Check your blood sugar as directed by your physician  Call your physician if you experience unexplained or low blood sugars.  PHYSICAL ACTIVITY/REHABILITATION Goal is 30 minutes at least 4 days per week  Activity: Increase activity slowly, Therapies: Physical Therapy: Home Health Return to work:   Activity decreases your risk of heart attack and stroke and makes your heart stronger.  It helps control your weight and blood pressure; helps you relax and can improve your mood.  Participate in a regular exercise program.  Talk with your doctor about the best form of exercise for you (dancing, walking, swimming, cycling).  DIET/WEIGHT Goal is to maintain a healthy weight  Your discharge diet is:  Diet Order            Diet heart  healthy/carb modified Room service appropriate? Yes; Fluid consistency: Thin  Diet effective now              liquids Your height is:    Your current weight is:   Your Body Mass Index (BMI) is:     Following the type of diet specifically designed for you will help prevent another stroke.  Your goal weight range is:    Your goal Body Mass Index (BMI) is 19-24.  Healthy food habits can help reduce 3 risk factors for stroke:  High cholesterol, hypertension, and excess weight.  RESOURCES Stroke/Support Group:  Call  [phone]   STROKE EDUCATION PROVIDED/REVIEWED AND GIVEN TO PATIENT Stroke warning signs and symptoms How to activate emergency medical system (call 911). Medications prescribed at discharge. Need for follow-up after discharge. Personal risk factors for stroke. Pneumonia vaccine given:  Flu vaccine given:  My questions have been answered, the writing is legible, and I understand these instructions.  I will adhere to these goals & educational materials that have been provided to me after my discharge from the hospital.     My questions have been answered and I understand these instructions. I will adhere to these goals and the provided educational materials after my discharge from the hospital.  Patient/Caregiver Signature _______________________________ Date __________  Clinician Signature _______________________________________ Date __________  Please bring this form and your medication list with you to all your follow-up doctor's appointments.     Information on my medicine - XARELTO (Rivaroxaban)  Why was Xarelto prescribed for you? Xarelto was prescribed for you to reduce the risk of a blood clot forming that can cause a stroke if you have a medical condition called atrial fibrillation (a type of irregular heartbeat).  What do you need to know about xarelto ? Take your Xarelto ONCE DAILY at the same time every day with your evening meal. If you have difficulty swallowing the tablet whole, you may crush it and mix in applesauce just prior to taking your dose.  Take Xarelto exactly as prescribed by your doctor and DO NOT stop taking Xarelto without talking to the doctor who prescribed the medication.  Stopping without other stroke prevention medication to take the place of Xarelto may increase your risk of developing a clot that causes a stroke.  Refill your prescription before you run out.  After discharge, you should have regular check-up appointments with your  healthcare provider that is prescribing your Xarelto.  In the future your dose may need to be changed if your kidney function or weight changes by a significant amount.  What do you do if you miss a dose? If you are taking Xarelto ONCE DAILY and you miss a dose, take it as soon as you remember on the same day then continue your regularly scheduled once daily regimen the next day. Do not take two doses of Xarelto at the same time or on the same day.   Important Safety Information A possible side effect of Xarelto is bleeding. You should call your healthcare provider right away if you experience any of the following: ? Bleeding from an injury or your nose that does not stop. ? Unusual colored urine (red or dark brown) or unusual colored stools (red or black). ? Unusual bruising for unknown reasons. ? A serious fall or if you hit your head (even if there is no bleeding).  Some medicines may interact with Xarelto and might increase your risk of bleeding while on Xarelto. To help avoid  this, consult your healthcare provider or pharmacist prior to using any new prescription or non-prescription medications, including herbals, vitamins, non-steroidal anti-inflammatory drugs (NSAIDs) and supplements.  This website has more information on Xarelto: [email or web address]

## 2018-01-06 NOTE — Progress Notes (Signed)
[facility] PHYSICAL MEDICINE & REHABILITATION     PROGRESS NOTE    Subjective/Complaints: Pt up EOB. Left great toe feeling better today.   ROS: Patient denies fever, rash, sore throat, blurred vision, nausea, vomiting, diarrhea, cough, shortness of breath or chest pain,  back pain, headache, or mood change.    Objective:  No results found. Recent Labs    01/05/18 0530  WBC 10.1  HGB 13.3  HCT 40.8  PLT 325   Recent Labs    01/04/18 0420 01/05/18 0530  NA 140 140  K 4.0 3.9  CL 96* 97*  GLUCOSE 130* 144*  BUN 67* 59*  CREATININE 1.85* 1.78*  CALCIUM 9.3 9.1   CBG (last 3)  Recent Labs    01/05/18 1642 01/05/18 2146 01/06/18 0644  GLUCAP 139* 135* 169*    Wt Readings from Last 3 Encounters:  01/06/18 (!) 159 kg  01/04/18 (!) 155.7 kg  11/22/17 (!) 164.7 kg     Intake/Output Summary (Last 24 hours) at 01/06/2018 0959 Last data filed at 01/06/2018 0818 Gross per 24 hour  Intake 720 ml  Output 875 ml  Net -155 ml    Vital Signs: Blood pressure (!) 140/97, pulse (!) 116, temperature 98 F (36.7 C), temperature source Oral, resp. rate 17, weight (!) 159 kg, SpO2 92 %. Physical Exam:  Constitutional: No distress . Vital signs reviewed. HEENT: EOMI, oral membranes moist Neck: supple Cardiovascular: RRR without murmur. No JVD    Respiratory: CTA Bilaterally without wheezes or rales. Normal effort    GI: BS +, non-tender, non-distended  Neurological: He isalertand oriented to person, place, and time. Follows full commands. Fair awareness of deficits Skin: Skin iswarmand dry. Motor strength is 4-/5 bilateral hip flexor, 4/5 knee extensor ankle dorsiflexor 5/5 bilateral deltoid bicep tricep grip Sensation intact to light touch bilateral upper and lower limb--motor and sensory stable Musc: left great toe tender with palpation along medial MTP, some swelling Psych: pleasant   Assessment/Plan: 1. Functional deficits secondary to cervical cord  compression, lumbar stenosis/radic, left temporal lobe infarct which require 3+ hours per day of interdisciplinary therapy in a comprehensive inpatient rehab setting. Physiatrist is providing close team supervision and 24 hour management of active medical problems listed below. Physiatrist and rehab team continue to assess barriers to discharge/monitor patient progress toward functional and medical goals.  Function:  Bathing Bathing position   Position: Sitting EOB  Bathing parts Body parts bathed by patient: Right arm, Left arm, Chest, Front perineal area, Abdomen, Right upper leg, Left upper leg, Right lower leg, Left lower leg Body parts bathed by helper: Buttocks  Bathing assist Assist Level: 2 helpers(in standing)      Upper Body Dressing/Undressing Upper body dressing   What is the patient wearing?: Pull over shirt/dress     Pull over shirt/dress - Perfomed by patient: Thread/unthread right sleeve, Put head through opening, Thread/unthread left sleeve, Pull shirt over trunk          Upper body assist Assist Level: Set up   Set up : To obtain clothing/put away  Lower Body Dressing/Undressing Lower body dressing   What is the patient wearing?: Underwear, Pants Underwear - Performed by patient: Thread/unthread left underwear leg Underwear - Performed by helper: Thread/unthread right underwear leg, Pull underwear up/down Pants- Performed by patient: Thread/unthread left pants leg Pants- Performed by helper: Thread/unthread right pants leg  Lower body assist Assist for lower body dressing: 2 Helpers      Toileting Toileting Toileting activity did not occur: No continent bowel/bladder event     Toileting Assistive Devices: Other (comment)(Stedy 2+)  Toileting assist Assist level: Two helpers   Transfers Chair/bed transfer Chair/bed transfer activity did not occur: Safety/medical concerns           Locomotion Ambulation Ambulation  activity did not occur: Safety/medical concerns         Wheelchair   Type: Manual Max wheelchair distance: 215 Assist Level: Supervision or verbal cues  Cognition Comprehension Comprehension assist level: Understands basic 90% of the time/cues < 10% of the time  Expression Expression assist level: Expresses basic 90% of the time/requires cueing < 10% of the time.  Social Interaction Social Interaction assist level: Interacts appropriately with others - No medications needed.  Problem Solving Problem solving assist level: Solves basic 90% of the time/requires cueing < 10% of the time  Memory Memory assist level: Recognizes or recalls 90% of the time/requires cueing < 10% of the time   Medical Problem List and Plan: 1.Decreased functional mobilitysecondary to cervical stenosis with cord compression, lumbar spondylosis with radiculopathy, left temporal lobe infarction.Patient is to follow-up outpatient with Dr. Kathyrn Sheriff to discuss possible surgical interventions for lumbar radiculopathy  -beginning therapies today 2. DVT Prophylaxis/Anticoagulation: Xarelto. 3. Pain Management:Robaxin 500 mg 3 times daily, oxycodone as needed 4. Mood:Provide emotional support 5. Neuropsych: This patientiscapable of making decisions on hisown behalf. 6. Skin/Wound Care:Routine skin checks 7. Fluids/Electrolytes/Nutrition:Rencourage PO 8.Persistent atrial fibrillation. Cardiac rate controlled. Amiodarone 200 mg twice daily,Toprol 100mg  daily 9.Chronic systolic and diastolic congestive heart failure. Monitor for any signs of fluid overload. Lasix 40 mg Daily  - Filed Weights   01/05/18 1655 01/06/18 0655  Weight: (!) 156.8 kg (!) 159 kg    -follow weight trend 10.LLLpneumonia. Follow-up chest x-ray 01/03/2018 with no acute process. Complete course of Omnicef and doxycycline 11. Diabetes mellitus with peripheral neuropathy. Hemoglobin A1c 8.4. SSI. Check blood sugars before  meals and at bedtime. Patient on Glucophage 1000 mg twice daily prior to admission currently on hold due to renal insufficiency  -reasonable control at present 8/16 12.CKD stage III. recheck labs next week 13.Hyperlipidemia. Lovaza/Lipitor 14.Morbid obesity. BMI 51.99. Dietary follow-up 15.Constipation. Laxative assistance 16. Gout left great toe: improved today  -options limited given renal/DM  -continue prednisone 10mg , taper quickly off  -push fluids, dietary considerations discussed   LOS (Days) 2 A FACE TO FACE EVALUATION WAS PERFORMED  Meredith Staggers, MD 01/06/2018 9:59 AM

## 2018-01-06 NOTE — Progress Notes (Signed)
Inpatient Rehabilitation Center Individual Statement of Services  Patient Name:  Jay Chen  Date:  01/06/2018  Welcome to the [facility].  Our goal is to provide you with an individualized program based on your diagnosis and situation, designed to meet your specific needs.  With this comprehensive rehabilitation program, you will be expected to participate in at least 3 hours of rehabilitation therapies Monday-Friday, with modified therapy programming on the weekends.  Your rehabilitation program will include the following services:  Physical Therapy (PT), Occupational Therapy (OT), 24 hour per day rehabilitation nursing, Neuropsychology, Case Management (Social Worker), Rehabilitation Medicine, Nutrition Services and Pharmacy Services  Weekly team conferences will be held on Tuesdays to discuss your progress.  Your Social Worker will talk with you frequently to get your input and to update you on team discussions.  Team conferences with you and your family in attendance may also be held.  Expected length of stay:  18 to 24 days  Overall anticipated outcome:  Supervision  Depending on your progress and recovery, your program may change. Your Social Worker will coordinate services and will keep you informed of any changes. Your Social Worker's name and contact numbers are listed  below.  The following services may also be recommended but are not provided by the [facility] will be made to provide these services after discharge if needed.  Arrangements include referral to agencies that provide these services.  Your insurance has been verified to be:  [employer] Your primary doctor is:  Dr. Scarlette Calico  Pertinent information will be shared with your doctor and your insurance  company.  Social Worker:  Alfonse Alpers, LCSW  [phone] or [phone]  Information discussed with and copy given to patient by: Trey Sailors, 01/06/2018, 12:34 AM

## 2018-01-06 NOTE — Progress Notes (Signed)
Social Work Assessment and Plan  Patient Details  Name: Jay Chen MRN: [id number] Date of Birth: 1965-07-01  Today's Date: 01/05/2018  Problem List:  Patient Active Problem List   Diagnosis Date Noted  . Radiculopathy 01/04/2018  . Debility   . Atrial fibrillation, chronic ([place])   . Cerebral embolism with cerebral infarction 01/01/2018  . Acute decompensated heart failure ([place])   . Lumbar radiculopathy   . Cervical spinal cord compression ([place])   . PNA (pneumonia) 12/30/2017  . DCM (dilated cardiomyopathy) ([place])   . Atrial fibrillation with rapid ventricular response ([place])   . Chronic combined systolic and diastolic heart failure ([place])   . AKI (acute kidney injury) ([place]) 12/28/2017  . Flank pain   . Chronic combined systolic (congestive) and diastolic (congestive) heart failure ([place])   . Chronic systolic heart failure ([place]) 11/22/2017  . Bilateral lower extremity edema 10/21/2017  . Pure hyperglyceridemia 09/01/2017  . Persistent atrial fibrillation ([place]) 09/01/2017  . Phimosis/redundant prepuce 06/03/2015  . Hypokalemia 07/18/2013  . Gout of big toe 09/11/2012  . Hyperlipidemia with target LDL less than 100 09/11/2012  . Diabetes mellitus type 2, controlled, with complications ([place]) 80/99/8338  . Routine general medical examination at a health care facility 01/19/2012  . Cor pulmonale ([place]) 01/08/2011  . HTN (hypertension) 01/08/2011  . Obesities, morbid ([place]) 01/08/2011  . Aneurysm of thoracic aorta ([place]) 01/08/2011  . Obstructive sleep apnea 08/27/2007  . RHINITIS 08/18/2007   Past Medical History:  Past Medical History:  Diagnosis Date  . CKD (chronic kidney disease), stage III ([place])   . Congestive heart failure [facility]) May of 2011   Felt to have cor pulmonale; EF 45 to 50% from echo in May 2011  . Cor pulmonale (chronic) ([place])   . Gout    "take daily RX" (12/28/2017)  . Hyperlipidemia   . Hypertension   . Morbid obesity ([place])   . Persistent atrial  fibrillation ([place])    [name] 12/28/2017  . Sleep apnea    "dx'd; couldn't tolerate CPAP" (12/28/2017)  . Thoracic aneurysm    a. 4.8cm thoracic aortic aneurysm by CT 2013.  [name] Thoracic aortic aneurysm ([place])    known/notes 12/28/2017  . Type II diabetes mellitus ([place])    Past Surgical History:  Past Surgical History:  Procedure Laterality Date  . LAPAROSCOPIC CHOLECYSTECTOMY  2000  . US ECHOCARDIOGRAPHY  09/21/2009   EF 45-50%; Cavity size was severely dilated, severe concentric hypertrophy and normal wall motion   Social History:  reports that he has never smoked. He has never used smokeless tobacco. He reports that he drank alcohol. He reports that he does not use drugs.  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   Abuse/Neglect Abuse/Neglect Assessment Can Be Completed: Yes Physical Abuse: Denies Verbal Abuse: Denies Sexual Abuse: Denies Exploitation of patient/patient's resources: Denies Self-Neglect: Denies  Emotional Status Pt's affect, behavior and adjustment status: Pt is motivated to work on [employer]  He does not report any feelings of depression or anxiety. Recent Psychosocial Issues: Pt was not working prior to admission.  He is  in IT and would like to work again. Psychiatric History: none reported Substance Abuse History: none reported  Patient / Family Perceptions, Expectations & Goals Pt/Family understanding of illness & functional limitations: Pt has a good understanding of his condition and limitations.  He wife was not present at visit, so will assess this with her in the future. Premorbid pt/family roles/activities: Pt was independent and driving PTA. Anticipated changes in roles/activities/participation: Pt would like to resume activities as he is able. Pt/family expectations/goals: Pt wants to regain his strength and be able to care for himself.  Community [employer] Agencies: None Premorbid Home Care/DME Agencies: Other (Comment)(Records show that pt has a rolling walker and BSC, but he denies any DME at home.) Transportation available at discharge: family Resource referrals recommended: Neuropsychology, Support group (specify)  Discharge Planning Living Arrangements: Spouse/significant other Support Systems: Spouse/significant other, Children, Parent, Other relatives, Friends/neighbors Type of Residence: Private residence Insurance Resources: Multimedia programmer (specify)([employer]) Museum/gallery curator Resources: Family Support Financial Screen Referred: No Money Management: Patient, Spouse Does the patient have any problems obtaining your medications?: No Home Management: Pt and wife shared household responsibilities. Patient/Family Preliminary Plans: Pt to return to his home where he can stay on the main level with a bedroom and bathroom. Social Work Anticipated Follow Up Needs: HH/OP, Support Group Expected length of stay: ELOS 18 to 24 days  Clinical Impression CSW met with pt to introduce self and role of CSW, as well as to complete assessment.  Pt's therapist came in to work with pt, so our time was cut short.  Pt plans to return to his home at d/c with his wife to be with him  at night and his mother during the day.  Pt was active and independent PTA and would like to get back to that and back to work.  Pt reported feeling good emotionally and is ready to work hard with therapists.  CSW will meet wife in pt's room or call her.  No current concerns/needs/questions.  CSW will continue to follow and assist as needed.  Roselinda Bahena, Silvestre Mesi 01/06/2018, 12:47 AM

## 2018-01-06 NOTE — IPOC Note (Signed)
Overall Plan of Care [facility]) Patient Details Name: Jay Chen MRN: [id number] DOB: 1965-07-19  Admitting Diagnosis: <principal problem not specified>  Hospital Problems: Active Problems:   Debility   Atrial fibrillation, chronic (HCC)   Radiculopathy     Functional Problem List: Nursing Bladder, Bowel, Pain, Skin Integrity  PT Balance, Endurance, Motor, Pain, Safety  OT Balance, Pain, Safety, Endurance, Motor  SLP    TR         Basic ADL's: OT Grooming, Bathing, Dressing, Toileting     Advanced  ADL's: OT Simple Meal Preparation     Transfers: PT Bed Mobility, Bed to Chair, Car, Manufacturing systems engineer, Metallurgist: PT Ambulation, Emergency planning/management officer, Stairs     Additional Impairments: OT None  SLP        TR      Anticipated Outcomes Item Anticipated Outcome  Self Feeding mod I  Swallowing      Basic self-care  (S)  Toileting  (S)   Bathroom Transfers (S)  Bowel/Bladder  Pt will manage bowel and bladder with min assist at discharge   Transfers  supervision with LRAD   Locomotion  supervision with LRAD   Communication     Cognition     Pain  Pt will manage pain at 4 or less on a scale of 0-10 while in rehab.   Safety/Judgment  Pt will remain free of falls and injury while in rehab with min assist/cues.    Therapy Plan: PT Intensity: Minimum of 1-2 x/day ,45 to 90 minutes PT Frequency: 5 out of 7 days PT Duration Estimated Length of Stay: 18-21 days  OT Intensity: Minimum of 1-2 x/day, 45 to 90 minutes OT Frequency: 5 out of 7 days OT Duration/Estimated Length of Stay: 21-24 days      Team Interventions: Nursing Interventions Bladder Management, Bowel Management, Pain Management  PT interventions Ambulation/gait training, Pain management, Stair training, Training and development officer, DME/adaptive equipment instruction, Patient/family education, Therapeutic Activities, Wheelchair propulsion/positioning, Functional electrical  stimulation, Therapeutic Exercise, Psychosocial support, Community reintegration, Functional mobility training, UE/LE Strength taining/ROM, Discharge planning, Neuromuscular re-education, UE/LE Coordination activities  OT Interventions Training and development officer, Community reintegration, Disease mangement/prevention, Barrister's clerk education, Self Care/advanced ADL retraining, Therapeutic Exercise, UE/LE Coordination activities, Discharge planning, DME/adaptive equipment instruction, Functional mobility training, Psychosocial support, Pain management, Therapeutic Activities, UE/LE Strength taining/ROM  SLP Interventions    TR Interventions    SW/CM Interventions Discharge Planning, Psychosocial Support, Patient/Family Education   Barriers to Discharge MD  Medical stability  Nursing      PT      OT      SLP      SW       Team Discharge Planning: Destination: PT-Home ,OT- Home , SLP-  Projected Follow-up: PT-Home health PT, OT-  Home health OT, SLP-  Projected Equipment Needs: PT-To be determined, OT- To be determined, SLP-  Equipment Details: PT- , OT-  Patient/family involved in discharge planning: PT- Patient,  OT-Patient, SLP-   MD ELOS: 20-22 days Medical Rehab Prognosis:  Excellent Assessment: The patient has been admitted for CIR therapies with the diagnosis of CVA, cervical myelopathy, lumbar radiculopathy. The team will be addressing functional mobility, strength, stamina, balance, safety, adaptive techniques and equipment, self-care, bowel and bladder mgt, patient and caregiver education, NMR, pain control, community reentry. Goals have been set at supervision for mobility and self-care.    Meredith Staggers, MD, [facility]      See Team Conference Notes for  weekly updates to the plan of care

## 2018-01-07 ENCOUNTER — Inpatient Hospital Stay (HOSPITAL_COMMUNITY): Payer: BC Managed Care – PPO

## 2018-01-07 ENCOUNTER — Inpatient Hospital Stay (HOSPITAL_COMMUNITY): Payer: BC Managed Care – PPO | Admitting: Occupational Therapy

## 2018-01-07 ENCOUNTER — Inpatient Hospital Stay (HOSPITAL_COMMUNITY): Payer: BC Managed Care – PPO | Admitting: Physical Therapy

## 2018-01-07 DIAGNOSIS — M5416 Radiculopathy, lumbar region: Secondary | ICD-10-CM

## 2018-01-07 LAB — GLUCOSE, CAPILLARY
GLUCOSE-CAPILLARY: 162 mg/dL — AB (ref 70–99)
GLUCOSE-CAPILLARY: 137 mg/dL — AB (ref 70–99)
Glucose-Capillary: 138 mg/dL — ABNORMAL HIGH (ref 70–99)
Glucose-Capillary: 157 mg/dL — ABNORMAL HIGH (ref 70–99)

## 2018-01-07 NOTE — Progress Notes (Signed)
Occupational Therapy Session Note  Patient Details  Name: Jay Chen MRN: [id number] Date of Birth: 17-Mar-1966  Today's Date: 01/07/2018 OT Individual Time: 1410-1505 OT Individual Time Calculation (min): 55 min   Short Term Goals: Week 1:  OT Short Term Goal 1 (Week 1): Pt will perform sit <> stand with max +1 OT Short Term Goal 2 (Week 1): Pt will thread LB clothing over BLE with (S) OT Short Term Goal 3 (Week 1): Pt will perform 1 grooming task in standing OT Short Term Goal 4 (Week 1): Pt will perform stand pivot transfer to w/c with max A  Skilled Therapeutic Interventions/Progress Updates:    Pt greeted in recliner with no c/o pain. Agreeable to shower. He ambulated with RW to TTB and bathed at sit<stand level with steady assist. LH sponge provided to wash feet safely. Afterwards he ambulated to recliner in room and proceeded to dress. Steady assist overall sit<stand with RW. He was able to meet LB task demands without AE, however required cues for PLB when bending forward. 02 sats 93-96% on RA post bending. We also practiced ambulatory toilet transfers to update safety plan. Discussed DME needs too. At end of session pt was left comfortably in recliner with all needs within reach.   Therapy Documentation Precautions:  Precautions Precautions: Fall Restrictions Weight Bearing Restrictions: No Vital Signs: Therapy Vitals Temp: 98 F (36.7 C) Temp Source: Oral Pulse Rate: 92 Resp: 20 BP: 122/84 Patient Position (if appropriate): Sitting Oxygen Therapy SpO2: 91 % O2 Device: Room Air ADL: ADL ADL Comments: Please see functional navigator     See Function Navigator for Current Functional Status.   Therapy/Group: Individual Therapy  Dereke Neumann A Emilija Bohman 01/07/2018, 5:15 PM

## 2018-01-07 NOTE — Plan of Care (Signed)
  Problem: RH BOWEL ELIMINATION Goal: RH STG MANAGE BOWEL WITH ASSISTANCE Description STG Manage Bowel with Mod I Assistance.  Outcome: Not Progressing; per report incontinent

## 2018-01-07 NOTE — Progress Notes (Signed)
[place] PHYSICAL MEDICINE & REHABILITATION     PROGRESS NOTE    Subjective/Complaints: Still has some pain in the left great toe.  We discussed the various etiologies of his weakness including lumbar radiculopathy cervical myelopathy as well as stroke  ROS: Patient denies fever, rash, sore throat, blurred vision, nausea, vomiting, diarrhea, cough, shortness of breath or chest pain,  back pain, headache, or mood change.    Objective:  No results found. Recent Labs    01/05/18 0530  WBC 10.1  HGB 13.3  HCT 40.8  PLT 325   Recent Labs    01/05/18 0530  NA 140  K 3.9  CL 97*  GLUCOSE 144*  BUN 59*  CREATININE 1.78*  CALCIUM 9.1   CBG (last 3)  Recent Labs    01/06/18 1644 01/06/18 2158 01/07/18 0639  GLUCAP 167* 152* 138*    Wt Readings from Last 3 Encounters:  01/07/18 (!) 159 kg  01/04/18 (!) 155.7 kg  11/22/17 (!) 164.7 kg     Intake/Output Summary (Last 24 hours) at 01/07/2018 1011 Last data filed at 01/07/2018 0831 Gross per 24 hour  Intake 720 ml  Output 1025 ml  Net -305 ml    Vital Signs: Blood pressure (!) 125/93, pulse 88, temperature 98.2 F (36.8 C), temperature source Oral, resp. rate 20, weight (!) 159 kg, SpO2 93 %. Physical Exam:  Constitutional: No distress . Vital signs reviewed. HEENT: EOMI, oral membranes moist Neck: supple Cardiovascular: RRR without murmur. No JVD    Respiratory: CTA Bilaterally without wheezes or rales. Normal effort    GI: BS +, non-tender, non-distended  Neurological: He isalertand oriented to person, place, and time. Follows full commands. Fair awareness of deficits Skin: Skin iswarmand dry. Motor strength is 4-/5 bilateral hip flexor, 4/5 knee extensor ankle dorsiflexor 5/5 bilateral deltoid bicep tricep grip Sensation intact to light touch bilateral upper and lower limb--motor and sensory stable Musc: left great toe tender with palpation along medial MTP, some swelling Psych: pleasant    Assessment/Plan: 1. Functional deficits secondary to cervical cord compression, lumbar stenosis/radic, left temporal lobe infarct which require 3+ hours per day of interdisciplinary therapy in a comprehensive inpatient rehab setting. Physiatrist is providing close team supervision and 24 hour management of active medical problems listed below. Physiatrist and rehab team continue to assess barriers to discharge/monitor patient progress toward functional and medical goals.  Function:  Bathing Bathing position   Position: Sitting EOB  Bathing parts Body parts bathed by patient: Right arm, Left arm, Chest, Front perineal area, Abdomen, Right upper leg, Left upper leg, Right lower leg, Left lower leg Body parts bathed by helper: Buttocks, Back  Bathing assist Assist Level: 2 helpers(sit<stand in [place])      Upper Body Dressing/Undressing Upper body dressing   What is the patient wearing?: Hospital gown     Pull over shirt/dress - Perfomed by patient: Thread/unthread right sleeve, Put head through opening, Thread/unthread left sleeve, Pull shirt over trunk          Upper body assist Assist Level: Set up(No shirt from home)   Set up : To obtain clothing/put away  Lower Body Dressing/Undressing Lower body dressing   What is the patient wearing?: Pants, Shoes, [employer]  - Performed by patient: Thread/unthread left underwear leg, Thread/unthread right underwear leg, Pull underwear up/down Underwear - Performed by helper: Thread/unthread right underwear leg, Pull underwear up/down Pants- Performed by patient: Thread/unthread left pants leg Pants- Performed by helper: Thread/unthread right  pants leg   Non-skid slipper socks- Performed by helper: Don/doff right sock, Don/doff left sock       Shoes - Performed by helper: Don/doff right shoe, Don/doff left shoe, Fasten right, Fasten left       TED Hose - Performed by helper: Don/doff right TED hose, Don/doff left TED hose   Lower body assist Assist for lower body dressing: (Mod A sit<stand with RW)      Toileting Toileting Toileting activity did not occur: No continent bowel/bladder event   Toileting steps completed by helper: Adjust clothing prior to toileting, Performs perineal hygiene, Adjust clothing after toileting Toileting Assistive Devices: Grab bar or rail  Toileting assist Assist level: Two helpers   Transfers Chair/bed transfer Chair/bed transfer activity did not occur: Safety/medical concerns Chair/bed transfer method: Stand pivot Chair/bed transfer assist level: Touching or steadying assistance (Pt > 75%) Chair/bed transfer assistive device: Armrests, Medical sales representative Ambulation activity did not occur: Safety/medical concerns   Max distance: 50 Assist level: Touching or steadying assistance (Pt > 75%)   Wheelchair   Type: Manual Max wheelchair distance: 215 Assist Level: Supervision or verbal cues  Cognition Comprehension Comprehension assist level: Understands basic 90% of the time/cues < 10% of the time  Expression Expression assist level: Expresses basic 90% of the time/requires cueing < 10% of the time.  Social Interaction Social Interaction assist level: Interacts appropriately with others - No medications needed.  Problem Solving Problem solving assist level: Solves basic 90% of the time/requires cueing < 10% of the time  Memory Memory assist level: Recognizes or recalls 90% of the time/requires cueing < 10% of the time   Medical Problem List and Plan: 1.Decreased functional mobilitysecondary to cervical stenosis with cord compression, lumbar spondylosis with radiculopathy, left temporal lobe infarction.Patient is to follow-up outpatient with Dr. Kathyrn Sheriff to discuss possible surgical interventions for lumbar radiculopathy  -CIR PT OT 2. DVT Prophylaxis/Anticoagulation: Xarelto. 3. Pain Management:Robaxin 500 mg 3 times daily, oxycodone as needed 4.  Mood:Provide emotional support 5. Neuropsych: This patientiscapable of making decisions on hisown behalf. 6. Skin/Wound Care:Routine skin checks 7. Fluids/Electrolytes/Nutrition:Rencourage PO 8.Persistent atrial fibrillation. Cardiac rate controlled. Amiodarone 200 mg twice daily,Toprol 100mg  daily Vitals:   01/06/18 1940 01/07/18 0511  BP: 119/79 (!) 125/93  Pulse: 77 88  Resp: 16 20  Temp: 97.7 F (36.5 C) 98.2 F (36.8 C)  SpO2: 98% 93%  Rate is controlled 8/17 9.Chronic systolic and diastolic congestive heart failure. Monitor for any signs of fluid overload. Lasix 40 mg Daily  - Filed Weights   01/05/18 1655 01/06/18 0655 01/07/18 0534  Weight: (!) 156.8 kg (!) 159 kg (!) 159 kg    -follow weight trend 10.LLLpneumonia. Follow-up chest x-ray 01/03/2018 with no acute process. Complete course of Omnicef and doxycycline 11. Diabetes mellitus with peripheral neuropathy. Hemoglobin A1c 8.4. SSI. Check blood sugars before meals and at bedtime. Patient on Glucophage 1000 mg twice daily prior to admission currently on hold due to renal insufficiency  - CBG (last 3)  Recent Labs    01/06/18 1644 01/06/18 2158 01/07/18 0639  GLUCAP 167* 152* 138*  Well-controlled 8/17 12.CKD stage III. recheck labs next week 13.Hyperlipidemia. Lovaza/Lipitor 14.Morbid obesity. BMI 51.99. Dietary follow-up 15.Constipation. Laxative assistance 16. Gout left great toe: improved today  -options limited given renal/DM  -continue prednisone 10mg , taper quickly off  -push fluids, dietary considerations discussed   LOS (Days) 3 A FACE TO FACE EVALUATION WAS PERFORMED  Charlett Blake,  MD 01/07/2018 10:11 AM

## 2018-01-07 NOTE — Progress Notes (Signed)
Physical Therapy Session Note  Patient Details  Name: [name] MRN: [id number] Date of Birth: 01-25-1966  Today's Date: 01/07/2018 PT Individual Time: 1510-1550 PT Individual Time Calculation (min): 40 min   Short Term Goals: Week 1:  PT Short Term Goal 1 (Week 1): Pt will perform all bed mobility with CGA  PT Short Term Goal 2 (Week 1): Pt will perform STS with mod A and LRAD  PT Short Term Goal 3 (Week 1): Pt will perform transfers with mod A and LRAD  PT Short Term Goal 4 (Week 1): Pt will ambulate 15 feet with LRAD and assist   Skilled Therapeutic Interventions/Progress Updates:    Pt seated in recliner upon PT arrival, agreeable to therapy tx and denies pain. Pt transferred sit<>stand from recliner with min guard assist and performed stand pivot to w/c with min assist and RW. Pt propelled w/c x 50 ft using B UEs and supervision, therapist transported the rest of the way. Pt ambulated 2 x 55 ft this session with RW and CGA working on activity tolerance and endurance, SpO2 >88% throughout session while on RA. Pt worked on dynamic standing balance and LE strength to perform toe taps on 4 inch step with light UE support on RW, x 2 trials, CGA. Pt propelled to rehab apartment with supervision. Pt ambulated 2 x 10 ft and performed furniture transfer on/off couch with min assist and RW. Pt transported back to room and transferred to recliner with  Min assist, left with needs in reach.   Therapy Documentation Precautions:  Precautions Precautions: Fall Restrictions Weight Bearing Restrictions: No   See Function Navigator for Current Functional Status.   Therapy/Group: Individual Therapy  Netta Corrigan, PT, DPT 01/07/2018, 12:24 PM

## 2018-01-07 NOTE — Progress Notes (Signed)
Physical Therapy Session Note  Patient Details  Name: Jay Chen MRN: [id number] Date of Birth: [birth year]  Today's Date: 01/07/2018 PT Individual Time: 0900-1030 PT Individual Time Calculation (min): 90 min   Short Term Goals: Week 1:  PT Short Term Goal 1 (Week 1): Pt will perform all bed mobility with CGA  PT Short Term Goal 2 (Week 1): Pt will perform STS with mod A and LRAD  PT Short Term Goal 3 (Week 1): Pt will perform transfers with mod A and LRAD  PT Short Term Goal 4 (Week 1): Pt will ambulate 15 feet with LRAD and assist   Skilled Therapeutic Interventions/Progress Updates: Pt received seated in bed, denies pain, "not yet", and agreeable to treatment. Supine>sit with S, HOB elevated and bedrails. Pt dons shorts with S, requires assist for socks/shoes after unsuccessful attempts. Sit >stand minA from bed with RW; pt noted to have soiled chuck pad. Standing balance S with RW while therapist performed hygiene. Stand pivot min guard with RW to w/c. Seated in w/c pt performs grooming/hygiene at sink with modI. W/c propulsion x100' for UE strengthening and endurance, on room air for trial, O2 96% following. Standing gastroc stretch x2 min on medium blue wedge. Unable to perform standing heel raises d/t strength deficits; performed BLE level 2 theraband resisted plantarflexion 2x15 reps each. Step up/down bottom 3" step with B handrails and min guard x8 reps; second trial pt ascended/descended 8 steps step-to pattern forward, min guard. Attempted reciprocal pattern however unable with lead LLE d/t strength deficits. Following stair trial, on room air pt desat to 86% and required several minutes to recover. Pt returned to 1L 02 via [place] for remainder of session. Performed car transfer RW and min guard. Bed transfer on flat bed in ADL apartment with S; pt reports he lays across bed sideways to watch tv then sleeps sideways in bed as demonstrated. Returned to room totalA; stand pivot to recliner  with RW and min guard. Remained in recliner at end of session, 2L O2 via [place], all needs in reach.      Therapy Documentation Precautions:  Precautions Precautions: Fall Restrictions Weight Bearing Restrictions: No   See Function Navigator for Current Functional Status.   Therapy/Group: Individual Therapy  Corliss Skains 01/07/2018, 10:30 AM

## 2018-01-08 LAB — GLUCOSE, CAPILLARY
GLUCOSE-CAPILLARY: 119 mg/dL — AB (ref 70–99)
GLUCOSE-CAPILLARY: 159 mg/dL — AB (ref 70–99)
GLUCOSE-CAPILLARY: 143 mg/dL — AB (ref 70–99)
Glucose-Capillary: 124 mg/dL — ABNORMAL HIGH (ref 70–99)

## 2018-01-08 NOTE — Progress Notes (Signed)
[facility] PHYSICAL MEDICINE & REHABILITATION     PROGRESS NOTE    Subjective/Complaints: Pain but no swelling in the left great toe he is wearing his usual foot wear No numbness tingling shooting down the leg.  Patient does have MRI findings of foraminal stenosis including facet joint cyst at L5-S1 the right side  ROS: Patient denies fever, rash, sore throat, blurred vision, nausea, vomiting, diarrhea, cough, shortness of breath or chest pain,  back pain, headache, or mood change.    Objective:  No results found. No results for input(s): WBC, HGB, HCT, PLT in the last 72 hours. No results for input(s): NA, K, CL, GLUCOSE, BUN, CREATININE, CALCIUM in the last 72 hours.  Invalid input(s): CO CBG (last 3)  Recent Labs    01/07/18 2130 01/08/18 0657 01/08/18 1201  GLUCAP 137* 119* 159*    Wt Readings from Last 3 Encounters:  01/08/18 (!) 159.4 kg  01/04/18 (!) 155.7 kg  11/22/17 (!) 164.7 kg     Intake/Output Summary (Last 24 hours) at 01/08/2018 1216 Last data filed at 01/08/2018 0758 Gross per 24 hour  Intake 720 ml  Output 1225 ml  Net -505 ml    Vital Signs: Blood pressure 133/90, pulse 85, temperature 98 F (36.7 C), temperature source Oral, resp. rate 18, weight (!) 159.4 kg, SpO2 95 %. Physical Exam:  Constitutional: No distress . Vital signs reviewed. HEENT: EOMI, oral membranes moist Neck: supple Cardiovascular: RRR without murmur. No JVD    Respiratory: CTA Bilaterally without wheezes or rales. Normal effort    GI: BS +, non-tender, non-distended  Neurological: He isalertand oriented to person, place, and time. Follows full commands. Fair awareness of deficits Skin: Skin iswarmand dry. Motor strength is 4-/5 bilateral hip flexor, 4/5 knee extensor ankle dorsiflexor 5/5 bilateral deltoid bicep tricep grip Sensation intact to light touch bilateral upper and lower limb--motor and sensory stable Musc: left great toe tender with palpation along  medial MTP, no swelling or erythema Psych: pleasant   Assessment/Plan: 1. Functional deficits secondary to cervical cord compression, lumbar stenosis/radic, left temporal lobe infarct which require 3+ hours per day of interdisciplinary therapy in a comprehensive inpatient rehab setting. Physiatrist is providing close team supervision and 24 hour management of active medical problems listed below. Physiatrist and rehab team continue to assess barriers to discharge/monitor patient progress toward functional and medical goals.  Function:  Bathing Bathing position   Position: Shower  Bathing parts Body parts bathed by patient: Right arm, Left arm, Chest, Front perineal area, Abdomen, Right upper leg, Left upper leg, Right lower leg, Left lower leg, Buttocks, Back Body parts bathed by helper: Buttocks, Back  Bathing assist Assist Level: Touching or steadying assistance(Pt > 75%), Assistive device Assistive Device Comment: LH sponge    Upper Body Dressing/Undressing Upper body dressing   What is the patient wearing?: Pull over shirt/dress     Pull over shirt/dress - Perfomed by patient: Thread/unthread right sleeve, Put head through opening, Thread/unthread left sleeve, Pull shirt over trunk          Upper body assist Assist Level: Set up   Set up : To obtain clothing/put away  Lower Body Dressing/Undressing Lower body dressing   What is the patient wearing?: Pants, Shoes, Socks Underwear - Performed by patient: Thread/unthread left underwear leg, Thread/unthread right underwear leg, Pull underwear up/down Underwear - Performed by helper: Thread/unthread right underwear leg, Pull underwear up/down Pants- Performed by patient: Thread/unthread left pants leg, Thread/unthread right pants leg,  Pull pants up/down Pants- Performed by helper: Thread/unthread right pants leg   Non-skid slipper socks- Performed by helper: Don/doff right sock, Don/doff left sock Socks - Performed by patient:  Don/doff right sock, Don/doff left sock   Shoes - Performed by patient: Don/doff right shoe, Fasten right, Fasten left, Don/doff left shoe Shoes - Performed by helper: Don/doff right shoe, Don/doff left shoe, Fasten right, Fasten left       TED Hose - Performed by helper: Don/doff right TED hose, Don/doff left TED hose  Lower body assist Assist for lower body dressing: Touching or steadying assistance (Pt > 75%)      Toileting Toileting Toileting activity did not occur: No continent bowel/bladder event Toileting steps completed by patient: Adjust clothing prior to toileting, Performs perineal hygiene, Adjust clothing after toileting Toileting steps completed by helper: Adjust clothing prior to toileting, Performs perineal hygiene, Adjust clothing after toileting Toileting Assistive Devices: Grab bar or rail  Toileting assist Assist level: Two helpers   Transfers Chair/bed transfer Chair/bed transfer activity did not occur: Safety/medical concerns Chair/bed transfer method: Stand pivot, Ambulatory Chair/bed transfer assist level: Supervision or verbal cues Chair/bed transfer assistive device: Armrests, Medical sales representative Ambulation activity did not occur: Safety/medical concerns   Max distance: 55 ft Assist level: Touching or steadying assistance (Pt > 75%)   Wheelchair   Type: Manual Max wheelchair distance: 215 Assist Level: Supervision or verbal cues  Cognition Comprehension Comprehension assist level: Understands basic 90% of the time/cues < 10% of the time  Expression Expression assist level: Expresses basic 90% of the time/requires cueing < 10% of the time.  Social Interaction Social Interaction assist level: Interacts appropriately with others - No medications needed.  Problem Solving Problem solving assist level: Solves basic 90% of the time/requires cueing < 10% of the time  Memory Memory assist level: Recognizes or recalls 90% of the time/requires cueing  < 10% of the time   Medical Problem List and Plan: 1.Decreased functional mobilitysecondary to cervical stenosis with cord compression, lumbar spondylosis with radiculopathy, left temporal lobe infarction.Patient is to follow-up outpatient with Dr. Kathyrn Sheriff to discuss possible surgical interventions for lumbar radiculopathy  -CIR PT OT 2. DVT Prophylaxis/Anticoagulation: Xarelto.  Check CBC in a.m. 3. Pain Management:Robaxin 500 mg 3 times daily, oxycodone as needed 4. Mood:Provide emotional support 5. Neuropsych: This patientiscapable of making decisions on hisown behalf. 6. Skin/Wound Care:Routine skin checks 7. Fluids/Electrolytes/Nutrition:[place] PO 8.Persistent atrial fibrillation. Cardiac rate controlled. Amiodarone 200 mg twice daily,Toprol 100mg  daily Vitals:   01/07/18 1955 01/08/18 0419  BP: 116/88 133/90  Pulse: 91 85  Resp: 18 18  Temp: 97.9 F (36.6 C) 98 F (36.7 C)  SpO2: 93% 95%  Rate is controlled 8/18 9.Chronic systolic and diastolic congestive heart failure. Monitor for any signs of fluid overload. Lasix 40 mg Daily, check electrolytes in a.m.  - Filed Weights   01/06/18 0655 01/07/18 0534 01/08/18 0419  Weight: (!) 159 kg (!) 159 kg (!) 159.4 kg    -follow weight trend 10.LLLpneumonia. Follow-up chest x-ray 01/03/2018 with no acute process. Complete course of Omnicef and doxycycline 11. Diabetes mellitus with peripheral neuropathy. Hemoglobin A1c 8.4. SSI. Check blood sugars before meals and at bedtime. Patient on Glucophage 1000 mg twice daily prior to admission currently on hold due to renal insufficiency  - CBG (last 3)  Recent Labs    01/07/18 2130 01/08/18 0657 01/08/18 1201  GLUCAP 137* 119* 159*  Well-controlled 8/18 12.CKD stage III. recheck  labs next week 13.Hyperlipidemia. Lovaza/Lipitor 14.Morbid obesity. BMI 51.99. Dietary follow-up 15.Constipation. Laxative assistance 16. Gout left great  toe: improved off steroids, no sign of inflammation but mild residual pain  -options limited given renal/DM  -continue prednisone 10mg , taper quickly off  -push fluids, dietary considerations discussed   LOS (Days) 4 A FACE TO FACE EVALUATION WAS PERFORMED  Charlett Blake, MD 01/08/2018 12:16 PM

## 2018-01-09 ENCOUNTER — Inpatient Hospital Stay (HOSPITAL_COMMUNITY): Payer: BC Managed Care – PPO | Admitting: Physical Therapy

## 2018-01-09 ENCOUNTER — Inpatient Hospital Stay (HOSPITAL_COMMUNITY): Payer: BC Managed Care – PPO | Admitting: Occupational Therapy

## 2018-01-09 LAB — CBC
HCT: 43.1 % (ref 39.0–52.0)
HEMOGLOBIN: 14 g/dL (ref 13.0–17.0)
MCH: 30.8 pg (ref 26.0–34.0)
MCHC: 32.5 g/dL (ref 30.0–36.0)
MCV: 94.7 fL (ref 78.0–100.0)
Platelets: 388 10*3/uL (ref 150–400)
RBC: 4.55 MIL/uL (ref 4.22–5.81)
RDW: 13.7 % (ref 11.5–15.5)
WBC: 9.6 10*3/uL (ref 4.0–10.5)

## 2018-01-09 LAB — GLUCOSE, CAPILLARY
GLUCOSE-CAPILLARY: 116 mg/dL — AB (ref 70–99)
Glucose-Capillary: 140 mg/dL — ABNORMAL HIGH (ref 70–99)
Glucose-Capillary: 130 mg/dL — ABNORMAL HIGH (ref 70–99)
Glucose-Capillary: 135 mg/dL — ABNORMAL HIGH (ref 70–99)

## 2018-01-09 LAB — BASIC METABOLIC PANEL
ANION GAP: 9 (ref 5–15)
BUN: 53 mg/dL — ABNORMAL HIGH (ref 6–20)
CO2: 33 mmol/L — AB (ref 22–32)
Calcium: 9.1 mg/dL (ref 8.9–10.3)
Chloride: 99 mmol/L (ref 98–111)
Creatinine, Ser: 1.94 mg/dL — ABNORMAL HIGH (ref 0.61–1.24)
GFR calc Af Amer: 44 mL/min — ABNORMAL LOW (ref >60)
GFR, EST NON AFRICAN AMERICAN: 38 mL/min — AB (ref >60)
GLUCOSE: 142 mg/dL — AB (ref 70–99)
POTASSIUM: 4.1 mmol/L (ref 3.5–5.1)
Sodium: 141 mmol/L (ref 135–145)

## 2018-01-09 NOTE — Progress Notes (Signed)
Occupational Therapy Session Note  Patient Details  Name: Jay Chen MRN: [id number] Date of Birth: Jul 09, 1965  Today's Date: 01/09/2018 OT Individual Time: 1300-1400 OT Individual Time Calculation (min): 60 min    Short Term Goals: Week 1:  OT Short Term Goal 1 (Week 1): Pt will perform sit <> stand with max +1 OT Short Term Goal 2 (Week 1): Pt will thread LB clothing over BLE with (S) OT Short Term Goal 3 (Week 1): Pt will perform 1 grooming task in standing OT Short Term Goal 4 (Week 1): Pt will perform stand pivot transfer to w/c with max A  Skilled Therapeutic Interventions/Progress Updates:    1:1 Pt received in the w/c. Pt ambulated to the bathroom with RW and performed toileting with min guard and extra time. Pt able to propel w/c with bilateral feet with extra time down the hallway ~70 feet to work on LE coordination and overall conditioning. In the hallway addressed LE strengthening and coordination with sidestepping down the "green line" pushing a 3 lb block in both directions twice with seated rest break inbetween. Pt perform 4 sit <> stands without UE support with extra time and mod A for a boost up without use of hands. Transition into the gym and to a slightly elevated mat- performed 15 passes of a 3lb weighted ball to the rebounder along with sit to stands. Transitioned into sidelying with supervision and performed hip abduction, extension and flexion with control (for both right and left ) for hip strengthening.  All activities for cardiopulmonary endurance for increased performance with ADLs.  Therapy Documentation Precautions:  Precautions Precautions: Fall Restrictions Weight Bearing Restrictions: No General:   Vital Signs: Therapy Vitals Temp: 98.5 F (36.9 C) Temp Source: Oral Pulse Rate: 88 Resp: 18 BP: 110/70 Patient Position (if appropriate): Sitting Oxygen Therapy SpO2: 91 % O2 Device: Room Air Pain:  no c/o  Pain in session  ADL: ADL ADL  Comments: Please see functional navigator  See Function Navigator for Current Functional Status.   Therapy/Group: Individual Therapy  Willeen Cass [facility] 01/09/2018, 3:55 PM

## 2018-01-09 NOTE — Progress Notes (Signed)
Occupational Therapy Session Note  Patient Details  Name: Jay Chen MRN: [id number] Date of Birth: [birth year]  Today's Date: 01/09/2018 OT Individual Time: 9470-9628 OT Individual Time Calculation (min): 72 min   Short Term Goals: Week 1:  OT Short Term Goal 1 (Week 1): Pt will perform sit <> stand with max +1 OT Short Term Goal 2 (Week 1): Pt will thread LB clothing over BLE with (S) OT Short Term Goal 3 (Week 1): Pt will perform 1 grooming task in standing OT Short Term Goal 4 (Week 1): Pt will perform stand pivot transfer to w/c with max A  Skilled Therapeutic Interventions/Progress Updates:    Pt greeted in bed with no c/o pain. Amenable to shower. Tx focus on balance, functional ambulation with device, and activity tolerance during bathing, dressing, and toileting. All functional bathroom transfers completed using RW with steady assist at ambulatory level. Pt used LH sponge for washing LB and back in shower. Steady assist for perihygiene in standing. He expressed interest in using sock aide, so he tried this with Min A and instruction before toilet transfer. Steady assist for perihygiene post B+B void. Afterwards he gathered clothing items via ambulation to drawer and dressed sit<stand from EOB. Increased time for donning footwear without AE. After completing oral care/grooming tasks while standing at sink, he returned to recliner. For remainder of session, we solidified DME needs while he assisted OT with bedmaking tasks. At end of tx pt was left in recliner with all needs within reach.   Therapy Documentation Precautions:  Precautions Precautions: Fall Restrictions Weight Bearing Restrictions: No Vital Signs: Therapy Vitals Temp: 97.6 F (36.4 C) Temp Source: Oral Pulse Rate: 90 Resp: 19 BP: (!) 132/99 Patient Position (if appropriate): Lying Oxygen Therapy SpO2: 95 % O2 Device: Room Air ADL: ADL ADL Comments: Please see functional navigator     See Function  Navigator for Current Functional Status.   Therapy/Group: Individual Therapy  Noell Lorensen A Riggin Cuttino 01/09/2018, 9:11 AM

## 2018-01-09 NOTE — Progress Notes (Signed)
Physical Therapy Session Note  Patient Details  Name: Jay Chen MRN: [id number] Date of Birth: 12/14/1965  Today's Date: 01/09/2018 PT Individual Time: 1400-1500 PT Individual Time Calculation (min): 60 min   Short Term Goals: Week 1:  PT Short Term Goal 1 (Week 1): Pt will perform all bed mobility with CGA  PT Short Term Goal 2 (Week 1): Pt will perform STS with mod A and LRAD  PT Short Term Goal 3 (Week 1): Pt will perform transfers with mod A and LRAD  PT Short Term Goal 4 (Week 1): Pt will ambulate 15 feet with LRAD and assist   Skilled Therapeutic Interventions/Progress Updates:    Pt reports 2/10 L hip pain prior to session start and therapist monitored pain levels throughout session duration. Pt propels w/c using B UE's from room> gym for 215 feet with one rest break during trial for UE strengthening and endurance. Pt ambulates 150 feet with RW and CGA progressing from step-to pattern to reciprocal stepping when cued. Pt performs all STS and stand pivot transfers consistently with supervision throughout session. Pt performs obstacle course for x2 trials with RW and CGA consisting of cone weaving and ambulation over uneven surface for increased challenge. Pt performs anterograde and retrograde ambulation using R HR in hall for 50 feet and CGA for x2 trials. PT progressed Pt to no HR for anterograde and intermittant HR for retrograde ambulation during 2nd trial for increased challenge. Pt performs x2 trials of side stepping with blue t band around ankles for increased resistance to promote glute med strengthening. Pt performs x8 steps with B HR's and CGA with step to pattern. Pt required seated rest break and then attempted stairs using single HR but unable to 2/2 fatigue and heavy UE reliance. Pt then propelled chair 75 feet with UE's back to room and therapist propelled chair rest of way 2/2 fatigue. Pt performs stand pivot transfer from w/c> recliner using RW and left sitting in recliner  with tray table and call bell in reach and all needs met.   Therapy Documentation Precautions:  Precautions Precautions: Fall Restrictions Weight Bearing Restrictions: No   See Function Navigator for Current Functional Status.   Therapy/Group: Individual Therapy  Cara Poole 01/09/2018, 4:38 PM  

## 2018-01-09 NOTE — Progress Notes (Signed)
[place] PHYSICAL MEDICINE & REHABILITATION     PROGRESS NOTE    Subjective/Complaints: Pt up with OT in the bathroom. Back, left toe pain  ROS: Patient denies fever, rash, sore throat, blurred vision, nausea, vomiting, diarrhea, cough, shortness of breath or chest pain,  headache, or mood change.    Objective:  No results found. Recent Labs    01/09/18 0640  WBC 9.6  HGB 14.0  HCT 43.1  PLT 388   Recent Labs    01/09/18 0640  NA 141  K 4.1  CL 99  GLUCOSE 142*  BUN 53*  CREATININE 1.94*  CALCIUM 9.1   CBG (last 3)  Recent Labs    01/08/18 1642 01/08/18 2203 01/09/18 0638  GLUCAP 143* 124* 140*    Wt Readings from Last 3 Encounters:  01/09/18 (!) 154.7 kg  01/04/18 (!) 155.7 kg  11/22/17 (!) 164.7 kg     Intake/Output Summary (Last 24 hours) at 01/09/2018 0845 Last data filed at 01/09/2018 0739 Gross per 24 hour  Intake 720 ml  Output 1325 ml  Net -605 ml    Vital Signs: Blood pressure (!) 132/99, pulse 90, temperature 97.6 F (36.4 C), temperature source Oral, resp. rate 19, weight (!) 154.7 kg, SpO2 95 %. Physical Exam:  Constitutional: No distress . Vital signs reviewed. HEENT: EOMI, oral membranes moist Neck: supple Cardiovascular: RRR without murmur. No JVD    Respiratory: CTA Bilaterally without wheezes or rales. Normal effort    GI: BS +, non-tender, non-distended  Neurological: He isalertand oriented to person, place, and time. Follows full commands. Fair awareness of deficits Skin: Skin iswarmand dry. Motor strength is 4-/5 bilateral hip flexor, 4/5 knee extensor ankle dorsiflexor 5/5 bilateral deltoid bicep tricep grip Sensation intact to light touch bilateral upper and lower limb--motor and sensory stable Musc: decreased left toe pain Psych: pleasant   Assessment/Plan: 1. Functional deficits secondary to cervical cord compression, lumbar stenosis/radic, left temporal lobe infarct which require 3+ hours per day of  interdisciplinary therapy in a comprehensive inpatient rehab setting. Physiatrist is providing close team supervision and 24 hour management of active medical problems listed below. Physiatrist and rehab team continue to assess barriers to discharge/monitor patient progress toward functional and medical goals.  Function:  Bathing Bathing position   Position: Shower  Bathing parts Body parts bathed by patient: Right arm, Left arm, Chest, Front perineal area, Abdomen, Right upper leg, Left upper leg, Right lower leg, Left lower leg, Buttocks, Back Body parts bathed by helper: Buttocks, Back  Bathing assist Assist Level: Touching or steadying assistance(Pt > 75%), Assistive device Assistive Device Comment: LH sponge    Upper Body Dressing/Undressing Upper body dressing   What is the patient wearing?: Pull over shirt/dress     Pull over shirt/dress - Perfomed by patient: Thread/unthread right sleeve, Put head through opening, Thread/unthread left sleeve, Pull shirt over trunk          Upper body assist Assist Level: Set up   Set up : To obtain clothing/put away  Lower Body Dressing/Undressing Lower body dressing   What is the patient wearing?: Pants, Shoes, Socks Underwear - Performed by patient: Thread/unthread left underwear leg, Thread/unthread right underwear leg, Pull underwear up/down Underwear - Performed by helper: Thread/unthread right underwear leg, Pull underwear up/down Pants- Performed by patient: Thread/unthread left pants leg, Thread/unthread right pants leg, Pull pants up/down Pants- Performed by helper: Thread/unthread right pants leg   Non-skid slipper socks- Performed by helper: Don/doff right sock,  Don/doff left sock Socks - Performed by patient: Don/doff right sock, Don/doff left sock   Shoes - Performed by patient: Don/doff right shoe, Fasten right, Fasten left, Don/doff left shoe Shoes - Performed by helper: Don/doff right shoe, Don/doff left shoe, Fasten  right, Fasten left       TED Hose - Performed by helper: Don/doff right TED hose, Don/doff left TED hose  Lower body assist Assist for lower body dressing: Touching or steadying assistance (Pt > 75%)      Toileting Toileting Toileting activity did not occur: No continent bowel/bladder event Toileting steps completed by patient: Adjust clothing prior to toileting, Performs perineal hygiene, Adjust clothing after toileting Toileting steps completed by helper: Adjust clothing prior to toileting, Performs perineal hygiene, Adjust clothing after toileting Toileting Assistive Devices: Grab bar or rail  Toileting assist Assist level: Touching or steadying assistance (Pt.75%)   Transfers Chair/bed transfer Chair/bed transfer activity did not occur: Safety/medical concerns Chair/bed transfer method: Stand pivot, Ambulatory Chair/bed transfer assist level: Supervision or verbal cues Chair/bed transfer assistive device: Armrests, Medical sales representative Ambulation activity did not occur: Safety/medical concerns   Max distance: 55 ft Assist level: Touching or steadying assistance (Pt > 75%)   Wheelchair   Type: Manual Max wheelchair distance: 215 Assist Level: Supervision or verbal cues  Cognition Comprehension Comprehension assist level: Understands basic 90% of the time/cues < 10% of the time  Expression Expression assist level: Expresses basic 90% of the time/requires cueing < 10% of the time.  Social Interaction Social Interaction assist level: Interacts appropriately 90% of the time - Needs monitoring or encouragement for participation or interaction.  Problem Solving Problem solving assist level: Solves basic 90% of the time/requires cueing < 10% of the time  Memory Memory assist level: Recognizes or recalls 90% of the time/requires cueing < 10% of the time   Medical Problem List and Plan: 1.Decreased functional mobilitysecondary to cervical stenosis with cord  compression, lumbar spondylosis with radiculopathy, left temporal lobe infarction.Patient is to follow-up outpatient with Dr. Kathyrn Sheriff to discuss possible surgical interventions for lumbar radiculopathy  -CIR PT OT 2. DVT Prophylaxis/Anticoagulation: Xarelto.  CBC normal 8/19 3. Pain Management:Robaxin 500 mg 3 times daily, oxycodone as needed 4. Mood:Provide emotional support 5. Neuropsych: This patientiscapable of making decisions on hisown behalf. 6. Skin/Wound Care:Routine skin checks 7. Fluids/Electrolytes/Nutrition:I personally reviewed the patient's labs today.   8.Persistent atrial fibrillation. Cardiac rate controlled. Amiodarone 200 mg twice daily,Toprol 100mg  daily Vitals:   01/08/18 1958 01/09/18 0512  BP: (!) 130/94 (!) 132/99  Pulse: 82 90  Resp: 17 19  Temp: (!) 97.5 F (36.4 C) 97.6 F (36.4 C)  SpO2: 94% 95%  Rate is controlled 8/19 9.Chronic systolic and diastolic congestive heart failure. Monitor for any signs of fluid overload. Lasix 40 mg Daily  - Filed Weights   01/07/18 0534 01/08/18 0419 01/09/18 0512  Weight: (!) 159 kg (!) 159.4 kg (!) 154.7 kg    -follow weight trend 10.LLLpneumonia. Follow-up chest x-ray 01/03/2018 with no acute process. Complete course of Omnicef and doxycycline 11. Diabetes mellitus with peripheral neuropathy. Hemoglobin A1c 8.4. SSI. Check blood sugars before meals and at bedtime. Patient on Glucophage 1000 mg twice daily prior to admission currently on hold due to renal insufficiency  - CBG (last 3)  Recent Labs    01/08/18 1642 01/08/18 2203 01/09/18 0638  GLUCAP 143* 124* 140*  Well-controlled 8/19 12.CKD stage III. Cr sl elevated 1.9, recheck later this week  13.Hyperlipidemia. Lovaza/Lipitor 14.Morbid obesity. BMI 51.99. Dietary follow-up 15.Constipation. Laxative assistance 16. Gout left great toe: improved off steroids, still some pain  --push fluids, dietary considerations  discussed   LOS (Days) 5 A FACE TO FACE EVALUATION WAS PERFORMED  Meredith Staggers, MD 01/09/2018 8:45 AM

## 2018-01-10 ENCOUNTER — Encounter (HOSPITAL_COMMUNITY): Payer: BC Managed Care – PPO | Admitting: Psychology

## 2018-01-10 ENCOUNTER — Inpatient Hospital Stay (HOSPITAL_COMMUNITY): Payer: BC Managed Care – PPO | Admitting: Physical Therapy

## 2018-01-10 ENCOUNTER — Inpatient Hospital Stay (HOSPITAL_COMMUNITY): Payer: BC Managed Care – PPO | Admitting: Occupational Therapy

## 2018-01-10 ENCOUNTER — Inpatient Hospital Stay (HOSPITAL_COMMUNITY): Payer: BC Managed Care – PPO

## 2018-01-10 DIAGNOSIS — M5410 Radiculopathy, site unspecified: Secondary | ICD-10-CM

## 2018-01-10 LAB — GLUCOSE, CAPILLARY
GLUCOSE-CAPILLARY: 125 mg/dL — AB (ref 70–99)
Glucose-Capillary: 131 mg/dL — ABNORMAL HIGH (ref 70–99)
Glucose-Capillary: 126 mg/dL — ABNORMAL HIGH (ref 70–99)
Glucose-Capillary: 111 mg/dL — ABNORMAL HIGH (ref 70–99)

## 2018-01-10 NOTE — Progress Notes (Signed)
[place] PHYSICAL MEDICINE & REHABILITATION     PROGRESS NOTE    Subjective/Complaints: Notes some mild left hip pain but otherwise doing well  ROS: Patient denies fever, rash, sore throat, blurred vision, nausea, vomiting, diarrhea, cough, shortness of breath or chest pain, joint or back pain, headache, or mood change.    Objective:  No results found. Recent Labs    01/09/18 0640  WBC 9.6  HGB 14.0  HCT 43.1  PLT 388   Recent Labs    01/09/18 0640  NA 141  K 4.1  CL 99  GLUCOSE 142*  BUN 53*  CREATININE 1.94*  CALCIUM 9.1   CBG (last 3)  Recent Labs    01/09/18 1637 01/09/18 2101 01/10/18 0640  GLUCAP 135* 116* 131*    Wt Readings from Last 3 Encounters:  01/10/18 (!) 160.9 kg  01/04/18 (!) 155.7 kg  11/22/17 (!) 164.7 kg     Intake/Output Summary (Last 24 hours) at 01/10/2018 0844 Last data filed at 01/10/2018 0809 Gross per 24 hour  Intake 960 ml  Output 1300 ml  Net -340 ml    Vital Signs: Blood pressure 119/72, pulse 74, temperature 97.6 F (36.4 C), temperature source Oral, resp. rate 18, weight (!) 160.9 kg, SpO2 94 %. Physical Exam:  Constitutional: No distress . Vital signs reviewed. HEENT: EOMI, oral membranes moist Neck: supple Cardiovascular: RRR without murmur. No JVD    Respiratory: CTA Bilaterally without wheezes or rales. Normal effort    GI: BS +, non-tender, non-distended  Neurological: He isalertand oriented to person, place, and time. Follows full commands.  Skin: Skin iswarmand dry. Motor strength is 4-/5 bilateral hip flexor, 4/5 knee extensor ankle dorsiflexor 5/5 bilateral deltoid bicep tricep grip Sensation intact to light touch bilateral upper and lower limb--motor and sensory stable Musc: mild lateral hip pain left Psych: pleasant   Assessment/Plan: 1. Functional deficits secondary to cervical cord compression, lumbar stenosis/radic, left temporal lobe infarct which require 3+ hours per day of  interdisciplinary therapy in a comprehensive inpatient rehab setting. Physiatrist is providing close team supervision and 24 hour management of active medical problems listed below. Physiatrist and rehab team continue to assess barriers to discharge/monitor patient progress toward functional and medical goals.  Function:  Bathing Bathing position   Position: Shower  Bathing parts Body parts bathed by patient: Right arm, Left arm, Chest, Front perineal area, Abdomen, Right upper leg, Left upper leg, Right lower leg, Left lower leg, Buttocks, Back Body parts bathed by helper: Buttocks, Back  Bathing assist Assist Level: Touching or steadying assistance(Pt > 75%), Assistive device Assistive Device Comment: LH sponge    Upper Body Dressing/Undressing Upper body dressing   What is the patient wearing?: Pull over shirt/dress     Pull over shirt/dress - Perfomed by patient: Thread/unthread right sleeve, Put head through opening, Thread/unthread left sleeve, Pull shirt over trunk          Upper body assist Assist Level: Set up   Set up : To obtain clothing/put away  Lower Body Dressing/Undressing Lower body dressing   What is the patient wearing?: Pants, Shoes, [employer]  - Performed by patient: Thread/unthread left underwear leg, Thread/unthread right underwear leg, Pull underwear up/down Underwear - Performed by helper: Thread/unthread right underwear leg, Pull underwear up/down Pants- Performed by patient: Thread/unthread left pants leg, Thread/unthread right pants leg, Pull pants up/down Pants- Performed by helper: Thread/unthread right pants leg   Non-skid slipper socks- Performed by helper: Don/doff right sock,  Don/doff left sock Socks - Performed by patient: Don/doff right sock, Don/doff left sock   Shoes - Performed by patient: Don/doff right shoe, Fasten right, Fasten left, Don/doff left shoe Shoes - Performed by helper: Don/doff right shoe, Don/doff left shoe, Fasten  right, Fasten left       TED Hose - Performed by helper: Don/doff right TED hose, Don/doff left TED hose  Lower body assist Assist for lower body dressing: Touching or steadying assistance (Pt > 75%)      Toileting Toileting Toileting activity did not occur: No continent bowel/bladder event Toileting steps completed by patient: Adjust clothing prior to toileting, Performs perineal hygiene, Adjust clothing after toileting Toileting steps completed by helper: Adjust clothing prior to toileting, Performs perineal hygiene, Adjust clothing after toileting Toileting Assistive Devices: Grab bar or rail  Toileting assist Assist level: Touching or steadying assistance (Pt.75%)   Transfers Chair/bed transfer Chair/bed transfer activity did not occur: Safety/medical concerns Chair/bed transfer method: Stand pivot Chair/bed transfer assist level: Supervision or verbal cues Chair/bed transfer assistive device: Armrests, Medical sales representative Ambulation activity did not occur: Safety/medical concerns   Max distance: 150 Assist level: Touching or steadying assistance (Pt > 75%)   Wheelchair   Type: Manual Max wheelchair distance: 215 Assist Level: Supervision or verbal cues  Cognition Comprehension Comprehension assist level: Understands basic 90% of the time/cues < 10% of the time  Expression Expression assist level: Expresses basic 90% of the time/requires cueing < 10% of the time.  Social Interaction Social Interaction assist level: Interacts appropriately 90% of the time - Needs monitoring or encouragement for participation or interaction.  Problem Solving Problem solving assist level: Solves basic 90% of the time/requires cueing < 10% of the time  Memory Memory assist level: Recognizes or recalls 90% of the time/requires cueing < 10% of the time   Medical Problem List and Plan: 1.Decreased functional mobilitysecondary to cervical stenosis with cord compression, lumbar  spondylosis with radiculopathy, left temporal lobe infarction.Patient is to follow-up outpatient with Dr. Kathyrn Sheriff to discuss possible surgical interventions for lumbar radiculopathy  -CIR PT OT, team conf today 2. DVT Prophylaxis/Anticoagulation: Xarelto.  CBC normal 8/19 3. Pain Management:Robaxin 500 mg 3 times daily, oxycodone as needed 4. Mood:Provide emotional support 5. Neuropsych: This patientiscapable of making decisions on hisown behalf. 6. Skin/Wound Care:Routine skin checks 7. Fluids/Electrolytes/Nutrition:I personally reviewed the patient's labs today.   8.Persistent atrial fibrillation. Cardiac rate controlled. Amiodarone 200 mg twice daily,Toprol 100mg  daily Vitals:   01/09/18 1928 01/10/18 0430  BP: 124/87 119/72  Pulse: 88 74  Resp: 18 18  Temp: 98.3 F (36.8 C) 97.6 F (36.4 C)  SpO2: 94% 94%  Rate is controlled 8/20 9.Chronic systolic and diastolic congestive heart failure. Monitor for any signs of fluid overload. Lasix 40 mg Daily  - Filed Weights   01/08/18 0419 01/09/18 0512 01/10/18 0430  Weight: (!) 159.4 kg (!) 154.7 kg (!) 160.9 kg    -weights inconsistent, baseline weight appears to be 160kg 10.LLLpneumonia. Follow-up chest x-ray 01/03/2018 with no acute process. Complete course of Omnicef and doxycycline 11. Diabetes mellitus with peripheral neuropathy. Hemoglobin A1c 8.4. SSI. Check blood sugars before meals and at bedtime. Patient on Glucophage 1000 mg twice daily prior to admission currently on hold due to renal insufficiency  - CBG (last 3)  Recent Labs    01/09/18 1637 01/09/18 2101 01/10/18 0640  GLUCAP 135* 116* 131*  Well-controlled 8/20 12.CKD stage III. Cr sl elevated 1.9, recheck  later this week 13.Hyperlipidemia. Lovaza/Lipitor 14.Morbid obesity. BMI 51.99. Dietary follow-up 15.Constipation. Laxative assistance 16. Gout left great toe: improved off steroids, still some pain  --push fluids,  dietary considerations discussed   LOS (Days) 6 A FACE TO FACE EVALUATION WAS PERFORMED  Meredith Staggers, MD 01/10/2018 8:44 AM

## 2018-01-10 NOTE — Consult Note (Signed)
Neuropsychological Consultation   Patient:   Jay Chen   DOB:   September 19, 1965  MR Number:  [id number]  Location:  [facility] [id number] [place] [address] Dept: [place]: [phone]           Date of Service:   01/10/2018  Start Time:   1 PM End Time:   2 PM  Provider/Observer:  Ilean Skill, Psy.D.       Clinical Neuropsychologist       Billing Code/Service: [phone] 4 Units  Chief Complaint:    Jay Chen is a 52 year old male who has a history of diastolic/systolic congestive heart failure, chronic kidney disease, history of thoracic aneurysm, diabetes, hypertension, hyperlipidemia, and morbid obesity/OSA.  The patient presented on 12/28/2017 with right-sided flank pain and bilateral lower extremity weakness.  MRI of the brain showed a small acute posterior temporal lobe infarction.  MRI lumbar thoracic cervical spine showed L4-5 broad-based disc bulge.  L5-S1 with moderate right facet arthropathy.  MRI of cervical spine with C4-5 severe cord compression due to disc protrusion with cord edema.  The patient was treated with Dosepak for severe cord compression with no surgical intervention at this time but a plan to address as outpatient.  The patient was referred for the comprehensive rehabilitation unit to facilitate improvement in functional status.  Reason for Service:  The patient was referred for neuropsychological consultation due to coping and adjustment issues due to long hospital stay and the development of debility and weakness.  Below is the HPI for the current admission.  Jay Chen a 52 year old right-handed male with history of diastolic/systolic congestive heart failure, CKD stage III, history of thoracic aneurysm, diabetes mellitus, hypertension, hyperlipidemia, morbid obesity/OSA. Presented 12/28/2017 with right side flank pain and bilateral lower  extremity weakness. Per chart review patient lives with spouse and children ages 65 and 81. Reported to be independent driving prior to admission and currently unemployed. Two-level home with bedroom on Main level and 3 steps to entry. Wife works during the day. Mother can check on him as needed. Chest x-ray showed mild vascular congestion and borderline cardiomegaly as well as suspected opacity at the left lung base could not exclude pneumonia and was placed on Rocephin as well as doxycycline. CT renal stone study no acute abnormality seen. There was a 2 mm nonobstructing stone at the lower pole of the right kidney. Ultrasound aorta without abdominal aortic aneurysm noted. MRI of the brain showed small acute posterior temporal lobe infarction. MRI lumbar thoracic cervical spine showed L4-5 broad-based disc bulge. Moderate bilateral facet arthropathy with facet effusions. L5-S1 with moderate right facet arthropathy. MRI cervical spine with C4-5 severe cord compression due to disc protrusion with cord edema. Echocardiogram with ejection fraction 25%. Systolic function severely reduced. Carotid Dopplers with no ICA stenosis. Close monitoring of renal function 2.31-2.72. Cardiology services consulted in regards to CHF as well as persistent atrial fibrillation remained on Coreg as well as Lasix and Lopressor. Xarelto was added for atrial fibrillation with cardiac rate controlled.There was question of left lower lobe pneumonia per chest x-ray with follow-up chest x-ray completed after broad-spectrum antibiotics currently completing course of Omnicef and doxycycline. Neurology consulted and follow-upinitiallymaintained on prednisone Dosepak for severe spinal cord compression with lumbar radiculopathy and no plan for surgical intervention at this time per Dr. Elizebeth Brooking plan to address as outpatient. Physical and occupational therapy evaluations completed with recommendations of physical  medicine rehab consult. Patient was admitted for a comprehensive rehab program.  Current Status:  The patient reports that he is feeling a little bit stronger and has been working on physical and occupational therapeutic interventions.  The patient reports that he is continuing to have pain in his right hip.  The patient knows that he has a lot of issues to address after he regains his strength as he knows he will have outpatient procedures done regarding his central cord compression and issues in his lower back.  Behavioral Observation: Jay Chen  presents as a 52 y.o.-year-old Right African American Male who appeared his stated age. his dress was Appropriate and he was Well Groomed and his manners were Appropriate to the situation.  his participation was indicative of Appropriate and Attentive behaviors.  There were any physical disabilities noted.  he displayed an appropriate level of cooperation and motivation.     Interactions:    Active Appropriate and Attentive  Attention:   within normal limits and attention span and concentration were age appropriate  Memory:   within normal limits; recent and remote memory intact  Visuo-spatial:  within normal limits  Speech (Volume):  normal  Speech:   normal; normal  Thought Process:  Coherent and Relevant  Though Content:  WNL; not suicidal and not homicidal  Orientation:   person, place, time/date and situation  Judgment:   Fair  Planning:   Fair  Affect:    Flat  Mood:    Dysphoric  Insight:   Fair  Intelligence:   normal  Medical History:   Past Medical History:  Diagnosis Date  . CKD (chronic kidney disease), stage III ([place])   . Congestive heart failure [facility]) May of 2011   Felt to have cor pulmonale; EF 45 to 50% from echo in May 2011  . Cor pulmonale (chronic) ([place])   . Gout    "take daily RX" (12/28/2017)  . Hyperlipidemia   . Hypertension   . Morbid obesity ([place])   . Persistent atrial fibrillation ([place])     [name] 12/28/2017  . Sleep apnea    "dx'd; couldn't tolerate CPAP" (12/28/2017)  . Thoracic aneurysm    a. 4.8cm thoracic aortic aneurysm by CT 2013.  [name] Thoracic aortic aneurysm ([place])    known/notes 12/28/2017  . Type II diabetes mellitus ([place])     Family Med/Psych History:  Family History  Problem Relation Age of Onset  . Hypertension Mother   . Pancreatic cancer Father   . Prostate cancer Father   . Colon cancer Father     Risk of Suicide/Violence: virtually non-existent   Impression/DX:  Jay Chen is a 52 year old male who has a history of diastolic/systolic congestive heart failure, chronic kidney disease, history of thoracic aneurysm, diabetes, hypertension, hyperlipidemia, and morbid obesity/OSA.  The patient presented on 12/28/2017 with right-sided flank pain and bilateral lower extremity weakness.  MRI of the brain showed a small acute posterior temporal lobe infarction.  MRI lumbar thoracic cervical spine showed L4-5 broad-based disc bulge.  L5-S1 with moderate right facet arthropathy.  MRI of cervical spine with C4-5 severe cord compression due to disc protrusion with cord edema.  The patient was treated with Dosepak for severe cord compression with no surgical intervention at this time but a plan to address as outpatient.  The patient was referred for the comprehensive rehabilitation unit to facilitate improvement in functional status.  The patient reports that he is feeling a little bit stronger and has  been working on physical and occupational therapeutic interventions.  The patient reports that he is continuing to have pain in his right hip.  The patient knows that he has a lot of issues to address after he regains his strength as he knows he will have outpatient procedures done regarding his central cord compression and issues in his lower back.   Disposition/Plan:  Today we worked on coping and adjustment issues and reviewed issues as far as the long-term plan for what he  needs to do as far as rehab and what he will need to continue to do once he is discharged.  The patient reports that he was met motivated to complete therapeutic interventions but the question will be how much he follows up with this following discharge.  The patient needs to address these medical issues before any surgical interventions.         Electronically Signed   _______________________ Ilean Skill, Psy.D.

## 2018-01-10 NOTE — Progress Notes (Signed)
Occupational Therapy Session Note  Patient Details  Name: Jay Chen MRN: [id number] Date of Birth: 19-Sep-1965  Today's Date: 01/10/2018 OT Individual Time: 2993-7169 OT Individual Time Calculation (min): 73 min    Short Term Goals: Week 1:  OT Short Term Goal 1 (Week 1): Pt will perform sit <> stand with max +1 OT Short Term Goal 2 (Week 1): Pt will thread LB clothing over BLE with (S) OT Short Term Goal 3 (Week 1): Pt will perform 1 grooming task in standing OT Short Term Goal 4 (Week 1): Pt will perform stand pivot transfer to w/c with max A  Skilled Therapeutic Interventions/Progress Updates:    Upon entering the room, pt supine in bed with no c/o pain this session. Pt agreeable to OT intervention. Pt performed supine >sit with supervision and ambulating to wheelchair with RW and steady assistance. Pt seated in wheelchair at sink for grooming tasks at mod I level. Pt ambulating with RW to dresser, to obtain clothing items ,with steady assistance when squatting to obtain clothing. Pt required min A for standing balance during LB clothing management. Pt ambulating 150' towards ADL room with RW and steady assistance. Pt taking seated rest break secondary to fatigue and then utilized B UEs and LEs 75' to room with increased time. OT educated and demonstrated use of RW for kitchen mobility, kitchen set up, and safety overall. Pt verbalized understanding. OT providing pt with paper handout for energy conservation with ADL and IADL tasks. Pt propelling wheelchair in same manner back to room. Transfer from wheelchair >recliner chair with steady assistance with stand pivot. Call bell and all needed items within reach.   Therapy Documentation Precautions:  Precautions Precautions: Fall Restrictions Weight Bearing Restrictions: No ADL: ADL ADL Comments: Please see functional navigator  See Function Navigator for Current Functional Status.   Therapy/Group: Individual Therapy  Gypsy Decant 01/10/2018, 9:28 AM

## 2018-01-10 NOTE — Progress Notes (Signed)
Physical Therapy Session Note  Patient Details  Name: Jay Chen MRN: [id number] Date of Birth: [birth year]  Today's Date: 01/10/2018 PT Individual Time: 1000-1055 PT Individual Time Calculation (min): 55 min   Short Term Goals: Week 1:  PT Short Term Goal 1 (Week 1): Pt will perform all bed mobility with CGA  PT Short Term Goal 2 (Week 1): Pt will perform STS with mod A and LRAD  PT Short Term Goal 3 (Week 1): Pt will perform transfers with mod A and LRAD  PT Short Term Goal 4 (Week 1): Pt will ambulate 15 feet with LRAD and assist   Skilled Therapeutic Interventions/Progress Updates:    Pt seated in recliner upon PT arrival, agreeable to therapy tx and reports pain 5/10 in L hip when ambulating. Pt ambulated into the bathroom with RW and supervision, performed all toileting with supervision and washed hands at sink. Pt ambulated x 100 ft using RW and CGA, limited by hip pain and had to take seated rest break. Pt propelled w/c the rest of the way to the gym with supervision x 60 ft. Pt participated in berg balance test as detailed below, scored 37/56 and dicussed results with the patient including areas for improvement. Pt worked on dynamic balance and hip strength to perform side steps over target without AD. Pt ambulated to mat with RW and CGA. Pt performed sit<>stands from elevated mat without UE support for LE strengthening, min assist. Pt ambulated to parallel bars and performed standing hip abduction 10 on each LE. Pt transported back to room and therapist applied ice for acute L hip pain. Pt left in recliner with needs in reach.   Therapy Documentation Precautions:  Precautions Precautions: Fall Restrictions Weight Bearing Restrictions: No   Balance: Standardized Balance Assessment Standardized Balance Assessment: Berg Balance Test Berg Balance Test Sit to Stand: Able to stand  independently using hands Standing Unsupported: Able to stand safely 2 minutes Sitting with Back  Unsupported but Feet Supported on Floor or Stool: Able to sit safely and securely 2 minutes Stand to Sit: Sits safely with minimal use of hands Transfers: Able to transfer safely, definite need of hands Standing Unsupported with Eyes Closed: Able to stand 10 seconds with supervision Standing Ubsupported with Feet Together: Needs help to attain position but able to stand for 30 seconds with feet together From Standing, Reach Forward with Outstretched Arm: Can reach forward >12 cm safely (5") From Standing Position, Pick up Object from Floor: Able to pick up shoe, needs supervision From Standing Position, Turn to Look Behind Over each Shoulder: Looks behind from both sides and weight shifts well Turn 360 Degrees: Able to turn 360 degrees safely but slowly Standing Unsupported, Alternately Place Feet on Step/Stool: Needs assistance to keep from falling or unable to try Standing Unsupported, One Foot in Front: Able to take small step independently and hold 30 seconds Standing on One Leg: Tries to lift leg/unable to hold 3 seconds but remains standing independently Total Score: 37   See Function Navigator for Current Functional Status.   Therapy/Group: Individual Therapy  Netta Corrigan, PT, DPT 01/10/2018, 10:36 AM

## 2018-01-10 NOTE — Progress Notes (Signed)
Physical Therapy Session Note  Patient Details  Name: Jay Chen MRN: [id number] Date of Birth: 12/07/1965  Today's Date: 01/10/2018 PT Individual Time: 1530-1630 PT Individual Time Calculation (min): 60 min   Short Term Goals: Week 1:  PT Short Term Goal 1 (Week 1): Pt will perform all bed mobility with CGA  PT Short Term Goal 2 (Week 1): Pt will perform STS with mod A and LRAD  PT Short Term Goal 3 (Week 1): Pt will perform transfers with mod A and LRAD  PT Short Term Goal 4 (Week 1): Pt will ambulate 15 feet with LRAD and assist   Skilled Therapeutic Interventions/Progress Updates:    Pt reports 3/10 aching pain to L hip prior to session and therapist monitored pain levels throughout session. Pt performs all stand pivot transfers and STS with supervision using RW. Pt propels w/c from room> outside >500 feet using B UE's for endurance and strengthening with supervision and x1 rest break. Pt performs ambulation for 100 feet on uneven surfaces incoporating ascending/descending curb with RW and therapist providing supervision with Pt able to demonstrate good safety awareness with community reintegration. Pt then performs ambulatory transfer with RW for 25 feet to stairs and ascends/ descends 2x4 steps with single HR with therapist providing supervision with intermittent CGA for strengthening during functional mobility. Pt then performs anterograde/retrograde ambulation for 25 feet with RW for inc challenge and supervision. Pt performs 3x 1 min duration of alternating marching using RW for UE support and supervision for strengthening. Pt requires seated rest break in between trials 2/2 fatigue. Pt then ambulates >500 feet with RW from outside> room with supervision and performs ambulatory transfer to recliner. Pt left in recliner with tray table and call bell in reach and all needs met.  Therapy Documentation Precautions:  Precautions Precautions: Fall Restrictions Weight Bearing  Restrictions: No   See Function Navigator for Current Functional Status.   Therapy/Group: Individual Therapy  Floreen Comber 01/10/2018, 4:31 PM

## 2018-01-11 ENCOUNTER — Inpatient Hospital Stay (HOSPITAL_COMMUNITY): Payer: BC Managed Care – PPO | Admitting: Physical Therapy

## 2018-01-11 ENCOUNTER — Inpatient Hospital Stay (HOSPITAL_COMMUNITY): Payer: BC Managed Care – PPO

## 2018-01-11 ENCOUNTER — Inpatient Hospital Stay (HOSPITAL_COMMUNITY): Payer: BC Managed Care – PPO | Admitting: Occupational Therapy

## 2018-01-11 LAB — GLUCOSE, CAPILLARY
GLUCOSE-CAPILLARY: 110 mg/dL — AB (ref 70–99)
Glucose-Capillary: 148 mg/dL — ABNORMAL HIGH (ref 70–99)
Glucose-Capillary: 131 mg/dL — ABNORMAL HIGH (ref 70–99)
Glucose-Capillary: 123 mg/dL — ABNORMAL HIGH (ref 70–99)

## 2018-01-11 NOTE — Progress Notes (Signed)
Physical Therapy Session Note  Patient Details  Name: Jay Chen MRN: [id number] Date of Birth: [birth year]  Today's Date: 01/11/2018 PT Individual Time: 1530-1600 PT Individual Time Calculation (min): 30 min   Short Term Goals: Week 1:  PT Short Term Goal 1 (Week 1): Pt will perform all bed mobility with CGA  PT Short Term Goal 2 (Week 1): Pt will perform STS with mod A and LRAD  PT Short Term Goal 3 (Week 1): Pt will perform transfers with mod A and LRAD  PT Short Term Goal 4 (Week 1): Pt will ambulate 15 feet with LRAD and assist   Skilled Therapeutic Interventions/Progress Updates:   neuromuscular re-education via forced use, multimodal cues for alance challenge and sustained stretch bil heel cords, standing on blue wedge with bil UE support fading to 0UE support, x 30 seconds, x 1 minute.  With bil UE support, 12 x 1 each hip abduction focusing on trunk extension, neutral hip alignment.   In standing, given external perturbations from all directions, pt demonstrated bil ankle strategy, delayed bil hip strategy.  Gait training with bariatric RW on level tile x 150' , x 2, with close supervision.  Pt left resting in w/c with needs at hand, using call bell to ask for assistance to BR.     Therapy Documentation Precautions:  Precautions Precautions: Fall Restrictions Weight Bearing Restrictions: No   Pain: 4/10 low back, premedicated    See Function Navigator for Current Functional Status.   Therapy/Group: Individual Therapy  Aleira Deiter 01/11/2018, 4:14 PM

## 2018-01-11 NOTE — Progress Notes (Signed)
Physical Therapy Session Note  Patient Details  Name: Jay Chen MRN: [id number] Date of Birth: [birth year]  Today's Date: 01/11/2018 PT Individual Time: 1000-1030 &1415-1515 PT Individual Time Calculation (min): 30 min & 60 min  Short Term Goals: Week 1:  PT Short Term Goal 1 (Week 1): Pt will perform all bed mobility with CGA  PT Short Term Goal 2 (Week 1): Pt will perform STS with mod A and LRAD  PT Short Term Goal 3 (Week 1): Pt will perform transfers with mod A and LRAD  PT Short Term Goal 4 (Week 1): Pt will ambulate 15 feet with LRAD and assist   Skilled Therapeutic Interventions/Progress Updates:   Session 1:  Pt reports 6/10 LBP prior to session start and therapist monitors pain levels throughout session. Pt performs all stand pivot transfers and STS with supervision using RW or quad cane. Pt ambulates 200 feet from room>gym using RW with supervision. Pt ambulates 100 feet with quad cane with therapist providing CGA and verbal cues for correct sequencing and Pt demonstrating good carry over. Pt performs 1x10 STS from couch to simulate home environment & LE strengthening using quad cane for steadying in standing position and therapist providing supervision. Pt performs ambulatory transfer from couch>toilet, 10 feet, with for voiding using quad cane and CGA for steadying. Pt then ambulates 100 feet from apartment towards room using quad cane with CGA and therapist providing sequencing. Pt then required seated rest break 2/2 fatigue and therapist propelled Pt in w/c remaining way for time management. Pt left in chair with heat applied to lower back for pain and call bell and tray table in reach and all needs met.  Session 2: Pt reports 4/10 LBP prior to session start and therapist monitored pain throughout session. Pt performs ambulatory transfer from w/c>toilet with RW and supervision for voiding. Pt then ambulates from room>gym for 200 feet using quad cane and CGA demonstrating correct  sequencing with good carryover from morning session. PT demonstrates and verbalizes fall recovery with Pt able to demonstrate and verbalize understanding for x1 trial with CGA. Therapist educates Pt about safety considerations if a fall were to occur in the home environment. Pt then ambulates from ortho gym> treatment gym for 75 ft with quad cane and CGA. Pt performs x2 trials of standing balance with dynamic reaching with B UE's to retrieve and place cards in designated location with CGA throughout trials. Pt requires seated rest break in between 2/2 fatigue. Pt ambulates 100 feet with quad cane and CGA progressing to intermittent supervision in direction of room and therapist propels Pt in w/c remainder of way 2/2 fatigue. Pt left sitting in chair with tray table and call bell in reach and all needs met.  Therapy Documentation Precautions:  Precautions Precautions: Fall Restrictions Weight Bearing Restrictions: No   See Function Navigator for Current Functional Status.   Therapy/Group: Individual Therapy  Floreen Comber 01/11/2018, 10:17 AM

## 2018-01-11 NOTE — Progress Notes (Signed)
[place] PHYSICAL MEDICINE & REHABILITATION     PROGRESS NOTE    Subjective/Complaints: Up with OT in bathroom. Has some back pain. Otherwise doing well  ROS: Patient denies fever, rash, sore throat, blurred vision, nausea, vomiting, diarrhea, cough, shortness of breath or chest pain, joint or back pain, headache, or mood change.    Objective:  No results found. Recent Labs    01/09/18 0640  WBC 9.6  HGB 14.0  HCT 43.1  PLT 388   Recent Labs    01/09/18 0640  NA 141  K 4.1  CL 99  GLUCOSE 142*  BUN 53*  CREATININE 1.94*  CALCIUM 9.1   CBG (last 3)  Recent Labs    01/10/18 1628 01/10/18 2201 01/11/18 0649  GLUCAP 111* 125* 148*    Wt Readings from Last 3 Encounters:  01/11/18 (!) 159.3 kg  01/04/18 (!) 155.7 kg  11/22/17 (!) 164.7 kg     Intake/Output Summary (Last 24 hours) at 01/11/2018 0749 Last data filed at 01/11/2018 0411 Gross per 24 hour  Intake 960 ml  Output 725 ml  Net 235 ml    Vital Signs: Blood pressure 129/89, pulse 79, temperature 97.6 F (36.4 C), temperature source Oral, resp. rate 18, weight (!) 159.3 kg, SpO2 93 %. Physical Exam:  Constitutional: No distress . Vital signs reviewed. HEENT: EOMI, oral membranes moist Neck: supple Cardiovascular: RRR without murmur. No JVD    Respiratory: CTA Bilaterally without wheezes or rales. Normal effort    GI: BS +, non-tender, non-distended  Neurological: He isalertand oriented to person, place, and time. Follows full commands.  Skin: Skin iswarmand dry. Motor strength is 4-/5 bilateral hip flexor, 4/5 knee extensor ankle dorsiflexor 5/5 bilateral deltoid bicep tricep grip Sensation intact to light touch bilateral upper and lower limb--motor and sensory stable Musc: mild lateral hip pain left, LB tender Psych: pleasant   Assessment/Plan: 1. Functional deficits secondary to cervical cord compression, lumbar stenosis/radic, left temporal lobe infarct which require 3+ hours per  day of interdisciplinary therapy in a comprehensive inpatient rehab setting. Physiatrist is providing close team supervision and 24 hour management of active medical problems listed below. Physiatrist and rehab team continue to assess barriers to discharge/monitor patient progress toward functional and medical goals.  Function:  Bathing Bathing position Bathing activity did not occur: Refused Position: Production manager parts bathed by patient: Right arm, Left arm, Chest, Front perineal area, Abdomen, Right upper leg, Left upper leg, Right lower leg, Left lower leg, Buttocks, Back Body parts bathed by helper: Buttocks, Back  Bathing assist Assist Level: Touching or steadying assistance(Pt > 75%), Assistive device Assistive Device Comment: LH sponge    Upper Body Dressing/Undressing Upper body dressing   What is the patient wearing?: Pull over shirt/dress     Pull over shirt/dress - Perfomed by patient: Thread/unthread right sleeve, Put head through opening, Thread/unthread left sleeve, Pull shirt over trunk          Upper body assist Assist Level: Supervision or verbal cues   Set up : To obtain clothing/put away  Lower Body Dressing/Undressing Lower body dressing   What is the patient wearing?: Pants, Shoes, [employer], Underwear Underwear - Performed by patient: Thread/unthread left underwear leg, Thread/unthread right underwear leg, Pull underwear up/down Underwear - Performed by helper: Thread/unthread right underwear leg, Pull underwear up/down Pants- Performed by patient: Thread/unthread left pants leg, Thread/unthread right pants leg, Pull pants up/down Pants- Performed by helper: Thread/unthread right pants leg  Non-skid slipper socks- Performed by helper: Don/doff right sock, Don/doff left sock Socks - Performed by patient: Don/doff right sock, Don/doff left sock   Shoes - Performed by patient: Don/doff right shoe, Fasten right, Fasten left, Don/doff left  shoe Shoes - Performed by helper: Don/doff right shoe, Don/doff left shoe, Fasten right, Fasten left       TED Hose - Performed by helper: Don/doff right TED hose, Don/doff left TED hose  Lower body assist Assist for lower body dressing: Set up, Touching or steadying assistance (Pt > 75%)   Set up : Don/doff TED stockings  Toileting Toileting Toileting activity did not occur: No continent bowel/bladder event Toileting steps completed by patient: Adjust clothing prior to toileting, Performs perineal hygiene, Adjust clothing after toileting Toileting steps completed by helper: Adjust clothing prior to toileting, Performs perineal hygiene, Adjust clothing after toileting Toileting Assistive Devices: Grab bar or rail  Toileting assist Assist level: Touching or steadying assistance (Pt.75%)   Transfers Chair/bed transfer Chair/bed transfer activity did not occur: Refused Chair/bed transfer method: Stand pivot, Ambulatory Chair/bed transfer assist level: Supervision or verbal cues Chair/bed transfer assistive device: Armrests, Environmental manager lift: Stedy(per PT documentation)   Locomotion Ambulation Ambulation activity did not occur: Safety/medical concerns   Max distance: >500 Assist level: Supervision or verbal cues   Wheelchair   Type: Manual Max wheelchair distance: >500 Assist Level: Supervision or verbal cues  Cognition Comprehension Comprehension assist level: Understands basic 90% of the time/cues < 10% of the time  Expression Expression assist level: Expresses basic 90% of the time/requires cueing < 10% of the time.  Social Interaction Social Interaction assist level: Interacts appropriately 90% of the time - Needs monitoring or encouragement for participation or interaction.  Problem Solving Problem solving assist level: Solves basic 90% of the time/requires cueing < 10% of the time  Memory Memory assist level: Recognizes or recalls 90% of the time/requires cueing < 10% of  the time   Medical Problem List and Plan: 1.Decreased functional mobilitysecondary to cervical stenosis with cord compression, lumbar spondylosis with radiculopathy, left temporal lobe infarction.Patient is to follow-up outpatient with Dr. Kathyrn Sheriff to discuss possible surgical interventions for lumbar radiculopathy  -CIR PT OT, continues to make functional gains 2. DVT Prophylaxis/Anticoagulation: Xarelto.   3. Pain Management:Robaxin 500 mg 3 times daily, oxycodone as needed  -add kpad for back 4. Mood:Provide emotional support 5. Neuropsych: This patientiscapable of making decisions on hisown behalf. 6. Skin/Wound Care:Routine skin checks 7. Fluids/Electrolytes/Nutrition:I personally reviewed the patient's labs today.   8.Persistent atrial fibrillation. Cardiac rate controlled. Amiodarone 200 mg twice daily,Toprol 100mg  daily Vitals:   01/10/18 1943 01/11/18 0409  BP: 119/85 129/89  Pulse: 83 79  Resp: 16 18  Temp: 98 F (36.7 C) 97.6 F (36.4 C)  SpO2: 93% 93%  Rate is controlled 8/21 9.Chronic systolic and diastolic congestive heart failure. Monitor for any signs of fluid overload. Lasix 40 mg Daily  - Filed Weights   01/09/18 0512 01/10/18 0430 01/11/18 0409  Weight: (!) 154.7 kg (!) 160.9 kg (!) 159.3 kg    -weights stable 10.LLLpneumonia. Follow-up chest x-ray 01/03/2018 with no acute process. Complete course of Omnicef and doxycycline 11. Diabetes mellitus with peripheral neuropathy. Hemoglobin A1c 8.4. SSI. Check blood sugars before meals and at bedtime. Patient on Glucophage 1000 mg twice daily prior to admission currently on hold due to renal insufficiency  - CBG (last 3)  Recent Labs    01/10/18 1628 01/10/18 2201 01/11/18 8127  GLUCAP  111* 125* 148*  Well-controlled 8/21 12.CKD stage III. Cr sl elevated 1.9, recheck later this week 13.Hyperlipidemia. Lovaza/Lipitor 14.Morbid obesity. BMI 51.99. Dietary  follow-up 15.Constipation. Laxative assistance 16. Gout left great toe: improved off steroids, still some pain  --push fluids, dietary considerations discussed   LOS (Days) 7 A FACE TO FACE EVALUATION WAS PERFORMED  Meredith Staggers, MD 01/11/2018 7:49 AM

## 2018-01-11 NOTE — Progress Notes (Signed)
Occupational Therapy Session Note  Patient Details  Name: Jay Chen MRN: [id number] Date of Birth: Dec 24, 1965  Today's Date: 01/11/2018 OT Individual Time: 0700-0810 OT Individual Time Calculation (min): 70 min    Short Term Goals: Week 1:  OT Short Term Goal 1 (Week 1): Pt will perform sit <> stand with max +1 OT Short Term Goal 2 (Week 1): Pt will thread LB clothing over BLE with (S) OT Short Term Goal 3 (Week 1): Pt will perform 1 grooming task in standing OT Short Term Goal 4 (Week 1): Pt will perform stand pivot transfer to w/c with max A  Skilled Therapeutic Interventions/Progress Updates:    Upon entering the room, pt sleeping soundly in bed but agreeable to OT intervention. Pt with c/o 5/10 back spasms with RN notified and medication given during session. Pt requesting to shower this session. Pt ambulating to bathroom with RW and steady assistance for toileting needs with supervision for toilet transfer and steady assistance for balance with LB clothing management. Pt cued to remove clothing items while seated on toilet for safety before transferring to TTB. Steady assistance for transfer onto TTB and pt standing to wash buttocks with close supervision while holding onto grab bar. Pt exiting the bathroom to sit on EOB for dressing tasks.Pt taking rest break secondary to fatigue. Pt able to utilize figure four position to don B shoes with total A for TED hose. Pt transferred into wheelchair at end of session and plan made for next OT session in regards to discharge planning and pt goals. Call bell and all needed items within reach upon exiting the room.    Therapy Documentation Precautions:  Precautions Precautions: Fall Restrictions Weight Bearing Restrictions: No Pain: Pain Assessment Pain Score: 5  Pain Type: Acute pain Pain Location: Back Pain Descriptors / Indicators: Aching Pain Intervention(s): Medication (See eMAR) ADL: ADL ADL Comments: Please see functional  navigator  See Function Navigator for Current Functional Status.   Therapy/Group: Individual Therapy  Gypsy Decant 01/11/2018, 8:13 AM

## 2018-01-12 ENCOUNTER — Inpatient Hospital Stay (HOSPITAL_COMMUNITY): Payer: BC Managed Care – PPO | Admitting: Physical Therapy

## 2018-01-12 ENCOUNTER — Inpatient Hospital Stay (HOSPITAL_COMMUNITY): Payer: BC Managed Care – PPO | Admitting: Occupational Therapy

## 2018-01-12 LAB — GLUCOSE, CAPILLARY
Glucose-Capillary: 135 mg/dL — ABNORMAL HIGH (ref 70–99)
Glucose-Capillary: 97 mg/dL (ref 70–99)
Glucose-Capillary: 110 mg/dL — ABNORMAL HIGH (ref 70–99)
Glucose-Capillary: 133 mg/dL — ABNORMAL HIGH (ref 70–99)

## 2018-01-12 NOTE — Patient Care Conference (Signed)
Inpatient RehabilitationTeam Conference and Plan of Care Update Date: 01/10/2018   Time: 2:15 PM    Patient Name: Jay Chen      Medical Record Number: [id number]  Date of Birth: 12-Nov-1965 Sex: Male         Room/Bed: 4W01C/4W01C-01 Payor Info: Payor: [place] / Plan: [place] PPO / Product Type: *No Product type* /    Admitting Diagnosis: cervical stenosis  lumbar radic  left CVA  Admit Date/Time:  01/04/2018  4:34 PM Admission Comments: No comment available   Primary Diagnosis:  <principal problem not specified> Principal Problem: <principal problem not specified>  Patient Active Problem List   Diagnosis Date Noted  . Radiculopathy 01/04/2018  . Debility   . Atrial fibrillation, chronic ([place])   . Cerebral embolism with cerebral infarction 01/01/2018  . Acute decompensated heart failure ([place])   . Lumbar radiculopathy   . Cervical spinal cord compression ([place])   . PNA (pneumonia) 12/30/2017  . DCM (dilated cardiomyopathy) ([place])   . Atrial fibrillation with rapid ventricular response ([place])   . Chronic combined systolic and diastolic heart failure ([place])   . AKI (acute kidney injury) ([place]) 12/28/2017  . Flank pain   . Chronic combined systolic (congestive) and diastolic (congestive) heart failure ([place])   . Chronic systolic heart failure ([place]) 11/22/2017  . Bilateral lower extremity edema 10/21/2017  . Pure hyperglyceridemia 09/01/2017  . Persistent atrial fibrillation ([place]) 09/01/2017  . Phimosis/redundant prepuce 06/03/2015  . Hypokalemia 07/18/2013  . Gout of big toe 09/11/2012  . Hyperlipidemia with target LDL less than 100 09/11/2012  . Diabetes mellitus type 2, controlled, with complications ([place]) 93/81/8299  . Routine general medical examination at a health care facility 01/19/2012  . Cor pulmonale ([place]) 01/08/2011  . HTN (hypertension) 01/08/2011  . Obesities, morbid ([place]) 01/08/2011  . Aneurysm of thoracic aorta ([place]) 01/08/2011  .  Obstructive sleep apnea 08/27/2007  . RHINITIS 08/18/2007    Expected Discharge Date: Expected Discharge Date: 01/17/18  Team Members Present: Physician leading conference: Dr. Alger Simons Social Worker Present: Alfonse Alpers, LCSW Nurse Present: Rayetta Pigg, RN PT Present: Dwyane Dee, PT OT Present: Benay Pillow, OT SLP Present: Charolett Bumpers, SLP PPS Coordinator present : Daiva Nakayama, RN, CRRN     Current Status/Progress Goal Weekly Team Focus  Medical   Patient with debility and functional deficits related to cervical stenosis lumbar spondylosis with radiculopathy and CVA  Maximize medical management to allow increased dissipation with therapy  Volume control, blood pressure control, pain management including treatment of gout and back pain   Bowel/Bladder   (P) Continent of B&b  (P) Patient remain continent of B&B   (P) Assess Bowel and Bladder q shift and assist with toileting as needed   Swallow/Nutrition/ Hydration             ADL's   Steady assist BADLs at shower level, steady assist functional bathroom transfers using RW at ambulatory level   Supervision/cuing overall, supervision/setup bathing   Balance, pt/family education, functional transfers, general strengthening + endurance    Mobility   150 feet CGA with RW; transfers SPV; Stairs x8 6 inch steps with B HR's and CGA  supervision overall  functional mobility, strengthening, balance, community reintegration , endurance   Communication             Safety/Cognition/ Behavioral Observations            Pain   (P) Complains of rt foot and  hip pain; prn Oxy give as requested  (P) ,< or =2  Assess and treat pain q shift and as needed   Skin   No skin issue noted  Skin to be free of breakdown/infection with min assist  Assess and address any skin issues q shift and as needed    Rehab Goals Patient on target to meet rehab goals: Yes Rehab Goals Revised: none *See Care Plan and progress notes for  long and short-term goals.     Barriers to Discharge  Current Status/Progress Possible Resolutions Date Resolved   Physician    Medical stability;Wound Care        See medical progress notes      Nursing                  PT                    OT                  SLP                SW                Discharge Planning/Teaching Needs:  Pt to return to his home where either his wife or mother will provide supervision.  Family education this week prior to pt's d/c 01-17-18.   Team Discussion:  Pt with spinal stenosis.lumbar stenosis, and stroke.  He is deconditioned, but is making progress.  Dr. Naaman Plummer is watching pt's blood sugars, blood pressure, and weights.  Pt is doing well with nursing and therapy.  He is min guard with rolling walker and can do steps with two rails.  His transfers are supervision and goals are supervision overall.  Pt needs steady assist with self care.  Revisions to Treatment Plan:  none    Continued Need for Acute Rehabilitation Level of Care: The patient requires daily medical management by a physician with specialized training in physical medicine and rehabilitation for the following conditions: Daily direction of a multidisciplinary physical rehabilitation program to ensure safe treatment while eliciting the highest outcome that is of practical value to the patient.: Yes Daily medical management of patient stability for increased activity during participation in an intensive rehabilitation regime.: Yes Daily analysis of laboratory values and/or radiology reports with any subsequent need for medication adjustment of medical intervention for : Neurological problems;Post surgical problems  Justin Buechner, Silvestre Mesi 01/12/2018, 12:17 AM

## 2018-01-12 NOTE — Progress Notes (Signed)
Occupational Therapy Session Note  Patient Details  Name: Jay Chen MRN: [id number] Date of Birth: Jan 16, 1966  Today's Date: 01/12/2018 OT Individual Time: 3710-6269 and 0930-1030 OT Individual Time Calculation (min): 71 min and 30 min    Short Term Goals: Week 1:  OT Short Term Goal 1 (Week 1): Pt will perform sit <> stand with max +1 OT Short Term Goal 2 (Week 1): Pt will thread LB clothing over BLE with (S) OT Short Term Goal 3 (Week 1): Pt will perform 1 grooming task in standing OT Short Term Goal 4 (Week 1): Pt will perform stand pivot transfer to w/c with max A  Skilled Therapeutic Interventions/Progress Updates:    Session 1: Upon entering the room, pt supine in bed awaiting OT arrival. Pt with no c/o pain this session. Pt performed bed mobility with supervision. Pt ambulating to dresser to obtain clean clothing with overall supervision for safety with RW. Pt ambulating with RW into bathroom with supervision for toileting needs and to don LB clothing. Pt returned to standing at sink for grooming tasks and seated for UB hygiene and clothing management with supervision. Pt ambulating 200' with quad cane and CGA to ADL apartment. OT demonstrating transfer onto TTB in shower chair with use of quad cane. Pt returning demonstration with steady assistance for balance but able to place B LEs into tub. OT discussed safety concerns within home and set up. OT recommended safety treads to increase safety at home. Pt with increase fatigue and requesting ambulating with RW back to room. Pt returning to room with RW and overall supervision. Pt seated in recliner chair with K pad places and RN called for medication. Call bell and all needs within reach.   Session 2: Pt seated in wheelchair with 5/10 c/o pain this session in lower back. Pt recently received pain medication and OT monitored throughout session. Pt ambulating with RW and close supervision 100' to gym. Pt standing for 2 minutes , secondary  to back pain, during dynavision task. Pt with no LOB when reaching in various directions and average reaction time of 1.2 seconds. Pt taking seated rest break secondary to fatigue. Pt standing on air ex foam with initial CGA progressing to close supervision and able to decrease reaction time with increased balance challenge. OT educated and demonstrated B UE strengthening exercises. Pt demonstrated use of green, level 3 resistive band with min verbal cues for technique. Pt performing 2 sets of 10 chest pulls, shoulder diagonal, shoulder flexion, bicep curls, and alternating punches. Pt ambulating back to room in same manner and K pad placed on lower back for pain management. Call bell and all needed items within reach.   Therapy Documentation Precautions:  Precautions Precautions: Fall Restrictions Weight Bearing Restrictions: No General:   Vital Signs: Therapy Vitals Temp: 97.9 F (36.6 C) Temp Source: Oral Pulse Rate: 85 Resp: 18 BP: (!) 128/101 Patient Position (if appropriate): Lying Oxygen Therapy SpO2: 90 % O2 Device: Room Air Pain:   ADL: ADL ADL Comments: Please see functional navigator Vision   Perception    Praxis   Exercises:   Other Treatments:    See Function Navigator for Current Functional Status.   Therapy/Group: Individual Therapy  Gypsy Decant 01/12/2018, 8:16 AM

## 2018-01-12 NOTE — Progress Notes (Signed)
Physical Therapy Session Note  Patient Details  Name: Jay Chen MRN: [id number] Date of Birth: May 22, 1966  Today's Date: 01/12/2018 PT Individual Time: 1415-1515 PT Individual Time Calculation (min): 60 min   Short Term Goals: Week 1:  PT Short Term Goal 1 (Week 1): Pt will perform all bed mobility with CGA  PT Short Term Goal 2 (Week 1): Pt will perform STS with mod A and LRAD  PT Short Term Goal 3 (Week 1): Pt will perform transfers with mod A and LRAD  PT Short Term Goal 4 (Week 1): Pt will ambulate 15 feet with LRAD and assist   Skilled Therapeutic Interventions/Progress Updates:    Pt reports 5/10 LBP prior to session and pain monitored throughout session. Pt performs all stand pivot transfers and STS with supervision. Pt ambulates 100 feet from room towards gym using quad cane with proper sequencing and supervision. Pt requires seated rest break 2/2 back pain increasing with activity. PT propels Pt rest of way to gym for time management. Pt performs x1 ambulation trial for 50 feet with rollator with supervision with therapist providing safety education for application of brakes prior to transfers and prior to sitting on rollator to position device against secure wall with brakes applied for safety. Pt verbalizes and demonstrates understanding. Pt performs obstacle course with rollator and supervision consisting of cone weaving, uneven surface and retrograde ambulation for inc challenge. Pt then performs seated there ex with 3# ankle weights for 3x12 reps of hip flexion & knee extension, hip abduction with blue t-band, and bridging for 2x10 reps in hooklying with rest break between sets.Pt requires min A for LE management for sitting edge of mat<>hooklying. Pt then ambulates from gym> room for 200 feet with rollator and supervision and performs ambulatory transfer to toilet for voiding with therapist cueing patient to lock rollator prior to eccentric lower to toilet. Pt then performs  ambulatory transfer from toilet> sink for hand washing with supervision. Pt left in recliner with tray table and call bell in reach and all needs met.   Therapy Documentation Precautions:  Precautions Precautions: Fall Restrictions Weight Bearing Restrictions: No   See Function Navigator for Current Functional Status.   Therapy/Group: Individual Therapy  Floreen Comber 01/12/2018, 4:16 PM

## 2018-01-12 NOTE — Progress Notes (Signed)
[facility]IC[facility] RE[facility]ILITATION     PROGRESS NOTE    Subjective/Complaints: Up in chair. Low back and left leg pain but tolerable. Heat helps. Had questions about neck/back  ROS: Patient denies fever, rash, sore throat, blurred vision, nausea, vomiting, diarrhea, cough, shortness of breath or chest pain, joint or back pain, headache, or mood change.    Objective:  No results found. No results for input(s): WBC, HGB, HCT, PLT in the last 72 hours. No results for input(s): NA, K, CL, GLUCOSE, BUN, CREATININE, CALCIUM in the last 72 hours.  Invalid input(s): CO CBG (last 3)  Recent Labs    01/11/18 2115 01/12/18 0658 01/12/18 1147  GLUCAP 110* 135* 97    Wt Readings from Last 3 Encounters:  01/12/18 (!) 160.4 kg  01/04/18 (!) 155.7 kg  11/22/17 (!) 164.7 kg     Intake/Output Summary (Last 24 hours) at 01/12/2018 1451 Last data filed at 01/12/2018 1300 Gross per 24 hour  Intake 720 ml  Output 375 ml  Net 345 ml    Vital Signs: Blood pressure (!) 128/101, pulse 85, temperature 97.9 F (36.6 C), temperature source Oral, resp. rate 18, weight (!) 160.4 kg, SpO2 90 %. Physical Exam:  Constitutional: No distress . Vital signs reviewed. obese HEENT: EOMI, oral membranes moist Neck: supple Cardiovascular: RRR without murmur. No JVD . Some edema  Respiratory: CTA Bilaterally without wheezes or rales. Normal effort    GI: BS +, non-tender, non-distended  Neurological: He isalertand oriented to person, place, and time. Follows full commands.  Skin: Skin iswarmand dry. Motor strength is 4-/5 bilateral hip flexor, 4/5 knee extensor ankle dorsiflexor 5/5 bilateral deltoid bicep tricep grip Sensation intact to light touch bilateral upper and lower limb--motor and sensory stable Musc: LB and LLE tenderness Psych: pleasant   Assessment/Plan: 1. Functional deficits secondary to cervical cord compression, lumbar stenosis/radic, left temporal lobe infarct  which require 3+ hours per day of interdisciplinary therapy in a comprehensive inpatient rehab setting. Physiatrist is providing close team supervision and 24 hour management of active medical problems listed below. Physiatrist and rehab team continue to assess barriers to discharge/monitor patient progress toward functional and medical goals.  Function:  Bathing Bathing position Bathing activity did not occur: Refused Position: Production manager parts bathed by patient: Right arm, Left arm, Chest, Front perineal area, Abdomen, Right upper leg, Left upper leg, Right lower leg, Left lower leg, Buttocks, Back Body parts bathed by helper: Buttocks, Back  Bathing assist Assist Level: Assistive device, Touching or steadying assistance(Pt > 75%) Assistive Device Comment: LH sponge    Upper Body Dressing/Undressing Upper body dressing   What is the patient wearing?: Pull over shirt/dress     Pull over shirt/dress - Perfomed by patient: Thread/unthread right sleeve, Put head through opening, Thread/unthread left sleeve, Pull shirt over trunk          Upper body assist Assist Level: Supervision or verbal cues   Set up : To obtain clothing/put away  Lower Body Dressing/Undressing Lower body dressing   What is the patient wearing?: Pants, Shoes, [employer], Underwear Underwear - Performed by patient: Thread/unthread left underwear leg, Thread/unthread right underwear leg, Pull underwear up/down Underwear - Performed by helper: Thread/unthread right underwear leg, Pull underwear up/down Pants- Performed by patient: Thread/unthread left pants leg, Thread/unthread right pants leg, Pull pants up/down Pants- Performed by helper: Thread/unthread right pants leg   Non-skid slipper socks- Performed by helper: Don/doff right sock, Don/doff left  sock Socks - Performed by patient: Don/doff right sock, Don/doff left sock   Shoes - Performed by patient: Don/doff right shoe, Fasten right, Fasten  left, Don/doff left shoe Shoes - Performed by helper: Don/doff right shoe, Don/doff left shoe, Fasten right, Fasten left       TED Hose - Performed by helper: Don/doff right TED hose, Don/doff left TED hose  Lower body assist Assist for lower body dressing: Set up, Touching or steadying assistance (Pt > 75%)   Set up : Don/doff TED stockings  Toileting Toileting Toileting activity did not occur: No continent bowel/bladder event Toileting steps completed by patient: Adjust clothing prior to toileting, Performs perineal hygiene, Adjust clothing after toileting Toileting steps completed by helper: Adjust clothing prior to toileting, Performs perineal hygiene, Adjust clothing after toileting Toileting Assistive Devices: Grab bar or rail  Toileting assist Assist level: Supervision or verbal cues   Transfers Chair/bed transfer Chair/bed transfer activity did not occur: Refused Chair/bed transfer method: Stand pivot, Ambulatory Chair/bed transfer assist level: Supervision or verbal cues Chair/bed transfer assistive device: Walker, Marine scientist lift: Stedy(per PT documentation)   Locomotion Ambulation Ambulation activity did not occur: Safety/medical concerns   Max distance: 150 Assist level: Supervision or verbal cues   Wheelchair   Type: Manual Max wheelchair distance: >500 Assist Level: Supervision or verbal cues  Cognition Comprehension Comprehension assist level: Follows basic conversation/direction with no assist  Expression Expression assist level: Expresses basic needs/ideas: With extra time/assistive device  Social Interaction Social Interaction assist level: Interacts appropriately 90% of the time - Needs monitoring or encouragement for participation or interaction.  Problem Solving Problem solving assist level: Solves basic 90% of the time/requires cueing < 10% of the time  Memory Memory assist level: Recognizes or recalls 90% of the time/requires cueing < 10% of the  time   Medical Problem List and Plan: 1.Decreased functional mobilitysecondary to cervical stenosis with cord compression, lumbar spondylosis with radiculopathy, left temporal lobe infarction.Patient is to follow-up outpatient with Dr. Kathyrn Sheriff to discuss possible surgical interventions for lumbar radiculopathy  -CIR PT OT, continues to make functional gains  -will need outpt follow up for neck/back. We discussed at length today 2. DVT Prophylaxis/Anticoagulation: Xarelto.   3. Pain Management:Robaxin 500 mg 3 times daily, oxycodone as needed  -continue kpad for back 4. Mood:Provide emotional support 5. Neuropsych: This patientiscapable of making decisions on hisown behalf. 6. Skin/Wound Care:Routine skin checks 7. Fluids/Electrolytes/Nutrition:I personally reviewed the patient's labs today.   8.Persistent atrial fibrillation. Cardiac rate controlled. Amiodarone 200 mg twice daily,Toprol 100mg  daily Vitals:   01/11/18 2039 01/12/18 0420  BP: (!) 122/93 (!) 128/101  Pulse: 93 85  Resp: 18 18  Temp: 97.6 F (36.4 C) 97.9 F (36.6 C)  SpO2: 98% 90%  Rate is controlled 8/22  -bp elevated. Follow for trend 9.Chronic systolic and diastolic congestive heart failure. Monitor for any signs of fluid overload. Lasix 40 mg Daily  - Filed Weights   01/10/18 0430 01/11/18 0409 01/12/18 0420  Weight: (!) 160.9 kg (!) 159.3 kg (!) 160.4 kg    -weights stable 10.LLLpneumonia. Follow-up chest x-ray 01/03/2018 with no acute process. Complete course of Omnicef and doxycycline 11. Diabetes mellitus with peripheral neuropathy. Hemoglobin A1c 8.4. SSI. Check blood sugars before meals and at bedtime. Patient on Glucophage 1000 mg twice daily prior to admission currently on hold due to renal insufficiency  - CBG (last 3)  Recent Labs    01/11/18 2115 01/12/18 0658 01/12/18 1147  GLUCAP 110*  135* 97  Well-controlled 8/22 12.CKD stage III. Cr sl elevated 1.9,  recheck later this week 13.Hyperlipidemia. Lovaza/Lipitor 14.Morbid obesity. BMI 51.99. Dietary follow-up 15.Constipation. Laxative assistance 16. Gout left great toe: improved off steroids,    --push fluids, dietary considerations have been discussed   LOS (Days) 8 A FACE TO FACE EVALUATION WAS PERFORMED  Meredith Staggers, MD 01/12/2018 2:51 PM

## 2018-01-13 ENCOUNTER — Inpatient Hospital Stay (HOSPITAL_COMMUNITY): Payer: BC Managed Care – PPO | Admitting: Occupational Therapy

## 2018-01-13 ENCOUNTER — Inpatient Hospital Stay (HOSPITAL_COMMUNITY): Payer: BC Managed Care – PPO | Admitting: Physical Therapy

## 2018-01-13 LAB — GLUCOSE, CAPILLARY
GLUCOSE-CAPILLARY: 131 mg/dL — AB (ref 70–99)
GLUCOSE-CAPILLARY: 112 mg/dL — AB (ref 70–99)
GLUCOSE-CAPILLARY: 110 mg/dL — AB (ref 70–99)
GLUCOSE-CAPILLARY: 128 mg/dL — AB (ref 70–99)

## 2018-01-13 NOTE — Progress Notes (Signed)
Physical Therapy Session Note  Patient Details  Name: Jay Chen MRN: [id number] Date of Birth: 09-08-1965  Today's Date: 01/13/2018 PT Individual Time: 1300-1400 PT Individual Time Calculation (min): 60 min   Short Term Goals: Week 1:  PT Short Term Goal 1 (Week 1): Pt will perform all bed mobility with CGA  PT Short Term Goal 2 (Week 1): Pt will perform STS with mod A and LRAD  PT Short Term Goal 3 (Week 1): Pt will perform transfers with mod A and LRAD  PT Short Term Goal 4 (Week 1): Pt will ambulate 15 feet with LRAD and assist   Skilled Therapeutic Interventions/Progress Updates:    Pt reports 5/10 LBP prior to session start and pain monitored throughout session. Pt performs all STS, mat mobility and stand pivot transfers with supervision using rollator with increased time. Pt performs ambulatory transfer from w/c> toilet for voiding using rollator with supervision. Pt washes hands at sink with supervision and rollator for UE assist for balance. Pt ambulates 200 ft from room>gym with rollator and supervision requiring 1 seated rest break demonstrating good safety when positioning rollator against wall and locking prior to sitting. Pt performs LE stretching to assist with alleviating LBP for 3x1 min holds for hamstrings sitting edge of mat, and hip IR supine on mat using figure 4 technique. Therapist educates patient regarding importance of core stabilization and transverse abdominus (TA) strengthening to inc lower back support. Pt performs 1x10 sec holds with TA contraction in supine hooklying. PT progresses Pt to alternating leg lifts with 10 sec holds contracting TA for 1x 10 reps. Pt then performs supine> sitting edge of mat with supervision and ambulates back to room for 200 feet with rollator and supervision performing ambulatory transfer to chair. Pt left sitting in chair with tray table and call bell in reach and all needs met.  Therapy Documentation Precautions:   Precautions Precautions: Fall Restrictions Weight Bearing Restrictions: No  See Function Navigator for Current Functional Status.   Therapy/Group: Individual Therapy  Floreen Comber 01/13/2018, 4:04 PM

## 2018-01-13 NOTE — Progress Notes (Signed)
[facility]IC[facility] RE[facility]ILITATION     PROGRESS NOTE    Subjective/Complaints: No new complaints. Up in bed. Pain seems under control.   ROS: Patient denies fever, rash, sore throat, blurred vision, nausea, vomiting, diarrhea, cough, shortness of breath or chest pain, joint or back pain, headache, or mood change.     Objective:  No results found. No results for input(s): WBC, HGB, HCT, PLT in the last 72 hours. No results for input(s): NA, K, CL, GLUCOSE, BUN, CREATININE, CALCIUM in the last 72 hours.  Invalid input(s): CO CBG (last 3)  Recent Labs    01/12/18 1700 01/12/18 2115 01/13/18 0648  GLUCAP 110* 133* 131*    Wt Readings from Last 3 Encounters:  01/13/18 (!) 160.4 kg  01/04/18 (!) 155.7 kg  11/22/17 (!) 164.7 kg     Intake/Output Summary (Last 24 hours) at 01/13/2018 0901 Last data filed at 01/13/2018 0609 Gross per 24 hour  Intake 480 ml  Output 775 ml  Net -295 ml    Vital Signs: Blood pressure 125/89, pulse 72, temperature 98 F (36.7 C), temperature source Oral, resp. rate 20, weight (!) 160.4 kg, SpO2 91 %. Physical Exam:  Constitutional: No distress . Vital signs reviewed. HEENT: EOMI, oral membranes moist Neck: supple Cardiovascular: IRR IRR without murmur. No JVD    Respiratory: CTA Bilaterally without wheezes or rales. Normal effort    GI: BS +, non-tender, non-distended  Neurological: He isalertand oriented to person, place, and time. Follows full commands.  Skin: Skin iswarmand dry. Motor strength is 4- to 4/5 bilateral hip flexor, 4/5 knee extensor ankle dorsiflexor 5/5 bilateral deltoid bicep tricep grip Sensation intact Musc: LB and LLE tenderness with AROM. 1+ LE edema Psych: pleasant   Assessment/Plan: 1. Functional deficits secondary to cervical cord compression, lumbar stenosis/radic, left temporal lobe infarct which require 3+ hours per day of interdisciplinary therapy in a comprehensive inpatient rehab  setting. Physiatrist is providing close team supervision and 24 hour management of active medical problems listed below. Physiatrist and rehab team continue to assess barriers to discharge/monitor patient progress toward functional and medical goals.  Function:  Bathing Bathing position Bathing activity did not occur: Refused Position: Production manager parts bathed by patient: Right arm, Left arm, Chest, Front perineal area, Abdomen, Right upper leg, Left upper leg, Right lower leg, Left lower leg, Buttocks, Back Body parts bathed by helper: Buttocks, Back  Bathing assist Assist Level: Assistive device, Touching or steadying assistance(Pt > 75%) Assistive Device Comment: LH sponge    Upper Body Dressing/Undressing Upper body dressing   What is the patient wearing?: Pull over shirt/dress     Pull over shirt/dress - Perfomed by patient: Thread/unthread right sleeve, Put head through opening, Thread/unthread left sleeve, Pull shirt over trunk          Upper body assist Assist Level: Supervision or verbal cues   Set up : To obtain clothing/put away  Lower Body Dressing/Undressing Lower body dressing   What is the patient wearing?: Pants, Shoes, [employer], Underwear Underwear - Performed by patient: Thread/unthread left underwear leg, Thread/unthread right underwear leg, Pull underwear up/down Underwear - Performed by helper: Thread/unthread right underwear leg, Pull underwear up/down Pants- Performed by patient: Thread/unthread left pants leg, Thread/unthread right pants leg, Pull pants up/down Pants- Performed by helper: Thread/unthread right pants leg   Non-skid slipper socks- Performed by helper: Don/doff right sock, Don/doff left sock Socks - Performed by patient: Don/doff right sock, Don/doff left  sock   Shoes - Performed by patient: Don/doff right shoe, Fasten right, Fasten left, Don/doff left shoe Shoes - Performed by helper: Don/doff right shoe, Don/doff left shoe,  Fasten right, Fasten left       TED Hose - Performed by helper: Don/doff right TED hose, Don/doff left TED hose  Lower body assist Assist for lower body dressing: Set up, Touching or steadying assistance (Pt > 75%)   Set up : Don/doff TED stockings  Toileting Toileting Toileting activity did not occur: No continent bowel/bladder event Toileting steps completed by patient: Adjust clothing prior to toileting, Performs perineal hygiene, Adjust clothing after toileting Toileting steps completed by helper: Adjust clothing prior to toileting, Performs perineal hygiene, Adjust clothing after toileting Toileting Assistive Devices: Grab bar or rail  Toileting assist Assist level: Supervision or verbal cues   Transfers Chair/bed transfer Chair/bed transfer activity did not occur: Refused Chair/bed transfer method: Stand pivot, Ambulatory Chair/bed transfer assist level: Supervision or verbal cues Chair/bed transfer assistive device: Other(rollator) Mechanical lift: Stedy(per PT documentation)   Locomotion Ambulation Ambulation activity did not occur: Safety/medical concerns   Max distance: 200 Assist level: Supervision or verbal cues   Wheelchair   Type: Manual Max wheelchair distance: >500 Assist Level: Supervision or verbal cues  Cognition Comprehension Comprehension assist level: Follows basic conversation/direction with no assist  Expression Expression assist level: Expresses basic needs/ideas: With extra time/assistive device  Social Interaction Social Interaction assist level: Interacts appropriately 90% of the time - Needs monitoring or encouragement for participation or interaction.  Problem Solving Problem solving assist level: Solves basic 90% of the time/requires cueing < 10% of the time  Memory Memory assist level: Recognizes or recalls 90% of the time/requires cueing < 10% of the time   Medical Problem List and Plan: 1.Decreased functional mobilitysecondary to cervical  stenosis with cord compression, lumbar spondylosis with radiculopathy, left temporal lobe infarction.Patient is to follow-up outpatient with Dr. Kathyrn Sheriff to discuss possible surgical interventions for lumbar radiculopathy  -CIR PT OT, continues to make functional gains  -will need outpt follow up for neck/back. 2. DVT Prophylaxis/Anticoagulation: Xarelto.   3. Pain Management:Robaxin 500 mg 3 times daily, oxycodone as needed  -continue kpad for back 4. Mood:Provide emotional support 5. Neuropsych: This patientiscapable of making decisions on hisown behalf. 6. Skin/Wound Care:Routine skin checks 7. Fluids/Electrolytes/Nutrition:I personally reviewed the patient's labs today.   8.Persistent atrial fibrillation. Cardiac rate controlled. Amiodarone 200 mg twice daily,Toprol 100mg  daily Vitals:   01/12/18 2012 01/13/18 0607  BP: 109/76 125/89  Pulse: 84 72  Resp: 16 20  Temp: 98.5 F (36.9 C) 98 F (36.7 C)  SpO2: 97% 91%  Rate is controlled 8/23 9.Chronic systolic and diastolic congestive heart failure. Monitor for any signs of fluid overload. Lasix 40 mg Daily  - Filed Weights   01/11/18 0409 01/12/18 0420 01/13/18 0607  Weight: (!) 159.3 kg (!) 160.4 kg (!) 160.4 kg    -weights stable around 160kg 10.LLLpneumonia. Follow-up chest x-ray 01/03/2018 with no acute process. Completed course of Omnicef and doxycycline 11. Diabetes mellitus with peripheral neuropathy. Hemoglobin A1c 8.4. SSI. Check blood sugars before meals and at bedtime. Patient on Glucophage 1000 mg twice daily prior to admission currently on hold due to renal insufficiency  - CBG (last 3)  Recent Labs    01/12/18 1700 01/12/18 2115 01/13/18 0648  GLUCAP 110* 133* 131*  Well-controlled 8/23 12.CKD stage III. Cr sl elevated 1.9, recheck later this week 13.Hyperlipidemia. Lovaza/Lipitor 14.Morbid obesity. BMI 51.99. Dietary  follow-up 15.Constipation. Laxative  assistance 16. Gout left great toe: improved off steroids,    --push fluids, dietary considerations have been discussed   LOS (Days) 9 A FACE TO FACE EVALUATION WAS PERFORMED  Meredith Staggers, MD 01/13/2018 9:01 AM

## 2018-01-13 NOTE — Progress Notes (Signed)
Occupational Therapy Session Note  Patient Details  Name: Jay Chen MRN: [id number] Date of Birth: Jan 09, 1966  Today's Date: 01/13/2018 OT Individual Time: 1034-1200 OT Individual Time Calculation (min): 86 min   Short Term Goals: Week 1:  OT Short Term Goal 1 (Week 1): Pt will perform sit <> stand with max +1 OT Short Term Goal 2 (Week 1): Pt will thread LB clothing over BLE with (S) OT Short Term Goal 3 (Week 1): Pt will perform 1 grooming task in standing OT Short Term Goal 4 (Week 1): Pt will perform stand pivot transfer to w/c with max Chen  Skilled Therapeutic Interventions/Progress Updates:    Pt greeted in w/c with ADL needs met. Agreeable to tx. He self propelled to therapy apartment for B UE strengthening. Tx focus on balance, functional ambulation, endurance, and DME safety during meaningful cooking activity. Pt opted to bake sugar cookies during session. He ambulated with rollator to gather necessary items with vcs for safe DME positioning. While seated on rollator seat, pt mixed ingredients and formed cookies for pan. While cookies were baking, he engaged in cleanup tasks, such as washing dishes, throwing away trash, and returning baking items to fridge/cabinets. He dropped butter package onto floor. When asked what he would do if this happened at home, pt reported: "ask for help." After standing and transferring cookies onto plate, he ambulated to RN station with supervision to to give plate staff. Before leaving apartment, pt completed toilet transfer/toileting at ambulatory level as well. Pt then headed back to room using device 1/4 way, and then opted to self propel remainder of distance to conserve energy. Pt was left in room with all needs within reach and lunch.   Therapy Documentation Precautions:  Precautions Precautions: Fall Restrictions Weight Bearing Restrictions: No Pain: C/o 5/10 pain in back. Pt medicated during session and k pad applied post session Pain  Assessment Pain Scale: 0-10 Pain Score: 6  Pain Location: Back Pain Orientation: Lower Pain Descriptors / Indicators: Aching Patients Stated Pain Goal: 3 Pain Intervention(s): Medication (See eMAR);Repositioned ADL: ADL ADL Comments: Please see functional navigator     See Function Navigator for Current Functional Status.   Therapy/Group: Individual Therapy  Jay Chen Aviyon Hocevar 01/13/2018, 12:32 PM

## 2018-01-13 NOTE — Progress Notes (Signed)
Physical Therapy Session Note  Patient Details  Name: Jay Chen MRN: [id number] Date of Birth: 1965-10-20  Today's Date: 01/13/2018 PT Individual Time: 0900-1000 PT Individual Time Calculation (min): 60 min   Short Term Goals: Week 1:  PT Short Term Goal 1 (Week 1): Pt will perform all bed mobility with CGA  PT Short Term Goal 2 (Week 1): Pt will perform STS with mod A and LRAD  PT Short Term Goal 3 (Week 1): Pt will perform transfers with mod A and LRAD  PT Short Term Goal 4 (Week 1): Pt will ambulate 15 feet with LRAD and assist   Skilled Therapeutic Interventions/Progress Updates:     Pt received seated in w/c in room, agreeable to PT. Pt reports some soreness in low back and L knee, not rated and declines any intervention. Pt transfers with Supervision to 0KX and/or QC throughout therapy session. Ambulation 2 x 200 ft with 3GH and Supervision, focus on navigating through cones and other tight spaces to simulate functional mobility in the home setting. Pt demos good safety with [id number] brake management with transfers. Ambulation x 100 ft with quad cane and CGA, focus on navigating through cones and other tight spaces to simulate functional mobility in the home setting. Standing alt L/R cone taps with BUE support and CGA for BLE coordination training. Sidesteps 2 x 30 ft L/R with BUE support and close SBA. Pt left seated in w/c in room with needs in reach at end of therapy session.  Therapy Documentation Precautions:  Precautions Precautions: Fall Restrictions Weight Bearing Restrictions: No  See Function Navigator for Current Functional Status.   Therapy/Group: Individual Therapy  Excell Seltzer, PT, DPT  01/13/2018, 10:30 AM

## 2018-01-14 ENCOUNTER — Encounter (HOSPITAL_COMMUNITY): Payer: BC Managed Care – PPO

## 2018-01-14 ENCOUNTER — Ambulatory Visit (HOSPITAL_COMMUNITY): Payer: BC Managed Care – PPO

## 2018-01-14 DIAGNOSIS — I5042 Chronic combined systolic (congestive) and diastolic (congestive) heart failure: Secondary | ICD-10-CM

## 2018-01-14 DIAGNOSIS — I6389 Other cerebral infarction: Secondary | ICD-10-CM

## 2018-01-14 DIAGNOSIS — E1140 Type 2 diabetes mellitus with diabetic neuropathy, unspecified: Secondary | ICD-10-CM

## 2018-01-14 DIAGNOSIS — E1142 Type 2 diabetes mellitus with diabetic polyneuropathy: Secondary | ICD-10-CM

## 2018-01-14 DIAGNOSIS — N183 Chronic kidney disease, stage 3 unspecified: Secondary | ICD-10-CM

## 2018-01-14 DIAGNOSIS — E1165 Type 2 diabetes mellitus with hyperglycemia: Secondary | ICD-10-CM

## 2018-01-14 LAB — GLUCOSE, CAPILLARY
Glucose-Capillary: 112 mg/dL — ABNORMAL HIGH (ref 70–99)
Glucose-Capillary: 130 mg/dL — ABNORMAL HIGH (ref 70–99)
Glucose-Capillary: 103 mg/dL — ABNORMAL HIGH (ref 70–99)
Glucose-Capillary: 144 mg/dL — ABNORMAL HIGH (ref 70–99)

## 2018-01-14 NOTE — Progress Notes (Signed)
Social Work Patient ID: Jay Chen, male   DOB: 12/04/1965, 52 y.o.   MRN: [id number]   CSW met with pt on 01-11-18 to update him on team conference discussion.  Pt is pleased with his progress and feels he will be ready for d/c on 8-2s7-19.  He was agreeable to CSW reaching out to his wife to update her on the above and to set up family education.  CSW attempted to talk with wife 01-12-18 without success and spoke with her via telephone on 01-13-18.  She plans to come for family education 01-14-18 from 9am-11am.  CSW explained that DME would be ordered and [place] arranged for his d/c.  She will plan to get him at 11am, as this is her second day at school, and she needs to return to school for the end of the day.  His mother plans to be with him while wife is at school.  CSW will alert staff to this and will remain available to assist as needed.

## 2018-01-14 NOTE — Progress Notes (Signed)
Occupational Therapy Session Note  Patient Details  Name: Jay Chen MRN: [id number] Date of Birth: 11-22-1965  Today's Date: 01/14/2018 OT Individual Time: 1000-1054 OT Individual Time Calculation (min): 54 min    Short Term Goals: Week 1:  OT Short Term Goal 1 (Week 1): Pt will perform sit <> stand with max +1 OT Short Term Goal 2 (Week 1): Pt will thread LB clothing over BLE with (S) OT Short Term Goal 3 (Week 1): Pt will perform 1 grooming task in standing OT Short Term Goal 4 (Week 1): Pt will perform stand pivot transfer to w/c with max A  Skilled Therapeutic Interventions/Progress Updates:    1:1. Pt wife, and daughter present for family education. Wife and daughter educated on TTB and BSC over toilet transfers, safety with rollator adaptations to bathrrom environment (non slip tread on tub, hand held shower head, etc), BSC/urinal use use for night time, energy conservation techniques and wearing non slip footwear throughout house. Pt wife able to demo supervision of continent void on [facility] over toilet with appropriate cueing as well as TTB transfer using rollator. Wife questions whether rolator will fit in bathroom. OT recommending using rolator with supervision at this time in household while pt still improves with quad cane. Wife verbalizes understanding she would need to provide more support/hands on A if pt required to use quad cane to access bathroom. Exited session with pt seated in w/c, call light in reach and wife present to supervise.   Therapy Documentation Precautions:  Precautions Precautions: Fall Restrictions Weight Bearing Restrictions: No General:   Vital Signs:  Pain: Pain Assessment Pain Scale: 0-10 Pain Score: 4  Pain Type: Acute pain Pain Location: Back Pain Orientation: Lower Pain Descriptors / Indicators: Dull Pain Intervention(s): Medication (See eMAR) ADL: See Function Navigator for Current Functional Status.   Therapy/Group: Individual  Therapy  Tonny Branch 01/14/2018, 10:54 AM

## 2018-01-14 NOTE — Progress Notes (Signed)
Physical Therapy Session Note  Patient Details  Name: Jay Chen MRN: [id number] Date of Birth: 1965-09-26  Today's Date: 01/14/2018 PT Individual Time:0900  - 1000, 60 min individual tx     Short Term Goals: Week 1:  PT Short Term Goal 1 (Week 1): Pt will perform all bed mobility with CGA  PT Short Term Goal 2 (Week 1): Pt will perform STS with mod A and LRAD  PT Short Term Goal 3 (Week 1): Pt will perform transfers with mod A and LRAD  PT Short Term Goal 4 (Week 1): Pt will ambulate 15 feet with LRAD and assist  Week 2:     Skilled Therapeutic Interventions/Progress Updates:   PT donned TEDS; pt donned shoes in sitting with supervision.  Gait training with 4WW on level tile throughout unit, with supervision, no cues required for safety.    Pt's wife and dtr arrived. Wife participated in family ed for simulated car transfer, steps, gait on level tile with 4WW and [facility], folding 4WW, discussed removing throw rugs in the home. Pt continues to needs mod/max cues for safe sequencing when using quad cane.  Also, bed mobility in ADL apt with supervision; pt stated he usually sleeps on his abdomen, on 3-4 pillows, or on his side with 3-4 pillows..  PT educated pt and wife about difficulties with spinal alignment in this position, and recommended they work with [employer] or [employer] to find a compromise.  He has a CPAP but did not like it; PT recommended he re-try it after requesting the vendor adjust it.    Pt left resting in w/c with wife and dtr in attendance, all needs at hand.     Therapy Documentation Precautions:  Precautions Precautions: Fall Restrictions Weight Bearing Restrictions: No  Pain: 4/10 low back; pt plans to ask for pain meds      See Function Navigator for Current Functional Status.   Therapy/Group: Individual Therapy  Carly Sabo 01/14/2018, 8:21 AM

## 2018-01-14 NOTE — Progress Notes (Signed)
[facility] PHYSICAL MEDICINE & REHABILITATION     PROGRESS NOTE    Subjective/Complaints: Patient seen lying in bed this morning. He states he slept well overnight. He denies complaints.  ROS: Denies CP, SOB, N/V/D  Objective:  No results found. No results for input(s): WBC, HGB, HCT, PLT in the last 72 hours. No results for input(s): NA, K, CL, GLUCOSE, BUN, CREATININE, CALCIUM in the last 72 hours.  Invalid input(s): CO CBG (last 3)  Recent Labs    01/13/18 1714 01/13/18 2106 01/14/18 0644  GLUCAP 110* 128* 112*    Wt Readings from Last 3 Encounters:  01/14/18 (!) 162.7 kg  01/04/18 (!) 155.7 kg  11/22/17 (!) 164.7 kg     Intake/Output Summary (Last 24 hours) at 01/14/2018 0809 Last data filed at 01/13/2018 1843 Gross per 24 hour  Intake 740 ml  Output 200 ml  Net 540 ml    Vital Signs: Blood pressure (!) 124/92, pulse 70, temperature 98.3 F (36.8 C), temperature source Oral, resp. rate 18, weight (!) 162.7 kg, SpO2 97 %. Physical Exam:  Constitutional: No distress . Vital signs reviewed. HENT: Normocephalic.  Atraumatic. Eyes: EOMI. No discharge. Cardiovascular: irregularly irregular. No JVD. Respiratory: CTA Bilaterally. Normal effort. GI: BS +. Non-distended. Musc: lower extremity edema. No tenderness in extremities. Neurological: He isalertand oriented Follows full commands.  Motor: 4+/5 throughout Skin: Skin iswarmand dry.intact. Psych: pleasant and appropriate.  Assessment/Plan: 1. Functional deficits secondary to cervical cord compression, lumbar stenosis/radic, left temporal lobe infarct which require 3+ hours per day of interdisciplinary therapy in a comprehensive inpatient rehab setting. Physiatrist is providing close team supervision and 24 hour management of active medical problems listed below. Physiatrist and rehab team continue to assess barriers to discharge/monitor patient progress toward functional and medical  goals.  Function:  Bathing Bathing position Bathing activity did not occur: Refused Position: Production manager parts bathed by patient: Right arm, Left arm, Chest, Front perineal area, Abdomen, Right upper leg, Left upper leg, Right lower leg, Left lower leg, Buttocks, Back Body parts bathed by helper: Buttocks, Back  Bathing assist Assist Level: Assistive device, Touching or steadying assistance(Pt > 75%) Assistive Device Comment: LH sponge    Upper Body Dressing/Undressing Upper body dressing   What is the patient wearing?: Pull over shirt/dress     Pull over shirt/dress - Perfomed by patient: Thread/unthread right sleeve, Put head through opening, Thread/unthread left sleeve, Pull shirt over trunk          Upper body assist Assist Level: Supervision or verbal cues   Set up : To obtain clothing/put away  Lower Body Dressing/Undressing Lower body dressing   What is the patient wearing?: Pants, Shoes, [employer], Underwear Underwear - Performed by patient: Thread/unthread left underwear leg, Thread/unthread right underwear leg, Pull underwear up/down Underwear - Performed by helper: Thread/unthread right underwear leg, Pull underwear up/down Pants- Performed by patient: Thread/unthread left pants leg, Thread/unthread right pants leg, Pull pants up/down Pants- Performed by helper: Thread/unthread right pants leg   Non-skid slipper socks- Performed by helper: Don/doff right sock, Don/doff left sock Socks - Performed by patient: Don/doff right sock, Don/doff left sock   Shoes - Performed by patient: Don/doff right shoe, Fasten right, Fasten left, Don/doff left shoe Shoes - Performed by helper: Don/doff right shoe, Don/doff left shoe, Fasten right, Fasten left       TED Hose - Performed by helper: Don/doff right TED hose, Don/doff left TED hose  Lower body assist  Assist for lower body dressing: Set up, Touching or steadying assistance (Pt > 75%)   Set up : Don/doff TED  stockings  Toileting Toileting Toileting activity did not occur: No continent bowel/bladder event Toileting steps completed by patient: Adjust clothing prior to toileting, Performs perineal hygiene, Adjust clothing after toileting Toileting steps completed by helper: Adjust clothing prior to toileting, Performs perineal hygiene, Adjust clothing after toileting Toileting Assistive Devices: Grab bar or rail  Toileting assist Assist level: Supervision or verbal cues   Transfers Chair/bed transfer Chair/bed transfer activity did not occur: Refused Chair/bed transfer method: Stand pivot, Ambulatory Chair/bed transfer assist level: Supervision or verbal cues Chair/bed transfer assistive device: Other(rollator) Mechanical lift: Stedy(per PT documentation)   Locomotion Ambulation Ambulation activity did not occur: Safety/medical concerns   Max distance: 200' Assist level: Supervision or verbal cues   Wheelchair   Type: Manual Max wheelchair distance: >500 Assist Level: Supervision or verbal cues  Cognition Comprehension Comprehension assist level: Follows basic conversation/direction with no assist  Expression Expression assist level: Expresses basic needs/ideas: With no assist  Social Interaction Social Interaction assist level: Interacts appropriately 90% of the time - Needs monitoring or encouragement for participation or interaction.  Problem Solving Problem solving assist level: Solves basic 90% of the time/requires cueing < 10% of the time  Memory Memory assist level: Recognizes or recalls 90% of the time/requires cueing < 10% of the time   Medical Problem List and Plan: 1.Decreased functional mobilitysecondary to cervical stenosis with cord compression, lumbar spondylosis with radiculopathy, left temporal lobe infarction.Patient is to follow-up outpatient with Dr. Kathyrn Sheriff to discuss possible surgical interventions for lumbar radiculopathy  -continue CIR  -will need outpt  follow up for neck/back.  Notes reviewed-functional deficits due to cord compression, radiculopathy, left CVA, images reviewed- left temporal lobe infarct, labs reviewed 2. DVT Prophylaxis/Anticoagulation: Xarelto.   3. Pain Management:Robaxin 500 mg 3 times daily, oxycodone as needed  -continue kpad for back 4. Mood:Provide emotional support 5. Neuropsych: This patientiscapable of making decisions on hisown behalf. 6. Skin/Wound Care:Routine skin checks 7. Fluids/Electrolytes/Nutrition:monitor ins and outs  8. Atrial fibrillation. Cardiac rate controlled. Amiodarone 200 mg twice daily,Toprol 100mg  daily Vitals:   01/13/18 1930 01/14/18 0432  BP: 125/75 (!) 124/92  Pulse: 80 70  Resp: 16 18  Temp: 98.1 F (36.7 C) 98.3 F (36.8 C)  SpO2: 94% 97%   Rate is controlled 8/24 9.Chronic systolic and diastolic congestive heart failure. Monitor for any signs of fluid overload. Lasix 40 mg Daily  - Filed Weights   01/12/18 0420 01/13/18 0607 01/14/18 0432  Weight: (!) 160.4 kg (!) 160.4 kg (!) 162.7 kg    -relatively stable on 8/24 10.LLLpneumonia. Follow-up chest x-ray 01/03/2018 with no acute process. Completed course of Omnicef and doxycycline 11. Diabetes mellitus with peripheral neuropathy. Hemoglobin A1c 8.4. SSI. Check blood sugars before meals and at bedtime. Patient on Glucophage 1000 mg twice daily prior to admission currently on hold due to renal insufficiency  - CBG (last 3)  Recent Labs    01/13/18 1714 01/13/18 2106 01/14/18 0644  GLUCAP 110* 128* 112*   Controlled on 8/24 12.CKD stage III.   Creatinine 1.94 on 8/19  Labs ordered for Monday 13.Hyperlipidemia. Lovaza/Lipitor 14.Morbid obesity. BMI 51.99. Dietary follow-up 15.Constipation. Laxative assistance 16. Gout left great toe: improved, now off steroids,    Push fluids, dietary considerations have been discussed   LOS (Days) 10 A FACE TO FACE EVALUATION WAS  PERFORMED  Ankit Lorie Phenix, MD 01/14/2018  8:09 AM

## 2018-01-15 ENCOUNTER — Inpatient Hospital Stay (HOSPITAL_COMMUNITY): Payer: BC Managed Care – PPO

## 2018-01-15 DIAGNOSIS — E1169 Type 2 diabetes mellitus with other specified complication: Secondary | ICD-10-CM

## 2018-01-15 DIAGNOSIS — E669 Obesity, unspecified: Secondary | ICD-10-CM

## 2018-01-15 LAB — GLUCOSE, CAPILLARY
GLUCOSE-CAPILLARY: 109 mg/dL — AB (ref 70–99)
Glucose-Capillary: 142 mg/dL — ABNORMAL HIGH (ref 70–99)
Glucose-Capillary: 97 mg/dL (ref 70–99)
Glucose-Capillary: 110 mg/dL — ABNORMAL HIGH (ref 70–99)

## 2018-01-15 MED ORDER — POLYETHYLENE GLYCOL 3350 17 G PO PACK
17.0000 g | PACK | Freq: Every day | ORAL | Status: DC
  Filled 2018-01-15 (×2): qty 1

## 2018-01-15 NOTE — Progress Notes (Signed)
Occupational Therapy Session Note  Patient Details  Name: Jay Chen MRN: [id number] Date of Birth: 1965/10/12  Today's Date: 01/15/2018 OT Individual Time: 1005-1105 OT Individual Time Calculation (min): 60 min    Short Term Goals: Week 1:  OT Short Term Goal 1 (Week 1): Pt will perform sit <> stand with max +1 OT Short Term Goal 2 (Week 1): Pt will thread LB clothing over BLE with (S) OT Short Term Goal 3 (Week 1): Pt will perform 1 grooming task in standing OT Short Term Goal 4 (Week 1): Pt will perform stand pivot transfer to w/c with max A  Skilled Therapeutic Interventions/Progress Updates:    1:1. Pt with no c/o pain  But discomfort from constipation. Pt requesting to wash up at the sink this date. Pt completes sink level bathing with supervision at sit to stand level from recliner. Pt grooms seated in recliner with MOD I. Pt dresses sit to stand level from recliner at sink with supervision for UB/LB clothing. OT dons teds. Pt dons shoes with set up. Pt completes toilet transfer with CGA with quad cane to attempt to have BM with increased time. Pt completes posterior hygiene with rolator for 1UE support in standing. Pt washes hands standing at sink. In tx gym, pt completes 3x30 passes of basketball (chest, bounce, overhead) with 1.5# wrist weights to improve strength and endurance of BUE required for BADLs. Exited session with pt seated in w/c at dance group per pt request.   Therapy Documentation Precautions:  Precautions Precautions: Fall Restrictions Weight Bearing Restrictions: No General:   Vital Signs: Therapy Vitals Pulse Rate: 69 BP: (!) 121/95 Pain: Pain Assessment Pain Scale: 0-10 Pain Score: 0-No pain rrent Functional Status.   Therapy/Group: Individual Therapy  Tonny Branch 01/15/2018, 10:15 AM

## 2018-01-15 NOTE — Plan of Care (Signed)
  Problem: RH PAIN MANAGEMENT Goal: RH STG PAIN MANAGED AT OR BELOW PT'S PAIN GOAL Description Pt will manage pain at 3 or less on a scale of 0-10.   Outcome: Progressing

## 2018-01-15 NOTE — Progress Notes (Signed)
[facility] PHYSICAL MEDICINE & REHABILITATION     PROGRESS NOTE    Subjective/Complaints: Patient seen lying in bed this morning. He states he slept well overnight. He denies complaints.  ROS: denies CP, SOB, N/V/D  Objective:  No results found. No results for input(s): WBC, HGB, HCT, PLT in the last 72 hours. No results for input(s): NA, K, CL, GLUCOSE, BUN, CREATININE, CALCIUM in the last 72 hours.  Invalid input(s): CO CBG (last 3)  Recent Labs    01/14/18 1142 01/14/18 1631 01/14/18 2114  GLUCAP 130* 103* 144*    Wt Readings from Last 3 Encounters:  01/15/18 (!) 164.3 kg  01/04/18 (!) 155.7 kg  11/22/17 (!) 164.7 kg     Intake/Output Summary (Last 24 hours) at 01/15/2018 0705 Last data filed at 01/15/2018 0434 Gross per 24 hour  Intake 640 ml  Output 900 ml  Net -260 ml    Vital Signs: Blood pressure 105/80, pulse 78, temperature 98 F (36.7 C), temperature source Oral, resp. rate 18, weight (!) 164.3 kg, SpO2 93 %. Physical Exam:  Constitutional: No distress . Vital signs reviewed. HENT: Normocephalic.  Atraumatic. Eyes: EOMI. No discharge. Cardiovascular: irregularly irregular. No JVD. Respiratory: CTA bilaterally. Normal effort. GI: BS +. Non-distended. Musc: lower extremity edema. No tenderness in extremities. Neurological: He isalertand oriented Follows full commands.  Motor: 4+/5 throughout (stable) Skin: Skin iswarmand dry.intact. Psych: pleasant and appropriate.  Assessment/Plan: 1. Functional deficits secondary to cervical cord compression, lumbar stenosis/radic, left temporal lobe infarct which require 3+ hours per day of interdisciplinary therapy in a comprehensive inpatient rehab setting. Physiatrist is providing close team supervision and 24 hour management of active medical problems listed below. Physiatrist and rehab team continue to assess barriers to discharge/monitor patient progress toward functional and medical  goals.  Function:  Bathing Bathing position Bathing activity did not occur: Refused Position: Production manager parts bathed by patient: Right arm, Left arm, Chest, Front perineal area, Abdomen, Right upper leg, Left upper leg, Right lower leg, Left lower leg, Buttocks, Back Body parts bathed by helper: Buttocks, Back  Bathing assist Assist Level: Assistive device, Touching or steadying assistance(Pt > 75%) Assistive Device Comment: LH sponge    Upper Body Dressing/Undressing Upper body dressing   What is the patient wearing?: Pull over shirt/dress     Pull over shirt/dress - Perfomed by patient: Thread/unthread right sleeve, Put head through opening, Thread/unthread left sleeve, Pull shirt over trunk          Upper body assist Assist Level: Supervision or verbal cues   Set up : To obtain clothing/put away  Lower Body Dressing/Undressing Lower body dressing   What is the patient wearing?: Pants, Shoes, [employer], Underwear Underwear - Performed by patient: Thread/unthread left underwear leg, Thread/unthread right underwear leg, Pull underwear up/down Underwear - Performed by helper: Thread/unthread right underwear leg, Pull underwear up/down Pants- Performed by patient: Thread/unthread left pants leg, Thread/unthread right pants leg, Pull pants up/down Pants- Performed by helper: Thread/unthread right pants leg   Non-skid slipper socks- Performed by helper: Don/doff right sock, Don/doff left sock Socks - Performed by patient: Don/doff right sock, Don/doff left sock   Shoes - Performed by patient: Don/doff right shoe, Fasten right, Fasten left, Don/doff left shoe Shoes - Performed by helper: Don/doff right shoe, Don/doff left shoe, Fasten right, Fasten left       TED Hose - Performed by helper: Don/doff right TED hose, Don/doff left TED hose  Lower body assist  Assist for lower body dressing: Set up, Touching or steadying assistance (Pt > 75%)   Set up : Don/doff TED  stockings  Toileting Toileting Toileting activity did not occur: No continent bowel/bladder event Toileting steps completed by patient: Adjust clothing prior to toileting, Performs perineal hygiene, Adjust clothing after toileting Toileting steps completed by helper: Adjust clothing prior to toileting, Performs perineal hygiene, Adjust clothing after toileting Toileting Assistive Devices: Grab bar or rail  Toileting assist Assist level: Supervision or verbal cues   Transfers Chair/bed transfer Chair/bed transfer activity did not occur: Refused Chair/bed transfer method: Stand pivot, Ambulatory Chair/bed transfer assist level: Supervision or verbal cues Chair/bed transfer assistive device: Environmental manager lift: Stedy(per PT documentation)   Locomotion Ambulation Ambulation activity did not occur: Safety/medical concerns   Max distance: 200' Assist level: Supervision or verbal cues   Wheelchair   Type: Manual Max wheelchair distance: >500 Assist Level: Supervision or verbal cues  Cognition Comprehension Comprehension assist level: Follows basic conversation/direction with no assist  Expression Expression assist level: Expresses basic needs/ideas: With no assist  Social Interaction Social Interaction assist level: Interacts appropriately 90% of the time - Needs monitoring or encouragement for participation or interaction.  Problem Solving Problem solving assist level: Solves basic 90% of the time/requires cueing < 10% of the time  Memory Memory assist level: Recognizes or recalls 90% of the time/requires cueing < 10% of the time   Medical Problem List and Plan: 1.Decreased functional mobilitysecondary to cervical stenosis with cord compression, lumbar spondylosis with radiculopathy, left temporal lobe infarction.Patient is to follow-up outpatient with Dr. Kathyrn Sheriff to discuss possible surgical interventions for lumbar radiculopathy  -continue CIR  -will need outpt follow up for  neck/back. 2. DVT Prophylaxis/Anticoagulation: Xarelto.   3. Pain Management:Robaxin 500 mg 3 times daily, oxycodone as needed  -continue kpad for back 4. Mood:Provide emotional support 5. Neuropsych: This patientiscapable of making decisions on hisown behalf. 6. Skin/Wound Care:Routine skin checks 7. Fluids/Electrolytes/Nutrition:monitor ins and outs  8. Atrial fibrillation. Cardiac rate controlled. Amiodarone 200 mg twice daily,Toprol 100mg  daily Vitals:   01/14/18 1918 01/15/18 0436  BP: 116/90 105/80  Pulse: 90 78  Resp: 18 18  Temp: 98.1 F (36.7 C) 98 F (36.7 C)  SpO2: 95% 93%   Rate is controlled 8/25 9.Chronic systolic and diastolic congestive heart failure. Monitor for any signs of fluid overload. Lasix 40 mg Daily  - Filed Weights   01/13/18 0607 01/14/18 0432 01/15/18 0435  Weight: (!) 160.4 kg (!) 162.7 kg (!) 164.3 kg    -? Trending up on 8/25 10.LLLpneumonia. Follow-up chest x-ray 01/03/2018 with no acute process. Completed course of Omnicef and doxycycline 11. Diabetes mellitus with peripheral neuropathy. Hemoglobin A1c 8.4. SSI. Check blood sugars before meals and at bedtime. Patient on Glucophage 1000 mg twice daily prior to admission currently on hold due to renal insufficiency  - CBG (last 3)  Recent Labs    01/14/18 1142 01/14/18 1631 01/14/18 2114  GLUCAP 130* 103* 144*   Relatively controlled on 8/25 12.CKD stage III.   Creatinine 1.94 on 8/19  Labs ordered for tomorrow 13.Hyperlipidemia. Lovaza/Lipitor 14.Morbid obesity. BMI 51.99. Dietary follow-up 15.Constipation. Laxative assistance 16. Gout left great toe: improved, now off steroids   Push fluids, dietary considerations have been discussed   LOS (Days) 11 A FACE TO FACE EVALUATION WAS PERFORMED  Ankit Lorie Phenix, MD 01/15/2018 7:05 AM

## 2018-01-16 ENCOUNTER — Inpatient Hospital Stay (HOSPITAL_COMMUNITY): Payer: BC Managed Care – PPO | Admitting: Physical Therapy

## 2018-01-16 ENCOUNTER — Inpatient Hospital Stay (HOSPITAL_COMMUNITY): Payer: BC Managed Care – PPO | Admitting: Occupational Therapy

## 2018-01-16 LAB — CBC WITH DIFFERENTIAL/PLATELET
ABS IMMATURE GRANULOCYTES: 0 10*3/uL (ref 0.0–0.1)
BASOS PCT: 1 %
Basophils Absolute: 0.1 10*3/uL (ref 0.0–0.1)
Eosinophils Absolute: 0.1 10*3/uL (ref 0.0–0.7)
Eosinophils Relative: 1 %
HCT: 40.2 % (ref 39.0–52.0)
HEMOGLOBIN: 13 g/dL (ref 13.0–17.0)
IMMATURE GRANULOCYTES: 1 %
LYMPHS PCT: 18 %
Lymphs Abs: 1.2 10*3/uL (ref 0.7–4.0)
MCH: 30.9 pg (ref 26.0–34.0)
MCHC: 32.3 g/dL (ref 30.0–36.0)
MCV: 95.5 fL (ref 78.0–100.0)
MONO ABS: 0.8 10*3/uL (ref 0.1–1.0)
Monocytes Relative: 13 %
NEUTROS ABS: 4.3 10*3/uL (ref 1.7–7.7)
NEUTROS PCT: 66 %
PLATELETS: 320 10*3/uL (ref 150–400)
RBC: 4.21 MIL/uL — ABNORMAL LOW (ref 4.22–5.81)
RDW: 14.2 % (ref 11.5–15.5)
WBC: 6.5 10*3/uL (ref 4.0–10.5)

## 2018-01-16 LAB — BASIC METABOLIC PANEL
ANION GAP: 7 (ref 5–15)
BUN: 33 mg/dL — ABNORMAL HIGH (ref 6–20)
CHLORIDE: 100 mmol/L (ref 98–111)
CO2: 33 mmol/L — ABNORMAL HIGH (ref 22–32)
Calcium: 9.1 mg/dL (ref 8.9–10.3)
Creatinine, Ser: 1.75 mg/dL — ABNORMAL HIGH (ref 0.61–1.24)
GFR calc Af Amer: 50 mL/min — ABNORMAL LOW (ref >60)
GFR, EST NON AFRICAN AMERICAN: 43 mL/min — AB (ref >60)
Glucose, Bld: 136 mg/dL — ABNORMAL HIGH (ref 70–99)
POTASSIUM: 4.4 mmol/L (ref 3.5–5.1)
SODIUM: 140 mmol/L (ref 135–145)

## 2018-01-16 LAB — GLUCOSE, CAPILLARY
GLUCOSE-CAPILLARY: 145 mg/dL — AB (ref 70–99)
GLUCOSE-CAPILLARY: 101 mg/dL — AB (ref 70–99)
GLUCOSE-CAPILLARY: 106 mg/dL — AB (ref 70–99)
Glucose-Capillary: 115 mg/dL — ABNORMAL HIGH (ref 70–99)

## 2018-01-16 MED ORDER — ALLOPURINOL 100 MG PO TABS: 100.0000 mg | ORAL_TABLET | Freq: Every day | ORAL | 0 refills | Status: DC

## 2018-01-16 MED ORDER — RIVAROXABAN 20 MG PO TABS: 20.0000 mg | ORAL_TABLET | Freq: Every day | ORAL | 1 refills | Status: DC

## 2018-01-16 MED ORDER — OXYCODONE HCL 10 MG PO TABS: 10.0000 mg | ORAL_TABLET | ORAL | 0 refills | Status: DC | PRN

## 2018-01-16 MED ORDER — FUROSEMIDE 40 MG PO TABS: 40.0000 mg | ORAL_TABLET | Freq: Every day | ORAL | 1 refills | Status: DC

## 2018-01-16 MED ORDER — METOPROLOL SUCCINATE ER 100 MG PO TB24: 100.0000 mg | ORAL_TABLET | Freq: Every day | ORAL | 1 refills | Status: DC

## 2018-01-16 MED ORDER — ATORVASTATIN CALCIUM 80 MG PO TABS: 80.0000 mg | ORAL_TABLET | Freq: Every day | ORAL | 1 refills | Status: DC

## 2018-01-16 MED ORDER — OMEGA-3-ACID ETHYL ESTERS 1 G PO CAPS: 2.0000 g | ORAL_CAPSULE | Freq: Two times a day (BID) | ORAL | 11 refills | Status: AC

## 2018-01-16 MED ORDER — METHOCARBAMOL 500 MG PO TABS: 500.0000 mg | ORAL_TABLET | Freq: Three times a day (TID) | ORAL | 0 refills | Status: DC

## 2018-01-16 MED ORDER — AMIODARONE HCL 200 MG PO TABS: 200.0000 mg | ORAL_TABLET | Freq: Every day | ORAL | 1 refills | Status: DC

## 2018-01-16 MED ORDER — AMIODARONE HCL 200 MG PO TABS
200.0000 mg | ORAL_TABLET | Freq: Every day | ORAL | Status: DC
  Administered 2018-01-17: 200 mg via ORAL
  Filled 2018-01-16: qty 1

## 2018-01-16 MED ORDER — MAGNESIUM OXIDE 400 MG PO TABS: 400.0000 mg | ORAL_TABLET | Freq: Every day | ORAL | 1 refills | Status: DC

## 2018-01-16 MED ORDER — POTASSIUM CHLORIDE CRYS ER 20 MEQ PO TBCR: 40.0000 meq | EXTENDED_RELEASE_TABLET | Freq: Every day | ORAL | 1 refills | Status: DC

## 2018-01-16 NOTE — Progress Notes (Signed)
Physical Therapy Session Note  Patient Details  Name: Jay Chen MRN: [id number] Date of Birth: Jan 03, 1966  Today's Date: 01/16/2018 PT Individual Time: 1000-1100 PT Individual Time Calculation (min): 60 min   Short Term Goals: Week 1:  PT Short Term Goal 1 (Week 1): Pt will perform all bed mobility with CGA  PT Short Term Goal 2 (Week 1): Pt will perform STS with mod A and LRAD  PT Short Term Goal 3 (Week 1): Pt will perform transfers with mod A and LRAD  PT Short Term Goal 4 (Week 1): Pt will ambulate 15 feet with LRAD and assist   Skilled Therapeutic Interventions/Progress Updates:    no c/o pain.  Session focus on activity tolerance, strengthening, and HEP.    Pt transfers throughout session with set up/mod I with rollator.  Toileting in room with set up assist for pt to ambulate in/out of bathroom with rollator.  Ambulation throughout unit with distant supervision, max distance 200'.  PT instructed pt in OTAGO level A strengthening exercises with 4# ankle weights for 2x10 reps of LAQ, standing hamstring curls, hip abduction, minisquats, and sit<>stand.  UEB 2x5 minutes (forward/backward) at level 2 for UE strengthening and cardiovascular endurance.  Pt returned to room at end of session and positioned in recliner with call bell in reach and needs met.    Therapy Documentation Precautions:  Precautions Precautions: Fall Restrictions Weight Bearing Restrictions: No   See Function Navigator for Current Functional Status.   Therapy/Group: Individual Therapy  [name] 01/16/2018, 11:02 AM

## 2018-01-16 NOTE — Plan of Care (Signed)
All LTGs achieved 01/16/18

## 2018-01-16 NOTE — Progress Notes (Signed)
Occupational Therapy Discharge Summary  Patient Details  Name: Jay Chen MRN: [id number] Date of Birth: [birth year]  Today's Date: 01/16/2018 OT Individual Time: 8242-3536 OT Individual Time Calculation (min): 55 min   Patient has met 8 of 8 long term goals due to improved activity tolerance, improved balance, postural control and improved coordination.  Patient to discharge at overall Supervision level.  Patient's care partner is independent to provide the necessary cognitive assistance at discharge and has attended family education.    All goals met.   Recommendation:  Patient will benefit from ongoing skilled OT services in home health setting to continue to advance functional skills in the area of BADL and iADL.  Equipment: Bariatric TTB + 3:1  Reasons for discharge: treatment goals met and discharge from hospital  Patient/family agrees with progress made and goals achieved: Yes   Skilled Therapeutic Intervention:  Pt greeted supine in bed. Back pain reported to be manageable without medicinal interventions from RN. He was amenable to shower. Tx focus on balance, functional transfers, and activity tolerance during bathing, dressing, and toileting tasks. He completed all bathroom transfers using rollator at ambulatory level with supervision. Pt showered with supervision sit<stand and using LH sponge as needed. After shower, pt with incontinent BM on floor. He ambulated short distance using grab bars to toilet to resume BM. Hygiene completed while standing in shower with close supervision for balance and cues for thoroughness. Advised him to use pull-ups at home for a while at home in case bowel incontinence persisted. Afterwards he dressed sit<stand from EOB with min vcs for DME safety. Oral care/grooming tasks completed while standing at sink with increased time. He then returned to recliner and was left with k-pad for back. All needs within reach.   Precautions/Restrictions     Fall Pain Pain Assessment Pain Score: 0-No pain ADL ADL ADL Comments: Please see functional navigator for ADL status Vision Baseline Vision/History: Wears glasses Wears Glasses: Reading only Patient Visual Report: No change from baseline Vision Assessment?: No apparent visual deficits Perception   Perception: Intact Praxis Praxis: Intact Cognition Orientation Level: Oriented X4 Sensation Sensation Light Touch: Appears Intact Hot/Cold: Appears Intact Proprioception: Appears Intact Stereognosis: Not tested Coordination Gross Motor Movements are Fluid and Coordinated: Yes Fine Motor Movements are Fluid and Coordinated: Yes Coordination and Movement Description: gross motor affected by weight, however able to meet motor demands for self care tasks without physical assist Motor  Motor Motor - Discharge Observations: generalized weakness + decreased cardiorespiratory endurance, improved from time of eval Mobility  Transfers Sit to Stand: Supervision/Verbal cueing Stand to Sit: Supervision/Verbal cueing  Comments: during functional tasks using rollator Trunk/Postural Assessment  Cervical Assessment Cervical Assessment: Exceptions to WFL(forward head) Thoracic Assessment Thoracic Assessment: Exceptions to WFL(rounded shoulders) Lumbar Assessment Lumbar Assessment: Exceptions to WFL(posterior pelvic tilt) Postural Control Postural Control: Within Functional Limits  Balance Balance Balance Assessed: Yes Dynamic Sitting Balance Dynamic Sitting - Balance Support: Feet supported;No upper extremity supported Dynamic Sitting - Level of Assistance: 5: Stand by assistance Dynamic Sitting - Balance Activities: Forward lean/weight shifting;Lateral lean/weight shifting Sitting balance - Comments: Bathing on TTB Dynamic Standing Balance Dynamic Standing - Balance Support: No upper extremity supported;During functional activity Dynamic Standing - Level of Assistance: 5: Stand by  assistance Dynamic Standing - Balance Activities: Lateral lean/weight shifting;Forward lean/weight shifting Dynamic Standing - Comments: LB dressing Extremity/Trunk Assessment RUE Assessment RUE Assessment: Within Functional Limits  LUE Assessment:  L UE Assessment: Within Functional Limits   See Function Navigator  for Current Functional Status.  Knight Oelkers A Narjis Mira 01/16/2018, 12:15 PM

## 2018-01-16 NOTE — Discharge Summary (Signed)
Discharge summary job [phone]

## 2018-01-16 NOTE — Progress Notes (Signed)
Physical Therapy Discharge Summary  Patient Details  Name: Jay Chen MRN: [id number] Date of Birth: May 19, 1966  Today's Date: 01/16/2018 PT Individual Time: 0737-1062 PT Individual Time Calculation (min): 70 min    Patient has met 10 of 10 long term goals due to improved activity tolerance, improved balance, improved postural control, increased strength, increased range of motion, ability to compensate for deficits, improved attention, improved awareness and improved coordination.  Patient to discharge at an ambulatory level Supervision.   Patient's care partner is independent to provide the necessary supervision assistance at discharge.  Reasons goals not met: n/a  Recommendation:  Patient will benefit from ongoing skilled PT services in home health setting to continue to advance safe functional mobility, address ongoing impairments in balance and activity tolerance, and minimize fall risk.  Equipment: rollator  Reasons for discharge: treatment goals met  Patient/family agrees with progress made and goals achieved: Yes   Skilled PT Intervention: No c/o pain at rest.  Session focus on d/c assessment and patient education for upcoming d/c.  Pt transfers throughout session with set up/mod I, ambulation throughout unit distant supervision max distance 300', car transfer supervision, and bed mobility mod I.  PT administered BERG Balance Scale and patient demonstrates moderate fall risk as noted by score of 48/56 on Berg Balance Scale.  (<36= high risk for falls, close to 100%; 37-45 significant >80%; 46-51 moderate >50%; 52-55 lower >25%).  Recommending use of rollator in home and community and f/u with HHPT to advance balance and mobility.  Pt returned to room at end of session and positioned upright in recliner with call bell in reach and needs met.    PT Discharge Precautions/Restrictions Precautions Precautions: Fall Pain Pain Assessment Pain Scale: 0-10 Cognition Overall  Cognitive Status: Within Functional Limits for tasks assessed Arousal/Alertness: Awake/alert Orientation Level: Oriented X4 Attention: Selective Selective Attention: Appears intact Memory: Appears intact Awareness: Appears intact Problem Solving: Appears intact Safety/Judgment: Appears intact Sensation Sensation Light Touch: Appears Intact Hot/Cold: Appears Intact Proprioception: Appears Intact Stereognosis: Not tested Coordination Gross Motor Movements are Fluid and Coordinated: Yes Fine Motor Movements are Fluid and Coordinated: Yes Coordination and Movement Description: gross motor affected by weight, however able to meet motor demands for self care tasks without physical assist Motor  Motor Motor - Discharge Observations: generalized weakness + decreased cardiorespiratory endurance, improved from time of eval  Mobility Bed Mobility Rolling Right: Independent with assistive device Rolling Left: Independent with assistive device Supine to Sit: Independent with assistive device Transfers Transfers: Sit to Stand;Stand to Sit;Stand Pivot Transfers(functional bathroom transfers) Sit to Stand: Independent with assistive device Stand to Sit: Independent with assistive device Stand Pivot Transfers: Set up assist Stand Pivot Transfer Details: Verbal cues for safe use of DME/AE;Verbal cues for precautions/safety Transfer (Assistive device): 4-wheeled walker Locomotion  Gait Ambulation: Yes Gait Assistance: Supervision/Verbal cueing Gait Distance (Feet): 300 Feet Assistive device: 4-wheeled walker Gait Gait: Yes Gait Pattern: Wide base of support Stairs / Additional Locomotion Stairs: Yes Stairs Assistance: Supervision/Verbal cueing Stair Management Technique: Two rails Number of Stairs: 12 Wheelchair Mobility Wheelchair Mobility: No  Trunk/Postural Assessment  Cervical Assessment Cervical Assessment: Exceptions to WFL(mild forward head) Thoracic Assessment Thoracic  Assessment: Exceptions to WFL(slightly rounded shoulders, able to correct) Lumbar Assessment Lumbar Assessment: Within Functional Limits Postural Control Postural Control: Within Functional Limits  Balance Balance Balance Assessed: Yes Standardized Balance Assessment Standardized Balance Assessment: Berg Balance Test Berg Balance Test Sit to Stand: Able to stand without using hands and stabilize  independently Standing Unsupported: Able to stand safely 2 minutes Sitting with Back Unsupported but Feet Supported on Floor or Stool: Able to sit safely and securely 2 minutes Stand to Sit: Sits safely with minimal use of hands Transfers: Able to transfer safely, minor use of hands Standing Unsupported with Eyes Closed: Able to stand 10 seconds safely Standing Ubsupported with Feet Together: Able to place feet together independently and stand 1 minute safely(limited by body habitus) From Standing, Reach Forward with Outstretched Arm: Can reach confidently >25 cm (10") From Standing Position, Pick up Object from Floor: Able to pick up shoe safely and easily From Standing Position, Turn to Look Behind Over each Shoulder: Looks behind from both sides and weight shifts well Turn 360 Degrees: Able to turn 360 degrees safely but slowly Standing Unsupported, Alternately Place Feet on Step/Stool: Able to stand independently and complete 8 steps >20 seconds Standing Unsupported, One Foot in Front: Able to take small step independently and hold 30 seconds Standing on One Leg: Tries to lift leg/unable to hold 3 seconds but remains standing independently Total Score: 48 Extremity Assessment      RLE Assessment RLE Assessment: Within Functional Limits Active Range of Motion (AROM) Comments: WFL assessed in sitting General Strength Comments: 5/5 throughout LLE Assessment LLE Assessment: Within Functional Limits Active Range of Motion (AROM) Comments: WFL assessed in sitting General Strength Comments:  5/5 throughout   See Function Navigator for Current Functional Status.  [name] 01/16/2018, 3:51 PM

## 2018-01-16 NOTE — Discharge Summary (Signed)
NAME: Jay Chen, Jay Chen MEDICAL RECORD [id number] ACCOUNT [id number] DATE OF BIRTH:Apr 30, 1966 FACILITY: [facility] LOCATION: [facility] PHYSICIAN:ZACHARY SWARTZ, MD  DISCHARGE SUMMARY  DATE OF DISCHARGE:  01/17/2018  DATE OF ADMIT:  01/04/2018  DATE OF DISCHARGE:  01/17/2018   DISCHARGE DIAGNOSES: 1.  Cervical stenosis with cord compression, lumbar spondylosis with radiculopathy, left temporal lobe infarction. 2.  Deep venous thrombosis prophylaxis with Xarelto. 3.  Pain management. 4.  Persistent atrial fibrillation. 5.  Chronic systolic and diastolic congestive heart failure. 6.  Left lower lobe pneumonia. 7.  Diabetes mellitus. 8.  Chronic kidney disease, stage III. 9.  Hyperlipidemia.   10.  Morbid obesity. 11.  Constipation. 12.  Gout.  HOSPITAL COURSE:  A 52 year old right-handed male with history of diastolic congestive heart failure, CKD stage III, diabetes mellitus, hypertension, morbid obesity, who presented 12/28/2017 with right-sided flank pain, bilateral lower extremity  weakness.  He lives with his children, ages 86 and 4.  Independent prior to admission.  Chest x-ray showed mild vascular congestion, borderline cardiomegaly as well as suspected opacity at the left lung base, could not exclude pneumonia, placed on broad  spectrum antibiotics.  CT renal stone study negative.  Ultrasound of aorta without abdominal aortic aneurysm noted.  MRI of the brain showed small acute posterior temporal lobe infarction.  MRI thoracic, cervical and lumbar spine showed L4-L5  broad-based disk bulge, moderate bilateral facet arthropathy with facet effusions.  L5-S1 moderate right facet arthropathy.  MRI cervical spine with C4-C5 severe cord compression due to disk protrusion and cord edema.  Echocardiogram with ejection  fraction 25%.  Carotid Doppler studies negative.  Close monitoring of renal function 2.31-2.72.  Cardiology service is consulted in regards to congestive heart failure,  persistent atrial fibrillation.  Xarelto was added for atrial fibrillation for  cardiac rate control.  He was completing a broad spectrum course of antibiotics for left lower lobe pneumonia.  Neurology consulted.  Maintained on a prednisone Dosepak for his severe spinal cord compression and lumbar radiculopathy.  Plan was to follow  up outpatient with Dr. Kathyrn Sheriff to discuss possible surgical intervention.  The patient was admitted for a comprehensive rehabilitation program.  PAST MEDICAL HISTORY:  See discharge diagnoses.  SOCIAL HISTORY:  Lives with children ages 65 and 39.  Independent prior to admission.  FUNCTIONAL STATUS:  Upon admission to rehab services was +2 physical assist for rolling into bed, max total assist with activities of daily living.  PHYSICAL EXAMINATION: VITAL SIGNS:  Blood pressure 121/98, pulse 97, temperature 98, respirations 18. GENERAL:  Alert male in no acute distress, follows commands.  Fair awareness of deficits. HEENT:  EOMs intact. NECK:  Supple, nontender, no JVD. CARDIOVASCULAR:  Rate controlled. ABDOMEN:  Soft, nontender, good bowel sounds. LUNGS:  Clear to auscultation without wheeze.  REHABILITATION HOSPITAL COURSE:  The patient was admitted to inpatient rehabilitation services.  Therapies initiated on a 3-hour daily basis, consisting of physical therapy, occupational therapy and rehabilitation nursing.  The following issues were  addressed during patient's rehabilitation stay.  Pertaining to the patient's cervical stenosis with cord compression and radiculopathy, he would follow up outpatient with neurosurgery to discuss surgical interventions.  He remained on Xarelto for both  CVA prophylaxis and persistent atrial fibrillation followed by neurology services.  He exhibited no other signs of fluid overload.  No chest pain.  Follow up outpatient cardiology services.  Blood pressure has overall remained controlled.  He completed a  broad spectrum course of  antibiotics for left lower lobe  pneumonia.  Diabetes mellitus, peripheral neuropathy.  Hemoglobin A1c of 8.4.  Blood sugars monitored.  Full diabetic teaching.  CKD stage III.  Creatinine stable at 1.9.  Morbid obesity, BMI  51.99.  Dietary followup.  He did develop some left great toe gout, improved with steroid course.  The patient received weekly collaborative interdisciplinary team conferences to discuss estimated length of stay, family teaching, any barriers to his  discharge.  Working with simulated car transfers, steps, ambulation on level tile using a quad cane, sequencing using a quad cane as well as bed mobility in the ADL apartment with supervision.  Activities of daily living and homemaking.  Toilet transfers  using assistive device.  His wife was able to demonstrate supervision for bedside commode.  Full family teaching was completed and planned discharge to home.  DISCHARGE MEDICATIONS:  Included amiodarone 200 mg p.o. daily., Lipitor 80 mg p.o. daily, Lasix 40 mg p.o. daily, Robaxin 500 mg t.i.d., Toprol-XL 100 mg p.o. daily, Lovaza 2 grams p.o. b.i.d., MiraLax daily, Xarelto 20 mg daily, Senokot-S 2 tablets at  bedtime, oxycodone 10 mg every 4 hours as needed for pain.    DIET:  His diet was a diabetic diet.  FOLLOWUP:  The patient would follow up with Dr. Alger Simons at the outpatient rehab service office as advised; Dr. Kathyrn Sheriff to discuss surgical intervention; Dr. Erlinda Hong of [facility] Neurology services 6 weeks, call for appointment; Dr. Quay Burow cardiology  services; Dr. Scarlette Calico medical management.  TN/NUANCE D:01/16/2018 T:01/16/2018 JOB:[id number]

## 2018-01-16 NOTE — Progress Notes (Signed)
PHYSICAL MEDICINE & REHABILITATION     PROGRESS NOTE    Subjective/Complaints: No new complaints. Had a good weekend. Excited about going home  ROS: Patient denies fever, rash, sore throat, blurred vision, nausea, vomiting, diarrhea, cough, shortness of breath or chest pain, joint or back pain, headache, or mood change.    Objective:  No results found. No results for input(s): WBC, HGB, HCT, PLT in the last 72 hours. No results for input(s): NA, K, CL, GLUCOSE, BUN, CREATININE, CALCIUM in the last 72 hours.  Invalid input(s): CO CBG (last 3)  Recent Labs    01/15/18 1637 01/15/18 2101 01/16/18 0639  GLUCAP 97 110* 145*    Wt Readings from Last 3 Encounters:  01/16/18 (!) 163.3 kg  01/04/18 (!) 155.7 kg  11/22/17 (!) 164.7 kg     Intake/Output Summary (Last 24 hours) at 01/16/2018 0854 Last data filed at 01/15/2018 2300 Gross per 24 hour  Intake 460 ml  Output 775 ml  Net -315 ml    Vital Signs: Blood pressure 118/72, pulse 89, temperature 98.4 F (36.9 C), temperature source Oral, resp. rate 18, weight (!) 163.3 kg, SpO2 94 %. Physical Exam:  Constitutional: No distress . Vital signs reviewed. HEENT: EOMI, oral membranes moist Neck: supple Cardiovascular: IRR without murmur. No JVD    Respiratory: CTA Bilaterally without wheezes or rales. Normal effort    GI: BS +, non-tender, non-distended  Musc: lower extremity edema. No tenderness in extremities. Neurological: He isalertand oriented Follows full commands.  Motor: 4+/5 throughout (stable) Skin: Skin iswarmand dry.intact. Psych: pleasant and appropriate.  Assessment/Plan: 1. Functional deficits secondary to cervical cord compression, lumbar stenosis/radic, left temporal lobe infarct which require 3+ hours per day of interdisciplinary therapy in a comprehensive inpatient rehab setting. Physiatrist is providing close team supervision and 24 hour management of active medical problems listed  below. Physiatrist and rehab team continue to assess barriers to discharge/monitor patient progress toward functional and medical goals.  Function:  Bathing Bathing position Bathing activity did not occur: Refused Position: Production manager parts bathed by patient: Right arm, Left arm, Chest, Front perineal area, Abdomen, Right upper leg, Left upper leg, Right lower leg, Left lower leg, Buttocks, Back Body parts bathed by helper: Buttocks, Back  Bathing assist Assist Level: Assistive device, Touching or steadying assistance(Pt > 75%) Assistive Device Comment: LH sponge    Upper Body Dressing/Undressing Upper body dressing   What is the patient wearing?: Pull over shirt/dress     Pull over shirt/dress - Perfomed by patient: Thread/unthread right sleeve, Put head through opening, Thread/unthread left sleeve, Pull shirt over trunk          Upper body assist Assist Level: Supervision or verbal cues   Set up : To obtain clothing/put away  Lower Body Dressing/Undressing Lower body dressing   What is the patient wearing?: Pants, Shoes, [employer], Underwear Underwear - Performed by patient: Thread/unthread left underwear leg, Thread/unthread right underwear leg, Pull underwear up/down Underwear - Performed by helper: Thread/unthread right underwear leg, Pull underwear up/down Pants- Performed by patient: Thread/unthread left pants leg, Thread/unthread right pants leg, Pull pants up/down Pants- Performed by helper: Thread/unthread right pants leg   Non-skid slipper socks- Performed by helper: Don/doff right sock, Don/doff left sock Socks - Performed by patient: Don/doff right sock, Don/doff left sock   Shoes - Performed by patient: Don/doff right shoe, Fasten right, Fasten left, Don/doff left shoe Shoes - Performed by helper: Don/doff right shoe,  Don/doff left shoe, Fasten right, Fasten left       TED Hose - Performed by helper: Don/doff right TED hose, Don/doff left TED  hose  Lower body assist Assist for lower body dressing: Set up, Touching or steadying assistance (Pt > 75%)   Set up : Don/doff TED stockings  Toileting Toileting Toileting activity did not occur: No continent bowel/bladder event Toileting steps completed by patient: Adjust clothing prior to toileting, Performs perineal hygiene, Adjust clothing after toileting Toileting steps completed by helper: Adjust clothing prior to toileting, Performs perineal hygiene, Adjust clothing after toileting Toileting Assistive Devices: Grab bar or rail  Toileting assist Assist level: Supervision or verbal cues   Transfers Chair/bed transfer Chair/bed transfer activity did not occur: Refused Chair/bed transfer method: Stand pivot, Ambulatory Chair/bed transfer assist level: Supervision or verbal cues Chair/bed transfer assistive device: Environmental manager lift: Stedy(per PT documentation)   Locomotion Ambulation Ambulation activity did not occur: Safety/medical concerns   Max distance: 200' Assist level: Supervision or verbal cues   Wheelchair   Type: Manual Max wheelchair distance: >500 Assist Level: Supervision or verbal cues  Cognition Comprehension Comprehension assist level: Follows basic conversation/direction with no assist  Expression Expression assist level: Expresses basic needs/ideas: With no assist  Social Interaction Social Interaction assist level: Interacts appropriately 90% of the time - Needs monitoring or encouragement for participation or interaction.  Problem Solving Problem solving assist level: Solves basic 90% of the time/requires cueing < 10% of the time  Memory Memory assist level: Recognizes or recalls 90% of the time/requires cueing < 10% of the time   Medical Problem List and Plan: 1.Decreased functional mobilitysecondary to cervical stenosis with cord compression, lumbar spondylosis with radiculopathy, left temporal lobe infarction.Patient is to follow-up outpatient  with Dr. Kathyrn Sheriff to discuss possible surgical interventions for lumbar radiculopathy  -continue CIR  -dc home tomorrow  -Patient to see Rehab MD/provider in the office for transitional care encounter in 1-2 weeks.  2. DVT Prophylaxis/Anticoagulation: Xarelto.   3. Pain Management:Robaxin 500 mg 3 times daily, oxycodone as needed  -continue kpad for back 4. Mood:Provide emotional support 5. Neuropsych: This patientiscapable of making decisions on hisown behalf. 6. Skin/Wound Care:Routine skin checks 7. Fluids/Electrolytes/Nutrition:monitor ins and outs  8. Atrial fibrillation. Cardiac rate controlled. Amiodarone 200 mg twice daily,Toprol 100mg  daily Vitals:   01/15/18 1918 01/16/18 0510  BP: (!) 135/93 118/72  Pulse: 75 89  Resp: 18 18  Temp: 97.8 F (36.6 C) 98.4 F (36.9 C)  SpO2: 98% 94%   Rate is controlled 8/26 9.Chronic systolic and diastolic congestive heart failure. Monitor for any signs of fluid overload. Lasix 40 mg Daily  - Filed Weights   01/14/18 0432 01/15/18 0435 01/16/18 0509  Weight: (!) 162.7 kg (!) 164.3 kg (!) 163.3 kg    -stable 8/26 10.LLLpneumonia. Follow-up chest x-ray 01/03/2018 with no acute process. Completed course of Omnicef and doxycycline 11. Diabetes mellitus with peripheral neuropathy. Hemoglobin A1c 8.4. SSI. Check blood sugars before meals and at bedtime. Patient on Glucophage 1000 mg twice daily prior to admission currently on hold due to renal insufficiency  - CBG (last 3)  Recent Labs    01/15/18 1637 01/15/18 2101 01/16/18 0639  GLUCAP 97 110* 145*   Relatively controlled on 8/26 12.CKD stage III.   Creatinine 1.94 on 8/19  Labs pending 13.Hyperlipidemia. Lovaza/Lipitor 14.Morbid obesity. BMI 51.99. Dietary follow-up 15.Constipation. Laxative assistance 16. Gout left great toe: improved, now off steroids   Push fluids, dietary considerations have  been discussed   LOS (Days) [place] EVALUATION WAS PERFORMED  Meredith Staggers, MD 01/16/2018 8:54 AM

## 2018-01-17 LAB — GLUCOSE, CAPILLARY: GLUCOSE-CAPILLARY: 119 mg/dL — AB (ref 70–99)

## 2018-01-17 NOTE — Progress Notes (Signed)
Pt. Got d/c instructions and equipment.Pt. Is ready to go home with family.

## 2018-01-17 NOTE — Progress Notes (Signed)
[facility] PHYSICAL MEDICINE & REHABILITATION     PROGRESS NOTE    Subjective/Complaints: Pt without issues. Feels ready to go home  ROS: Patient denies fever, rash, sore throat, blurred vision, nausea, vomiting, diarrhea, cough, shortness of breath or chest pain, joint or back pain, headache, or mood change.    Objective:  No results found. Recent Labs    01/16/18 0952  WBC 6.5  HGB 13.0  HCT 40.2  PLT 320   Recent Labs    01/16/18 0952  NA 140  K 4.4  CL 100  GLUCOSE 136*  BUN 33*  CREATININE 1.75*  CALCIUM 9.1   CBG (last 3)  Recent Labs    01/16/18 1649 01/16/18 2114 01/17/18 0634  GLUCAP 106* 115* 119*    Wt Readings from Last 3 Encounters:  01/17/18 (!) 163.4 kg  01/04/18 (!) 155.7 kg  11/22/17 (!) 164.7 kg     Intake/Output Summary (Last 24 hours) at 01/17/2018 0811 Last data filed at 01/17/2018 0538 Gross per 24 hour  Intake 720 ml  Output 1275 ml  Net -555 ml    Vital Signs: Blood pressure 131/85, pulse 100, temperature 98.7 F (37.1 C), temperature source Oral, resp. rate 20, weight (!) 163.4 kg, SpO2 90 %. Physical Exam:  Constitutional: No distress . Vital signs reviewed. HEENT: EOMI, oral membranes moist Neck: supple Cardiovascular: RRR without murmur. No JVD    Respiratory: CTA Bilaterally without wheezes or rales. Normal effort    GI: BS +, non-tender, non-distended  Musc: lower extremity edema. No tenderness in extremities. Neurological: He isalertand oriented Follows full commands.  Motor: 4+/5 throughout (no change) Skin: Skin iswarmand dry.intact. Psych: pleasant and appropriate.  Assessment/Plan: 1. Functional deficits secondary to cervical cord compression, lumbar stenosis/radic, left temporal lobe infarct which require 3+ hours per day of interdisciplinary therapy in a comprehensive inpatient rehab setting. Physiatrist is providing close team supervision and 24 hour management of active medical problems listed  below. Physiatrist and rehab team continue to assess barriers to discharge/monitor patient progress toward functional and medical goals.  Function:  Bathing Bathing position Bathing activity did not occur: Refused Position: Production manager parts bathed by patient: Right arm, Left arm, Chest, Front perineal area, Abdomen, Right upper leg, Left upper leg, Right lower leg, Left lower leg, Buttocks, Back Body parts bathed by helper: Buttocks, Back  Bathing assist Assist Level: Assistive device, Set up Assistive Device Comment: LH sponge    Upper Body Dressing/Undressing Upper body dressing   What is the patient wearing?: Pull over shirt/dress     Pull over shirt/dress - Perfomed by patient: Thread/unthread right sleeve, Put head through opening, Thread/unthread left sleeve, Pull shirt over trunk          Upper body assist Assist Level: Supervision or verbal cues   Set up : To obtain clothing/put away  Lower Body Dressing/Undressing Lower body dressing   What is the patient wearing?: Pants, Shoes, Underwear, Socks Underwear - Performed by patient: Thread/unthread left underwear leg, Thread/unthread right underwear leg, Pull underwear up/down Underwear - Performed by helper: Thread/unthread right underwear leg, Pull underwear up/down Pants- Performed by patient: Thread/unthread left pants leg, Thread/unthread right pants leg, Pull pants up/down Pants- Performed by helper: Thread/unthread right pants leg   Non-skid slipper socks- Performed by helper: Don/doff right sock, Don/doff left sock Socks - Performed by patient: Don/doff right sock, Don/doff left sock   Shoes - Performed by patient: Don/doff right shoe, Fasten right, Fasten left,  Don/doff left shoe Shoes - Performed by helper: Don/doff right shoe, Don/doff left shoe, Fasten right, Fasten left       TED Hose - Performed by helper: Don/doff right TED hose, Don/doff left TED hose  Lower body assist Assist for lower  body dressing: Supervision or verbal cues   Set up : Don/doff TED stockings  Toileting Toileting Toileting activity did not occur: No continent bowel/bladder event Toileting steps completed by patient: Adjust clothing prior to toileting, Performs perineal hygiene, Adjust clothing after toileting Toileting steps completed by helper: Adjust clothing prior to toileting, Performs perineal hygiene, Adjust clothing after toileting Toileting Assistive Devices: Grab bar or rail  Toileting assist Assist level: Supervision or verbal cues   Transfers Chair/bed transfer Chair/bed transfer activity did not occur: Refused Chair/bed transfer method: Stand pivot, Ambulatory Chair/bed transfer assist level: Set up only Chair/bed transfer assistive device: Walker, Armrests(rollator) Mechanical lift: Stedy(per PT documentation)   Locomotion Ambulation Ambulation activity did not occur: Safety/medical concerns   Max distance: 300 Assist level: Supervision or verbal cues   Wheelchair   Type: Manual Max wheelchair distance: >500 Assist Level: Supervision or verbal cues  Cognition Comprehension Comprehension assist level: Follows basic conversation/direction with no assist  Expression Expression assist level: Expresses basic needs/ideas: With no assist  Social Interaction Social Interaction assist level: Interacts appropriately 90% of the time - Needs monitoring or encouragement for participation or interaction.  Problem Solving Problem solving assist level: Solves basic 90% of the time/requires cueing < 10% of the time  Memory Memory assist level: Recognizes or recalls 90% of the time/requires cueing < 10% of the time   Medical Problem List and Plan: 1.Decreased functional mobilitysecondary to cervical stenosis with cord compression, lumbar spondylosis with radiculopathy, left temporal lobe infarction.Patient is to follow-up outpatient with Dr. Kathyrn Sheriff to discuss possible surgical interventions  for lumbar radiculopathy   -dc home today  -Patient to see Rehab MD/provider in the office for transitional care encounter in 1-2 weeks.  2. DVT Prophylaxis/Anticoagulation: Xarelto.   3. Pain Management:Robaxin 500 mg 3 times daily, oxycodone as needed  -continue kpad for back 4. Mood:Provide emotional support 5. Neuropsych: This patientiscapable of making decisions on hisown behalf. 6. Skin/Wound Care:Routine skin checks 7. Fluids/Electrolytes/Nutrition:monitor ins and outs  8. Atrial fibrillation. Cardiac rate controlled. Amiodarone 200 mg twice daily,Toprol 100mg  daily Vitals:   01/16/18 2017 01/17/18 0533  BP: (!) 136/96 131/85  Pulse: 77 100  Resp: 18 20  Temp: 98.5 F (36.9 C) 98.7 F (37.1 C)  SpO2: 97% 90%   Rate is controlled 8/27 9.Chronic systolic and diastolic congestive heart failure. Monitor for any signs of fluid overload. Lasix 40 mg Daily  - Filed Weights   01/15/18 0435 01/16/18 0509 01/17/18 0533  Weight: (!) 164.3 kg (!) 163.3 kg (!) 163.4 kg    -stable 8/27 10.LLLpneumonia. Follow-up chest x-ray 01/03/2018 with no acute process. Completed course of Omnicef and doxycycline 11. Diabetes mellitus with peripheral neuropathy. Hemoglobin A1c 8.4. SSI. Check blood sugars before meals and at bedtime. Patient on Glucophage 1000 mg twice daily prior to admission currently on hold due to renal insufficiency  - CBG (last 3)  Recent Labs    01/16/18 1649 01/16/18 2114 01/17/18 0634  GLUCAP 106* 115* 119*   Relatively controlled on 8/27 12.CKD stage III.   Creatinine 1.75 on 8/26    13.Hyperlipidemia. Lovaza/Lipitor 14.Morbid obesity. BMI 51.99. Dietary follow-up 15.Constipation. Laxative assistance 16. Gout left great toe: improved, now off steroids   Push  fluids, dietary considerations have been discussed   LOS (Days) [place] EVALUATION WAS PERFORMED  Meredith Staggers, MD 01/17/2018 8:11 AM

## 2018-01-18 ENCOUNTER — Encounter: Payer: Self-pay | Admitting: Physical Medicine & Rehabilitation

## 2018-01-18 NOTE — Progress Notes (Signed)
Social Work Discharge Note  The overall goal for the admission was met for:   Discharge location: Yes - home  Length of Stay: Yes - 13 days  Discharge activity level: Yes - supervision level  Home/community participation: Yes  Services provided included: MD, RD, PT, OT, RN, Pharmacy, Neuropsych and SW  Financial Services: Private Insurance: State [employer] Shield  Follow-up services arranged: Home Health: PT/OT from [facility], DME: bariatric 3-in-1; wide rollator; bariatric tub bench from [facility] and Patient/Family has no preference for HH/DME agencies  Comments (or additional information): Pt's wife and dtr came for family education and feel prepared to care for pt at home.  Pt's mother will assist, as well, while wife is at work.  Patient/Family verbalized understanding of follow-up arrangements: Yes  Individual responsible for coordination of the follow-up plan: pt and his wife  Confirmed correct DME delivered: Trey Sailors 01/18/2018    Chabely Norby, Silvestre Mesi

## 2018-01-19 ENCOUNTER — Telehealth: Payer: Self-pay | Admitting: *Deleted

## 2018-01-19 NOTE — Telephone Encounter (Addendum)
Transitional Care call-1st attempt to reach Mr Jay Chen 4:15 on 01/19/18, left message for Mr Jay Chen to call our office, be on look out for mailed packet with information, and appt date and time. 01/20/18 2:10 pm I spoke with [name]    1. Are you/is patient experiencing any problems since coming home? Are there any questions regarding any aspect of care? NO 2. Are there any questions regarding medications administration/dosing? Are meds being taken as prescribed? Patient should review meds with caller to confirm YES HE HAS ALL OF HIS MEDICATION 3. Have there been any falls? NO 4. Has Home Health been to the house and/or have they contacted you? If not, have you tried to contact them? Can we help you contact them? THEY HAVE CALLED BUT APPTS NOT SET YET 5. Are bowels and bladder emptying properly? Are there any unexpected incontinence issues? If applicable, is patient following bowel/bladder programs? NO PROBLEMS 6. Any fevers, problems with breathing, unexpected pain? NO 7. Are there any skin problems or new areas of breakdown? NO, FEET ARE SWOLLEN THOUGH. I ENCOURAGED HIM TO KEEP LEGS AND FEET ELEVATED WHENEVER SITTING AND TO WEAR COMPRESSION STOCKINGS FROM HOSPITAL WHILE UP. 8. Has the patient/family member arranged specialty MD follow up (ie cardiology/neurology/renal/surgical/etc)?  Can we help arrange? NO 9. Does the patient need any other services or support that we can help arrange?NO 10. Are caregivers following through as expected in assisting the patient? YES 11. Has the patient quit smoking, drinking alcohol, or using drugs as recommended?N/A    Appointment Wednesday 02/01/18 arrive by 2:20 for a 2:40 appt with Dr Naaman Plummer ADDRESS REVIEWED [place]

## 2018-01-24 ENCOUNTER — Ambulatory Visit (INDEPENDENT_AMBULATORY_CARE_PROVIDER_SITE_OTHER): Payer: BC Managed Care – PPO | Admitting: Internal Medicine

## 2018-01-24 ENCOUNTER — Telehealth: Payer: Self-pay | Admitting: Internal Medicine

## 2018-01-24 ENCOUNTER — Encounter: Payer: Self-pay | Admitting: Internal Medicine

## 2018-01-24 ENCOUNTER — Telehealth: Payer: Self-pay

## 2018-01-24 VITALS — BP 140/82 | HR 111 | Temp 98.1°F | Resp 18 | Wt 356.0 lb

## 2018-01-24 DIAGNOSIS — E1142 Type 2 diabetes mellitus with diabetic polyneuropathy: Secondary | ICD-10-CM

## 2018-01-24 DIAGNOSIS — I10 Essential (primary) hypertension: Secondary | ICD-10-CM

## 2018-01-24 DIAGNOSIS — I4891 Unspecified atrial fibrillation: Secondary | ICD-10-CM

## 2018-01-24 DIAGNOSIS — I6389 Other cerebral infarction: Secondary | ICD-10-CM | POA: Diagnosis not present

## 2018-01-24 DIAGNOSIS — E1165 Type 2 diabetes mellitus with hyperglycemia: Secondary | ICD-10-CM

## 2018-01-24 DIAGNOSIS — Z23 Encounter for immunization: Secondary | ICD-10-CM | POA: Diagnosis not present

## 2018-01-24 LAB — POCT GLYCOSYLATED HEMOGLOBIN (HGB A1C): Hemoglobin A1C: 7.1 % — AB (ref 4.0–5.6)

## 2018-01-24 LAB — GLUCOSE, POCT (MANUAL RESULT ENTRY): POC GLUCOSE: 142 mg/dL — AB (ref 70–99)

## 2018-01-24 MED ORDER — ACCU-CHEK GUIDE W/DEVICE KIT: 1.0000 | PACK | Freq: Three times a day (TID) | 0 refills | Status: DC

## 2018-01-24 MED ORDER — GLUCOSE BLOOD VI STRP: 1.0000 | ORAL_STRIP | Freq: Three times a day (TID) | 1 refills | Status: DC

## 2018-01-24 NOTE — Telephone Encounter (Signed)
Jay Chen called requesting verbal orders for PT for 1wk1, 2wk2, 1wk1 for gait training, strength and conditioning, safety and fall prevention. Verbal orders given per discharge summary.

## 2018-01-24 NOTE — Telephone Encounter (Signed)
Copied from [place] [id number]. Topic: Quick Communication - See Telephone Encounter >> Jan 24, 2018  9:49 AM Rutherford Nail, NT wrote: CRM for notification. See Telephone encounter for: 01/24/18. Ronalee Belts, occupational therapist with [facility], calling to get verbal orders on a plan of care. States that he would like to see the patient to get him back to base line. 2x a week for 3 weeks CB#: [phone]

## 2018-01-24 NOTE — Telephone Encounter (Signed)
yes

## 2018-01-24 NOTE — Telephone Encounter (Signed)
LVM with Ronalee Belts giving verbal orders for OT

## 2018-01-24 NOTE — Progress Notes (Signed)
Cardiology Office Note   Date:  01/25/2018   ID:  Jay Chen, DOB 07/02/1965, MRN [id number]  PCP:  [name], MD  Cardiologist:  [facility]  Chief Complaint  Patient presents with  . Follow-up    pt deniedc hest pain and SOB  . Atrial Fibrillation  . Cardiomyopathy  . Congestive Heart Failure     History of Present Illness: Jay Chen is a 52 y.o. male who presents for hospital follow up after admission for acute on chronic mixed CHF (EF of 20%-25%), AAA, 4.7 cm per echo, acute CVA and atrial fib. Other history includes hypertension, hyperlipidemia, diabetes, OSA and morbid obesity.   Echocardiogram demonstrated significant LV systolic function with EF of 20%-25%,  He was placed on amiodarone for HR control at 200 mg daily after loading and metoprolol. He remains on Xarelto and is to be considered for TEE DCCV after follow up as OP. He was started on lasix 40 mg daily. Consideration for RHC and LHC when renal function improved. He was to be followed in [facility] as well.   He did not follow up with [facility] as he was hospitalized, He has been medically compliant. He states that he also has OSA but has not been wearing CPAP as he could not tolerate the mask.  He is weighing daily and trying to avoid salt. He states that his weight has been "about the same every day' since getting out of the hospital.   Past Medical History:  Diagnosis Date  . CKD (chronic kidney disease), stage III ([place])   . Congestive heart failure [facility]) May of 2011   Felt to have cor pulmonale; EF 45 to 50% from echo in May 2011  . Cor pulmonale (chronic) ([place])   . Gout    "take daily RX" (12/28/2017)  . Hyperlipidemia   . Hypertension   . Morbid obesity ([place])   . Persistent atrial fibrillation ([place])    [name] 12/28/2017  . Sleep apnea    "dx'd; couldn't tolerate CPAP" (12/28/2017)  . Thoracic aneurysm    a. 4.8cm thoracic aortic aneurysm by CT 2013.  [name] Thoracic aortic  aneurysm ([place])    known/notes 12/28/2017  . Type II diabetes mellitus ([place])     Past Surgical History:  Procedure Laterality Date  . LAPAROSCOPIC CHOLECYSTECTOMY  2000  . US ECHOCARDIOGRAPHY  09/21/2009   EF 45-50%; Cavity size was severely dilated, severe concentric hypertrophy and normal wall motion     Current Outpatient Medications  Medication Sig Dispense Refill  . acetaminophen (TYLENOL) 325 MG tablet Take 2 tablets (650 mg total) by mouth every 6 (six) hours as needed for mild pain or headache (or Fever >/= 101).    [name] allopurinol (ZYLOPRIM) 100 MG tablet Take 1 tablet (100 mg total) by mouth daily. 90 tablet 0  . amiodarone (PACERONE) 200 MG tablet Take 1 tablet (200 mg total) by mouth daily. 30 tablet 1  . atorvastatin (LIPITOR) 80 MG tablet Take 1 tablet (80 mg total) by mouth daily. 30 tablet 1  . Blood Glucose Monitoring Suppl (ACCU-CHEK GUIDE) w/Device KIT 1 Act by Does not apply route 3 (three) times daily. 2 kit 0  . furosemide (LASIX) 40 MG tablet Take 1 tablet (40 mg total) by mouth daily. 90 tablet 1  . glucose blood (ACCU-CHEK GUIDE) test strip 1 each by Other route 3 (three) times daily. Use as instructed 300 each 1  . magnesium oxide (MAG-OX) 400  MG tablet Take 1 tablet (400 mg total) by mouth daily. 90 tablet 1  . methocarbamol (ROBAXIN) 500 MG tablet Take 1 tablet (500 mg total) by mouth 3 (three) times daily. 90 tablet 0  . metoprolol succinate (TOPROL-XL) 100 MG 24 hr tablet Take one and one-half tablets (151m) by mouth daily. Take with or immediately following a meal. 135 tablet 3  . omega-3 acid ethyl esters (LOVAZA) 1 g capsule Take 2 capsules (2 g total) by mouth 2 (two) times daily. 120 capsule 11  . potassium chloride SA (KLOR-CON M20) 20 MEQ tablet Take 2 tablets (40 mEq total) by mouth daily. 180 tablet 1  . rivaroxaban (XARELTO) 20 MG TABS tablet Take 1 tablet (20 mg total) by mouth daily with supper. 90 tablet 1   No current facility-administered  medications for this visit.     Allergies:   Patient has no known allergies.    Social History:  The patient  reports that he has never smoked. He has never used smokeless tobacco. He reports that he drank alcohol. He reports that he does not use drugs.   Family History:  The patient's family history includes Colon cancer in his father; Hypertension in his mother; Pancreatic cancer in his father; Prostate cancer in his father.    ROS: All other systems are reviewed and negative. Unless otherwise mentioned in H&P    PHYSICAL EXAM: VS:  BP (!) 138/100   Pulse (!) 116   Ht _0  (1.727 m)   Wt (!) 355 lb (161 kg)   BMI 53.98 kg/m  , BMI Body mass index is 53.98 kg/m. GEN: Well nourished, well developed, in no acute distress Morbidly obese.  HEENT: normal  Neck: no JVD, carotid bruits, or masses Cardiac: IRRR; distant, tachycardic, no murmurs, rubs, or gallops,no edema  Respiratory:  clear to auscultation bilaterally, normal work of breathing, diminished in the bases.  GI: soft, nontender, nondistended, + BS MS: no deformity or atrophy  Skin: warm and dry, no rash Neuro:  Strength and sensation are intact Psych: euthymic mood, full affect   EKG:  Atrial fib with RVR, incomplete RBBB,   Recent Labs: 09/01/2017: TSH 3.13 12/27/2017: B Natriuretic Peptide 283.3 01/05/2018: ALT 124 01/16/2018: BUN 33; Creatinine, Ser 1.75; Hemoglobin 13.0; Platelets 320; Potassium 4.4; Sodium 140    Lipid Panel    Component Value Date/Time   CHOL 116 0Aug 20, 20190159   TRIG 85 0August 20, 20190159   HDL 24 (L) [id number]   CHOLHDL 4.8 0Aug 20, 20190159   VLDL 17 02019/08/200159   LDLCALC 75 02019/08/200159   LDLDIRECT 104.0 10/12/2016 1118      Wt Readings from Last 3 Encounters:  01/25/18 (!) 355 lb (161 kg)  01/24/18 (!) 356 lb (161.5 kg)  01/17/18 (!) 360 lb 3.7 oz (163.4 kg)      Other studies Reviewed: Echocardiogram 82019/08/20 Left ventricle: The cavity size was moderately to  severely   dilated. Wall thickness was increased in a pattern of moderate   LVH. Systolic function was severely reduced. The estimated   ejection fraction was in the range of 20% to 25%. Diffuse   hypokinesis. The study is not technically sufficient to allow   evaluation of LV diastolic function. - Aortic valve: Mildly calcified annulus. Valve area (VTI): 4.07   cm^2. Valve area (Vmax): 4.14 cm^2. Valve area (Vmean): 4.16   cm^2. - Aorta: There is a single still frame view of a portion of the  proximal ascending that is dilated measuring 4.7 cm, consistent   with moderate to large aneurysm. From chart review a similar   aneurysm was detected by CT scan 03/28/2012. Limited evaluation by   echo, consider alternative modality if more complete evaluation   is indicated. - Left atrium: The atrium was severely dilated. - Right ventricle: The cavity size was mildly dilated. Systolic   function was mildly reduced. - Right atrium: The atrium was mildly to moderately dilated. - Atrial septum: No defect or patent foramen ovale was identified. - Inferior vena cava: The vessel was dilated. The respirophasic   diameter changes were blunted (< 50%), consistent with elevated   central venous pressure. - Technically difficult study. Echocontrast was used to enhance   visualization.   ASSESSMENT AND PLAN:  1. Atrial fib with RVR: Not well controlled today. HR much higher than optimal for decreased systolic function. I will increase his metoprolol to 150 daily from 100 mg daily. He still may be a candidate for TEE DCCV. He will be seen in a couple of weeks and will discuss this at that time and schedule.  2. OSA: Unless this is controlled, it will make it difficult for him to remain in NSR. He has not been using CPAP for over 3 years as he could not tolerate the mask. I will repeat the sleep study to re-qualify him for CPAP and then have him meet with Barry Brunner our Sleep Study nurse for planned  fitting recommendations, He continues to have daytime fatigue and DOE.   3. Chronic Systolic CHF: EF 70% on last echo. He will need more optimal BP and HR control. I have increase metoprolol as above. Due to renal insufficiency he is not a candidate for Entresto. I am repeating his BMET.  Previous note from Dr. Oval Linsey discussed the need for RHC and LHC, but renal function will need to be evaluated first.  Continue diuretics   He is also going to be rescheduled for [facility] as he was in the hospital when he was to have appointment.   4. Hypercholesterolemia: Continue statin therapy.  Current medicines are reviewed at length with the patient today.    Labs/ tests ordered today include: BMET  Phill Myron. West Pugh, ANP, AACC   01/25/2018 1:51 PM    [facility] [address] [place], [place] [address] Phone: [phone]; Fax: [phone]

## 2018-01-24 NOTE — Progress Notes (Signed)
Subjective:  Patient ID: Jay Chen, male    DOB: [birth year]  Age: 52 y.o. MRN: [id number]  CC: Atrial Fibrillation; Hypertension; and Diabetes   HPI Jay Chen presents for f/up after a complicated admission.  Was treated for pneumonia, CVA, heart failure, AKI, and atrial fibrillation with rapid response.  His only remaining deficit from the CVA is left lower extremity weakness.  He tells me this is improving with therapy.  He is using a 4 cane walker to help with his mobility.  Outpatient Medications Prior to Visit  Medication Sig Dispense Refill  . acetaminophen (TYLENOL) 325 MG tablet Take 2 tablets (650 mg total) by mouth every 6 (six) hours as needed for mild pain or headache (or Fever >/= 101).    [name] allopurinol (ZYLOPRIM) 100 MG tablet Take 1 tablet (100 mg total) by mouth daily. 90 tablet 0  . amiodarone (PACERONE) 200 MG tablet Take 1 tablet (200 mg total) by mouth daily. 30 tablet 1  . atorvastatin (LIPITOR) 80 MG tablet Take 1 tablet (80 mg total) by mouth daily. 30 tablet 1  . furosemide (LASIX) 40 MG tablet Take 1 tablet (40 mg total) by mouth daily. 90 tablet 1  . magnesium oxide (MAG-OX) 400 MG tablet Take 1 tablet (400 mg total) by mouth daily. 90 tablet 1  . methocarbamol (ROBAXIN) 500 MG tablet Take 1 tablet (500 mg total) by mouth 3 (three) times daily. 90 tablet 0  . metoprolol succinate (TOPROL-XL) 100 MG 24 hr tablet Take 1 tablet (100 mg total) by mouth daily. Take with or immediately following a meal. 30 tablet 1  . omega-3 acid ethyl esters (LOVAZA) 1 g capsule Take 2 capsules (2 g total) by mouth 2 (two) times daily. 120 capsule 11  . potassium chloride SA (KLOR-CON M20) 20 MEQ tablet Take 2 tablets (40 mEq total) by mouth daily. 180 tablet 1  . rivaroxaban (XARELTO) 20 MG TABS tablet Take 1 tablet (20 mg total) by mouth daily with supper. 90 tablet 1  . oxyCODONE 10 MG TABS Take 1 tablet (10 mg total) by mouth every 4 (four) hours as needed for moderate  pain. 30 tablet 0  . polyethylene glycol (MIRALAX / GLYCOLAX) packet Take 17 g by mouth daily as needed for mild constipation. 14 each 0  . senna-docusate (SENOKOT-S) 8.6-50 MG tablet Take 2 tablets by mouth at bedtime.     No facility-administered medications prior to visit.     ROS Review of Systems  Constitutional: Negative for diaphoresis and fatigue.  HENT: Negative.   Eyes: Negative.   Respiratory: Negative for cough, chest tightness, shortness of breath and wheezing.   Cardiovascular: Negative for chest pain, palpitations and leg swelling.  Gastrointestinal: Negative for abdominal pain, constipation, diarrhea, nausea and vomiting.  Endocrine: Negative for polydipsia, polyphagia and polyuria.  Genitourinary: Negative for difficulty urinating, dysuria and hematuria.  Musculoskeletal: Negative.  Negative for arthralgias and myalgias.  Skin: Negative.  Negative for color change.  Neurological: Positive for weakness. Negative for dizziness and light-headedness.  Hematological: Negative for adenopathy. Does not bruise/bleed easily.  Psychiatric/Behavioral: Negative.     Objective:  BP 140/82   Pulse (!) 111   Temp 98.1 F (36.7 C) (Oral)   Resp 18   Wt (!) 356 lb (161.5 kg)   SpO2 92%   BMI 54.13 kg/m   BP Readings from Last 3 Encounters:  01/24/18 140/82  01/17/18 131/85  01/04/18 (!) 90/56    Wt Readings  from Last 3 Encounters:  01/24/18 (!) 356 lb (161.5 kg)  01/17/18 (!) 360 lb 3.7 oz (163.4 kg)  01/04/18 (!) 343 lb 4.1 oz (155.7 kg)    Physical Exam  Constitutional: He is oriented to person, place, and time. No distress.  HENT:  Mouth/Throat: Oropharynx is clear and moist. No oropharyngeal exudate.  Eyes: Conjunctivae are normal. No scleral icterus.  Neck: Normal range of motion. Neck supple. No JVD present. No thyromegaly present.  Cardiovascular: S1 normal and S2 normal. An irregularly irregular rhythm present. Tachycardia present. Exam reveals no gallop.    No murmur heard. Pulmonary/Chest: Effort normal and breath sounds normal. He has no wheezes. He has no rhonchi. He has no rales.  Abdominal: Soft. Normal appearance and bowel sounds are normal. He exhibits no mass. There is no hepatosplenomegaly. There is no tenderness.  Musculoskeletal: Normal range of motion. He exhibits no edema, tenderness or deformity.  Lymphadenopathy:    He has no cervical adenopathy.  Neurological: He is alert and oriented to person, place, and time. He has normal strength. He displays no atrophy and no tremor. No cranial nerve deficit or sensory deficit. He exhibits abnormal muscle tone. He displays a negative Romberg sign. Gait abnormal. Coordination normal.  Mild weakness in the LLE  Skin: He is not diaphoretic.  Vitals reviewed.   Lab Results  Component Value Date   WBC 6.5 01/16/2018   HGB 13.0 01/16/2018   HCT 40.2 01/16/2018   PLT 320 01/16/2018   GLUCOSE 136 (H) 01/16/2018   CHOL 116 01/01/2018   TRIG 85 01/01/2018   HDL 24 (L) 01/01/2018   LDLDIRECT 104.0 10/12/2016   LDLCALC 75 01/01/2018   ALT 124 (H) 01/05/2018   AST 69 (H) 01/05/2018   NA 140 01/16/2018   K 4.4 01/16/2018   CL 100 01/16/2018   CREATININE 1.75 (H) 01/16/2018   BUN 33 (H) 01/16/2018   CO2 33 (H) 01/16/2018   TSH 3.13 09/01/2017   PSA 1.80 10/12/2016   INR 1.01 09/19/2009   HGBA1C 7.1 (A) 01/24/2018   MICROALBUR 31.2 (H) 09/01/2017    No results found.  Assessment & Plan:   Jay Chen was seen today for atrial fibrillation, hypertension and diabetes.  Diagnoses and all orders for this visit:  Need for influenza vaccination -     Flu Vaccine QUAD 6+ mos PF IM (Fluarix Quad PF)  Poorly controlled type 2 diabetes mellitus with peripheral neuropathy (HCC)-Metformin was discontinued during the admission due to renal insufficiency.  His A1c is at 7.1%.  His blood sugars are adequately well controlled.  Medical therapy is not indicated at this time. -     POCT HgB A1C -      POCT Glucose (CBG) -     Blood Glucose Monitoring Suppl (ACCU-CHEK GUIDE) w/Device KIT; 1 Act by Does not apply route 3 (three) times daily. -     glucose blood (ACCU-CHEK GUIDE) test strip; 1 each by Other route 3 (three) times daily. Use as instructed -     HM Diabetes Foot Exam  Atrial fibrillation with rapid ventricular response ([place])- He has not achieved rate or rhythm control.  He tells me he sees cardiology one day after this visit for evaluation and treatment of this.  Essential hypertension- His blood pressure is adequately well controlled.  Left temporal lobe infarction [facility])- Improvement noted.  Will continue risk factor modifications with blood pressure and blood sugar control, anticoagulation, and statin therapy.   I have discontinued  Jay Chen's polyethylene glycol, senna-docusate, and Oxycodone HCl. I am also having him start on ACCU-CHEK GUIDE and glucose blood. Additionally, I am having him maintain his acetaminophen, allopurinol, amiodarone, atorvastatin, furosemide, magnesium oxide, methocarbamol, metoprolol succinate, omega-3 acid ethyl esters, potassium chloride SA, and rivaroxaban.  Meds ordered this encounter  Medications  . Blood Glucose Monitoring Suppl (ACCU-CHEK GUIDE) w/Device KIT    Sig: 1 Act by Does not apply route 3 (three) times daily.    Dispense:  2 kit    Refill:  0  . glucose blood (ACCU-CHEK GUIDE) test strip    Sig: 1 each by Other route 3 (three) times daily. Use as instructed    Dispense:  300 each    Refill:  1     Follow-up: Return in about 4 months (around 05/26/2018).  Scarlette Calico, MD

## 2018-01-24 NOTE — Patient Instructions (Signed)

## 2018-01-25 ENCOUNTER — Ambulatory Visit: Payer: BC Managed Care – PPO | Admitting: Adult Health

## 2018-01-25 ENCOUNTER — Telehealth: Payer: Self-pay | Admitting: *Deleted

## 2018-01-25 ENCOUNTER — Encounter: Payer: Self-pay | Admitting: Adult Health

## 2018-01-25 VITALS — BP 138/100 | HR 116 | Ht 68.0 in | Wt 355.0 lb

## 2018-01-25 DIAGNOSIS — I4819 Other persistent atrial fibrillation: Secondary | ICD-10-CM

## 2018-01-25 DIAGNOSIS — E7800 Pure hypercholesterolemia, unspecified: Secondary | ICD-10-CM

## 2018-01-25 DIAGNOSIS — Z79899 Other long term (current) drug therapy: Secondary | ICD-10-CM | POA: Diagnosis not present

## 2018-01-25 DIAGNOSIS — G4733 Obstructive sleep apnea (adult) (pediatric): Secondary | ICD-10-CM | POA: Diagnosis not present

## 2018-01-25 DIAGNOSIS — I481 Persistent atrial fibrillation: Secondary | ICD-10-CM | POA: Diagnosis not present

## 2018-01-25 DIAGNOSIS — I5022 Chronic systolic (congestive) heart failure: Secondary | ICD-10-CM | POA: Diagnosis not present

## 2018-01-25 MED ORDER — METOPROLOL SUCCINATE ER 100 MG PO TB24: ORAL_TABLET | ORAL | 3 refills | Status: DC

## 2018-01-25 NOTE — Patient Instructions (Signed)
Medication Instructions:   INCREASE metoprolol succinate (Toproll XL) to 150mg  daily  Labwork:  BMET today  Testing/Procedures:  Your physician has recommended that you have a sleep study. This test records several body functions during sleep, including: brain activity, eye movement, oxygen and carbon dioxide blood levels, heart rate and rhythm, breathing rate and rhythm, the flow of air through your mouth and nose, snoring, body muscle movements, and chest and belly movement. -- this test will be pre-certified with your insurance company and then you will be called to schedule at either [facility] or have a home test -- Barry Brunner, [place] is our sleep coordinator  Follow-Up:  Your physician recommends that you schedule a follow-up appointment in 2-3 weeks with Jory Sims DNP  You have been referred to [facility]  If you need a refill on your cardiac medications before your next appointment, please call your pharmacy.  Any Other Special Instructions Will Be Listed Below (If Applicable).

## 2018-01-25 NOTE — Telephone Encounter (Signed)
Left sleep study appointment information on cell VM.

## 2018-01-26 ENCOUNTER — Other Ambulatory Visit: Payer: Self-pay | Admitting: *Deleted

## 2018-01-26 DIAGNOSIS — Z79899 Other long term (current) drug therapy: Secondary | ICD-10-CM

## 2018-01-26 LAB — BASIC METABOLIC PANEL
BUN/Creatinine Ratio: 16 (ref 9–20)
BUN: 32 mg/dL — ABNORMAL HIGH (ref 6–24)
CO2: 25 mmol/L (ref 20–29)
Calcium: 8.9 mg/dL (ref 8.7–10.2)
Chloride: 102 mmol/L (ref 96–106)
Creatinine, Ser: 2.03 mg/dL — ABNORMAL HIGH (ref 0.76–1.27)
GFR, EST AFRICAN AMERICAN: 42 mL/min/{1.73_m2} — AB (ref >59)
GFR, EST NON AFRICAN AMERICAN: 36 mL/min/{1.73_m2} — AB (ref >59)
Glucose: 106 mg/dL — ABNORMAL HIGH (ref 65–99)
Potassium: 4.5 mmol/L (ref 3.5–5.2)
SODIUM: 143 mmol/L (ref 134–144)

## 2018-02-01 ENCOUNTER — Encounter: Payer: Self-pay | Admitting: Physical Medicine & Rehabilitation

## 2018-02-01 ENCOUNTER — Encounter
Payer: BC Managed Care – PPO | Attending: Physical Medicine & Rehabilitation | Admitting: Physical Medicine & Rehabilitation

## 2018-02-01 ENCOUNTER — Other Ambulatory Visit: Payer: Self-pay

## 2018-02-01 VITALS — BP 135/88 | HR 102 | Ht 68.0 in | Wt 361.0 lb

## 2018-02-01 DIAGNOSIS — I5042 Chronic combined systolic (congestive) and diastolic (congestive) heart failure: Secondary | ICD-10-CM | POA: Diagnosis not present

## 2018-02-01 DIAGNOSIS — Z6841 Body Mass Index (BMI) 40.0 and over, adult: Secondary | ICD-10-CM | POA: Diagnosis not present

## 2018-02-01 DIAGNOSIS — Z09 Encounter for follow-up examination after completed treatment for conditions other than malignant neoplasm: Secondary | ICD-10-CM | POA: Insufficient documentation

## 2018-02-01 DIAGNOSIS — I481 Persistent atrial fibrillation: Secondary | ICD-10-CM | POA: Insufficient documentation

## 2018-02-01 DIAGNOSIS — E1122 Type 2 diabetes mellitus with diabetic chronic kidney disease: Secondary | ICD-10-CM | POA: Insufficient documentation

## 2018-02-01 DIAGNOSIS — I482 Chronic atrial fibrillation, unspecified: Secondary | ICD-10-CM

## 2018-02-01 DIAGNOSIS — M5416 Radiculopathy, lumbar region: Secondary | ICD-10-CM | POA: Diagnosis not present

## 2018-02-01 DIAGNOSIS — E785 Hyperlipidemia, unspecified: Secondary | ICD-10-CM | POA: Insufficient documentation

## 2018-02-01 DIAGNOSIS — R0602 Shortness of breath: Secondary | ICD-10-CM | POA: Insufficient documentation

## 2018-02-01 DIAGNOSIS — I712 Thoracic aortic aneurysm, without rupture: Secondary | ICD-10-CM | POA: Insufficient documentation

## 2018-02-01 DIAGNOSIS — N183 Chronic kidney disease, stage 3 (moderate): Secondary | ICD-10-CM | POA: Insufficient documentation

## 2018-02-01 DIAGNOSIS — I130 Hypertensive heart and chronic kidney disease with heart failure and stage 1 through stage 4 chronic kidney disease, or unspecified chronic kidney disease: Secondary | ICD-10-CM | POA: Diagnosis not present

## 2018-02-01 DIAGNOSIS — M109 Gout, unspecified: Secondary | ICD-10-CM | POA: Diagnosis not present

## 2018-02-01 DIAGNOSIS — M4726 Other spondylosis with radiculopathy, lumbar region: Secondary | ICD-10-CM | POA: Insufficient documentation

## 2018-02-01 DIAGNOSIS — G4730 Sleep apnea, unspecified: Secondary | ICD-10-CM | POA: Diagnosis not present

## 2018-02-01 DIAGNOSIS — M4802 Spinal stenosis, cervical region: Secondary | ICD-10-CM | POA: Diagnosis not present

## 2018-02-01 DIAGNOSIS — I6389 Other cerebral infarction: Secondary | ICD-10-CM | POA: Diagnosis not present

## 2018-02-01 DIAGNOSIS — Z8249 Family history of ischemic heart disease and other diseases of the circulatory system: Secondary | ICD-10-CM | POA: Diagnosis not present

## 2018-02-01 DIAGNOSIS — E1142 Type 2 diabetes mellitus with diabetic polyneuropathy: Secondary | ICD-10-CM | POA: Diagnosis not present

## 2018-02-01 NOTE — Patient Instructions (Signed)
PLEASE FEEL FREE TO CALL OUR OFFICE WITH ANY PROBLEMS OR QUESTIONS ([phone])      

## 2018-02-01 NOTE — Progress Notes (Signed)
Subjective:    Patient ID: Jay Chen, male    DOB: 10/19/1965, 52 y.o.   MRN: [id number]  HPI   Jay Chen is here in follow up of his inpatient rehab stay. Therapy has been coming out to the house. They have been working on balance and endurance. He is independent with his self-care. He is doing the stairs independently. He still is a little short of breath with prolonged ambulation. Weights are stable, perhaps elevated "a couple pounds".. Sugars are still a problem. He has seen cardiology and primary as well about his HTN/HR which they are trying to better control.  His metoprolol was adjusted as well as his Lasix apparently.  I asked him about his back and neck.  He has mild pain in both areas.  He does some gentle stretching and occasionally will use some heat but for the most part they have not been a problem.  He does do some basic walking at home to stay active.  Pain Inventory Average Pain 1 Pain Right Now 0 My pain is dull  In the last 24 hours, has pain interfered with the following? General activity 0 Relation with others 0 Enjoyment of life 0 What TIME of day is your pain at its worst? morning Sleep (in general) Fair  Pain is worse with: walking Pain improves with: rest Relief from Meds: 5  Mobility use a walker how many minutes can you walk? 10 ability to climb steps?  yes do you drive?  no  Function not employed: date last employed n/a  Neuro/Psych trouble walking  Prior Studies Any changes since last visit?  no x-rays CT/MRI  Physicians involved in your care Any changes since last visit?  no   Family History  Problem Relation Age of Onset  . Hypertension Mother   . Pancreatic cancer Father   . Prostate cancer Father   . Colon cancer Father    Social History   Socioeconomic History  . Marital status: Married    Spouse name: Not on file  . Number of children: Not on file  . Years of education: Not on file  . Highest education level:  Not on file  Occupational History  . Not on file  Social Needs  . Financial resource strain: Not on file  . Food insecurity:    Worry: Not on file    Inability: Not on file  . Transportation needs:    Medical: Not on file    Non-medical: Not on file  Tobacco Use  . Smoking status: Never Smoker  . Smokeless tobacco: Never Used  Substance and Sexual Activity  . Alcohol use: Not Currently    Comment: 12/28/2017 "nothing in the last couple years"  . Drug use: Never  . Sexual activity: Yes  Lifestyle  . Physical activity:    Days per week: Not on file    Minutes per session: Not on file  . Stress: Not on file  Relationships  . Social connections:    Talks on phone: Not on file    Gets together: Not on file    Attends religious service: Not on file    Active member of club or organization: Not on file    Attends meetings of clubs or organizations: Not on file    Relationship status: Not on file  Other Topics Concern  . Not on file  Social History Narrative  . Not on file   Past Surgical History:  Procedure Laterality Date  .  LAPAROSCOPIC CHOLECYSTECTOMY  2000  . US ECHOCARDIOGRAPHY  09/21/2009   EF 45-50%; Cavity size was severely dilated, severe concentric hypertrophy and normal wall motion   Past Medical History:  Diagnosis Date  . CKD (chronic kidney disease), stage III ([place])   . Congestive heart failure [facility]) May of 2011   Felt to have cor pulmonale; EF 45 to 50% from echo in May 2011  . Cor pulmonale (chronic) ([place])   . Gout    "take daily RX" (12/28/2017)  . Hyperlipidemia   . Hypertension   . Morbid obesity ([place])   . Persistent atrial fibrillation ([place])    [name] 12/28/2017  . Sleep apnea    "dx'd; couldn't tolerate CPAP" (12/28/2017)  . Thoracic aneurysm    a. 4.8cm thoracic aortic aneurysm by CT 2013.  [name] Thoracic aortic aneurysm ([place])    known/notes 12/28/2017  . Type II diabetes mellitus ([facility])    BP 135/88   Pulse (!) 102   Ht 5\' 8"  (1.727 m)   Wt (!) 361  lb (163.7 kg)   SpO2 92%   BMI 54.89 kg/m   Opioid Risk Score:   Fall Risk Score:  `1  Depression screen PHQ 2/9  Depression screen [facility] 2/9 02/01/2018 01/25/2018 10/12/2016  Decreased Interest 0 0 0  Down, Depressed, Hopeless 0 1 1  PHQ - 2 Score 0 1 1  Altered sleeping - - 1  Tired, decreased energy - - 1  Change in appetite - - 1  Feeling bad or failure about yourself  - - 1  Trouble concentrating - - 0  Moving slowly or fidgety/restless - - 0  Suicidal thoughts - - 0  PHQ-9 Score - - 5    Review of Systems  Constitutional: Negative.   HENT: Negative.   Eyes: Negative.   Respiratory: Negative.   Cardiovascular: Negative.   Gastrointestinal: Negative.   Endocrine: Negative.   Genitourinary: Negative.   Musculoskeletal: Negative.   Skin: Negative.   Allergic/Immunologic: Negative.   Neurological: Negative.   Hematological: Negative.   Psychiatric/Behavioral: Negative.   All other systems reviewed and are negative.      Objective:   Physical Exam  General: Alert and oriented x 3, No apparent distress.  Morbidly obese HEENT: Head is normocephalic, atraumatic, PERRLA, EOMI, sclera anicteric, oral mucosa pink and moist, dentition intact, ext ear canals clear,  Neck: Supple without JVD or lymphadenopathy Heart: Irregularly irregular.  Heart rate in the 80s to low 90s. Chest: CTA bilaterally without wheezes, rales, or rhonchi; no distress Abdomen: Soft, non-tender, non-distended, bowel sounds positive. Extremities: No clubbing, cyanosis, 1++ lower extremity edema. Pulses are 2+ Skin: Clean and intact without signs of breakdown Neuro: Pt is cognitively appropriate with normal insight, memory, and awareness. Cranial nerves 2-12 are intact. Sensory exam is normal. Reflexes are 2+ in all 4's. Fine motor coordination is intact. No tremors. Motor function is grossly 5/5.  Ambulates functionally with his rolling walker with good weight shift and change in direction.  Transferred  easily. Musculoskeletal: Full ROM, No pain with AROM or PROM in the neck, trunk, or extremities. Posture appropriate Psych: Pt's affect is appropriate. Pt is cooperative        Assessment & Plan:  1.Decreased functional mobilitysecondary to cervical stenosis with cord compression, lumbar spondylosis with radiculopathy, left temporal lobe infarction.  -Patient may see neurosurgery in follow-up.  I will schedule him back to see me in 2 months to discuss issues as they pertain to his back.  He really has minimal pain at this point and his back and neck or back-burner situations.              3. Fluids/Electrolytes/Nutrition:monitor ins and outs  4. Atrial fibrillation.  HR actually in 80-90's today  - 9.Chronic systolic and diastolic congestive heart failure. lasix and volume per cardiology 10 Diabetes mellitus with peripheral neuropathy.             -Needs better control.  We were keeping this tight in the hospital.  Needs to make appropriate choices with diet etc. 12.CKD stage III.               -Per primary. 13. Hx of Gout left great toe:  diet and fluid intake have been discussed  15 minutes of direct patient time was spent in the office today.  I will see him back in 2 months as noted above.

## 2018-02-10 ENCOUNTER — Telehealth: Payer: Self-pay | Admitting: Cardiovascular Disease

## 2018-02-10 NOTE — Telephone Encounter (Signed)
Received call from [place] with [facility].   States patient has been doing very well but when he was there yesterday his blood pressure increased and he became SOB quicker than usual.      BP at rest: 140/80 Exertion: 180/104 After 5 mins: 160/92 Doesn't know HR, but states he does not get tachycardic.  He states he seemed to have a small amount of edema in feet but not a significant increase.   Does not know weights.     Called patient to speak with him, left message to call back to discuss.  Patient has appt scheduled 9/25

## 2018-02-10 NOTE — Telephone Encounter (Signed)
Returned call to patient, patient states he is feeling fine.  He denies SOB, denies weight gain, reports a very small amount of swelling.   Advised to continue to monitor weights, BP, salt intake and to call if he begins having increase in swelling/SOB or BP consistently increased.  He has appt 9/25 and advised to keep appt as planned.   Patient agreed and verbalized understanding.

## 2018-02-10 NOTE — Telephone Encounter (Signed)
Follow Up:    Patient is returning a call

## 2018-02-13 NOTE — Telephone Encounter (Signed)
Duplicate phone message-see telephone note 9/20

## 2018-02-14 NOTE — Progress Notes (Signed)
H&P Note   Date:  02/15/2018   ID:  Jay Chen, DOB 09/30/1965, MRN [id number]  PCP:  Jay Lima, MD  Cardiologist:  Dr. Gwenlyn Chen  No chief complaint on file.    History of Present Illness: Jay Chen is a 52 y.o. male who presents for ongoing assessment and management of atrial fibrillation, chronic mixed CHF, (EF of 20%-25%), AAA 4.6 cm per echo, hx of CVA, hypertension, hyperlipidemia, OSA, Type II diabetes, and morbid obesity.   He was last seen in the office on 01/25/2018 and was noted to have uncontrolled atrial fib HR. Metoprolol was increased to 150 mg daily from 100 mg daily. He was non-compliant with CPAP making it difficult to control atrial fib. A repeat sleep study was ordered. He was not Chen to be a candidate for Entresto at that time due to renal insufficiency, a BMET was ordered.   Labs reviewed, creatinine 2.03, K+ 4.5.  He comes today with worsening dyspnea, significant fluid overload with 25 lb weight gain of approximately 30  lbs.He has swollen LE with 4+ pitting edema to his thighs with weeping and skin blisters. He admits to eating salted foods. Breathing status in declined but he doesn't seem to notice this although is is dyspneic here in the office from walking to exam room.  He had recently been reduced to daily lasix due to worsening creatinine. This may also have contributed to volume overload.     Past Medical History:  Diagnosis Date  . CKD (chronic kidney disease), stage III (Jay Chen)   . Congestive heart failure [facility]) May of 2011   Felt to have cor pulmonale; EF 45 to 50% from echo in May 2011  . Cor pulmonale (chronic) (Jay Chen)   . Gout    "take daily RX" (12/28/2017)  . Hyperlipidemia   . Hypertension   . Morbid obesity (Jay Chen)   . Persistent atrial fibrillation (Jay Chen)    Jay Chen 12/28/2017  . Sleep apnea    "dx'd; couldn't tolerate CPAP" (12/28/2017)  . Thoracic aneurysm    a. 4.8cm thoracic aortic aneurysm by CT 2013.  [name] Thoracic aortic aneurysm  ([place])    known/notes 12/28/2017  . Type II diabetes mellitus (Jay Chen)     Past Surgical History:  Procedure Laterality Date  . LAPAROSCOPIC CHOLECYSTECTOMY  2000  . US ECHOCARDIOGRAPHY  09/21/2009   EF 45-50%; Cavity size was severely dilated, severe concentric hypertrophy and normal wall motion     Current Outpatient Medications  Medication Sig Dispense Refill  . acetaminophen (TYLENOL) 325 MG tablet Take 2 tablets (650 mg total) by mouth every 6 (six) hours as needed for mild pain or headache (or Fever >/= 101).    [name] allopurinol (ZYLOPRIM) 100 MG tablet Take 1 tablet (100 mg total) by mouth daily. 90 tablet 0  . amiodarone (PACERONE) 200 MG tablet Take 1 tablet (200 mg total) by mouth daily. 30 tablet 1  . atorvastatin (LIPITOR) 80 MG tablet Take 1 tablet (80 mg total) by mouth daily. 30 tablet 1  . Blood Glucose Monitoring Suppl (ACCU-CHEK GUIDE) w/Device KIT 1 Act by Does not apply route 3 (three) times daily. 2 kit 0  . furosemide (LASIX) 40 MG tablet Take 1 tablet (40 mg total) by mouth daily. (Patient taking differently: Take 40 mg by mouth daily. Alternating 1/2 tablet one day and then one whole tablet the next day) 90 tablet 1  . glucose blood (ACCU-CHEK GUIDE) test strip 1 each by Other  route 3 (three) times daily. Use as instructed 300 each 1  . magnesium oxide (MAG-OX) 400 MG tablet Take 1 tablet (400 mg total) by mouth daily. 90 tablet 1  . methocarbamol (ROBAXIN) 500 MG tablet Take 1 tablet (500 mg total) by mouth 3 (three) times daily. 90 tablet 0  . metoprolol succinate (TOPROL-XL) 100 MG 24 hr tablet Take one and one-half tablets (161m) by mouth daily. Take with or immediately following a meal. 135 tablet 3  . omega-3 acid ethyl esters (LOVAZA) 1 g capsule Take 2 capsules (2 g total) by mouth 2 (two) times daily. 120 capsule 11  . potassium chloride SA (KLOR-CON M20) 20 MEQ tablet Take 2 tablets (40 mEq total) by mouth daily. 180 tablet 1  . rivaroxaban (XARELTO) 20 MG TABS  tablet Take 1 tablet (20 mg total) by mouth daily with supper. 90 tablet 1   No current facility-administered medications for this visit.     Allergies:   Patient has no known allergies.    Social History:  The patient  reports that he has never smoked. He has never used smokeless tobacco. He reports that he drank alcohol. He reports that he does not use drugs.   Family History:  The patient's family history includes Colon cancer in his father; Hypertension in his mother; Pancreatic cancer in his father; Prostate cancer in his father.    ROS: All other systems are reviewed and negative. Unless otherwise mentioned in H&P    PHYSICAL EXAM: VS:  BP (!) 149/112   Pulse 94   Ht 5' 8"  (1.727 m)   Wt (!) 382 lb 9.6 oz (173.5 kg)   BMI 58.17 kg/m  , BMI Body mass index is 58.17 kg/m. GEN: Well nourished, well developed, in no acute distress  HEENT: normal Right eye dropping.  Neck: no JVD, carotid bruits, or masses Cardiac:IRRR, tachycardic; no murmurs, rubs, or gallops,3+-4+ edema into the thighs, with weeping and blisters of the skin in the LEE bilaterally.   Respiratory:  clear to auscultation bilaterally, increased work of breathing with exertion, diminished in the bases.  GI: soft, nontender, nondistended, + BS MS: no deformity or atrophy  Skin: warm and dry, no rash,blisters in the LEE bilaterally.  Neuro:  Strength and sensation are intact Psych: euthymic mood, full affect   EKG:  Not completed at this office clinic   Recent Labs: 09/01/2017: TSH 3.13 12/27/2017: B Natriuretic Peptide 283.3 01/05/2018: ALT 124 01/16/2018: Hemoglobin 13.0; Platelets 320 01/25/2018: BUN 32; Creatinine, Ser 2.03; Potassium 4.5; Sodium 143    Lipid Panel    Component Value Date/Time   CHOL 116 009/05/190159   TRIG 85 009-05-20190159   HDL 24 (L) 0Sep 05, 20190159   CHOLHDL 4.8 009-05-190159   VLDL 17 009/05/190159   LDLCALC 75 02019/09/050159   LDLDIRECT 104.0 10/12/2016 1118       Wt Readings from Last 3 Encounters:  02/15/18 (!) 382 lb 9.6 oz (173.5 kg)  02/01/18 (!) 361 lb (163.7 kg)  01/25/18 (!) 355 lb (161 kg)      Other studies Reviewed: Echocardiogram 805-Sep-2019Left ventricle: The cavity size was moderately to severely   dilated. Wall thickness was increased in a pattern of moderate   LVH. Systolic function was severely reduced. The estimated   ejection fraction was in the range of 20% to 25%. Diffuse   hypokinesis. The study is not technically sufficient to allow   evaluation of LV diastolic function. - Aortic  valve: Mildly calcified annulus. Valve area (VTI): 4.07   cm^2. Valve area (Vmax): 4.14 cm^2. Valve area (Vmean): 4.16   cm^2. - Aorta: There is a single still frame view of a portion of the   proximal ascending that is dilated measuring 4.7 cm, consistent   with moderate to large aneurysm. From chart review a similar   aneurysm was detected by CT scan 03/28/2012. Limited evaluation by   echo, consider alternative modality if more complete evaluation   is indicated. - Left atrium: The atrium was severely dilated. - Right ventricle: The cavity size was mildly dilated. Systolic   function was mildly reduced. - Right atrium: The atrium was mildly to moderately dilated. - Atrial septum: No defect or patent foramen ovale was identified. - Inferior vena cava: The vessel was dilated. The respirophasic   diameter changes were blunted (< 50%), consistent with elevated   central venous pressure. - Technically difficult study. Echocontrast was used to enhance   visualization.  ASSESSMENT AND PLAN:  1. Acute on Chronic Decompensated CHF, in the setting of dilated cardiomyopathy: Massive fluid overload of approximately 30 lbs over 2 weeks time.  Due to this , best served with admission for IV diuretics. I have discussed this with Dr. Claiborne Billings, who is DOD today. He has seen the patient as well and agrees with my plan.   Will admit for IV lasix 60 mg TID,  hydralazine and nitrates. Daily weights and labs, along with strict I/O. He will be seen by the Advanced Heart Failure team during admission. This has been explained to him and he is willing to be admitted.   2. CKD Stage III: He is not a candidate for ACE/ARB or Entresto.   3. Hypertension: Elevated due to volume overload. He will be treated with nitrates and hydralazine for better control.   4.Atrial fib: Heart rate is not well controlled currently He will remain on Xarelto for anticoagulation. On metoprolol for HR control which may need adjustment during admission.    Current medicines are reviewed at length with the patient today.    Labs/ tests ordered today include: None-ADMIT.   Phill Myron. West Pugh, ANP, AACC   02/15/2018 11:05 AM    [facility] [address], [place], [place] [address] Phone: [phone]; Fax: [phone]

## 2018-02-15 ENCOUNTER — Inpatient Hospital Stay (HOSPITAL_COMMUNITY)
Admission: AD | Admit: 2018-02-15 | Discharge: 2018-02-23 | DRG: 286 | Disposition: A | Discharge status: Rehabilitation Facility | Payer: BC Managed Care – PPO | Source: Ambulatory Visit | Attending: Internal Medicine | Admitting: Internal Medicine

## 2018-02-15 ENCOUNTER — Encounter: Payer: Self-pay | Admitting: Adult Health

## 2018-02-15 ENCOUNTER — Ambulatory Visit: Payer: BC Managed Care – PPO | Admitting: Adult Health

## 2018-02-15 ENCOUNTER — Other Ambulatory Visit: Payer: Self-pay

## 2018-02-15 ENCOUNTER — Encounter (HOSPITAL_COMMUNITY): Payer: Self-pay | Admitting: General Practice

## 2018-02-15 VITALS — BP 149/112 | HR 94 | Ht 68.0 in | Wt 382.6 lb

## 2018-02-15 DIAGNOSIS — R2689 Other abnormalities of gait and mobility: Secondary | ICD-10-CM | POA: Diagnosis not present

## 2018-02-15 DIAGNOSIS — E1142 Type 2 diabetes mellitus with diabetic polyneuropathy: Secondary | ICD-10-CM | POA: Diagnosis not present

## 2018-02-15 DIAGNOSIS — M4712 Other spondylosis with myelopathy, cervical region: Secondary | ICD-10-CM | POA: Diagnosis not present

## 2018-02-15 DIAGNOSIS — G4733 Obstructive sleep apnea (adult) (pediatric): Secondary | ICD-10-CM | POA: Diagnosis present

## 2018-02-15 DIAGNOSIS — N184 Chronic kidney disease, stage 4 (severe): Secondary | ICD-10-CM | POA: Diagnosis not present

## 2018-02-15 DIAGNOSIS — N179 Acute kidney failure, unspecified: Secondary | ICD-10-CM | POA: Diagnosis present

## 2018-02-15 DIAGNOSIS — Z800 Family history of malignant neoplasm of digestive organs: Secondary | ICD-10-CM | POA: Diagnosis not present

## 2018-02-15 DIAGNOSIS — E669 Obesity, unspecified: Secondary | ICD-10-CM | POA: Diagnosis not present

## 2018-02-15 DIAGNOSIS — I4892 Unspecified atrial flutter: Secondary | ICD-10-CM | POA: Diagnosis not present

## 2018-02-15 DIAGNOSIS — N183 Chronic kidney disease, stage 3 (moderate): Secondary | ICD-10-CM | POA: Diagnosis present

## 2018-02-15 DIAGNOSIS — E46 Unspecified protein-calorie malnutrition: Secondary | ICD-10-CM | POA: Diagnosis not present

## 2018-02-15 DIAGNOSIS — R5381 Other malaise: Secondary | ICD-10-CM | POA: Diagnosis present

## 2018-02-15 DIAGNOSIS — M109 Gout, unspecified: Secondary | ICD-10-CM | POA: Diagnosis present

## 2018-02-15 DIAGNOSIS — E876 Hypokalemia: Secondary | ICD-10-CM | POA: Diagnosis not present

## 2018-02-15 DIAGNOSIS — Z8673 Personal history of transient ischemic attack (TIA), and cerebral infarction without residual deficits: Secondary | ICD-10-CM

## 2018-02-15 DIAGNOSIS — I481 Persistent atrial fibrillation: Secondary | ICD-10-CM | POA: Diagnosis not present

## 2018-02-15 DIAGNOSIS — Z9119 Patient's noncompliance with other medical treatment and regimen: Secondary | ICD-10-CM | POA: Diagnosis not present

## 2018-02-15 DIAGNOSIS — R600 Localized edema: Secondary | ICD-10-CM

## 2018-02-15 DIAGNOSIS — I428 Other cardiomyopathies: Secondary | ICD-10-CM | POA: Diagnosis present

## 2018-02-15 DIAGNOSIS — R030 Elevated blood-pressure reading, without diagnosis of hypertension: Secondary | ICD-10-CM | POA: Diagnosis not present

## 2018-02-15 DIAGNOSIS — Z8042 Family history of malignant neoplasm of prostate: Secondary | ICD-10-CM | POA: Diagnosis not present

## 2018-02-15 DIAGNOSIS — Z8249 Family history of ischemic heart disease and other diseases of the circulatory system: Secondary | ICD-10-CM

## 2018-02-15 DIAGNOSIS — E1169 Type 2 diabetes mellitus with other specified complication: Secondary | ICD-10-CM | POA: Diagnosis not present

## 2018-02-15 DIAGNOSIS — I10 Essential (primary) hypertension: Secondary | ICD-10-CM | POA: Diagnosis not present

## 2018-02-15 DIAGNOSIS — I5043 Acute on chronic combined systolic (congestive) and diastolic (congestive) heart failure: Secondary | ICD-10-CM | POA: Diagnosis present

## 2018-02-15 DIAGNOSIS — I480 Paroxysmal atrial fibrillation: Secondary | ICD-10-CM | POA: Diagnosis not present

## 2018-02-15 DIAGNOSIS — E785 Hyperlipidemia, unspecified: Secondary | ICD-10-CM | POA: Diagnosis present

## 2018-02-15 DIAGNOSIS — I4819 Other persistent atrial fibrillation: Secondary | ICD-10-CM | POA: Diagnosis present

## 2018-02-15 DIAGNOSIS — I5020 Unspecified systolic (congestive) heart failure: Secondary | ICD-10-CM

## 2018-02-15 DIAGNOSIS — E119 Type 2 diabetes mellitus without complications: Secondary | ICD-10-CM | POA: Diagnosis not present

## 2018-02-15 DIAGNOSIS — I714 Abdominal aortic aneurysm, without rupture: Secondary | ICD-10-CM | POA: Diagnosis present

## 2018-02-15 DIAGNOSIS — G992 Myelopathy in diseases classified elsewhere: Secondary | ICD-10-CM | POA: Diagnosis not present

## 2018-02-15 DIAGNOSIS — Z6841 Body Mass Index (BMI) 40.0 and over, adult: Secondary | ICD-10-CM | POA: Diagnosis not present

## 2018-02-15 DIAGNOSIS — I5023 Acute on chronic systolic (congestive) heart failure: Secondary | ICD-10-CM

## 2018-02-15 DIAGNOSIS — E1165 Type 2 diabetes mellitus with hyperglycemia: Secondary | ICD-10-CM | POA: Diagnosis not present

## 2018-02-15 DIAGNOSIS — I5042 Chronic combined systolic (congestive) and diastolic (congestive) heart failure: Secondary | ICD-10-CM | POA: Diagnosis not present

## 2018-02-15 DIAGNOSIS — I482 Chronic atrial fibrillation: Secondary | ICD-10-CM | POA: Diagnosis not present

## 2018-02-15 DIAGNOSIS — I130 Hypertensive heart and chronic kidney disease with heart failure and stage 1 through stage 4 chronic kidney disease, or unspecified chronic kidney disease: Principal | ICD-10-CM | POA: Diagnosis present

## 2018-02-15 DIAGNOSIS — I2781 Cor pulmonale (chronic): Secondary | ICD-10-CM | POA: Diagnosis present

## 2018-02-15 DIAGNOSIS — R269 Unspecified abnormalities of gait and mobility: Secondary | ICD-10-CM | POA: Diagnosis present

## 2018-02-15 DIAGNOSIS — Z9111 Patient's noncompliance with dietary regimen: Secondary | ICD-10-CM

## 2018-02-15 DIAGNOSIS — Z7901 Long term (current) use of anticoagulants: Secondary | ICD-10-CM

## 2018-02-15 DIAGNOSIS — E1122 Type 2 diabetes mellitus with diabetic chronic kidney disease: Secondary | ICD-10-CM | POA: Diagnosis present

## 2018-02-15 DIAGNOSIS — I509 Heart failure, unspecified: Secondary | ICD-10-CM

## 2018-02-15 DIAGNOSIS — M4802 Spinal stenosis, cervical region: Secondary | ICD-10-CM | POA: Diagnosis not present

## 2018-02-15 DIAGNOSIS — I43 Cardiomyopathy in diseases classified elsewhere: Secondary | ICD-10-CM

## 2018-02-15 DIAGNOSIS — D62 Acute posthemorrhagic anemia: Secondary | ICD-10-CM | POA: Diagnosis not present

## 2018-02-15 DIAGNOSIS — I4891 Unspecified atrial fibrillation: Secondary | ICD-10-CM | POA: Diagnosis not present

## 2018-02-15 HISTORY — DX: Personal history of urinary calculi: Z87.442

## 2018-02-15 LAB — COMPREHENSIVE METABOLIC PANEL
ALBUMIN: 2.9 g/dL — AB (ref 3.5–5.0)
ALK PHOS: 107 U/L (ref 38–126)
ALT: 80 U/L — AB (ref 0–44)
ANION GAP: 9 (ref 5–15)
AST: 44 U/L — ABNORMAL HIGH (ref 15–41)
BILIRUBIN TOTAL: 1.2 mg/dL (ref 0.3–1.2)
BUN: 32 mg/dL — ABNORMAL HIGH (ref 6–20)
CALCIUM: 8.8 mg/dL — AB (ref 8.9–10.3)
CO2: 30 mmol/L (ref 22–32)
CREATININE: 2.06 mg/dL — AB (ref 0.61–1.24)
Chloride: 104 mmol/L (ref 98–111)
GFR calc Af Amer: 40 mL/min — ABNORMAL LOW (ref >60)
GFR calc non Af Amer: 35 mL/min — ABNORMAL LOW (ref >60)
GLUCOSE: 114 mg/dL — AB (ref 70–99)
Potassium: 3.8 mmol/L (ref 3.5–5.1)
SODIUM: 143 mmol/L (ref 135–145)
TOTAL PROTEIN: 6.3 g/dL — AB (ref 6.5–8.1)

## 2018-02-15 LAB — CBC WITH DIFFERENTIAL/PLATELET
Abs Immature Granulocytes: 0 K/uL (ref 0.0–0.1)
Basophils Absolute: 0.1 K/uL (ref 0.0–0.1)
Basophils Relative: 1 %
Eosinophils Absolute: 0.1 K/uL (ref 0.0–0.7)
Eosinophils Relative: 2 %
HCT: 41.8 % (ref 39.0–52.0)
Hemoglobin: 13.2 g/dL (ref 13.0–17.0)
Immature Granulocytes: 0 %
Lymphocytes Relative: 21 %
Lymphs Abs: 1.2 K/uL (ref 0.7–4.0)
MCH: 31.8 pg (ref 26.0–34.0)
MCHC: 31.6 g/dL (ref 30.0–36.0)
MCV: 100.7 fL — ABNORMAL HIGH (ref 78.0–100.0)
Monocytes Absolute: 0.6 K/uL (ref 0.1–1.0)
Monocytes Relative: 9 %
Neutro Abs: 4 K/uL (ref 1.7–7.7)
Neutrophils Relative %: 67 %
Platelets: 228 K/uL (ref 150–400)
RBC: 4.15 MIL/uL — ABNORMAL LOW (ref 4.22–5.81)
RDW: 16.8 % — ABNORMAL HIGH (ref 11.5–15.5)
WBC: 5.9 K/uL (ref 4.0–10.5)

## 2018-02-15 LAB — GLUCOSE, CAPILLARY
Glucose-Capillary: 109 mg/dL — ABNORMAL HIGH (ref 70–99)
Glucose-Capillary: 128 mg/dL — ABNORMAL HIGH (ref 70–99)

## 2018-02-15 LAB — MAGNESIUM: Magnesium: 1.6 mg/dL — ABNORMAL LOW (ref 1.7–2.4)

## 2018-02-15 LAB — BRAIN NATRIURETIC PEPTIDE: B Natriuretic Peptide: 847.1 pg/mL — ABNORMAL HIGH (ref 0.0–100.0)

## 2018-02-15 MED ORDER — OMEGA-3-ACID ETHYL ESTERS 1 G PO CAPS
1.0000 g | ORAL_CAPSULE | Freq: Two times a day (BID) | ORAL | Status: DC
  Administered 2018-02-15 – 2018-02-23 (×16): 1 g via ORAL
  Filled 2018-02-15 (×16): qty 1

## 2018-02-15 MED ORDER — AMIODARONE HCL 200 MG PO TABS
200.0000 mg | ORAL_TABLET | Freq: Every day | ORAL | Status: DC
  Administered 2018-02-16 – 2018-02-20 (×5): 200 mg via ORAL
  Filled 2018-02-15 (×5): qty 1

## 2018-02-15 MED ORDER — RIVAROXABAN 20 MG PO TABS
20.0000 mg | ORAL_TABLET | Freq: Every day | ORAL | Status: DC
  Administered 2018-02-15 – 2018-02-16 (×2): 20 mg via ORAL
  Filled 2018-02-15 (×2): qty 1

## 2018-02-15 MED ORDER — ACETAMINOPHEN 325 MG PO TABS
650.0000 mg | ORAL_TABLET | ORAL | Status: DC | PRN
  Administered 2018-02-17 – 2018-02-22 (×6): 650 mg via ORAL
  Filled 2018-02-15 (×6): qty 2

## 2018-02-15 MED ORDER — ALLOPURINOL 100 MG PO TABS
100.0000 mg | ORAL_TABLET | Freq: Every day | ORAL | Status: DC
  Administered 2018-02-16 – 2018-02-23 (×8): 100 mg via ORAL
  Filled 2018-02-15 (×8): qty 1

## 2018-02-15 MED ORDER — ONDANSETRON HCL 4 MG/2ML IJ SOLN: 4.0000 mg | Freq: Four times a day (QID) | INTRAMUSCULAR | Status: DC | PRN

## 2018-02-15 MED ORDER — ATORVASTATIN CALCIUM 80 MG PO TABS
80.0000 mg | ORAL_TABLET | Freq: Every day | ORAL | Status: DC
  Administered 2018-02-15 – 2018-02-23 (×8): 80 mg via ORAL
  Filled 2018-02-15 (×9): qty 1

## 2018-02-15 MED ORDER — METOPROLOL SUCCINATE ER 100 MG PO TB24
100.0000 mg | ORAL_TABLET | Freq: Every day | ORAL | Status: DC
  Administered 2018-02-15: 100 mg via ORAL
  Filled 2018-02-15 (×2): qty 1

## 2018-02-15 MED ORDER — SODIUM CHLORIDE 0.9% FLUSH
3.0000 mL | Freq: Two times a day (BID) | INTRAVENOUS | Status: DC
  Administered 2018-02-15 – 2018-02-23 (×11): 3 mL via INTRAVENOUS

## 2018-02-15 MED ORDER — HYDRALAZINE HCL 25 MG PO TABS
25.0000 mg | ORAL_TABLET | Freq: Three times a day (TID) | ORAL | Status: DC
  Administered 2018-02-15 – 2018-02-17 (×6): 25 mg via ORAL
  Filled 2018-02-15 (×6): qty 1

## 2018-02-15 MED ORDER — POTASSIUM CHLORIDE CRYS ER 20 MEQ PO TBCR
40.0000 meq | EXTENDED_RELEASE_TABLET | Freq: Every day | ORAL | Status: DC
  Administered 2018-02-16 – 2018-02-20 (×5): 40 meq via ORAL
  Filled 2018-02-15 (×5): qty 2

## 2018-02-15 MED ORDER — FUROSEMIDE 10 MG/ML IJ SOLN
60.0000 mg | Freq: Three times a day (TID) | INTRAMUSCULAR | Status: DC
  Administered 2018-02-15 – 2018-02-16 (×3): 60 mg via INTRAVENOUS
  Filled 2018-02-15 (×3): qty 6

## 2018-02-15 MED ORDER — ISOSORBIDE MONONITRATE ER 30 MG PO TB24
30.0000 mg | ORAL_TABLET | Freq: Every day | ORAL | Status: DC
  Administered 2018-02-15 – 2018-02-23 (×9): 30 mg via ORAL
  Filled 2018-02-15 (×9): qty 1

## 2018-02-15 MED ORDER — ASPIRIN EC 81 MG PO TBEC
81.0000 mg | DELAYED_RELEASE_TABLET | Freq: Every day | ORAL | Status: DC
  Administered 2018-02-15 – 2018-02-23 (×9): 81 mg via ORAL
  Filled 2018-02-15 (×10): qty 1

## 2018-02-15 MED ORDER — SODIUM CHLORIDE 0.9 % IV SOLN
250.0000 mL | INTRAVENOUS | Status: DC | PRN
  Administered 2018-02-16 – 2018-02-20 (×2): 250 mL via INTRAVENOUS

## 2018-02-15 MED ORDER — GUAIFENESIN-DM 100-10 MG/5ML PO SYRP
5.0000 mL | ORAL_SOLUTION | ORAL | Status: DC | PRN
  Administered 2018-02-15 – 2018-02-16 (×2): 5 mL via ORAL
  Filled 2018-02-15 (×2): qty 5

## 2018-02-15 MED ORDER — SODIUM CHLORIDE 0.9% FLUSH: 3.0000 mL | INTRAVENOUS | Status: DC | PRN

## 2018-02-15 NOTE — Progress Notes (Addendum)
CARDIOLOGY HISTORY AND PHYSICAL   Patient ID: Jay Chen MRN: [id number]  DOB/AGE: 54-Oct-1967 54 y.o. Admit date: (Not on file)  Date:  02/15/2018   ID:  Jay Chen, DOB 1965/07/25, MRN [id number]  PCP:  [name], MD  Cardiologist:  Dr. Gwenlyn Found   History of Present Illness: Jay Chen is a 52 y.o. male who presents for ongoing assessment and management of atrial fibrillation, chronic mixed CHF, (EF of 20%-25%), AAA 4.6 cm per echo, hx of CVA, hypertension, hyperlipidemia, OSA, Type II diabetes, and morbid obesity.   He was last seen in the office on 01/25/2018 and was noted to have uncontrolled atrial fib HR. Metoprolol was increased to 150 mg daily from 100 mg daily. He was non-compliant with CPAP making it difficult to control atrial fib. A repeat sleep study was ordered. He was not found to be a candidate for Entresto at that time due to renal insufficiency, a BMET was ordered.   Labs reviewed, creatinine 2.03, K+ 4.5.  He comes today with worsening dyspnea, significant fluid overload with 25 lb weight gain of approximately 30  lbs.He has swollen LE with 4+ pitting edema to his thighs with weeping and skin blisters. He admits to eating salted foods. Breathing status in declined but he doesn't seem to notice this although is is dyspneic here in the office from walking to exam room.  He had recently been reduced to daily lasix due to worsening creatinine. This may also have contributed to volume overload.     Past Medical History:  Diagnosis Date  . CKD (chronic kidney disease), stage III ([place])   . Congestive heart failure [facility]) May of 2011   Felt to have cor pulmonale; EF 45 to 50% from echo in May 2011  . Cor pulmonale (chronic) ([place])   . Gout    "take daily RX" (12/28/2017)  . Hyperlipidemia   . Hypertension   . Morbid obesity ([place])   . Persistent atrial fibrillation ([place])    [name] 12/28/2017  . Sleep apnea    "dx'd; couldn't tolerate CPAP" (12/28/2017)  .  Thoracic aneurysm    a. 4.8cm thoracic aortic aneurysm by CT 2013.  [name] Thoracic aortic aneurysm ([place])    known/notes 12/28/2017  . Type II diabetes mellitus ([place])     Past Surgical History:  Procedure Laterality Date  . LAPAROSCOPIC CHOLECYSTECTOMY  2000  . US ECHOCARDIOGRAPHY  09/21/2009   EF 45-50%; Cavity size was severely dilated, severe concentric hypertrophy and normal wall motion     Current Outpatient Medications  Medication Sig Dispense Refill  . acetaminophen (TYLENOL) 325 MG tablet Take 2 tablets (650 mg total) by mouth every 6 (six) hours as needed for mild pain or headache (or Fever >/= 101).    [name] allopurinol (ZYLOPRIM) 100 MG tablet Take 1 tablet (100 mg total) by mouth daily. 90 tablet 0  . amiodarone (PACERONE) 200 MG tablet Take 1 tablet (200 mg total) by mouth daily. 30 tablet 1  . atorvastatin (LIPITOR) 80 MG tablet Take 1 tablet (80 mg total) by mouth daily. 30 tablet 1  . Blood Glucose Monitoring Suppl (ACCU-CHEK GUIDE) w/Device KIT 1 Act by Does not apply route 3 (three) times daily. 2 kit 0  . furosemide (LASIX) 40 MG tablet Take 1 tablet (40 mg total) by mouth daily. (Patient taking differently: Take 40 mg by mouth daily. Alternating 1/2 tablet one day and then one whole tablet the next day) 90 tablet  1  . glucose blood (ACCU-CHEK GUIDE) test strip 1 each by Other route 3 (three) times daily. Use as instructed 300 each 1  . magnesium oxide (MAG-OX) 400 MG tablet Take 1 tablet (400 mg total) by mouth daily. 90 tablet 1  . methocarbamol (ROBAXIN) 500 MG tablet Take 1 tablet (500 mg total) by mouth 3 (three) times daily. 90 tablet 0  . metoprolol succinate (TOPROL-XL) 100 MG 24 hr tablet Take one and one-half tablets (137m) by mouth daily. Take with or immediately following a meal. 135 tablet 3  . omega-3 acid ethyl esters (LOVAZA) 1 g capsule Take 2 capsules (2 g total) by mouth 2 (two) times daily. 120 capsule 11  . potassium chloride SA (KLOR-CON M20) 20 MEQ tablet  Take 2 tablets (40 mEq total) by mouth daily. 180 tablet 1  . rivaroxaban (XARELTO) 20 MG TABS tablet Take 1 tablet (20 mg total) by mouth daily with supper. 90 tablet 1   No current facility-administered medications for this visit.     Allergies:   Patient has no known allergies.    Social History:  The patient  reports that he has never smoked. He has never used smokeless tobacco. He reports that he drank alcohol. He reports that he does not use drugs.   Family History:  The patient's family history includes Colon cancer in his father; Hypertension in his mother; Pancreatic cancer in his father; Prostate cancer in his father.    ROS: All other systems are reviewed and negative. Unless otherwise mentioned in H&P    PHYSICAL EXAM: VS:  BP (!) 149/112   Pulse 94   Ht 5' 8"  (1.727 m)   Wt (!) 382 lb 9.6 oz (173.5 kg)   BMI 58.17 kg/m  , BMI Body mass index is 58.17 kg/m. GEN: Well nourished, well developed, in no acute distress  HEENT: normal Right eye dropping.  Neck: no JVD, carotid bruits, or masses Cardiac:IRRR, tachycardic; no murmurs, rubs, or gallops,3+-4+ edema into the thighs, with weeping and blisters of the skin in the LEE bilaterally.   Respiratory:  clear to auscultation bilaterally, increased work of breathing with exertion, diminished in the bases.  GI: soft, nontender, nondistended, + BS MS: no deformity or atrophy  Skin: warm and dry, no rash,blisters in the LEE bilaterally.  Neuro:  Strength and sensation are intact Psych: euthymic mood, full affect   EKG:  Not completed at this office clinic   Recent Labs: 09/01/2017: TSH 3.13 12/27/2017: B Natriuretic Peptide 283.3 01/05/2018: ALT 124 01/16/2018: Hemoglobin 13.0; Platelets 320 01/25/2018: BUN 32; Creatinine, Ser 2.03; Potassium 4.5; Sodium 143    Lipid Panel    Component Value Date/Time   CHOL 116 0Sep 05, 20190159   TRIG 85 0September 05, 20190159   HDL 24 (L) 009/05/190159   CHOLHDL 4.8 009-05-190159    VLDL 17 009/05/190159   LDLCALC 75 009-05-20190159   LDLDIRECT 104.0 10/12/2016 1118      Wt Readings from Last 3 Encounters:  02/15/18 (!) 382 lb 9.6 oz (173.5 kg)  02/01/18 (!) 361 lb (163.7 kg)  01/25/18 (!) 355 lb (161 kg)      Other studies Reviewed: Echocardiogram 809/05/2019Left ventricle: The cavity size was moderately to severely   dilated. Wall thickness was increased in a pattern of moderate   LVH. Systolic function was severely reduced. The estimated   ejection fraction was in the range of 20% to 25%. Diffuse   hypokinesis. The study is not  technically sufficient to allow   evaluation of LV diastolic function. - Aortic valve: Mildly calcified annulus. Valve area (VTI): 4.07   cm^2. Valve area (Vmax): 4.14 cm^2. Valve area (Vmean): 4.16   cm^2. - Aorta: There is a single still frame view of a portion of the   proximal ascending that is dilated measuring 4.7 cm, consistent   with moderate to large aneurysm. From chart review a similar   aneurysm was detected by CT scan 03/28/2012. Limited evaluation by   echo, consider alternative modality if more complete evaluation   is indicated. - Left atrium: The atrium was severely dilated. - Right ventricle: The cavity size was mildly dilated. Systolic   function was mildly reduced. - Right atrium: The atrium was mildly to moderately dilated. - Atrial septum: No defect or patent foramen ovale was identified. - Inferior vena cava: The vessel was dilated. The respirophasic   diameter changes were blunted (< 50%), consistent with elevated   central venous pressure. - Technically difficult study. Echocontrast was used to enhance   visualization.  ASSESSMENT AND PLAN:  1. Acute on Chronic Decompensated CHF, in the setting of dilated cardiomyopathy: Massive fluid overload of approximately 30 lbs over 2 weeks time.  Due to this , best served with admission for IV diuretics. I have discussed this with Dr. Claiborne Billings, who is DOD today.  He has seen the patient as well and agrees with my plan.   Will admit for IV lasix 60 mg TID, hydralazine and nitrates. Daily weights and labs, along with strict I/O. He will be seen by the Advanced Heart Failure team during admission. This has been explained to him and he is willing to be admitted.   2. CKD Stage III: He is not a candidate for ACE/ARB or Entresto.   3. Hypertension: Elevated due to volume overload. He will be treated with nitrates and hydralazine for better control.   4.Atrial fib: Heart rate is not well controlled currently He will remain on Xarelto for anticoagulation. On metoprolol for HR control which may need adjustment during admission.    Current medicines are reviewed at length with the patient today.    Labs/ tests ordered today include: None-ADMIT.     Signed: Phill Myron. [place] Pugh, ANP, AACC  02/15/2018, 11:18 AM Co-Sign MD   Patient seen and examined. Agree with assessment and plan.  Patient was seen in the office today with Jory Sims, NP.  Patient is followed by Dr. Alvester Chou and has a history of a dilated cardiomyopathy with an EF of 20 to 25%, will insufficiency with most  creatinine 2.03, h/o abdominal aortic aneurysm, diabetes mellitus, hypertension, obstructive sleep apnea, and hyperlipidemia.  He has gained almost 30 pounds in 2 weeks and presents with massive edema, volume overload, significant dyspnea on exertion class III-IV heart failure symptoms.  With a significant change in volume status I have recommended hospitalization for initiation of intravenous diuretic therapy.  The patient is not a candidate for present initiation of ACE/ARB therapy,  Entresto or spironolactone in light of his current renal insufficiency.  Plan to initiate nitrate/hydralazine.  Follow-up laboratory including BNP.  He had been previously scheduled and has an office visit to see Dr. Aundra Dubin in the advanced heart failure clinic.  It may be prudent to initiate advanced heart  failure evaluation during his current hospitalization.   Troy Sine, MD, [facility] 02/15/2018 6:04 PM

## 2018-02-16 ENCOUNTER — Inpatient Hospital Stay (HOSPITAL_COMMUNITY): Payer: BC Managed Care – PPO

## 2018-02-16 LAB — GLUCOSE, CAPILLARY
Glucose-Capillary: 116 mg/dL — ABNORMAL HIGH (ref 70–99)
Glucose-Capillary: 125 mg/dL — ABNORMAL HIGH (ref 70–99)
Glucose-Capillary: 136 mg/dL — ABNORMAL HIGH (ref 70–99)
Glucose-Capillary: 100 mg/dL — ABNORMAL HIGH (ref 70–99)

## 2018-02-16 LAB — BASIC METABOLIC PANEL
Anion gap: 9 (ref 5–15)
BUN: 32 mg/dL — AB (ref 6–20)
CALCIUM: 8.6 mg/dL — AB (ref 8.9–10.3)
CHLORIDE: 103 mmol/L (ref 98–111)
CO2: 32 mmol/L (ref 22–32)
CREATININE: 1.93 mg/dL — AB (ref 0.61–1.24)
GFR calc Af Amer: 44 mL/min — ABNORMAL LOW (ref >60)
GFR calc non Af Amer: 38 mL/min — ABNORMAL LOW (ref >60)
GLUCOSE: 114 mg/dL — AB (ref 70–99)
Potassium: 3.3 mmol/L — ABNORMAL LOW (ref 3.5–5.1)
Sodium: 144 mmol/L (ref 135–145)

## 2018-02-16 MED ORDER — METOPROLOL SUCCINATE ER 50 MG PO TB24
50.0000 mg | ORAL_TABLET | Freq: Every day | ORAL | Status: DC
  Administered 2018-02-16 – 2018-02-19 (×4): 50 mg via ORAL
  Filled 2018-02-16 (×4): qty 1

## 2018-02-16 MED ORDER — INSULIN ASPART 100 UNIT/ML ~~LOC~~ SOLN: 0.0000 [IU] | Freq: Every day | SUBCUTANEOUS | Status: DC

## 2018-02-16 MED ORDER — INSULIN ASPART 100 UNIT/ML ~~LOC~~ SOLN
0.0000 [IU] | Freq: Three times a day (TID) | SUBCUTANEOUS | Status: DC
  Administered 2018-02-16 – 2018-02-18 (×3): 2 [IU] via SUBCUTANEOUS
  Administered 2018-02-18: 3 [IU] via SUBCUTANEOUS
  Administered 2018-02-19 – 2018-02-20 (×2): 2 [IU] via SUBCUTANEOUS
  Administered 2018-02-21 (×2): 3 [IU] via SUBCUTANEOUS
  Administered 2018-02-21 – 2018-02-23 (×3): 2 [IU] via SUBCUTANEOUS

## 2018-02-16 MED ORDER — POTASSIUM CHLORIDE CRYS ER 20 MEQ PO TBCR
40.0000 meq | EXTENDED_RELEASE_TABLET | Freq: Once | ORAL | Status: AC
  Administered 2018-02-16: 40 meq via ORAL
  Filled 2018-02-16: qty 2

## 2018-02-16 MED ORDER — MAGNESIUM SULFATE 4 GM/100ML IV SOLN
4.0000 g | Freq: Once | INTRAVENOUS | Status: AC
  Administered 2018-02-16: 4 g via INTRAVENOUS
  Filled 2018-02-16 (×3): qty 100

## 2018-02-16 MED ORDER — FUROSEMIDE 10 MG/ML IJ SOLN
80.0000 mg | Freq: Three times a day (TID) | INTRAMUSCULAR | Status: DC
  Administered 2018-02-16 – 2018-02-20 (×12): 80 mg via INTRAVENOUS
  Filled 2018-02-16 (×12): qty 8

## 2018-02-16 NOTE — Progress Notes (Signed)
CCMD notified that pt had 2 second pause. Cardiology paged.

## 2018-02-16 NOTE — Progress Notes (Signed)
RN just notified that at 445 patient had a 6 second pause. Will notify MD.

## 2018-02-16 NOTE — Progress Notes (Signed)
Pt HR dropped into the 30s. Pt was asleep. Denies any chest pain. MD notified and no orders given

## 2018-02-16 NOTE — Consult Note (Addendum)
Advanced Heart Failure Team Consult Note   Primary Physician: [name], MD PCP-Cardiologist:  Quay Burow, MD  Reason for Consultation: Acute on chronic systolic CHF  HPI:    Jay Chen is seen today for evaluation of Acute on chronic systolic CHF at the request of Dr. Gwenlyn Found.   Jay Chen is a 52 y.o. male with acute on chronic combined CHF, presumed non-ischemic (no cath), EF 20-25%, AAA 4.7 cm by echo, hx of CVA, HTN, HLD, OSA, DM2, CKD III (Baseline Cr 1.7 - 1.9), Chronic Afib, and morbid obesity (Body mass index is 56.56 kg/m.)  Pt admitted 12/2017 for left temporal lobe infarct CVA in setting of Afib RVR. On Xarelto. Only remaining deficit was LLE weakness that has been gradually improving with therapy.   Seen in [facility] office 01/25/18 and was noted to have uncontrolled atrial fib HR. Metoprolol was increased to 150 mg daily from 100 mg daily. He was non-compliant with CPAP making it difficult to control atrial fib. A repeat sleep study was ordered.  Seen in follow up 02/15/18 with worsening dyspnea, and 27 lb weight gain from his appointment 01/25/18 with pitting edema into this thighs with weeping and skin blisters. Admitted dietary non-compliance. Lasix recently reduced due to worsening creatinine. Admitted to [facility] for further evaluation and treatment and assessment by the HF team. Pertinent labs on admission include K 3.8, Cr 2.06, BNP 847.1, WBC 5.9, Hgb 13.2. EKG with Afib 81 bpm, personally reviewed.   Feeling better today. Down 8 lbs with IV lasix 60 mg TID. Cr 2.0 -> 1.93. K 3.3 with diuresis. His heart problems have all started this year. Afib new as of April 2019. Rate control only has been pursued. Has been on amiodarone and Xarelto with compliance. Has not had cath with multiple re-admissions. He is unsure of his baseline weight, but is up at least 20 lbs by [facility] clinic scales. Has edema up into this thighs. Denies palpitations, lightheadedness or dizziness. No  CP or history of. He has had sleep apnea for "years" but has never been tolerant or compliant with CPAP. Doesn't smoke, use ETOH, and denies substance abuse. Family history of HTN. He is not very active. Able to walk around a grocery store pushing a cart with occasional SOB. No SOB with ADLs. + Orthopnea. Sleeps on side or several pillows.   Echo 01/01/18 LVEF 20-25%, AAA 4.7 cm, Severe LAE, RV mild dilated, mild reduced RV function, mild/mod RAE.  Review of Systems: [y] = yes, _0  = no   General: Weight gain [y]; Weight loss _1 ; Anorexia _2 ; Fatigue _3 ; Fever _4 ; Chills _5 ; Weakness _6   Cardiac: Chest pain/pressure _7 ; Resting SOB _8 ; Exertional SOB [y]; Orthopnea [y]; Pedal Edema [y]; Palpitations _9 ; Syncope _10 ; Presyncope _11 ; Paroxysmal nocturnal dyspnea_12   Pulmonary: Cough _13 ; Wheezing_14 ; Hemoptysis_15 ; Sputum _16 ; Snoring _17   GI: Vomiting_18 ; Dysphagia_19 ; Melena_20 ; Hematochezia _21 ; Heartburn_22 ; Abdominal pain _23 ; Constipation _24 ; Diarrhea _25 ; BRBPR _26   GU: Hematuria_27 ; Dysuria _28 ; Nocturia_29   Vascular: Pain in legs with walking _30 ; Pain in feet with lying flat _31 ; Non-healing sores _32 ; Stroke _33 ; TIA _34 ; Slurred speech _35 ;  Neuro: Headaches_36 ; Vertigo_37 ; Seizures_38 ; Paresthesias_39 ;Blurred vision _40 ; Diplopia _41 ; Vision changes _42   Ortho/Skin: Arthritis [y]; Joint  pain [y]; Muscle pain _0 ; Joint swelling _1 ; Back Pain _2 ; Rash _3   Psych: Depression_4 ; Anxiety_5   Heme: Bleeding problems _6 ; Clotting disorders _7 ; Anemia _8   Endocrine: Diabetes _9 ; Thyroid dysfunction_10   Home Medications Prior to Admission medications   Medication Sig Start Date End Date Taking? Authorizing Provider  acetaminophen (TYLENOL) 325 MG tablet Take 2 tablets (650 mg total) by mouth every 6 (six) hours as needed for mild pain or headache (or Fever >/= 101). 01/04/18   Elgergawy, Silver Huguenin, MD  allopurinol (ZYLOPRIM) 100 MG tablet Take 1 tablet (100 mg total) by mouth  daily. 01/16/18   Angiulli, Lavon Paganini, PA-C  amiodarone (PACERONE) 200 MG tablet Take 1 tablet (200 mg total) by mouth daily. 01/17/18   Angiulli, Lavon Paganini, PA-C  atorvastatin (LIPITOR) 80 MG tablet Take 1 tablet (80 mg total) by mouth daily. 01/16/18   Angiulli, Lavon Paganini, PA-C  Blood Glucose Monitoring Suppl (ACCU-CHEK GUIDE) w/Device KIT 1 Act by Does not apply route 3 (three) times daily. 01/24/18   [name], MD  furosemide (LASIX) 40 MG tablet Take 1 tablet (40 mg total) by mouth daily. Patient taking differently: Take 40 mg by mouth daily. Alternating 1/2 tablet one day and then one whole tablet the next day 01/16/18 04/16/18  Angiulli, Lavon Paganini, PA-C  glucose blood (ACCU-CHEK GUIDE) test strip 1 each by Other route 3 (three) times daily. Use as instructed 01/24/18   [name], MD  magnesium oxide (MAG-OX) 400 MG tablet Take 1 tablet (400 mg total) by mouth daily. 01/16/18   Angiulli, Lavon Paganini, PA-C  methocarbamol (ROBAXIN) 500 MG tablet Take 1 tablet (500 mg total) by mouth 3 (three) times daily. 01/16/18   Angiulli, Lavon Paganini, PA-C  metoprolol succinate (TOPROL-XL) 100 MG 24 hr tablet Take one and one-half tablets (130m) by mouth daily. Take with or immediately following a meal. 01/25/18   LLendon Colonel NP  omega-3 acid ethyl esters (LOVAZA) 1 g capsule Take 2 capsules (2 g total) by mouth 2 (two) times daily. 01/16/18   Angiulli, DLavon Paganini PA-C  potassium chloride SA (KLOR-CON M20) 20 MEQ tablet Take 2 tablets (40 mEq total) by mouth daily. 01/16/18   Angiulli, DLavon Paganini PA-C  rivaroxaban (XARELTO) 20 MG TABS tablet Take 1 tablet (20 mg total) by mouth daily with supper. 01/16/18   Angiulli, DLavon Paganini PA-C   Past Medical History: Past Medical History:  Diagnosis Date  . CKD (chronic kidney disease), stage III ([place]   . Congestive heart failure ([facility] May of 2011   Felt to have cor pulmonale; EF 45 to 50% from echo in May 2011  . Cor pulmonale (chronic) ([place]   . Gout    "take daily  RX" (02/15/2018)  . History of kidney stones   . Hyperlipidemia   . Hypertension   . Morbid obesity ([place]   . Persistent atrial fibrillation ([place]    /[name]8/11/2017  . Sleep apnea    "dx'd; couldn't tolerate CPAP" (02/15/2018)  . Thoracic aneurysm    a. 4.8cm thoracic aortic aneurysm by CT 2013.  .Marland KitchenThoracic aortic aneurysm ([place]    known/notes 12/28/2017  . Type II diabetes mellitus ([place]    Past Surgical History: Past Surgical History:  Procedure Laterality Date  . LAPAROSCOPIC CHOLECYSTECTOMY  2000  . [place]ARD[place]GRAPHY  09/21/2009   EF 45-50%; Cavity size was severely dilated, severe concentric hypertrophy and normal  wall motion   Family History: Family History  Problem Relation Age of Onset  . Hypertension Mother   . Pancreatic cancer Father   . Prostate cancer Father   . Colon cancer Father    Social History: Social History   Socioeconomic History  . Marital status: Married    Spouse name: Not on file  . Number of children: Not on file  . Years of education: Not on file  . Highest education level: Not on file  Occupational History  . Not on file  Social Needs  . Financial resource strain: Not on file  . Food insecurity:    Worry: Not on file    Inability: Not on file  . Transportation needs:    Medical: Not on file    Non-medical: Not on file  Tobacco Use  . Smoking status: Never Smoker  . Smokeless tobacco: Never Used  Substance and Sexual Activity  . Alcohol use: Not Currently    Comment: 02/15/2018 "nothing in the last couple years"  . Drug use: Never  . Sexual activity: Yes  Lifestyle  . Physical activity:    Days per week: Not on file    Minutes per session: Not on file  . Stress: Not on file  Relationships  . Social connections:    Talks on phone: Not on file    Gets together: Not on file    Attends religious service: Not on file    Active member of club or organization: Not on file    Attends meetings of clubs or organizations: Not on file      Relationship status: Not on file  Other Topics Concern  . Not on file  Social History Narrative  . Not on file   Allergies: No Known Allergies  Objective:    Vital Signs:   Temp:  [97.6 F (36.4 C)-98.7 F (37.1 C)] 98.7 F (37.1 C) (09/26 0813) Pulse Rate:  [62-85] 81 (09/26 0813) Resp:  [18-20] 18 (09/26 0813) BP: (132-151)/(97-113) 143/101 (09/26 0813) SpO2:  [90 %-94 %] 92 % (09/26 0813) Weight:  [168.7 kg-172.5 kg] 168.7 kg (09/26 0517) Last BM Date: 02/15/18  Weight change: Filed Weights   02/15/18 1545 02/16/18 0517  Weight: (!) 172.5 kg (!) 168.7 kg   Intake/Output:   Intake/Output Summary (Last 24 hours) at 02/16/2018 0930 Last data filed at 02/16/2018 0914 Gross per 24 hour  Intake 480 ml  Output 3275 ml  Net -2795 ml    Physical Exam    General:  Obese. NAD.  HEENT: normal Neck: supple. JVP to ear. Carotids 2+ bilat; no bruits. No lymphadenopathy or thyromegaly appreciated. Cor: PMI nondisplaced. Irregularly, irregular. No rubs, gallops or murmurs. Lungs: Diminished throughout with basilar crackles.  Abdomen: Markedly obese, soft, nontender, nondistended. No hepatosplenomegaly. No bruits or masses. Good bowel sounds. Extremities: no cyanosis, clubbing, or rash. 2-3+ edema into thighs. Slightly cool to the touch. Neuro: alert & orientedx3, cranial nerves grossly intact. moves all 4 extremities w/o difficulty. Affect pleasant  Telemetry   Atrial Fibrillation 70-80s, overnight had several pauses with reported rates in 30s, personally reviewed  EKG    Afib 81 bpm, personally reviewed.   Labs   Basic Metabolic Panel: Recent Labs  Lab 02/15/18 1559 02/16/18 0451  NA 143 144  K 3.8 3.3*  CL 104 103  CO2 30 32  GLUCOSE 114* 114*  BUN 32* 32*  CREATININE 2.06* 1.93*  CALCIUM 8.8* 8.6*  MG 1.6*  --  Liver Function Tests: Recent Labs  Lab 02/15/18 1559  AST 44*  ALT 80*  ALKPHOS 107  BILITOT 1.2  PROT 6.3*  ALBUMIN 2.9*   No  results for input(s): LIPASE, AMYLASE in the last 168 hours. No results for input(s): AMMONIA in the last 168 hours.  CBC: Recent Labs  Lab 02/15/18 1559  WBC 5.9  NEUTROABS 4.0  HGB 13.2  HCT 41.8  MCV 100.7*  PLT 228    Cardiac Enzymes: No results for input(s): CKTOTAL, CKMB, CKMBINDEX, TROPONINI in the last 168 hours.  BNP: BNP (last 3 results) Recent Labs    12/27/17 2354 02/15/18 1559  BNP 283.3* 847.1*    ProBNP (last 3 results) No results for input(s): PROBNP in the last 8760 hours.   CBG: Recent Labs  Lab 02/15/18 1631 02/15/18 2125 02/16/18 0745  GLUCAP 109* 128* 116*    Coagulation Studies: No results for input(s): LABPROT, INR in the last 72 hours.   Imaging    No results found.   Medications:     Current Medications: . allopurinol  100 mg Oral Daily  . amiodarone  200 mg Oral Daily  . aspirin EC  81 mg Oral Daily  . atorvastatin  80 mg Oral q1800  . furosemide  60 mg Intravenous Q8H  . hydrALAZINE  25 mg Oral Q8H  . isosorbide mononitrate  30 mg Oral Daily  . omega-3 acid ethyl esters  1 g Oral BID  . potassium chloride  40 mEq Oral Daily  . rivaroxaban  20 mg Oral Q supper  . sodium chloride flush  3 mL Intravenous Q12H     Infusions: . sodium chloride         Patient Profile   Jay Chen is a 52 y.o. male with acute on chronic combined CHF, presumed non-ischemic (no cath), EF 20-25%, AAA 4.7 cm by echo, hx of CVA, HTN, HLD, OSA, DM2, CKD III (Baseline Cr 1.7 - 1.9), and morbid obesity (Body mass index is 56.56 kg/m.)  Admitted from [facility] 02/15/18 with acute on chronic combined CHF and marked volume overload.   Assessment/Plan   1. Acute on chronic combined CHF, presumed non-ischemic (no cath) - Echo 12/2017 EF 20-25% - Volume status markedly elevated on exam. Suspect 20-30 lbs of fluid remain - Once diuresed, eventually needs R/LHC - Increase lasix to 80 mg TID.  - Continue hydralazine 25 mg TID -  Continue imdur 30 mg daily - No ARB/ARNi/spiro currently with AKI and CKD III - Consider spiro,  - May need to consider dig, but cautiously with CKD III  2. AKI on CKD III - Baseline Cr 1.7 - 1.9 - 2.0 on admit, not far from baseline. Follow with diuresis.   3. Persistent Afib - New diagnosis 08/2017, no rhythm strategy attempted - Chronicity, non-compliance with CPAP, and LAE make him marginal candidate for DCCV. Will discuss with MD. If plan DCCV, will switch amiodarone to gtt.   4. H/o CVA - Only remaining deficit.  - On Xarelto chronically.   5. HTN - Will adjust meds in setting of treating CHF - Cr borderline for ARB/ARNi/Spiro.   6. AAA  - 4.7 cm by echo 12/2017   7. OSA - Non-compliant with CPAP. He seems willing to try different masks than the one he tried "years" ago.   8. DM2 - SSI while in house.  - Hgb A1C 7.1 01/24/18  9. Morbid obesity - Body mass index is 56.56 kg/m.  -  Needs significant weight loss.   Marked volume overload. Suspect NICM, but no ischemic work up thus far. Too large for cMRI. Will need to consider rhythm control.   Medication concerns reviewed with patient and pharmacy team. Barriers identified: Questionable compliance.   Length of Stay: 1  Annamaria Helling  02/16/2018, 9:30 AM  Advanced Heart Failure Team Pager [phone] (M-F; 7a - 4p)  Please contact [facility] for night-coverage after hours (4p -7a ) and weekends on [email or web address]  Patient seen and examined with the above-signed Advanced Practice Provider and/or Housestaff. I personally reviewed laboratory data, imaging studies and relevant notes. I independently examined the patient and formulated the important aspects of the plan. I have edited the note to reflect any of my changes or salient points. I have personally discussed the plan with the patient and/or family.  52 y/o morbidly obese male with multiple CRFs admitted acute on chronic systolic HF. First diagnosed with  systolic HF with EF 24% in 4/19 in setting of AF. He has remained in AF but is rate controlled. Experienced CVA recently with no significant residual. Has OSA but unable to tolerate CPAP. Currently markedly volume overloaded. Will continue IV lasix.  Plan R/LHC on Monday.   Glori Bickers, MD  6:27 PM

## 2018-02-16 NOTE — Progress Notes (Signed)
Talked to heart failure team about patient having pauses overnight.

## 2018-02-16 NOTE — Progress Notes (Signed)
Patient very drowsy, takes naps throughout the day, but easily aroused. Will continue to monitor.

## 2018-02-16 NOTE — Plan of Care (Signed)
  Problem: Education: Goal: Knowledge of General Education information will improve Description: Including pain rating scale, medication(s)/side effects and non-pharmacologic comfort measures Outcome: Progressing   Problem: Activity: Goal: Risk for activity intolerance will decrease Outcome: Progressing   Problem: Nutrition: Goal: Adequate nutrition will be maintained Outcome: Progressing   

## 2018-02-16 NOTE — Plan of Care (Signed)
  Problem: Education: Goal: Knowledge of General Education information will improve Description Including pain rating scale, medication(s)/side effects and non-pharmacologic comfort measures Outcome: Progressing   Problem: Health Behavior/Discharge Planning: Goal: Ability to manage health-related needs will improve Outcome: Progressing   

## 2018-02-16 NOTE — Progress Notes (Signed)
Patient with 2.69 pause.

## 2018-02-17 LAB — BASIC METABOLIC PANEL
Anion gap: 10 (ref 5–15)
BUN: 30 mg/dL — ABNORMAL HIGH (ref 6–20)
CALCIUM: 8.5 mg/dL — AB (ref 8.9–10.3)
CHLORIDE: 98 mmol/L (ref 98–111)
CO2: 34 mmol/L — AB (ref 22–32)
CREATININE: 1.94 mg/dL — AB (ref 0.61–1.24)
GFR calc Af Amer: 43 mL/min — ABNORMAL LOW (ref >60)
GFR calc non Af Amer: 37 mL/min — ABNORMAL LOW (ref >60)
GLUCOSE: 125 mg/dL — AB (ref 70–99)
Potassium: 3.6 mmol/L (ref 3.5–5.1)
Sodium: 142 mmol/L (ref 135–145)

## 2018-02-17 LAB — GLUCOSE, CAPILLARY
GLUCOSE-CAPILLARY: 140 mg/dL — AB (ref 70–99)
GLUCOSE-CAPILLARY: 140 mg/dL — AB (ref 70–99)
Glucose-Capillary: 120 mg/dL — ABNORMAL HIGH (ref 70–99)
Glucose-Capillary: 112 mg/dL — ABNORMAL HIGH (ref 70–99)

## 2018-02-17 LAB — MAGNESIUM: Magnesium: 1.8 mg/dL (ref 1.7–2.4)

## 2018-02-17 MED ORDER — HYDRALAZINE HCL 25 MG PO TABS
25.0000 mg | ORAL_TABLET | Freq: Once | ORAL | Status: AC
  Administered 2018-02-17: 25 mg via ORAL
  Filled 2018-02-17: qty 1

## 2018-02-17 MED ORDER — HEPARIN (PORCINE) IN NACL 100-0.45 UNIT/ML-% IJ SOLN
1800.0000 [IU]/h | INTRAMUSCULAR | Status: DC
  Administered 2018-02-17: 1500 [IU]/h via INTRAVENOUS
  Administered 2018-02-19 – 2018-02-20 (×3): 1700 [IU]/h via INTRAVENOUS
  Filled 2018-02-17 (×4): qty 250

## 2018-02-17 MED ORDER — POTASSIUM CHLORIDE CRYS ER 20 MEQ PO TBCR
30.0000 meq | EXTENDED_RELEASE_TABLET | Freq: Once | ORAL | Status: AC
  Administered 2018-02-17: 30 meq via ORAL
  Filled 2018-02-17: qty 1

## 2018-02-17 MED ORDER — HYDRALAZINE HCL 50 MG PO TABS
50.0000 mg | ORAL_TABLET | Freq: Three times a day (TID) | ORAL | Status: DC
  Administered 2018-02-17 – 2018-02-20 (×9): 50 mg via ORAL
  Filled 2018-02-17 (×9): qty 1

## 2018-02-17 MED ORDER — MAGNESIUM SULFATE 2 GM/50ML IV SOLN
2.0000 g | Freq: Once | INTRAVENOUS | Status: AC
  Administered 2018-02-17: 2 g via INTRAVENOUS
  Filled 2018-02-17: qty 50

## 2018-02-17 NOTE — Progress Notes (Signed)
ANTICOAGULATION CONSULT NOTE - Initial Consult  Pharmacy Consult for heparin Indication: atrial fibrillation  No Known Allergies  Patient Measurements: Height: 5\' 8"  (172.7 cm) Weight: (!) 363 lb 3.2 oz (164.7 kg)(scale c) IBW/kg (Calculated) : 68.4 Heparin Dosing Weight: 111kg  Vital Signs: Temp: 99.2 F (37.3 C) (09/27 0747) Temp Source: Oral (09/27 0747) BP: 162/97 (09/27 0747) Pulse Rate: 90 (09/27 0747)  Labs: Recent Labs    02/15/18 1559 02/16/18 0451 02/17/18 0452  HGB 13.2  --   --   HCT 41.8  --   --   PLT 228  --   --   CREATININE 2.06* 1.93* 1.94*    Estimated Creatinine Clearance: 65.8 mL/min (A) (by C-G formula based on SCr of 1.94 mg/dL (H)).   Medical History: Past Medical History:  Diagnosis Date  . CKD (chronic kidney disease), stage III ([place])   . Congestive heart failure [facility]) May of 2011   Felt to have cor pulmonale; EF 45 to 50% from echo in May 2011  . Cor pulmonale (chronic) ([place])   . Gout    "take daily RX" (02/15/2018)  . History of kidney stones   . Hyperlipidemia   . Hypertension   . Morbid obesity ([place])   . Persistent atrial fibrillation ([place])    [name] 12/28/2017  . Sleep apnea    "dx'd; couldn't tolerate CPAP" (02/15/2018)  . Thoracic aneurysm    a. 4.8cm thoracic aortic aneurysm by CT 2013.  [name] Thoracic aortic aneurysm ([place])    known/notes 12/28/2017  . Type II diabetes mellitus (HCC)      Assessment: 49 yoM on Xarelto PTA for hx PAF and CVA. Pt admitted with acute CHF, planning to undergo cardiac cath on Monday so will transition to IV heparin. Last dose of Xarelto was 9/26 at 1800.   Goal of Therapy:  Heparin level 0.3-0.7 units/ml aPTT 66-102 seconds Monitor platelets by anticoagulation protocol: Yes   Plan:  -Hold xarelto for now -Start IV heparin at 1500 units/hr no bolus at 1800 tonight -Check 6hr aPTT/heparin level   Arrie Senate, PharmD, BCPS Clinical Pharmacist [phone] Please check AMION for all [facility]  Pharmacy numbers 02/17/2018

## 2018-02-17 NOTE — Plan of Care (Signed)
  Problem: Education: Goal: Knowledge of General Education information will improve Description Including pain rating scale, medication(s)/side effects and non-pharmacologic comfort measures Outcome: Progressing   Problem: Coping: Goal: Level of anxiety will decrease Outcome: Progressing   Problem: Elimination: Goal: Will not experience complications related to bowel motility Outcome: Progressing Goal: Will not experience complications related to urinary retention Outcome: Progressing   Problem: Skin Integrity: Goal: Risk for impaired skin integrity will decrease Outcome: Progressing

## 2018-02-17 NOTE — Plan of Care (Signed)
  Problem: Education: Goal: Knowledge of General Education information will improve Description Including pain rating scale, medication(s)/side effects and non-pharmacologic comfort measures Outcome: Progressing   Problem: Health Behavior/Discharge Planning: Goal: Ability to manage health-related needs will improve Outcome: Progressing   Problem: Clinical Measurements: Goal: Ability to maintain clinical measurements within normal limits will improve Outcome: Progressing Goal: Will remain free from infection Outcome: Progressing Goal: Diagnostic test results will improve Outcome: Progressing Goal: Respiratory complications will improve Outcome: Progressing Goal: Cardiovascular complication will be avoided Outcome: Progressing   Problem: Clinical Measurements: Goal: Will remain free from infection Outcome: Progressing   Problem: Clinical Measurements: Goal: Diagnostic test results will improve Outcome: Progressing   Problem: Clinical Measurements: Goal: Respiratory complications will improve Outcome: Progressing   Problem: Clinical Measurements: Goal: Cardiovascular complication will be avoided Outcome: Progressing   Problem: Activity: Goal: Risk for activity intolerance will decrease Outcome: Progressing   Problem: Nutrition: Goal: Adequate nutrition will be maintained Outcome: Progressing   Problem: Coping: Goal: Level of anxiety will decrease Outcome: Progressing   Problem: Elimination: Goal: Will not experience complications related to bowel motility Outcome: Progressing Goal: Will not experience complications related to urinary retention Outcome: Progressing   Problem: Skin Integrity: Goal: Risk for impaired skin integrity will decrease Outcome: Progressing   Problem: Education: Goal: Ability to demonstrate management of disease process will improve Outcome: Progressing Goal: Ability to verbalize understanding of medication therapies will  improve Outcome: Progressing Goal: Individualized Educational Video(s) Outcome: Progressing

## 2018-02-17 NOTE — Progress Notes (Signed)
Heparin drip verified by Arlina Robes RN  At 15 ml/hr

## 2018-02-17 NOTE — Plan of Care (Signed)
Problem: Education: Goal: Knowledge of General Education information will improve Description Including pain rating scale, medication(s)/side effects and non-pharmacologic comfort measures 02/17/2018 1856 by Rolm Baptise, RN Outcome: Progressing 02/17/2018 1850 by Rolm Baptise, RN Outcome: Progressing   Problem: Health Behavior/Discharge Planning: Goal: Ability to manage health-related needs will improve 02/17/2018 1856 by Rolm Baptise, RN Outcome: Progressing 02/17/2018 1850 by Rolm Baptise, RN Outcome: Progressing   Problem: Clinical Measurements: Goal: Ability to maintain clinical measurements within normal limits will improve 02/17/2018 1856 by Rolm Baptise, RN Outcome: Progressing 02/17/2018 1850 by Rolm Baptise, RN Outcome: Progressing Goal: Will remain free from infection 02/17/2018 1856 by Rolm Baptise, RN Outcome: Progressing 02/17/2018 1850 by Rolm Baptise, RN Outcome: Progressing Goal: Diagnostic test results will improve 02/17/2018 1856 by Rolm Baptise, RN Outcome: Progressing 02/17/2018 1850 by Karyl Kinnier D, RN Outcome: Progressing Goal: Respiratory complications will improve 02/17/2018 1856 by Rolm Baptise, RN Outcome: Progressing 02/17/2018 1850 by Rolm Baptise, RN Outcome: Progressing Goal: Cardiovascular complication will be avoided 02/17/2018 1856 by Rolm Baptise, RN Outcome: Progressing 02/17/2018 1850 by Rolm Baptise, RN Outcome: Progressing   Problem: Clinical Measurements: Goal: Will remain free from infection 02/17/2018 1856 by Rolm Baptise, RN Outcome: Progressing 02/17/2018 1850 by Rolm Baptise, RN Outcome: Progressing   Problem: Clinical Measurements: Goal: Diagnostic test results will improve 02/17/2018 1856 by Rolm Baptise, RN Outcome: Progressing 02/17/2018 1850 by Karyl Kinnier D, RN Outcome: Progressing   Problem: Clinical Measurements: Goal: Respiratory complications will improve 02/17/2018  1856 by Rolm Baptise, RN Outcome: Progressing 02/17/2018 1850 by Karyl Kinnier D, RN Outcome: Progressing   Problem: Clinical Measurements: Goal: Cardiovascular complication will be avoided 02/17/2018 1856 by Rolm Baptise, RN Outcome: Progressing 02/17/2018 1850 by Rolm Baptise, RN Outcome: Progressing   Problem: Activity: Goal: Risk for activity intolerance will decrease 02/17/2018 1856 by Rolm Baptise, RN Outcome: Progressing 02/17/2018 1850 by Rolm Baptise, RN Outcome: Progressing   Problem: Nutrition: Goal: Adequate nutrition will be maintained 02/17/2018 1856 by Rolm Baptise, RN Outcome: Progressing 02/17/2018 1850 by Karyl Kinnier D, RN Outcome: Progressing   Problem: Coping: Goal: Level of anxiety will decrease 02/17/2018 1856 by Rolm Baptise, RN Outcome: Progressing 02/17/2018 1850 by Rolm Baptise, RN Outcome: Progressing   Problem: Elimination: Goal: Will not experience complications related to bowel motility 02/17/2018 1856 by Rolm Baptise, RN Outcome: Progressing 02/17/2018 1850 by Rolm Baptise, RN Outcome: Progressing Goal: Will not experience complications related to urinary retention 02/17/2018 1856 by Rolm Baptise, RN Outcome: Progressing 02/17/2018 1850 by Rolm Baptise, RN Outcome: Progressing   Problem: Elimination: Goal: Will not experience complications related to urinary retention 02/17/2018 1856 by Rolm Baptise, RN Outcome: Progressing 02/17/2018 1850 by Rolm Baptise, RN Outcome: Progressing   Problem: Pain Managment: Goal: General experience of comfort will improve 02/17/2018 1856 by Rolm Baptise, RN Outcome: Progressing 02/17/2018 1850 by Rolm Baptise, RN Outcome: Progressing   Problem: Safety: Goal: Ability to remain free from injury will improve 02/17/2018 1856 by Rolm Baptise, RN Outcome: Progressing 02/17/2018 1850 by Rolm Baptise, RN Outcome: Progressing   Problem: Skin Integrity: Goal:  Risk for impaired skin integrity will decrease 02/17/2018 1856 by Rolm Baptise, RN Outcome: Progressing 02/17/2018 1850 by Rolm Baptise, RN Outcome: Progressing   Problem: Education: Goal: Ability to demonstrate management of disease process will improve 02/17/2018 1856 by  Karyl Kinnier D, RN Outcome: Progressing 02/17/2018 1850 by Rolm Baptise, RN Outcome: Progressing Goal: Ability to verbalize understanding of medication therapies will improve 02/17/2018 1856 by Rolm Baptise, RN Outcome: Progressing 02/17/2018 1850 by Rolm Baptise, RN Outcome: Progressing Goal: Individualized Educational Video(s) 02/17/2018 1856 by Rolm Baptise, RN Outcome: Progressing 02/17/2018 1850 by Rolm Baptise, RN Outcome: Progressing   Problem: Education: Goal: Ability to verbalize understanding of medication therapies will improve 02/17/2018 1856 by Rolm Baptise, RN Outcome: Progressing 02/17/2018 1850 by Rolm Baptise, RN Outcome: Progressing   Problem: Education: Goal: Individualized Educational Video(s) 02/17/2018 1856 by Rolm Baptise, RN Outcome: Progressing 02/17/2018 1850 by Rolm Baptise, RN Outcome: Progressing   Problem: Activity: Goal: Capacity to carry out activities will improve 02/17/2018 1856 by Rolm Baptise, RN Outcome: Progressing 02/17/2018 1850 by Rolm Baptise, RN Outcome: Progressing   Problem: Cardiac: Goal: Ability to achieve and maintain adequate cardiopulmonary perfusion will improve 02/17/2018 1856 by Rolm Baptise, RN Outcome: Progressing 02/17/2018 1850 by Rolm Baptise, RN Outcome: Progressing   Problem: Education: Goal: Understanding of CV disease, CV risk reduction, and recovery process will improve 02/17/2018 1856 by Rolm Baptise, RN Outcome: Progressing 02/17/2018 1850 by Rolm Baptise, RN Outcome: Progressing Goal: Individualized Educational Video(s) 02/17/2018 1856 by Rolm Baptise, RN Outcome: Progressing 02/17/2018  1850 by Rolm Baptise, RN Outcome: Progressing   Problem: Education: Goal: Individualized Educational Video(s) 02/17/2018 1856 by Rolm Baptise, RN Outcome: Progressing 02/17/2018 1850 by Rolm Baptise, RN Outcome: Progressing   Problem: Activity: Goal: Ability to return to baseline activity level will improve 02/17/2018 1856 by Rolm Baptise, RN Outcome: Progressing 02/17/2018 1850 by Rolm Baptise, RN Outcome: Progressing   Problem: Cardiovascular: Goal: Ability to achieve and maintain adequate cardiovascular perfusion will improve 02/17/2018 1856 by Rolm Baptise, RN Outcome: Progressing 02/17/2018 1850 by Rolm Baptise, RN Outcome: Progressing Goal: Vascular access site(s) Level 0-1 will be maintained 02/17/2018 1856 by Rolm Baptise, RN Outcome: Progressing 02/17/2018 1850 by Rolm Baptise, RN Outcome: Progressing   Problem: Health Behavior/Discharge Planning: Goal: Ability to safely manage health-related needs after discharge will improve 02/17/2018 1856 by Rolm Baptise, RN Outcome: Progressing 02/17/2018 1850 by Rolm Baptise, RN Outcome: Progressing

## 2018-02-17 NOTE — Care Management Note (Signed)
Case Management Note  Patient Details  Name: Jay Chen MRN: [id number] Date of Birth: 11/18/1965  Subjective/Objective:                 Patient admitted w CHF.    Action/Plan:  Patient from home w wife. He states he has home oxygen through [facility]. He states he is active w [facility] for PT. He states that he has a walker and CPAP ([facility]). He states that is scheduled to have another at home sleep study in October to address his ill fitting mask.  Will need resumption orders for [facility]. Notified N[facility] of admission.  Expected Discharge Date:                  Expected Discharge Plan:     In-House Referral:     Discharge planning Services  CM Consult  Post Acute Care Choice:    Choice offered to:     DME Arranged:    DME Agency:     HH Arranged:    [place] Agency:  [place]  Status of Service:  In process, will continue to follow  If discussed at Long Length of Stay Meetings, dates discussed:    Additional Comments:  Carles Collet, RN 02/17/2018, 2:20 PM

## 2018-02-17 NOTE — Progress Notes (Addendum)
Advanced Heart Failure Rounding Note  PCP:  Primary Cardiologist:   Subjective:   Echo 12/2017 EF 20-25%  Pt still with orthopnea, overall feels his breathing has improved some, walked yesterday with assistance slow but reports doing well not getting SOB.   No cp or PND sleeps upright using O2 at night not tolerating cpap. Asking about diet counseling.   Objective:   Weight Range: (!) 164.7 kg Body mass index is 55.22 kg/m.   Vital Signs:   Temp:  [98 F (36.7 C)-99.2 F (37.3 C)] 99.2 F (37.3 C) (09/27 0747) Pulse Rate:  [42-92] 90 (09/27 0747) Resp:  [18] 18 (09/26 2044) BP: (112-162)/(53-119) 162/97 (09/27 0747) SpO2:  [90 %-94 %] 94 % (09/27 0747) Weight:  [164.7 kg] 164.7 kg (09/27 0700) Last BM Date: 02/17/18  Weight change: Filed Weights   02/15/18 1545 02/16/18 0517 02/17/18 0700  Weight: (!) 172.5 kg (!) 168.7 kg (!) 164.7 kg    Intake/Output:   Intake/Output Summary (Last 24 hours) at 02/17/2018 0909 Last data filed at 02/17/2018 0740 Gross per 24 hour  Intake 1840.14 ml  Output 2775 ml  Net -934.86 ml      Physical Exam    General:  Obese male sitting upright sleeping. No resp difficulty HEENT: Normal anicteric  Neck: Supple. JVP hard to see . Carotids 2+ bilat; no bruits. No lymphadenopathy or thyromegaly appreciated. Cor:  Irregularly irregular, normal rate. No rubs, gallops or murmurs appreciated but heart sounds distant. Lungs: distant breath sounds no wheeze Abdomen: markedly obese Soft, nontender, nondistended. No hepatosplenomegaly. No bruits or masses. Good bowel sounds. Extremities: No cyanosis, clubbing, rash, 3+ LE edema Neuro: alert & oriented x 3, cranial nerves grossly intact. moves all 4 extremities w/o difficulty. Affect pleasant   Telemetry   afib rate variable in last 24: 80s-115 early yesterday evening bradycardia upper 30's  EKG    None this am  Labs    CBC Recent Labs    02/15/18 1559  WBC 5.9  NEUTROABS 4.0    HGB 13.2  HCT 41.8  MCV 100.7*  PLT 938   Basic Metabolic Panel Recent Labs    02/15/18 1559 02/16/18 0451 02/17/18 0452  NA 143 144 142  K 3.8 3.3* 3.6  CL 104 103 98  CO2 30 32 34*  GLUCOSE 114* 114* 125*  BUN 32* 32* 30*  CREATININE 2.06* 1.93* 1.94*  CALCIUM 8.8* 8.6* 8.5*  MG 1.6*  --  1.8   Liver Function Tests Recent Labs    02/15/18 1559  AST 44*  ALT 80*  ALKPHOS 107  BILITOT 1.2  PROT 6.3*  ALBUMIN 2.9*   No results for input(s): LIPASE, AMYLASE in the last 72 hours. Cardiac Enzymes No results for input(s): CKTOTAL, CKMB, CKMBINDEX, TROPONINI in the last 72 hours.  BNP: BNP (last 3 results) Recent Labs    12/27/17 2354 02/15/18 1559  BNP 283.3* 847.1*    ProBNP (last 3 results) No results for input(s): PROBNP in the last 8760 hours.   D-Dimer No results for input(s): DDIMER in the last 72 hours. Hemoglobin A1C No results for input(s): HGBA1C in the last 72 hours. Fasting Lipid Panel No results for input(s): CHOL, HDL, LDLCALC, TRIG, CHOLHDL, LDLDIRECT in the last 72 hours. Thyroid Function Tests No results for input(s): TSH, T4TOTAL, T3FREE, THYROIDAB in the last 72 hours.  Invalid input(s): FREET3  Other results:   Imaging    Dg Chest 2 View  Result Date: 02/16/2018  CLINICAL DATA:  Shortness of breath, CHF, cor pulmonale nonsmoker. EXAM: CHEST - 2 VIEW COMPARISON:  PA and lateral chest x-ray of January 03, 2018 FINDINGS: The cardiac silhouette remains enlarged. The pulmonary vascularity is mildly prominent. The interstitial markings are slightly more conspicuous today. There is no significant pleural effusion though a trace of pleural fluid blunting the left posterior costophrenic angle may be present. The mediastinum is normal in width. The bony thorax is unremarkable. IMPRESSION: Findings compatible with chronic CHF. Mild interstitial edema currently. No alveolar pneumonia. Electronically Signed   By: David  Martinique M.D.   On:  02/16/2018 10:58      Medications:     Scheduled Medications: . allopurinol  100 mg Oral Daily  . amiodarone  200 mg Oral Daily  . aspirin EC  81 mg Oral Daily  . atorvastatin  80 mg Oral q1800  . furosemide  80 mg Intravenous Q8H  . hydrALAZINE  25 mg Oral Q8H  . insulin aspart  0-15 Units Subcutaneous TID WC  . insulin aspart  0-5 Units Subcutaneous QHS  . isosorbide mononitrate  30 mg Oral Daily  . metoprolol succinate  50 mg Oral Daily  . omega-3 acid ethyl esters  1 g Oral BID  . potassium chloride  40 mEq Oral Daily  . rivaroxaban  20 mg Oral Q supper  . sodium chloride flush  3 mL Intravenous Q12H     Infusions: . sodium chloride Stopped (02/16/18 1615)     PRN Medications:  sodium chloride, acetaminophen, guaiFENesin-dextromethorphan, ondansetron (ZOFRAN) IV, sodium chloride flush    Patient Profile   Jay Chen is a 52 y.o. male with acute on chronic combined CHF, presumed non-ischemic (no cath), EF 20-25%, AAA 4.7 cm by echo, hx of CVA, HTN, HLD, OSA, DM2, CKD III (Baseline Cr 1.7 - 1.9), and morbid obesity (Body mass index is 56.56 kg/m.)   Admitted from [facility] clinic 02/15/18 with acute on chronic combined CHF and marked volume overload.    Assessment/Plan   1. Acute on chronic combined CHF, presumed non-ischemic (no cath) - Echo 12/2017 EF 20-25% - Volume status still elevated on exam. - R/LHC on Monday - Continue lasix 80 mg TID.  - Increased hydralazine to 50 mg TID - Continue imdur 30 mg daily - No ARB/ARNi/spiro currently  - Consider adding spiro once diuresed a bit more   2. AKI on CKD III - Baseline Cr 1.7 - 1.9 - back to upper end of baseline with diuresis continue to monitor   3. Persistent Afib - New diagnosis 08/2017, no rhythm strategy attempted - Amio 200mg  daily  - on xarelto   4. H/o CVA - Only remaining deficit.  - On Xarelto chronically.    5. HTN - Will adjust meds in setting of treating CHF - Cr borderline for  ARB/ARNi/Spiro.    6. AAA  - 4.7 cm by echo 12/2017    7. OSA - Non-compliant with CPAP. He seems willing to try different masks than the one he tried "years" ago.    8. DM2 - cbg very stable, SSI while in house.  - Hgb A1C 7.1 01/24/18   9. Morbid obesity - Body mass index is 56.56 kg/m.  - Needs significant weight loss.   Length of Stay: 2   Vickki Muff, MD  02/17/2018, 9:09 AM  Advanced Heart Failure Team Pager [phone] (M-F; 7a - 4p)  Please contact [facility] for night-coverage after hours (4p -7a ) and weekends  on [email or web address]  Patient seen and examined with the above-signed Advanced Practice Provider and/or Housestaff. I personally reviewed laboratory data, imaging studies and relevant notes. I independently examined the patient and formulated the important aspects of the plan. I have edited the note to reflect any of my changes or salient points. I have personally discussed the plan with the patient and/or family.  Feels ok but volume status remains markedly elevated. Will continue IV diuresis. Creatinine stable. Remains in AF. May need to consider attempt at DC-CV at some point. Unable to tolerate CPAP.   Glori Bickers, MD  4:57 PM

## 2018-02-18 LAB — CBC
HEMATOCRIT: 40.5 % (ref 39.0–52.0)
HEMOGLOBIN: 12.9 g/dL — AB (ref 13.0–17.0)
MCH: 31.8 pg (ref 26.0–34.0)
MCHC: 31.9 g/dL (ref 30.0–36.0)
MCV: 99.8 fL (ref 78.0–100.0)
PLATELETS: 199 10*3/uL (ref 150–400)
RBC: 4.06 MIL/uL — ABNORMAL LOW (ref 4.22–5.81)
RDW: 16.4 % — ABNORMAL HIGH (ref 11.5–15.5)
WBC: 10.8 10*3/uL — ABNORMAL HIGH (ref 4.0–10.5)

## 2018-02-18 LAB — APTT
APTT: 64 s — AB (ref 24–36)
aPTT: 68 seconds — ABNORMAL HIGH (ref 24–36)
aPTT: 57 seconds — ABNORMAL HIGH (ref 24–36)

## 2018-02-18 LAB — BASIC METABOLIC PANEL
ANION GAP: 11 (ref 5–15)
BUN: 25 mg/dL — AB (ref 6–20)
CO2: 33 mmol/L — AB (ref 22–32)
Calcium: 8.6 mg/dL — ABNORMAL LOW (ref 8.9–10.3)
Chloride: 97 mmol/L — ABNORMAL LOW (ref 98–111)
Creatinine, Ser: 1.79 mg/dL — ABNORMAL HIGH (ref 0.61–1.24)
GFR calc Af Amer: 48 mL/min — ABNORMAL LOW (ref >60)
GFR calc non Af Amer: 41 mL/min — ABNORMAL LOW (ref >60)
GLUCOSE: 133 mg/dL — AB (ref 70–99)
POTASSIUM: 3.7 mmol/L (ref 3.5–5.1)
Sodium: 141 mmol/L (ref 135–145)

## 2018-02-18 LAB — GLUCOSE, CAPILLARY
GLUCOSE-CAPILLARY: 134 mg/dL — AB (ref 70–99)
Glucose-Capillary: 110 mg/dL — ABNORMAL HIGH (ref 70–99)
Glucose-Capillary: 126 mg/dL — ABNORMAL HIGH (ref 70–99)
Glucose-Capillary: 108 mg/dL — ABNORMAL HIGH (ref 70–99)

## 2018-02-18 LAB — HEPARIN LEVEL (UNFRACTIONATED)
Heparin Unfractionated: 1.76 IU/mL — ABNORMAL HIGH (ref 0.30–0.70)
Heparin Unfractionated: 0.71 IU/mL — ABNORMAL HIGH (ref 0.30–0.70)

## 2018-02-18 NOTE — Progress Notes (Signed)
ANTICOAGULATION CONSULT NOTE   Pharmacy Consult for Heparin Indication: atrial fibrillation  No Known Allergies  Patient Measurements: Height: 5\' 8"  (172.7 cm) Weight: (!) 363 lb 3.2 oz (164.7 kg)(scale c) IBW/kg (Calculated) : 68.4 Heparin Dosing Weight: 111kg  Vital Signs: Temp: 98.5 F (36.9 C) (09/27 2015) Temp Source: Oral (09/27 2015) BP: 136/124 (09/27 2211) Pulse Rate: 74 (09/27 2015)  Labs: Recent Labs    02/15/18 1559 02/16/18 0451 02/17/18 0452 02/18/18 0005  HGB 13.2  --   --   --   HCT 41.8  --   --   --   PLT 228  --   --   --   APTT  --   --   --  68*  HEPARINUNFRC  --   --   --  1.76*  CREATININE 2.06* 1.93* 1.94*  --     Estimated Creatinine Clearance: 65.8 mL/min (A) (by C-G formula based on SCr of 1.94 mg/dL (H)).   Medical History: Past Medical History:  Diagnosis Date  . CKD (chronic kidney disease), stage III ([place])   . Congestive heart failure [facility]) May of 2011   Felt to have cor pulmonale; EF 45 to 50% from echo in May 2011  . Cor pulmonale (chronic) ([place])   . Gout    "take daily RX" (02/15/2018)  . History of kidney stones   . Hyperlipidemia   . Hypertension   . Morbid obesity ([place])   . Persistent atrial fibrillation ([place])    [name] 12/28/2017  . Sleep apnea    "dx'd; couldn't tolerate CPAP" (02/15/2018)  . Thoracic aneurysm    a. 4.8cm thoracic aortic aneurysm by CT 2013.  [name] Thoracic aortic aneurysm ([place])    known/notes 12/28/2017  . Type II diabetes mellitus (HCC)      Assessment: 58 yoM on Xarelto PTA for hx PAF and CVA. Pt admitted with acute CHF, planning to undergo cardiac cath on Monday so will transition to IV heparin. Last dose of Xarelto was 9/26 at 1800.   9/28 AM update: aPTT is therapeutic x 1 this AM, using aPTT to dose for now given Xarelto influence on heparin level which is elevated at 1.76  Goal of Therapy:  Heparin level 0.3-0.7 units/ml aPTT 66-102 seconds Monitor platelets by anticoagulation protocol: Yes    Plan:  -Cont heparin at 1500 units/hr -Confirmatory aPTT with AM labs  Narda Bonds, PharmD, [place] Pharmacist Phone: [phone]

## 2018-02-18 NOTE — Plan of Care (Signed)
  Problem: Education: Goal: Knowledge of General Education information will improve Description: Including pain rating scale, medication(s)/side effects and non-pharmacologic comfort measures Outcome: Progressing   Problem: Clinical Measurements: Goal: Ability to maintain clinical measurements within normal limits will improve Outcome: Progressing   Problem: Clinical Measurements: Goal: Will remain free from infection Outcome: Progressing   Problem: Clinical Measurements: Goal: Respiratory complications will improve Outcome: Progressing   Problem: Clinical Measurements: Goal: Cardiovascular complication will be avoided Outcome: Progressing   

## 2018-02-18 NOTE — Progress Notes (Signed)
ANTICOAGULATION CONSULT NOTE   Pharmacy Consult for Heparin Indication: atrial fibrillation  No Known Allergies  Patient Measurements: Height: 5\' 8"  (172.7 cm) Weight: (!) 352 lb 6.4 oz (159.8 kg) IBW/kg (Calculated) : 68.4 HEPARIN DW (KG): 111.6   Vital Signs: Temp: 98.2 F (36.8 C) (09/28 0517) Temp Source: Oral (09/28 0517) BP: 116/91 (09/28 0517) Pulse Rate: 104 (09/28 0517)  Labs: Recent Labs    02/15/18 1559 02/16/18 0451 02/17/18 0452 02/18/18 0005 02/18/18 0550  HGB 13.2  --   --   --  12.9*  HCT 41.8  --   --   --  40.5  PLT 228  --   --   --  199  APTT  --   --   --  68* 64*  HEPARINUNFRC  --   --   --  1.76*  --   CREATININE 2.06* 1.93* 1.94*  --  1.79*    Estimated Creatinine Clearance: 70.1 mL/min (A) (by C-G formula based on SCr of 1.79 mg/dL (H)).   Medical History: Past Medical History:  Diagnosis Date  . CKD (chronic kidney disease), stage III ([place])   . Congestive heart failure [facility]) May of 2011   Felt to have cor pulmonale; EF 45 to 50% from echo in May 2011  . Cor pulmonale (chronic) ([place])   . Gout    "take daily RX" (02/15/2018)  . History of kidney stones   . Hyperlipidemia   . Hypertension   . Morbid obesity ([place])   . Persistent atrial fibrillation ([place])    [name] 12/28/2017  . Sleep apnea    "dx'd; couldn't tolerate CPAP" (02/15/2018)  . Thoracic aneurysm    a. 4.8cm thoracic aortic aneurysm by CT 2013.  [name] Thoracic aortic aneurysm ([place])    known/notes 12/28/2017  . Type II diabetes mellitus (HCC)     Assessment: 89 yoM on Xarelto PTA for hx PAF and CVA. Pt admitted with acute CHF, planning to undergo cardiac cath on Monday 9/30 so will transition to IV heparin. Last dose of Xarelto was 9/26 at 1800.   Confirmatory aPTT level slightly subtherapeutic this morning at 64 (using aPTT to titrate heparin dose given Xarelto influence on heparin levels). CBC relatively stable with Hgb at 12.9 and platelets 199. No reported bleeding per  patient; no issues with the heparin infusion overnight per RN. Will conservatively increase heparin infusion rate and recheck aPTT and HL this afternoon.  Goal of Therapy:  Heparin level 0.3-0.7 units/ml aPTT 66-102 seconds Monitor platelets by anticoagulation protocol: Yes   Plan:  - Increase heparin to 1550 units/hr - 6h aPTT and HL - daily HL, CBC while on heparin - monitor for s/sx bleeding   Brendolyn Patty, PharmD PGY1 Pharmacy Resident Phone [phone]  02/18/2018   8:10 AM

## 2018-02-18 NOTE — Progress Notes (Signed)
Advanced Heart Failure Rounding Note  PCP:  Primary Cardiologist:   Subjective:   Echo 12/2017 EF 20-25%  Feels ok. Diuresing well. Still with mild orthopnea. No CP. Unable to tolerate CPAP.   Weight down 11 pounds overnight. Now down 28 pounds total. Creatinine improved 1.9 -> 1.8  Objective:   Weight Range: (!) 159.8 kg Body mass index is 53.58 kg/m.   Vital Signs:   Temp:  [98.2 F (36.8 C)-99.2 F (37.3 C)] 98.2 F (36.8 C) (09/28 0517) Pulse Rate:  [74-104] 104 (09/28 0517) Resp:  [18] 18 (09/28 0517) BP: (116-162)/(77-124) 116/91 (09/28 0517) SpO2:  [90 %-97 %] 90 % (09/28 0517) Weight:  [159.8 kg] 159.8 kg (09/28 0517) Last BM Date: 02/17/18  Weight change: Filed Weights   02/16/18 0517 02/17/18 0700 02/18/18 0517  Weight: (!) 168.7 kg (!) 164.7 kg (!) 159.8 kg    Intake/Output:   Intake/Output Summary (Last 24 hours) at 02/18/2018 0735 Last data filed at 02/18/2018 0657 Gross per 24 hour  Intake 897.32 ml  Output 5725 ml  Net -4827.68 ml      Physical Exam    General:  Obese male lying in bed No resp difficulty HEENT: normal Neck: supple. Unable to assess Carotids 2+ bilat; no bruits. No lymphadenopathy or thryomegaly appreciated. Cor: PMI nondisplaced. Regular rate & rhythm. No rubs, gallops or murmurs. Lungs: clear Abdomen: soft, nontender, nondistended. No hepatosplenomegaly. No bruits or masses. Good bowel sounds. Extremities: no cyanosis, clubbing, rash, 2+ edema Neuro: alert & orientedx3, cranial nerves grossly intact. moves all 4 extremities w/o difficulty. Affect pleasant   Telemetry   AF rates 90-105  Personally reviewed   EKG    None this am  Labs    CBC Recent Labs    02/15/18 1559 02/18/18 0550  WBC 5.9 10.8*  NEUTROABS 4.0  --   HGB 13.2 12.9*  HCT 41.8 40.5  MCV 100.7* 99.8  PLT 228 509   Basic Metabolic Panel Recent Labs    02/15/18 1559  02/17/18 0452 02/18/18 0550  NA 143   < > 142 141  K 3.8   < >  3.6 3.7  CL 104   < > 98 97*  CO2 30   < > 34* 33*  GLUCOSE 114*   < > 125* 133*  BUN 32*   < > 30* 25*  CREATININE 2.06*   < > 1.94* 1.79*  CALCIUM 8.8*   < > 8.5* 8.6*  MG 1.6*  --  1.8  --    < > = values in this interval not displayed.   Liver Function Tests Recent Labs    02/15/18 1559  AST 44*  ALT 80*  ALKPHOS 107  BILITOT 1.2  PROT 6.3*  ALBUMIN 2.9*   No results for input(s): LIPASE, AMYLASE in the last 72 hours. Cardiac Enzymes No results for input(s): CKTOTAL, CKMB, CKMBINDEX, TROPONINI in the last 72 hours.  BNP: BNP (last 3 results) Recent Labs    12/27/17 2354 02/15/18 1559  BNP 283.3* 847.1*    ProBNP (last 3 results) No results for input(s): PROBNP in the last 8760 hours.   D-Dimer No results for input(s): DDIMER in the last 72 hours. Hemoglobin A1C No results for input(s): HGBA1C in the last 72 hours. Fasting Lipid Panel No results for input(s): CHOL, HDL, LDLCALC, TRIG, CHOLHDL, LDLDIRECT in the last 72 hours. Thyroid Function Tests No results for input(s): TSH, T4TOTAL, T3FREE, THYROIDAB in the last 72 hours.  Invalid input(s): FREET3  Other results:   Imaging    No results found.   Medications:     Scheduled Medications: . allopurinol  100 mg Oral Daily  . amiodarone  200 mg Oral Daily  . aspirin EC  81 mg Oral Daily  . atorvastatin  80 mg Oral q1800  . furosemide  80 mg Intravenous Q8H  . hydrALAZINE  50 mg Oral Q8H  . insulin aspart  0-15 Units Subcutaneous TID WC  . insulin aspart  0-5 Units Subcutaneous QHS  . isosorbide mononitrate  30 mg Oral Daily  . metoprolol succinate  50 mg Oral Daily  . omega-3 acid ethyl esters  1 g Oral BID  . potassium chloride  40 mEq Oral Daily  . sodium chloride flush  3 mL Intravenous Q12H    Infusions: . sodium chloride Stopped (02/16/18 1615)  . heparin 1,500 Units/hr (02/18/18 0535)    PRN Medications: sodium chloride, acetaminophen, guaiFENesin-dextromethorphan, ondansetron  (ZOFRAN) IV, sodium chloride flush    Patient Profile   Jay Chen is a 52 y.o. male with acute on chronic combined CHF, presumed non-ischemic (no cath), EF 20-25%, AAA 4.7 cm by echo, hx of CVA, HTN, HLD, OSA, DM2, CKD III (Baseline Cr 1.7 - 1.9), and morbid obesity (Body mass index is 56.56 kg/m.)   Admitted from [facility] clinic 02/15/18 with acute on chronic combined CHF and marked volume overload.    Assessment/Plan   1. Acute on chronic combined CHF, presumed non-ischemic (no cath) - Echo 12/2017 EF 20-25% - Volume status much improved. Weight down 28 pounds. Will continue diuresis one more day - R/LHC on Monday - Continue lasix 80 mg TID.  - Continue Toprol 50 daily - Continue hydralazine to 50 mg TID - Continue imdur 30 mg daily - No ARB/ARNi/spiro currently with CKD 3 - Consider adding spiro/Entresto after cath   2. AKI on CKD III - Baseline Cr 1.7 - 1.9 - Creatinine 1.79 today. Continue to follow closely with diuresis   3. Persistent Afib - New diagnosis 08/2017, Currently on rhythm control strategy. May be worth attempt at [facility] prior to d/c - Has been on Amio 200mg  daily and Toprol 50 daily. Rate slightly elevated - Xarelto on hold for cath. IV heparin started. Discussed dosing with PharmD personally.   4. H/o CVA - Only remaining deficit.  - On Xarelto chronically.    5. HTN - Will adjust meds in setting of treating CHF - Cr borderline for ARB/ARNi/Spiro.    6. AAA  - 4.7 cm by echo 12/2017    7. OSA - Non-compliant with CPAP. He seems willing to try different masks than the one he tried "years" ago.    8. DM2 - cbg very stable, SSI while in house.  - Hgb A1C 7.1 01/24/18   9. Morbid obesity - Body mass index is 56.56 kg/m.  - Needs significant weight loss.  - Nutrition consult ordered  Length of Stay: 3   Glori Bickers, MD  02/18/2018, 7:35 AM  Advanced Heart Failure Team Pager [phone] (M-F; 7a - 4p)  Please contact [facility] for  night-coverage after hours (4p -7a ) and weekends on [email or web address]

## 2018-02-18 NOTE — Progress Notes (Signed)
ANTICOAGULATION CONSULT NOTE   Pharmacy Consult for Heparin Indication: atrial fibrillation  No Known Allergies  Patient Measurements: Height: 5\' 8"  (172.7 cm) Weight: (!) 352 lb 6.4 oz (159.8 kg) IBW/kg (Calculated) : 68.4 HEPARIN DW (KG): 111.6   Vital Signs: Temp: 99.1 F (37.3 C) (09/28 1225) Temp Source: Oral (09/28 1225) BP: 116/78 (09/28 1225) Pulse Rate: 87 (09/28 1225)  Labs: Recent Labs    02/15/18 1559 02/16/18 0451 02/17/18 0452 02/18/18 0005 02/18/18 0550 02/18/18 1429  HGB 13.2  --   --   --  12.9*  --   HCT 41.8  --   --   --  40.5  --   PLT 228  --   --   --  199  --   APTT  --   --   --  68* 64* 57*  HEPARINUNFRC  --   --   --  1.76*  --  0.71*  CREATININE 2.06* 1.93* 1.94*  --  1.79*  --     Estimated Creatinine Clearance: 70.1 mL/min (A) (by C-G formula based on SCr of 1.79 mg/dL (H)).   Medical History: Past Medical History:  Diagnosis Date  . CKD (chronic kidney disease), stage III ([place])   . Congestive heart failure [facility]) May of 2011   Felt to have cor pulmonale; EF 45 to 50% from echo in May 2011  . Cor pulmonale (chronic) ([place])   . Gout    "take daily RX" (02/15/2018)  . History of kidney stones   . Hyperlipidemia   . Hypertension   . Morbid obesity ([place])   . Persistent atrial fibrillation ([place])    [name] 12/28/2017  . Sleep apnea    "dx'd; couldn't tolerate CPAP" (02/15/2018)  . Thoracic aneurysm    a. 4.8cm thoracic aortic aneurysm by CT 2013.  [name] Thoracic aortic aneurysm ([place])    known/notes 12/28/2017  . Type II diabetes mellitus (HCC)     Assessment: 65 yoM on Xarelto PTA for hx PAF and CVA. Pt admitted with acute CHF, planning to undergo cardiac cath on Monday 9/30 so will transition to IV heparin. Last dose of Xarelto was 9/26 at 1800.  -HL= 0.71 (down from 1.76), aPTT= 57)   Goal of Therapy:  Heparin level 0.3-0.7 units/ml aPTT 66-102 seconds Monitor platelets by anticoagulation protocol: Yes   Plan:  - Increase  heparin to 1700 units/hr - daily HL, CBC while on heparin  Hildred Laser, PharmD Clinical Pharmacist Please check Amion for pharmacy contact number

## 2018-02-18 NOTE — Plan of Care (Addendum)
Nutrition Education Note  RD consulted for nutrition education regarding morbid obesity and need for significant weight loss. RD focused education on CHF and diabetes, per pt request and due to rationale for pt being in the hospital.   Lab Results  Component Value Date   HGBA1C 7.1 (A) 01/24/2018   PTA DM medications are none.   Labs reviewed: 110-126 (inpatient orders for glycemic control are 0-15 units insulin aspart TID and 0-5 units insulin aspart q HS).    Spoke with pt at bedside. Pt son also in room at time of visit, but did not participate in conversation. Pt reports good appetite at home. He shares he had a recent hospitalization for heart failure approximately a month ago and has been working on significantly modifying his diet. Pt reports prior to last hospitalization, he ate a lot of fast food. He shares that he still eats out, but now chooses to dine at local diners and cafeterias like [employer]. He does most of the cooking at home, but reported concern when family members are cooking for him- he shares with this RD that he believes he may have been readmitted due to hidden sources of sodium in his diet.   Pt now consumes 3 meals per day (Breakfast: honey nut cheerios and fruit, Lunch: sandwich; Dinner: meat, starch, and vegetable). Per pt, vegetables are either frozen or rinsed and drained prior to boiling in fresh water. He seasons his food with a lot oregano and pepper. RD reviewed diet recall and discussed ways that pt could further decrease sugar, fat, and sodium in his diet. Also discussed in detail healthier choices while going out to eat.   Pt reports good glycemic control; CBGS usually run in the 150's and below.   Weight loss is not an ideal goal for acute hospitalization, other than what is achieved through diuresis to help manage fluid overload. Pt continues to require an intensive weight loss program as an outpatient, recommend outpatient referral to bariatrics and/or [facility]'s Nutrition and Diabetes Education Services.   Calorie count not needed at this time, however, RD did provide double protein portions at each meal to help pt preserve lean body mass during hospital stay.   Nutrition-Focused physical exam completed. Findings are no fat depletion, no muscle depletion, and moderate edema.   RD provided "Heart Healthy, Consistent Carbohydrate Nutrition Therapy" handout from the Academy of [employer] and Dietetics. Reviewed patient's dietary recall. Provided examples on ways to decrease sodium intake in diet. Discouraged intake of processed foods and use of salt shaker. Encouraged fresh fruits and vegetables as well as whole grain sources of carbohydrates to maximize fiber intake.   RD discussed why it is important for patient to adhere to diet recommendations, and emphasized the role of fluids, foods to avoid, and importance of weighing self daily.   Discussed different food groups and their effects on blood sugar, emphasizing carbohydrate-containing foods. Provided list of carbohydrates and recommended serving sizes of common foods.  Discussed importance of controlled and consistent carbohydrate intake throughout the day. Provided examples of ways to balance meals/snacks and encouraged intake of high-fiber, whole grain complex carbohydrates. Teach back method used.  Expect fair to good compliance.  Body mass index is 53.58 kg/m. Pt meets criteria for extreme obesity, class III based on current BMI.  Current diet order is renal/ carb modified with 1200 ml fluid restriction, patient is consuming approximately 45-100% of meals at this time. Labs and medications reviewed. No further nutrition interventions warranted at  this time. RD contact information provided. If additional nutrition issues arise, please re-consult RD.   Zvi Duplantis A. Jimmye Norman, RD, LDN, CDE Pager: [phone] After hours Pager: [phone]

## 2018-02-19 LAB — BASIC METABOLIC PANEL
ANION GAP: 13 (ref 5–15)
BUN: 26 mg/dL — ABNORMAL HIGH (ref 6–20)
CALCIUM: 8.5 mg/dL — AB (ref 8.9–10.3)
CO2: 34 mmol/L — ABNORMAL HIGH (ref 22–32)
CREATININE: 1.65 mg/dL — AB (ref 0.61–1.24)
Chloride: 92 mmol/L — ABNORMAL LOW (ref 98–111)
GFR calc Af Amer: 53 mL/min — ABNORMAL LOW (ref >60)
GFR, EST NON AFRICAN AMERICAN: 46 mL/min — AB (ref >60)
Glucose, Bld: 106 mg/dL — ABNORMAL HIGH (ref 70–99)
Potassium: 2.9 mmol/L — ABNORMAL LOW (ref 3.5–5.1)
Sodium: 139 mmol/L (ref 135–145)

## 2018-02-19 LAB — CBC
HCT: 40.2 % (ref 39.0–52.0)
Hemoglobin: 12.9 g/dL — ABNORMAL LOW (ref 13.0–17.0)
MCH: 31.7 pg (ref 26.0–34.0)
MCHC: 32.1 g/dL (ref 30.0–36.0)
MCV: 98.8 fL (ref 78.0–100.0)
PLATELETS: 195 10*3/uL (ref 150–400)
RBC: 4.07 MIL/uL — ABNORMAL LOW (ref 4.22–5.81)
RDW: 16.3 % — AB (ref 11.5–15.5)
WBC: 10 10*3/uL (ref 4.0–10.5)

## 2018-02-19 LAB — HEPARIN LEVEL (UNFRACTIONATED)
HEPARIN UNFRACTIONATED: 0.35 [IU]/mL (ref 0.30–0.70)
Heparin Unfractionated: 0.33 IU/mL (ref 0.30–0.70)

## 2018-02-19 LAB — GLUCOSE, CAPILLARY
GLUCOSE-CAPILLARY: 113 mg/dL — AB (ref 70–99)
Glucose-Capillary: 108 mg/dL — ABNORMAL HIGH (ref 70–99)
Glucose-Capillary: 108 mg/dL — ABNORMAL HIGH (ref 70–99)
Glucose-Capillary: 150 mg/dL — ABNORMAL HIGH (ref 70–99)
Glucose-Capillary: 137 mg/dL — ABNORMAL HIGH (ref 70–99)

## 2018-02-19 LAB — APTT: APTT: 56 s — AB (ref 24–36)

## 2018-02-19 MED ORDER — METOPROLOL SUCCINATE ER 50 MG PO TB24
75.0000 mg | ORAL_TABLET | Freq: Every day | ORAL | Status: DC
  Administered 2018-02-20 – 2018-02-23 (×4): 75 mg via ORAL
  Filled 2018-02-19 (×4): qty 1

## 2018-02-19 MED ORDER — POTASSIUM CHLORIDE CRYS ER 20 MEQ PO TBCR
40.0000 meq | EXTENDED_RELEASE_TABLET | Freq: Two times a day (BID) | ORAL | Status: AC
  Administered 2018-02-19 (×2): 40 meq via ORAL
  Filled 2018-02-19 (×2): qty 2

## 2018-02-19 NOTE — Progress Notes (Signed)
ANTICOAGULATION CONSULT NOTE   Pharmacy Consult for Heparin Indication: atrial fibrillation  No Known Allergies  Patient Measurements: Height: 5\' 8"  (172.7 cm) Weight: (!) 344 lb (156 kg) IBW/kg (Calculated) : 68.4 HEPARIN DW (KG): 111.6   Vital Signs: Temp: 98.9 F (37.2 C) (09/29 0544) Temp Source: Oral (09/29 0544) BP: 148/107 (09/29 0544) Pulse Rate: 108 (09/29 0544)  Labs: Recent Labs    02/17/18 0452  02/18/18 0005 02/18/18 0550 02/18/18 1429 02/19/18 0447  HGB  --   --   --  12.9*  --  12.9*  HCT  --   --   --  40.5  --  40.2  PLT  --   --   --  199  --  195  APTT  --    < > 68* 64* 57* 56*  HEPARINUNFRC  --   --  1.76*  --  0.71* 0.35  CREATININE 1.94*  --   --  1.79*  --  1.65*   < > = values in this interval not displayed.    Estimated Creatinine Clearance: 74.9 mL/min (A) (by C-G formula based on SCr of 1.65 mg/dL (H)).   Medical History: Past Medical History:  Diagnosis Date  . CKD (chronic kidney disease), stage III ([place])   . Congestive heart failure [facility]) May of 2011   Felt to have cor pulmonale; EF 45 to 50% from echo in May 2011  . Cor pulmonale (chronic) ([place])   . Gout    "take daily RX" (02/15/2018)  . History of kidney stones   . Hyperlipidemia   . Hypertension   . Morbid obesity ([place])   . Persistent atrial fibrillation ([place])    [name] 12/28/2017  . Sleep apnea    "dx'd; couldn't tolerate CPAP" (02/15/2018)  . Thoracic aneurysm    a. 4.8cm thoracic aortic aneurysm by CT 2013.  [name] Thoracic aortic aneurysm ([place])    known/notes 12/28/2017  . Type II diabetes mellitus (HCC)     Assessment: 63 yoM on Xarelto PTA for hx PAF and CVA. Pt admitted with acute CHF, planning to undergo cardiac cath on Monday 9/30 so pharmacy has been consulted to transition to IV heparin. Last dose of Xarelto was 9/26 at 1800.   Heparin level therapeutic at 0.35 this morning and correlated with aPTT at 56 seconds. Will transition to using HL to titrate heparin dose  as Xarelto influence on heparin levels has now diminished. CBC stable, no documented bleeding nor issues with the heparin infusion overnight.  Goal of Therapy:  Heparin level 0.3-0.7 units/ml Monitor platelets by anticoagulation protocol: Yes   Plan:  - Continue heparin at 1700 units/hr - 6h confirmatory HL - daily HL, CBC while on heparin - monitor for s/sx bleeding   Brendolyn Patty, PharmD PGY1 Pharmacy Resident Phone [phone]  02/19/2018   7:45 AM

## 2018-02-19 NOTE — Progress Notes (Signed)
Progress Note  Patient Name: Jay Chen Date of Encounter: 02/19/2018  Primary Cardiologist: Quay Burow, MD and Dr [name]  Subjective   No CP or dyspnea  Inpatient Medications    Scheduled Meds: . allopurinol  100 mg Oral Daily  . amiodarone  200 mg Oral Daily  . aspirin EC  81 mg Oral Daily  . atorvastatin  80 mg Oral q1800  . furosemide  80 mg Intravenous Q8H  . hydrALAZINE  50 mg Oral Q8H  . insulin aspart  0-15 Units Subcutaneous TID WC  . insulin aspart  0-5 Units Subcutaneous QHS  . isosorbide mononitrate  30 mg Oral Daily  . metoprolol succinate  50 mg Oral Daily  . omega-3 acid ethyl esters  1 g Oral BID  . potassium chloride  40 mEq Oral Daily  . sodium chloride flush  3 mL Intravenous Q12H   Continuous Infusions: . sodium chloride Stopped (02/16/18 1615)  . heparin 1,700 Units/hr (02/19/18 1017)   PRN Meds: sodium chloride, acetaminophen, guaiFENesin-dextromethorphan, ondansetron (ZOFRAN) IV, sodium chloride flush   Vital Signs    Vitals:   02/18/18 1225 02/18/18 2213 02/19/18 0544 02/19/18 0700  BP: 116/78 (!) 141/107 (!) 148/107   Pulse: 87 81 (!) 108   Resp: 20 (!) 24 20   Temp: 99.1 F (37.3 C) 99.1 F (37.3 C) 98.9 F (37.2 C)   TempSrc: Oral Oral Oral   SpO2: 91% 94% 94%   Weight:    (!) 156 kg  Height:        Intake/Output Summary (Last 24 hours) at 02/19/2018 1052 Last data filed at 02/19/2018 0823 Gross per 24 hour  Intake 1735.5 ml  Output 3125 ml  Net -1389.5 ml   Filed Weights   02/17/18 0700 02/18/18 0517 02/19/18 0700  Weight: (!) 164.7 kg (!) 159.8 kg (!) 156 kg    Telemetry    Atrial fibrillation with PVCs or aberrantly conducted beats, rate mildly elevated.- Personally Reviewed  Physical Exam   GEN: No acute distress.  Morbidly obese Neck: supple Cardiac: irregular and tachycardic Respiratory: CTA anteriorly  GI: Soft, nontender, non-distended; positive presacral edema MS: 1+ edema Neuro:  Nonfocal    Psych: Normal affect   Labs    Chemistry Recent Labs  Lab 02/15/18 1559  02/17/18 0452 02/18/18 0550 02/19/18 0447  NA 143   < > 142 141 139  K 3.8   < > 3.6 3.7 2.9*  CL 104   < > 98 97* 92*  CO2 30   < > 34* 33* 34*  GLUCOSE 114*   < > 125* 133* 106*  BUN 32*   < > 30* 25* 26*  CREATININE 2.06*   < > 1.94* 1.79* 1.65*  CALCIUM 8.8*   < > 8.5* 8.6* 8.5*  PROT 6.3*  --   --   --   --   ALBUMIN 2.9*  --   --   --   --   AST 44*  --   --   --   --   ALT 80*  --   --   --   --   ALKPHOS 107  --   --   --   --   BILITOT 1.2  --   --   --   --   GFRNONAA 35*   < > 37* 41* 46*  GFRAA 40*   < > 43* 48* 53*  ANIONGAP 9   < > 10  11 13   < > = values in this interval not displayed.     Hematology Recent Labs  Lab 02/15/18 1559 02/18/18 0550 02/19/18 0447  WBC 5.9 10.8* 10.0  RBC 4.15* 4.06* 4.07*  HGB 13.2 12.9* 12.9*  HCT 41.8 40.5 40.2  MCV 100.7* 99.8 98.8  MCH 31.8 31.8 31.7  MCHC 31.6 31.9 32.1  RDW 16.8* 16.4* 16.3*  PLT 228 199 195     Patient Profile     Lewi E Burnetteis a 52 y.o.malewith acute on chronic combined CHF,presumed non-ischemic (no cath),EF 20-25%, AAA 4.7 cm by echo, hx of CVA, HTN, HLD, OSA, DM2, CKD III (Baseline Cr 1.7 - 1.9),and morbid obesity (Body mass index is 56.56 kg/m.)  Admitted from [facility] clinic 02/15/18 with acute on chronic combined CHF and marked volume overload.  Assessment & Plan    1 acute on chronic combined systolic/diastolic congestive heart failure-I/O-1020. Weight 344 lbs. volume status continues to improve.  Continue present dose of Lasix.  Hold tomorrow morning prior to catheterization.  Note most recent echocardiogram August 2019 showed ejection fraction 20 to 25%.  Plan is to proceed with right and left cardiac catheterization tomorrow.  2 presumed nonischemic cardiomyopathy-plan right and left cardiac catheterization tomorrow.  Hold diuretics prior to procedure.  Continue beta-blocker, hydralazine and  nitrates.  ACE inhibitor, ARB, Entresto, Spironolactone on hold given renal insufficiency.  Can consider following catheterization once renal function stable.  3 persistent atrial fibrillation-heart rate mildly elevated.  Increase Toprol to 75 mg daily.  Continue amiodarone.  Resume Xarelto following cardiac catheterization.  Continue IV heparin for now.  4 chronic stage III kidney disease-renal function stable this morning.  Will recheck tomorrow prior to catheterization.  Limit dye and no ventriculogram.  Follow renal function after procedure.  5 history of abdominal aortic aneurysm  6 morbid obesity-needs weight loss.  7 hypertension-follow blood pressure and add Entresto following procedure if renal function stable.  8 hypokalemia-supplement  For questions or updates, please contact [place] Please consult [email or web address] for contact info under        Signed, Kirk Ruths, MD  02/19/2018, 10:52 AM

## 2018-02-19 NOTE — Progress Notes (Signed)
Patient resting comfortably during shift report. Denies complaints.  

## 2018-02-19 NOTE — Plan of Care (Signed)
  Problem: Education: ?Goal: Knowledge of General Education information will improve ?Description: Including pain rating scale, medication(s)/side effects and non-pharmacologic comfort measures ?Outcome: Progressing ?  ?Problem: Health Behavior/Discharge Planning: ?Goal: Ability to manage health-related needs will improve ?Outcome: Progressing ?  ?Problem: Clinical Measurements: ?Goal: Will remain free from infection ?Outcome: Progressing ?  ?Problem: Clinical Measurements: ?Goal: Diagnostic test results will improve ?Outcome: Progressing ?  ?Problem: Clinical Measurements: ?Goal: Cardiovascular complication will be avoided ?Outcome: Progressing ?  ?

## 2018-02-19 NOTE — Progress Notes (Signed)
ANTICOAGULATION CONSULT NOTE   Pharmacy Consult for Heparin Indication: atrial fibrillation  No Known Allergies  Patient Measurements: Height: 5\' 8"  (172.7 cm) Weight: (!) 344 lb (156 kg) IBW/kg (Calculated) : 68.4 HEPARIN DW (KG): 111.6   Vital Signs: Temp: 98.9 F (37.2 C) (09/29 0544) Temp Source: Oral (09/29 0544) BP: 142/92 (09/29 1106) Pulse Rate: 97 (09/29 1106)  Labs: Recent Labs    02/17/18 0452  02/18/18 0005 02/18/18 0550 02/18/18 1429 02/19/18 0447  HGB  --   --   --  12.9*  --  12.9*  HCT  --   --   --  40.5  --  40.2  PLT  --   --   --  199  --  195  APTT  --    < > 68* 64* 57* 56*  HEPARINUNFRC  --   --  1.76*  --  0.71* 0.35  CREATININE 1.94*  --   --  1.79*  --  1.65*   < > = values in this interval not displayed.    Estimated Creatinine Clearance: 74.9 mL/min (A) (by C-G formula based on SCr of 1.65 mg/dL (H)).   Medical History: Past Medical History:  Diagnosis Date  . CKD (chronic kidney disease), stage III ([place])   . Congestive heart failure [facility]) May of 2011   Felt to have cor pulmonale; EF 45 to 50% from echo in May 2011  . Cor pulmonale (chronic) ([place])   . Gout    "take daily RX" (02/15/2018)  . History of kidney stones   . Hyperlipidemia   . Hypertension   . Morbid obesity ([place])   . Persistent atrial fibrillation ([place])    [name] 12/28/2017  . Sleep apnea    "dx'd; couldn't tolerate CPAP" (02/15/2018)  . Thoracic aneurysm    a. 4.8cm thoracic aortic aneurysm by CT 2013.  [name] Thoracic aortic aneurysm ([place])    known/notes 12/28/2017  . Type II diabetes mellitus (HCC)     Assessment: 31 yoM on Xarelto PTA for hx PAF and CVA. Pt admitted with acute CHF, planning to undergo cardiac cath on Monday 9/30 so pharmacy has been consulted to transition to IV heparin. Last dose of Xarelto was 9/26 at 1800.   Confirmatory heparin level therapeutic at 0.33 this afternoon. CBC stable, no documented bleeding nor issues with the heparin infusion today  per RN.   Goal of Therapy:  Heparin level 0.3-0.7 units/ml Monitor platelets by anticoagulation protocol: Yes   Plan:  - Continue heparin at 1700 units/hr - daily HL, CBC while on heparin - monitor for s/sx bleeding - plans for cardiac cath 9/30   Brendolyn Patty, PharmD PGY1 Pharmacy Resident Phone [phone]  02/19/2018   2:07 PM

## 2018-02-20 ENCOUNTER — Encounter (HOSPITAL_COMMUNITY)
Admission: AD | Disposition: A | Discharge status: Rehabilitation Facility | Payer: Self-pay | Source: Ambulatory Visit | Attending: Internal Medicine

## 2018-02-20 DIAGNOSIS — I482 Chronic atrial fibrillation: Secondary | ICD-10-CM

## 2018-02-20 DIAGNOSIS — I5043 Acute on chronic combined systolic (congestive) and diastolic (congestive) heart failure: Secondary | ICD-10-CM

## 2018-02-20 HISTORY — PX: RIGHT/LEFT HEART CATH AND CORONARY ANGIOGRAPHY: CATH118266

## 2018-02-20 LAB — BASIC METABOLIC PANEL
Anion gap: 11 (ref 5–15)
BUN: 30 mg/dL — ABNORMAL HIGH (ref 6–20)
CHLORIDE: 91 mmol/L — AB (ref 98–111)
CO2: 38 mmol/L — ABNORMAL HIGH (ref 22–32)
Calcium: 8.8 mg/dL — ABNORMAL LOW (ref 8.9–10.3)
Creatinine, Ser: 1.85 mg/dL — ABNORMAL HIGH (ref 0.61–1.24)
GFR, EST AFRICAN AMERICAN: 46 mL/min — AB (ref >60)
GFR, EST NON AFRICAN AMERICAN: 40 mL/min — AB (ref >60)
Glucose, Bld: 140 mg/dL — ABNORMAL HIGH (ref 70–99)
POTASSIUM: 3.1 mmol/L — AB (ref 3.5–5.1)
Sodium: 140 mmol/L (ref 135–145)

## 2018-02-20 LAB — CBC
HEMATOCRIT: 41.8 % (ref 39.0–52.0)
Hemoglobin: 13.3 g/dL (ref 13.0–17.0)
MCH: 32.1 pg (ref 26.0–34.0)
MCHC: 31.8 g/dL (ref 30.0–36.0)
MCV: 101 fL — AB (ref 78.0–100.0)
Platelets: 233 10*3/uL (ref 150–400)
RBC: 4.14 MIL/uL — AB (ref 4.22–5.81)
RDW: 16.5 % — ABNORMAL HIGH (ref 11.5–15.5)
WBC: 10.1 10*3/uL (ref 4.0–10.5)

## 2018-02-20 LAB — POCT I-STAT 3, VENOUS BLOOD GAS (G3P V)
Acid-Base Excess: 10 mmol/L — ABNORMAL HIGH (ref 0.0–2.0)
Acid-Base Excess: 13 mmol/L — ABNORMAL HIGH (ref 0.0–2.0)
BICARBONATE: 36.3 mmol/L — AB (ref 20.0–28.0)
BICARBONATE: 39 mmol/L — AB (ref 20.0–28.0)
O2 SAT: 65 %
O2 Saturation: 66 %
PCO2 VEN: 54 mmHg (ref 44.0–60.0)
PO2 VEN: 32 mmHg (ref 32.0–45.0)
PO2 VEN: 33 mmHg (ref 32.0–45.0)
TCO2: 38 mmol/L — AB (ref 22–32)
TCO2: 41 mmol/L — AB (ref 22–32)
pCO2, Ven: 52.3 mmHg (ref 44.0–60.0)
pH, Ven: 7.449 — ABNORMAL HIGH (ref 7.250–7.430)
pH, Ven: 7.467 — ABNORMAL HIGH (ref 7.250–7.430)

## 2018-02-20 LAB — POCT I-STAT 3, ART BLOOD GAS (G3+)
ACID-BASE EXCESS: 13 mmol/L — AB (ref 0.0–2.0)
Bicarbonate: 38.7 mmol/L — ABNORMAL HIGH (ref 20.0–28.0)
O2 Saturation: 92 %
PO2 ART: 59 mmHg — AB (ref 83.0–108.0)
TCO2: 40 mmol/L — ABNORMAL HIGH (ref 22–32)
pCO2 arterial: 50.9 mmHg — ABNORMAL HIGH (ref 32.0–48.0)
pH, Arterial: 7.489 — ABNORMAL HIGH (ref 7.350–7.450)

## 2018-02-20 LAB — HEPARIN LEVEL (UNFRACTIONATED): Heparin Unfractionated: 0.24 IU/mL — ABNORMAL LOW (ref 0.30–0.70)

## 2018-02-20 LAB — GLUCOSE, CAPILLARY
GLUCOSE-CAPILLARY: 135 mg/dL — AB (ref 70–99)
Glucose-Capillary: 119 mg/dL — ABNORMAL HIGH (ref 70–99)
Glucose-Capillary: 92 mg/dL (ref 70–99)
Glucose-Capillary: 142 mg/dL — ABNORMAL HIGH (ref 70–99)

## 2018-02-20 SURGERY — RIGHT/LEFT HEART CATH AND CORONARY ANGIOGRAPHY
Anesthesia: LOCAL

## 2018-02-20 MED ORDER — ONDANSETRON HCL 4 MG/2ML IJ SOLN: 4.0000 mg | Freq: Four times a day (QID) | INTRAMUSCULAR | Status: DC | PRN

## 2018-02-20 MED ORDER — SODIUM CHLORIDE 0.9 % IV SOLN: 250.0000 mL | INTRAVENOUS | Status: DC | PRN

## 2018-02-20 MED ORDER — LIDOCAINE HCL (PF) 1 % IJ SOLN
INTRAMUSCULAR | Status: DC | PRN
  Administered 2018-02-20: 3 mL via INTRADERMAL
  Administered 2018-02-20: 2 mL via INTRADERMAL

## 2018-02-20 MED ORDER — ASPIRIN 81 MG PO CHEW: 81.0000 mg | CHEWABLE_TABLET | ORAL | Status: DC

## 2018-02-20 MED ORDER — HEPARIN SODIUM (PORCINE) 1000 UNIT/ML IJ SOLN
INTRAMUSCULAR | Status: DC | PRN
  Administered 2018-02-20: 6000 [IU] via INTRAVENOUS

## 2018-02-20 MED ORDER — HEPARIN (PORCINE) IN NACL 1000-0.9 UT/500ML-% IV SOLN
INTRAVENOUS | Status: AC
  Filled 2018-02-20: qty 1000

## 2018-02-20 MED ORDER — SODIUM CHLORIDE 0.9 % IV SOLN
INTRAVENOUS | Status: DC
  Administered 2018-02-20: 11:00:00 via INTRAVENOUS

## 2018-02-20 MED ORDER — IOHEXOL 350 MG/ML SOLN
INTRAVENOUS | Status: DC | PRN
  Administered 2018-02-20: 80 mL via INTRA_ARTERIAL

## 2018-02-20 MED ORDER — SODIUM CHLORIDE 0.9% FLUSH: 3.0000 mL | Freq: Two times a day (BID) | INTRAVENOUS | Status: DC

## 2018-02-20 MED ORDER — ACETAMINOPHEN 325 MG PO TABS: 650.0000 mg | ORAL_TABLET | ORAL | Status: DC | PRN

## 2018-02-20 MED ORDER — SODIUM CHLORIDE 0.9% FLUSH: 3.0000 mL | INTRAVENOUS | Status: DC | PRN

## 2018-02-20 MED ORDER — LIDOCAINE HCL (PF) 1 % IJ SOLN
INTRAMUSCULAR | Status: AC
  Filled 2018-02-20: qty 30

## 2018-02-20 MED ORDER — HEPARIN (PORCINE) IN NACL 1000-0.9 UT/500ML-% IV SOLN
INTRAVENOUS | Status: DC | PRN
  Administered 2018-02-20 (×2): 500 mL

## 2018-02-20 MED ORDER — VERAPAMIL HCL 2.5 MG/ML IV SOLN
INTRAVENOUS | Status: AC
  Filled 2018-02-20: qty 2

## 2018-02-20 MED ORDER — HYDRALAZINE HCL 50 MG PO TABS
75.0000 mg | ORAL_TABLET | Freq: Three times a day (TID) | ORAL | Status: DC
  Administered 2018-02-20 – 2018-02-23 (×9): 75 mg via ORAL
  Filled 2018-02-20 (×9): qty 1

## 2018-02-20 MED ORDER — SODIUM CHLORIDE 0.9% FLUSH
3.0000 mL | Freq: Two times a day (BID) | INTRAVENOUS | Status: DC
  Administered 2018-02-20 – 2018-02-23 (×6): 3 mL via INTRAVENOUS

## 2018-02-20 MED ORDER — SODIUM CHLORIDE 0.9 % IV SOLN: INTRAVENOUS | Status: AC

## 2018-02-20 MED ORDER — VERAPAMIL HCL 2.5 MG/ML IV SOLN
INTRAVENOUS | Status: DC | PRN
  Administered 2018-02-20: 10 mL via INTRA_ARTERIAL

## 2018-02-20 MED ORDER — RIVAROXABAN 20 MG PO TABS
20.0000 mg | ORAL_TABLET | Freq: Every day | ORAL | Status: DC
  Administered 2018-02-20 – 2018-02-23 (×4): 20 mg via ORAL
  Filled 2018-02-20 (×4): qty 1

## 2018-02-20 SURGICAL SUPPLY — 14 items
CATH 5FR JL3.5 JR4 ANG PIG MP (CATHETERS) ×1 IMPLANT
CATH BALLN WEDGE 5F 110CM (CATHETERS) ×1 IMPLANT
CATH INFINITI 5 FR AR1 MOD (CATHETERS) ×1 IMPLANT
CATH INFINITI 5FR AL1 (CATHETERS) ×1 IMPLANT
CATH INFINITI 5FR JL4 (CATHETERS) ×1 IMPLANT
DEVICE RAD COMP TR BAND LRG (VASCULAR PRODUCTS) ×1 IMPLANT
GLIDESHEATH SLEND SS 6F .021 (SHEATH) ×1 IMPLANT
GUIDEWIRE INQWIRE 1.5J.035X260 (WIRE) IMPLANT
INQWIRE 1.5J .035X260CM (WIRE) ×2
KIT HEART LEFT (KITS) ×2 IMPLANT
PACK CARDIAC CATHETERIZATION (CUSTOM PROCEDURE TRAY) ×2 IMPLANT
SHEATH GLIDE SLENDER 4/5FR (SHEATH) ×1 IMPLANT
TRANSDUCER W/STOPCOCK (MISCELLANEOUS) ×2 IMPLANT
TUBING CIL FLEX 10 FLL-RA (TUBING) ×2 IMPLANT

## 2018-02-20 NOTE — Interval H&P Note (Signed)
History and Physical Interval Note:  02/20/2018 4:58 PM  Jay Chen  has presented today for surgery, with the diagnosis of cp  The various methods of treatment have been discussed with the patient and family. After consideration of risks, benefits and other options for treatment, the patient has consented to  Procedure(s): RIGHT/LEFT HEART CATH AND CORONARY ANGIOGRAPHY (N/A) and possible coronary angioplasty as a surgical intervention .  The patient's history has been reviewed, patient examined, no change in status, stable for surgery.  I have reviewed the patient's chart and labs.  Questions were answered to the patient's satisfaction.     Daniel Bensimhon

## 2018-02-20 NOTE — Progress Notes (Signed)
No orders in yet for patient's cath. IV fluids and asprin given per cath protocol. Patient has ben NPO since midnight. No acute changes overnight.

## 2018-02-20 NOTE — Progress Notes (Signed)
Received patient post Heart cath. Right radial site level 0 dressing CDI., Right brachial dressing CDI, level 0.Patient alert and oriented.

## 2018-02-20 NOTE — H&P (View-Only) (Signed)
Advanced Heart Failure Rounding Note  PCP:  Primary Cardiologist:   Subjective:   Echo 12/2017 EF 20-25%  Yesterday Toprol XL increased to 75 mg daily. Continues to diurese with IV lasix. Negative 1.6 liters.   He is NPO for cath. Hungry. Denies CP or SOB. Creatinine up slightly.   Objective:   Weight Range: (!) 155.6 kg Body mass index is 52.17 kg/m.   Vital Signs:   Temp:  [97.7 F (36.5 C)-99.9 F (37.7 C)] 98.7 F (37.1 C) (09/30 0526) Pulse Rate:  [63-101] 101 (09/30 0526) Resp:  [18-20] 20 (09/30 0526) BP: (117-142)/(77-104) 138/104 (09/30 0526) SpO2:  [92 %-94 %] 93 % (09/30 0526) Weight:  [155.6 kg] 155.6 kg (09/30 0100) Last BM Date: 02/17/18  Weight change: Filed Weights   02/18/18 0517 02/19/18 0700 02/20/18 0100  Weight: (!) 159.8 kg (!) 156 kg (!) 155.6 kg    Intake/Output:   Intake/Output Summary (Last 24 hours) at 02/20/2018 0809 Last data filed at 02/20/2018 0634 Gross per 24 hour  Intake 1601.28 ml  Output 2575 ml  Net -973.72 ml      Physical Exam    General:   No resp difficulty. NAD  HEENT: normal Neck: supple. Hard to assess due to body habitus.  Carotids 2+ bilat; no bruits. No lymphadenopathy or thryomegaly appreciated. Cor: PMI nondisplaced. Irregular rate & rhythm. No rubs, gallops or murmurs. Lungs: clear on 2 liters oxygen.  Abdomen: obese, soft, nontender, nondistended. No hepatosplenomegaly. No bruits or masses. Good bowel sounds. Extremities: no cyanosis, clubbing, rash, R and LLE trace edema Neuro: alert & orientedx3, cranial nerves grossly intact. moves all 4 extremities w/o difficulty. Affect pleasan   Telemetry   A fib 90-100s personally reviewed.    EKG    None this am  Labs    CBC Recent Labs    02/19/18 0447 02/20/18 0515  WBC 10.0 10.1  HGB 12.9* 13.3  HCT 40.2 41.8  MCV 98.8 101.0*  PLT 195 409   Basic Metabolic Panel Recent Labs    02/19/18 0447 02/20/18 0515  NA 139 140  K 2.9* 3.1*  CL  92* 91*  CO2 34* 38*  GLUCOSE 106* 140*  BUN 26* 30*  CREATININE 1.65* 1.85*  CALCIUM 8.5* 8.8*   Liver Function Tests No results for input(s): AST, ALT, ALKPHOS, BILITOT, PROT, ALBUMIN in the last 72 hours. No results for input(s): LIPASE, AMYLASE in the last 72 hours. Cardiac Enzymes No results for input(s): CKTOTAL, CKMB, CKMBINDEX, TROPONINI in the last 72 hours.  BNP: BNP (last 3 results) Recent Labs    12/27/17 2354 02/15/18 1559  BNP 283.3* 847.1*    ProBNP (last 3 results) No results for input(s): PROBNP in the last 8760 hours.   D-Dimer No results for input(s): DDIMER in the last 72 hours. Hemoglobin A1C No results for input(s): HGBA1C in the last 72 hours. Fasting Lipid Panel No results for input(s): CHOL, HDL, LDLCALC, TRIG, CHOLHDL, LDLDIRECT in the last 72 hours. Thyroid Function Tests No results for input(s): TSH, T4TOTAL, T3FREE, THYROIDAB in the last 72 hours.  Invalid input(s): FREET3  Other results:   Imaging    No results found.   Medications:     Scheduled Medications: . allopurinol  100 mg Oral Daily  . amiodarone  200 mg Oral Daily  . aspirin EC  81 mg Oral Daily  . atorvastatin  80 mg Oral q1800  . furosemide  80 mg Intravenous Q8H  . hydrALAZINE  50 mg Oral Q8H  . insulin aspart  0-15 Units Subcutaneous TID WC  . insulin aspart  0-5 Units Subcutaneous QHS  . isosorbide mononitrate  30 mg Oral Daily  . metoprolol succinate  75 mg Oral Daily  . omega-3 acid ethyl esters  1 g Oral BID  . potassium chloride  40 mEq Oral Daily  . sodium chloride flush  3 mL Intravenous Q12H    Infusions: . sodium chloride 10 mL/hr at 02/20/18 6294  . heparin 1,700 Units/hr (02/20/18 0634)    PRN Medications: sodium chloride, acetaminophen, guaiFENesin-dextromethorphan, ondansetron (ZOFRAN) IV, sodium chloride flush    Patient Profile   Jay Chen is a 52 y.o. male with acute on chronic combined CHF, presumed non-ischemic (no cath),  EF 20-25%, AAA 4.7 cm by echo, hx of CVA, HTN, HLD, OSA, DM2, CKD III (Baseline Cr 1.7 - 1.9), and morbid obesity (Body mass index is 56.56 kg/m.)   Admitted from [facility] clinic 02/15/18 with acute on chronic combined CHF and marked volume overload.    Assessment/Plan   1. Acute on chronic combined CHF, presumed non-ischemic (no cath) - Echo 12/2017 EF 20-25% - R/LHC today.  - Hold lasix until after cath. Overall he has diuresed 37 pounds.  - Continue Toprol 75  daily - Increase hydralazine to 75 mg tid.  - Continue imdur 30 mg daily - No ARB/ARNi/spiro currently with CKD 3 - Consider adding spiro/Entresto after cath   2. AKI on CKD III - Baseline Cr 1.7 - 1.9 - Creatinine 1.85   3. Persistent Afib - New diagnosis 08/2017, Currently on rhythm control strategy. May be worth attempt at [facility] prior to d/c - Has been on Amio 200mg  daily and Toprol 75 mg .  - Rate 90-100s.  - Xarelto on hold for cath. IV heparin started. Discussed dosing with PharmD personally.   4. H/o CVA - Only remaining deficit.  - On Xarelto chronically.    5. HTN - Elevated. Increase hydralazine to 75 mg three times a day. - Cr borderline for ARB/ARNi/Spiro.    6. AAA  - 4.7 cm by echo 12/2017    7. OSA - Non-compliant with CPAP. He seems willing to try different masks than the one he tried "years" ago.    8. DM2 - cbg very stable, SSI while in house.  - Hgb A1C 7.1 01/24/18   9. Morbid obesity -Body mass index is 52.17 kg/m. - Needs significant weight loss.  - Nutrition consult ordered  Cath later this afternoon.   Length of Stay: [place], NP  02/20/2018, 8:09 AM  Advanced Heart Failure Team Pager [phone] (M-F; 7a - 4p)  Please contact [facility] for night-coverage after hours (4p -7a ) and weekends on [email or web address]  Patient seen and examined with [name], NP. We discussed all aspects of the encounter. I agree with the assessment and plan as stated above.   Volume status much  improved. Creatinine up slightly but still within baseline range. For cath today. AF rate improved. Would consider DC-CV either prior to discharge or as outpatient depending on cath results.   Glori Bickers, MD  8:29 AM

## 2018-02-20 NOTE — Progress Notes (Signed)
ANTICOAGULATION CONSULT NOTE   Pharmacy Consult for Heparin Indication: atrial fibrillation  No Known Allergies  Patient Measurements: Height: 5\' 8"  (172.7 cm) Weight: (!) 343 lb 1.6 oz (155.6 kg) IBW/kg (Calculated) : 68.4 HEPARIN DW (KG): 111.6   Vital Signs: Temp: 98.7 F (37.1 C) (09/30 0526) Temp Source: Oral (09/30 0526) BP: 138/88 (09/30 0856) Pulse Rate: 111 (09/30 0856)  Labs: Recent Labs    02/18/18 0550 02/18/18 1429 02/19/18 0447 02/19/18 1334 02/20/18 0515  HGB 12.9*  --  12.9*  --  13.3  HCT 40.5  --  40.2  --  41.8  PLT 199  --  195  --  233  APTT 64* 57* 56*  --   --   HEPARINUNFRC  --  0.71* 0.35 0.33 0.24*  CREATININE 1.79*  --  1.65*  --  1.85*    Estimated Creatinine Clearance: 66.7 mL/min (A) (by C-G formula based on SCr of 1.85 mg/dL (H)).   Medical History: Past Medical History:  Diagnosis Date  . CKD (chronic kidney disease), stage III ([place])   . Congestive heart failure [facility]) May of 2011   Felt to have cor pulmonale; EF 45 to 50% from echo in May 2011  . Cor pulmonale (chronic) ([place])   . Gout    "take daily RX" (02/15/2018)  . History of kidney stones   . Hyperlipidemia   . Hypertension   . Morbid obesity ([place])   . Persistent atrial fibrillation ([place])    [name] 12/28/2017  . Sleep apnea    "dx'd; couldn't tolerate CPAP" (02/15/2018)  . Thoracic aneurysm    a. 4.8cm thoracic aortic aneurysm by CT 2013.  [name] Thoracic aortic aneurysm ([place])    known/notes 12/28/2017  . Type II diabetes mellitus (H[facility])     Assessment: 70 yoM on Xarelto PTA for hx PAF and CVA. Pt admitted with acute CHF, planning to undergo cardiac cath on Monday 9/30 so pharmacy has been consulted to transition to IV heparin. Last dose of Xarelto was 9/26 at 1800.   Heparin level low this AM at 0.24  Goal of Therapy:  Heparin level 0.3-0.7 units/ml Monitor platelets by anticoagulation protocol: Yes   Plan:  Increase heparin to 1800 units / hr Follow up after  cath  Thank you Anette Guarneri, PharmD [phone]   02/20/2018   9:43 AM

## 2018-02-20 NOTE — Progress Notes (Addendum)
Advanced Heart Failure Rounding Note  PCP:  Primary Cardiologist:   Subjective:   Echo 12/2017 EF 20-25%  Yesterday Toprol XL increased to 75 mg daily. Continues to diurese with IV lasix. Negative 1.6 liters.   He is NPO for cath. Hungry. Denies CP or SOB. Creatinine up slightly.   Objective:   Weight Range: (!) 155.6 kg Body mass index is 52.17 kg/m.   Vital Signs:   Temp:  [97.7 F (36.5 C)-99.9 F (37.7 C)] 98.7 F (37.1 C) (09/30 0526) Pulse Rate:  [63-101] 101 (09/30 0526) Resp:  [18-20] 20 (09/30 0526) BP: (117-142)/(77-104) 138/104 (09/30 0526) SpO2:  [92 %-94 %] 93 % (09/30 0526) Weight:  [155.6 kg] 155.6 kg (09/30 0100) Last BM Date: 02/17/18  Weight change: Filed Weights   02/18/18 0517 02/19/18 0700 02/20/18 0100  Weight: (!) 159.8 kg (!) 156 kg (!) 155.6 kg    Intake/Output:   Intake/Output Summary (Last 24 hours) at 02/20/2018 0809 Last data filed at 02/20/2018 0634 Gross per 24 hour  Intake 1601.28 ml  Output 2575 ml  Net -973.72 ml      Physical Exam    General:   No resp difficulty. NAD  HEENT: normal Neck: supple. Hard to assess due to body habitus.  Carotids 2+ bilat; no bruits. No lymphadenopathy or thryomegaly appreciated. Cor: PMI nondisplaced. Irregular rate & rhythm. No rubs, gallops or murmurs. Lungs: clear on 2 liters oxygen.  Abdomen: obese, soft, nontender, nondistended. No hepatosplenomegaly. No bruits or masses. Good bowel sounds. Extremities: no cyanosis, clubbing, rash, R and LLE trace edema Neuro: alert & orientedx3, cranial nerves grossly intact. moves all 4 extremities w/o difficulty. Affect pleasan   Telemetry   A fib 90-100s personally reviewed.    EKG    None this am  Labs    CBC Recent Labs    02/19/18 0447 02/20/18 0515  WBC 10.0 10.1  HGB 12.9* 13.3  HCT 40.2 41.8  MCV 98.8 101.0*  PLT 195 017   Basic Metabolic Panel Recent Labs    02/19/18 0447 02/20/18 0515  NA 139 140  K 2.9* 3.1*  CL  92* 91*  CO2 34* 38*  GLUCOSE 106* 140*  BUN 26* 30*  CREATININE 1.65* 1.85*  CALCIUM 8.5* 8.8*   Liver Function Tests No results for input(s): AST, ALT, ALKPHOS, BILITOT, PROT, ALBUMIN in the last 72 hours. No results for input(s): LIPASE, AMYLASE in the last 72 hours. Cardiac Enzymes No results for input(s): CKTOTAL, CKMB, CKMBINDEX, TROPONINI in the last 72 hours.  BNP: BNP (last 3 results) Recent Labs    12/27/17 2354 02/15/18 1559  BNP 283.3* 847.1*    ProBNP (last 3 results) No results for input(s): PROBNP in the last 8760 hours.   D-Dimer No results for input(s): DDIMER in the last 72 hours. Hemoglobin A1C No results for input(s): HGBA1C in the last 72 hours. Fasting Lipid Panel No results for input(s): CHOL, HDL, LDLCALC, TRIG, CHOLHDL, LDLDIRECT in the last 72 hours. Thyroid Function Tests No results for input(s): TSH, T4TOTAL, T3FREE, THYROIDAB in the last 72 hours.  Invalid input(s): FREET3  Other results:   Imaging    No results found.   Medications:     Scheduled Medications: . allopurinol  100 mg Oral Daily  . amiodarone  200 mg Oral Daily  . aspirin EC  81 mg Oral Daily  . atorvastatin  80 mg Oral q1800  . furosemide  80 mg Intravenous Q8H  . hydrALAZINE  50 mg Oral Q8H  . insulin aspart  0-15 Units Subcutaneous TID WC  . insulin aspart  0-5 Units Subcutaneous QHS  . isosorbide mononitrate  30 mg Oral Daily  . metoprolol succinate  75 mg Oral Daily  . omega-3 acid ethyl esters  1 g Oral BID  . potassium chloride  40 mEq Oral Daily  . sodium chloride flush  3 mL Intravenous Q12H    Infusions: . sodium chloride 10 mL/hr at 02/20/18 1941  . heparin 1,700 Units/hr (02/20/18 0634)    PRN Medications: sodium chloride, acetaminophen, guaiFENesin-dextromethorphan, ondansetron (ZOFRAN) IV, sodium chloride flush    Patient Profile   Jay Chen is a 52 y.o. male with acute on chronic combined CHF, presumed non-ischemic (no cath),  EF 20-25%, AAA 4.7 cm by echo, hx of CVA, HTN, HLD, OSA, DM2, CKD III (Baseline Cr 1.7 - 1.9), and morbid obesity (Body mass index is 56.56 kg/m.)   Admitted from [facility] clinic 02/15/18 with acute on chronic combined CHF and marked volume overload.    Assessment/Plan   1. Acute on chronic combined CHF, presumed non-ischemic (no cath) - Echo 12/2017 EF 20-25% - R/LHC today.  - Hold lasix until after cath. Overall he has diuresed 37 pounds.  - Continue Toprol 75  daily - Increase hydralazine to 75 mg tid.  - Continue imdur 30 mg daily - No ARB/ARNi/spiro currently with CKD 3 - Consider adding spiro/Entresto after cath   2. AKI on CKD III - Baseline Cr 1.7 - 1.9 - Creatinine 1.85   3. Persistent Afib - New diagnosis 08/2017, Currently on rhythm control strategy. May be worth attempt at [facility] prior to d/c - Has been on Amio 200mg  daily and Toprol 75 mg .  - Rate 90-100s.  - Xarelto on hold for cath. IV heparin started. Discussed dosing with PharmD personally.   4. H/o CVA - Only remaining deficit.  - On Xarelto chronically.    5. HTN - Elevated. Increase hydralazine to 75 mg three times a day. - Cr borderline for ARB/ARNi/Spiro.    6. AAA  - 4.7 cm by echo 12/2017    7. OSA - Non-compliant with CPAP. He seems willing to try different masks than the one he tried "years" ago.    8. DM2 - cbg very stable, SSI while in house.  - Hgb A1C 7.1 01/24/18   9. Morbid obesity -Body mass index is 52.17 kg/m. - Needs significant weight loss.  - Nutrition consult ordered  Cath later this afternoon.   Length of Stay: [place], NP  02/20/2018, 8:09 AM  Advanced Heart Failure Team Pager [phone] (M-F; 7a - 4p)  Please contact [facility] for night-coverage after hours (4p -7a ) and weekends on [email or web address]  Patient seen and examined with [name], NP. We discussed all aspects of the encounter. I agree with the assessment and plan as stated above.   Volume status much  improved. Creatinine up slightly but still within baseline range. For cath today. AF rate improved. Would consider DC-CV either prior to discharge or as outpatient depending on cath results.   Glori Bickers, MD  8:29 AM

## 2018-02-21 ENCOUNTER — Encounter (HOSPITAL_COMMUNITY): Payer: Self-pay | Admitting: Internal Medicine

## 2018-02-21 DIAGNOSIS — M4712 Other spondylosis with myelopathy, cervical region: Secondary | ICD-10-CM

## 2018-02-21 DIAGNOSIS — I480 Paroxysmal atrial fibrillation: Secondary | ICD-10-CM

## 2018-02-21 DIAGNOSIS — R5381 Other malaise: Secondary | ICD-10-CM

## 2018-02-21 LAB — BASIC METABOLIC PANEL
Anion gap: 13 (ref 5–15)
BUN: 37 mg/dL — ABNORMAL HIGH (ref 6–20)
CO2: 35 mmol/L — ABNORMAL HIGH (ref 22–32)
CREATININE: 1.82 mg/dL — AB (ref 0.61–1.24)
Calcium: 9.1 mg/dL (ref 8.9–10.3)
Chloride: 94 mmol/L — ABNORMAL LOW (ref 98–111)
GFR, EST AFRICAN AMERICAN: 47 mL/min — AB (ref >60)
GFR, EST NON AFRICAN AMERICAN: 40 mL/min — AB (ref >60)
Glucose, Bld: 122 mg/dL — ABNORMAL HIGH (ref 70–99)
Potassium: 3.5 mmol/L (ref 3.5–5.1)
SODIUM: 142 mmol/L (ref 135–145)

## 2018-02-21 LAB — GLUCOSE, CAPILLARY
GLUCOSE-CAPILLARY: 176 mg/dL — AB (ref 70–99)
GLUCOSE-CAPILLARY: 124 mg/dL — AB (ref 70–99)
Glucose-Capillary: 131 mg/dL — ABNORMAL HIGH (ref 70–99)
Glucose-Capillary: 113 mg/dL — ABNORMAL HIGH (ref 70–99)

## 2018-02-21 LAB — CBC
HCT: 41.9 % (ref 39.0–52.0)
HEMOGLOBIN: 13.1 g/dL (ref 13.0–17.0)
MCH: 31.3 pg (ref 26.0–34.0)
MCHC: 31.3 g/dL (ref 30.0–36.0)
MCV: 100 fL (ref 78.0–100.0)
PLATELETS: 262 10*3/uL (ref 150–400)
RBC: 4.19 MIL/uL — ABNORMAL LOW (ref 4.22–5.81)
RDW: 16.4 % — ABNORMAL HIGH (ref 11.5–15.5)
WBC: 7.5 10*3/uL (ref 4.0–10.5)

## 2018-02-21 MED ORDER — FUROSEMIDE 10 MG/ML IJ SOLN
80.0000 mg | Freq: Two times a day (BID) | INTRAMUSCULAR | Status: DC
  Administered 2018-02-21 (×2): 80 mg via INTRAVENOUS
  Filled 2018-02-21 (×2): qty 8

## 2018-02-21 MED ORDER — AMIODARONE HCL 200 MG PO TABS
200.0000 mg | ORAL_TABLET | Freq: Two times a day (BID) | ORAL | Status: DC
  Administered 2018-02-21 – 2018-02-23 (×5): 200 mg via ORAL
  Filled 2018-02-21 (×5): qty 1

## 2018-02-21 MED ORDER — POTASSIUM CHLORIDE CRYS ER 20 MEQ PO TBCR
40.0000 meq | EXTENDED_RELEASE_TABLET | Freq: Two times a day (BID) | ORAL | Status: DC
  Administered 2018-02-21 (×2): 40 meq via ORAL
  Filled 2018-02-21 (×2): qty 2

## 2018-02-21 NOTE — Plan of Care (Signed)
  Problem: Education: Goal: Knowledge of General Education information will improve Description Including pain rating scale, medication(s)/side effects and non-pharmacologic comfort measures Outcome: Progressing   Problem: Health Behavior/Discharge Planning: Goal: Ability to manage health-related needs will improve Outcome: Progressing   Problem: Activity: Goal: Risk for activity intolerance will decrease Outcome: Progressing   Problem: Nutrition: Goal: Adequate nutrition will be maintained Outcome: Progressing   Problem: Elimination: Goal: Will not experience complications related to bowel motility Outcome: Completed/Met

## 2018-02-21 NOTE — Progress Notes (Addendum)
Advanced Heart Failure Rounding Note  PCP:  Primary Cardiologist:   Subjective:   Echo 12/2017 EF 20-25%  Yesterday hydralazine was increased to 75 mg three times a day.   Denies SOB. Has not been out of bed. PT has seen and is recommending CIR     RHC/LHC  Ao = 125/87 (104) LV = 122/23  RA = 17  RV = 56/16 PA = 52/35 (42) PCW = 21 Fick cardiac output/index = 7.0.2.7 SVR = 993 PVR = 3.0 WU Ao sat = 92% PA sat = 66%, 65%  Assessment: 1. Normal coronaries 2. 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 Heart appears rotated 90% leftward on cath images 4. Moderate mixed pulmonary venous/arterial HTN  Objective:   Weight Range: (!) 154.6 kg Body mass index is 51.82 kg/m.   Vital Signs:   Temp:  [97.8 F (36.6 C)-98.6 F (37 C)] 98.4 F (36.9 C) (10/01 0420) Pulse Rate:  [75-126] 100 (10/01 0420) Resp:  [10-61] 20 (10/01 0420) BP: (103-143)/(66-110) 141/88 (10/01 0526) SpO2:  [88 %-98 %] 92 % (10/01 0420) Weight:  [154.6 kg] 154.6 kg (10/01 0420) Last BM Date: 02/18/18  Weight change: Filed Weights   02/19/18 0700 02/20/18 0100 02/21/18 0420  Weight: (!) 156 kg (!) 155.6 kg (!) 154.6 kg    Intake/Output:   Intake/Output Summary (Last 24 hours) at 02/21/2018 0810 Last data filed at 02/21/2018 0700 Gross per 24 hour  Intake 530.22 ml  Output 1500 ml  Net -969.78 ml      Physical Exam    General:  No resp difficulty HEENT: normal Neck: supple. JVP seems elevated.  Carotids 2+ bilat; no bruits. No lymphadenopathy or thryomegaly appreciated. Cor: PMI nondisplaced. Tachy irregular rate & rhythm. No rubs, gallops or murmurs. Lungs: clear on 2 liters  Abdomen: obese, soft, nontender, nondistended. No hepatosplenomegaly. No bruits or masses. Good bowel sounds. Extremities: no cyanosis, clubbing, rash, R and LLE trace edema.  Neuro: alert & orientedx3, cranial nerves grossly intact. moves all 4 extremities w/o difficulty. Affect pleasant   Telemetry  A fib  100-120s personally reviewed.    EKG    None this am  Labs    CBC Recent Labs    02/20/18 0515 02/21/18 0424  WBC 10.1 7.5  HGB 13.3 13.1  HCT 41.8 41.9  MCV 101.0* 100.0  PLT 233 734   Basic Metabolic Panel Recent Labs    02/20/18 0515 02/21/18 0424  NA 140 142  K 3.1* 3.5  CL 91* 94*  CO2 38* 35*  GLUCOSE 140* 122*  BUN 30* 37*  CREATININE 1.85* 1.82*  CALCIUM 8.8* 9.1   Liver Function Tests No results for input(s): AST, ALT, ALKPHOS, BILITOT, PROT, ALBUMIN in the last 72 hours. No results for input(s): LIPASE, AMYLASE in the last 72 hours. Cardiac Enzymes No results for input(s): CKTOTAL, CKMB, CKMBINDEX, TROPONINI in the last 72 hours.  BNP: BNP (last 3 results) Recent Labs    12/27/17 2354 02/15/18 1559  BNP 283.3* 847.1*    ProBNP (last 3 results) No results for input(s): PROBNP in the last 8760 hours.   D-Dimer No results for input(s): DDIMER in the last 72 hours. Hemoglobin A1C No results for input(s): HGBA1C in the last 72 hours. Fasting Lipid Panel No results for input(s): CHOL, HDL, LDLCALC, TRIG, CHOLHDL, LDLDIRECT in the last 72 hours. Thyroid Function Tests No results for input(s): TSH, T4TOTAL, T3FREE, THYROIDAB in the last 72 hours.  Invalid input(s): FREET3  Other results:   Imaging    No results found.   Medications:     Scheduled Medications: . allopurinol  100 mg Oral Daily  . amiodarone  200 mg Oral Daily  . aspirin EC  81 mg Oral Daily  . atorvastatin  80 mg Oral q1800  . hydrALAZINE  75 mg Oral Q8H  . insulin aspart  0-15 Units Subcutaneous TID WC  . insulin aspart  0-5 Units Subcutaneous QHS  . isosorbide mononitrate  30 mg Oral Daily  . metoprolol succinate  75 mg Oral Daily  . omega-3 acid ethyl esters  1 g Oral BID  . potassium chloride  40 mEq Oral Daily  . rivaroxaban  20 mg Oral Q supper  . sodium chloride flush  3 mL Intravenous Q12H  . sodium chloride flush  3 mL Intravenous Q12H     Infusions: . sodium chloride 10 mL/hr at 02/20/18 1016  . sodium chloride      PRN Medications: sodium chloride, sodium chloride, acetaminophen, guaiFENesin-dextromethorphan, ondansetron (ZOFRAN) IV, sodium chloride flush, sodium chloride flush    Patient Profile   Jay Chen is a 52 y.o. 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    Assessment/Plan   1. Acute on chronic combined CHF, presumed non-ischemic (no cath) - Echo 12/2017 EF 20-25% - R/LHC normal cors. RA 17 PCWP 21  Overall he has diuresed  40 pounds.  - Volume status elevated. Restart 80 mg IV lasix twice a day.  Increase potassium to 40 meq twice a day.  - Continue Toprol 75  daily - Continue hydralazine to 75 mg tid.  - Continue imdur 30 mg daily - No ARB/ARNi/spiro currently with CKD 3 - Consider adding spiro/Entresto after cath   2. AKI on CKD III - Baseline Cr 1.7 - 1.9 - Creatinine 1.82   3. Persistent Afib - New diagnosis 08/2017, Currently on rhythm control strategy. May be worth attempt at [facility] prior to d/c -Rate 100-120s - Increase amio to 200 mg twice a day.  - Continue  Toprol 75 mg daily.  -Continue Xarelto 20 mg daily   4. H/o CVA - Only remaining deficit.  - On Xarelto chronically.    5. HTN - Elevated. Hopefully will improve with diuresis.  - Cr borderline for ARB/ARNi/Spiro.    6. AAA  - 4.7 cm by echo 12/2017    7. OSA - Non-compliant with CPAP. Discussed importance of CPAP.   8. DM2 - CBG ok. Continue ssi.  - Hgb A1C 7.1 01/24/18   9. Morbid obesity -Body mass index is 51.82 kg/m. - Needs significant weight loss.  - Nutrition consult ordered  COnsult PT and cardiac rehab.     Length of  Stay: [place], NP  02/21/2018, 8:10 AM  Advanced Heart Failure Team Pager [phone] (M-F; [place])  Please contact [facility] for night-coverage after hours (4p -7a ) and weekends on [email or web address]  Patient seen and examined with [name], NP. We discussed all aspects of the encounter. I agree with the assessment and plan as stated above.   Cath results reviewed with him. Has normal coronaries and severe NICM. With EF 20-25%.  Unclear etiology ? AF or OSA/OHS. Creatinine stable post cath. Agree with careful diuresis. Watch renal function closely. Given possible tachy-induced CM, I think it is reasonable to try DC-CV for AF although given sive and OSA probable low likelihood of long-term maintenance of NSR. Continue Xarelto for now. Will discuss schedule with Endoscopy. Could do as outpatient. Also has ascending aortic aneurysm (was 4.8 by CT in 2013 and 4.7 by recent echo) and will need further imaging with CT as creatinine permits. Will place CIR consult.   Glori Bickers, MD  11:33 AM

## 2018-02-21 NOTE — Consult Note (Signed)
Physical Medicine and Rehabilitation Consult   Reason for Consult: Functional decline due to acute exacerbation of CHF Referring Physician: Dr. [name]   HPI: Jay Chen is a 52 y.o. male with history of CKD, HTN, morbid obesity, OSA, AAA-4.7 cm, A. fib, L-CVA with CIR 12/2017,  Severe cord compression with cervical myelopathy, chronic systolic CHF who was admitted on 02/15/2018 with acute on chronic CHF, weight gain of 27 lbs, LLE edema and worsening dyspnea.  He was started on with IV diuresis with weight down by 37 lbs.  Dr. [name] consulted for management of CHF and he underwent cardiac catheterization yesterday revealing EF 20-25% with normal coronaries.  Amiodarone increased to bid due to persistent A. fib and question plans for DC-CV prior to discharge. PT evaluation done today and patient limited by fatigue and poor safety awareness. CIR recommended due to functional decline.    Review of Systems  Constitutional: Negative for chills and fever.  HENT: Negative for hearing loss and tinnitus.   Eyes: Negative for blurred vision.  Respiratory: Negative for shortness of breath and wheezing.   Cardiovascular: Negative for chest pain and palpitations.  Gastrointestinal: Negative for abdominal pain, constipation, heartburn and nausea.  Genitourinary: Negative for dysuria and urgency.  Musculoskeletal: Positive for back pain, joint pain (left hip) and myalgias.  Neurological: Positive for focal weakness. Negative for dizziness and headaches.  Psychiatric/Behavioral: Negative for memory loss. The patient does not have insomnia.       Past Medical History:  Diagnosis Date  . CKD (chronic kidney disease), stage III ([place])   . Congestive heart failure [facility]) May of 2011   Felt to have cor pulmonale; EF 45 to 50% from echo in May 2011  . Cor pulmonale (chronic) ([place])   . Gout    "take daily RX" (02/15/2018)  . History of kidney stones   . Hyperlipidemia   . Hypertension     . Morbid obesity ([place])   . Persistent atrial fibrillation    Jay Chen 12/28/2017  . Sleep apnea    "dx'd; couldn't tolerate CPAP" (02/15/2018)  . Thoracic aneurysm    a. 4.8cm thoracic aortic aneurysm by CT 2013.  [name] Thoracic aortic aneurysm ([place])    known/notes 12/28/2017  . Type II diabetes mellitus ([place])     Past Surgical History:  Procedure Laterality Date  . LAPAROSCOPIC CHOLECYSTECTOMY  2000  . RIGHT/LEFT HEART CATH AND CORONARY ANGIOGRAPHY N/A 02/20/2018   Procedure: RIGHT/LEFT HEART CATH AND CORONARY ANGIOGRAPHY;  Surgeon: Jolaine Artist, MD;  Location: [place] [facility];  Service: Cardiovascular;  Laterality: N/A;  . US ECHOCARDIOGRAPHY  09/21/2009   EF 45-50%; Cavity size was severely dilated, severe concentric hypertrophy and normal wall motion    Family History  Problem Relation Age of Onset  . Hypertension Mother   . Pancreatic cancer Father   . Prostate cancer Father   . Colon cancer Father     Social History: Jay Chen works day. He was inndependent with ADLs and ambulating with rolling walker --family checks in during the day.  He reports that he has never smoked. He has never used smokeless tobacco. He reports that he quit alcohol use a couple of years ago. He reports that he does not use drugs.    Allergies: No Known Allergies    Medications Prior to Admission  Medication Sig Dispense Refill  . acetaminophen (TYLENOL) 325 MG tablet Take 2 tablets (650 mg total) by mouth every 6 (six) hours  as needed for mild pain or headache (or Fever >/= 101).    [name] allopurinol (ZYLOPRIM) 100 MG tablet Take 1 tablet (100 mg total) by mouth daily. 90 tablet 0  . amiodarone (PACERONE) 200 MG tablet Take 1 tablet (200 mg total) by mouth daily. 30 tablet 1  . atorvastatin (LIPITOR) 80 MG tablet Take 1 tablet (80 mg total) by mouth daily. 30 tablet 1  . furosemide (LASIX) 40 MG tablet Take 1 tablet (40 mg total) by mouth daily. 90 tablet 1  . magnesium oxide (MAG-OX) 400 MG  tablet Take 1 tablet (400 mg total) by mouth daily. 90 tablet 1  . methocarbamol (ROBAXIN) 500 MG tablet Take 1 tablet (500 mg total) by mouth 3 (three) times daily. (Patient taking differently: Take 500 mg by mouth 2 (two) times daily. ) 90 tablet 0  . metoprolol succinate (TOPROL-XL) 100 MG 24 hr tablet Take one and one-half tablets (173m) by mouth daily. Take with or immediately following a meal. 135 tablet 3  . omega-3 acid ethyl esters (LOVAZA) 1 g capsule Take 2 capsules (2 g total) by mouth 2 (two) times daily. 120 capsule 11  . potassium chloride SA (KLOR-CON M20) 20 MEQ tablet Take 2 tablets (40 mEq total) by mouth daily. 180 tablet 1  . rivaroxaban (XARELTO) 20 MG TABS tablet Take 1 tablet (20 mg total) by mouth daily with supper. 90 tablet 1  . Blood Glucose Monitoring Suppl (ACCU-CHEK GUIDE) w/Device KIT 1 Act by Does not apply route 3 (three) times daily. 2 kit 0  . glucose blood (ACCU-CHEK GUIDE) test strip 1 each by Other route 3 (three) times daily. Use as instructed 300 each 1    Home: Home Living Family/patient expects to be discharged to:: Private residence Living Arrangements: Spouse/significant other, Children Available Help at Discharge: Family, Available PRN/intermittently Type of Home: House Home Access: Stairs to enter ETechnical brewerof Steps: 3 Entrance Stairs-Rails: Can reach both Home Layout: Two level, Bed/bath upstairs, Able to live on main level with bedroom/bathroom Alternate Level Stairs-Number of Steps: 20 Alternate Level Stairs-Rails: Left Bathroom Shower/Tub: Tub/shower unit, CArchitectural technologist Standard Bathroom Accessibility: Yes Home Equipment: WEnvironmental consultant- 2 wheels Additional Comments: 2 kids ages 119and 168and wife goes to school during day  Functional History: Prior Function Level of Independence: Independent with assistive device(s) Comments: takes care of the house and enjoys driving kids to activities  Functional Status:    Mobility: Bed Mobility Overal bed mobility: Needs Assistance Bed Mobility: Supine to Sit Supine to sit: Min guard, HOB elevated General bed mobility comments: S and increased time, cues for safety  Transfers Overall transfer level: Needs assistance Equipment used: Rolling walker (2 wheeled) Transfers: Sit to/from Stand, SW.W. Grainger IncTransfers Sit to Stand: Min guard Stand pivot transfers: Min assist General transfer comment: Min guard and Mod VC for safety with RW; MinA for RW management and safety during stand pivot transfer  Ambulation/Gait General Gait Details: unable due to fatigue     ADL:    Cognition: Cognition Overall Cognitive Status: Within Functional Limits for tasks assessed Cognition Arousal/Alertness: Awake/alert Behavior During Therapy: WFL for tasks assessed/performed Overall Cognitive Status: Within Functional Limits for tasks assessed  Blood pressure 106/77, pulse 60, temperature 98.4 F (36.9 C), temperature source Oral, resp. rate 18, height _0  (1.727 m), weight (!) 154.6 kg, SpO2 94 %. Physical Exam  Nursing note and vitals reviewed. Constitutional:  Morbidly obese  HENT:  Head: Normocephalic.  Eyes: Pupils  are equal, round, and reactive to light.  Neck: Normal range of motion.  Cardiovascular: Normal rate. An irregularly irregular rhythm present.  Respiratory: Effort normal. He has decreased breath sounds in the right lower field and the left lower field.  GI: Soft.  Musculoskeletal: He exhibits edema.  Keeps LLE rotated outwards with weakness at hip/knee. 2+ edema LLE > RLE. Foam dressings bilateral shins.   Neurological: He is alert.  UE 4/5 prox to distal. RLE 3-4/5 prox to distal. LLE 2-3/5 prox to distal. Decreased sense to LT/PP in distal lower ext.   Psychiatric: He has a normal mood and affect. His behavior is normal.    Results for orders placed or performed during the hospital encounter of 02/15/18 (from the past 24 hour(s))  I-STAT  3, arterial blood gas (G3+)     Status: Abnormal   Collection Time: 02/20/18  5:13 PM  Result Value Ref Range   pH, Arterial 7.489 (H) 7.350 - 7.450   pCO2 arterial 50.9 (H) 32.0 - 48.0 mmHg   pO2, Arterial 59.0 (L) 83.0 - 108.0 mmHg   Bicarbonate 38.7 (H) 20.0 - 28.0 mmol/L   TCO2 40 (H) 22 - 32 mmol/L   O2 Saturation 92.0 %   Acid-Base Excess 13.0 (H) 0.0 - 2.0 mmol/L   Patient temperature HIDE    Sample type ARTERIAL   I-STAT 3, venous blood gas (G3P V)     Status: Abnormal   Collection Time: 02/20/18  5:17 PM  Result Value Ref Range   pH, Ven 7.449 (H) 7.250 - 7.430   pCO2, Ven 52.3 44.0 - 60.0 mmHg   pO2, Ven 33.0 32.0 - 45.0 mmHg   Bicarbonate 36.3 (H) 20.0 - 28.0 mmol/L   TCO2 38 (H) 22 - 32 mmol/L   O2 Saturation 66.0 %   Acid-Base Excess 10.0 (H) 0.0 - 2.0 mmol/L   Patient temperature HIDE    Sample type VENOUS    Comment NOTIFIED PHYSICIAN   I-STAT 3, venous blood gas (G3P V)     Status: Abnormal   Collection Time: 02/20/18  5:18 PM  Result Value Ref Range   pH, Ven 7.467 (H) 7.250 - 7.430   pCO2, Ven 54.0 44.0 - 60.0 mmHg   pO2, Ven 32.0 32.0 - 45.0 mmHg   Bicarbonate 39.0 (H) 20.0 - 28.0 mmol/L   TCO2 41 (H) 22 - 32 mmol/L   O2 Saturation 65.0 %   Acid-Base Excess 13.0 (H) 0.0 - 2.0 mmol/L   Patient temperature HIDE    Sample type VENOUS    Comment NOTIFIED PHYSICIAN   Glucose, capillary     Status: None   Collection Time: 02/20/18  6:19 PM  Result Value Ref Range   Glucose-Capillary 92 70 - 99 mg/dL   Comment 1 Notify RN    Comment 2 Document in Chart   Glucose, capillary     Status: Abnormal   Collection Time: 02/20/18  9:30 PM  Result Value Ref Range   Glucose-Capillary 142 (H) 70 - 99 mg/dL   Comment 1 Notify RN    Comment 2 Document in Chart   CBC     Status: Abnormal   Collection Time: 02/21/18  4:24 AM  Result Value Ref Range   WBC 7.5 4.0 - 10.5 K/uL   RBC 4.19 (L) 4.22 - 5.81 MIL/uL   Hemoglobin 13.1 13.0 - 17.0 g/dL   HCT 41.9 39.0 - 52.0  %   MCV 100.0 78.0 - 100.0 fL  MCH 31.3 26.0 - 34.0 pg   MCHC 31.3 30.0 - 36.0 g/dL   RDW 16.4 (H) 11.5 - 15.5 %   Platelets 262 150 - 400 K/uL  Basic metabolic panel     Status: Abnormal   Collection Time: 02/21/18  4:24 AM  Result Value Ref Range   Sodium 142 135 - 145 mmol/L   Potassium 3.5 3.5 - 5.1 mmol/L   Chloride 94 (L) 98 - 111 mmol/L   CO2 35 (H) 22 - 32 mmol/L   Glucose, Bld 122 (H) 70 - 99 mg/dL   BUN 37 (H) 6 - 20 mg/dL   Creatinine, Ser 1.82 (H) 0.61 - 1.24 mg/dL   Calcium 9.1 8.9 - 10.3 mg/dL   GFR calc non Af Amer 40 (L) >60 mL/min   GFR calc Af Amer 47 (L) >60 mL/min   Anion gap 13 5 - 15  Glucose, capillary     Status: Abnormal   Collection Time: 02/21/18  7:55 AM  Result Value Ref Range   Glucose-Capillary 131 (H) 70 - 99 mg/dL   Comment 1 Notify RN   Glucose, capillary     Status: Abnormal   Collection Time: 02/21/18 11:56 AM  Result Value Ref Range   Glucose-Capillary 176 (H) 70 - 99 mg/dL   Comment 1 Notify RN    No results found.   Assessment/Plan: Diagnosis: 52 yo male with hx of cervical spondylosis and myelopathy admitted for substantial fluid overload. Pt with subsequent deconditioning in the setting of his premorbid gait deficits 1. Does the need for close, 24 hr/day medical supervision in concert with the patient's rehab needs make it unreasonable for this patient to be served in a less intensive setting? Yes 2. Co-Morbidities requiring supervision/potential complications: fluid/volume mgt, CKD, HTN 3. Due to bladder management, bowel management, safety, skin/wound care, disease management, medication administration, pain management and patient education, does the patient require 24 hr/day rehab nursing? Yes 4. Does the patient require coordinated care of a physician, rehab nurse, PT (1-2 hrs/day, 5 days/week) and OT (1-2 hrs/day, 5 days/week) to address physical and functional deficits in the context of the above medical diagnosis(es)?  Yes Addressing deficits in the following areas: balance, endurance, locomotion, strength, transferring, bowel/bladder control, bathing, dressing, feeding, grooming, toileting and psychosocial support 5. Can the patient actively participate in an intensive therapy program of at least 3 hrs of therapy per day at least 5 days per week? Yes 6. The potential for patient to make measurable gains while on inpatient rehab is excellent 7. Anticipated functional outcomes upon discharge from inpatient rehab are modified independent  with PT, modified independent and supervision with OT, n/a with SLP. 8. Estimated rehab length of stay to reach the above functional goals is: 7 days 9. Anticipated D/C setting: Home 10. Anticipated post D/C treatments: [place] therapy 11. Overall Rehab/Functional Prognosis: excellent  RECOMMENDATIONS: This patient's condition is appropriate for continued rehabilitative care in the following setting: CIR Patient has agreed to participate in recommended program. Yes Note that insurance prior authorization may be required for reimbursement for recommended care.  Comment: Rehab Admissions Coordinator to follow up.  Thanks,  Meredith Staggers, MD, Mellody Drown  I have personally performed a face to face diagnostic evaluation of this patient. Additionally, I have reviewed and concur with the physician assistant's documentation above.    Bary Leriche, PA-C 02/21/2018

## 2018-02-21 NOTE — Progress Notes (Addendum)
TR Band off. No bleeding noted. Pulse +2 in right radial.

## 2018-02-21 NOTE — Evaluation (Signed)
Physical Therapy Evaluation Patient Details Name: Jay Chen MRN: [id number] DOB: Oct 08, 1965 Today's Date: 02/21/2018   History of Present Illness  52yo male admitted from [facility] clinic on 02/15/18 due to acute on chronic CHF and volume overload. PMH AAA, CVA, HTN, DM, CKD, morbid obesity, EF 20-25%   Clinical Impression   Patient received in bed, easily woken and willing to participate in PT session. Able to complete functional bed mobility with S and increased time, cues for safety, and functional transfers with min guard to MinA for steadying and safety. Fatigued after transfer to bedside commode, able to have productive bowel movement. Able to stand with min guard from commode, therapist placed recliner under him and assisted him with navigation of chair to beside bed. He declined attempting ambulation today due to fatigue. He was left up in the chair with all needs met, nurse tech aware of patient status. He will continue to benefit from skilled PT services in the acute setting as well as intensive therapies in the CIR setting due to significant change in functional status and mobility.     Follow Up Recommendations CIR    Equipment Recommendations  Other (comment)(defer to next venue )    Recommendations for Other Services       Precautions / Restrictions Precautions Precautions: Fall Restrictions Weight Bearing Restrictions: No      Mobility  Bed Mobility Overal bed mobility: Needs Assistance Bed Mobility: Supine to Sit     Supine to sit: Min guard;HOB elevated     General bed mobility comments: S and increased time, cues for safety   Transfers Overall transfer level: Needs assistance Equipment used: Rolling walker (2 wheeled) Transfers: Sit to/from [facility] Sit to Stand: Min guard Stand pivot transfers: Min assist       General transfer comment: Min guard and Mod VC for safety with RW; MinA for RW management and safety during stand pivot  transfer   Ambulation/Gait             General Gait Details: unable due to fatigue   Stairs            Wheelchair Mobility    Modified Rankin (Stroke Patients Only)       Balance Overall balance assessment: Mild deficits observed, not formally tested                                           Pertinent Vitals/Pain Pain Assessment: No/denies pain    Home Living Family/patient expects to be discharged to:: Private residence Living Arrangements: Spouse/significant other;Children Available Help at Discharge: Family;Available PRN/intermittently Type of Home: House Home Access: Stairs to enter Entrance Stairs-Rails: Can reach both Entrance Stairs-Number of Steps: 3 Home Layout: Two level;Bed/bath upstairs;Able to live on main level with bedroom/bathroom Home Equipment: Walker - 2 wheels Additional Comments: 2 kids ages 76 and 28 and wife goes to school during day    Prior Function Level of Independence: Independent with assistive device(s)         Comments: takes care of the house and enjoys driving kids to activities      Hand Dominance        Extremity/Trunk Assessment   Upper Extremity Assessment Upper Extremity Assessment: Defer to OT evaluation    Lower Extremity Assessment Lower Extremity Assessment: Generalized weakness    Cervical / Trunk Assessment Cervical / Trunk Assessment:  Normal  Communication   Communication: No difficulties  Cognition Arousal/Alertness: Awake/alert Behavior During Therapy: WFL for tasks assessed/performed Overall Cognitive Status: Within Functional Limits for tasks assessed                                        General Comments      Exercises     Assessment/Plan    PT Assessment Patient needs continued PT services  PT Problem List Decreased strength;Decreased mobility;Decreased safety awareness;Decreased coordination;Obesity;Decreased activity tolerance;Cardiopulmonary  status limiting activity;Decreased balance       PT Treatment Interventions DME instruction;Therapeutic activities;Gait training;Therapeutic exercise;Patient/family education;Stair training;Balance training;Functional mobility training;Neuromuscular re-education    PT Goals (Current goals can be found in the Care Plan section)  Acute Rehab PT Goals Patient Stated Goal: to get stronger, get back to baseline  PT Goal Formulation: With patient Time For Goal Achievement: 03/07/18 Potential to Achieve Goals: Good    Frequency Min 3X/week   Barriers to discharge        Co-evaluation               AM-PAC PT "6 Clicks" Daily Activity  Outcome Measure Difficulty turning over in bed (including adjusting bedclothes, sheets and blankets)?: A Little Difficulty moving from lying on back to sitting on the side of the bed? : A Little Difficulty sitting down on and standing up from a chair with arms (e.g., wheelchair, bedside commode, etc,.)?: A Little Help needed moving to and from a bed to chair (including a wheelchair)?: A Little Help needed walking in hospital room?: A Lot Help needed climbing 3-5 steps with a railing? : Total 6 Click Score: 15    End of Session Equipment Utilized During Treatment: Oxygen Activity Tolerance: Patient tolerated treatment well;Patient limited by fatigue Patient left: in chair;with call bell/phone within reach Nurse Communication: Mobility status PT Visit Diagnosis: Unsteadiness on feet (R26.81);Muscle weakness (generalized) (M62.81);Difficulty in walking, not elsewhere classified (R26.2)    Time: 5170-0174 PT Time Calculation (min) (ACUTE ONLY): 34 min   Charges:   PT Evaluation $PT Eval Low Complexity: 1 Low PT Treatments $Therapeutic Activity: 8-22 mins        Deniece Ree PT, DPT, CBIS  Supplemental Physical Therapist [place]    Pager [phone] Acute Rehab Office [phone]

## 2018-02-21 NOTE — Plan of Care (Signed)

## 2018-02-22 ENCOUNTER — Encounter (HOSPITAL_BASED_OUTPATIENT_CLINIC_OR_DEPARTMENT_OTHER): Payer: BC Managed Care – PPO

## 2018-02-22 ENCOUNTER — Encounter (HOSPITAL_COMMUNITY)
Admission: AD | Disposition: A | Discharge status: Rehabilitation Facility | Payer: Self-pay | Source: Ambulatory Visit | Attending: Internal Medicine

## 2018-02-22 ENCOUNTER — Encounter (HOSPITAL_COMMUNITY): Payer: Self-pay

## 2018-02-22 ENCOUNTER — Inpatient Hospital Stay (HOSPITAL_COMMUNITY): Payer: BC Managed Care – PPO | Admitting: Certified Registered Nurse Anesthetist

## 2018-02-22 HISTORY — PX: CARDIOVERSION: SHX1299

## 2018-02-22 LAB — GLUCOSE, CAPILLARY
GLUCOSE-CAPILLARY: 134 mg/dL — AB (ref 70–99)
GLUCOSE-CAPILLARY: 107 mg/dL — AB (ref 70–99)
GLUCOSE-CAPILLARY: 112 mg/dL — AB (ref 70–99)
GLUCOSE-CAPILLARY: 117 mg/dL — AB (ref 70–99)

## 2018-02-22 LAB — BASIC METABOLIC PANEL
Anion gap: 13 (ref 5–15)
BUN: 43 mg/dL — AB (ref 6–20)
CO2: 32 mmol/L (ref 22–32)
Calcium: 8.7 mg/dL — ABNORMAL LOW (ref 8.9–10.3)
Chloride: 93 mmol/L — ABNORMAL LOW (ref 98–111)
Creatinine, Ser: 1.97 mg/dL — ABNORMAL HIGH (ref 0.61–1.24)
GFR calc Af Amer: 43 mL/min — ABNORMAL LOW (ref >60)
GFR calc non Af Amer: 37 mL/min — ABNORMAL LOW (ref >60)
Glucose, Bld: 145 mg/dL — ABNORMAL HIGH (ref 70–99)
POTASSIUM: 3.6 mmol/L (ref 3.5–5.1)
SODIUM: 138 mmol/L (ref 135–145)

## 2018-02-22 SURGERY — CARDIOVERSION
Anesthesia: General

## 2018-02-22 MED ORDER — SODIUM CHLORIDE 0.9 % IV SOLN
INTRAVENOUS | Status: DC
  Administered 2018-02-22: 13:00:00 via INTRAVENOUS

## 2018-02-22 MED ORDER — PROPOFOL 10 MG/ML IV BOLUS
INTRAVENOUS | Status: DC | PRN
  Administered 2018-02-22: 30 mg via INTRAVENOUS
  Administered 2018-02-22 (×2): 20 mg via INTRAVENOUS

## 2018-02-22 MED ORDER — LIDOCAINE 2% (20 MG/ML) 5 ML SYRINGE
INTRAMUSCULAR | Status: DC | PRN
  Administered 2018-02-22: 80 mg via INTRAVENOUS

## 2018-02-22 MED ORDER — POTASSIUM CHLORIDE CRYS ER 20 MEQ PO TBCR
40.0000 meq | EXTENDED_RELEASE_TABLET | Freq: Every day | ORAL | Status: DC
  Administered 2018-02-22 – 2018-02-23 (×2): 40 meq via ORAL
  Filled 2018-02-22 (×2): qty 2

## 2018-02-22 NOTE — Plan of Care (Signed)
  Problem: Clinical Measurements: Goal: Will remain free from infection Outcome: Progressing   Problem: Activity: Goal: Risk for activity intolerance will decrease Outcome: Progressing   

## 2018-02-22 NOTE — H&P (View-Only) (Signed)
Advanced Heart Failure Rounding Note  PCP: [name], MD Primary Cardiologist: Dr [name]  Subjective:   Echo 12/2017 EF 20-25%  Sluggish UOP with IV lasix. Weight up 4 lbs. Creatinine 1.82 > 1.97  Yesterday PO amio was increased. He remains in Aflutter 100s.   Feels fine this morning. No CP or SOB. Walked a little in the room yesterday. Agreeable to CIR.   Plans for DCCV today at 1:30. All questions answered.   RHC/LHC  Ao = 125/87 (104) LV = 122/23  RA = 17  RV = 56/16 PA = 52/35 (42) PCW = 21 Fick cardiac output/index = 7.0.2.7 SVR = 993 PVR = 3.0 WU Ao sat = 92% PA sat = 66%, 65%  Assessment: 1. Normal coronaries 2. 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 Heart appears rotated 90% leftward on cath images 4. Moderate mixed pulmonary venous/arterial HTN  Objective:   Weight Range: (!) 156.2 kg Body mass index is 52.37 kg/m.   Vital Signs:   Temp:  [97.5 F (36.4 C)-98.6 F (37 C)] 98.2 F (36.8 C) (10/02 0515) Pulse Rate:  [55-98] 55 (10/02 0515) Resp:  [17-20] 18 (10/02 0515) BP: (105-123)/(57-104) 111/57 (10/02 0515) SpO2:  [90 %-99 %] 92 % (10/02 0515) Weight:  [156.2 kg] 156.2 kg (10/02 0042) Last BM Date: 02/21/18  Weight change: Filed Weights   02/20/18 0100 02/21/18 0420 02/22/18 0042  Weight: (!) 155.6 kg (!) 154.6 kg (!) 156.2 kg    Intake/Output:   Intake/Output Summary (Last 24 hours) at 02/22/2018 0756 Last data filed at 02/22/2018 0043 Gross per 24 hour  Intake 1043 ml  Output 900 ml  Net 143 ml      Physical Exam    General: No resp difficulty. Lying in bed. HEENT: Normal Neck: Supple. JVP difficult . Carotids 2+ bilat; no bruits. No thyromegaly or nodule noted. Cor: PMI nondisplaced. IRR, No M/G/R noted Lungs: distant, clear throughout Abdomen: Soft, non-tender, non-distended, no HSM. No bruits or masses. +BS  Extremities: No cyanosis, clubbing, or rash. R and LLE no edema.  Neuro: Alert & orientedx3, cranial  nerves grossly intact. moves all 4 extremities w/o difficulty. Affect pleasant  Telemetry   Aflutter 100s. Personally reviewed.    EKG    No new tracings.   Labs    CBC Recent Labs    02/20/18 0515 02/21/18 0424  WBC 10.1 7.5  HGB 13.3 13.1  HCT 41.8 41.9  MCV 101.0* 100.0  PLT 233 163   Basic Metabolic Panel Recent Labs    02/21/18 0424 02/22/18 0421  NA 142 138  K 3.5 3.6  CL 94* 93*  CO2 35* 32  GLUCOSE 122* 145*  BUN 37* 43*  CREATININE 1.82* 1.97*  CALCIUM 9.1 8.7*   Liver Function Tests No results for input(s): AST, ALT, ALKPHOS, BILITOT, PROT, ALBUMIN in the last 72 hours. No results for input(s): LIPASE, AMYLASE in the last 72 hours. Cardiac Enzymes No results for input(s): CKTOTAL, CKMB, CKMBINDEX, TROPONINI in the last 72 hours.  BNP: BNP (last 3 results) Recent Labs    12/27/17 2354 02/15/18 1559  BNP 283.3* 847.1*    ProBNP (last 3 results) No results for input(s): PROBNP in the last 8760 hours.   D-Dimer No results for input(s): DDIMER in the last 72 hours. Hemoglobin A1C No results for input(s): HGBA1C in the last 72 hours. Fasting Lipid Panel No results for input(s): CHOL, HDL, LDLCALC, TRIG, CHOLHDL, LDLDIRECT in the last 72  hours. Thyroid Function Tests No results for input(s): TSH, T4TOTAL, T3FREE, THYROIDAB in the last 72 hours.  Invalid input(s): FREET3  Other results:   Imaging    No results found.   Medications:     Scheduled Medications: . allopurinol  100 mg Oral Daily  . amiodarone  200 mg Oral BID  . aspirin EC  81 mg Oral Daily  . atorvastatin  80 mg Oral q1800  . furosemide  80 mg Intravenous BID  . hydrALAZINE  75 mg Oral Q8H  . insulin aspart  0-15 Units Subcutaneous TID WC  . insulin aspart  0-5 Units Subcutaneous QHS  . isosorbide mononitrate  30 mg Oral Daily  . metoprolol succinate  75 mg Oral Daily  . omega-3 acid ethyl esters  1 g Oral BID  . potassium chloride  40 mEq Oral BID  .  rivaroxaban  20 mg Oral Q supper  . sodium chloride flush  3 mL Intravenous Q12H  . sodium chloride flush  3 mL Intravenous Q12H    Infusions: . sodium chloride 10 mL/hr at 02/20/18 1016  . sodium chloride      PRN Medications: sodium chloride, sodium chloride, acetaminophen, guaiFENesin-dextromethorphan, ondansetron (ZOFRAN) IV, sodium chloride flush, sodium chloride flush    Patient Profile   Jay Chen is a 52 y.o. 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    Assessment/Plan   1. Acute on chronic combined CHF, presumed non-ischemic (no cath) - Echo 12/2017 EF 20-25% - R/LHC normal cors. RA 17 PCWP 21 - Overall he has diuresed 40 pounds.  - Volume status okay. Hold IV lasix with creatinine bump 1.82 > 1.97. May be able to switch to PO tomorrow. He was on lasix 40 mg daily PTA. - Continue Toprol 75  daily - Continue hydralazine 75 mg tid.  - Continue imdur 30 mg daily - No ARB/ARNi/spiro currently with CKD 3 - Consider adding spiro/Entresto. SBP now 100-110s. Will not add today with creatinine bump.   2. AKI on CKD III - Baseline Cr 1.7 - 1.9 - Creatinine 1.82 > 1.97. Hold IV lasix today.    3. Persistent Afib/Aflutter - New diagnosis 08/2017, Currently on rhythm control strategy. May be worth attempt at [facility] prior to d/c - Rate 90-100s - Continue amio to 200 mg twice a day.  - Continue  Toprol 75 mg daily.  - Continue Xarelto 20 mg daily - Plan for DCCV today. Has not missed any xarelto doses.    4. H/o CVA - Only remaining deficit.  - On Xarelto chronically.    5. HTN - Improved. SBP 110s this am.  - Cr borderline for ARB/ARNi/Spiro.    6. AAA  - 4.7 cm by echo 12/2017  - Will need  further imaging as creatinine permits.    7. OSA - Non-compliant with CPAP. Discussed importance of CPAP.   8. DM2 - CBG ok. Continue SSI - Hgb A1C 7.1 01/24/18   9. Morbid obesity -Body mass index is 52.37 kg/m. - Needs significant weight loss.  - Nutrition consult ordered  CIR following. Pt agreeable to CIR once ready for DC.  Length of Stay: [address],  NP  02/22/2018, 7:56 AM  Advanced Heart Failure Team Pager [phone] (M-F; [place])  Please contact [facility] for night-coverage after hours (4p -7a ) and weekends on [email or web address]  Patient seen and examined with the above-signed Advanced Practice Provider and/or Housestaff. I personally reviewed laboratory data, imaging studies and relevant notes. I independently examined the patient and formulated the important aspects of the plan. I have edited the note to reflect any of my changes or salient points. I have personally discussed the plan with the patient and/or family.  Volume status looks ok. Creatinine up slightly. 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 Agree with holding diuretics today. Plan DC-CV this afternoon. Continue Xarelto and amio. CIR following. May be ready for CIR tomorrow if renal function stabilizes.   Glori Bickers, MD  9:20 AM

## 2018-02-22 NOTE — CV Procedure (Signed)
    DIRECT CURRENT CARDIOVERSION  NAME:  Jay Chen   MRN: [id number] DOB:  [birth year]   ADMIT DATE: 02/15/2018   INDICATIONS: Atrial fibrillation    PROCEDURE:   Informed consent was obtained prior to the procedure. The risks, benefits and alternatives for the procedure were discussed and the patient comprehended these risks. Once an appropriate time out was taken, the patient had the defibrillator pads placed in the anterior and posterior position. The patient then underwent sedation by the anesthesia service. Once an appropriate level of sedation was achieved, the patient received a single biphasic, synchronized 200J shock with no effect. He then received a second 200J shock with manual pressure held on the anterior pad with prompt conversion to sinus rhythm. No apparent complications.  Glori Bickers, MD  1:53 PM

## 2018-02-22 NOTE — Anesthesia Preprocedure Evaluation (Addendum)
Anesthesia Evaluation  Patient identified by MRN, date of birth, ID band Patient awake    Reviewed: Allergy & Precautions, NPO status , Patient's Chart, lab work & pertinent test results, reviewed documented beta blocker date and time   Airway Mallampati: I  TM Distance: >3 FB Neck ROM: Full    Dental  (+) Dental Advisory Given, Poor Dentition   Pulmonary sleep apnea ,    Pulmonary exam normal breath sounds clear to auscultation       Cardiovascular hypertension, Pt. on home beta blockers and Pt. on medications +CHF  + dysrhythmias (A-fib) Atrial Fibrillation  Rhythm:Irregular Rate:Tachycardia  Echo 01/01/18: - Left ventricle: The cavity size was moderately to severely   dilated. Wall thickness was increased in a pattern of moderate LVH. Systolic function was severely reduced. The estimated ejection fraction was in the range of 20% to 25%. Diffuse hypokinesis. The study is not technically sufficient to allow evaluation of LV diastolic function. - Aortic valve: Mildly calcified annulus. Valve area (VTI): 4.07 cm^2. Valve area (Vmax): 4.14 cm^2. Valve area (Vmean): 4.16 cm^2. 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 From chart review a similar aneurysm was detected by CT scan 03/28/2012. Limited evaluation by echo, consider alternative modality if more complete evaluation   is indicated. - Left atrium: The atrium was severely dilated. - Right ventricle: The cavity size was mildly dilated. Systolic function was mildly reduced. - Right atrium: The atrium was mildly to moderately dilated. - Atrial septum: No defect or patent foramen ovale was identified. - Inferior vena cava: The vessel was dilated. The respirophasic diameter changes were blunted (< 50%), consistent with elevated central venous pressure.   Neuro/Psych  Neuromuscular disease CVA    GI/Hepatic negative GI ROS,   Endo/Other  diabetes, Type 2Morbid obesity  Renal/GU Renal InsufficiencyRenal disease     Musculoskeletal  (+) Arthritis , Osteoarthritis,    Abdominal   Peds  Hematology negative hematology ROS (+)   Anesthesia Other Findings Day of surgery medications reviewed with the patient.  Reproductive/Obstetrics                            Anesthesia Physical Anesthesia Plan  ASA: IV  Anesthesia Plan: General   Post-op Pain Management:    Induction: Intravenous  PONV Risk Score and Plan: 2 and Treatment may vary due to age or medical condition  Airway Management Planned: Mask  Additional Equipment:   Intra-op Plan:   Post-operative Plan:   Informed Consent: I have reviewed the patients History and Physical, chart, labs and discussed the procedure including the risks, benefits and alternatives for the proposed anesthesia with the patient or authorized representative who has indicated his/her understanding and acceptance.   Dental advisory given  Plan Discussed with: CRNA  Anesthesia Plan Comments:        Anesthesia Quick Evaluation

## 2018-02-22 NOTE — Anesthesia Procedure Notes (Signed)
Procedure Name: General with mask airway Date/Time: 02/22/2018 1:45 PM Performed by: Lowella Dell, CRNA Pre-anesthesia Checklist: Patient identified, Emergency Drugs available, Suction available, Patient being monitored and Timeout performed Patient Re-evaluated:Patient Re-evaluated prior to induction Oxygen Delivery Method: Ambu bag Preoxygenation: Pre-oxygenation with 100% oxygen Ventilation: Two handed mask ventilation required and Oral airway inserted - appropriate to patient size Placement Confirmation: positive ETCO2 Dental Injury: Teeth and Oropharynx as per pre-operative assessment

## 2018-02-22 NOTE — Interval H&P Note (Signed)
History and Physical Interval Note:  02/22/2018 1:44 PM  Jay Chen  has presented today for surgery, with the diagnosis of a fib  The various methods of treatment have been discussed with the patient and family. After consideration of risks, benefits and other options for treatment, the patient has consented to  Procedure(s): CARDIOVERSION (N/A) as a surgical intervention .  The patient's history has been reviewed, patient examined, no change in status, stable for surgery.  I have reviewed the patient's chart and labs.  Questions were answered to the patient's satisfaction.     Shaunn Tackitt

## 2018-02-22 NOTE — Progress Notes (Addendum)
Advanced Heart Failure Rounding Note  PCP: [name], MD Primary Cardiologist: Dr [name]  Subjective:   Echo 12/2017 EF 20-25%  Sluggish UOP with IV lasix. Weight up 4 lbs. Creatinine 1.82 > 1.97  Yesterday PO amio was increased. He remains in Aflutter 100s.   Feels fine this morning. No CP or SOB. Walked a little in the room yesterday. Agreeable to CIR.   Plans for DCCV today at 1:30. All questions answered.   RHC/LHC  Ao = 125/87 (104) LV = 122/23  RA = 17  RV = 56/16 PA = 52/35 (42) PCW = 21 Fick cardiac output/index = 7.0.2.7 SVR = 993 PVR = 3.0 WU Ao sat = 92% PA sat = 66%, 65%  Assessment: 1. Normal coronaries 2. 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 Heart appears rotated 90% leftward on cath images 4. Moderate mixed pulmonary venous/arterial HTN  Objective:   Weight Range: (!) 156.2 kg Body mass index is 52.37 kg/m.   Vital Signs:   Temp:  [97.5 F (36.4 C)-98.6 F (37 C)] 98.2 F (36.8 C) (10/02 0515) Pulse Rate:  [55-98] 55 (10/02 0515) Resp:  [17-20] 18 (10/02 0515) BP: (105-123)/(57-104) 111/57 (10/02 0515) SpO2:  [90 %-99 %] 92 % (10/02 0515) Weight:  [156.2 kg] 156.2 kg (10/02 0042) Last BM Date: 02/21/18  Weight change: Filed Weights   02/20/18 0100 02/21/18 0420 02/22/18 0042  Weight: (!) 155.6 kg (!) 154.6 kg (!) 156.2 kg    Intake/Output:   Intake/Output Summary (Last 24 hours) at 02/22/2018 0756 Last data filed at 02/22/2018 0043 Gross per 24 hour  Intake 1043 ml  Output 900 ml  Net 143 ml      Physical Exam    General: No resp difficulty. Lying in bed. HEENT: Normal Neck: Supple. JVP difficult . Carotids 2+ bilat; no bruits. No thyromegaly or nodule noted. Cor: PMI nondisplaced. IRR, No M/G/R noted Lungs: distant, clear throughout Abdomen: Soft, non-tender, non-distended, no HSM. No bruits or masses. +BS  Extremities: No cyanosis, clubbing, or rash. R and LLE no edema.  Neuro: Alert & orientedx3, cranial  nerves grossly intact. moves all 4 extremities w/o difficulty. Affect pleasant  Telemetry   Aflutter 100s. Personally reviewed.    EKG    No new tracings.   Labs    CBC Recent Labs    02/20/18 0515 02/21/18 0424  WBC 10.1 7.5  HGB 13.3 13.1  HCT 41.8 41.9  MCV 101.0* 100.0  PLT 233 431   Basic Metabolic Panel Recent Labs    02/21/18 0424 02/22/18 0421  NA 142 138  K 3.5 3.6  CL 94* 93*  CO2 35* 32  GLUCOSE 122* 145*  BUN 37* 43*  CREATININE 1.82* 1.97*  CALCIUM 9.1 8.7*   Liver Function Tests No results for input(s): AST, ALT, ALKPHOS, BILITOT, PROT, ALBUMIN in the last 72 hours. No results for input(s): LIPASE, AMYLASE in the last 72 hours. Cardiac Enzymes No results for input(s): CKTOTAL, CKMB, CKMBINDEX, TROPONINI in the last 72 hours.  BNP: BNP (last 3 results) Recent Labs    12/27/17 2354 02/15/18 1559  BNP 283.3* 847.1*    ProBNP (last 3 results) No results for input(s): PROBNP in the last 8760 hours.   D-Dimer No results for input(s): DDIMER in the last 72 hours. Hemoglobin A1C No results for input(s): HGBA1C in the last 72 hours. Fasting Lipid Panel No results for input(s): CHOL, HDL, LDLCALC, TRIG, CHOLHDL, LDLDIRECT in the last 72  hours. Thyroid Function Tests No results for input(s): TSH, T4TOTAL, T3FREE, THYROIDAB in the last 72 hours.  Invalid input(s): FREET3  Other results:   Imaging    No results found.   Medications:     Scheduled Medications: . allopurinol  100 mg Oral Daily  . amiodarone  200 mg Oral BID  . aspirin EC  81 mg Oral Daily  . atorvastatin  80 mg Oral q1800  . furosemide  80 mg Intravenous BID  . hydrALAZINE  75 mg Oral Q8H  . insulin aspart  0-15 Units Subcutaneous TID WC  . insulin aspart  0-5 Units Subcutaneous QHS  . isosorbide mononitrate  30 mg Oral Daily  . metoprolol succinate  75 mg Oral Daily  . omega-3 acid ethyl esters  1 g Oral BID  . potassium chloride  40 mEq Oral BID  .  rivaroxaban  20 mg Oral Q supper  . sodium chloride flush  3 mL Intravenous Q12H  . sodium chloride flush  3 mL Intravenous Q12H    Infusions: . sodium chloride 10 mL/hr at 02/20/18 1016  . sodium chloride      PRN Medications: sodium chloride, sodium chloride, acetaminophen, guaiFENesin-dextromethorphan, ondansetron (ZOFRAN) IV, sodium chloride flush, sodium chloride flush    Patient Profile   Jay Chen is a 52 y.o. 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    Assessment/Plan   1. Acute on chronic combined CHF, presumed non-ischemic (no cath) - Echo 12/2017 EF 20-25% - R/LHC normal cors. RA 17 PCWP 21 - Overall he has diuresed 40 pounds.  - Volume status okay. Hold IV lasix with creatinine bump 1.82 > 1.97. May be able to switch to PO tomorrow. He was on lasix 40 mg daily PTA. - Continue Toprol 75  daily - Continue hydralazine 75 mg tid.  - Continue imdur 30 mg daily - No ARB/ARNi/spiro currently with CKD 3 - Consider adding spiro/Entresto. SBP now 100-110s. Will not add today with creatinine bump.   2. AKI on CKD III - Baseline Cr 1.7 - 1.9 - Creatinine 1.82 > 1.97. Hold IV lasix today.    3. Persistent Afib/Aflutter - New diagnosis 08/2017, Currently on rhythm control strategy. May be worth attempt at [facility] prior to d/c - Rate 90-100s - Continue amio to 200 mg twice a day.  - Continue  Toprol 75 mg daily.  - Continue Xarelto 20 mg daily - Plan for DCCV today. Has not missed any xarelto doses.    4. H/o CVA - Only remaining deficit.  - On Xarelto chronically.    5. HTN - Improved. SBP 110s this am.  - Cr borderline for ARB/ARNi/Spiro.    6. AAA  - 4.7 cm by echo 12/2017  - Will need  further imaging as creatinine permits.    7. OSA - Non-compliant with CPAP. Discussed importance of CPAP.   8. DM2 - CBG ok. Continue SSI - Hgb A1C 7.1 01/24/18   9. Morbid obesity -Body mass index is 52.37 kg/m. - Needs significant weight loss.  - Nutrition consult ordered  CIR following. Pt agreeable to CIR once ready for DC.  Length of Stay: [address],  NP  02/22/2018, 7:56 AM  Advanced Heart Failure Team Pager [phone] (M-F; [place])  Please contact [facility] for night-coverage after hours (4p -7a ) and weekends on [email or web address]  Patient seen and examined with the above-signed Advanced Practice Provider and/or Housestaff. I personally reviewed laboratory data, imaging studies and relevant notes. I independently examined the patient and formulated the important aspects of the plan. I have edited the note to reflect any of my changes or salient points. I have personally discussed the plan with the patient and/or family.  Volume status looks ok. Creatinine up slightly. 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 Agree with holding diuretics today. Plan DC-CV this afternoon. Continue Xarelto and amio. CIR following. May be ready for CIR tomorrow if renal function stabilizes.   Glori Bickers, MD  9:20 AM

## 2018-02-22 NOTE — Progress Notes (Signed)
PT Cancellation Note  Patient Details Name: Jay Chen MRN: [id number] DOB: 01-Jan-1966   Cancelled Treatment:    Reason Eval/Treat Not Completed: Patient at procedure or test/unavailable   [name] 02/22/2018, 3:16 PM

## 2018-02-22 NOTE — Progress Notes (Signed)
[id number] Will let PT work with pt for strengthening. Gave pt CHF booklet and reviewed zones. Discussed importance of daily weights, 2000 mg sodium restriction and 1200 FR. Reviewed when to call MD with signs/symptoms. Gave pt OFF the Beat booklet and explained what atrial fib is and reasoning for anticoagulant. Gave [place] CRP 2 brochure in case pt feels he can do after he completed rehab.  He can discuss with cardiologist at that time. Gave low sodium diets. Graylon Good RN BSN 02/22/2018 11:19 AM

## 2018-02-22 NOTE — Progress Notes (Signed)
PT Cancellation Note  Patient Details Name: BRAXTEN MEMMER MRN: [id number] DOB: 1965-06-19   Cancelled Treatment:    Reason Eval/Treat Not Completed: Other (comment).  Pt scheduled for cardioversion this afternoon and electing to attempt PT following procedure.  Will f/u per POC.    [name] 02/22/2018, 11:34 AM

## 2018-02-22 NOTE — Transfer of Care (Signed)
Immediate Anesthesia Transfer of Care Note  Patient: Jay Chen  Procedure(s) Performed: CARDIOVERSION (N/A )  Patient Location: PACU and Endoscopy Unit  Anesthesia Type:General  Level of Consciousness: drowsy and patient cooperative  Airway & Oxygen Therapy: Patient Spontanous Breathing and Patient connected to nasal cannula oxygen  Post-op Assessment: Report given to RN, Post -op Vital signs reviewed and stable and Patient moving all extremities  Post vital signs: Reviewed and stable  Last Vitals:  Vitals Value Taken Time  BP    Temp    Pulse    Resp    SpO2      Last Pain:  Vitals:   02/22/18 1303  TempSrc: Oral  PainSc: 0-No pain      Patients Stated Pain Goal: 2 (02/66/91 6756)  Complications: No apparent anesthesia complications

## 2018-02-23 ENCOUNTER — Inpatient Hospital Stay (HOSPITAL_COMMUNITY)
Admission: RE | Admit: 2018-02-23 | Discharge: 2018-03-01 | DRG: 945 | Disposition: A | Discharge status: Home Health | Payer: BC Managed Care – PPO | Source: Intra-hospital | Attending: Physical Medicine & Rehabilitation | Admitting: Physical Medicine & Rehabilitation

## 2018-02-23 ENCOUNTER — Encounter (HOSPITAL_COMMUNITY): Payer: Self-pay | Admitting: Internal Medicine

## 2018-02-23 DIAGNOSIS — Z7982 Long term (current) use of aspirin: Secondary | ICD-10-CM | POA: Diagnosis not present

## 2018-02-23 DIAGNOSIS — I2781 Cor pulmonale (chronic): Secondary | ICD-10-CM | POA: Diagnosis present

## 2018-02-23 DIAGNOSIS — I5042 Chronic combined systolic (congestive) and diastolic (congestive) heart failure: Secondary | ICD-10-CM | POA: Diagnosis present

## 2018-02-23 DIAGNOSIS — G992 Myelopathy in diseases classified elsewhere: Secondary | ICD-10-CM | POA: Diagnosis present

## 2018-02-23 DIAGNOSIS — I428 Other cardiomyopathies: Secondary | ICD-10-CM | POA: Diagnosis not present

## 2018-02-23 DIAGNOSIS — E1122 Type 2 diabetes mellitus with diabetic chronic kidney disease: Secondary | ICD-10-CM | POA: Diagnosis present

## 2018-02-23 DIAGNOSIS — I130 Hypertensive heart and chronic kidney disease with heart failure and stage 1 through stage 4 chronic kidney disease, or unspecified chronic kidney disease: Secondary | ICD-10-CM | POA: Diagnosis present

## 2018-02-23 DIAGNOSIS — I493 Ventricular premature depolarization: Secondary | ICD-10-CM | POA: Diagnosis present

## 2018-02-23 DIAGNOSIS — I5043 Acute on chronic combined systolic (congestive) and diastolic (congestive) heart failure: Secondary | ICD-10-CM | POA: Diagnosis not present

## 2018-02-23 DIAGNOSIS — Z9049 Acquired absence of other specified parts of digestive tract: Secondary | ICD-10-CM

## 2018-02-23 DIAGNOSIS — R5381 Other malaise: Principal | ICD-10-CM | POA: Diagnosis present

## 2018-02-23 DIAGNOSIS — Z7952 Long term (current) use of systemic steroids: Secondary | ICD-10-CM

## 2018-02-23 DIAGNOSIS — N179 Acute kidney failure, unspecified: Secondary | ICD-10-CM | POA: Diagnosis not present

## 2018-02-23 DIAGNOSIS — E1169 Type 2 diabetes mellitus with other specified complication: Secondary | ICD-10-CM | POA: Diagnosis not present

## 2018-02-23 DIAGNOSIS — Z8673 Personal history of transient ischemic attack (TIA), and cerebral infarction without residual deficits: Secondary | ICD-10-CM

## 2018-02-23 DIAGNOSIS — G4733 Obstructive sleep apnea (adult) (pediatric): Secondary | ICD-10-CM | POA: Diagnosis present

## 2018-02-23 DIAGNOSIS — Z6841 Body Mass Index (BMI) 40.0 and over, adult: Secondary | ICD-10-CM | POA: Diagnosis not present

## 2018-02-23 DIAGNOSIS — Z79899 Other long term (current) drug therapy: Secondary | ICD-10-CM | POA: Diagnosis not present

## 2018-02-23 DIAGNOSIS — E785 Hyperlipidemia, unspecified: Secondary | ICD-10-CM | POA: Diagnosis not present

## 2018-02-23 DIAGNOSIS — Z9119 Patient's noncompliance with other medical treatment and regimen: Secondary | ICD-10-CM

## 2018-02-23 DIAGNOSIS — Z87442 Personal history of urinary calculi: Secondary | ICD-10-CM | POA: Diagnosis not present

## 2018-02-23 DIAGNOSIS — M4726 Other spondylosis with radiculopathy, lumbar region: Secondary | ICD-10-CM | POA: Diagnosis present

## 2018-02-23 DIAGNOSIS — E46 Unspecified protein-calorie malnutrition: Secondary | ICD-10-CM

## 2018-02-23 DIAGNOSIS — K5900 Constipation, unspecified: Secondary | ICD-10-CM | POA: Diagnosis present

## 2018-02-23 DIAGNOSIS — I4892 Unspecified atrial flutter: Secondary | ICD-10-CM | POA: Diagnosis present

## 2018-02-23 DIAGNOSIS — I714 Abdominal aortic aneurysm, without rupture: Secondary | ICD-10-CM | POA: Diagnosis present

## 2018-02-23 DIAGNOSIS — D62 Acute posthemorrhagic anemia: Secondary | ICD-10-CM

## 2018-02-23 DIAGNOSIS — Z955 Presence of coronary angioplasty implant and graft: Secondary | ICD-10-CM | POA: Diagnosis not present

## 2018-02-23 DIAGNOSIS — M109 Gout, unspecified: Secondary | ICD-10-CM | POA: Diagnosis present

## 2018-02-23 DIAGNOSIS — Z7901 Long term (current) use of anticoagulants: Secondary | ICD-10-CM

## 2018-02-23 DIAGNOSIS — R030 Elevated blood-pressure reading, without diagnosis of hypertension: Secondary | ICD-10-CM | POA: Diagnosis not present

## 2018-02-23 DIAGNOSIS — E8809 Other disorders of plasma-protein metabolism, not elsewhere classified: Secondary | ICD-10-CM | POA: Diagnosis present

## 2018-02-23 DIAGNOSIS — M4802 Spinal stenosis, cervical region: Secondary | ICD-10-CM | POA: Diagnosis present

## 2018-02-23 DIAGNOSIS — I4891 Unspecified atrial fibrillation: Secondary | ICD-10-CM | POA: Diagnosis not present

## 2018-02-23 DIAGNOSIS — I480 Paroxysmal atrial fibrillation: Secondary | ICD-10-CM | POA: Diagnosis not present

## 2018-02-23 DIAGNOSIS — Z560 Unemployment, unspecified: Secondary | ICD-10-CM

## 2018-02-23 DIAGNOSIS — N183 Chronic kidney disease, stage 3 unspecified: Secondary | ICD-10-CM

## 2018-02-23 DIAGNOSIS — E669 Obesity, unspecified: Secondary | ICD-10-CM | POA: Diagnosis not present

## 2018-02-23 DIAGNOSIS — E1142 Type 2 diabetes mellitus with diabetic polyneuropathy: Secondary | ICD-10-CM | POA: Diagnosis present

## 2018-02-23 DIAGNOSIS — Z794 Long term (current) use of insulin: Secondary | ICD-10-CM | POA: Diagnosis not present

## 2018-02-23 DIAGNOSIS — E1140 Type 2 diabetes mellitus with diabetic neuropathy, unspecified: Secondary | ICD-10-CM | POA: Diagnosis present

## 2018-02-23 DIAGNOSIS — R2689 Other abnormalities of gait and mobility: Secondary | ICD-10-CM | POA: Diagnosis present

## 2018-02-23 DIAGNOSIS — M1990 Unspecified osteoarthritis, unspecified site: Secondary | ICD-10-CM | POA: Diagnosis not present

## 2018-02-23 DIAGNOSIS — I10 Essential (primary) hypertension: Secondary | ICD-10-CM

## 2018-02-23 DIAGNOSIS — I5023 Acute on chronic systolic (congestive) heart failure: Secondary | ICD-10-CM

## 2018-02-23 DIAGNOSIS — Z8249 Family history of ischemic heart disease and other diseases of the circulatory system: Secondary | ICD-10-CM

## 2018-02-23 DIAGNOSIS — I4819 Other persistent atrial fibrillation: Secondary | ICD-10-CM | POA: Diagnosis not present

## 2018-02-23 DIAGNOSIS — E876 Hypokalemia: Secondary | ICD-10-CM

## 2018-02-23 DIAGNOSIS — Z713 Dietary counseling and surveillance: Secondary | ICD-10-CM

## 2018-02-23 DIAGNOSIS — Z7984 Long term (current) use of oral hypoglycemic drugs: Secondary | ICD-10-CM

## 2018-02-23 LAB — BASIC METABOLIC PANEL
Anion gap: 7 (ref 5–15)
BUN: 45 mg/dL — AB (ref 6–20)
CO2: 37 mmol/L — AB (ref 22–32)
CREATININE: 2.12 mg/dL — AB (ref 0.61–1.24)
Calcium: 8.7 mg/dL — ABNORMAL LOW (ref 8.9–10.3)
Chloride: 97 mmol/L — ABNORMAL LOW (ref 98–111)
GFR calc Af Amer: 39 mL/min — ABNORMAL LOW (ref >60)
GFR calc non Af Amer: 34 mL/min — ABNORMAL LOW (ref >60)
GLUCOSE: 128 mg/dL — AB (ref 70–99)
Potassium: 3.7 mmol/L (ref 3.5–5.1)
SODIUM: 141 mmol/L (ref 135–145)

## 2018-02-23 LAB — GLUCOSE, CAPILLARY
GLUCOSE-CAPILLARY: 129 mg/dL — AB (ref 70–99)
Glucose-Capillary: 131 mg/dL — ABNORMAL HIGH (ref 70–99)
Glucose-Capillary: 131 mg/dL — ABNORMAL HIGH (ref 70–99)
Glucose-Capillary: 85 mg/dL (ref 70–99)

## 2018-02-23 MED ORDER — INSULIN ASPART 100 UNIT/ML ~~LOC~~ SOLN: 0.0000 [IU] | Freq: Three times a day (TID) | SUBCUTANEOUS | 11 refills | Status: DC

## 2018-02-23 MED ORDER — DIPHENHYDRAMINE HCL 12.5 MG/5ML PO ELIX: 12.5000 mg | ORAL_SOLUTION | Freq: Four times a day (QID) | ORAL | Status: DC | PRN

## 2018-02-23 MED ORDER — ASPIRIN 81 MG PO TBEC: 81.0000 mg | DELAYED_RELEASE_TABLET | Freq: Every day | ORAL | Status: DC

## 2018-02-23 MED ORDER — AMIODARONE HCL 200 MG PO TABS: 200.0000 mg | ORAL_TABLET | Freq: Two times a day (BID) | ORAL | Status: DC

## 2018-02-23 MED ORDER — BISACODYL 10 MG RE SUPP: 10.0000 mg | Freq: Every day | RECTAL | Status: DC | PRN

## 2018-02-23 MED ORDER — ISOSORBIDE MONONITRATE ER 30 MG PO TB24: 30.0000 mg | ORAL_TABLET | Freq: Every day | ORAL | Status: DC

## 2018-02-23 MED ORDER — PRO-STAT SUGAR FREE PO LIQD
30.0000 mL | Freq: Two times a day (BID) | ORAL | Status: DC
  Administered 2018-02-23 – 2018-03-01 (×12): 30 mL via ORAL
  Filled 2018-02-23 (×12): qty 30

## 2018-02-23 MED ORDER — HYDRALAZINE HCL 50 MG PO TABS
75.0000 mg | ORAL_TABLET | Freq: Three times a day (TID) | ORAL | Status: DC
  Administered 2018-02-23 – 2018-03-01 (×16): 75 mg via ORAL
  Filled 2018-02-23 (×17): qty 1

## 2018-02-23 MED ORDER — METOPROLOL SUCCINATE ER 25 MG PO TB24: 75.0000 mg | ORAL_TABLET | Freq: Every day | ORAL | Status: DC

## 2018-02-23 MED ORDER — PROCHLORPERAZINE 25 MG RE SUPP: 12.5000 mg | Freq: Four times a day (QID) | RECTAL | Status: DC | PRN

## 2018-02-23 MED ORDER — METOPROLOL SUCCINATE ER 50 MG PO TB24
75.0000 mg | ORAL_TABLET | Freq: Every day | ORAL | Status: DC
  Administered 2018-02-24 – 2018-03-01 (×5): 75 mg via ORAL
  Filled 2018-02-23 (×6): qty 1

## 2018-02-23 MED ORDER — TRAZODONE HCL 50 MG PO TABS: 25.0000 mg | ORAL_TABLET | Freq: Every evening | ORAL | Status: DC | PRN

## 2018-02-23 MED ORDER — ALLOPURINOL 100 MG PO TABS
100.0000 mg | ORAL_TABLET | Freq: Every day | ORAL | Status: DC
  Administered 2018-02-24 – 2018-03-01 (×6): 100 mg via ORAL
  Filled 2018-02-23 (×6): qty 1

## 2018-02-23 MED ORDER — ISOSORBIDE MONONITRATE ER 30 MG PO TB24
30.0000 mg | ORAL_TABLET | Freq: Every day | ORAL | Status: DC
  Administered 2018-02-24 – 2018-03-01 (×5): 30 mg via ORAL
  Filled 2018-02-23 (×6): qty 1

## 2018-02-23 MED ORDER — RIVAROXABAN 20 MG PO TABS
20.0000 mg | ORAL_TABLET | Freq: Every day | ORAL | Status: DC
  Administered 2018-02-24 – 2018-02-28 (×5): 20 mg via ORAL
  Filled 2018-02-23 (×5): qty 1

## 2018-02-23 MED ORDER — GUAIFENESIN-DM 100-10 MG/5ML PO SYRP: 5.0000 mL | ORAL_SOLUTION | ORAL | 0 refills | Status: DC | PRN

## 2018-02-23 MED ORDER — PROCHLORPERAZINE EDISYLATE 10 MG/2ML IJ SOLN: 5.0000 mg | Freq: Four times a day (QID) | INTRAMUSCULAR | Status: DC | PRN

## 2018-02-23 MED ORDER — GUAIFENESIN-DM 100-10 MG/5ML PO SYRP: 5.0000 mL | ORAL_SOLUTION | Freq: Four times a day (QID) | ORAL | Status: DC | PRN

## 2018-02-23 MED ORDER — POTASSIUM CHLORIDE CRYS ER 20 MEQ PO TBCR
40.0000 meq | EXTENDED_RELEASE_TABLET | Freq: Every day | ORAL | Status: DC
  Administered 2018-02-24 – 2018-03-01 (×6): 40 meq via ORAL
  Filled 2018-02-23 (×6): qty 2

## 2018-02-23 MED ORDER — POLYETHYLENE GLYCOL 3350 17 G PO PACK: 17.0000 g | PACK | Freq: Every day | ORAL | Status: DC | PRN

## 2018-02-23 MED ORDER — ALUM & MAG HYDROXIDE-SIMETH 200-200-20 MG/5ML PO SUSP: 30.0000 mL | ORAL | Status: DC | PRN

## 2018-02-23 MED ORDER — ASPIRIN EC 81 MG PO TBEC
81.0000 mg | DELAYED_RELEASE_TABLET | Freq: Every day | ORAL | Status: DC
  Administered 2018-02-24 – 2018-03-01 (×6): 81 mg via ORAL
  Filled 2018-02-23 (×6): qty 1

## 2018-02-23 MED ORDER — HYDRALAZINE HCL 25 MG PO TABS: 75.0000 mg | ORAL_TABLET | Freq: Three times a day (TID) | ORAL | Status: DC

## 2018-02-23 MED ORDER — ONDANSETRON HCL 4 MG/2ML IJ SOLN: 4.0000 mg | Freq: Four times a day (QID) | INTRAMUSCULAR | 0 refills | Status: DC | PRN

## 2018-02-23 MED ORDER — AMIODARONE HCL 200 MG PO TABS
200.0000 mg | ORAL_TABLET | Freq: Two times a day (BID) | ORAL | Status: DC
  Administered 2018-02-23 – 2018-03-01 (×12): 200 mg via ORAL
  Filled 2018-02-23 (×12): qty 1

## 2018-02-23 MED ORDER — OMEGA-3-ACID ETHYL ESTERS 1 G PO CAPS
1.0000 g | ORAL_CAPSULE | Freq: Two times a day (BID) | ORAL | Status: DC
  Administered 2018-02-23 – 2018-03-01 (×12): 1 g via ORAL
  Filled 2018-02-23 (×12): qty 1

## 2018-02-23 MED ORDER — INSULIN ASPART 100 UNIT/ML ~~LOC~~ SOLN: 0.0000 [IU] | Freq: Every day | SUBCUTANEOUS | 11 refills | Status: DC

## 2018-02-23 MED ORDER — ATORVASTATIN CALCIUM 80 MG PO TABS
80.0000 mg | ORAL_TABLET | Freq: Every day | ORAL | Status: DC
  Administered 2018-02-24 – 2018-02-28 (×5): 80 mg via ORAL
  Filled 2018-02-23 (×5): qty 1

## 2018-02-23 MED ORDER — FLEET ENEMA 7-19 GM/118ML RE ENEM: 1.0000 | ENEMA | Freq: Once | RECTAL | Status: DC | PRN

## 2018-02-23 MED ORDER — ACETAMINOPHEN 325 MG PO TABS
325.0000 mg | ORAL_TABLET | ORAL | Status: DC | PRN
  Administered 2018-02-25: 650 mg via ORAL
  Filled 2018-02-23: qty 2

## 2018-02-23 MED ORDER — INSULIN ASPART 100 UNIT/ML ~~LOC~~ SOLN
0.0000 [IU] | Freq: Three times a day (TID) | SUBCUTANEOUS | Status: DC
  Administered 2018-02-24 – 2018-03-01 (×4): 2 [IU] via SUBCUTANEOUS

## 2018-02-23 MED ORDER — INSULIN ASPART 100 UNIT/ML ~~LOC~~ SOLN: 0.0000 [IU] | Freq: Every day | SUBCUTANEOUS | Status: DC

## 2018-02-23 MED ORDER — PROCHLORPERAZINE MALEATE 5 MG PO TABS: 5.0000 mg | ORAL_TABLET | Freq: Four times a day (QID) | ORAL | Status: DC | PRN

## 2018-02-23 NOTE — Progress Notes (Signed)
Physical Medicine and Rehabilitation Consult   Reason for Consult: Functional decline due to acute exacerbation of CHF Referring Physician: Dr. [name]   HPI: Jay Chen is a 52 y.o. male with history of CKD, HTN, morbid obesity, OSA, AAA-4.7 cm, A. fib, L-CVA with CIR 12/2017,  Severe cord compression with cervical myelopathy, chronic systolic CHF who was admitted on 02/15/2018 with acute on chronic CHF, weight gain of 27 lbs, LLE edema and worsening dyspnea.  He was started on with IV diuresis with weight down by 37 lbs.  Dr. [name] consulted for management of CHF and he underwent cardiac catheterization yesterday revealing EF 20-25% with normal coronaries.  Amiodarone increased to bid due to persistent A. fib and question plans for DC-CV prior to discharge. PT evaluation done today and patient limited by fatigue and poor safety awareness. CIR recommended due to functional decline.    Review of Systems  Constitutional: Negative for chills and fever.  HENT: Negative for hearing loss and tinnitus.   Eyes: Negative for blurred vision.  Respiratory: Negative for shortness of breath and wheezing.   Cardiovascular: Negative for chest pain and palpitations.  Gastrointestinal: Negative for abdominal pain, constipation, heartburn and nausea.  Genitourinary: Negative for dysuria and urgency.  Musculoskeletal: Positive for back pain, joint pain (left hip) and myalgias.  Neurological: Positive for focal weakness. Negative for dizziness and headaches.  Psychiatric/Behavioral: Negative for memory loss. The patient does not have insomnia.           Past Medical History:  Diagnosis Date  . CKD (chronic kidney disease), stage III ([place])   . Congestive heart failure [facility]) May of 2011   Felt to have cor pulmonale; EF 45 to 50% from echo in May 2011  . Cor pulmonale (chronic) ([place])   . Gout    "take daily RX" (02/15/2018)  . History of kidney stones   . Hyperlipidemia   .  Hypertension   . Morbid obesity ([place])   . Persistent atrial fibrillation    Jay Chen 12/28/2017  . Sleep apnea    "dx'd; couldn't tolerate CPAP" (02/15/2018)  . Thoracic aneurysm    a. 4.8cm thoracic aortic aneurysm by CT 2013.  [name] Thoracic aortic aneurysm ([place])    known/notes 12/28/2017  . Type II diabetes mellitus ([place])          Past Surgical History:  Procedure Laterality Date  . LAPAROSCOPIC CHOLECYSTECTOMY  2000  . RIGHT/LEFT HEART CATH AND CORONARY ANGIOGRAPHY N/A 02/20/2018   Procedure: RIGHT/LEFT HEART CATH AND CORONARY ANGIOGRAPHY;  Surgeon: Jay Artist, MD;  Location: [facility];  Service: Cardiovascular;  Laterality: N/A;  . US ECHOCARDIOGRAPHY  09/21/2009   EF 45-50%; Cavity size was severely dilated, severe concentric hypertrophy and normal wall motion         Family History  Problem Relation Age of Onset  . Hypertension Mother   . Pancreatic cancer Father   . Prostate cancer Father   . Colon cancer Father     Social History: Jay Chen works day. He was inndependent with ADLs and ambulating with rolling walker --family checks in during the day.  He reports that he has never smoked. He has never used smokeless tobacco. He reports that he quit alcohol use a couple of years ago. He reports that he does not use drugs.    Allergies: No Known Allergies          Medications Prior to Admission  Medication Sig Dispense Refill  . acetaminophen (TYLENOL)  325 MG tablet Take 2 tablets (650 mg total) by mouth every 6 (six) hours as needed for mild pain or headache (or Fever >/= 101).    [name] allopurinol (ZYLOPRIM) 100 MG tablet Take 1 tablet (100 mg total) by mouth daily. 90 tablet 0  . amiodarone (PACERONE) 200 MG tablet Take 1 tablet (200 mg total) by mouth daily. 30 tablet 1  . atorvastatin (LIPITOR) 80 MG tablet Take 1 tablet (80 mg total) by mouth daily. 30 tablet 1  . furosemide (LASIX) 40 MG tablet Take 1 tablet (40 mg total)  by mouth daily. 90 tablet 1  . magnesium oxide (MAG-OX) 400 MG tablet Take 1 tablet (400 mg total) by mouth daily. 90 tablet 1  . methocarbamol (ROBAXIN) 500 MG tablet Take 1 tablet (500 mg total) by mouth 3 (three) times daily. (Patient taking differently: Take 500 mg by mouth 2 (two) times daily. ) 90 tablet 0  . metoprolol succinate (TOPROL-XL) 100 MG 24 hr tablet Take one and one-half tablets (14m) by mouth daily. Take with or immediately following a meal. 135 tablet 3  . omega-3 acid ethyl esters (LOVAZA) 1 g capsule Take 2 capsules (2 g total) by mouth 2 (two) times daily. 120 capsule 11  . potassium chloride SA (KLOR-CON M20) 20 MEQ tablet Take 2 tablets (40 mEq total) by mouth daily. 180 tablet 1  . rivaroxaban (XARELTO) 20 MG TABS tablet Take 1 tablet (20 mg total) by mouth daily with supper. 90 tablet 1  . Blood Glucose Monitoring Suppl (ACCU-CHEK GUIDE) w/Device KIT 1 Act by Does not apply route 3 (three) times daily. 2 kit 0  . glucose blood (ACCU-CHEK GUIDE) test strip 1 each by Other route 3 (three) times daily. Use as instructed 300 each 1    Home: Home Living Family/patient expects to be discharged to:: Private residence Living Arrangements: Spouse/significant other, Children Available Help at Discharge: Family, Available PRN/intermittently Type of Home: House Home Access: Stairs to enter ETechnical brewerof Steps: 3 Entrance Stairs-Rails: Can reach both Home Layout: Two level, Bed/bath upstairs, Able to live on main level with bedroom/bathroom Alternate Level Stairs-Number of Steps: 20 Alternate Level Stairs-Rails: Left Bathroom Shower/Tub: Tub/shower unit, CArchitectural technologist Standard Bathroom Accessibility: Yes Home Equipment: WEnvironmental consultant- 2 wheels Additional Comments: 2 kids ages 148and 118and wife goes to school during day  Functional History: Prior Function Level of Independence: Independent with assistive device(s) Comments: takes care of the house and  enjoys driving kids to activities  Functional Status:  Mobility: Bed Mobility Overal bed mobility: Needs Assistance Bed Mobility: Supine to Sit Supine to sit: Min guard, HOB elevated General bed mobility comments: S and increased time, cues for safety  Transfers Overall transfer level: Needs assistance Equipment used: Rolling walker (2 wheeled) Transfers: Sit to/from Stand, SW.W. Grainger IncTransfers Sit to Stand: Min guard Stand pivot transfers: Min assist General transfer comment: Min guard and Mod VC for safety with RW; MinA for RW management and safety during stand pivot transfer  Ambulation/Gait General Gait Details: unable due to fatigue   ADL:  Cognition: Cognition Overall Cognitive Status: Within Functional Limits for tasks assessed Cognition Arousal/Alertness: Awake/alert Behavior During Therapy: WFL for tasks assessed/performed Overall Cognitive Status: Within Functional Limits for tasks assessed  Blood pressure 106/77, pulse 60, temperature 98.4 F (36.9 C), temperature source Oral, resp. rate 18, height _0  (1.727 m), weight (!) 154.6 kg, SpO2 94 %. Physical Exam  Nursing note and vitals reviewed. Constitutional:  Morbidly obese  HENT:  Head: Normocephalic.  Eyes: Pupils are equal, round, and reactive to light.  Neck: Normal range of motion.  Cardiovascular: Normal rate. An irregularly irregular rhythm present.  Respiratory: Effort normal. He has decreased breath sounds in the right lower field and the left lower field.  GI: Soft.  Musculoskeletal: He exhibits edema.  Keeps LLE rotated outwards with weakness at hip/knee. 2+ edema LLE > RLE. Foam dressings bilateral shins.   Neurological: He is alert.  UE 4/5 prox to distal. RLE 3-4/5 prox to distal. LLE 2-3/5 prox to distal. Decreased sense to LT/PP in distal lower ext.   Psychiatric: He has a normal mood and affect. His behavior is normal.  Assessment/Plan: Diagnosis: 52 yo male with hx of cervical  spondylosis and myelopathy admitted for substantial fluid overload. Pt with subsequent deconditioning in the setting of his premorbid gait deficits 1. Does the need for close, 24 hr/day medical supervision in concert with the patient's rehab needs make it unreasonable for this patient to be served in a less intensive setting? Yes 2. Co-Morbidities requiring supervision/potential complications: fluid/volume mgt, CKD, HTN 3. Due to bladder management, bowel management, safety, skin/wound care, disease management, medication administration, pain management and patient education, does the patient require 24 hr/day rehab nursing? Yes 4. Does the patient require coordinated care of a physician, rehab nurse, PT (1-2 hrs/day, 5 days/week) and OT (1-2 hrs/day, 5 days/week) to address physical and functional deficits in the context of the above medical diagnosis(es)? Yes Addressing deficits in the following areas: balance, endurance, locomotion, strength, transferring, bowel/bladder control, bathing, dressing, feeding, grooming, toileting and psychosocial support 5. Can the patient actively participate in an intensive therapy program of at least 3 hrs of therapy per day at least 5 days per week? Yes 6. The potential for patient to make measurable gains while on inpatient rehab is excellent 7. Anticipated functional outcomes upon discharge from inpatient rehab are modified independent  with PT, modified independent and supervision with OT, n/a with SLP. 8. Estimated rehab length of stay to reach the above functional goals is: 7 days 9. Anticipated D/C setting: Home 10. Anticipated post D/C treatments: [place] therapy 11. Overall Rehab/Functional Prognosis: excellent  RECOMMENDATIONS: This patient's condition is appropriate for continued rehabilitative care in the following setting: CIR Patient has agreed to participate in recommended program. Yes Note that insurance prior authorization may be required for  reimbursement for recommended care.  Comment: Rehab Admissions Coordinator to follow up.  Thanks,  Jay Staggers, MD, Jay Chen  I have personally performed a face to face diagnostic evaluation of this patient. Additionally, I have reviewed and concur with the physician assistant's documentation above.    Jay Leriche, PA-C 02/21/2018        Revision History                        Routing History

## 2018-02-23 NOTE — Evaluation (Signed)
Occupational Therapy Evaluation Patient Details Name: Jay Chen MRN: [id number] DOB: 11/18/1965 Today's Date: 02/23/2018    History of Present Illness 52yo male admitted from [facility] clinic on 02/15/18 due to acute on chronic CHF and volume overload, DCCV 10/2. PMH AAA, CVA, HTN, DM, CKD, morbid obesity, EF 20-25%    Clinical Impression   Pt typically functions modified independently in mobility, ADL and IADL. He presents with generalized weakness, decreased activity tolerance and impaired standing balance. Pt currently requires 2L 02. He needs set up to min assist for ADL. Pt has excellent potential to return to modified independence with intensive rehab in CIR. Will follow acutely.    Follow Up Recommendations  CIR    Equipment Recommendations  3 in 1 bedside commode(bariatric)    Recommendations for Other Services       Precautions / Restrictions Precautions Precautions: Fall Restrictions Weight Bearing Restrictions: No      Mobility Bed Mobility      General bed mobility comments: pt received in chair  Transfers Overall transfer level: Needs assistance Equipment used: Rolling walker (2 wheeled)(02--2L) Transfers: Sit to/from Stand Sit to Stand: Min guard         General transfer comment: min guard from recliner and BSC with use of B UEs to push up    Balance Overall balance assessment: Needs assistance Sitting-balance support: Bilateral upper extremity supported Sitting balance-Leahy Scale: Good Sitting balance - Comments: no LOB with donning socks     Standing balance-Leahy Scale: Poor Standing balance comment: needs external support when releasing walker in static standing to manage pants                           ADL either performed or assessed with clinical judgement   ADL Overall ADL's : Needs assistance/impaired Eating/Feeding: Independent;Sitting   Grooming: Standing;Min guard;Wash/dry hands;Brushing hair   Upper Body Bathing:  Set up;Sitting Upper Body Bathing Details (indicate cue type and reason): with long bath sponge Lower Body Bathing: Minimal assistance;Sit to/from stand Lower Body Bathing Details (indicate cue type and reason): with long handled bath sponge Upper Body Dressing : Set up;Sitting   Lower Body Dressing: Minimal assistance;Sit to/from stand Lower Body Dressing Details (indicate cue type and reason): can don socks with increased time, assist for balance with standing Toilet Transfer: Min guard;Ambulation;RW;BSC   Toileting- Clothing Manipulation and Hygiene: Maximal assistance;Sit to/from stand       Functional mobility during ADLs: Min guard;+2 for safety/equipment;Rolling walker General ADL Comments: Began education in energy conservation strategies.     Vision Baseline Vision/History: Wears glasses Wears Glasses: Reading only Patient Visual Report: No change from baseline       Perception     Praxis      Pertinent Vitals/Pain Pain Assessment: No/denies pain     Hand Dominance Right   Extremity/Trunk Assessment Upper Extremity Assessment Upper Extremity Assessment: Overall WFL for tasks assessed   Lower Extremity Assessment Lower Extremity Assessment: Defer to PT evaluation   Cervical / Trunk Assessment Cervical / Trunk Assessment: (obesity)   Communication Communication Communication: No difficulties   Cognition Arousal/Alertness: Awake/alert Behavior During Therapy: WFL for tasks assessed/performed Overall Cognitive Status: Within Functional Limits for tasks assessed                                     General Comments  Exercises     Shoulder Instructions      Home Living Family/patient expects to be discharged to:: Private residence Living Arrangements: Spouse/significant other;Children Available Help at Discharge: Family;Available PRN/intermittently Type of Home: House Home Access: Stairs to enter [employer] of Steps:  3 Entrance Stairs-Rails: Can reach both Home Layout: Two level;Bed/bath upstairs;Able to live on main level with bedroom/bathroom Alternate Level Stairs-Number of Steps: 20 Alternate Level Stairs-Rails: Left Bathroom Shower/Tub: Tub/shower unit;Curtain   Bathroom Toilet: Standard     Home Equipment: Environmental consultant - 4 wheels;Shower seat;Adaptive equipment Adaptive Equipment: Long-handled sponge Additional Comments: 2 kids ages 32 and 58 and wife goes to school during day      Prior Functioning/Environment Level of Independence: Independent with assistive device(s)        Comments: walks with rollator, takes care of the house and enjoys driving kids to activities         OT Problem List: Decreased activity tolerance;Impaired balance (sitting and/or standing);Decreased knowledge of use of DME or AE;Obesity;Cardiopulmonary status limiting activity      OT Treatment/Interventions: Self-care/ADL training;Energy conservation;DME and/or AE instruction;Patient/family education;Balance training;Therapeutic activities    OT Goals(Current goals can be found in the care plan section) Acute Rehab OT Goals Patient Stated Goal: to get stronger, get back to baseline  OT Goal Formulation: With patient Time For Goal Achievement: 03/09/18 Potential to Achieve Goals: Good ADL Goals Pt Will Perform Grooming: with modified independence;standing(3 activities) Pt Will Perform Lower Body Bathing: with modified independence;with adaptive equipment;sit to/from stand Pt Will Perform Lower Body Dressing: with modified independence;with adaptive equipment;sit to/from stand Pt Will Transfer to Toilet: with modified independence;ambulating;bedside commode(over toilet) Pt Will Perform Toileting - Clothing Manipulation and hygiene: with modified independence;sit to/from stand Pt Will Perform Tub/Shower Transfer: Tub transfer;with modified independence;ambulating;shower seat;rolling walker Additional ADL Goal #1: Pt  will generalize energy conservation strategies in ADL independently.  OT Frequency: Min 2X/week   Barriers to D/C:            Co-evaluation              AM-PAC PT "6 Clicks" Daily Activity     Outcome Measure Help from another person eating meals?: None Help from another person taking care of personal grooming?: A Little Help from another person toileting, which includes using toliet, bedpan, or urinal?: A Lot Help from another person bathing (including washing, rinsing, drying)?: A Little Help from another person to put on and taking off regular upper body clothing?: None Help from another person to put on and taking off regular lower body clothing?: A Little 6 Click Score: 19   End of Session    Activity Tolerance: Patient tolerated treatment well Patient left: in chair;with call bell/phone within reach  OT Visit Diagnosis: Unsteadiness on feet (R26.81);Muscle weakness (generalized) (M62.81);Other (comment)(decreased activity tolerance)                Time: 0737-1062 OT Time Calculation (min): 16 min Charges:  OT General Charges $OT Visit: 1 Visit OT Evaluation $OT Eval Moderate Complexity: 1 Mod  Nestor Lewandowsky, OTR/L Acute Rehabilitation Services Pager: [phone] Office: [phone]  Malka So 02/23/2018, 10:46 AM

## 2018-02-23 NOTE — Clinical Social Work Placement (Signed)
   CLINICAL SOCIAL WORK PLACEMENT  NOTE  Date:  02/23/2018  Patient Details  Name: Jay Chen MRN: [id number] Date of Birth: Apr 14, 1966  Clinical Social Work is seeking post-discharge placement for this patient at the [place] level of care (*CSW will initial, date and re-position this form in  chart as items are completed):  Yes   Patient/family provided with [place] Work Department's list of facilities offering this level of care within the geographic area requested by the patient (or if unable, by the patient's family).  Yes   Patient/family informed of their freedom to choose among providers that offer the needed level of care, that participate in Medicare, Medicaid or managed care program needed by the patient, have an available bed and are willing to accept the patient.  Yes   Patient/family informed of [place]'s ownership interest in [facility] and [facility], as well as of the fact that they are under no obligation to receive care at these facilities.  PASRR submitted to EDS on 02/23/18     PASRR number received on 02/23/18     Existing PASRR number confirmed on       FL2 transmitted to all facilities in geographic area requested by pt/family on 02/23/18     FL2 transmitted to all facilities within larger geographic area on       Patient informed that his/her managed care company has contracts with or will negotiate with certain facilities, including the following:            Patient/family informed of bed offers received.  Patient chooses bed at       Physician recommends and patient chooses bed at      Patient to be transferred to   on  .  Patient to be transferred to facility by       Patient family notified on   of transfer.  Name of family member notified:        PHYSICIAN Please sign FL2     Additional Comment:    _______________________________________________ Candie Chroman, LCSW 02/23/2018,  10:33 AM

## 2018-02-23 NOTE — Progress Notes (Signed)
PMR Admission Coordinator Pre-Admission Assessment  Patient: Jay Chen is an 52 y.o., male MRN: [id number] DOB: 1965/08/26 Height: 5\' 8"  (172.7 cm) Weight: (!) 156.2 kg                                                                                                                                                  Insurance Information HMO: OIB:BCWUGQ: IPA:80/20: OTHER:  [place] of N[place]ol[place]#:[id number] Subscriber:wife CM Name:Janet KLKJZPHXTAV#:[phone]#:[phone] Pre-Cert#:[id number] approved for 7 days 02/23/18-03/01/18 for a brief transition home, if more time needed updates are dueon 03/01/18 [place] Benefits: Phone #:[phone]:01/03/18 Eff. Date:1/1/2019Deduct:$1080Out of Pocket Max:$5468Life QGB:EEFE OFH:$219 co pay per admit then insurance covers 70%SNF:70% 100 days Outpatient:$72 co-pay per visitCo-Pay:visits per medical neccesity Home Health:70%Co-Pay:30% visits per medical neccesity DME:70%Co-Pay:30% Providers:in network  SECONDARY:none  Medicaid Application Date:Case Manager: Disability Application Date:Case Worker: Emergency Amelia    Name [place] Spouse [phone]  [phone]   Antwane, Grose Mother   [phone]     Current Medical History  Patient Admitting Diagnosis: 52 yo male with hx of cervical spondylosis and myelopathy admitted for substantial fluid overload. Pt with subsequent deconditioning in the setting of his premorbid gait deficits.   History of Present Illness: Jay Chen is a 52 year old malewith history of CKD, HTN, morbid obesity, OSA, AAA-4.7 cm, A. fib, CVA-CIR 12/2017,severe cord compression with cervical myelopathy,NICM withchronic systolic CHF;  who was admitted on 02/15/2018 with acute on chronic CHF, weight gain of 27 pounds, left lower extremity edema and worsening dyspnea. He was started on IV diuresis with good results. Dr. [name] consulted for management of CHF and patient underwent cardiac catheterization revealing EF 20 to 25% with normal coronaries. Amiodarone increased due to persistent A. fib and he underwent DCCV on 10/2 and currently in NSR.  Patient has diuresed 40 pounds and serum creatinine trending up therefore diuretics placed on hold.He continues to be limited by hypoxia as well as poor activity tolerance and CIR recommended for follow-up therapy and patient admitted 02/23/18.  Past Medical History      Past Medical History:  Diagnosis Date  . CKD (chronic kidney disease), stage III ([place])   . Congestive heart failure [facility]) May of 2011   Felt to have cor pulmonale; EF 45 to 50% from echo in May 2011  . Cor pulmonale (chronic) ([place])   . Gout    "take daily RX" (02/15/2018)  . History of kidney stones   . Hyperlipidemia   . Hypertension   . Morbid obesity ([place])   . Persistent atrial fibrillation    [name] 12/28/2017  . Sleep apnea    "dx'd; couldn't tolerate CPAP" (02/15/2018)  . Thoracic aneurysm    a. 4.8cm thoracic aortic aneurysm by CT 2013.  [name]  Thoracic aortic aneurysm ([place])    known/notes 12/28/2017  . Type II diabetes mellitus (H[facility])     Family History  family history includes Colon cancer in his father; Hypertension in his mother; Pancreatic cancer in his father; Prostate cancer in his father.  Prior Rehab/Hospitalizations:  Has the patient had major surgery during 100 days prior to admission? Yes  Current Medications   Current Facility-Administered Medications:  .  0.9 %  sodium chloride infusion, 250 mL, Intravenous, PRN, Bensimhon, Shaune Pascal, MD, Last Rate: 10 mL/hr at 02/20/18 1016 .  0.9 %  sodium chloride infusion, 250 mL, Intravenous, PRN, Bensimhon, Shaune Pascal, MD .   acetaminophen (TYLENOL) tablet 650 mg, 650 mg, Oral, Q4H PRN, Bensimhon, Shaune Pascal, MD, 650 mg at 02/22/18 2246 .  allopurinol (ZYLOPRIM) tablet 100 mg, 100 mg, Oral, Daily, Bensimhon, Shaune Pascal, MD, 100 mg at 02/23/18 1004 .  amiodarone (PACERONE) tablet 200 mg, 200 mg, Oral, BID, Clegg, Amy D, NP, 200 mg at 02/23/18 1004 .  aspirin EC tablet 81 mg, 81 mg, Oral, Daily, Bensimhon, Shaune Pascal, MD, 81 mg at 02/23/18 1004 .  atorvastatin (LIPITOR) tablet 80 mg, 80 mg, Oral, q1800, Bensimhon, Shaune Pascal, MD, 80 mg at 02/22/18 1812 .  guaiFENesin-dextromethorphan (ROBITUSSIN DM) 100-10 MG/5ML syrup 5 mL, 5 mL, Oral, Q4H PRN, Bensimhon, Shaune Pascal, MD, 5 mL at 02/16/18 0912 .  hydrALAZINE (APRESOLINE) tablet 75 mg, 75 mg, Oral, Q8H, Bensimhon, Shaune Pascal, MD, 75 mg at 02/23/18 1418 .  insulin aspart (novoLOG) injection 0-15 Units, 0-15 Units, Subcutaneous, TID WC, Bensimhon, Shaune Pascal, MD, 2 Units at 02/23/18 1150 .  insulin aspart (novoLOG) injection 0-5 Units, 0-5 Units, Subcutaneous, QHS, Bensimhon, Shaune Pascal, MD .  isosorbide mononitrate (IMDUR) 24 hr tablet 30 mg, 30 mg, Oral, Daily, Bensimhon, Shaune Pascal, MD, 30 mg at 02/23/18 1004 .  metoprolol succinate (TOPROL-XL) 24 hr tablet 75 mg, 75 mg, Oral, Daily, Bensimhon, Shaune Pascal, MD, 75 mg at 02/23/18 1004 .  omega-3 acid ethyl esters (LOVAZA) capsule 1 g, 1 g, Oral, BID, Bensimhon, Shaune Pascal, MD, 1 g at 02/23/18 1004 .  ondansetron (ZOFRAN) injection 4 mg, 4 mg, Intravenous, Q6H PRN, Bensimhon, Daniel R, MD .  potassium chloride SA (K-DUR,KLOR-CON) CR tablet 40 mEq, 40 mEq, Oral, Daily, Georgiana Shore, NP, 40 mEq at 02/23/18 1004 .  rivaroxaban (XARELTO) tablet 20 mg, 20 mg, Oral, Q supper, Bensimhon, Shaune Pascal, MD, 20 mg at 02/22/18 1812 .  sodium chloride flush (NS) 0.9 % injection 3 mL, 3 mL, Intravenous, Q12H, Bensimhon, Shaune Pascal, MD, 3 mL at 02/23/18 1006 .  sodium chloride flush (NS) 0.9 % injection 3 mL, 3 mL, Intravenous, PRN, Bensimhon, Shaune Pascal, MD .   sodium chloride flush (NS) 0.9 % injection 3 mL, 3 mL, Intravenous, Q12H, Bensimhon, Shaune Pascal, MD, 3 mL at 02/23/18 1006 .  sodium chloride flush (NS) 0.9 % injection 3 mL, 3 mL, Intravenous, PRN, Bensimhon, Shaune Pascal, MD  Patients Current Diet:     Diet Order                  Diet - low sodium heart healthy         Diet heart healthy/carb modified Room service appropriate? Yes; Fluid consistency: Thin; Fluid restriction: 1200 mL Fluid  Diet effective now               Precautions / Restrictions Precautions Precautions: Fall Restrictions Weight Bearing Restrictions: No   Has the  patient had 2 or more falls or a fall with injury in the past year?Unknown  Prior Activity Level Limited Community (1-2x/wk): Prior to this admission patient with limited access to the communty; however prior to stay in August patient was fully independent.   Home Assistive Devices / Equipment Home Assistive Devices/Equipment: Environmental consultant (specify type), CBG Meter, Grab bars in shower, Grab bars around toilet, Shower chair with back, Eyeglasses, Blood pressure cuff, Scales Home Equipment: Walker - 4 wheels, Shower seat, Adaptive equipment  Prior Device Use: Indicate devices/aids used by the patient prior to current illness, exacerbation or injury? Walker  Prior Functional Level Prior Function Level of Independence: Independent with assistive device(s) Comments: walks with rollator, takes care of the house and enjoys driving kids to activities   Self Care: Did the patient need help bathing, dressing, using the toilet or eating? Independent  Indoor Mobility: Did the patient need assistance with walking from room to room (with or without device)? Independent  Stairs: Did the patient need assistance with internal or external stairs (with or without device)? Unknown  Functional Cognition: Did the patient need help planning regular tasks such as shopping or remembering to take medications?  Independent  Current Functional Level Cognition  Overall Cognitive Status: Within Functional Limits for tasks assessed Orientation Level: Oriented X4    Extremity Assessment (includes Sensation/Coordination)  Upper Extremity Assessment: Overall WFL for tasks assessed  Lower Extremity Assessment: Defer to PT evaluation    ADLs  Overall ADL's : Needs assistance/impaired Eating/Feeding: Independent, Sitting Grooming: Standing, Min guard, Wash/dry hands, Brushing hair Upper Body Bathing: Set up, Sitting Upper Body Bathing Details (indicate cue type and reason): with long bath sponge Lower Body Bathing: Minimal assistance, Sit to/from stand Lower Body Bathing Details (indicate cue type and reason): with long handled bath sponge Upper Body Dressing : Set up, Sitting Lower Body Dressing: Minimal assistance, Sit to/from stand Lower Body Dressing Details (indicate cue type and reason): can don socks with increased time, assist for balance with standing Toilet Transfer: Min guard, Ambulation, RW, BSC Toileting- Clothing Manipulation and Hygiene: Maximal assistance, Sit to/from stand Functional mobility during ADLs: Min guard, +2 for safety/equipment, Rolling walker General ADL Comments: Began education in energy conservation strategies.    Mobility  Overal bed mobility: Modified Independent Bed Mobility: Supine to Sit Supine to sit: HOB elevated, Modified independent (Device/Increase time) General bed mobility comments: pt receive in bed    Transfers  Overall transfer level: Needs assistance Equipment used: Rolling walker (2 wheeled)(02--2L) Transfers: Sit to/from Stand Sit to Stand: Min guard Stand pivot transfers: Min assist General transfer comment: min guard from recliner and BSC with use of B UEs to push up    Ambulation / Gait / Stairs / Wheelchair Mobility  Ambulation/Gait Ambulation/Gait assistance: Min guard, +2 safety/equipment Gait Distance (Feet): 40  Feet Assistive device: Rolling walker (2 wheeled) Gait Pattern/deviations: Step-through pattern, Decreased stride length, Wide base of support, Trunk flexed General Gait Details: cues for posture, breathing and chair follow to maximize gait. Pt walked 2 trials of 40' with seated rest between Gait velocity: decreased  Gait velocity interpretation: <1.8 ft/sec, indicate of risk for recurrent falls    Posture / Balance Dynamic Sitting Balance Sitting balance - Comments: no LOB with donning socks Balance Overall balance assessment: Needs assistance Sitting-balance support: Bilateral upper extremity supported Sitting balance-Leahy Scale: Good Sitting balance - Comments: no LOB with donning socks Standing balance-Leahy Scale: Poor Standing balance comment: needs external support when releasing walker in  static standing to manage pants    Special needs/care consideration BiPAP/CPAP: Not prior to admission, now on O2 1.5 L nasal canula  CPM: No Continuous Drip IV: No Dialysis: No         Life Vest: No Oxygen: Yes on 1.5L nasal canula  Special Bed: Yes, air mattress overlay but patient requests regular bed RN notified  Trach Size: No Wound Vac (area): No       Skin: Dry, Blister to left leg with foam dressing                                Bowel mgmt: Continent, last BM 02/21/18 Bladder mgmt: Continent  Diabetic mgmt: Yes     Previous Home Environment Living Arrangements: Spouse/significant other, Children Available Help at Discharge: Family, Available PRN/intermittently Type of Home: House Home Layout: Two level, Bed/bath upstairs, Able to live on main level with bedroom/bathroom Alternate Level Stairs-Rails: Left Alternate Level Stairs-Number of Steps: 20 Home Access: Stairs to enter Entrance Stairs-Rails: Can reach both Entrance Stairs-Number of Steps: 3 Bathroom Shower/Tub: Tub/shower unit, Architectural technologist: Standard Bathroom Accessibility: Yes Home Care Services:  Yes Type of Home Care Services: [place] (if known): [facility] Additional Comments: 2 kids ages 65 and 12 and wife goes to school during day  Discharge Living Setting Plans for Discharge Living Setting: Patient's home, Lives with (comment)(Spouse and children ) Type of Home at Discharge: House Discharge Home Layout: Two level, Able to live on main level with bedroom/bathroom Alternate Level Stairs-Rails: Left Alternate Level Stairs-Number of Steps: 20  Discharge Home Access: Stairs to enter Entrance Stairs-Rails: None Entrance Stairs-Number of Steps: 3 Discharge Bathroom Shower/Tub: Tub/shower unit, Curtain Discharge Bathroom Toilet: Standard Discharge Bathroom Accessibility: Yes How Accessible: Accessible via walker Does the patient have any problems obtaining your medications?: No  Social/Family/Support Systems Patient Roles: Spouse, Parent Contact Information: Spouse, Shelia Anticipated Caregiver: Spouse and mom can assist when she works  Anticipated Ambulance person Information: see above  Ability/Limitations of Caregiver: Spouse works days  Careers adviser: 24/7 Discharge Plan Discussed with Primary Caregiver: Yes(discussed with patient ) Is Caregiver In Agreement with Plan?: Yes(patient in agreement ) Does Caregiver/Family have Issues with Lodging/Transportation while Pt is in Rehab?: No  Goals/Additional Needs Patient/Family Goal for Rehab: PT/OT: Mod I-Sueprvision  Expected length of stay: ~7 days  Dietary Needs: Heart Healthy, Carb. Mod., 1247mL fluid restrictions Equipment Needs: TBD Pt/Family Agrees to Admission and willing to participate: Yes Program Orientation Provided & Reviewed with Pt/Caregiver Including Roles  & Responsibilities: Yes Additional Information Needs: Cardiology to follow while on CIR discussed with NP per Dr. Letta Pate request Information Needs to be Provided By: Team FYI  Barriers to Discharge: Decreased  caregiver support, Medical stability  Decrease burden of Care through IP rehab admission: No  Possible need for SNF placement upon discharge: No  Patient Condition: This patient's condition remains as documented in the consult dated 02/21/18 at 1633, in which the Rehabilitation Physician determined and documented that the patient's condition is appropriate for intensive rehabilitative care in an inpatient rehabilitation facility. Will admit to inpatient rehab today.  Preadmission Screen Completed By:  Gunnar Fusi, 02/23/2018 4:11 PM ______________________________________________________________________   Discussed status with Dr. Letta Pate on 02/23/18 at 1620 and received telephone approval for admission today.  Admission Coordinator:  Gunnar Fusi, time 1620/Date 02/23/18           Cosigned by: Letta Pate,  Luanna Salk, MD at 02/23/2018 4:25 PM  Revision History

## 2018-02-23 NOTE — Progress Notes (Signed)
Inpatient Rehabilitation  Met with patient at bedside to discuss team's recommendation for IP Rehab.  Shared booklets, insurance verification letter, and answered questions.  Insurance has been initiated.  Will plan to update team as soon as I have a decision.  Call if questions.    Melissa Bowie, M.A., CCC/SLP Admission Coordinator  [facility]  Cell [phone]  

## 2018-02-23 NOTE — PMR Pre-admission (Signed)
PMR Admission Coordinator Pre-Admission Assessment  Patient: Jay Chen is an 52 y.o., male MRN: [id number] DOB: 1965/11/19 Height: 5\' 8"  (172.7 cm) Weight: (!) 156.2 kg              Insurance Information HMO:     PPO: yes     PCP:      IPA:      80/20:      OTHER:  PRIMARY: [facility] [place]      Policy#: [id number]      Subscriber: wife CM Name: Levy Pupa      Phone#: [phone]     Fax#: [phone] Pre-Cert#: [id number] approved for 7 days 02/23/18-03/01/18 for a brief transition home, if more time needed updates are due on 03/01/18      Employer: [employer] Benefits:  Phone #: [phone]     Name: 01/03/18 Eff. Date: 05/24/2017     Deduct: $1080      Out of Pocket Max: $5468     Life Max: none CIR: $337 co pay per admit then insurance covers 70%      SNF: 70%  100 days Outpatient: $72 co-pay per visit     Co-Pay: visits per medical neccesity Home Health: 70%      Co-Pay: 30% visits per medical neccesity DME: 70%     Co-Pay: 30% Providers: in network  SECONDARY: none       Medicaid Application Date:       Case Manager:  Disability Application Date:       Case Worker:  Emergency Contact Information Contact Information    Name Relation Home Work [place] Spouse [phone]  [phone]   Ajdin, Macke Mother   [phone]     Current Medical History  Patient Admitting Diagnosis: 52 yo male with hx of cervical spondylosis and myelopathy admitted for substantial fluid overload. Pt with subsequent deconditioning in the setting of his premorbid gait deficits.   History of Present Illness: Jay Chen is a 52 year old male with history of CKD, HTN, morbid obesity, OSA, AAA-4.7 cm, A. 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  He was started  on IV diuresis with good results.  Dr. [name] consulted for management of CHF and patient underwent cardiac catheterization revealing EF 20 to 25% with normal coronaries.  Amiodarone increased due to persistent A. fib and he underwent DCCV on 10/2 and currently in NSR.  Patient has diuresed 40 pounds and serum creatinine trending up therefore diuretics placed on hold.  He continues to be limited by hypoxia as well as poor activity tolerance and CIR recommended for follow-up therapy and patient admitted 02/23/18.      Past Medical History  Past Medical History:  Diagnosis Date  . CKD (chronic kidney disease), stage III ([place])   . Congestive heart failure [facility]) May of 2011   Felt to have cor pulmonale; EF 45 to 50% from echo in May 2011  . Cor pulmonale (chronic) ([place])   . Gout    "take daily RX" (02/15/2018)  . History of kidney stones   . Hyperlipidemia   . Hypertension   . Morbid obesity ([place])   . Persistent atrial fibrillation    [name] 12/28/2017  . Sleep apnea    "dx'd; couldn't tolerate CPAP" (02/15/2018)  .  Thoracic aneurysm    a. 4.8cm thoracic aortic aneurysm by CT 2013.  [name] Thoracic aortic aneurysm ([place])    known/notes 12/28/2017  . Type II diabetes mellitus (HCC)     Family History  family history includes Colon cancer in his father; Hypertension in his mother; Pancreatic cancer in his father; Prostate cancer in his father.  Prior Rehab/Hospitalizations:  Has the patient had major surgery during 100 days prior to admission? Yes  Current Medications   Current Facility-Administered Medications:  .  0.9 %  sodium chloride infusion, 250 mL, Intravenous, PRN, Bensimhon, Shaune Pascal, MD, Last Rate: 10 mL/hr at 02/20/18 1016 .  0.9 %  sodium chloride infusion, 250 mL, Intravenous, PRN, Bensimhon, Shaune Pascal, MD .  acetaminophen (TYLENOL) tablet 650 mg, 650 mg, Oral, Q4H PRN, Bensimhon, Shaune Pascal, MD, 650 mg at 02/22/18 2246 .  allopurinol (ZYLOPRIM) tablet 100 mg, 100 mg, Oral,  Daily, Bensimhon, Shaune Pascal, MD, 100 mg at 02/23/18 1004 .  amiodarone (PACERONE) tablet 200 mg, 200 mg, Oral, BID, Clegg, Amy D, NP, 200 mg at 02/23/18 1004 .  aspirin EC tablet 81 mg, 81 mg, Oral, Daily, Bensimhon, Shaune Pascal, MD, 81 mg at 02/23/18 1004 .  atorvastatin (LIPITOR) tablet 80 mg, 80 mg, Oral, q1800, Bensimhon, Shaune Pascal, MD, 80 mg at 02/22/18 1812 .  guaiFENesin-dextromethorphan (ROBITUSSIN DM) 100-10 MG/5ML syrup 5 mL, 5 mL, Oral, Q4H PRN, Bensimhon, Shaune Pascal, MD, 5 mL at 02/16/18 0912 .  hydrALAZINE (APRESOLINE) tablet 75 mg, 75 mg, Oral, Q8H, Bensimhon, Shaune Pascal, MD, 75 mg at 02/23/18 1418 .  insulin aspart (novoLOG) injection 0-15 Units, 0-15 Units, Subcutaneous, TID WC, Bensimhon, Shaune Pascal, MD, 2 Units at 02/23/18 1150 .  insulin aspart (novoLOG) injection 0-5 Units, 0-5 Units, Subcutaneous, QHS, Bensimhon, Shaune Pascal, MD .  isosorbide mononitrate (IMDUR) 24 hr tablet 30 mg, 30 mg, Oral, Daily, Bensimhon, Shaune Pascal, MD, 30 mg at 02/23/18 1004 .  metoprolol succinate (TOPROL-XL) 24 hr tablet 75 mg, 75 mg, Oral, Daily, Bensimhon, Shaune Pascal, MD, 75 mg at 02/23/18 1004 .  omega-3 acid ethyl esters (LOVAZA) capsule 1 g, 1 g, Oral, BID, Bensimhon, Shaune Pascal, MD, 1 g at 02/23/18 1004 .  ondansetron (ZOFRAN) injection 4 mg, 4 mg, Intravenous, Q6H PRN, Bensimhon, Daniel R, MD .  potassium chloride SA (K-DUR,KLOR-CON) CR tablet 40 mEq, 40 mEq, Oral, Daily, Georgiana Shore, NP, 40 mEq at 02/23/18 1004 .  rivaroxaban (XARELTO) tablet 20 mg, 20 mg, Oral, Q supper, Bensimhon, Shaune Pascal, MD, 20 mg at 02/22/18 1812 .  sodium chloride flush (NS) 0.9 % injection 3 mL, 3 mL, Intravenous, Q12H, Bensimhon, Shaune Pascal, MD, 3 mL at 02/23/18 1006 .  sodium chloride flush (NS) 0.9 % injection 3 mL, 3 mL, Intravenous, PRN, Bensimhon, Shaune Pascal, MD .  sodium chloride flush (NS) 0.9 % injection 3 mL, 3 mL, Intravenous, Q12H, Bensimhon, Shaune Pascal, MD, 3 mL at 02/23/18 1006 .  sodium chloride flush (NS) 0.9 % injection 3  mL, 3 mL, Intravenous, PRN, Bensimhon, Shaune Pascal, MD  Patients Current Diet:  Diet Order            Diet - low sodium heart healthy        Diet heart healthy/carb modified Room service appropriate? Yes; Fluid consistency: Thin; Fluid restriction: 1200 mL Fluid  Diet effective now              Precautions / Restrictions Precautions Precautions: Fall Restrictions Weight Bearing Restrictions: No  Has the patient had 2 or more falls or a fall with injury in the past year?Unknown  Prior Activity Level Limited Community (1-2x/wk): Prior to this admission patient with limited access to the communty; however prior to stay in August patient was fully independent.   Home Assistive Devices / Equipment Home Assistive Devices/Equipment: Environmental consultant (specify type), CBG Meter, Grab bars in shower, Grab bars around toilet, Shower chair with back, Eyeglasses, Blood pressure cuff, Scales Home Equipment: Walker - 4 wheels, Shower seat, Adaptive equipment  Prior Device Use: Indicate devices/aids used by the patient prior to current illness, exacerbation or injury? Walker  Prior Functional Level Prior Function Level of Independence: Independent with assistive device(s) Comments: walks with rollator, takes care of the house and enjoys driving kids to activities   Self Care: Did the patient need help bathing, dressing, using the toilet or eating? Independent  Indoor Mobility: Did the patient need assistance with walking from room to room (with or without device)? Independent  Stairs: Did the patient need assistance with internal or external stairs (with or without device)? Unknown  Functional Cognition: Did the patient need help planning regular tasks such as shopping or remembering to take medications? Independent  Current Functional Level Cognition  Overall Cognitive Status: Within Functional Limits for tasks assessed Orientation Level: Oriented X4    Extremity Assessment (includes  Sensation/Coordination)  Upper Extremity Assessment: Overall WFL for tasks assessed  Lower Extremity Assessment: Defer to PT evaluation    ADLs  Overall ADL's : Needs assistance/impaired Eating/Feeding: Independent, Sitting Grooming: Standing, Min guard, Wash/dry hands, Brushing hair Upper Body Bathing: Set up, Sitting Upper Body Bathing Details (indicate cue type and reason): with long bath sponge Lower Body Bathing: Minimal assistance, Sit to/from stand Lower Body Bathing Details (indicate cue type and reason): with long handled bath sponge Upper Body Dressing : Set up, Sitting Lower Body Dressing: Minimal assistance, Sit to/from stand Lower Body Dressing Details (indicate cue type and reason): can don socks with increased time, assist for balance with standing Toilet Transfer: Min guard, Ambulation, RW, BSC Toileting- Clothing Manipulation and Hygiene: Maximal assistance, Sit to/from stand Functional mobility during ADLs: Min guard, +2 for safety/equipment, Rolling walker General ADL Comments: Began education in energy conservation strategies.    Mobility  Overal bed mobility: Modified Independent Bed Mobility: Supine to Sit Supine to sit: HOB elevated, Modified independent (Device/Increase time) General bed mobility comments: pt receive in bed    Transfers  Overall transfer level: Needs assistance Equipment used: Rolling walker (2 wheeled)(02--2L) Transfers: Sit to/from Stand Sit to Stand: Min guard Stand pivot transfers: Min assist General transfer comment: min guard from recliner and BSC with use of B UEs to push up    Ambulation / Gait / Stairs / Wheelchair Mobility  Ambulation/Gait Ambulation/Gait assistance: Min guard, +2 safety/equipment Gait Distance (Feet): 40 Feet Assistive device: Rolling walker (2 wheeled) Gait Pattern/deviations: Step-through pattern, Decreased stride length, Wide base of support, Trunk flexed General Gait Details: cues for posture, breathing  and chair follow to maximize gait. Pt walked 2 trials of 40' with seated rest between Gait velocity: decreased  Gait velocity interpretation: <1.8 ft/sec, indicate of risk for recurrent falls    Posture / Balance Dynamic Sitting Balance Sitting balance - Comments: no LOB with donning socks Balance Overall balance assessment: Needs assistance Sitting-balance support: Bilateral upper extremity supported Sitting balance-Leahy Scale: Good Sitting balance - Comments: no LOB with donning socks Standing balance-Leahy Scale: Poor Standing balance comment: needs external support when releasing  walker in static standing to manage pants    Special needs/care consideration BiPAP/CPAP: Not prior to admission, now on O2 1.5 L nasal canula  CPM: No Continuous Drip IV: No Dialysis: No         Life Vest: No Oxygen: Yes on 1.5L nasal canula  Special Bed: Yes, air mattress overlay but patient requests regular bed RN notified  Trach Size: No Wound Vac (area): No       Skin: Dry, Blister to left leg with foam dressing                                Bowel mgmt: Continent, last BM 02/21/18 Bladder mgmt: Continent  Diabetic mgmt: Yes     Previous Home Environment Living Arrangements: Spouse/significant other, Children Available Help at Discharge: Family, Available PRN/intermittently Type of Home: House Home Layout: Two level, Bed/bath upstairs, Able to live on main level with bedroom/bathroom Alternate Level Stairs-Rails: Left Alternate Level Stairs-Number of Steps: 20 Home Access: Stairs to enter Entrance Stairs-Rails: Can reach both Entrance Stairs-Number of Steps: 3 Bathroom Shower/Tub: Tub/shower unit, Architectural technologist: Standard Bathroom Accessibility: Yes Home Care Services: Yes Type of Home Care Services: [place] (if known): [facility] Additional Comments: 2 kids ages 4 and 47 and wife goes to school during day  Discharge Living Setting Plans for  Discharge Living Setting: Patient's home, Lives with (comment)(Spouse and children ) Type of Home at Discharge: House Discharge Home Layout: Two level, Able to live on main level with bedroom/bathroom Alternate Level Stairs-Rails: Left Alternate Level Stairs-Number of Steps: 20  Discharge Home Access: Stairs to enter Entrance Stairs-Rails: None Entrance Stairs-Number of Steps: 3 Discharge Bathroom Shower/Tub: Tub/shower unit, Curtain Discharge Bathroom Toilet: Standard Discharge Bathroom Accessibility: Yes How Accessible: Accessible via walker Does the patient have any problems obtaining your medications?: No  Social/Family/Support Systems Patient Roles: Spouse, Parent Contact Information: Spouse, Shelia Anticipated Caregiver: Spouse and mom can assist when she works  Anticipated Ambulance person Information: see above  Ability/Limitations of Caregiver: Spouse works days  Careers adviser: 24/7 Discharge Plan Discussed with Primary Caregiver: Yes(discussed with patient ) Is Caregiver In Agreement with Plan?: Yes(patient in agreement ) Does Caregiver/Family have Issues with Lodging/Transportation while Pt is in Rehab?: No  Goals/Additional Needs Patient/Family Goal for Rehab: PT/OT: Mod I-Sueprvision  Expected length of stay: ~7 days  Dietary Needs: Heart Healthy, Carb. Mod., 1244mL fluid restrictions Equipment Needs: TBD Pt/Family Agrees to Admission and willing to participate: Yes Program Orientation Provided & Reviewed with Pt/Caregiver Including Roles  & Responsibilities: Yes Additional Information Needs: Cardiology to follow while on CIR discussed with NP per Dr. Letta Pate request Information Needs to be Provided By: Team FYI  Barriers to Discharge: Decreased caregiver support, Medical stability  Decrease burden of Care through IP rehab admission: No  Possible need for SNF placement upon discharge: No  Patient Condition: This patient's condition remains as  documented in the consult dated 02/21/18 at 1633, in which the Rehabilitation Physician determined and documented that the patient's condition is appropriate for intensive rehabilitative care in an inpatient rehabilitation facility. Will admit to inpatient rehab today.  Preadmission Screen Completed By:  Gunnar Fusi, 02/23/2018 4:11 PM ______________________________________________________________________   Discussed status with Dr. Letta Pate on10/3/19 at 1620 and received telephone approval for admission today.  Admission Coordinator:  Gunnar Fusi, time 1620/Date 02/23/18

## 2018-02-23 NOTE — Clinical Social Work Note (Signed)
Clinical Social Work Assessment  Patient Details  Name: Jay Chen MRN: [id number] Date of Birth: 01-06-1966  Date of referral:  02/23/18               Reason for consult:  Facility Placement, Discharge Planning                Permission sought to share information with:  Chartered certified accountant granted to share information::  Yes, Verbal Permission Granted  Name::        Agency::  SNF's  Relationship::     Contact Information:     Housing/Transportation Living arrangements for the past 2 months:  Single Family Home Source of Information:  Patient, Medical Team Patient Interpreter Needed:  None Criminal Activity/Legal Involvement Pertinent to Current Situation/Hospitalization:  No - Comment as needed Significant Relationships:  Adult Children, Spouse, Parents Lives with:  Adult Children, Spouse Do you feel safe going back to the place where you live?  Yes Need for family participation in patient care:  Yes (Comment)  Care giving concerns:  PT recommending CIR. CSW initiating SNF backup.   Social Worker assessment / plan:  CSW met with patient. No supports at bedside. CSW introduced role and explained that PT recommendations. Discussed potential barriers to CIR. Patient agreeable to SNF if unable to admit to CIR. No further concerns. CSW encouraged patient to contact CSW as needed. CSW will continue to follow patient for support and facilitate discharge to SNF, if needed, once medically stable.  Employment status:  Unemployed Forensic scientist:    PT Recommendations:  Inpatient Rehab Consult Information / Referral to community resources:  [place]  Patient/Family's Response to care:  Patient agreeable to SNF if he cannot admit to CIR. Patient's family supportive and involved in patient's care. Patient appreciated social work intervention.  Patient/Family's Understanding of and Emotional Response to Diagnosis, Current Treatment, and  Prognosis:  Patient has a good understanding of the reason for admission and his need for rehab prior to returning home. Patient appears pleased with hospital care.  Emotional Assessment Appearance:  Appears stated age Attitude/Demeanor/Rapport:  Engaged Affect (typically observed):  Accepting, Appropriate, Calm Orientation:  Oriented to Self, Oriented to Place, Oriented to  Time, Oriented to Situation Alcohol / Substance use:  Never Used Psych involvement (Current and /or in the community):  No (Comment)  Discharge Needs  Concerns to be addressed:  Care Coordination Readmission within the last 30 days:  No Current discharge risk:  Dependent with Mobility Barriers to Discharge:  Continued Medical Work up, [place], LCSW 02/23/2018, 10:31 AM

## 2018-02-23 NOTE — Progress Notes (Signed)
Notified [facility] that pt will dc to CIR today. Jonnie Finner RN CCM Case Mgmt phone [phone]

## 2018-02-23 NOTE — Progress Notes (Addendum)
Advanced Heart Failure Rounding Note  PCP: [name], MD Primary Cardiologist: Dr [name]  Subjective:   Echo 12/2017 EF 20-25%  Yesterday diuretics held with elevated creatinine.Had successful DC-CV. Maintaining NSR.   Denies SOB, orthopnea or PND. Creatinine up slightly 1.82 -> 1.97 -> 2.12   RHC/LHC  Ao = 125/87 (104) LV = 122/23  RA = 17  RV = 56/16 PA = 52/35 (42) PCW = 21 Fick cardiac output/index = 7.0.2.7 SVR = 993 PVR = 3.0 WU Ao sat = 92% PA sat = 66%, 65%  Assessment: 1. Normal coronaries 2. 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 Heart appears rotated 90% leftward on cath images 4. Moderate mixed pulmonary venous/arterial HTN  Objective:   Weight Range: (!) 156.2 kg Body mass index is 52.37 kg/m.   Vital Signs:   Temp:  [98 F (36.7 C)-99.7 F (37.6 C)] 98.4 F (36.9 C) (10/03 0438) Pulse Rate:  [64-96] 80 (10/03 0438) Resp:  [15-27] 18 (10/03 0438) BP: (111-125)/(53-89) 125/89 (10/03 0438) SpO2:  [87 %-98 %] 95 % (10/03 0438) Weight:  [156.2 kg] 156.2 kg (10/03 0438) Last BM Date: 02/21/18  Weight change: Filed Weights   02/21/18 0420 02/22/18 0042 02/23/18 0438  Weight: (!) 154.6 kg (!) 156.2 kg (!) 156.2 kg    Intake/Output:   Intake/Output Summary (Last 24 hours) at 02/23/2018 0718 Last data filed at 02/23/2018 0450 Gross per 24 hour  Intake 543 ml  Output 750 ml  Net -207 ml      Physical Exam    General:  No resp difficulty. In bed  HEENT: normal Neck: supple. JVP 8-9 . Carotids 2+ bilat; no bruits. No lymphadenopathy or thryomegaly appreciated. Cor: PMI nondisplaced. Regular rate & rhythm. No rubs, gallops or murmurs. Lungs: clear on 2 liters oxygen.  Abdomen: obese. soft, nontender, nondistended. No hepatosplenomegaly. No bruits or masses. Good bowel sounds. Extremities: no cyanosis, clubbing, rash, trace edema Neuro: alert & orientedx3, cranial nerves grossly intact. moves all 4 extremities w/o difficulty. Affect  pleasant  Telemetry  NSR 60s with occasional PVCs personally reviewed.   EKG    No new tracings.   Labs    CBC Recent Labs    02/21/18 0424  WBC 7.5  HGB 13.1  HCT 41.9  MCV 100.0  PLT 160   Basic Metabolic Panel Recent Labs    02/22/18 0421 02/23/18 0530  NA 138 141  K 3.6 3.7  CL 93* 97*  CO2 32 37*  GLUCOSE 145* 128*  BUN 43* 45*  CREATININE 1.97* 2.12*  CALCIUM 8.7* 8.7*   Liver Function Tests No results for input(s): AST, ALT, ALKPHOS, BILITOT, PROT, ALBUMIN in the last 72 hours. No results for input(s): LIPASE, AMYLASE in the last 72 hours. Cardiac Enzymes No results for input(s): CKTOTAL, CKMB, CKMBINDEX, TROPONINI in the last 72 hours.  BNP: BNP (last 3 results) Recent Labs    12/27/17 2354 02/15/18 1559  BNP 283.3* 847.1*    ProBNP (last 3 results) No results for input(s): PROBNP in the last 8760 hours.   D-Dimer No results for input(s): DDIMER in the last 72 hours. Hemoglobin A1C No results for input(s): HGBA1C in the last 72 hours. Fasting Lipid Panel No results for input(s): CHOL, HDL, LDLCALC, TRIG, CHOLHDL, LDLDIRECT in the last 72 hours. Thyroid Function Tests No results for input(s): TSH, T4TOTAL, T3FREE, THYROIDAB in the last 72 hours.  Invalid input(s): FREET3  Other results:   Imaging  No results found.   Medications:     Scheduled Medications: . allopurinol  100 mg Oral Daily  . amiodarone  200 mg Oral BID  . aspirin EC  81 mg Oral Daily  . atorvastatin  80 mg Oral q1800  . hydrALAZINE  75 mg Oral Q8H  . insulin aspart  0-15 Units Subcutaneous TID WC  . insulin aspart  0-5 Units Subcutaneous QHS  . isosorbide mononitrate  30 mg Oral Daily  . metoprolol succinate  75 mg Oral Daily  . omega-3 acid ethyl esters  1 g Oral BID  . potassium chloride  40 mEq Oral Daily  . rivaroxaban  20 mg Oral Q supper  . sodium chloride flush  3 mL Intravenous Q12H  . sodium chloride flush  3 mL Intravenous Q12H     Infusions: . sodium chloride 10 mL/hr at 02/20/18 1016  . sodium chloride      PRN Medications: sodium chloride, sodium chloride, acetaminophen, guaiFENesin-dextromethorphan, ondansetron (ZOFRAN) IV, sodium chloride flush, sodium chloride flush    Patient Profile   Jay Chen is a 52 y.o. male with acute on chronic combined CHF, presumed non-ischemic (no cath), EF 20-25%, AAA 4.7 cm by echo, hx of CVA, HTN, HLD, OSA, DM2, CKD III (Baseline Cr 1.7 - 1.9), and morbid obesity (Body mass index is 56.56 kg/m.)   [facility] with acute on chronic combined CHF and marked volume overload.    Assessment/Plan   1. Acute on chronic combined CHF, presumed non-ischemic (no cath) - Echo 12/2017 EF 20-25% - R/LHC normal cors. RA 17 PCWP 21 - Overall he has diuresed 40 pounds. Continue to hold diuretics. Creatinine trending up 1.9> 2.1  - Anticipate starting 60 mg po lasix daily tomorrow if renal function improving. Prior to admit he was take 40 mg po lasix daily.  -- Continue Toprol 75  daily - Continue hydralazine 75 mg tid.  - Continue imdur 30 mg daily - No ARB/ARNi/spiro currently with CKD 3. Hold off for now.  -Creatinine trending up.   2. AKI on CKD III - Baseline Cr 1.7 - 1.9 Creatinine trending up 1.7>1.8>2.1  -BMEt in am.    3. Persistent Afib/Aflutter - New diagnosis 08/2017, which was being treated with rate control.   -S/P DC-CV on 10/2. Maintaining NSR.  - Continue amio to 200 mg twice a day.  - Continue  Toprol 75 mg daily.  - Continue Xarelto 20 mg daily   4. H/o CVA - Only remaining deficit.  - On Xarelto chronically.    5. HTN . Stable.  - Cr borderline for ARB/ARNi/Spiro.    6. AAA  - 4.7 cm by echo 12/2017  - Will need further imaging as creatinine permits.    7. OSA - Non-compliant with CPAP. Discussed importance of CPAP.   8. DM2 - Stable CBGs. Continue SSI - Hgb A1C 7.1 01/24/18   9. Morbid obesity -Body mass index is  52.37 kg/m. - Needs significant weight loss.  - Nutrition consult ordered   CIR once approved. HF team will follow while on CIR.    Length of Stay: [place], NP  02/23/2018, 7:18 AM  Advanced Heart Failure Team Pager [phone] (M-F; [place])  Please contact [facility] for night-coverage after hours (4p -7a ) and weekends on [email or web address]  Patient seen and examined with [name], NP. We discussed all aspects of the encounter. I agree with the assessment and plan  as stated above.   Maintaining NSR on po amio. Volume status ok. Creatinine up slightly but not dramatically. May have component of contrast-induced nephropathy. Would hold diuretics today and start torsemide 40mg  daily tomorrow. [place] for [employer] today.   Glori Bickers, MD  12:41 PM

## 2018-02-23 NOTE — H&P (Addendum)
Physical Medicine and Rehabilitation Admission H&P        Chief Complaint  Patient presents with  . weakness  : HPI: Jay Chen is a 52 year old male with history of CKD, HTN, morbid obesity, OSA, AAA-4.7 cm, A. 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  He was started on IV diuresis with good results.  Dr. [name] consulted for management of CHF and patient underwent cardiac catheterization revealing EF 20 to 25% with normal coronaries.  Amiodarone increased due to persistent A. fib and he underwent DCCV on 10/2 and currently in NSR.   Patient has diuresed 40 pounds and serum creatinine trending up therefore diuretics placed on hold.  He continues to be limited by hypoxia as well as poor activity tolerance and CIR recommended for follow-up therapy      Review of Systems  Constitutional: Negative for chills and fever.  HENT: Negative for hearing loss.   Eyes: Negative for blurred vision and double vision.  Respiratory: Negative for cough.        Intermittent shortness of breath with exertion  Cardiovascular: Positive for leg swelling. Negative for chest pain and palpitations.  Gastrointestinal: Positive for constipation. Negative for nausea and vomiting.  Genitourinary: Negative for dysuria and hematuria.  Musculoskeletal: Positive for joint pain and myalgias.  Skin: Negative for rash.  All other systems reviewed and are negative.       Past Medical History:  Diagnosis Date  . CKD (chronic kidney disease), stage III ([place])    . Congestive heart failure [facility]) May of 2011    Felt to have cor pulmonale; EF 45 to 50% from echo in May 2011  . Cor pulmonale (chronic) ([place])    . Gout      "take daily RX" (12/28/2017)  . Hyperlipidemia    . Hypertension    . Morbid  obesity ([place])    . Persistent atrial fibrillation ([place])      [name] 12/28/2017  . Sleep apnea      "dx'd; couldn't tolerate CPAP" (12/28/2017)  . Thoracic aneurysm      a. 4.8cm thoracic aortic aneurysm by CT 2013.  [name] Thoracic aortic aneurysm ([place])      known/notes 12/28/2017  . Type II diabetes mellitus ([place])           Past Surgical History:  Procedure Laterality Date  . LAPAROSCOPIC CHOLECYSTECTOMY   2000  . US ECHOCARDIOGRAPHY   09/21/2009    EF 45-50%; Cavity size was severely dilated, severe concentric hypertrophy and normal wall motion         Family History  Problem Relation Age of Onset  . Hypertension Mother    . Pancreatic cancer Father    . Prostate cancer Father    . Colon cancer Father      Social History:  reports that he has never smoked. He has never used smokeless tobacco. He reports that he drank alcohol. He reports that he does not use drugs. Allergies: No Known Allergies       Medications Prior to Admission  Medication Sig Dispense Refill  . albuterol (PROVENTIL HFA;VENTOLIN HFA) 108 (90 BASE) MCG/ACT inhaler Inhale 2 puffs into the lungs every 6 (six) hours as needed for wheezing  or shortness of breath.      . allopurinol (ZYLOPRIM) 100 MG tablet Take 1 tablet (100 mg total) by mouth daily. 90 tablet 0  . atorvastatin (LIPITOR) 40 MG tablet Take 1 tablet (40 mg total) by mouth daily. 90 tablet 3  . carvedilol (COREG) 6.25 MG tablet Take 1 tablet (6.25 mg total) by mouth 2 (two) times daily. 180 tablet 3  . cloNIDine (CATAPRES) 0.1 MG tablet Take 1 tab in the am and 2 tab every pm. (Patient taking differently: Take 0.1-0.2 mg by mouth See admin instructions. Take 1 tablet every morning and take 2 tablets every evening) 270 tablet 0  . dapagliflozin propanediol (FARXIGA) 5 MG TABS tablet Take 5 mg by mouth daily. 90 tablet 1  . furosemide (LASIX) 40 MG tablet Take 1 tablet (40 mg total) by mouth daily. 90 tablet 1  . magnesium oxide (MAG-OX) 400 MG tablet Take 1  tablet (400 mg total) by mouth daily. 90 tablet 1  . metFORMIN (GLUCOPHAGE) 1000 MG tablet Take 1 tablet (1,000 mg total) by mouth 2 (two) times daily with a meal. 180 tablet 1  . omega-3 acid ethyl esters (LOVAZA) 1 G capsule Take 2 capsules (2 g total) by mouth 2 (two) times daily. 120 capsule 11  . potassium chloride SA (KLOR-CON M20) 20 MEQ tablet Take 2 tablets (40 mEq total) by mouth daily. 180 tablet 1  . rivaroxaban (XARELTO) 20 MG TABS tablet Take 1 tablet (20 mg total) by mouth daily with supper. 90 tablet 1  . sacubitril-valsartan (ENTRESTO) 24-26 MG Take 1 tablet by mouth 2 (two) times daily. 60 tablet 5  . Exenatide ER (BYDUREON BCISE) 2 MG/0.85ML AUIJ Inject 1 Act into the skin once a week. (Patient not taking: Reported on 12/28/2017) 12 pen 1  . glucose blood (BAYER CONTOUR NEXT TEST) test strip Use BID 100 each 12  . Lancets Misc. (UNISTIK 1) MISC Test up to twice daily 50 each 11      Drug Regimen Review Drug regimen was reviewed and remains appropriate with no significant issues identified   Home: Home Living Family/patient expects to be discharged to:: Private residence Living Arrangements: Spouse/significant other, Children Available Help at Discharge: Family, Available PRN/intermittently Type of Home: House Home Access: Stairs to enter Technical brewer of Steps: 3 Entrance Stairs-Rails: None Home Layout: Two level, Bed/bath upstairs, Able to live on main level with bedroom/bathroom Alternate Level Stairs-Number of Steps: 20 Alternate Level Stairs-Rails: Left Bathroom Shower/Tub: Tub/shower unit, Air cabin crew Accessibility: Yes   Functional History: Prior Function Level of Independence: Independent Comments: drives   Functional Status:  Mobility: Bed Mobility Overal bed mobility: Needs Assistance Bed Mobility: Rolling Rolling: Max assist, +2 for physical assistance General bed mobility comments: maxAx2 for rolling to  provide pericare and change linens   ADL:   Cognition: Cognition Overall Cognitive Status: Within Functional Limits for tasks assessed Orientation Level: Oriented X4 Cognition Arousal/Alertness: Awake/alert Behavior During Therapy: WFL for tasks assessed/performed Overall Cognitive Status: Within Functional Limits for tasks assessed General Comments: unaware that he was soaked in urine   Physical Exam: Blood pressure (!) 121/98, pulse 97, temperature 98.3 F (36.8 C), temperature source Oral, resp. rate 14, height 5' 8"  (1.727 m), weight (!) 155.1 kg, SpO2 93 %. Physical Exam  Vitals reviewed. Constitutional: He is oriented to person, place, and time.  52 year old right-handed obese male  HENT:  Head: Normocephalic.  Eyes: EOM are normal. Right eye exhibits no discharge. Left  eye exhibits no discharge.  Neck: Normal range of motion. Neck supple. No thyromegaly present.  Cardiovascular: Regular rhythm.  Cardiac rate controlled  Respiratory: Effort normal and breath sounds normal. No respiratory distress.  GI: Soft. Bowel sounds are normal. He exhibits no distension.  Neurological: He is alert and oriented to person, place, and time.  Follows full commands.  Fair awareness of deficits  Skin: Skin is warm and dry.   Motor 4/5 in BUE and BLE Sensation intact   Lab Results Last 48 Hours        Results for orders placed or performed during the hospital encounter of 12/27/17 (from the past 48 hour(s))  Glucose, capillary     Status: Abnormal    Collection Time: 12/31/17  4:37 PM  Result Value Ref Range    Glucose-Capillary 128 (H) 70 - 99 mg/dL  Glucose, capillary     Status: Abnormal    Collection Time: 12/31/17  9:33 PM  Result Value Ref Range    Glucose-Capillary 140 (H) 70 - 99 mg/dL  CBC with Differential/Platelet     Status: Abnormal    Collection Time: 01/01/18  1:59 AM  Result Value Ref Range    WBC 8.1 4.0 - 10.5 K/uL    RBC 3.99 (L) 4.22 - 5.81 MIL/uL     Hemoglobin 12.4 (L) 13.0 - 17.0 g/dL    HCT 38.4 (L) 39.0 - 52.0 %    MCV 96.2 78.0 - 100.0 fL    MCH 31.1 26.0 - 34.0 pg    MCHC 32.3 30.0 - 36.0 g/dL    RDW 13.6 11.5 - 15.5 %    Platelets 234 150 - 400 K/uL    Neutrophils Relative % 71 %    Neutro Abs 5.7 1.7 - 7.7 K/uL    Lymphocytes Relative 13 %    Lymphs Abs 1.1 0.7 - 4.0 K/uL    Monocytes Relative 13 %    Monocytes Absolute 1.1 (H) 0.1 - 1.0 K/uL    Eosinophils Relative 3 %    Eosinophils Absolute 0.2 0.0 - 0.7 K/uL    Basophils Relative 0 %    Basophils Absolute 0.0 0.0 - 0.1 K/uL    Immature Granulocytes 0 %    Abs Immature Granulocytes 0.0 0.0 - 0.1 K/uL      Comment: Performed at [facility], [address] [place], [place] [address]  Basic metabolic panel     Status: Abnormal    Collection Time: 01/01/18  1:59 AM  Result Value Ref Range    Sodium 138 135 - 145 mmol/L    Potassium 3.7 3.5 - 5.1 mmol/L    Chloride 93 (L) 98 - 111 mmol/L    CO2 34 (H) 22 - 32 mmol/L    Glucose, Bld 121 (H) 70 - 99 mg/dL    BUN 54 (H) 6 - 20 mg/dL    Creatinine, Ser 1.98 (H) 0.61 - 1.24 mg/dL    Calcium 8.9 8.9 - 10.3 mg/dL    GFR calc non Af Amer 37 (L) >60 mL/min    GFR calc Af Amer 43 (L) >60 mL/min      Comment: (NOTE) The eGFR has been calculated using the CKD EPI equation. This calculation has not been validated in all clinical situations. eGFR's persistently <60 mL/min signify possible Chronic Kidney Disease.      Anion gap 11 5 - 15      Comment: Performed at [facility]  [address] [place], [place] [address]  Lipid panel     Status: Abnormal    Collection Time: 01/01/18  1:59 AM  Result Value Ref Range    Cholesterol 116 0 - 200 mg/dL    Triglycerides 85 <150 mg/dL    HDL 24 (L) >40 mg/dL    Total CHOL/HDL Ratio 4.8 RATIO    VLDL 17 0 - 40 mg/dL    LDL Cholesterol 75 0 - 99 mg/dL      Comment:        Total Cholesterol/HDL:CHD Risk Coronary Heart Disease Risk Table                     Men    Women  1/2 Average Risk   3.4   3.3  Average Risk       5.0   4.4  2 X Average Risk   9.6   7.1  3 X Average Risk  23.4   11.0        Use the calculated Patient Ratio above and the CHD Risk Table to determine the patient's CHD Risk.        ATP III CLASSIFICATION (LDL):  <100     mg/dL   Optimal  100-129  mg/dL   Near or Above                    Optimal  130-159  mg/dL   Borderline  160-189  mg/dL   High  >190     mg/dL   Very High Performed at [facility] [address], [place], [place] [address]    Hemoglobin A1c     Status: Abnormal    Collection Time: 01/01/18  7:31 AM  Result Value Ref Range    Hgb A1c MFr Bld 7.8 (H) 4.8 - 5.6 %      Comment: (NOTE)         Prediabetes: 5.7 - 6.4         Diabetes: >6.4         Glycemic control for adults with diabetes: <7.0      Mean Plasma Glucose 177 mg/dL      Comment: (NOTE) Performed At: [facility] [address] [place], [place] [id number] Rush Farmer MD [id number]    Glucose, capillary     Status: Abnormal    Collection Time: 01/01/18  9:21 AM  Result Value Ref Range    Glucose-Capillary 112 (H) 70 - 99 mg/dL  Glucose, capillary     Status: Abnormal    Collection Time: 01/01/18 12:43 PM  Result Value Ref Range    Glucose-Capillary 149 (H) 70 - 99 mg/dL  Glucose, capillary     Status: Abnormal    Collection Time: 01/01/18  5:08 PM  Result Value Ref Range    Glucose-Capillary 229 (H) 70 - 99 mg/dL  Glucose, capillary     Status: Abnormal    Collection Time: 01/01/18  9:47 PM  Result Value Ref Range    Glucose-Capillary 178 (H) 70 - 99 mg/dL    Comment 1 Notify RN      Comment 2 Document in Chart    Basic metabolic panel     Status: Abnormal    Collection Time: 01/02/18  4:36 AM  Result Value Ref Range    Sodium 140 135 - 145 mmol/L    Potassium 4.0 3.5 - 5.1 mmol/L    Chloride 94 (L) 98 - 111 mmol/L  CO2 32 22 - 32 mmol/L    Glucose, Bld 190 (H) 70 - 99 mg/dL    BUN 60 (H) 6 - 20 mg/dL     Creatinine, Ser 1.73 (H) 0.61 - 1.24 mg/dL    Calcium 9.3 8.9 - 10.3 mg/dL    GFR calc non Af Amer 43 (L) >60 mL/min    GFR calc Af Amer 50 (L) >60 mL/min      Comment: (NOTE) The eGFR has been calculated using the CKD EPI equation. This calculation has not been validated in all clinical situations. eGFR's persistently <60 mL/min signify possible Chronic Kidney Disease.      Anion gap 14 5 - 15      Comment: Performed at [facility] [address] [place], [place] [address]  Glucose, capillary     Status: Abnormal    Collection Time: 01/02/18  7:27 AM  Result Value Ref Range    Glucose-Capillary 152 (H) 70 - 99 mg/dL  Glucose, capillary     Status: Abnormal    Collection Time: 01/02/18 12:08 PM  Result Value Ref Range    Glucose-Capillary 173 (H) 70 - 99 mg/dL       Imaging Results (Last 48 hours)  Mr Jodene Nam Head Wo Contrast   Result Date: 01/01/2018 CLINICAL DATA:  Stroke follow-up EXAM: MRA HEAD WITHOUT CONTRAST TECHNIQUE: Angiographic images of the Circle of Willis were obtained using MRA technique without intravenous contrast. COMPARISON:  Brain MRI yesterday FINDINGS: Symmetric carotid and vertebral arteries. Vessels are smooth and widely patent. Negative for aneurysm. IMPRESSION: Negative intracranial MRA. Electronically Signed   By: Monte Fantasia M.D.   On: 01/01/2018 08:32    Mr Cervical Spine Wo Contrast   Result Date: 01/01/2018 CLINICAL DATA:  Painful myelopathy EXAM: MRI CERVICAL SPINE WITHOUT CONTRAST TECHNIQUE: Multiplanar, multisequence MR imaging of the cervical spine was performed. No intravenous contrast was administered. COMPARISON:  Cervical spine CT 09/27/2009 FINDINGS: Alignment: Reversal of cervical lordosis with 1-2 mm of C3-4 anterolisthesis. Vertebrae: No fracture, evidence of discitis, or bone lesion. Cord: T2 hyperintensity about the compressed cord at C4-5. Posterior Fossa, vertebral arteries, paraspinal tissues: Trace prevertebral edema at C3  and C4, possibly reflecting recent injury. There is no indication of underlying bony infection or discitis. Disc levels: C2-3: Facet spurring.  Negative disc.  Foraminal narrowing is mild. C3-4: Facet spurring asymmetric to the left with mild anterolisthesis. Mild uncovertebral spurring. Foramina are patent. C4-5: Disc narrowing with central protrusion compressing the edematous cord. Cord compression is severe, with a 2 mm canal diameter in the midline. Disc height loss and uncovertebral spurring causes right more than left foraminal impingement C5-6: Disc narrowing and endplate degeneration with bulge compressing the cord. Bilateral foraminal impingement from disc height loss and uncovertebral spurring. C6-7: Disc narrowing and endplate degeneration with posterior disc osteophyte complex contacting the ventral cord. Mild bilateral foraminal narrowing C7-T1:Minor facet spurring.  Mild left foraminal stenosis. Case discussed with Dr. Erlinda Hong via telephone at time of signing IMPRESSION: 1. C4-5 severe cord compression due to disc protrusion, with cord edema. 2. C5-6 degenerative cord compression. 3. C6-7 mild degenerative spinal stenosis. 4. Slight prevertebral edema, question recent trauma. No evidence of discitis. 5. Bilateral foraminal impingement at C4-5 and C5-6. Electronically Signed   By: Monte Fantasia M.D.   On: 01/01/2018 08:07    Mr Thoracic Spine Wo Contrast   Result Date: 01/01/2018 CLINICAL DATA:  Acute or progressive myelopathy. Worsening weakness in both legs in severe  pain. EXAM: MRI THORACIC SPINE WITHOUT CONTRAST TECHNIQUE: Multiplanar, multisequence MR imaging of the thoracic spine was performed. No intravenous contrast was administered. COMPARISON:  None. FINDINGS: Alignment:  Normal thoracic alignment. Counting sequence of the cervical spine shows C3-4 facet mediated anterolisthesis and advanced disc degeneration at C4-5, C5-6, and C6-7. per the time line a cervical MRI has been ordered.  Vertebrae: No fracture, evidence of discitis, or bone lesion. Cord:  Normal signal and morphology Paraspinal and other soft tissues: Intrinsic back muscle edema minimally seen in the lower thoracic spine, continuation of lumbar spine pathology as described previously. Disc levels: Generalized spondylosis. Disc height and hydration is well preserved. No notable facet arthropathy. Negative for cord impingement. There is symmetric diffuse foraminal patency. IMPRESSION: No acute finding.  No impingement or visible myelopathy. Electronically Signed   By: Monte Fantasia M.D.   On: 01/01/2018 07:49        Medical Problem List and Plan: 1.  Deconditioning secondary to exacerbation of CHF 2.  DVT Prophylaxis/Anticoagulation: Pharmaceutical: Xarelto 3. Pain Management: Tylenol as needed 4. Mood: LCSW to follow for evaluation and support 5. Neuropsych: This patient is capable of making decisions on his own behalf. 6. Skin/Wound Care: Routine pressure relief measures 7. Fluids/Electrolytes/Nutrition: Monitor I's and O's.  Check BMP in a.m.  Add nutritional supplement for low calorie malnutrition 8. NICM with acute on chronic systolic CHF: Heart healthy diet with 1200 cc fluid restriction. On Toprol, hydralazine, Imdur and Lipitor.   Monitor weights daily for stability.  Monitor for signs of overload.  Question resume furosemide in a.m. 9.  Acute on chronic renal failure: Continue to monitor renal status.   10.  A. fib/a flutter: Now in NSR.  Continue amiodarone twice daily, Toprol 75 mg daily and Xarelto 11. HTN: Monitor blood pressure twice daily.  No ACE, ARB, spironolactone due to CKD 12.  T2DM:  Hemoglobin A1c 7.1. Has been off meds since stroke. Continue to monitor BS ac/hs and use SSI for elevated BS.  13.  Morbid obesity: BMI 52.3 kg.  Educated patient on weight loss to help with mobility and manage chronic medical issues.       Post Admission Physician Evaluation: 1. Functional deficits secondary   To deconditioning from CHF with hx cervical stenosis with cord compression, lumbar spondylosis with radiculopathy, left temporal lobe infarction . 2. Patient admitted to receive collaborative, interdisciplinary care between the physiatrist, rehab nursing staff, and therapy team. 3. Patient's level of medical complexity and substantial therapy needs in context of that medical necessity cannot be provided at a lesser intensity of care. 4. Patient has experienced substantial functional loss from his/her baseline.  Judging by the patient's diagnosis, physical exam, and functional history, the patient has potential for functional progress which will result in measurable gains while on inpatient rehab.  These gains will be of substantial and practical use upon discharge in facilitating mobility and self-care at the household level. 5. Physiatrist will provide 24 hour management of medical needs as well as oversight of the therapy plan/treatment and provide guidance as appropriate regarding the interaction of the two. 6. 24 hour rehab nursing will assist in the management of  bladder management, bowel management, safety, skin/wound care, disease management, medication administration, pain management and patient education  and help integrate therapy concepts, techniques,education, etc. 7. PT will assess and treat for:pre gait, gait training, endurance , safety, equipment, neuromuscular re education  .  Goals are: supervision. 8. OT will assess and treat for ADLs,  Cognitive perceptual skills, Neuromuscular re education, safety, endurance, equipment  .  Goals are: supervision.  9. SLP will assess and treat for  .  Goals are: N/A. 10. Case Management and Social Worker will assess and treat for psychological issues and discharge planning. 11. Team conference will be held weekly to assess progress toward goals and to determine barriers to discharge. 12.  Patient will receive at least 3 hours of therapy per day at  least 5 days per week. 13. ELOS and Prognosis: 7-10d fair  "I have personally performed a face to face diagnostic evaluation of this patient.  Additionally, I have reviewed and concur with the physician assistant's documentation above." Charlett Blake M.D. [facility] FAAPM&R (Sports Med, Neuromuscular Med) Diplomate Am Board of Electrodiagnostic Med    Cathlyn Parsons, PA-C

## 2018-02-23 NOTE — Progress Notes (Signed)
Pt admitted to 4w01. No complaint of pain or discomfort at this time. Pt oriented to floor "NO FALL" floor policy, room and call bell. Pt does not seem to be in any distress at this time. Continue plan of care.  Jay Chen W [facility] [place]

## 2018-02-23 NOTE — Progress Notes (Signed)
Patient transferred to 4w with personal belongings.. Report called. Patient informed family of transfer to another department

## 2018-02-23 NOTE — Progress Notes (Signed)
Inpatient Rehabilitation  Received notification from acute medical team that patient is ready for IP Rehab, I have insurance approval for admission today, and have a bed to offer.  Patient in agreement with plan.  Will notify team and proceed with admission today.  Call if questions.   Carmelia Roller., CCC/SLP Admission Coordinator  [place]  Cell [phone]

## 2018-02-23 NOTE — NC FL2 (Signed)
[place] MEDICAID FL2 LEVEL OF CARE SCREENING TOOL     IDENTIFICATION  Patient Name: Jay Chen Birthdate: [birth year] Sex: male Admission Date (Current Location): 02/15/2018  [facility] and [place] Number:  Herbalist and Address:  [facility], [place] [address] [place], [place] [address]      Provider Number: [id number]  Attending Physician Name and Address:  Jolaine Artist, MD  Relative Name and Phone Number:       Current Level of Care: Hospital Recommended Level of Care: [place] Prior Approval Number:    Date Approved/Denied:   PASRR Number: [id number]  Discharge Plan: SNF    Current Diagnoses: Patient Active Problem List   Diagnosis Date Noted  . CHF (congestive heart failure), NYHA class III ([place]) 02/15/2018  . CKD (chronic kidney disease) stage 4, GFR 15-29 ml/min (HCC)   . Left temporal lobe infarction ([place])   . Morbid obesity ([place])   . Poorly controlled type 2 diabetes mellitus with peripheral neuropathy ([place])   . Atrial fibrillation, chronic   . Cerebral embolism with cerebral infarction 01/01/2018  . Lumbar radiculopathy   . Cervical spinal cord compression ([place])   . DCM (dilated cardiomyopathy) ([place])   . Atrial fibrillation with rapid ventricular response ([place])   . Chronic combined systolic (congestive) and diastolic (congestive) heart failure ([place])   . Acute on chronic systolic heart failure ([place]) 11/22/2017  . Bilateral edema of lower extremity 10/21/2017  . Pure hyperglyceridemia 09/01/2017  . Phimosis/redundant prepuce 06/03/2015  . Gout of big toe 09/11/2012  . Hyperlipidemia with target LDL less than 100 09/11/2012  . Routine general medical examination at a health care facility 01/19/2012  . Cor pulmonale ([place]) 01/08/2011  . HTN (hypertension) 01/08/2011  . Obesities, morbid ([place]) 01/08/2011  . Aneurysm of thoracic aorta ([place]) 01/08/2011  . Obstructive sleep apnea 08/27/2007  .  RHINITIS 08/18/2007    Orientation RESPIRATION BLADDER Height & Weight     Self, Time, Situation, Place  O2(Nasal Canula 2 L) Continent Weight: (!) 344 lb 6.4 oz (156.2 kg) Height:  5\' 8"  (172.7 cm)  BEHAVIORAL SYMPTOMS/MOOD NEUROLOGICAL BOWEL NUTRITION STATUS  (None) (None) Continent Diet(Heart healthy/carb modified. Fluid restriction 1200 mL.)  AMBULATORY STATUS COMMUNICATION OF NEEDS Skin   Limited Assist Verbally Other (Comment)(Blister.)                       Personal Care Assistance Level of Assistance  Bathing, Feeding, Dressing Bathing Assistance: Limited assistance Feeding assistance: Independent Dressing Assistance: Limited assistance     Functional Limitations Info  Sight, Hearing, Speech Sight Info: Adequate Hearing Info: Adequate Speech Info: Adequate    SPECIAL CARE FACTORS FREQUENCY  PT (By licensed PT), OT (By licensed OT)     PT Frequency: 5 x week OT Frequency: 5 x week            Contractures Contractures Info: Not present    Additional Factors Info  Code Status, Allergies Code Status Info: Full code Allergies Info: NKDA           Current Medications (02/23/2018):  This is the current hospital active medication list Current Facility-Administered Medications  Medication Dose Route Frequency Provider Last Rate Last Dose  . 0.9 %  sodium chloride infusion  250 mL Intravenous PRN Bensimhon, Shaune Pascal, MD 10 mL/hr at 02/20/18 1016    . 0.9 %  sodium chloride infusion  250 mL Intravenous PRN Bensimhon, Quillian Quince  R, MD      . acetaminophen (TYLENOL) tablet 650 mg  650 mg Oral Q4H PRN Bensimhon, Shaune Pascal, MD   650 mg at 02/22/18 2246  . allopurinol (ZYLOPRIM) tablet 100 mg  100 mg Oral Daily Bensimhon, Shaune Pascal, MD   100 mg at 02/23/18 1004  . amiodarone (PACERONE) tablet 200 mg  200 mg Oral BID Clegg, Amy D, NP   200 mg at 02/23/18 1004  . aspirin EC tablet 81 mg  81 mg Oral Daily Bensimhon, Shaune Pascal, MD   81 mg at 02/23/18 1004  . atorvastatin  (LIPITOR) tablet 80 mg  80 mg Oral q1800 Bensimhon, Shaune Pascal, MD   80 mg at 02/22/18 1812  . guaiFENesin-dextromethorphan (ROBITUSSIN DM) 100-10 MG/5ML syrup 5 mL  5 mL Oral Q4H PRN Bensimhon, Shaune Pascal, MD   5 mL at 02/16/18 0912  . hydrALAZINE (APRESOLINE) tablet 75 mg  75 mg Oral Q8H Bensimhon, Shaune Pascal, MD   75 mg at 02/23/18 0603  . insulin aspart (novoLOG) injection 0-15 Units  0-15 Units Subcutaneous TID WC Bensimhon, Shaune Pascal, MD   2 Units at 02/23/18 [phone]  . insulin aspart (novoLOG) injection 0-5 Units  0-5 Units Subcutaneous QHS Bensimhon, Shaune Pascal, MD      . isosorbide mononitrate (IMDUR) 24 hr tablet 30 mg  30 mg Oral Daily Bensimhon, Shaune Pascal, MD   30 mg at 02/23/18 1004  . metoprolol succinate (TOPROL-XL) 24 hr tablet 75 mg  75 mg Oral Daily Bensimhon, Shaune Pascal, MD   75 mg at 02/23/18 1004  . omega-3 acid ethyl esters (LOVAZA) capsule 1 g  1 g Oral BID Bensimhon, Shaune Pascal, MD   1 g at 02/23/18 1004  . ondansetron (ZOFRAN) injection 4 mg  4 mg Intravenous Q6H PRN Bensimhon, Shaune Pascal, MD      . potassium chloride SA (K-DUR,KLOR-CON) CR tablet 40 mEq  40 mEq Oral Daily Georgiana Shore, NP   40 mEq at 02/23/18 1004  . rivaroxaban (XARELTO) tablet 20 mg  20 mg Oral Q supper Bensimhon, Shaune Pascal, MD   20 mg at 02/22/18 1812  . sodium chloride flush (NS) 0.9 % injection 3 mL  3 mL Intravenous Q12H Bensimhon, Shaune Pascal, MD   3 mL at 02/23/18 1006  . sodium chloride flush (NS) 0.9 % injection 3 mL  3 mL Intravenous PRN Bensimhon, Shaune Pascal, MD      . sodium chloride flush (NS) 0.9 % injection 3 mL  3 mL Intravenous Q12H Bensimhon, Shaune Pascal, MD   3 mL at 02/23/18 1006  . sodium chloride flush (NS) 0.9 % injection 3 mL  3 mL Intravenous PRN Bensimhon, Shaune Pascal, MD         Discharge Medications: Please see discharge summary for a list of discharge medications.  Relevant Imaging Results:  Relevant Lab Results:   Additional Information SS#: [id number]. On an air mattress.  Candie Chroman,  LCSW

## 2018-02-23 NOTE — Progress Notes (Signed)
Physical Therapy Treatment Patient Details Name: Jay Chen MRN: [id number] DOB: Sep 12, 1965 Today's Date: 02/23/2018    History of Present Illness 52yo male admitted from [facility] clinic on 02/15/18 due to acute on chronic CHF and volume overload, DCCV 10/2. PMH AAA, CVA, HTN, DM, CKD, morbid obesity, EF 20-25%     PT Comments    Pt pleasant on arrival, motivated and willing to participate with therapy. Pt able to progress ambulation to two 40 ft trials with seated rest break. Second ambulation trial showed increased steadiness. Pt reports walks about 150 ft at a time during exercise and does every day. Pt with five sit/stand trials progressing from mod assist to min guard throughout. Pt shows great potential for CIR to progress independence and functional mobility.     Follow Up Recommendations  CIR     Equipment Recommendations  Other (comment)(defer to next venue, pt uses rolaltor at home )    Recommendations for Other Services       Precautions / Restrictions Precautions Precautions: Fall Restrictions Weight Bearing Restrictions: No    Mobility  Bed Mobility Overal bed mobility: Modified Independent Bed Mobility: Supine to Sit     Supine to sit: HOB elevated;Modified independent (Device/Increase time)     General bed mobility comments: increased time with HOB 20 degrees  Transfers Overall transfer level: Needs assistance Equipment used: Rolling walker (2 wheeled) Transfers: Sit to/from Stand Sit to Stand: Mod assist;Min assist;Min guard         General transfer comment: mod assist initial stand from bed min x 2 additional trials from chair and last 2 standing trials from chair minguard. Cues for hand placement increased time to rise and for anterior translation  Ambulation/Gait Ambulation/Gait assistance: Min guard;+2 safety/equipment Gait Distance (Feet): 40 Feet Assistive device: Rolling walker (2 wheeled) Gait Pattern/deviations: Step-through  pattern;Decreased stride length;Wide base of support;Trunk flexed Gait velocity: decreased  Gait velocity interpretation: <1.8 ft/sec, indicate of risk for recurrent falls General Gait Details: cues for posture, breathing and chair follow to maximize gait. Pt walked 2 trials of 40' with seated rest between   Stairs             Wheelchair Mobility    Modified Rankin (Stroke Patients Only)       Balance Overall balance assessment: Needs assistance Sitting-balance support: Bilateral upper extremity supported Sitting balance-Leahy Scale: Fair Sitting balance - Comments: can remove UE support for brief periods      Standing balance-Leahy Scale: Poor Standing balance comment: heavy bil UE reliance on RW for standing                             Cognition Arousal/Alertness: Awake/alert Behavior During Therapy: WFL for tasks assessed/performed Overall Cognitive Status: Within Functional Limits for tasks assessed                                        Exercises      General Comments        Pertinent Vitals/Pain Pain Assessment: No/denies pain    Home Living                      Prior Function            PT Goals (current goals can now be found in the care plan section) Progress towards  PT goals: Progressing toward goals    Frequency    Min 3X/week      PT Plan Current plan remains appropriate    Co-evaluation              AM-PAC PT "6 Clicks" Daily Activity  Outcome Measure  Difficulty turning over in bed (including adjusting bedclothes, sheets and blankets)?: A Little Difficulty moving from lying on back to sitting on the side of the bed? : A Lot Difficulty sitting down on and standing up from a chair with arms (e.g., wheelchair, bedside commode, etc,.)?: A Lot Help needed moving to and from a bed to chair (including a wheelchair)?: A Little Help needed walking in hospital room?: A Little Help needed  climbing 3-5 steps with a railing? : Total 6 Click Score: 14    End of Session Equipment Utilized During Treatment: Gait belt;Oxygen Activity Tolerance: Patient tolerated treatment well;Patient limited by fatigue Patient left: in chair;with call bell/phone within reach Nurse Communication: Mobility status;Other (comment)(permission to give pt juice ) PT Visit Diagnosis: Unsteadiness on feet (R26.81);Muscle weakness (generalized) (M62.81);Difficulty in walking, not elsewhere classified (R26.2)     Time: 1448-1856 PT Time Calculation (min) (ACUTE ONLY): 22 min  Charges:  $Gait Training: 8-22 mins                     Samuella Bruin, [place]  Acute Rehab [phone]    Samuella Bruin 02/23/2018, 9:14 AM

## 2018-02-23 NOTE — Progress Notes (Signed)
CARDIAC REHAB PHASE I   PRE:  Rate/Rhythm: 74 SR    BP: sitting 120/79    SaO2: 96 2L, 93 RA  MODE:  Ambulation: 60 ft   POST:  Rate/Rhythm: 72 SR    BP: sitting 129/97      SaO2: 91 RA  Pt willing to walk. Used RW and standby assist x2. Slow pace, small steps. Had chair waiting for him in hall and he sat to rest after 30 ft. Return to recliner. No major c/o. Back pain limits him. Left off O2. [id number]   Darrick Meigs CES, ACSM 02/23/2018 1:59 PM

## 2018-02-23 NOTE — Anesthesia Postprocedure Evaluation (Signed)
Anesthesia Post Note  Patient: Jay Chen  Procedure(s) Performed: CARDIOVERSION (N/A )     Patient location during evaluation: PACU Anesthesia Type: General Level of consciousness: awake and alert, awake and oriented Pain management: pain level controlled Vital Signs Assessment: post-procedure vital signs reviewed and stable Respiratory status: spontaneous breathing, nonlabored ventilation and respiratory function stable Cardiovascular status: blood pressure returned to baseline and stable Postop Assessment: no apparent nausea or vomiting Anesthetic complications: no    Last Vitals:  Vitals:   02/22/18 2246 02/23/18 0438  BP: 125/81 125/89  Pulse: 67 80  Resp:  18  Temp:  36.9 C  SpO2:  95%    Last Pain:  Vitals:   02/23/18 0438  TempSrc: Oral  PainSc:                  Catalina Gravel

## 2018-02-23 NOTE — Progress Notes (Signed)
Inpatient Rehabilitation  Received notification that patient is medically ready for IP Rehab.  I have requested OT to evaluate in order to initiate insurance authorization.  Plan to follow up with that patient later today.  Call if questions.   Carmelia Roller., CCC/SLP Admission Coordinator  [place]  Cell [phone]

## 2018-02-23 NOTE — Progress Notes (Signed)
Per CCMD patient alarms are showing QTc around 700. Pt with stable VS, asymptomatic, no pain 0/10. EKG done, did not transfer To Epic however EKG done and placed on the chart.  QTc shows 521 on the EKG. Paged NP on call [name] CHF and informed her. No new orders at this time.  Terion Hedman, RN

## 2018-02-23 NOTE — Clinical Social Work Note (Signed)
Per CIR admissions coordinator, patient has insurance approval to discharge to CIR today.  CSW signing off.   Dayton Scrape, [place]

## 2018-02-23 NOTE — Discharge Summary (Addendum)
Advanced Heart Failure Team  Discharge Summary   Patient ID: Jay Chen MRN: [id number], DOB/AGE: [birth year] 52 y.o. Admit date: 02/15/2018 D/C date:     02/23/2018   Primary Discharge Diagnoses:  1. A/C Combined Heart Failure, NICM  ECHO EF 20-25%.  2. AKI on CKD III  Creatinine baseline 1.7-1.9  3. Persistent A fib/A flutter  S/P DC-CV 02/22/2018   On Xarelto  4. H/O CVA 5. HTN 6. AAA  4.7 cm on ECHO Needs CT if creatinine improves 7. OSA 8. DMII 9. Mobid Obesity  Hospital Course: Jay Chen a 52 y.o.malewith acute on chronic combined CHF,presumed non-ischemic (no cath),EF 20-25%, AAA 4.7 cm by echo, hx of CVA, HTN, HLD, OSA, DM2, CKD III (Baseline Cr 1.7 - 1.9),and morbid obesity (Body mass index is 56.56 kg/m.)  Admitted from [facility] clinic 02/15/18 with acute on chronic combined CHF and marked volume overload.Diuresed with IV lasix 40 pounds. Diuretics held on the day of discharge due to increased creatinine. Plan to start lasix 40 mg tomorrow.  Jay Chen underwent RHC/LHC with normal cors and moderate mixed pulmonary venous/arterial HTN.  HF medications optimized. Jay Chen was not placed on spiro or arb due to elevated creatinine.   Once diuresed Jay Chen underwent successful DC-CV. Jay Chen will continue amiodarone 200 mg twice a day + Toprol XL 75 mg daily. Jay Chen will remain on xarelto.   Jay Chen was evaluated by PT with recommendations for CIR. Jay Chen was evaluated and deemed appropriate for CIR. Jay Chen was discharged to CIR today in stable condition.   Discharge Weight: 344 pounds.  Discharge Vitals: Blood pressure 113/68, pulse 74, temperature 97.8 F (36.6 C), temperature source Oral, resp. rate 18, height _0  (1.727 m), weight (!) 156.2 kg, SpO2 96 %.  Labs: Lab Results  Component Value Date   WBC 7.5 02/21/2018   HGB 13.1 02/21/2018   HCT 41.9 02/21/2018   MCV 100.0 02/21/2018   PLT 262 02/21/2018    Recent Labs  Lab 02/23/18 0530  NA 141  K 3.7  CL 97*  CO2 37*  BUN 45*   CREATININE 2.12*  CALCIUM 8.7*  GLUCOSE 128*   Lab Results  Component Value Date   CHOL 116 01/01/2018   HDL 24 (L) 01/01/2018   LDLCALC 75 01/01/2018   TRIG 85 01/01/2018   BNP (last 3 results) Recent Labs    12/27/17 2354 02/15/18 1559  BNP 283.3* 847.1*    ProBNP (last 3 results) No results for input(s): PROBNP in the last 8760 hours.   Diagnostic Studies/Procedures    ECHO 02/01/2018  Left ventricle: The cavity size was moderately to severely   dilated. Wall thickness was increased in a pattern of moderate   LVH. Systolic function was severely reduced. The estimated   ejection fraction was in the range of 20% to 25%. Diffuse   hypokinesis. The study is not technically sufficient to allow   evaluation of LV diastolic function. - Aortic valve: Mildly calcified annulus. Valve area (VTI): 4.07   cm^2. Valve area (Vmax): 4.14 cm^2. Valve area (Vmean): 4.16   cm^2. - Aorta: There is a single still frame view of a portion of the   proximal ascending that is dilated measuring 4.7 cm, consistent   with moderate to large aneurysm. From chart review a similar   aneurysm was detected by CT scan 03/28/2012. Limited evaluation by   echo, consider alternative modality if more complete evaluation   is indicated. - Left atrium: The atrium was severely  dilated. - Right ventricle: The cavity size was mildly dilated. Systolic   function was mildly reduced. - Right atrium: The atrium was mildly to moderately dilated. - Atrial septum: No defect or patent foramen ovale was identified. - Inferior vena cava: The vessel was dilated. The respirophasic   diameter changes were blunted (< 50%), consistent with elevated   central venous pressure. RHC/LHC 02/20/2018  Ao = 125/87 (104) LV = 122/23  RA = 17  RV = 56/16 PA = 52/35 (42) PCW = 21 Fick cardiac output/index = 7.0.2.7 SVR = 993 PVR = 3.0 WU Ao sat = 92% PA sat = 66%, 65%  Assessment: 1. Normal coronaries 2. Severe NICM  EF 20-25% by echo 3. Heart appears rotated 90% leftward on cath images 4. Moderate mixed pulmonary venous/arterial HTN  Discharge Medications   Allergies as of 02/23/2018   No Known Allergies     Medication List    STOP taking these medications   ACCU-CHEK GUIDE w/Device Kit   furosemide 40 MG tablet Commonly known as:  LASIX   glucose blood test strip   magnesium oxide 400 MG tablet Commonly known as:  MAG-OX     TAKE these medications   acetaminophen 325 MG tablet Commonly known as:  TYLENOL Take 2 tablets (650 mg total) by mouth every 6 (six) hours as needed for mild pain or headache (or Fever >/= 101).   allopurinol 100 MG tablet Commonly known as:  ZYLOPRIM Take 1 tablet (100 mg total) by mouth daily.   amiodarone 200 MG tablet Commonly known as:  PACERONE Take 1 tablet (200 mg total) by mouth 2 (two) times daily. What changed:  when to take this   aspirin 81 MG EC tablet Take 1 tablet (81 mg total) by mouth daily.   atorvastatin 80 MG tablet Commonly known as:  LIPITOR Take 1 tablet (80 mg total) by mouth daily.   guaiFENesin-dextromethorphan 100-10 MG/5ML syrup Commonly known as:  ROBITUSSIN DM Take 5 mLs by mouth every 4 (four) hours as needed for cough.   hydrALAZINE 25 MG tablet Commonly known as:  APRESOLINE Take 3 tablets (75 mg total) by mouth every 8 (eight) hours.   insulin aspart 100 UNIT/ML injection Commonly known as:  novoLOG Inject 0-15 Units into the skin 3 (three) times daily with meals.   insulin aspart 100 UNIT/ML injection Commonly known as:  novoLOG Inject 0-5 Units into the skin at bedtime.   isosorbide mononitrate 30 MG 24 hr tablet Commonly known as:  IMDUR Take 1 tablet (30 mg total) by mouth daily.   methocarbamol 500 MG tablet Commonly known as:  ROBAXIN Take 1 tablet (500 mg total) by mouth 3 (three) times daily. What changed:  when to take this   metoprolol succinate 25 MG 24 hr tablet Commonly known as:   TOPROL-XL Take 3 tablets (75 mg total) by mouth daily. What changed:    medication strength  how much to take  how to take this  when to take this  additional instructions   omega-3 acid ethyl esters 1 g capsule Commonly known as:  LOVAZA Take 2 capsules (2 g total) by mouth 2 (two) times daily.   ondansetron 4 MG/2ML Soln injection Commonly known as:  ZOFRAN Inject 2 mLs (4 mg total) into the vein every 6 (six) hours as needed for nausea.   potassium chloride SA 20 MEQ tablet Commonly known as:  K-DUR,KLOR-CON Take 2 tablets (40 mEq total) by mouth daily.  rivaroxaban 20 MG Tabs tablet Commonly known as:  XARELTO Take 1 tablet (20 mg total) by mouth daily with supper.       Disposition   The patient will be discharged in stable condition to CIR  Discharge Instructions    (HEART FAILURE PATIENTS) Call MD:  Anytime you have any of the following symptoms: 1) 3 pound weight gain in 24 hours or 5 pounds in 1 week 2) shortness of breath, with or without a dry hacking cough 3) swelling in the hands, feet or stomach 4) if you have to sleep on extra pillows at night in order to breathe.   Complete by:  As directed    Diet - low sodium heart healthy   Complete by:  As directed    Increase activity slowly   Complete by:  As directed          Duration of Discharge Encounter: Greater than 35 minutes   Signed, Amy Clegg NP-C  02/23/2018, 2:54 PM   Agree with above. Jay Chen is ready for d/c to CIR.   Glori Bickers, MD  6:14 PM

## 2018-02-24 ENCOUNTER — Inpatient Hospital Stay (HOSPITAL_COMMUNITY): Payer: BC Managed Care – PPO | Admitting: Occupational Therapy

## 2018-02-24 ENCOUNTER — Telehealth: Payer: Self-pay | Admitting: *Deleted

## 2018-02-24 ENCOUNTER — Inpatient Hospital Stay (HOSPITAL_COMMUNITY): Payer: BC Managed Care – PPO | Admitting: Physical Therapy

## 2018-02-24 DIAGNOSIS — I5043 Acute on chronic combined systolic (congestive) and diastolic (congestive) heart failure: Secondary | ICD-10-CM

## 2018-02-24 DIAGNOSIS — E1169 Type 2 diabetes mellitus with other specified complication: Secondary | ICD-10-CM

## 2018-02-24 DIAGNOSIS — N183 Chronic kidney disease, stage 3 (moderate): Secondary | ICD-10-CM

## 2018-02-24 DIAGNOSIS — I4819 Other persistent atrial fibrillation: Secondary | ICD-10-CM

## 2018-02-24 DIAGNOSIS — E669 Obesity, unspecified: Secondary | ICD-10-CM

## 2018-02-24 LAB — COMPREHENSIVE METABOLIC PANEL
ALBUMIN: 2.5 g/dL — AB (ref 3.5–5.0)
ALT: 58 U/L — ABNORMAL HIGH (ref 0–44)
AST: 39 U/L (ref 15–41)
Alkaline Phosphatase: 93 U/L (ref 38–126)
Anion gap: 7 (ref 5–15)
BUN: 43 mg/dL — ABNORMAL HIGH (ref 6–20)
CALCIUM: 8.8 mg/dL — AB (ref 8.9–10.3)
CO2: 36 mmol/L — AB (ref 22–32)
Chloride: 98 mmol/L (ref 98–111)
Creatinine, Ser: 1.93 mg/dL — ABNORMAL HIGH (ref 0.61–1.24)
GFR calc Af Amer: 44 mL/min — ABNORMAL LOW (ref >60)
GFR, EST NON AFRICAN AMERICAN: 38 mL/min — AB (ref >60)
GLUCOSE: 140 mg/dL — AB (ref 70–99)
POTASSIUM: 4 mmol/L (ref 3.5–5.1)
Sodium: 141 mmol/L (ref 135–145)
Total Bilirubin: 1.3 mg/dL — ABNORMAL HIGH (ref 0.3–1.2)
Total Protein: 6.2 g/dL — ABNORMAL LOW (ref 6.5–8.1)

## 2018-02-24 LAB — CBC WITH DIFFERENTIAL/PLATELET
Abs Immature Granulocytes: 0 10*3/uL (ref 0.0–0.1)
BASOS ABS: 0.1 10*3/uL (ref 0.0–0.1)
BASOS PCT: 1 %
EOS ABS: 0.2 10*3/uL (ref 0.0–0.7)
Eosinophils Relative: 3 %
HCT: 41.4 % (ref 39.0–52.0)
Hemoglobin: 12.7 g/dL — ABNORMAL LOW (ref 13.0–17.0)
IMMATURE GRANULOCYTES: 0 %
Lymphocytes Relative: 18 %
Lymphs Abs: 1.1 10*3/uL (ref 0.7–4.0)
MCH: 31 pg (ref 26.0–34.0)
MCHC: 30.7 g/dL (ref 30.0–36.0)
MCV: 101 fL — ABNORMAL HIGH (ref 78.0–100.0)
Monocytes Absolute: 0.7 10*3/uL (ref 0.1–1.0)
Monocytes Relative: 12 %
NEUTROS PCT: 66 %
Neutro Abs: 4.1 10*3/uL (ref 1.7–7.7)
PLATELETS: 303 10*3/uL (ref 150–400)
RBC: 4.1 MIL/uL — AB (ref 4.22–5.81)
RDW: 15.8 % — AB (ref 11.5–15.5)
WBC: 6.2 10*3/uL (ref 4.0–10.5)

## 2018-02-24 LAB — GLUCOSE, CAPILLARY
Glucose-Capillary: 131 mg/dL — ABNORMAL HIGH (ref 70–99)
Glucose-Capillary: 86 mg/dL (ref 70–99)
Glucose-Capillary: 100 mg/dL — ABNORMAL HIGH (ref 70–99)
Glucose-Capillary: 104 mg/dL — ABNORMAL HIGH (ref 70–99)

## 2018-02-24 MED ORDER — TORSEMIDE 20 MG PO TABS
40.0000 mg | ORAL_TABLET | Freq: Every day | ORAL | Status: DC
  Administered 2018-02-24 – 2018-03-01 (×6): 40 mg via ORAL
  Filled 2018-02-24 (×6): qty 2

## 2018-02-24 NOTE — IPOC Note (Signed)
Overall Plan of Care [facility]) Patient Details Name: Jay Chen MRN: [id number] DOB: [birth year]  Admitting Diagnosis: <principal problem not specified>  Hospital Problems: Active Problems:   Debility     Functional Problem List: Nursing Bowel, Bladder, Edema, Skin Integrity, Pain, Endurance, Motor  PT Balance, Endurance, Motor, Safety  OT Balance, Endurance, Motor  SLP    TR         Basic ADL's: OT Bathing, Dressing, Toileting     Advanced  ADL's: OT Simple Meal Preparation     Transfers: PT Bed Mobility, Bed to Chair, Car, Manufacturing systems engineer, Metallurgist: PT Ambulation, Stairs     Additional Impairments: OT None  SLP        TR      Anticipated Outcomes Item Anticipated Outcome  Self Feeding no goal, pt is independent  Swallowing      Basic self-care  mod I  Toileting  mod I   Bathroom Transfers mod I  Bowel/Bladder  mod I assist  Transfers  Mod(I) with LRAD   Locomotion  Mod(I) with LRAD   Communication     Cognition     Pain  < 4  Safety/Judgment  Mod I assist   Therapy Plan: PT Intensity: Minimum of 1-2 x/day ,45 to 90 minutes PT Frequency: 5 out of 7 days PT Duration Estimated Length of Stay: 7-9 days  OT Intensity: Minimum of 1-2 x/day, 45 to 90 minutes OT Frequency: 5 out of 7 days OT Duration/Estimated Length of Stay: 7-9 days      Team Interventions: Nursing Interventions Patient/Family Education, Bladder Management, Bowel Management, Medication Management, Pain Management, Skin Care/Wound Management, Disease Management/Prevention  PT interventions Ambulation/gait training, Pain management, Stair training, Training and development officer, DME/adaptive equipment instruction, Patient/family education, Therapeutic Activities, Wheelchair propulsion/positioning, Functional electrical stimulation, Therapeutic Exercise, Psychosocial support, Community reintegration, Functional mobility training, UE/LE Strength taining/ROM,  Discharge planning, Neuromuscular re-education, UE/LE Coordination activities  OT Interventions Training and development officer, Discharge planning, DME/adaptive equipment instruction, Functional mobility training, Patient/family education, Self Care/advanced ADL retraining, Therapeutic Activities, Therapeutic Exercise, UE/LE Strength taining/ROM  SLP Interventions    TR Interventions    SW/CM Interventions Discharge Planning, Psychosocial Support, Patient/Family Education   Barriers to Discharge MD  Medical stability  Nursing Weight, Medication compliance    PT      OT      SLP      SW       Team Discharge Planning: Destination: PT-Home ,OT- Home , SLP-  Projected Follow-up: PT-Home health PT, OT-  Home health OT, SLP-  Projected Equipment Needs: PT-None recommended by PT, OT- None recommended by OT, SLP-  Equipment Details: PT- , OT-  Patient/family involved in discharge planning: PT- Patient,  OT-Patient, SLP-   MD ELOS: 7-9 days Medical Rehab Prognosis:  Excellent Assessment: The patient has been admitted for CIR therapies with the diagnosis of debility related to CHF, hx of cervical myelopathy. The team will be addressing functional mobility, strength, stamina, balance, safety, adaptive techniques and equipment, self-care, bowel and bladder mgt, patient and caregiver education, NMR, activity tolerance, pain control, community reentry. Goals have been set at mod I for mobility and self-care.    Meredith Staggers, MD, FAAPMR      See Team Conference Notes for weekly updates to the plan of care

## 2018-02-24 NOTE — Progress Notes (Signed)
[facility] PHYSICAL MEDICINE & REHABILITATION PROGRESS NOTE   Subjective/Complaints: Had a good night. Complains of mild back pain. Breathing well.   ROS: Patient denies fever, rash, sore throat, blurred vision, nausea, vomiting, diarrhea, cough, shortness of breath or chest pain,  headache, or mood change.    Objective:   No results found. Recent Labs    02/24/18 0624  WBC 6.2  HGB 12.7*  HCT 41.4  PLT 303   Recent Labs    02/23/18 0530 02/24/18 0624  NA 141 141  K 3.7 4.0  CL 97* 98  CO2 37* 36*  GLUCOSE 128* 140*  BUN 45* 43*  CREATININE 2.12* 1.93*  CALCIUM 8.7* 8.8*    Intake/Output Summary (Last 24 hours) at 02/24/2018 1013 Last data filed at 02/24/2018 0800 Gross per 24 hour  Intake 816 ml  Output 450 ml  Net 366 ml     Physical Exam: Vital Signs Blood pressure 108/72, pulse 71, temperature 98.5 F (36.9 C), temperature source Oral, resp. rate 18, height 5\' 8"  (1.727 m), weight (!) 159.8 kg, SpO2 91 %.  Constitutional: No distress . Vital signs reviewed. obese HEENT: EOMI, oral membranes moist Neck: supple Cardiovascular: RRR without murmur. No JVD    Respiratory: CTA Bilaterally without wheezes or rales. Normal effort    GI: BS +, non-tender, non-distended  Neurological: He isalertand oriented to person, place, and time. Follows full commands. Fair awareness of deficits Skin: Skin iswarmand dry. Blister on left shin Motor 4/5 UE bilaterally. LE: 3+ prox to 4/5 distally.  Sensation intact in all 4's Psych: pleasant   Assessment/Plan: 1. Functional deficits secondary to debility which require 3+ hours per day of interdisciplinary therapy in a comprehensive inpatient rehab setting.  Physiatrist is providing close team supervision and 24 hour management of active medical problems listed below.  Physiatrist and rehab team continue to assess barriers to discharge/monitor patient progress toward functional and medical goals  Care  Tool:  Bathing              Bathing assist       Upper Body Dressing/Undressing Upper body dressing        Upper body assist      Lower Body Dressing/Undressing Lower body dressing      What is the patient wearing?: Hospital gown only     Lower body assist       Toileting Toileting    Toileting assist Assist for toileting: Minimal Assistance - Patient > 75%(pt used urinal, i emptied )     Transfers Chair/bed transfer  Transfers assist     Chair/bed transfer assist level: Minimal Assistance - Patient > 75%     Locomotion Ambulation   Ambulation assist      Assist level: Moderate Assistance - Patient 50 - 74% Assistive device: Walker-standard     Walk 10 feet activity   Assist     Assist level: Moderate Assistance - Patient - 50 - 74%     Walk 50 feet activity   Assist           Walk 150 feet activity   Assist           Walk 10 feet on uneven surface  activity   Assist           Wheelchair     Assist               Wheelchair 50 feet with 2 turns activity    Assist  Wheelchair 150 feet activity     Assist            Medical Problem List and Plan: 1.Decreased functional mobilitysecondary to cervical stenosis with cord compression, lumbar spondylosis with radiculopathy, left temporal lobe infarction.Patient is to follow-up outpatient with Dr. Kathyrn Sheriff to discuss possible surgical interventions for lumbar radiculopathy  -beginning therapies today  -expect short LOS 2. DVT Prophylaxis/Anticoagulation: Xarelto. 3. Pain Management:Robaxin 500 mg 3 times daily, oxycodone as needed 4. Mood:Provide emotional support 5. Neuropsych: This patientiscapable of making decisions on hisown behalf. 6. Skin/Wound Care:Routine skin checks 7. Fluids/Electrolytes/Nutrition:  -I personally reviewed the patient's labs today.   8.Persistent atrial fibrillation. HR controlled.  Amiodarone 200 mg twice daily,Toprol 100mg  daily 9.Chronic systolic and diastolic congestive heart failure. Monitor for any signs of fluid overload. Lasix 40 mg Daily  -daily weights   Filed Weights   02/23/18 1751 02/24/18 0510  Weight: (!) 156.4 kg (!) 159.8 kg    10.LLLpneumonia. Follow-up chest x-ray 01/03/2018 with no acute process. Completed course of Omnicef and doxycycline 11. Diabetes mellitus with peripheral neuropathy. Hemoglobin A1c 8.4. SSI. Check blood sugars before meals and at bedtime. Patient on Glucophage 1000 mg twice daily prior to admission currently on hold due to renal insufficiency 12.CKD stage III. Follow-up chemistries---BUN/Cr stable 43/1.9 10/4 13.Hyperlipidemia. Lovaza/Lipitor 14.Morbid obesity. BMI 51.99. review appropriate dietary intake, weight loss 15.Constipation. Laxative assistance   LOS: 1 days A FACE TO FACE EVALUATION WAS PERFORMED  Meredith Staggers 02/24/2018, 10:13 AM

## 2018-02-24 NOTE — Progress Notes (Signed)
Occupational Therapy Session Note  Patient Details  Name: Jay Chen MRN: [id number] Date of Birth: 1965/08/10  Today's Date: 02/24/2018 OT Individual Time: 1300-1415 OT Individual Time Calculation (min): 75 min    Short Term Goals: Week 1:  OT Short Term Goal 1 (Week 1): STGs = LTGs  Skilled Therapeutic Interventions/Progress Updates:    Pt presents sitting EOB, pleasant and agreeable to OT tx session, no c/o pain. Pt ambulates within room, performs sit<>stand, and stand pivot transfers throughout session using RW at overall supervision level. Transported pt via w/c to therapy gym. Pt engaged in dynamic standing activity of horseshoes reaching outside BOS to L and R to reach for and toss horseshoes, completing both with single UE support and without UE support during task with overall CGA-supervision. Transitioned to dayroom with pt self-propelling w/c approx 50% distance for UB strengthening/endurance. Therapist assisting with remaining distance due to fatigue and needing rest break. Pt engaged in kinetron seated in w/c for LB strengthening at 20 cm/sec. Performing x3 rounds for approx 2 min each round prior to needing seated rest break. Pt completed additional seated LB exercise using 3lb ankle wts, completing knee extension and seated marching x10 reps each LE, x2 sets, taking rest breaks throughout. Returned to room where pt engaged in standing grooming task brushing teeth prior to ambulating to recliner from w/c at sink. O2 sats monitored throughout session and remaining above 90% on RA. Pt left seated in recliner end of session where pt was left with seatbelt alarm set, call bell and needs within reach.   Therapy Documentation Precautions:  Precautions Precautions: Fall Restrictions Weight Bearing Restrictions: No  Pain: Pain Assessment Pain Scale: 0-10 Pain Score: 0-No pain     Therapy/Group: Individual Therapy  Raymondo Band 02/24/2018, 4:17 PM

## 2018-02-24 NOTE — Progress Notes (Signed)
Social Work Assessment and Plan  Patient Details  Name: Jay Chen MRN: [id number] Date of Birth: Jul 14, 1965  Today's Date: 02/24/2018  Problem List:  Patient Active Problem List   Diagnosis Date Noted  . Debility 02/23/2018  . CHF (congestive heart failure), NYHA class III ([place]) 02/15/2018  . CKD (chronic kidney disease) stage 4, GFR 15-29 ml/min (HCC)   . Left temporal lobe infarction ([place])   . Morbid obesity ([place])   . Poorly controlled type 2 diabetes mellitus with peripheral neuropathy ([place])   . Atrial fibrillation, chronic   . Cerebral embolism with cerebral infarction 01/01/2018  . Lumbar radiculopathy   . Cervical spinal cord compression ([place])   . DCM (dilated cardiomyopathy) ([place])   . Atrial fibrillation with rapid ventricular response ([place])   . Chronic combined systolic (congestive) and diastolic (congestive) heart failure ([place])   . Acute on chronic systolic heart failure ([place]) 11/22/2017  . Bilateral edema of lower extremity 10/21/2017  . Pure hyperglyceridemia 09/01/2017  . Phimosis/redundant prepuce 06/03/2015  . Gout of big toe 09/11/2012  . Hyperlipidemia with target LDL less than 100 09/11/2012  . Routine general medical examination at a health care facility 01/19/2012  . Cor pulmonale ([place]) 01/08/2011  . HTN (hypertension) 01/08/2011  . Obesities, morbid ([place]) 01/08/2011  . Aneurysm of thoracic aorta ([place]) 01/08/2011  . Obstructive sleep apnea 08/27/2007  . RHINITIS 08/18/2007   Past Medical History:  Past Medical History:  Diagnosis Date  . CKD (chronic kidney disease), stage III ([place])   . Congestive heart failure [facility]) May of 2011   Felt to have cor pulmonale; EF 45 to 50% from echo in May 2011  . Cor pulmonale (chronic) ([place])   . Gout    "take daily RX" (02/15/2018)  . History of kidney stones   . Hyperlipidemia   . Hypertension   . Morbid obesity ([place])   . Persistent atrial fibrillation    [name] 12/28/2017  . Sleep apnea    "dx'd; couldn't  tolerate CPAP" (02/15/2018)  . Thoracic aneurysm    a. 4.8cm thoracic aortic aneurysm by CT 2013.  [name] Thoracic aortic aneurysm ([place])    known/notes 12/28/2017  . Type II diabetes mellitus ([place])    Past Surgical History:  Past Surgical History:  Procedure Laterality Date  . CARDIOVERSION N/A 02/22/2018   Procedure: CARDIOVERSION;  Surgeon: Jolaine Artist, MD;  Location: [facility] ENDOSCOPY;  Service: Cardiovascular;  Laterality: N/A;  . LAPAROSCOPIC CHOLECYSTECTOMY  2000  . RIGHT/LEFT HEART CATH AND CORONARY ANGIOGRAPHY N/A 02/20/2018   Procedure: RIGHT/LEFT HEART CATH AND CORONARY ANGIOGRAPHY;  Surgeon: Jolaine Artist, MD;  Location: [facility] CV LAB;  Service: Cardiovascular;  Laterality: N/A;  . US ECHOCARDIOGRAPHY  09/21/2009   EF 45-50%; Cavity size was severely dilated, severe concentric hypertrophy and normal wall motion   Social History:  reports that he has never smoked. He has never used smokeless tobacco. He reports that he drank alcohol. He reports that he does not use drugs.  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   Abuse/Neglect Abuse/Neglect Assessment Can Be Completed: Yes Physical Abuse: Denies Verbal Abuse: Denies Sexual Abuse: Denies Exploitation of  patient/patient's resources: Denies Self-Neglect: Denies  Emotional Status Pt's affect, behavior and adjustment status: Pt is motivated to work on [employer] and knows the unit from recent admission in August 2019.  He does not report any feelings of depression or anxiety. Recent Psychosocial Issues: Pt was not working prior to admission.  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 Psychiatric History: none reported Substance Abuse History: none reported  Patient / Family Perceptions, Expectations & Goals Pt/Family understanding of illness & functional limitations: Pt has a good understanding of his condition and limitations.  He wife was not present at visit, but CSW knows her from previous admission. Premorbid pt/family roles/activities: Pt was independent and driving prior to pt's admission in August 2019. Anticipated changes in roles/activities/participation: Pt would like to resume activities as he is able.  He reports doing well at home and had almost finished with all home therapies. Pt/family expectations/goals: Pt wants to regain his strength and be able to care for himself.  Community [employer] Agencies: None Premorbid Home Care/DME Agencies: Other (Comment)(Pt has a wide rollator, bariatric 3-in-1, and bariatric tub bench.) Transportation available at discharge: family Resource referrals recommended: Neuropsychology  Discharge Planning Living Arrangements: Spouse/significant other Support Systems: Spouse/significant other, Children, Parent, Other relatives, Friends/neighbors Type of Residence: Private residence Insurance Resources: Multimedia programmer (specify)(State [employer]) Museum/gallery curator Resources: Family Support Financial Screen Referred: No Money Management: Patient, Spouse Does the patient have any problems obtaining your medications?: No Home Management: Pt and wife shared household responsibilities. Patient/Family Preliminary Plans: Pt to return to his home  where he can stay on the main level with a bedroom and bathroom. Social Work Anticipated Follow Up Needs: HH/OP Expected length of stay: 7 to 9 days  Clinical Impression CSW met with pt to welcome him back to CIR, as he was just here in August 2019.  He reports doing well at home until the fluid built up on him and he had difficulty breathing.  Pt is feeling better now and he is glad to be on CIR and hopes to not need to stay for very long.  Pt is familiar with the program and he will work hard to get home.  Pt was still receiving some home therapies from [facility], but they were finishing up.  He stated it worked out well and would like to resume their services at d/c.  Pt has DME from last admission.  CSW will continue to follow and assist as needed.  Jairo Bellew, Silvestre Mesi 02/24/2018, 2:39 PM

## 2018-02-24 NOTE — Plan of Care (Signed)
  Problem: Spiritual Needs Goal: Ability to function at adequate level Outcome: Progressing   Problem: Consults Goal: RH GENERAL PATIENT EDUCATION Description See Patient Education module for education specifics. Outcome: Progressing Goal: Skin Care Protocol Initiated - if Braden Score 18 or less Description If consults are not indicated, leave blank or document N/A Outcome: Progressing Goal: Diabetes Guidelines if Diabetic/Glucose > 140 Description If diabetic or lab glucose is > 140 mg/dl - Initiate Diabetes/Hyperglycemia Guidelines & Document Interventions  Outcome: Progressing   Problem: RH BOWEL ELIMINATION Goal: RH STG MANAGE BOWEL WITH ASSISTANCE Description STG Manage Bowel with min Assistance.  Outcome: Progressing Goal: RH STG MANAGE BOWEL W/MEDICATION W/ASSISTANCE Description STG Manage Bowel with Medication with min Assistance.  Outcome: Progressing   Problem: RH SKIN INTEGRITY Goal: RH STG SKIN FREE OF INFECTION/BREAKDOWN Description Patients skin will remain free from further infection or breakdown with min assist.  Outcome: Progressing Goal: RH STG MAINTAIN SKIN INTEGRITY WITH ASSISTANCE Description STG Maintain Skin Integrity With min Assistance.  Outcome: Progressing   Problem: RH SAFETY Goal: RH STG ADHERE TO SAFETY PRECAUTIONS W/ASSISTANCE/DEVICE Description STG Adhere to Safety Precautions With mod I  Assistance/Device.  Outcome: Progressing   Problem: RH PAIN MANAGEMENT Goal: RH STG PAIN MANAGED AT OR BELOW PT'S PAIN GOAL Description < 4  Outcome: Progressing

## 2018-02-24 NOTE — Progress Notes (Signed)
Inpatient Rehabilitation Center Individual Statement of Services  Patient Name:  Jay Chen  Date:  02/24/2018  Welcome to the [facility].  Our goal is to provide you with an individualized program based on your diagnosis and situation, designed to meet your specific needs.  With this comprehensive rehabilitation program, you will be expected to participate in at least 3 hours of rehabilitation therapies Monday-Friday, with modified therapy programming on the weekends.  Your rehabilitation program will include the following services:  Physical Therapy (PT), Occupational Therapy (OT), 24 hour per day rehabilitation nursing, Case Management (Social Worker), Rehabilitation Medicine, Nutrition Services and Pharmacy Services  Weekly team conferences will be held on Tuesdays to discuss your progress.  Your Social Worker will talk with you frequently to get your input and to update you on team discussions.  Team conferences with you and your family in attendance may also be held.  Expected length of stay:  7 to 9 days  Overall anticipated outcome:  Independent with assistive device  Depending on your progress and recovery, your program may change. Your Social Worker will coordinate services and will keep you informed of any changes. Your Social Worker's name and contact numbers are listed  below.  The following services may also be recommended but are not provided by the [facility] will be made to provide these services after discharge if needed.  Arrangements include referral to agencies that provide these services.  Your insurance has been verified to be:  State [employer] Your primary doctor is:  Dr. Scarlette Calico  Pertinent information will be shared with your doctor and your insurance  company.  Social Worker:  Alfonse Alpers, LCSW  [phone] or [phone]  Information discussed with and copy given to patient by: Trey Sailors, 02/24/2018, 3:07 PM

## 2018-02-24 NOTE — Evaluation (Signed)
Physical Therapy Assessment and Plan  Patient Details  Name: Jay Chen MRN: [id number] Date of Birth: September 16, 1965  PT Diagnosis: Abnormality of gait, Coordination disorder, Difficulty walking and Muscle weakness Rehab Potential: Excellent ELOS: 7-9 days    Today's Date: 02/24/2018 PT Individual Time: 0930-1025 PT Individual Time Calculation (min): 55 min    Problem List:  Patient Active Problem List   Diagnosis Date Noted  . Debility 02/23/2018  . CHF (congestive heart failure), NYHA class III ([place]) 02/15/2018  . CKD (chronic kidney disease) stage 4, GFR 15-29 ml/min (HCC)   . Left temporal lobe infarction ([place])   . Morbid obesity ([place])   . Poorly controlled type 2 diabetes mellitus with peripheral neuropathy ([place])   . Atrial fibrillation, chronic   . Cerebral embolism with cerebral infarction 01/01/2018  . Lumbar radiculopathy   . Cervical spinal cord compression ([place])   . DCM (dilated cardiomyopathy) ([place])   . Atrial fibrillation with rapid ventricular response ([place])   . Chronic combined systolic (congestive) and diastolic (congestive) heart failure ([place])   . Acute on chronic systolic heart failure ([place]) 11/22/2017  . Bilateral edema of lower extremity 10/21/2017  . Pure hyperglyceridemia 09/01/2017  . Phimosis/redundant prepuce 06/03/2015  . Gout of big toe 09/11/2012  . Hyperlipidemia with target LDL less than 100 09/11/2012  . Routine general medical examination at a health care facility 01/19/2012  . Cor pulmonale ([place]) 01/08/2011  . HTN (hypertension) 01/08/2011  . Obesities, morbid ([place]) 01/08/2011  . Aneurysm of thoracic aorta ([place]) 01/08/2011  . Obstructive sleep apnea 08/27/2007  . RHINITIS 08/18/2007    Past Medical History:  Past Medical History:  Diagnosis Date  . CKD (chronic kidney disease), stage III ([place])   . Congestive heart failure [facility]) May of 2011   Felt to have cor pulmonale; EF 45 to 50% from echo in May 2011  . Cor pulmonale (chronic)  ([place])   . Gout    "take daily RX" (02/15/2018)  . History of kidney stones   . Hyperlipidemia   . Hypertension   . Morbid obesity ([place])   . Persistent atrial fibrillation    [name] 12/28/2017  . Sleep apnea    "dx'd; couldn't tolerate CPAP" (02/15/2018)  . Thoracic aneurysm    a. 4.8cm thoracic aortic aneurysm by CT 2013.  [name] Thoracic aortic aneurysm ([place])    known/notes 12/28/2017  . Type II diabetes mellitus ([place])    Past Surgical History:  Past Surgical History:  Procedure Laterality Date  . CARDIOVERSION N/A 02/22/2018   Procedure: CARDIOVERSION;  Surgeon: Jolaine Artist, MD;  Location: [facility] ENDOSCOPY;  Service: Cardiovascular;  Laterality: N/A;  . LAPAROSCOPIC CHOLECYSTECTOMY  2000  . RIGHT/LEFT HEART CATH AND CORONARY ANGIOGRAPHY N/A 02/20/2018   Procedure: RIGHT/LEFT HEART CATH AND CORONARY ANGIOGRAPHY;  Surgeon: Jolaine Artist, MD;  Location: [place] CV LAB;  Service: Cardiovascular;  Laterality: N/A;  . US ECHOCARDIOGRAPHY  09/21/2009   EF 45-50%; Cavity size was severely dilated, severe concentric hypertrophy and normal wall motion    Assessment & Plan Clinical Impression:  Jay Schoneman Burnetteis a 52 year old right-handed male with history of diastolic/systolic congestive heart failure, CKD stage III, history of thoracic aneurysm, diabetes mellitus, hypertension, hyperlipidemia, morbid obesity/OSA. Presented 12/28/2017 with right side flank pain and bilateral lower extremity weakness. Per chart review patient lives with spouse and children ages 4 and 30. Reported to be independent driving prior to admission and currently unemployed. Two-level home with bedroom on Main level and  3 steps to entry. Wife works during the day. Mother can check on him as needed. Chest x-ray showed mild vascular congestion and borderline cardiomegaly as well as suspected opacity at the left lung base could not exclude pneumonia and was placed on Rocephin as well as doxycycline. CT renal stone  study no acute abnormality seen. There was a 2 mm nonobstructing stone at the lower pole of the right kidney. Ultrasound aorta without abdominal aortic aneurysm noted. MRI of the brain showed small acute posterior temporal lobe infarction. MRI lumbar thoracic cervical spine showed L4-5 broad-based disc bulge. Moderate bilateral facet arthropathy with facet effusions. L5-S1 with moderate right facet arthropathy. MRI cervical spine with C4-5 severe cord compression due to disc protrusion with cord edema. Echocardiogram with ejection fraction 25%. Systolic function severely reduced. Carotid Dopplers with no ICA stenosis. Close monitoring of renal function 2.31-2.72. Cardiology services consulted in regards to CHF as well as persistent atrial fibrillation remained on Coreg as well as Lasix and Lopressor. Xarelto was added for atrial fibrillation with cardiac rate controlled.There was question of left lower lobe pneumonia per chest x-ray with follow-up chest x-ray completed after broad-spectrum antibiotics currently completing course of Omnicef and doxycycline. Neurology consulted and follow-upinitiallymaintained on prednisone Dosepak for severe spinal cord compression with lumbar radiculopathy and no plan for surgical intervention at this time per Dr. Elizebeth Brooking plan to address as outpatient. Physical and occupational therapy evaluations completed with recommendations of physical medicine rehab consult. Patient was admitted for a comprehensive rehab program. Patient transferred to CIR on 02/23/2018 .   Patient currently requires supervision with mobility secondary to muscle weakness, decreased cardiorespiratoy endurance and decreased coordination.  Prior to hospitalization, patient was modified independent  with mobility and lived with Spouse, Family in a House home.  Home access is 3Stairs to enter.  Patient will benefit from skilled PT intervention to maximize safe functional mobility and  minimize fall risk for planned discharge home with intermittent assist.  Anticipate patient will benefit from follow up [facility] at discharge.  PT - End of Session Activity Tolerance: Tolerates 30+ min activity with multiple rests Endurance Deficit: Yes Endurance Deficit Description: generalized weakness PT Assessment Rehab Potential (ACUTE/IP ONLY): Excellent PT Patient demonstrates impairments in the following area(s): Balance;Endurance;Motor;Safety PT Transfers Functional Problem(s): Bed Mobility;Bed to Chair;Car;Furniture PT Locomotion Functional Problem(s): Ambulation;Stairs PT Plan PT Intensity: Minimum of 1-2 x/day ,45 to 90 minutes PT Frequency: 5 out of 7 days PT Duration Estimated Length of Stay: 7-9 days  PT Treatment/Interventions: Ambulation/gait training;Pain management;Stair training;Balance/vestibular training;DME/adaptive equipment instruction;Patient/family education;Therapeutic Activities;Wheelchair propulsion/positioning;Functional electrical stimulation;Therapeutic Exercise;Psychosocial support;Community reintegration;Functional mobility training;UE/LE Strength taining/ROM;Discharge planning;Neuromuscular re-education;UE/LE Coordination activities PT Transfers Anticipated Outcome(s): Mod(I) with LRAD  PT Locomotion Anticipated Outcome(s): Mod(I) with LRAD  PT Recommendation Follow Up Recommendations: Home health PT Patient destination: Home Equipment Recommended: None recommended by PT  Skilled Therapeutic Intervention Patient received in bed, pleasant and willing to participate in skilled PT services. Able to complete all bed mobility with independence and use of bed rails, able to don underwear and pants at EOB without balance loss and performed all functional transfers and gait this session with RW and S, able to pull pants up in standing with U UE support and no unsteadiness noted. Able to complete functional mobility activities including gait 26f with RW, car transfers,  ramp and mulch navigation, and stairs with S but did require min guard for curb navigation with RW. VSS throughout session, no SOB or dyspnea noted. He does demonstrate gross functional weakness  as evidenced by increased time to complete functional activities, impaired gait with step to pattern, and reduced functional activity tolerance required multiple rest breaks throughout the session. He was left sitting at EOB with bed alarm activated, respiratory therapist present and attending.   PT Evaluation Precautions/Restrictions Precautions Precautions: Fall Restrictions Weight Bearing Restrictions: No General Chart Reviewed: Yes Additional Pertinent History: CIR stay for CVA in August 2019  Response to Previous Treatment: Not applicable Family/Caregiver Present: No Vital SignsTherapy Vitals Pulse Rate: 71 BP: 108/72 Patient Position (if appropriate): Lying Oxygen Therapy SpO2: 91 % O2 Device: Room Air Pain Pain Assessment Pain Scale: 0-10 Pain Score: 0-No pain Home Living/Prior Functioning Home Living Available Help at Discharge: Family;Available PRN/intermittently Type of Home: House Home Access: Stairs to enter [employer] of Steps: 3 Entrance Stairs-Rails: Can reach both Home Layout: Two level;Bed/bath upstairs;Able to live on main level with bedroom/bathroom Alternate Level Stairs-Number of Steps: 20 Alternate Level Stairs-Rails: Right Bathroom Shower/Tub: Tub/shower unit;Curtain Bathroom Toilet: Standard Bathroom Accessibility: Yes Additional Comments: 2 kids ages 12 and 15 and wife goes to school during day, has a tub bench and a BSC over the toilet  Lives With: Spouse;Family Prior Function Level of Independence: Independent with gait;Independent with transfers;Independent with basic ADLs;Independent with homemaking with ambulation  Able to Take Stairs?: Yes Driving: Yes Vocation: Retired Leisure: Hobbies-yes (Comment) Comments: walks with rollator, takes  care of the house and enjoys driving kids to activities  Vision/Perception  Perception Perception: Within Functional Limits Praxis Praxis: Intact  Cognition Overall Cognitive Status: Within Functional Limits for tasks assessed Arousal/Alertness: Awake/alert Orientation Level: Oriented X4 Attention: Alternating Selective Attention: Appears intact Alternating Attention: Appears intact Memory: Appears intact Awareness: Appears intact Problem Solving: Appears intact Safety/Judgment: Appears intact Sensation Sensation Light Touch: Appears Intact Hot/Cold: Appears Intact Proprioception: Appears Intact Stereognosis: Appears Intact Coordination Gross Motor Movements are Fluid and Coordinated: Yes Fine Motor Movements are Fluid and Coordinated: Yes Coordination and Movement Description: gross motor affected by weight, however able to meet motor demands for self care tasks without physical assist Motor  Motor Motor: Within Functional Limits Motor - Skilled Clinical Observations: generalized weakness limiting functional mobility   Mobility Bed Mobility Bed Mobility: Rolling Right;Rolling Left;Sit to Supine;Supine to Sit Rolling Right: Independent Rolling Left: Independent Supine to Sit: Independent with assistive device Sit to Supine: Independent with assistive device Transfers Transfers: Sit to Stand;Stand Pivot Transfers Sit to Stand: Supervision/Verbal cueing Stand to Sit: Supervision/Verbal cueing Stand Pivot Transfers: Supervision/Verbal cueing Stand Pivot Transfer Details: Verbal cues for safe use of DME/AE;Verbal cues for precautions/safety Transfer (Assistive device): Rolling walker Locomotion  Gait Ambulation: Yes Gait Assistance: Supervision/Verbal cueing Gait Distance (Feet): 200 Feet Assistive device: Rolling walker Gait Assistance Details: Verbal cues for safe use of DME/AE;Verbal cues for precautions/safety Gait Gait: Yes Gait Pattern: Impaired Gait Pattern:  Step-to pattern;Decreased step length - right;Decreased step length - left;Decreased stride length;Decreased stance time - left;Decreased stance time - right;Decreased trunk rotation;Trunk flexed;Wide base of support Gait velocity: decreased  Stairs / Additional Locomotion Stairs: Yes Stairs Assistance: Supervision/Verbal cueing Stair Management Technique: Two rails Number of Stairs: 4 Ramp: Contact Guard/touching assist Curb: Contact Guard/Touching assist Wheelchair Mobility Wheelchair Mobility: No  Trunk/Postural Assessment  Cervical Assessment Cervical Assessment: Within Functional Limits Thoracic Assessment Thoracic Assessment: Within Functional Limits Lumbar Assessment Lumbar Assessment: Within Functional Limits Postural Control Postural Control: Within Functional Limits  Balance Balance Balance Assessed: Yes Static Sitting Balance Static Sitting - Balance Support: Feet supported;No upper extremity supported Static Sitting -  Level of Assistance: 7: Independent Dynamic Sitting Balance Dynamic Sitting - Balance Support: Feet supported;No upper extremity supported Dynamic Sitting - Level of Assistance: 7: Independent Dynamic Sitting - Balance Activities: Lateral lean/weight shifting;Forward lean/weight shifting;Reaching for objects Static Standing Balance Static Standing - Balance Support: Bilateral upper extremity supported;During functional activity Static Standing - Level of Assistance: 5: Stand by assistance Dynamic Standing Balance Dynamic Standing - Balance Support: Bilateral upper extremity supported;During functional activity Dynamic Standing - Level of Assistance: 5: Stand by assistance Extremity Assessment  RUE Assessment RUE Assessment: Not tested LUE Assessment LUE Assessment: Not tested RLE Assessment RLE Assessment: Exceptions to [facility] General Strength Comments: ankle dorsiflexion and quads 5/5, hip ABD sitting 4/5, hip flexion 3/5  LLE Assessment LLE  Assessment: Exceptions to [facility] General Strength Comments: anlke dorsiflexion and quads 5/5, hip abduction sitting 4/5, hip flexion 3/5     Refer to Care Plan for Long Term Goals  Recommendations for other services: Surveyor, mining group, Stress management and Outing/community reintegration  Discharge Criteria: Patient will be discharged from PT if patient refuses treatment 3 consecutive times without medical reason, if treatment goals not met, if there is a change in medical status, if patient makes no progress towards goals or if patient is discharged from hospital.  The above assessment, treatment plan, treatment alternatives and goals were discussed and mutually agreed upon: by patient  Deniece Ree PT, DPT, CBIS  Supplemental Physical Therapist [facility]    Pager [phone] Acute Rehab Office [phone]

## 2018-02-24 NOTE — Evaluation (Signed)
Occupational Therapy Assessment and Plan  Patient Details  Name: Jay Chen MRN: [id number] Date of Birth: 1965-08-24  OT Diagnosis: muscle weakness (generalized) Rehab Potential: Rehab Potential (ACUTE ONLY): Excellent ELOS: 7-9 days   Today's Date: 02/24/2018 OT Individual Time:  619-509 and   1115-1200  OT Individual Time Calculation (min): 15 min and 45 min     Problem List:  Patient Active Problem List   Diagnosis Date Noted  . Debility 02/23/2018  . CHF (congestive heart failure), NYHA class III ([place]) 02/15/2018  . CKD (chronic kidney disease) stage 4, GFR 15-29 ml/min (HCC)   . Left temporal lobe infarction ([place])   . Morbid obesity ([place])   . Poorly controlled type 2 diabetes mellitus with peripheral neuropathy ([place])   . Atrial fibrillation, chronic   . Cerebral embolism with cerebral infarction 01/01/2018  . Lumbar radiculopathy   . Cervical spinal cord compression ([place])   . DCM (dilated cardiomyopathy) ([place])   . Atrial fibrillation with rapid ventricular response ([place])   . Chronic combined systolic (congestive) and diastolic (congestive) heart failure ([place])   . Acute on chronic systolic heart failure ([place]) 11/22/2017  . Bilateral edema of lower extremity 10/21/2017  . Pure hyperglyceridemia 09/01/2017  . Phimosis/redundant prepuce 06/03/2015  . Gout of big toe 09/11/2012  . Hyperlipidemia with target LDL less than 100 09/11/2012  . Routine general medical examination at a health care facility 01/19/2012  . Cor pulmonale ([place]) 01/08/2011  . HTN (hypertension) 01/08/2011  . Obesities, morbid ([place]) 01/08/2011  . Aneurysm of thoracic aorta ([place]) 01/08/2011  . Obstructive sleep apnea 08/27/2007  . RHINITIS 08/18/2007    Past Medical History:  Past Medical History:  Diagnosis Date  . CKD (chronic kidney disease), stage III ([place])   . Congestive heart failure [facility]) May of 2011   Felt to have cor pulmonale; EF 45 to 50% from echo in May 2011  . Cor pulmonale  (chronic) ([place])   . Gout    "take daily RX" (02/15/2018)  . History of kidney stones   . Hyperlipidemia   . Hypertension   . Morbid obesity ([place])   . Persistent atrial fibrillation    [name] 12/28/2017  . Sleep apnea    "dx'd; couldn't tolerate CPAP" (02/15/2018)  . Thoracic aneurysm    a. 4.8cm thoracic aortic aneurysm by CT 2013.  [name] Thoracic aortic aneurysm ([place])    known/notes 12/28/2017  . Type II diabetes mellitus ([place])    Past Surgical History:  Past Surgical History:  Procedure Laterality Date  . CARDIOVERSION N/A 02/22/2018   Procedure: CARDIOVERSION;  Surgeon: Jolaine Artist, MD;  Location: [facility] ENDOSCOPY;  Service: Cardiovascular;  Laterality: N/A;  . LAPAROSCOPIC CHOLECYSTECTOMY  2000  . RIGHT/LEFT HEART CATH AND CORONARY ANGIOGRAPHY N/A 02/20/2018   Procedure: RIGHT/LEFT HEART CATH AND CORONARY ANGIOGRAPHY;  Surgeon: Jolaine Artist, MD;  Location: [facility] CV LAB;  Service: Cardiovascular;  Laterality: N/A;  . US ECHOCARDIOGRAPHY  09/21/2009   EF 45-50%; Cavity size was severely dilated, severe concentric hypertrophy and normal wall motion    Assessment & Plan Clinical Impression: .[name] is a 52 year old right-handed male with history of diastolic/systolic congestive heart failure, CKD stage III, history of thoracic aneurysm, diabetes mellitus, hypertension, hyperlipidemia, morbid obesity/OSA.  Presented 12/28/2017 with right side flank pain and bilateral lower extremity weakness.  Per chart review patient lives with spouse and children ages 66 and 49.  Reported to be independent driving prior to admission and currently  unemployed.  Two-level home with bedroom on Main level and 3 steps to entry.  Wife works during the day.  Mother can check on him as needed.  Chest x-ray showed mild vascular congestion and borderline cardiomegaly as well as suspected opacity at the left lung base could not exclude pneumonia and was placed on Rocephin as well as doxycycline.  CT  renal stone study no acute abnormality seen.  There was a 2 mm nonobstructing stone at the lower pole of the right kidney.  Ultrasound aorta without abdominal aortic aneurysm noted.  MRI of the brain showed small acute posterior temporal lobe infarction.  MRI lumbar thoracic cervical spine showed L4-5 broad-based disc bulge.  Moderate bilateral facet arthropathy with facet effusions.  L5-S1 with moderate right facet arthropathy.  MRI cervical spine with C4-5 severe cord compression due to disc protrusion with cord edema.  Echocardiogram with ejection fraction 25%.  Systolic function severely reduced.  Carotid Dopplers with no ICA stenosis.  Close monitoring of renal function 2.31-2.72.  Cardiology services consulted in regards to CHF as well as persistent atrial fibrillation remained on Coreg as well as Lasix and Lopressor.  Xarelto was added for atrial fibrillation with cardiac rate controlled.  There was question of left lower lobe pneumonia per chest x-ray with follow-up chest x-ray completed after broad-spectrum antibiotics currently completing course of Omnicef and doxycycline.  Neurology consulted and follow-up initially maintained on prednisone Dosepak for severe spinal cord compression with lumbar radiculopathy and no plan for surgical intervention at this time per Dr. Kathyrn Sheriff and plan to address as outpatient.  Physical and occupational therapy evaluations completed with recommendations of physical medicine rehab consult.  Patient was admitted for a comprehensive rehab program.    Patient transferred to CIR on 02/23/2018 .    Patient currently requires supervision with basic self-care skills secondary to muscle weakness, decreased cardiorespiratoy endurance and decreased standing balance.  Prior to hospitalization, patient could complete all self care with modified independent .  Patient will benefit from skilled intervention to increase independence with basic self-care skills prior to discharge home  with care partner.  Anticipate patient will require intermittent supervision and follow up home health.  OT - End of Session Activity Tolerance: Tolerates 30+ min activity with multiple rests Endurance Deficit Description: generalized weakness OT Assessment Rehab Potential (ACUTE ONLY): Excellent OT Patient demonstrates impairments in the following area(s): Balance;Endurance;Motor OT Basic ADL's Functional Problem(s): Bathing;Dressing;Toileting OT Advanced ADL's Functional Problem(s): Simple Meal Preparation OT Transfers Functional Problem(s): Toilet;Tub/Shower OT Additional Impairment(s): None OT Plan OT Intensity: Minimum of 1-2 x/day, 45 to 90 minutes OT Frequency: 5 out of 7 days OT Duration/Estimated Length of Stay: 7-9 days OT Treatment/Interventions: Balance/vestibular training;Discharge planning;DME/adaptive equipment instruction;Functional mobility training;Patient/family education;Self Care/advanced ADL retraining;Therapeutic Activities;Therapeutic Exercise;UE/LE Strength taining/ROM OT Self Feeding Anticipated Outcome(s): no goal, pt is independent OT Basic Self-Care Anticipated Outcome(s): mod I OT Toileting Anticipated Outcome(s): mod I OT Bathroom Transfers Anticipated Outcome(s): mod I OT Recommendation Patient destination: Home Follow Up Recommendations: Home health OT Equipment Recommended: None recommended by OT   Skilled Therapeutic Intervention Visit 1:  No c/o pain. Pt seen for initial evaluation and interview while pt was in bed.  Pt seen for 15 minutes to discuss his PLOF at home since his last stroke rehab in August and his CLOF.  Pt discussed his goals.  Reviewed his home set up.  Pt will be seen later this morning to continue the evaluation and complete ADL training.   Visit 2:  No c/o pain.  Pt sitting at EOB ready to begin ADL training.  Pt stood from EOB and used his RW to ambulate to toilet, completed toilet transfer and toileting, then transferred to  shower sitting on tub bench and completed shower using long sponge for feet and sit to stand for his bottom.  He then ambulated with RW back to EOB.  Pt completed all activities with close S, except for doffing and donning of socks.  Pt took a few short rest breaks, but otherwise tolerated therapy well.   Pt stated he was hoping to be well enough to go home in a week or less. Discussed LOS and OT POC.  Pt sitting on EOB eating lunch, bed alarm set and all needs met.     OT Evaluation Precautions/Restrictions  Precautions Precautions: Fall Restrictions Weight Bearing Restrictions: No    Vital Signs Therapy Vitals Pulse Rate: 71 BP: 108/72 Patient Position (if appropriate): Lying Oxygen Therapy SpO2: 91 % O2 Device: Room Air Pain Pain Assessment Pain Scale: 0-10 Pain Score: 0-No pain Home Living/Prior Functioning Home Living Available Help at Discharge: Family, Available PRN/intermittently Type of Home: House Home Access: Stairs to enter Technical brewer of Steps: 3 Entrance Stairs-Rails: Can reach both Home Layout: Two level, Bed/bath upstairs, Able to live on main level with bedroom/bathroom Alternate Level Stairs-Number of Steps: 20 Alternate Level Stairs-Rails: Right Bathroom Shower/Tub: Tub/shower unit, Architectural technologist: Standard Additional Comments: 2 kids ages 55 and 67 and wife goes to school during day, has a tub bench and a BSC over the toilet  Lives With: Spouse, Family IADL History Mode of Transportation: Musician Occupation: Retired Prior Function Level of Independence: Independent with gait, Independent with transfers, Independent with basic ADLs, Independent with homemaking with ambulation  Able to Take Stairs?: Yes Driving: Yes Vocation: Retired Comments: walks with rollator, takes care of the house and enjoys driving kids to activities  ADL ADL Eating: Independent Grooming: Independent Upper Body Bathing: Independent Where Assessed-Upper  Body Bathing: Shower Lower Body Bathing: Supervision/safety Where Assessed-Lower Body Bathing: Shower Upper Body Dressing: Setup Lower Body Dressing: Setup(min A for socks) Toileting: Supervision/safety Toilet Transfer: Close supervision Toilet Transfer Method: Magazine features editor: Close supervision Social research officer, government Method: Heritage manager: Civil engineer, contracting with back Vision Baseline Vision/History: Wears glasses Wears Glasses: Reading only Patient Visual Report: No change from baseline Vision Assessment?: No apparent visual deficits Perception  Perception: Within Functional Limits Praxis Praxis: Intact Cognition Overall Cognitive Status: Within Functional Limits for tasks assessed Arousal/Alertness: Awake/alert Orientation Level: Person;Place;Situation Person: Oriented Place: Oriented Situation: Oriented Year: 2019 Month: August Day of Week: Correct Memory: Appears intact Immediate Memory Recall: Sock;Blue;Bed Memory Recall: Sock;Blue;Bed Memory Recall Sock: Without Cue Memory Recall Blue: Without Cue Memory Recall Bed: Without Cue Awareness: Appears intact Problem Solving: Appears intact Safety/Judgment: Appears intact Sensation Sensation Light Touch: Appears Intact Hot/Cold: Appears Intact Proprioception: Appears Intact Stereognosis: Appears Intact Motor  Motor Motor - Skilled Clinical Observations: generalized weakness limiting functional mobility  Mobility    S with RW for mobility in the room Trunk/Postural Assessment  Cervical Assessment Cervical Assessment: Within Functional Limits Thoracic Assessment Thoracic Assessment: Within Functional Limits Lumbar Assessment Lumbar Assessment: Within Functional Limits Postural Control Postural Control: Within Functional Limits  Balance Static Sitting Balance Static Sitting - Level of Assistance: 7: Independent Dynamic Sitting Balance Dynamic Sitting - Level of Assistance:  7: Independent Static Standing Balance Static Standing - Level of Assistance: 5: Stand by assistance Dynamic Standing Balance  Dynamic Standing - Level of Assistance: 5: Stand by assistance Extremity/Trunk Assessment RUE Assessment RUE Assessment: Within Functional Limits LUE Assessment LUE Assessment: Within Functional Limits     Refer to Care Plan for Long Term Goals  Recommendations for other services: None    Discharge Criteria: Patient will be discharged from OT if patient refuses treatment 3 consecutive times without medical reason, if treatment goals not met, if there is a change in medical status, if patient makes no progress towards goals or if patient is discharged from hospital.  The above assessment, treatment plan, treatment alternatives and goals were discussed and mutually agreed upon: by patient  [place] 02/24/2018, 12:16 PM

## 2018-02-24 NOTE — Telephone Encounter (Signed)
Pt was on TCM report admitted 02/15/18 for acute on chronic combined CHF and marked volume overload.He underwent RHC/LHC with normal cors and moderate mixed pulmonary venous/arterial HTN.Once diuresed he underwent successful DC-CV. He will continue amiodarone 200 mg twice a day + Toprol XL 75 mg daily. He will remain on xarelto. Pt discharged 02/23/18 in stable condition to CIR. Will follow-up w/cardiology once he has been D/C from CIR.Marland KitchenJohny Chess

## 2018-02-24 NOTE — Discharge Instructions (Addendum)
Inpatient Rehab Discharge Instructions  Jay Chen Discharge date and time: 03/01/18   Activities/Precautions/ Functional Status: Activity: no lifting, driving, or strenuous exercise for till cleared by MD Diet: cardiac diet Wound Care: none needed    Functional status:  ___ No restrictions     ___ Walk up steps independently ___ 24/7 supervision/assistance   ___ Walk up steps with assistance _X__ Intermittent supervision/assistance  _X_ Bathe/dress independently ___ Walk with walker     ___ Bathe/dress with assistance ___ Walk Independently    ___ Shower independently ___ Walk with assistance    ___ Shower with assistance _X_ No alcohol     ___ Return to work/school ________   Special Instructions:   [place] will resume PT, OT and ST.    My questions have been answered and I understand these instructions. I will adhere to these goals and the provided educational materials after my discharge from the hospital.  Patient/Caregiver Signature _______________________________ Date __________  Clinician Signature _______________________________________ Date __________  Please bring this form and your medication list with you to all your follow-up doctor's appointments.

## 2018-02-24 NOTE — Progress Notes (Addendum)
Advanced Heart Failure Rounding Note  PCP: [name], MD Primary Cardiologist: Dr [name]  Subjective:    Yesterday transferred to CIR.   Feeling better. Denies SOB, orthopnea or PND.   Objective:   Weight Range: (!) 159.8 kg Body mass index is 53.58 kg/m.   Vital Signs:   Temp:  [97.6 F (36.4 C)-98.5 F (36.9 C)] 98.5 F (36.9 C) (10/04 0510) Pulse Rate:  [62-95] 71 (10/04 0832) Resp:  [18-20] 18 (10/04 0510) BP: (108-141)/(68-96) 108/72 (10/04 0832) SpO2:  [90 %-97 %] 91 % (10/04 0832) Weight:  [156.4 kg-159.8 kg] 159.8 kg (10/04 0510) Last BM Date: 02/23/18  Weight change: Filed Weights   02/23/18 1751 02/24/18 0510  Weight: (!) 156.4 kg (!) 159.8 kg    Intake/Output:   Intake/Output Summary (Last 24 hours) at 02/24/2018 0943 Last data filed at 02/24/2018 0800 Gross per 24 hour  Intake 816 ml  Output 450 ml  Net 366 ml      Physical Exam    General:  Sitting up in chair. No resp difficulty HEENT: normal Neck: supple. no JVD. Carotids 2+ bilat; no bruits. No lymphadenopathy or thryomegaly appreciated. Cor: PMI nondisplaced. Regular rate & rhythm. No rubs, gallops or murmurs. Lungs: clear Abdomen: obese soft, nontender, nondistended. No hepatosplenomegaly. No bruits or masses. Good bowel sounds. Extremities: no cyanosis, clubbing, rash, edema Neuro: alert & orientedx3, cranial nerves grossly intact. moves all 4 extremities w/o difficulty. Affect pleasant   Labs    CBC Recent Labs    02/24/18 0624  WBC 6.2  NEUTROABS 4.1  HGB 12.7*  HCT 41.4  MCV 101.0*  PLT 852   Basic Metabolic Panel Recent Labs    02/23/18 0530 02/24/18 0624  NA 141 141  K 3.7 4.0  CL 97* 98  CO2 37* 36*  GLUCOSE 128* 140*  BUN 45* 43*  CREATININE 2.12* 1.93*  CALCIUM 8.7* 8.8*   Liver Function Tests Recent Labs    02/24/18 0624  AST 39  ALT 58*  ALKPHOS 93  BILITOT 1.3*  PROT 6.2*  ALBUMIN 2.5*   No results for input(s): LIPASE, AMYLASE  in the last 72 hours. Cardiac Enzymes No results for input(s): CKTOTAL, CKMB, CKMBINDEX, TROPONINI in the last 72 hours.  BNP: BNP (last 3 results) Recent Labs    12/27/17 2354 02/15/18 1559  BNP 283.3* 847.1*    ProBNP (last 3 results) No results for input(s): PROBNP in the last 8760 hours.   D-Dimer No results for input(s): DDIMER in the last 72 hours. Hemoglobin A1C No results for input(s): HGBA1C in the last 72 hours. Fasting Lipid Panel No results for input(s): CHOL, HDL, LDLCALC, TRIG, CHOLHDL, LDLDIRECT in the last 72 hours. Thyroid Function Tests No results for input(s): TSH, T4TOTAL, T3FREE, THYROIDAB in the last 72 hours.  Invalid input(s): FREET3  Other results:   Imaging    No results found.   Medications:     Scheduled Medications: . allopurinol  100 mg Oral Daily  . amiodarone  200 mg Oral BID  . aspirin EC  81 mg Oral Daily  . atorvastatin  80 mg Oral q1800  . feeding supplement (PRO-STAT SUGAR FREE 64)  30 mL Oral BID  . hydrALAZINE  75 mg Oral Q8H  . insulin aspart  0-15 Units Subcutaneous TID WC  . insulin aspart  0-5 Units Subcutaneous QHS  . isosorbide mononitrate  30 mg Oral Daily  . metoprolol succinate  75 mg Oral  Daily  . omega-3 acid ethyl esters  1 g Oral BID  . potassium chloride  40 mEq Oral Daily  . rivaroxaban  20 mg Oral Q supper    Infusions:   PRN Medications: acetaminophen, alum & mag hydroxide-simeth, bisacodyl, diphenhydrAMINE, guaiFENesin-dextromethorphan, polyethylene glycol, prochlorperazine **OR** prochlorperazine **OR** prochlorperazine, sodium phosphate, traZODone    Patient Profile   Jay Chen is a 52 y.o. male with acute on chronic combined CHF, presumed non-ischemic (no cath), EF 20-25%, AAA 4.7 cm by echo, hx of CVA, HTN, HLD, OSA, DM2, CKD III (Baseline Cr 1.7 - 1.9), and morbid obesity (Body mass index is 56.56 kg/m.)   Admitted from [facility] clinic 02/15/18 with acute on chronic combined CHF and  marked volume overload.    Assessment/Plan   1. Acute on chronic combined CHF, presumed non-ischemic (no cath) - Echo 12/2017 EF 20-25% - R/LHC normal cors. RA 17 PCWP 21 - Volume status trending up slightly. Start 40 mg po torsemide  - BMET in am.  -- Continue Toprol 75  daily - Continue hydralazine 75 mg tid.  - Continue imdur 30 mg daily - No ARB/ARNi/spiro currently with CKD 3  2. AKI on CKD III - Baseline Cr 1.7 - 1.9 - Creatinine improved to day to 1.9  -Stable.     3. Persistent Afib/Aflutter - New diagnosis 08/2017, which was being treated with rate control.   -S/P DC-CV on 10/2.  - Regular pulse. Continue below.   - Continue amio to 200 mg twice a day. Decrease amiodarone to 200 mg on 03/03/2018  - Continue  Toprol 75 mg daily.  - Continue Xarelto 20 mg daily   4. H/o CVA - Only remaining deficit.  - On Xarelto chronically.    5. HTN -Stable.  - Cr borderline for ARB/ARNi/Spiro.    6. AAA  - 4.7 cm by echo 12/2017  - Will need further imaging as creatinine permits.    7. OSA - Non-compliant with CPAP. Discussed importance of CPAP.   8. DM2 - Stable CBGs. Continue SSI - Hgb A1C 7.1 01/24/18   9. Morbid obesity -Body mass index is 53.58 kg/m. - Needs significant weight loss.  - Nutrition consult ordered  10. Severe Deconditioning.  CIR appreciated.   HF team will follow.   Length of Stay: [place], NP  02/24/2018, 9:43 AM  Advanced Heart Failure Team Pager [phone] (M-F; 7a - 4p)  Please contact [facility] for night-coverage after hours (4p -7a ) and weekends on [email or web address]  Doing well. Maintaining NSR on po amio. Continue Xarelto. Volume status ok. Creatinine improving. Start toresemide 40 daily.   We will see again on Monday. Please call over the weekend with questions.   Glori Bickers, MD  12:15 PM

## 2018-02-25 ENCOUNTER — Inpatient Hospital Stay (HOSPITAL_COMMUNITY): Payer: BC Managed Care – PPO | Admitting: Physical Therapy

## 2018-02-25 ENCOUNTER — Inpatient Hospital Stay (HOSPITAL_COMMUNITY): Payer: BC Managed Care – PPO | Admitting: Occupational Therapy

## 2018-02-25 DIAGNOSIS — I5042 Chronic combined systolic (congestive) and diastolic (congestive) heart failure: Secondary | ICD-10-CM | POA: Insufficient documentation

## 2018-02-25 DIAGNOSIS — D62 Acute posthemorrhagic anemia: Secondary | ICD-10-CM

## 2018-02-25 DIAGNOSIS — E8809 Other disorders of plasma-protein metabolism, not elsewhere classified: Secondary | ICD-10-CM

## 2018-02-25 DIAGNOSIS — E46 Unspecified protein-calorie malnutrition: Secondary | ICD-10-CM

## 2018-02-25 DIAGNOSIS — N183 Chronic kidney disease, stage 3 unspecified: Secondary | ICD-10-CM

## 2018-02-25 DIAGNOSIS — E1165 Type 2 diabetes mellitus with hyperglycemia: Secondary | ICD-10-CM

## 2018-02-25 DIAGNOSIS — E1142 Type 2 diabetes mellitus with diabetic polyneuropathy: Secondary | ICD-10-CM

## 2018-02-25 DIAGNOSIS — I480 Paroxysmal atrial fibrillation: Secondary | ICD-10-CM

## 2018-02-25 LAB — GLUCOSE, CAPILLARY
GLUCOSE-CAPILLARY: 124 mg/dL — AB (ref 70–99)
Glucose-Capillary: 104 mg/dL — ABNORMAL HIGH (ref 70–99)
Glucose-Capillary: 103 mg/dL — ABNORMAL HIGH (ref 70–99)
Glucose-Capillary: 112 mg/dL — ABNORMAL HIGH (ref 70–99)

## 2018-02-25 NOTE — Progress Notes (Signed)
[place] PHYSICAL MEDICINE & REHABILITATION PROGRESS NOTE   Subjective/Complaints: Patient seen laying in bed this morning.  He states he slept well overnight.  He notes intermittent cough.  ROS: Denies CP, SOB, N/V/D  Objective:   No results found. Recent Labs    02/24/18 0624  WBC 6.2  HGB 12.7*  HCT 41.4  PLT 303   Recent Labs    02/23/18 0530 02/24/18 0624  NA 141 141  K 3.7 4.0  CL 97* 98  CO2 37* 36*  GLUCOSE 128* 140*  BUN 45* 43*  CREATININE 2.12* 1.93*  CALCIUM 8.7* 8.8*    Intake/Output Summary (Last 24 hours) at 02/25/2018 0752 Last data filed at 02/25/2018 0649 Gross per 24 hour  Intake 836 ml  Output 1450 ml  Net -614 ml     Physical Exam: Vital Signs Blood pressure 133/84, pulse 67, temperature 97.6 F (36.4 C), temperature source Oral, resp. rate 17, height 5\' 8"  (1.610 m), weight (!) 156.6 kg, SpO2 95 %.  Constitutional: No distress . Vital signs reviewed. HENT: Normocephalic.  Atraumatic. Eyes: EOMI. No discharge. Cardiovascular: RRR. No JVD. Respiratory: CTA Bilaterally. Normal effort.  + [place]. GI: BS +. Non-distended. Musc: Lower extremity edema Neurological: He isalertand oriented to person, place, and time. Follows full commands. Fair awareness of deficits Motor: 4/5 UE bilaterally.  Bilateral LE: 3+/5 prox to 4/5 distally.  Sensation intact in all 4's Skin: Blister on left shin.  Vascular changes bilateral lower extremities Psych: Normal mood.  Normal affect.  Assessment/Plan: 1. Functional deficits secondary to debility which require 3+ hours per day of interdisciplinary therapy in a comprehensive inpatient rehab setting.  Physiatrist is providing close team supervision and 24 hour management of active medical problems listed below.  Physiatrist and rehab team continue to assess barriers to discharge/monitor patient progress toward functional and medical goals  Care Tool:  Bathing    Body parts bathed by patient: Right  arm, Left arm, Chest, Abdomen, Front perineal area, Buttocks, Right upper leg, Left lower leg, Right lower leg, Left upper leg, Face         Bathing assist Assist Level: Supervision/Verbal cueing     Upper Body Dressing/Undressing Upper body dressing   What is the patient wearing?: Pull over shirt    Upper body assist Assist Level: Supervision/Verbal cueing    Lower Body Dressing/Undressing Lower body dressing      What is the patient wearing?: Underwear/pull up     Lower body assist Assist for lower body dressing: Set up assist     Toileting Toileting    Toileting assist Assist for toileting: Set up assist Assistive Device Comment: (pt uses urinal)   Transfers Chair/bed transfer  Transfers assist     Chair/bed transfer assist level: Supervision/Verbal cueing     Locomotion Ambulation   Ambulation assist      Assist level: Supervision/Verbal cueing Assistive device: Walker-rolling Max distance: 200   Walk 10 feet activity   Assist     Assist level: Supervision/Verbal cueing Assistive device: Walker-rolling   Walk 50 feet activity   Assist    Assist level: Supervision/Verbal cueing Assistive device: Walker-rolling    Walk 150 feet activity   Assist    Assist level: Supervision/Verbal cueing Assistive device: Walker-rolling    Walk 10 feet on uneven surface  activity   Assist     Assist level: Contact Guard/Touching assist Assistive device: Chemical engineer     Assist Will patient use wheelchair at  discharge?: No             Wheelchair 50 feet with 2 turns activity    Assist            Wheelchair 150 feet activity     Assist            Medical Problem List and Plan: 1.Decreased functional mobilitysecondary to cervical stenosis with cord compression, lumbar spondylosis with radiculopathy, left temporal lobe infarction.Patient is to follow-up outpatient with Dr. Kathyrn Sheriff to  discuss possible surgical interventions for lumbar radiculopathy  Continue CIR  -expect short LOS  Notes reviewed- pneumonia, spinal cord compression, small stroke, images reviewed- MRI brain showing small temporal lobe infarction, labs reviewed 2. DVT Prophylaxis/Anticoagulation: Xarelto. 3. Pain Management:Robaxin 500 mg 3 times daily, oxycodone as needed 4. Mood:Provide emotional support 5. Neuropsych: This patientiscapable of making decisions on hisown behalf. 6. Skin/Wound Care:Routine skin checks 7. Fluids/Electrolytes/Nutrition: 8. Atrial fibrillation. HR controlled. Amiodarone 200 mg twice daily,Toprol 100mg  daily 9.Chronic systolic and diastolic congestive heart failure. Monitor for any signs of fluid overload. Lasix 40 mg Daily  -daily weights   Filed Weights   02/23/18 1751 02/24/18 0510 02/25/18 0548  Weight: (!) 156.4 kg (!) 159.8 kg (!) 156.6 kg   ?  Reliability on 10/5 10. LLLpneumonia. Follow-up chest x-ray 01/03/2018 with no acute process. Completed course of Omnicef and doxycycline 11. Diabetes mellitus with peripheral neuropathy. Hemoglobin A1c 8.4. SSI. Check blood sugars before meals and at bedtime. Patient on Glucophage 1000 mg twice daily prior to admission currently on hold due to renal insufficiency  Relatively controlled on 10/5 12.CKD stage III.   Creatinine 1.93 on 10/4  Continue to monitor 13.Hyperlipidemia. Lovaza/Lipitor 14.Morbid obesity. BMI 51.99. review appropriate dietary intake, weight loss 15.Constipation. Laxative assistance 16.  Hypoalbuminemia  Supplement initiated 17.  Acute blood loss anemia  Hemoglobin 12.7 on 10/4  Continue to monitor   LOS: 2 days A FACE TO FACE EVALUATION WAS PERFORMED  Ankit Lorie Phenix 02/25/2018, 7:52 AM

## 2018-02-25 NOTE — Progress Notes (Addendum)
Physical Therapy Session Note  Patient Details  Name: Jay Chen MRN: [id number] Date of Birth: [birth year]  Today's Date: 02/25/2018 PT Individual Time: 8676-7209; 4709-6283 PT Individual Time Calculation (min): 58 min and 75 min  Short Term Goals: Week 1:  PT Short Term Goal 1 (Week 1): STG = LTG due to ELOS   Skilled Therapeutic Interventions/Progress Updates:    Session 1: Pt received supine in bed, agreeable to PT. No complaints of pain. Bed mobility with Supervision. Sit to stand with CGA to RW. Ambulation 2 x 90', x 180', x 200' with RW and Supervision, focus on increasing distance and improving endurance with gait. Ascend/descend 4 stairs x 2 reps with 2 handrails and Supervision, step-to gait pattern. Sidesteps with rail in hallway and Supervision 2 x 15 ft L/R. Bed mobility sit to supine to prone to supine to sit with SBA to simulate pt's home environment and usual sleeping position. Sit to stand 2 x 5 reps from progressively lower mat to RW with Supervision. Pt left seated in w/c in room with needs in reach at end of therapy session.  Session 2: Pt received seated in recliner in room, agreeable to PT. Pt reports some soreness in R ankle, received Tylenol from RN prior to start of therapy session. Pt transfers with Supervision to RW throughout therapy session. Ambulation x 100 ft with RW and Supervision. Trial ambulation 2 x 180 ft with rollator and Supervision as pt uses rollator in home environment. Pt demos good balance with rollator as well as good safety and management of brakes. Nustep level 4 with B UE/LE 2 x 5 min for global endurance, SpO2 90% and above. Verbal cues for pursed lip breathing techniques, increase in SpO2 to 95% (+). Seated BLE therex x 10 reps with orange theraband: marches, LAQ, heel/toe raises, hip abd/add. Toilet transfer with Supervision. Pt is Supervision for 3/3 toileting steps. Pt left seated in recliner in room with needs in reach at end of therapy  session.  Therapy Documentation Precautions:  Precautions Precautions: Fall Restrictions Weight Bearing Restrictions: No  Therapy/Group: Individual Therapy  Excell Seltzer, PT, DPT  02/25/2018, 9:01 AM

## 2018-02-25 NOTE — Plan of Care (Signed)
  Problem: Spiritual Needs Goal: Ability to function at adequate level Outcome: Progressing   Problem: Consults Goal: RH GENERAL PATIENT EDUCATION Description See Patient Education module for education specifics. Outcome: Progressing Goal: Skin Care Protocol Initiated - if Braden Score 18 or less Description If consults are not indicated, leave blank or document N/A Outcome: Progressing Goal: Diabetes Guidelines if Diabetic/Glucose > 140 Description If diabetic or lab glucose is > 140 mg/dl - Initiate Diabetes/Hyperglycemia Guidelines & Document Interventions  Outcome: Progressing   Problem: RH BOWEL ELIMINATION Goal: RH STG MANAGE BOWEL WITH ASSISTANCE Description STG Manage Bowel with min Assistance.  Outcome: Progressing Goal: RH STG MANAGE BOWEL W/MEDICATION W/ASSISTANCE Description STG Manage Bowel with Medication with min Assistance.  Outcome: Progressing   Problem: RH SKIN INTEGRITY Goal: RH STG SKIN FREE OF INFECTION/BREAKDOWN Description Patients skin will remain free from further infection or breakdown with min assist.  Outcome: Progressing Goal: RH STG MAINTAIN SKIN INTEGRITY WITH ASSISTANCE Description STG Maintain Skin Integrity With min Assistance.  Outcome: Progressing   Problem: RH SAFETY Goal: RH STG ADHERE TO SAFETY PRECAUTIONS W/ASSISTANCE/DEVICE Description STG Adhere to Safety Precautions With mod I  Assistance/Device.  Outcome: Progressing   Problem: RH PAIN MANAGEMENT Goal: RH STG PAIN MANAGED AT OR BELOW PT'S PAIN GOAL Description < 4  Outcome: Progressing

## 2018-02-25 NOTE — Progress Notes (Signed)
Occupational Therapy Session Note  Patient Details  Name: Jay Chen MRN: [id number] Date of Birth: 1965/06/28  Today's Date: 02/25/2018 OT Individual Time: 4492-0100 OT Individual Time Calculation (min): 56 min   Short Term Goals: Week 1:  OT Short Term Goal 1 (Week 1): STGs = LTGs  Skilled Therapeutic Interventions/Progress Updates:    Pt greeted in w/c, requesting to use the restroom. Started session with toilet transfer at ambulatory level using RW with supervision assist. Pt with continent B+B void, supervision for hygiene. Afterwards he doffed LB clothing and ambulated with RW into shower. He used LH sponge to reach LEs and back, close supervision for standing balance when completing perihygiene. Pt then proceeded to dress w/c level, sit<stand with device. Assist required for gripper socks and Lt shoe, with pt spending increased time attempting himself. He opted to complete grooming tasks/oral care w/c level to conserve energy. He did so with setup. For remainder of session, worked on dynamic balance, endurance, and DME safety while making his bed. Pt ambulated around bed with supervision with min vcs for RW mgt. Cued him to drape linen over walker to increase safety/ease of item transport. Afterwards pt transferred to recliner. LEs elevated for edema mgt. Pt c/o increase in Rt ankle pain at this time. Notified RN. He declined use of cryotherapy. Per pt, "It'll be fine with Tylenol." He was left with all needs within reach.   Therapy Documentation Precautions:  Precautions Precautions: Fall Restrictions Weight Bearing Restrictions: No Pain: Stated/addressed above   ADL: ADL Eating: Independent Grooming: Independent Upper Body Bathing: Independent Where Assessed-Upper Body Bathing: Shower Lower Body Bathing: Supervision/safety Where Assessed-Lower Body Bathing: Shower Upper Body Dressing: Setup Lower Body Dressing: Setup(min A for socks) Toileting: Supervision/safety Toilet  Transfer: Close supervision Toilet Transfer Method: Magazine features editor: Close supervision Social research officer, government Method: Heritage manager: Shower seat with back   Therapy/Group: Individual Therapy  Yesha Muchow A Rebekka Lobello 02/25/2018, 12:19 PM

## 2018-02-26 DIAGNOSIS — R030 Elevated blood-pressure reading, without diagnosis of hypertension: Secondary | ICD-10-CM

## 2018-02-26 LAB — GLUCOSE, CAPILLARY
GLUCOSE-CAPILLARY: 103 mg/dL — AB (ref 70–99)
GLUCOSE-CAPILLARY: 95 mg/dL (ref 70–99)
Glucose-Capillary: 96 mg/dL (ref 70–99)
Glucose-Capillary: 122 mg/dL — ABNORMAL HIGH (ref 70–99)

## 2018-02-26 NOTE — Progress Notes (Signed)
[place] PHYSICAL MEDICINE & REHABILITATION PROGRESS NOTE   Subjective/Complaints: Patient seen laying in bed this morning.  He states he slept well overnight.  He states he had a good first day of therapy yesterday.  ROS: Denies CP, SOB, N/V/D  Objective:   No results found. Recent Labs    02/24/18 0624  WBC 6.2  HGB 12.7*  HCT 41.4  PLT 303   Recent Labs    02/24/18 0624  NA 141  K 4.0  CL 98  CO2 36*  GLUCOSE 140*  BUN 43*  CREATININE 1.93*  CALCIUM 8.8*    Intake/Output Summary (Last 24 hours) at 02/26/2018 0704 Last data filed at 02/26/2018 0543 Gross per 24 hour  Intake 640 ml  Output 1300 ml  Net -660 ml     Physical Exam: Vital Signs Blood pressure (!) 134/94, pulse 76, temperature 98.8 F (37.1 C), temperature source Oral, resp. rate 18, height 5\' 8"  (1.727 m), weight (!) 157.3 kg, SpO2 (!) 89 %.  Constitutional: No distress . Vital signs reviewed. HENT: Normocephalic.  Atraumatic. Eyes: EOMI. No discharge. Cardiovascular: RRR.  No JVD. Respiratory: CTA bilaterally.  Normal effort.  + [place]. GI: BS +. Non-distended. Musc: Lower extremity edema Neurological: He isalertand oriented to person, place, and time. Follows full commands. Fair awareness of deficits Motor: 4/5 UE bilaterally, stable.  Bilateral LE: 3+/5 prox to 4/5 distally, stable.  Skin: Blister on shin with dressing C/D/I.  Vascular changes bilateral lower extremities Psych: Normal mood.  Normal affect.  Assessment/Plan: 1. Functional deficits secondary to debility which require 3+ hours per day of interdisciplinary therapy in a comprehensive inpatient rehab setting.  Physiatrist is providing close team supervision and 24 hour management of active medical problems listed below.  Physiatrist and rehab team continue to assess barriers to discharge/monitor patient progress toward functional and medical goals  Care Tool:  Bathing    Body parts bathed by patient: Right arm, Left  arm, Chest, Abdomen, Front perineal area, Buttocks, Right upper leg, Left lower leg, Right lower leg, Left upper leg, Face         Bathing assist Assist Level: Supervision/Verbal cueing     Upper Body Dressing/Undressing Upper body dressing   What is the patient wearing?: Pull over shirt    Upper body assist Assist Level: Supervision/Verbal cueing    Lower Body Dressing/Undressing Lower body dressing      What is the patient wearing?: Underwear/pull up, Pants     Lower body assist Assist for lower body dressing: Supervision/Verbal cueing     Toileting Toileting    Toileting assist Assist for toileting: Supervision/Verbal cueing Assistive Device Comment: urinal   Transfers Chair/bed transfer  Transfers assist     Chair/bed transfer assist level: Moderate Assistance - Patient 50 - 74%     Locomotion Ambulation   Ambulation assist      Assist level: Supervision/Verbal cueing Assistive device: Walker-rolling Max distance: 200'   Walk 10 feet activity   Assist     Assist level: Supervision/Verbal cueing Assistive device: Walker-rolling   Walk 50 feet activity   Assist    Assist level: Supervision/Verbal cueing Assistive device: Walker-rolling    Walk 150 feet activity   Assist    Assist level: Supervision/Verbal cueing Assistive device: Walker-rolling    Walk 10 feet on uneven surface  activity   Assist     Assist level: Contact Guard/Touching assist Assistive device: Chemical engineer     Assist Will patient use  wheelchair at discharge?: No             Wheelchair 50 feet with 2 turns activity    Assist            Wheelchair 150 feet activity     Assist            Medical Problem List and Plan: 1.Decreased functional mobilitysecondary to cervical stenosis with cord compression, lumbar spondylosis with radiculopathy, left temporal lobe infarction.Patient is to follow-up outpatient  with Dr. Kathyrn Sheriff to discuss possible surgical interventions for lumbar radiculopathy  Continue CIR  -expect short LOS 2. DVT Prophylaxis/Anticoagulation: Xarelto. 3. Pain Management:Robaxin 500 mg 3 times daily, oxycodone as needed 4. Mood:Provide emotional support 5. Neuropsych: This patientiscapable of making decisions on hisown behalf. 6. Skin/Wound Care:Routine skin checks 7. Fluids/Electrolytes/Nutrition: 8. Atrial fibrillation. HR controlled. Amiodarone 200 mg twice daily,Toprol 100mg  daily 9.Chronic systolic and diastolic congestive heart failure. Monitor for any signs of fluid overload. Lasix 40 mg Daily  -daily weights   Filed Weights   02/24/18 0510 02/25/18 0548 02/26/18 0701  Weight: (!) 159.8 kg (!) 156.6 kg (!) 157.3 kg   ?  Reliability on 10/6, however overall stable 10. LLLpneumonia. Follow-up chest x-ray 01/03/2018 with no acute process. Completed course of Omnicef and doxycycline 11. Diabetes mellitus with peripheral neuropathy. Hemoglobin A1c 8.4. SSI. Check blood sugars before meals and at bedtime. Patient on Glucophage 1000 mg twice daily prior to admission currently on hold due to renal insufficiency  Controlled on 10/6 12.CKD stage III.   Creatinine 1.93 on 10/4  Labs ordered for tomorrow  Continue to monitor 13.Hyperlipidemia. Lovaza/Lipitor 14.Morbid obesity. BMI 51.99. review appropriate dietary intake, weight loss 15.Constipation. Laxative assistance 16.  Hypoalbuminemia  Supplement initiated 17.  Acute blood loss anemia  Hemoglobin 12.7 on 10/4  Continue to monitor 18.  Elevated diastolic blood pressures  Labile, but elevated on 10/6   LOS: 3 days A FACE TO FACE EVALUATION WAS PERFORMED  Ankit Lorie Phenix 02/26/2018, 7:04 AM

## 2018-02-26 NOTE — Plan of Care (Signed)
  Problem: RH SKIN INTEGRITY Goal: RH STG SKIN FREE OF INFECTION/BREAKDOWN Description Patients skin will remain free from further infection or breakdown with min assist.  Outcome: Progressing Goal: RH STG MAINTAIN SKIN INTEGRITY WITH ASSISTANCE Description STG Maintain Skin Integrity With min Assistance.  Outcome: Progressing   Problem: RH SAFETY Goal: RH STG ADHERE TO SAFETY PRECAUTIONS W/ASSISTANCE/DEVICE Description STG Adhere to Safety Precautions With mod I  Assistance/Device.  Outcome: Progressing  Skin remains intact. Proper footwear, and call light at hand

## 2018-02-27 ENCOUNTER — Inpatient Hospital Stay (HOSPITAL_COMMUNITY): Payer: BC Managed Care – PPO | Admitting: Physical Therapy

## 2018-02-27 ENCOUNTER — Inpatient Hospital Stay (HOSPITAL_COMMUNITY): Payer: BC Managed Care – PPO | Admitting: Occupational Therapy

## 2018-02-27 LAB — CBC
HCT: 43.9 % (ref 39.0–52.0)
HEMOGLOBIN: 13.6 g/dL (ref 13.0–17.0)
MCH: 31.1 pg (ref 26.0–34.0)
MCHC: 31 g/dL (ref 30.0–36.0)
MCV: 100.2 fL — AB (ref 78.0–100.0)
Platelets: 350 10*3/uL (ref 150–400)
RBC: 4.38 MIL/uL (ref 4.22–5.81)
RDW: 15.8 % — ABNORMAL HIGH (ref 11.5–15.5)
WBC: 6.1 10*3/uL (ref 4.0–10.5)

## 2018-02-27 LAB — BASIC METABOLIC PANEL
Anion gap: 6 (ref 5–15)
BUN: 43 mg/dL — AB (ref 6–20)
CHLORIDE: 98 mmol/L (ref 98–111)
CO2: 34 mmol/L — AB (ref 22–32)
Calcium: 8.6 mg/dL — ABNORMAL LOW (ref 8.9–10.3)
Creatinine, Ser: 2.02 mg/dL — ABNORMAL HIGH (ref 0.61–1.24)
GFR calc Af Amer: 41 mL/min — ABNORMAL LOW (ref >60)
GFR calc non Af Amer: 36 mL/min — ABNORMAL LOW (ref >60)
GLUCOSE: 125 mg/dL — AB (ref 70–99)
POTASSIUM: 3.9 mmol/L (ref 3.5–5.1)
Sodium: 138 mmol/L (ref 135–145)

## 2018-02-27 LAB — GLUCOSE, CAPILLARY
GLUCOSE-CAPILLARY: 105 mg/dL — AB (ref 70–99)
Glucose-Capillary: 116 mg/dL — ABNORMAL HIGH (ref 70–99)
Glucose-Capillary: 96 mg/dL (ref 70–99)
Glucose-Capillary: 152 mg/dL — ABNORMAL HIGH (ref 70–99)

## 2018-02-27 NOTE — Plan of Care (Signed)
  Problem: RH SAFETY Goal: RH STG ADHERE TO SAFETY PRECAUTIONS W/ASSISTANCE/DEVICE Description STG Adhere to Safety Precautions With mod I  Assistance/Device.  Outcome: Progressing  Call light within reach, bed/chair alarm, proper footwear

## 2018-02-27 NOTE — Progress Notes (Addendum)
Advanced Heart Failure Rounding Note  PCP: [name], MD Primary Cardiologist: Dr [name]  Subjective:    Continues to diurese on torsemide 40 mg daily. Weight down ?6 lbs overnight (bed weight). Standing weight requested.  Creatinine stable 1.93 > 2.02. SBP 90-130s.  Mild SOB when up and walking around with rollator. No CP. Feels like he is getting stronger. On RA.  Objective:   Weight Range: (!) 154.6 kg Body mass index is 51.82 kg/m.   Vital Signs:   Temp:  [97.9 F (36.6 C)-98.7 F (37.1 C)] 98 F (36.7 C) (10/07 0552) Pulse Rate:  [61-84] 84 (10/07 0820) Resp:  [18-19] 19 (10/07 0552) BP: (99-132)/(53-74) 99/53 (10/07 0820) SpO2:  [92 %-98 %] 92 % (10/07 0820) Weight:  [154.6 kg] 154.6 kg (10/07 0500) Last BM Date: 02/26/18  Weight change: Filed Weights   02/25/18 0548 02/26/18 0701 02/27/18 0500  Weight: (!) 156.6 kg (!) 157.3 kg (!) 154.6 kg    Intake/Output:   Intake/Output Summary (Last 24 hours) at 02/27/2018 0904 Last data filed at 02/27/2018 0814 Gross per 24 hour  Intake 960 ml  Output 1250 ml  Net -290 ml      Physical Exam    General: No resp difficulty. HEENT: Normal Neck: Supple. JVP ~7. Carotids 2+ bilat; no bruits. No thyromegaly or nodule noted. Cor: PMI nondisplaced. RRR occasioanl ectopy , No M/G/R noted Lungs: CTAB, normal effort. On RA Abdomen: obese, soft, non-tender, non-distended, no HSM. No bruits or masses. +BS  Extremities: No cyanosis, clubbing, or rash. R and LLE 1-2+ edema. Neuro: Alert & orientedx3, cranial nerves grossly intact. moves all 4 extremities w/o difficulty. Affect pleasant  Labs    CBC Recent Labs    02/27/18 0729  WBC 6.1  HGB 13.6  HCT 43.9  MCV 100.2*  PLT 220   Basic Metabolic Panel Recent Labs    02/27/18 0729  NA 138  K 3.9  CL 98  CO2 34*  GLUCOSE 125*  BUN 43*  CREATININE 2.02*  CALCIUM 8.6*   Liver Function Tests No results for input(s): AST, ALT, ALKPHOS,  BILITOT, PROT, ALBUMIN in the last 72 hours. No results for input(s): LIPASE, AMYLASE in the last 72 hours. Cardiac Enzymes No results for input(s): CKTOTAL, CKMB, CKMBINDEX, TROPONINI in the last 72 hours.  BNP: BNP (last 3 results) Recent Labs    12/27/17 2354 02/15/18 1559  BNP 283.3* 847.1*    ProBNP (last 3 results) No results for input(s): PROBNP in the last 8760 hours.   D-Dimer No results for input(s): DDIMER in the last 72 hours. Hemoglobin A1C No results for input(s): HGBA1C in the last 72 hours. Fasting Lipid Panel No results for input(s): CHOL, HDL, LDLCALC, TRIG, CHOLHDL, LDLDIRECT in the last 72 hours. Thyroid Function Tests No results for input(s): TSH, T4TOTAL, T3FREE, THYROIDAB in the last 72 hours.  Invalid input(s): FREET3  Other results:   Imaging    No results found.   Medications:     Scheduled Medications: . allopurinol  100 mg Oral Daily  . amiodarone  200 mg Oral BID  . aspirin EC  81 mg Oral Daily  . atorvastatin  80 mg Oral q1800  . feeding supplement (PRO-STAT SUGAR FREE 64)  30 mL Oral BID  . hydrALAZINE  75 mg Oral Q8H  . insulin aspart  0-15 Units Subcutaneous TID WC  . insulin aspart  0-5 Units Subcutaneous QHS  . isosorbide mononitrate  30  mg Oral Daily  . metoprolol succinate  75 mg Oral Daily  . omega-3 acid ethyl esters  1 g Oral BID  . potassium chloride  40 mEq Oral Daily  . rivaroxaban  20 mg Oral Q supper  . torsemide  40 mg Oral Daily    Infusions:   PRN Medications: acetaminophen, alum & mag hydroxide-simeth, bisacodyl, diphenhydrAMINE, guaiFENesin-dextromethorphan, polyethylene glycol, prochlorperazine **OR** prochlorperazine **OR** prochlorperazine, sodium phosphate, traZODone    Patient Profile   Jay Chen is a 52 y.o. male with acute on chronic combined CHF, presumed non-ischemic (no cath), EF 20-25%, AAA 4.7 cm by echo, hx of CVA, HTN, HLD, OSA, DM2, CKD III (Baseline Cr 1.7 - 1.9), and morbid  obesity (Body mass index is 56.56 kg/m.)   Admitted from [facility] clinic 02/15/18 with acute on chronic combined CHF and marked volume overload.    Assessment/Plan   1. Acute on chronic combined CHF, presumed non-ischemic (no cath) - Echo 12/2017 EF 20-25% - R/LHC normal cors. RA 17 PCWP 21 - Volume status okay.  - Continue 40 mg po torsemide daily - Continue Toprol 75  daily - Continue hydralazine 75 mg tid.  - Continue imdur 30 mg daily - No ARB/ARNi/spiro currently with CKD 3  2. AKI on CKD III - Baseline Cr 1.7 - 1.9 - Creatinine 1.93 > 2.02 today     3. Persistent Afib/Aflutter - New diagnosis 08/2017, which was being treated with rate control.   -S/P DC-CV on 10/2.  - Regular on exam.  - Continue amio to 200 mg twice a day. Decrease amiodarone to 200 mg daily on 03/03/2018  - Continue  Toprol 75 mg daily.  - Continue Xarelto 20 mg daily   4. H/o CVA - Only remaining deficit.  - On Xarelto chronically. No change.     5. HTN - Stable.  - Cr borderline for ARB/ARNi/Spiro.    6. AAA  - 4.7 cm by echo 12/2017  - Will need further imaging as creatinine permits. No change.    7. OSA - Non-compliant with CPAP. Discussed importance of CPAP. No change.   8. DM2 - Stable CBGs. Continue SSI - Hgb A1C 7.1 01/24/18   9. Morbid obesity -Body mass index is 51.82 kg/m. - Needs significant weight loss.  - Nutrition consult ordered  10. Severe Deconditioning.  CIR appreciated.    Length of Stay: [place], NP  02/27/2018, 9:04 AM  Advanced Heart Failure Team Pager [phone] (M-F; 7a - 4p)  Please contact [facility] for night-coverage after hours (4p -7a ) and weekends on [email or web address]  Patient seen and examined with the above-signed Advanced Practice Provider and/or Housestaff. I personally reviewed laboratory data, imaging studies and relevant notes. I independently examined the patient and formulated the important aspects of the plan. I have edited the note to  reflect any of my changes or salient points. I have personally discussed the plan with the patient and/or family.  Continues to improve. Volume status still mildly elevated. Would continue torsemide at current does. Renal function stable. On exam appears to still be in NSR but having some ectopy. Will repeat ECG in am. Continue Xarelto.   Glori Bickers, MD  6:53 PM

## 2018-02-27 NOTE — Progress Notes (Signed)
Physical Therapy Session Note  Patient Details  Name: Jay Chen MRN: [id number] Date of Birth: [birth year]  Today's Date: 02/27/2018 PT Individual Time: 1000-1115 PT Individual Time Calculation (min): 75 min   Short Term Goals: Week 1:  PT Short Term Goal 1 (Week 1): STG = LTG due to ELOS   Skilled Therapeutic Interventions/Progress Updates:    Pt received seated in recliner in room, agreeable to PT. No complaints of pain. Pt transfers with Supervision to rollator or RW thorughout therapy session. Toilet transfer with Supervision with rollator, Supervision for 3/3 toileting steps. Ambulation 2 x 200 ft with rollator and Supervision. Ascend/descend 4 stairs x 2 reps with 2 handrails and Supervision, step-to gait pattern. Functional transfers sit to/from stand from couch and recliner to simulate home environment and transfers from low, pliable surfaces, Supervision with rollator. Sidesteps 3 x 10 ft L/R with BUE support and Supervision for B hip strengthening. Standing balance with no UE support and modified tandem stance while completing ball toss, standing balance no UE support and normal stance on red wedge for gastroc stretch while completing ball toss. Pt tolerates standing about 3 min before onset of fatigue. Static balance on foam: Romberg stance and modified tandem stance 3 x 30 sec each with no UE support and close SBA for balance. Pt left seated in recliner in room with needs in reach.  Therapy Documentation Precautions:  Precautions Precautions: Fall Restrictions Weight Bearing Restrictions: No  Therapy/Group: Individual Therapy  Excell Seltzer, PT, DPT  02/27/2018, 11:15 AM

## 2018-02-27 NOTE — Progress Notes (Signed)
Occupational Therapy Session Note  Patient Details  Name: ASHAZ ROBLING MRN: [id number] Date of Birth: 1966-05-05  Today's Date: 02/27/2018 OT Individual Time: 2633-3545 OT Individual Time Calculation (min): 69 min    Short Term Goals: Week 1:  OT Short Term Goal 1 (Week 1): STGs = LTGs  Skilled Therapeutic Interventions/Progress Updates:    Upon entering the room, pt supine in bed with no c/o pain and agreeable to OT intervention. Pt ambulating with rollator to dresser to obtain needed clothing items with supervision. Pt declined shower this session. Pt seated on EOB and donning clothing with overall supervision. Pt standing at sink with rollator for grooming tasks with supervision as well. Pt ambulating 200' with rollator to ADL apartment with supervision. Pt verbalized he was unable to get AD into bathroom and because it is so small her "furniture walks" to tub for tub shower. Pt demonstrated technique with close supervision and seated on TTB with close supervision for simulated shower transfer. Pt ambulating 150' to day room in same manner as above. Pt performed 5 minutes on Nustep and verbal cuing for pursed lip breathing and O2 on room air remaining at 93-95% throughout session. Pt returning to room at end of session and seated in recliner chair with overall supervision and call bell within reach.   Therapy Documentation Precautions:  Precautions Precautions: Fall Restrictions Weight Bearing Restrictions: No General:   Vital Signs: Therapy Vitals Temp: 98 F (36.7 C) Pulse Rate: 84 Resp: 19 BP: (!) 99/53 Patient Position (if appropriate): Lying Oxygen Therapy SpO2: 92 % O2 Device: Nasal Cannula O2 Flow Rate (L/min): 8 L/min Pain:   ADL: ADL Eating: Independent Grooming: Independent Upper Body Bathing: Independent Where Assessed-Upper Body Bathing: Shower Lower Body Bathing: Supervision/safety Where Assessed-Lower Body Bathing: Shower Upper Body Dressing:  Setup Lower Body Dressing: Setup(min A for socks) Toileting: Supervision/safety Toilet Transfer: Close supervision Toilet Transfer Method: Magazine features editor: Close supervision Social research officer, government Method: Heritage manager: Civil engineer, contracting with back Glass blower/designer   Exercises:   Other Treatments:     Therapy/Group: Individual Therapy  Gypsy Decant 02/27/2018, 9:26 AM

## 2018-02-27 NOTE — Progress Notes (Signed)
Physical Therapy Session Note  Patient Details  Name: Jay Chen MRN: [id number] Date of Birth: April 18, 1966  Today's Date: 02/27/2018 PT Individual Time: 1300-1345 PT Individual Time Calculation (min): 45 min   Short Term Goals: Week 1:  PT Short Term Goal 1 (Week 1): STG = LTG due to ELOS   Skilled Therapeutic Interventions/Progress Updates:    no c/o pain.  Session focus on balance and LE strengthening.  PT administered BERG Balance Scale and Patient demonstrates significant fall risk as noted by score of 44/56 on Berg Balance Scale.  (<36= high risk for falls, close to 100%; 37-45 significant >80%; 46-51 moderate >50%; 52-55 lower >25%).  Recommending continued use of rollator with functional mobility and supervision until score improves.  LE strengthening/balance x10 reps of sit>stand>minisquat>sit, sit>stand>marching RLE/LLE>sit, and sit>stand>heel/toe raise>sit.  Pt requires extended seated rest break following each exercises.  Gait throughout unit max distance 200' with rollator and distant supervision.  Toileting at end of session with set up assist and pt returned to recliner at end of session.  Call bell in reach and needs met.    Therapy Documentation Precautions:  Precautions Precautions: Fall Restrictions Weight Bearing Restrictions: No  Balance: Standardized Balance Assessment Standardized Balance Assessment: Berg Balance Test Berg Balance Test Sit to Stand: Able to stand without using hands and stabilize independently Standing Unsupported: Able to stand safely 2 minutes Sitting with Back Unsupported but Feet Supported on Floor or Stool: Able to sit safely and securely 2 minutes Stand to Sit: Controls descent by using hands Transfers: Able to transfer safely, definite need of hands Standing Unsupported with Eyes Closed: Able to stand 10 seconds safely Standing Ubsupported with Feet Together: Able to place feet together independently and stand 1 minute safely(limited  by body habitus) From Standing, Reach Forward with Outstretched Arm: Can reach confidently >25 cm (10") From Standing Position, Pick up Object from Floor: Able to pick up shoe safely and easily From Standing Position, Turn to Look Behind Over each Shoulder: Looks behind from both sides and weight shifts well Turn 360 Degrees: Able to turn 360 degrees safely but slowly Standing Unsupported, Alternately Place Feet on Step/Stool: Able to complete >2 steps/needs minimal assist Standing Unsupported, One Foot in Front: Able to take small step independently and hold 30 seconds Standing on One Leg: Tries to lift leg/unable to hold 3 seconds but remains standing independently Total Score: 44    Therapy/Group: Individual Therapy  [name] 02/27/2018, 1:50 PM

## 2018-02-27 NOTE — Progress Notes (Signed)
[place] PHYSICAL MEDICINE & REHABILITATION PROGRESS NOTE   Subjective/Complaints: No new issues. Breathing well. Denies cough  ROS: Patient denies fever, rash, sore throat, blurred vision, nausea, vomiting, diarrhea, cough, shortness of breath or chest pain, joint or back pain, headache, or mood change.    Objective:   No results found. Recent Labs    02/27/18 0729  WBC 6.1  HGB 13.6  HCT 43.9  PLT 350   Recent Labs    02/27/18 0729  NA 138  K 3.9  CL 98  CO2 34*  GLUCOSE 125*  BUN 43*  CREATININE 2.02*  CALCIUM 8.6*    Intake/Output Summary (Last 24 hours) at 02/27/2018 0840 Last data filed at 02/27/2018 0814 Gross per 24 hour  Intake 960 ml  Output 1250 ml  Net -290 ml     Physical Exam: Vital Signs Blood pressure (!) 99/53, pulse 84, temperature 98 F (36.7 C), resp. rate 19, height 5\' 8"  (1.727 m), weight (!) 154.6 kg, SpO2 92 %.  Constitutional: No distress . Vital signs reviewed. HEENT: EOMI, oral membranes moist Neck: supple Cardiovascular: RRR without murmur. No JVD    Respiratory: ?wheezes no rales. Normal effort    GI: BS +, non-tender, non-distended  Musc: Lower extremity edema 1-2+ Neurological: He isalertand oriented to person, place, and time. Follows full commands. Fair awareness of deficits Motor: 4/5 UE bilaterally, stable.  Bilateral LE: 3+/5 prox to 4/5 distally, stable.  Skin: Blister on shin with dressing C/D/I.--no changes  Vascular changes bilateral lower extremities Psych: Normal mood.  Normal affect.  Assessment/Plan: 1. Functional deficits secondary to debility which require 3+ hours per day of interdisciplinary therapy in a comprehensive inpatient rehab setting.  Physiatrist is providing close team supervision and 24 hour management of active medical problems listed below.  Physiatrist and rehab team continue to assess barriers to discharge/monitor patient progress toward functional and medical goals  Care  Tool:  Bathing    Body parts bathed by patient: Right arm, Left arm, Chest, Abdomen, Front perineal area, Buttocks, Right upper leg, Left lower leg, Right lower leg, Left upper leg, Face     Body parts n/a: Buttocks   Bathing assist Assist Level: Minimal Assistance - Patient > 75%     Upper Body Dressing/Undressing Upper body dressing   What is the patient wearing?: Pull over shirt    Upper body assist Assist Level: Supervision/Verbal cueing    Lower Body Dressing/Undressing Lower body dressing      What is the patient wearing?: Underwear/pull up     Lower body assist Assist for lower body dressing: Supervision/Verbal cueing     Toileting Toileting    Toileting assist Assist for toileting: Minimal Assistance - Patient > 75% Assistive Device Comment: urinal   Transfers Chair/bed transfer  Transfers assist     Chair/bed transfer assist level: Supervision/Verbal cueing     Locomotion Ambulation   Ambulation assist      Assist level: Set up assist Assistive device: Walker-rolling Max distance: 200'   Walk 10 feet activity   Assist     Assist level: Set up assist Assistive device: Walker-rolling   Walk 50 feet activity   Assist    Assist level: Supervision/Verbal cueing Assistive device: Walker-rolling    Walk 150 feet activity   Assist    Assist level: Supervision/Verbal cueing Assistive device: Walker-rolling    Walk 10 feet on uneven surface  activity   Assist     Assist level: Contact Guard/Touching assist  Assistive device: Aeronautical engineer Will patient use wheelchair at discharge?: No             Wheelchair 50 feet with 2 turns activity    Assist            Wheelchair 150 feet activity     Assist            Medical Problem List and Plan: 1.Decreased functional mobilitysecondary to cervical stenosis with cord compression, lumbar spondylosis with radiculopathy, left  temporal lobe infarction.Patient is to follow-up outpatient with Dr. Kathyrn Sheriff to discuss possible surgical interventions for lumbar radiculopathy  Continue CIR  -team conf tomorrow 2. DVT Prophylaxis/Anticoagulation: Xarelto. 3. Pain Management:Robaxin 500 mg 3 times daily, oxycodone as needed 4. Mood:Provide emotional support 5. Neuropsych: This patientiscapable of making decisions on hisown behalf. 6. Skin/Wound Care:Routine skin checks 7. Fluids/Electrolytes/Nutrition: 8. Atrial fibrillation. HR controlled. Amiodarone 200 mg twice daily,Toprol 100mg  daily 9.Chronic systolic and diastolic congestive heart failure. Monitor for any signs of fluid overload. Lasix 40 mg Daily  -daily weights   Filed Weights   02/25/18 0548 02/26/18 0701 02/27/18 0500  Weight: (!) 156.6 kg (!) 157.3 kg (!) 154.6 kg   Weights stable 10. LLLpneumonia.   Completed course of Omnicef and doxycycline  -wean oxygen 11. Diabetes mellitus with peripheral neuropathy. Hemoglobin A1c 8.4. SSI. Check blood sugars before meals and at bedtime. Patient on Glucophage 1000 mg twice daily prior to admission currently on hold due to renal insufficiency  Controlled on 10/7 12.CKD stage III.   Creatinine stable in the 1.8-2.0 range 10/7     Continue to monitor 13.Hyperlipidemia. Lovaza/Lipitor 14.Morbid obesity. BMI 51.99. review appropriate dietary intake, weight loss 15.Constipation. Laxative assistance 16.  Hypoalbuminemia  Supplement initiated 17.  Acute blood loss anemia  Hemoglobin 12.7 on 10/4  Continue to monitor 18.  Elevated diastolic blood pressures  Good control 10.7    LOS: 4 days A FACE TO FACE EVALUATION WAS PERFORMED  Meredith Staggers 02/27/2018, 8:40 AM

## 2018-02-28 ENCOUNTER — Inpatient Hospital Stay (HOSPITAL_COMMUNITY): Payer: BC Managed Care – PPO | Admitting: Occupational Therapy

## 2018-02-28 ENCOUNTER — Inpatient Hospital Stay (HOSPITAL_COMMUNITY): Payer: BC Managed Care – PPO | Admitting: Physical Therapy

## 2018-02-28 LAB — BASIC METABOLIC PANEL
ANION GAP: 9 (ref 5–15)
BUN: 43 mg/dL — ABNORMAL HIGH (ref 6–20)
CALCIUM: 8.6 mg/dL — AB (ref 8.9–10.3)
CO2: 33 mmol/L — AB (ref 22–32)
Chloride: 99 mmol/L (ref 98–111)
Creatinine, Ser: 2.11 mg/dL — ABNORMAL HIGH (ref 0.61–1.24)
GFR calc Af Amer: 39 mL/min — ABNORMAL LOW (ref >60)
GFR, EST NON AFRICAN AMERICAN: 34 mL/min — AB (ref >60)
GLUCOSE: 119 mg/dL — AB (ref 70–99)
POTASSIUM: 3.7 mmol/L (ref 3.5–5.1)
SODIUM: 141 mmol/L (ref 135–145)

## 2018-02-28 LAB — GLUCOSE, CAPILLARY
GLUCOSE-CAPILLARY: 125 mg/dL — AB (ref 70–99)
GLUCOSE-CAPILLARY: 98 mg/dL (ref 70–99)
GLUCOSE-CAPILLARY: 97 mg/dL (ref 70–99)
Glucose-Capillary: 131 mg/dL — ABNORMAL HIGH (ref 70–99)

## 2018-02-28 NOTE — Progress Notes (Signed)
[place] PHYSICAL MEDICINE & REHABILITATION PROGRESS NOTE   Subjective/Complaints: Pt in bed. No new complaints. Didn't get up for therapy because of EKG results. Denies CP, SOB, cough, palpitations  ROS: Patient denies fever, rash, sore throat, blurred vision, nausea, vomiting, diarrhea, cough,   joint or back pain, headache, or mood change.   Objective:   No results found. Recent Labs    02/27/18 0729  WBC 6.1  HGB 13.6  HCT 43.9  PLT 350   Recent Labs    02/27/18 0729 02/28/18 0403  NA 138 141  K 3.9 3.7  CL 98 99  CO2 34* 33*  GLUCOSE 125* 119*  BUN 43* 43*  CREATININE 2.02* 2.11*  CALCIUM 8.6* 8.6*    Intake/Output Summary (Last 24 hours) at 02/28/2018 0831 Last data filed at 02/28/2018 0826 Gross per 24 hour  Intake 1012 ml  Output 1250 ml  Net -238 ml     Physical Exam: Vital Signs Blood pressure 116/90, pulse (!) 113, temperature 97.9 F (36.6 C), temperature source Oral, resp. rate 18, height 5\' 8"  (1.727 m), weight (!) 155.7 kg, SpO2 94 %.  Constitutional: No distress . Vital signs reviewed. HEENT: EOMI, oral membranes moist Neck: supple Cardiovascular: tachycardic around 105-110,  without murmur. No JVD    Respiratory: CTA Bilaterally without wheezes or rales. Normal effort    GI: BS +, non-tender, non-distended  Musc: Lower extremity edema 1-2+ Neurological: He isalertand oriented to person, place, and time. Reasonable insight Motor: 4/5 UE bilaterally, stable.  Bilateral LE: 3+/5 prox to 4/5 distally, stable.  Skin: blister on right shin, wound with foam dressing Psych: very flat.  Assessment/Plan: 1. Functional deficits secondary to debility which require 3+ hours per day of interdisciplinary therapy in a comprehensive inpatient rehab setting.  Physiatrist is providing close team supervision and 24 hour management of active medical problems listed below.  Physiatrist and rehab team continue to assess barriers to discharge/monitor  patient progress toward functional and medical goals  Care Tool:  Bathing  Bathing activity did not occur: Refused Body parts bathed by patient: Right arm, Left arm, Chest, Abdomen, Front perineal area, Buttocks, Right upper leg, Left lower leg, Right lower leg, Left upper leg, Face     Body parts n/a: Buttocks   Bathing assist Assist Level: Minimal Assistance - Patient > 75%     Upper Body Dressing/Undressing Upper body dressing   What is the patient wearing?: Pull over shirt    Upper body assist Assist Level: Supervision/Verbal cueing    Lower Body Dressing/Undressing Lower body dressing      What is the patient wearing?: Pants     Lower body assist Assist for lower body dressing: Supervision/Verbal cueing     Toileting Toileting    Toileting assist Assist for toileting: Supervision/Verbal cueing Assistive Device Comment: rollator   Transfers Chair/bed transfer  Transfers assist     Chair/bed transfer assist level: Supervision/Verbal cueing     Locomotion Ambulation   Ambulation assist      Assist level: Supervision/Verbal cueing Assistive device: Rollator Max distance: 200   Walk 10 feet activity   Assist     Assist level: Supervision/Verbal cueing Assistive device: Rollator   Walk 50 feet activity   Assist    Assist level: Supervision/Verbal cueing Assistive device: Rollator    Walk 150 feet activity   Assist    Assist level: Supervision/Verbal cueing Assistive device: Rollator    Walk 10 feet on uneven surface  activity  Assist     Assist level: Contact Guard/Touching assist Assistive device: Walker-rolling   Wheelchair     Assist Will patient use wheelchair at discharge?: No             Wheelchair 50 feet with 2 turns activity    Assist            Wheelchair 150 feet activity     Assist            Medical Problem List and Plan: 1.Decreased functional mobilitysecondary to  cervical stenosis with cord compression, lumbar spondylosis with radiculopathy, left temporal lobe infarction.Patient is to follow-up outpatient with Dr. Kathyrn Sheriff to discuss possible surgical interventions for lumbar radiculopathy  Continue CIR  -team conf today  -exam/EKG reveal sinus tach. One PVC on ekg.    -pt may mobilize with therapy 2. DVT Prophylaxis/Anticoagulation: Xarelto. 3. Pain Management:Robaxin 500 mg 3 times daily, oxycodone as needed 4. Mood:Provide emotional support 5. Neuropsych: This patientiscapable of making decisions on hisown behalf. 6. Skin/Wound Care:Routine skin checks 7. Fluids/Electrolytes/Nutrition: 8. Atrial fibrillation. HR sl elevated this morning. Amiodarone 200 mg twice daily,Toprol 100mg  daily 9.Chronic systolic and diastolic congestive heart failure. Monitor for any signs of fluid overload. Lasix 40 mg Daily  -daily weights   Filed Weights   02/27/18 0500 02/27/18 1506 02/28/18 0456  Weight: (!) 154.6 kg (!) 154.7 kg (!) 155.7 kg   Weights stable around 155kg  -cardiology following  -may get OOB 10. LLLpneumonia.   Completed course of Omnicef and doxycycline  -wean oxygen 11. Diabetes mellitus with peripheral neuropathy. Hemoglobin A1c 8.4. SSI. Check blood sugars before meals and at bedtime. Patient on Glucophage 1000 mg twice daily prior to admission currently on hold due to renal insufficiency  Controlled on 10/8 12.CKD stage III.   Creatinine sl increased to 2.11 but has remained in the 1.8-2.1 range     Continue to check serial labs 13.Hyperlipidemia. Lovaza/Lipitor 14.Morbid obesity. BMI 51.99. review appropriate dietary intake, weight loss 15.Constipation. Laxative assistance 16.  Hypoalbuminemia  Supplement initiated 17.  Acute blood loss anemia  Hemoglobin 12.7 on 10/4  Continue to monitor 18.  Elevated diastolic blood pressures  Persistent. Defer to cardiology   LOS: 5 days A FACE TO  [place] 02/28/2018, 8:31 AM

## 2018-02-28 NOTE — Progress Notes (Signed)
Pt is to be discharged from CIR tomorrow morning.   Discussed options with pt of either DCCV tomorrow after getting discharged vs scheduling for later on. He would like to go ahead and have the procedure tomorrow. I have updated his nurse and Pam, CIR PA. He will be discharged from CIR in morning and will be dropped off at short stay/admitting for outpatient DCCV.  Called central scheduling and changed DCCV to outpatient. He is still scheduled for 2 pm tomorrow.  Georgiana Shore, NP

## 2018-02-28 NOTE — Discharge Summary (Signed)
Physician Discharge Summary  Patient ID: MC Jay Chen MRN: [id number] DOB/AGE: Aug 06, 1965 52 y.o.  Admit date: 02/23/2018 Discharge date: 03/01/2018  Discharge Diagnoses:  Principal Problem:   Debility Active Problems:   Acute on chronic systolic heart failure (HCC)   Diabetes mellitus with neuropathy (HCC)   Acute blood loss anemia   Hypoalbuminemia due to protein-calorie malnutrition (HCC)   CKD (chronic kidney disease), stage III (HCC)   PAF (paroxysmal atrial fibrillation) (HCC)   Discharged Condition: stable   Significant Diagnostic Studies: N/A   Labs:  Basic Metabolic Panel: Recent Labs  Lab 02/24/18 0624 02/27/18 0729 02/28/18 0403 03/01/18 0613  NA 141 138 141 139  K 4.0 3.9 3.7 3.8  CL 98 98 99 100  CO2 36* 34* 33* 32  GLUCOSE 140* 125* 119* 141*  BUN 43* 43* 43* 46*  CREATININE 1.93* 2.02* 2.11* 2.12*  CALCIUM 8.8* 8.6* 8.6* 8.8*    CBC: Recent Labs  Lab 02/24/18 0624 02/27/18 0729  WBC 6.2 6.1  NEUTROABS 4.1  --   HGB 12.7* 13.6  HCT 41.4 43.9  MCV 101.0* 100.2*  PLT 303 350    CBG: Recent Labs  Lab 02/28/18 0630 02/28/18 1149 02/28/18 1703 02/28/18 2129 03/01/18 0624  GLUCAP 125* 98 97 131* 141*    Brief HPI:   Jay Chen is a 52 year old male with history of CKD, HTN, A. fib, L-CVA 12/2017, cord compression with cervical myelopathy, chronic systolic CHF; who was admitted on 02/15/2018 with acute on chronic CHF, weight gain of 25 pounds, left lower extremity edema and worsening of dyspnea.  He was started on IV diuresis with weight now down by 37 pounds.  Dr. [name] consulted for management of CHF and patient underwent cardiac cath revealing EF of 20 to 25% with normal coronaries.  Amiodarone was increased due to persistent A. fib and he underwent DCCV on 10/3 prior to admission.  Therapy is been ongoing and patient continues to be limited by fatigue with poor safety awareness.  CIR was recommended due to functional  deficits   Hospital Course: [name] was admitted to rehab 02/23/2018 for inpatient therapies to consist of PT and OT at least three hours five days a week. Past admission physiatrist, therapy team and rehab RN have worked together to provide customized collaborative inpatient rehab.  Weights have been monitored daily and he continues to show signs of fluid overload requiring adjustment in his furosemide dosing.  Renal status has been monitoring closely and serum creatinine is an upward trend.  He does continue to show signs of overload and cardiology has been following closely for management.    Diabetes has been monitored with a CHF CBG checks.  Metformin was discontinued due to CKD.  P.o. intake has been good and blood sugars are well controlled on modified diet.  On 10/08, he developed chest discomfort and was found to be back in A. Fib.  DCCV was recommended for treatment and patient was agreeable to said procedure.  Patient had completed his rehab course and was modified independent.  He was discharged on 10/9 with plans to undergo DCCV prior to discharge to home.  He will continue to receive further follow-up home health PT/OT/speech therapy by [place] [place] care after discharge   Rehab course: During patient's stay in rehab weekly team conferences were held to monitor patient's progress, set goals and discuss barriers to discharge. At admission, patient required supervision with mobility and basic self-care tasks. He  has had improvement in activity tolerance, balance, postural control as well as ability to compensate for deficits.  He is able to complete ADL tasks at modified independent level.  He is modified independent for transfers and is able to ambulate 300 feet with Rollator.  Able to climb 12 stairs with assistive device at independent level.  Patient's care partner to provide assistance as needed after discharge   Disposition:  Home  Diet: Heart healthy/carb modified  Special  Instructions: 1.  No strenuous activity.  No driving until cleared by MD. 2.  Check weights daily.   Discharge Instructions    Ambulatory referral to Physical Medicine Rehab   Complete by:  As directed    1-2 transitional care appt     Allergies as of 03/01/2018   No Known Allergies     Medication List    STOP taking these medications   guaiFENesin-dextromethorphan 100-10 MG/5ML syrup Commonly known as:  ROBITUSSIN DM   insulin aspart 100 UNIT/ML injection Commonly known as:  novoLOG   methocarbamol 500 MG tablet Commonly known as:  ROBAXIN   ondansetron 4 MG/2ML Soln injection Commonly known as:  ZOFRAN     TAKE these medications   acetaminophen 325 MG tablet Commonly known as:  TYLENOL Take 1-2 tablets (325-650 mg total) by mouth every 6 (six) hours as needed for mild pain. What changed:    how much to take  reasons to take this   allopurinol 100 MG tablet Commonly known as:  ZYLOPRIM Take 1 tablet (100 mg total) by mouth daily.   amiodarone 200 MG tablet Commonly known as:  PACERONE Take 1 tablet (200 mg total) by mouth 2 (two) times daily.   aspirin 81 MG EC tablet Take 1 tablet (81 mg total) by mouth daily.   atorvastatin 80 MG tablet Commonly known as:  LIPITOR Take 1 tablet (80 mg total) by mouth daily.   hydrALAZINE 50 MG tablet Commonly known as:  APRESOLINE Take 1.5 tablets (75 mg total) by mouth every 8 (eight) hours. What changed:  medication strength   isosorbide mononitrate 30 MG 24 hr tablet Commonly known as:  IMDUR Take 1 tablet (30 mg total) by mouth daily.   metoprolol succinate 25 MG 24 hr tablet Commonly known as:  TOPROL-XL Take 3 tablets (75 mg total) by mouth daily.   omega-3 acid ethyl esters 1 g capsule Commonly known as:  LOVAZA Take 2 capsules (2 g total) by mouth 2 (two) times daily.   potassium chloride SA 20 MEQ tablet Commonly known as:  K-DUR,KLOR-CON Take 2 tablets (40 mEq total) by mouth daily.    rivaroxaban 20 MG Tabs tablet Commonly known as:  XARELTO Take 1 tablet (20 mg total) by mouth daily with supper.   torsemide 20 MG tablet Commonly known as:  DEMADEX Take 2 tablets (40 mg total) by mouth daily.      Follow-up Information    [facility]. Go on 03/13/2018.   Specialty:  Cardiology Why:  Advanced [facility] Failure Clinic at 3:00pm.  [place] code 1700 for October.  Please bring all medications with you to appt. Contact information: [address] [phone] mc LeRoy Hammond       [name], MD. Go on 03/15/2018.   Specialty:  Internal Medicine Why:  @ 1:30 PM Contact information: [address] [address] [phone]        [name],  MD .   Specialties:  Cardiology, Radiology Contact information: [address] [place] [address] [phone]        Meredith Staggers, MD Follow up.   Specialty:  Physical Medicine and Rehabilitation Why:  office will call you with follow up appointment Contact information: [address] [place] [address] [phone]           Signed: Bary Leriche 03/02/2018, 12:48 PM

## 2018-02-28 NOTE — Plan of Care (Signed)
  Problem: Spiritual Needs Goal: Ability to function at adequate level Outcome: Progressing   Problem: Consults Goal: RH GENERAL PATIENT EDUCATION Description See Patient Education module for education specifics. Outcome: Progressing Goal: Skin Care Protocol Initiated - if Braden Score 18 or less Description If consults are not indicated, leave blank or document N/A Outcome: Progressing Goal: Diabetes Guidelines if Diabetic/Glucose > 140 Description If diabetic or lab glucose is > 140 mg/dl - Initiate Diabetes/Hyperglycemia Guidelines & Document Interventions  Outcome: Progressing   Problem: RH BOWEL ELIMINATION Goal: RH STG MANAGE BOWEL WITH ASSISTANCE Description STG Manage Bowel with min Assistance.  Outcome: Progressing Goal: RH STG MANAGE BOWEL W/MEDICATION W/ASSISTANCE Description STG Manage Bowel with Medication with min Assistance.  Outcome: Progressing   Problem: RH SKIN INTEGRITY Goal: RH STG SKIN FREE OF INFECTION/BREAKDOWN Description Patients skin will remain free from further infection or breakdown with min assist.  Outcome: Progressing Goal: RH STG MAINTAIN SKIN INTEGRITY WITH ASSISTANCE Description STG Maintain Skin Integrity With min Assistance.  Outcome: Progressing   Problem: RH SAFETY Goal: RH STG ADHERE TO SAFETY PRECAUTIONS W/ASSISTANCE/DEVICE Description STG Adhere to Safety Precautions With mod I  Assistance/Device.  Outcome: Progressing   Problem: RH PAIN MANAGEMENT Goal: RH STG PAIN MANAGED AT OR BELOW PT'S PAIN GOAL Description < 4  Outcome: Progressing

## 2018-02-28 NOTE — Progress Notes (Signed)
Notified by assigned NT of EKG results indicating Acute MI SteMIi,-right ventricular involvement /acute inferior infarct, , patient assessed and denies chest pain, N/V, or discomfort VS 111/83, 110, 18, 97.9,O2 -2L -95%,On call Marlowe [place], PA notified,and on call Cardiologist notified by cardiology service,with no new orders at this time, Patient resting quietly in semi-fowlers position without distress or discomfort,, closely monitored. Call bell within reach informed to remain on bedrest until seen by MD

## 2018-02-28 NOTE — Progress Notes (Signed)
Physical Therapy Session Note  Patient Details  Name: Jay Chen MRN: [id number] Date of Birth: 10/06/1965  Today's Date: 02/28/2018 PT Individual Time: 0900-1000 PT Individual Time Calculation (min): 60 min   Short Term Goals: Week 1:  PT Short Term Goal 1 (Week 1): STG = LTG due to ELOS   Skilled Therapeutic Interventions/Progress Updates:    no c/o pain but does report some ongoing numbness/tingling in L hand.  Cleared by MD for participation in therapy.  O2 monitored throughout session, remained on room air throughout, O2 maintained above 90% except following 200' gait where it dropped to 75% but returned to >90% within 1 minute.    Pt demos bed mobility mod I, and transfers/gait throughout session with set up assist.  Gait to and from therapy gym with rollator, set up assist.  PT instructed pt in BLE therex with 4# ankle weights and level 3 theraband 2x15 reps LAQ, hip flexion, ball squeezes, and hip abduction.  Zoom ball 2 trials of 1 minute horizontally and 2 trials of 45 seconds vertically for shoulder and core strengthening and overall cardiovascular endurance/activity tolerance.  Side stepping x2 at rail and retro stepping x1 length of rail.  Returned to room at end of session.  Pt toileted mod I.  Returned to recliner and positioned with call bell in reach and needs met.   Therapy Documentation Precautions:  Precautions Precautions: Fall Restrictions Weight Bearing Restrictions: No   Therapy/Group: Individual Therapy  [name] 02/28/2018, 9:25 AM

## 2018-02-28 NOTE — Progress Notes (Signed)
Occupational Therapy Note  Patient Details  Name: AVROHOM MCKELVIN MRN: [id number] Date of Birth: [birth year]  Today's Date: 02/28/2018 OT Missed Time: 27 Minutes Missed Time Reason: Patient ill (comment)  EKG performed with results of acute MI and awaiting cardiology to see pt this morning. No medical hold but pt declines OT intervention at this time.    Darleen Crocker P 02/28/2018, 7:26 AM

## 2018-02-28 NOTE — Progress Notes (Signed)
Follow-up appointment scheduled with the [facility] on 03/13/18 at 3:00pm.

## 2018-02-28 NOTE — Progress Notes (Signed)
   Back in A fib. Will set up for cardioversion for tomorrow at 2:00.   Amy Clegg NP-C  10:04 AM

## 2018-02-28 NOTE — Progress Notes (Signed)
Occupational Therapy Session Note  Patient Details  Name: Jay Chen MRN: [id number] Date of Birth: 05-29-1965  Today's Date: 02/28/2018 OT Individual Time: 1430-1526 OT Individual Time Calculation (min): 56 min    Short Term Goals: Week 1:  OT Short Term Goal 1 (Week 1): STGs = LTGs  Skilled Therapeutic Interventions/Progress Updates:    Upon entering the room, pt seated in wheelchair with no c/o pain and agreeable to OT intervention. Pt and therapist discussing plans for discharge tomorrow, plans for session, and HHOT recommendations. Pt transferred into wheelchair and assisted outside for energy conservation. Pt ambulating 250' with RW for community mobility as he utilizes RW in community and rollator for home use. Pt ambulating with set up A to set up equipment and supervision for ambulating on uneven surfaces. Pt taking seated rest break and performing ambulating back to wheelchair in same manner. O2 remained above 94% on room air. Pt returning to unit in same manner and transferred to recliner chair with supervision for sit >stand transfer. Call bell and all needed items within reach upon exiting the room.   Therapy Documentation Precautions:  Precautions Precautions: Fall Restrictions Weight Bearing Restrictions: No Vital Signs: Therapy Vitals Pulse Rate: (!) 110 BP: 105/80 Patient Position (if appropriate): Sitting Oxygen Therapy SpO2: 96 % O2 Device: Room Air ADL: ADL Eating: Independent Grooming: Independent Upper Body Bathing: Independent Where Assessed-Upper Body Bathing: Shower Lower Body Bathing: Supervision/safety Where Assessed-Lower Body Bathing: Shower Upper Body Dressing: Setup Lower Body Dressing: Setup(min A for socks) Toileting: Supervision/safety Toilet Transfer: Close supervision Toilet Transfer Method: Magazine features editor: Close supervision Social research officer, government Method: Heritage manager: Shower seat with  back   Therapy/Group: Individual Therapy  Gypsy Decant 02/28/2018, 4:30 PM

## 2018-02-28 NOTE — Progress Notes (Signed)
Pt complained of some numbness and tingling on the left arm. Pam, PA made aware. No new orders. Continue plan of care.  Jay Chen W Tra Wilemon

## 2018-02-28 NOTE — Progress Notes (Signed)
Order clarification requested from, Lillia Mountain, NP about post cardioversion plan for tomorrow. NP made aware that there is NO telemetry on IP rehab floor. Continue plan of care.

## 2018-02-28 NOTE — Progress Notes (Signed)
Occupational Therapy Session Note  Patient Details  Name: Jay Chen MRN: [id number] Date of Birth: [birth year]  Today's Date: 02/28/2018 OT Individual Time: 1330-1400 OT Individual Time Calculation (min): 30 min    Short Term Goals: Week 1:  OT Short Term Goal 1 (Week 1): STGs = LTGs  Skilled Therapeutic Interventions/Progress Updates:    Treatment session with focus on strengthening, endurance, and energy conservation.  Pt received seated upright in recliner reporting plan for d/c tomorrow after procedure.  Educated on energy conservation strategies with self-care and home making tasks, provided pt with handouts.  Engaged in [place] with green theraband with pt completing 2 sets of 10 each exercise with min cues for technique.  Pt reports no questions or concerns about upcoming d/c.  Therapy Documentation Precautions:  Precautions Precautions: Fall Restrictions Weight Bearing Restrictions: No General:   Vital Signs: Therapy Vitals Pulse Rate: (!) 110 BP: 105/80 Patient Position (if appropriate): Sitting Oxygen Therapy SpO2: 96 % O2 Device: Room Air Pain:  Pt with no c/o pain   Therapy/Group: Individual Therapy  Simonne Come 02/28/2018, 3:32 PM

## 2018-02-28 NOTE — Progress Notes (Addendum)
Advanced Heart Failure Rounding Note  PCP: [name], MD Primary Cardiologist: Dr [name]  Subjective:    Continues to diurese on torsemide 40 mg daily. Weight up 2 lbs (bed weight again this am).   Creatinine trending up 1.93 > 2.02 > 2.11. SBP 110s  EKG today shows sinus tach vs aflutter 110s  Feels okay. Denies CP, SOB. Has not walked yet today. No palpitations  Objective:   Weight Range: (!) 155.7 kg Body mass index is 52.2 kg/m.   Vital Signs:   Temp:  [97.5 F (36.4 C)-98.8 F (37.1 C)] 97.9 F (36.6 C) (10/08 0456) Pulse Rate:  [85-113] 113 (10/08 0609) Resp:  [18-20] 18 (10/08 0609) BP: (99-129)/(81-90) 116/90 (10/08 0609) SpO2:  [91 %-95 %] 94 % (10/08 0609) FiO2 (%):  [21 %] 21 % (10/07 2104) Weight:  [154.7 kg-155.7 kg] 155.7 kg (10/08 0456) Last BM Date: 02/26/18  Weight change: Filed Weights   02/27/18 0500 02/27/18 1506 02/28/18 0456  Weight: (!) 154.6 kg (!) 154.7 kg (!) 155.7 kg    Intake/Output:   Intake/Output Summary (Last 24 hours) at 02/28/2018 0847 Last data filed at 02/28/2018 0826 Gross per 24 hour  Intake 1012 ml  Output 1250 ml  Net -238 ml      Physical Exam    General: Lying in bed. No resp difficulty. HEENT: Normal Neck: Supple. JVP ~7. Carotids 2+ bilat; no bruits. No thyromegaly or nodule noted. Cor: PMI nondisplaced. Tachy, regular with occ ectopy, No M/G/R noted Lungs: CTAB, normal effort. On 2 L Abdomen: Soft, non-tender, non-distended, no HSM. No bruits or masses. +BS  Extremities: No cyanosis, clubbing, or rash. R and LLE 1-2+ edema Neuro: Alert & orientedx3, cranial nerves grossly intact. moves all 4 extremities w/o difficulty. Affect pleasant  EKG: sinus tach vs atrial flutter 110s. Personally reviewed.   Labs    CBC Recent Labs    02/27/18 0729  WBC 6.1  HGB 13.6  HCT 43.9  MCV 100.2*  PLT 267   Basic Metabolic Panel Recent Labs    02/27/18 0729 02/28/18 0403  NA 138 141  K 3.9 3.7   CL 98 99  CO2 34* 33*  GLUCOSE 125* 119*  BUN 43* 43*  CREATININE 2.02* 2.11*  CALCIUM 8.6* 8.6*   Liver Function Tests No results for input(s): AST, ALT, ALKPHOS, BILITOT, PROT, ALBUMIN in the last 72 hours. No results for input(s): LIPASE, AMYLASE in the last 72 hours. Cardiac Enzymes No results for input(s): CKTOTAL, CKMB, CKMBINDEX, TROPONINI in the last 72 hours.  BNP: BNP (last 3 results) Recent Labs    12/27/17 2354 02/15/18 1559  BNP 283.3* 847.1*    ProBNP (last 3 results) No results for input(s): PROBNP in the last 8760 hours.   D-Dimer No results for input(s): DDIMER in the last 72 hours. Hemoglobin A1C No results for input(s): HGBA1C in the last 72 hours. Fasting Lipid Panel No results for input(s): CHOL, HDL, LDLCALC, TRIG, CHOLHDL, LDLDIRECT in the last 72 hours. Thyroid Function Tests No results for input(s): TSH, T4TOTAL, T3FREE, THYROIDAB in the last 72 hours.  Invalid input(s): FREET3  Other results:   Imaging    No results found.   Medications:     Scheduled Medications: . allopurinol  100 mg Oral Daily  . amiodarone  200 mg Oral BID  . aspirin EC  81 mg Oral Daily  . atorvastatin  80 mg Oral q1800  . feeding supplement (PRO-STAT SUGAR  FREE 64)  30 mL Oral BID  . hydrALAZINE  75 mg Oral Q8H  . insulin aspart  0-15 Units Subcutaneous TID WC  . insulin aspart  0-5 Units Subcutaneous QHS  . isosorbide mononitrate  30 mg Oral Daily  . metoprolol succinate  75 mg Oral Daily  . omega-3 acid ethyl esters  1 g Oral BID  . potassium chloride  40 mEq Oral Daily  . rivaroxaban  20 mg Oral Q supper  . torsemide  40 mg Oral Daily    Infusions:   PRN Medications: acetaminophen, alum & mag hydroxide-simeth, bisacodyl, diphenhydrAMINE, guaiFENesin-dextromethorphan, polyethylene glycol, prochlorperazine **OR** prochlorperazine **OR** prochlorperazine, sodium phosphate, traZODone    Patient Profile   Jay Chen is a 52 y.o. 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    Assessment/Plan   1. Acute on chronic combined CHF, NICM. No ICD. - Echo 12/2017 EF 20-25% - R/LHC normal cors. RA 17 PCWP 21 - Volume status okay.  - Continue 40 mg po torsemide daily for now. He has already received today. Monitor creatinine closely. - Continue Toprol 75  daily - Continue hydralazine 75 mg tid.  - Continue imdur 30 mg daily - No ARB/ARNi/spiro currently with CKD 3  2. AKI on CKD III - Baseline Cr 1.7 - 1.9 - Creatinine 1.93 > 2.02 >2.11 today. May be in setting of aflutter.   3. Persistent Afib/Aflutter - New diagnosis 08/2017, which was being treated with rate control.   -S/P DC-CV on 10/2.  - EKG looks like sinus tach vs aflutter.  - Continue amio to 200 mg twice a day. Decrease amiodarone to 200 mg daily on 03/03/2018  - Continue Toprol 75 mg daily.  - Continue Xarelto 20 mg daily   4. H/o CVA - Only remaining deficit.  - On Xarelto chronically. No change.     5. HTN - SBP 90-110s - Cr borderline for ARB/ARNi/Spiro.    6. AAA  - 4.7 cm by echo 12/2017  - Will need further imaging as creatinine permits. No change.    7. OSA - Non-compliant with CPAP. Discussed importance of CPAP. No change.   8. DM2 - Stable CBGs. Continue SSI. No change.  - Hgb A1C 7.1 01/24/18   9. Morbid obesity -Body mass index is 52.2 kg/m. - Needs significant weight loss. No change.   10. Severe Deconditioning.  CIR appreciated. Plans for DC possibly Wednesday.   Length of Stay: [place], NP  02/28/2018, 8:47 AM  Advanced Heart Failure Team Pager [phone] (M-F; 7a - 4p)  Please contact [facility] for night-coverage after hours (4p -7a ) and  weekends on [email or web address]  Patient seen and examined with the above-signed Advanced Practice Provider and/or Housestaff. I personally reviewed laboratory data, imaging studies and relevant notes. I independently examined the patient and formulated the important aspects of the plan. I have edited the note to reflect any of my changes or salient points. I have personally discussed the plan with the patient and/or family.  Overall doing well. Still with significant LE edema. On torsemide 40 daily but creatinine climbing slightly. Will continue current diuretic dose for now. Watch creatinine closely.  He is back in AFL. Would try to schedule DC-CV tomorrow if we are able to arrange with CIR. Continue amio and Xarelto.   Glori Bickers, MD  9:59 AM

## 2018-03-01 ENCOUNTER — Ambulatory Visit (HOSPITAL_COMMUNITY): Payer: BC Managed Care – PPO | Admitting: Certified Registered Nurse Anesthetist

## 2018-03-01 ENCOUNTER — Encounter (HOSPITAL_COMMUNITY)
Admission: RE | Disposition: A | Discharge status: Home / Self Care | Payer: Self-pay | Source: Ambulatory Visit | Attending: Internal Medicine

## 2018-03-01 ENCOUNTER — Ambulatory Visit (HOSPITAL_COMMUNITY)
Admission: RE | Admit: 2018-03-01 | Discharge: 2018-03-01 | Disposition: A | Discharge status: Home / Self Care | Payer: BC Managed Care – PPO | Source: Ambulatory Visit | Attending: Internal Medicine | Admitting: Internal Medicine

## 2018-03-01 ENCOUNTER — Encounter (HOSPITAL_COMMUNITY): Payer: Self-pay | Admitting: *Deleted

## 2018-03-01 ENCOUNTER — Inpatient Hospital Stay (HOSPITAL_COMMUNITY): Payer: BC Managed Care – PPO | Admitting: Occupational Therapy

## 2018-03-01 ENCOUNTER — Inpatient Hospital Stay (HOSPITAL_COMMUNITY): Payer: BC Managed Care – PPO | Admitting: Physical Therapy

## 2018-03-01 DIAGNOSIS — I4891 Unspecified atrial fibrillation: Secondary | ICD-10-CM

## 2018-03-01 DIAGNOSIS — N179 Acute kidney failure, unspecified: Secondary | ICD-10-CM | POA: Insufficient documentation

## 2018-03-01 DIAGNOSIS — I10 Essential (primary) hypertension: Secondary | ICD-10-CM

## 2018-03-01 DIAGNOSIS — I5023 Acute on chronic systolic (congestive) heart failure: Secondary | ICD-10-CM

## 2018-03-01 DIAGNOSIS — I5043 Acute on chronic combined systolic (congestive) and diastolic (congestive) heart failure: Secondary | ICD-10-CM | POA: Insufficient documentation

## 2018-03-01 DIAGNOSIS — Z7982 Long term (current) use of aspirin: Secondary | ICD-10-CM | POA: Insufficient documentation

## 2018-03-01 DIAGNOSIS — Z7901 Long term (current) use of anticoagulants: Secondary | ICD-10-CM | POA: Insufficient documentation

## 2018-03-01 DIAGNOSIS — I714 Abdominal aortic aneurysm, without rupture: Secondary | ICD-10-CM

## 2018-03-01 DIAGNOSIS — Z6841 Body Mass Index (BMI) 40.0 and over, adult: Secondary | ICD-10-CM | POA: Insufficient documentation

## 2018-03-01 DIAGNOSIS — I130 Hypertensive heart and chronic kidney disease with heart failure and stage 1 through stage 4 chronic kidney disease, or unspecified chronic kidney disease: Secondary | ICD-10-CM | POA: Insufficient documentation

## 2018-03-01 DIAGNOSIS — M109 Gout, unspecified: Secondary | ICD-10-CM | POA: Insufficient documentation

## 2018-03-01 DIAGNOSIS — I4819 Other persistent atrial fibrillation: Secondary | ICD-10-CM | POA: Insufficient documentation

## 2018-03-01 DIAGNOSIS — E1122 Type 2 diabetes mellitus with diabetic chronic kidney disease: Secondary | ICD-10-CM | POA: Insufficient documentation

## 2018-03-01 DIAGNOSIS — Z9049 Acquired absence of other specified parts of digestive tract: Secondary | ICD-10-CM | POA: Insufficient documentation

## 2018-03-01 DIAGNOSIS — Z8673 Personal history of transient ischemic attack (TIA), and cerebral infarction without residual deficits: Secondary | ICD-10-CM | POA: Insufficient documentation

## 2018-03-01 DIAGNOSIS — M1990 Unspecified osteoarthritis, unspecified site: Secondary | ICD-10-CM | POA: Insufficient documentation

## 2018-03-01 DIAGNOSIS — Z955 Presence of coronary angioplasty implant and graft: Secondary | ICD-10-CM | POA: Insufficient documentation

## 2018-03-01 DIAGNOSIS — I428 Other cardiomyopathies: Secondary | ICD-10-CM | POA: Insufficient documentation

## 2018-03-01 DIAGNOSIS — G4733 Obstructive sleep apnea (adult) (pediatric): Secondary | ICD-10-CM | POA: Insufficient documentation

## 2018-03-01 DIAGNOSIS — Z79899 Other long term (current) drug therapy: Secondary | ICD-10-CM | POA: Insufficient documentation

## 2018-03-01 DIAGNOSIS — Z87442 Personal history of urinary calculi: Secondary | ICD-10-CM | POA: Insufficient documentation

## 2018-03-01 DIAGNOSIS — I4892 Unspecified atrial flutter: Secondary | ICD-10-CM | POA: Diagnosis not present

## 2018-03-01 DIAGNOSIS — Z794 Long term (current) use of insulin: Secondary | ICD-10-CM | POA: Insufficient documentation

## 2018-03-01 DIAGNOSIS — E785 Hyperlipidemia, unspecified: Secondary | ICD-10-CM | POA: Insufficient documentation

## 2018-03-01 DIAGNOSIS — E119 Type 2 diabetes mellitus without complications: Secondary | ICD-10-CM

## 2018-03-01 DIAGNOSIS — N183 Chronic kidney disease, stage 3 (moderate): Secondary | ICD-10-CM | POA: Insufficient documentation

## 2018-03-01 HISTORY — PX: CARDIOVERSION: SHX1299

## 2018-03-01 LAB — BASIC METABOLIC PANEL
Anion gap: 7 (ref 5–15)
BUN: 46 mg/dL — ABNORMAL HIGH (ref 6–20)
CO2: 32 mmol/L (ref 22–32)
CREATININE: 2.12 mg/dL — AB (ref 0.61–1.24)
Calcium: 8.8 mg/dL — ABNORMAL LOW (ref 8.9–10.3)
Chloride: 100 mmol/L (ref 98–111)
GFR calc non Af Amer: 34 mL/min — ABNORMAL LOW (ref >60)
GFR, EST AFRICAN AMERICAN: 39 mL/min — AB (ref >60)
Glucose, Bld: 141 mg/dL — ABNORMAL HIGH (ref 70–99)
Potassium: 3.8 mmol/L (ref 3.5–5.1)
SODIUM: 139 mmol/L (ref 135–145)

## 2018-03-01 LAB — GLUCOSE, CAPILLARY: GLUCOSE-CAPILLARY: 141 mg/dL — AB (ref 70–99)

## 2018-03-01 SURGERY — CARDIOVERSION
Anesthesia: General

## 2018-03-01 MED ORDER — LIDOCAINE HCL (CARDIAC) PF 100 MG/5ML IV SOSY
PREFILLED_SYRINGE | INTRAVENOUS | Status: DC | PRN
  Administered 2018-03-01: 40 mg via INTRATRACHEAL

## 2018-03-01 MED ORDER — AMIODARONE HCL 200 MG PO TABS: 200.0000 mg | ORAL_TABLET | Freq: Two times a day (BID) | ORAL | 0 refills | Status: DC

## 2018-03-01 MED ORDER — ISOSORBIDE MONONITRATE ER 30 MG PO TB24: 30.0000 mg | ORAL_TABLET | Freq: Every day | ORAL | 0 refills | Status: DC

## 2018-03-01 MED ORDER — SODIUM CHLORIDE 0.9 % IV SOLN
INTRAVENOUS | Status: DC
  Administered 2018-03-01: 14:00:00 via INTRAVENOUS

## 2018-03-01 MED ORDER — METOPROLOL SUCCINATE ER 25 MG PO TB24: 75.0000 mg | ORAL_TABLET | Freq: Every day | ORAL | 0 refills | Status: DC

## 2018-03-01 MED ORDER — POTASSIUM CHLORIDE CRYS ER 20 MEQ PO TBCR: 40.0000 meq | EXTENDED_RELEASE_TABLET | Freq: Every day | ORAL | 1 refills | Status: DC

## 2018-03-01 MED ORDER — TORSEMIDE 20 MG PO TABS: 40.0000 mg | ORAL_TABLET | Freq: Every day | ORAL | 0 refills | Status: DC

## 2018-03-01 MED ORDER — HYDRALAZINE HCL 50 MG PO TABS: 75.0000 mg | ORAL_TABLET | Freq: Three times a day (TID) | ORAL | 0 refills | Status: DC

## 2018-03-01 MED ORDER — ACETAMINOPHEN 325 MG PO TABS: 325.0000 mg | ORAL_TABLET | Freq: Four times a day (QID) | ORAL | Status: AC | PRN

## 2018-03-01 MED ORDER — HYDRALAZINE HCL 25 MG PO TABS: 75.0000 mg | ORAL_TABLET | Freq: Three times a day (TID) | ORAL | 0 refills | Status: DC

## 2018-03-01 MED ORDER — ETOMIDATE 2 MG/ML IV SOLN
INTRAVENOUS | Status: DC | PRN
  Administered 2018-03-01: 20 mg via INTRAVENOUS

## 2018-03-01 NOTE — Progress Notes (Signed)
Patient discharged with family. All questions answered and belongings accounted for.

## 2018-03-01 NOTE — Progress Notes (Signed)
Occupational Therapy Discharge Summary  Patient Details  Name: Jay Chen MRN: [id number] Date of Birth: Sep 17, 1965  Today's Date: 03/01/2018 OT Individual Time: 9201-0071 OT Individual Time Calculation (min): 54 min    Patient has met 9 of 9 long term goals due to improved activity tolerance, improved balance, ability to compensate for deficits, improved attention and improved coordination.  Patient to discharge at overall Modified Independent level.    Reasons goals not met: all goals met  Recommendation:  Patient will benefit from ongoing skilled OT services in home health setting to continue to advance functional skills in the area of BADL and iADL.  Equipment: No equipment provided  Reasons for discharge: treatment goals met  Patient/family agrees with progress made and goals achieved: Yes   OT Intervention: Upon entering the room, pt sleeping soundly in bed. Pt with no c/o pain and agreeable to OT intervention. Pt utilized rollator in room for ambulating to obtain needed items for dressing and bathing. Pt ambulating into bathroom for shower transfer at mod I level and seated on TTB for bathing. Pt donning clothing LB clothing with sit >stand from TTB. Pt standing at sink for grooming tasks and to don pull over shirt at mod I level. OT recommended follow up with HHOT at discharge and pt in agreement. Pt made Mod I in room and RN notified. No further questions or concerns at this time. All needed items within reach upon exiting the room.   OT Discharge Precautions/Restrictions  Precautions Precautions: Fall Restrictions Weight Bearing Restrictions: No Vital Signs Therapy Vitals Temp: 97.7 F (36.5 C) Temp Source: Oral Pulse Rate: (!) 110(RN Notifed) Resp: 19 BP: (!) 107/91(RN Notifed) Patient Position (if appropriate): Lying Oxygen Therapy SpO2: 93 % O2 Device: Room Air Pain Pain Assessment Pain Scale: 0-10 Pain Score: 0-No pain ADL ADL Eating:  Independent Grooming: Independent Upper Body Bathing: Independent Where Assessed-Upper Body Bathing: Shower Lower Body Bathing: Supervision/safety Where Assessed-Lower Body Bathing: Shower Upper Body Dressing: Setup Lower Body Dressing: Setup(min A for socks) Toileting: Supervision/safety Toilet Transfer: Close supervision Toilet Transfer Method: Magazine features editor: Close supervision Social research officer, government Method: Heritage manager: Civil engineer, contracting with back Vision Baseline Vision/History: Wears glasses Wears Glasses: Reading only Patient Visual Report: No change from baseline Cognition Overall Cognitive Status: Within Functional Limits for tasks assessed Arousal/Alertness: Awake/alert Orientation Level: Oriented X4 Sensation Sensation Light Touch: Appears Intact Hot/Cold: Appears Intact Proprioception: Appears Intact Stereognosis: Appears Intact Coordination Gross Motor Movements are Fluid and Coordinated: Yes Fine Motor Movements are Fluid and Coordinated: Yes Motor  Motor Motor - Discharge Observations: generalized weakness but improved endurance from time of evaluation Mobility  Bed Mobility Bed Mobility: Rolling Right;Rolling Left;Sit to Supine;Supine to Sit Rolling Right: Independent Rolling Left: Independent Supine to Sit: Independent Sit to Supine: Independent Transfers Sit to Stand: Independent with assistive device Stand to Sit: Independent with assistive device  Trunk/Postural Assessment  Cervical Assessment Cervical Assessment: Within Functional Limits Thoracic Assessment Thoracic Assessment: Within Functional Limits Lumbar Assessment Lumbar Assessment: Within Functional Limits Postural Control Postural Control: Within Functional Limits  Balance Balance Balance Assessed: Yes Static Sitting Balance Static Sitting - Balance Support: Feet supported;No upper extremity supported Static Sitting - Level of Assistance: 7:  Independent Dynamic Sitting Balance Dynamic Sitting - Balance Support: Feet supported;No upper extremity supported Dynamic Sitting - Level of Assistance: 7: Independent Static Standing Balance Static Standing - Balance Support: Bilateral upper extremity supported;During functional activity Static Standing - Level of Assistance: 7: Independent  Dynamic Standing Balance Dynamic Standing - Balance Support: Bilateral upper extremity supported;During functional activity Dynamic Standing - Level of Assistance: 7: Independent Extremity/Trunk Assessment RUE Assessment RUE Assessment: Within Functional Limits LUE Assessment LUE Assessment: Within Functional Limits   Gypsy Decant 03/01/2018, 7:49 AM

## 2018-03-01 NOTE — Progress Notes (Signed)
[facility] [place] PHYSICAL MEDICINE & REHABILITATION PROGRESS NOTE   Subjective/Complaints: Up in bathroom this morning. No new complaints.   ROS: Patient denies fever, rash, sore throat, blurred vision, nausea, vomiting, diarrhea, cough, shortness of breath or chest pain, joint or back pain, headache, or mood change.   Objective:   No results found. Recent Labs    02/27/18 0729  WBC 6.1  HGB 13.6  HCT 43.9  PLT 350   Recent Labs    02/28/18 0403 03/01/18 0613  NA 141 139  K 3.7 3.8  CL 99 100  CO2 33* 32  GLUCOSE 119* 141*  BUN 43* 46*  CREATININE 2.11* 2.12*  CALCIUM 8.6* 8.8*    Intake/Output Summary (Last 24 hours) at 03/01/2018 0826 Last data filed at 03/01/2018 0418 Gross per 24 hour  Intake 924 ml  Output 1025 ml  Net -101 ml     Physical Exam: Vital Signs Blood pressure (!) 107/91, pulse (!) 110, temperature 97.7 F (36.5 C), temperature source Oral, resp. rate 19, height 5\' 8"  (1.727 m), weight (!) 155.6 kg, SpO2 93 %.  General: No acute distress HEENT: EOMI, oral membranes moist Cards: reg rate  Chest: normal effort Abdomen: Soft, NT, ND Skin: dry, intact     Musc: Lower extremity edema 1-2+ Neurological: He isalertand oriented to person, place, and time. Reasonable insight Motor: 4/5 UE bilaterally, stable.  Bilateral LE: 3+/5 prox to 4/5 distally, stable.  Skin: blister on right shin, wound with foam dressing Psych: pleasant  Assessment/Plan: 1. Functional deficits secondary to debility which require 3+ hours per day of interdisciplinary therapy in a comprehensive inpatient rehab setting.  Physiatrist is providing close team supervision and 24 hour management of active medical problems listed below.  Physiatrist and rehab team continue to assess barriers to discharge/monitor patient progress toward functional and medical goals  Care Tool:  Bathing  Bathing activity did not occur: Refused Body parts bathed by patient: Right arm, Left arm,  Chest, Abdomen, Front perineal area, Buttocks, Right upper leg, Left lower leg, Right lower leg, Left upper leg, Face     Body parts n/a: Buttocks   Bathing assist Assist Level: Independent     Upper Body Dressing/Undressing Upper body dressing   What is the patient wearing?: Pull over shirt    Upper body assist Assist Level: Independent    Lower Body Dressing/Undressing Lower body dressing      What is the patient wearing?: Pants, Underwear/pull up     Lower body assist Assist for lower body dressing: Independent     Toileting Toileting    Toileting assist Assist for toileting: Independent with assistive device Assistive Device Comment: rollator   Transfers Chair/bed transfer  Transfers assist     Chair/bed transfer assist level: Independent with assistive device Chair/bed transfer assistive device: Other(rollator)   Locomotion Ambulation   Ambulation assist      Assist level: Set up assist Assistive device: Rollator Max distance: 250'   Walk 10 feet activity   Assist     Assist level: Set up assist Assistive device: Rollator   Walk 50 feet activity   Assist    Assist level: Set up assist Assistive device: Rollator    Walk 150 feet activity   Assist    Assist level: Set up assist Assistive device: Rollator    Walk 10 feet on uneven surface  activity   Assist     Assist level: Set up assist Assistive device: Rollator   Wheelchair  Assist Will patient use wheelchair at discharge?: No             Wheelchair 50 feet with 2 turns activity    Assist            Wheelchair 150 feet activity     Assist            Medical Problem List and Plan: 1.Decreased functional mobilitysecondary to cervical stenosis with cord compression, lumbar spondylosis with radiculopathy, left temporal lobe infarction.Patient is to follow-up outpatient with Dr. Kathyrn Sheriff to discuss possible surgical interventions for  lumbar radiculopathy     -dc home today. DC cardioversion post discharge   -pt may keep scheduled office follow up with me 2. DVT Prophylaxis/Anticoagulation: Xarelto. 3. Pain Management:Robaxin 500 mg 3 times daily, oxycodone as needed 4. Mood:Provide emotional support 5. Neuropsych: This patientiscapable of making decisions on hisown behalf. 6. Skin/Wound Care:Routine skin checks 7. Fluids/Electrolytes/Nutrition: 8. Atrial fibrillation. HR sl elevated this morning. Amiodarone 200 mg twice daily,Toprol 100mg  daily  -for DC cardioversion today 9.Chronic systolic and diastolic congestive heart failure. Monitor for any signs of fluid overload. Lasix 40 mg Daily  -daily weights   Filed Weights   02/27/18 1506 02/28/18 0456 03/01/18 0416  Weight: (!) 154.7 kg (!) 155.7 kg (!) 155.6 kg   Weights stable around 155kg  -cardiology followup as outpt    10. LLLpneumonia.   Completed course of Omnicef and doxycycline  -wean oxygen 11. Diabetes mellitus with peripheral neuropathy. Hemoglobin A1c 8.4. SSI. Check blood sugars before meals and at bedtime. Patient on Glucophage 1000 mg twice daily prior to admission currently on hold due to renal insufficiency  Controlled on 10/9 12.CKD stage III.   Creatinine sl increased to 2.11 but has remained in the 1.8-2.1 range     Continue to check serial labs as outpt 13.Hyperlipidemia. Lovaza/Lipitor 14.Morbid obesity. BMI 51.99. review appropriate dietary intake, weight loss 15.Constipation. Laxative assistance 16.  Hypoalbuminemia  Supplement initiated 17.  Acute blood loss anemia  Hemoglobin 12.7 on 10/4  Continue to monitor 18.  Elevated diastolic blood pressures  Persistent. Defer to cardiology   LOS: 6 days A FACE TO [place] 03/01/2018, 8:26 AM

## 2018-03-01 NOTE — Interval H&P Note (Signed)
History and Physical Interval Note:  03/01/2018 2:12 PM  Jay Chen  has presented today for surgery, with the diagnosis of atrial flutter  The various methods of treatment have been discussed with the patient and family. After consideration of risks, benefits and other options for treatment, the patient has consented to  Procedure(s): CARDIOVERSION (N/A) as a surgical intervention .  The patient's history has been reviewed, patient examined, no change in status, stable for surgery.  I have reviewed the patient's chart and labs.  Questions were answered to the patient's satisfaction.     Daniel Bensimhon

## 2018-03-01 NOTE — H&P (View-Only) (Signed)
Advanced Heart Failure Rounding Note  PCP: [name], MD Primary Cardiologist: Dr [name]  Subjective:    Continues to diurese on torsemide 40 mg daily. Weight unchanged.  Creatinine has plateaued 1.93 > 2.02 > 2.11 > 2.12. SBP 100s.  EKG shows atrial flutter 113.  Walking in hallways. No CP or SOB. No missed doses of Xarelto.  Objective:   Weight Range: (!) 155.6 kg Body mass index is 52.17 kg/m.   Vital Signs:   Temp:  [97.6 F (36.4 C)-98.2 F (36.8 C)] 97.7 F (36.5 C) (10/09 0416) Pulse Rate:  [64-110] 110 (10/09 0416) Resp:  [16-19] 19 (10/09 0416) BP: (105-114)/(80-91) 107/91 (10/09 0416) SpO2:  [93 %-96 %] 93 % (10/09 0416) Weight:  [155.6 kg] 155.6 kg (10/09 0416) Last BM Date: 02/27/18  Weight change: Filed Weights   02/27/18 1506 02/28/18 0456 03/01/18 0416  Weight: (!) 154.7 kg (!) 155.7 kg (!) 155.6 kg    Intake/Output:   Intake/Output Summary (Last 24 hours) at 03/01/2018 0840 Last data filed at 03/01/2018 0418 Gross per 24 hour  Intake 924 ml  Output 1025 ml  Net -101 ml      Physical Exam    General:  No resp difficulty. HEENT: Normal Neck: Supple. JVP 7-8. Carotids 2+ bilat; no bruits. No thyromegaly or nodule noted. Cor: PMI nondisplaced. Tachy, irregular, No M/G/R noted Lungs: CTAB, normal effort. On RA Abdomen: Obese Soft, non-tender, non-distended, no HSM. No bruits or masses. +BS  Extremities: No cyanosis, clubbing, or rash. R and LLE 1-2+ edema. Neuro: Alert & orientedx3, cranial nerves grossly intact. moves all 4 extremities w/o difficulty. Affect pleasant  EKG: atrial flutter 113. Personally reviewed.   Labs    CBC Recent Labs    02/27/18 0729  WBC 6.1  HGB 13.6  HCT 43.9  MCV 100.2*  PLT 315   Basic Metabolic Panel Recent Labs    02/28/18 0403 03/01/18 0613  NA 141 139  K 3.7 3.8  CL 99 100  CO2 33* 32  GLUCOSE 119* 141*  BUN 43* 46*  CREATININE 2.11* 2.12*  CALCIUM 8.6* 8.8*   Liver  Function Tests No results for input(s): AST, ALT, ALKPHOS, BILITOT, PROT, ALBUMIN in the last 72 hours. No results for input(s): LIPASE, AMYLASE in the last 72 hours. Cardiac Enzymes No results for input(s): CKTOTAL, CKMB, CKMBINDEX, TROPONINI in the last 72 hours.  BNP: BNP (last 3 results) Recent Labs    12/27/17 2354 02/15/18 1559  BNP 283.3* 847.1*    ProBNP (last 3 results) No results for input(s): PROBNP in the last 8760 hours.   D-Dimer No results for input(s): DDIMER in the last 72 hours. Hemoglobin A1C No results for input(s): HGBA1C in the last 72 hours. Fasting Lipid Panel No results for input(s): CHOL, HDL, LDLCALC, TRIG, CHOLHDL, LDLDIRECT in the last 72 hours. Thyroid Function Tests No results for input(s): TSH, T4TOTAL, T3FREE, THYROIDAB in the last 72 hours.  Invalid input(s): FREET3  Other results:   Imaging    No results found.   Medications:     Scheduled Medications: . allopurinol  100 mg Oral Daily  . amiodarone  200 mg Oral BID  . aspirin EC  81 mg Oral Daily  . atorvastatin  80 mg Oral q1800  . feeding supplement (PRO-STAT SUGAR FREE 64)  30 mL Oral BID  . hydrALAZINE  75 mg Oral Q8H  . insulin aspart  0-15 Units Subcutaneous TID WC  .  insulin aspart  0-5 Units Subcutaneous QHS  . isosorbide mononitrate  30 mg Oral Daily  . metoprolol succinate  75 mg Oral Daily  . omega-3 acid ethyl esters  1 g Oral BID  . potassium chloride  40 mEq Oral Daily  . rivaroxaban  20 mg Oral Q supper  . torsemide  40 mg Oral Daily    Infusions:   PRN Medications: acetaminophen, alum & mag hydroxide-simeth, bisacodyl, diphenhydrAMINE, guaiFENesin-dextromethorphan, polyethylene glycol, prochlorperazine **OR** prochlorperazine **OR** prochlorperazine, sodium phosphate, traZODone    Patient Profile   Jay Chen is a 52 y.o. 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    Assessment/Plan   1. Acute on chronic combined CHF, NICM. No ICD. - Echo 12/2017 EF 20-25% - R/LHC normal cors. RA 17 PCWP 21 - Volume status okay - Continue 40 mg po torsemide daily for now.  - Continue Toprol 75  daily - Continue hydralazine 75 mg tid.  - Continue imdur 30 mg daily - No ARB/ARNi/spiro currently with CKD 3  2. AKI on CKD III - Baseline Cr 1.7 - 1.9 - Creatinine 1.93 > 2.02 >2.11>2.12 today. May be in setting of aflutter.   3. Persistent Afib/Aflutter - New diagnosis 08/2017, which was being treated with rate control.   -S/P DC-CV on 10/2.  - EKG today shows aflutter. - Continue amio to 200 mg twice a day. Plan to decrease amiodarone to 200 mg daily on 03/03/2018. Will confirm this with MD since he is back in flutter. - Continue Toprol 75 mg daily.  - Continue Xarelto 20 mg daily - Going for outpatient DCCV today at 2 pm.     4. H/o CVA - Only remaining deficit.  - On Xarelto chronically. No change.    5. HTN - SBP 100-110s - Cr borderline for ARB/ARNi/Spiro.    6. AAA  - 4.7 cm by echo 12/2017  - Will need further imaging as creatinine permits. No change.   7. OSA - Refuses CPAP. Discussed importance of CPAP. No change.  8. DM2 - Stable CBGs. Continue SSI. No change. - Hgb A1C 7.1 01/24/18   9. Morbid obesity -Body mass index is 52.17 kg/m. - Needs significant weight loss.   10. Severe Deconditioning.  CIR appreciated. Plans for DC today  Planning for DC today and then DCCV at 2 pm. He will go home with [facility]. Please check BMET in 1 week through [facility]. Follow up has been scheduled in HF clinic for 10/21 at 3 pm.   Heart failure team will sign off as of 03/01/18  HF Medication Recommendations for Home: Amiodarone 200 mg BID (do not decrease on 10/11  as previously planned) ASA 81 mg daily Atorvastatin 80 mg daily Hydralazine 75 mg TID Imdur 30 mg daily Toprol XL 75 mg daily Potassium 40 meq daily Xarelto 20 mg daily Torsemide 40 mg daily  Other recommendations (Labs,testing, etc): BMET through [facility] in 1 week. EKG at follow up.   Follow up as an outpatient: 10/21 3 pm in HF clinic (placed on AVS)   Length of Stay: [place]a [place], NP  03/01/2018, 8:40 AM  Advanced Heart Failure Team Pager [phone] (M-F;  7a - 4p)  Please contact [facility] for night-coverage after hours (4p -7a ) and weekends on [email or web address]  Patient seen and examined with the above-signed Advanced Practice Provider and/or Housestaff. I personally reviewed laboratory data, imaging studies and relevant notes. I independently examined the patient and formulated the important aspects of the plan. I have edited the note to reflect any of my changes or salient points. I have personally discussed the plan with the patient and/or family.  He feels ok. Volume status improved but still slightly elevated. Will be discharged from CIR today. Will do outpatient DC-CV for recurrent AF/AFl this afternoon. Continue amio 200 bid and Xarelto.   Glori Bickers, MD  11:04 AM

## 2018-03-01 NOTE — Plan of Care (Signed)
  Problem: RH Floor Transfers Goal: LTG Patient will perform floor transfers w/assist (PT) Description LTG: Patient will perform floor transfers with assistance (PT). Outcome: Not Met (add Reason) Note:  Did not attempt due to short LOS

## 2018-03-01 NOTE — CV Procedure (Signed)
    DIRECT CURRENT CARDIOVERSION  NAME:  Jay Chen   MRN: [id number] DOB:  1966-05-15   ADMIT DATE: 03/01/2018   INDICATIONS: Atrial flutter   PROCEDURE:   Informed consent was obtained prior to the procedure. The risks, benefits and alternatives for the procedure were discussed and the patient comprehended these risks. Once an appropriate time out was taken, the patient had the defibrillator pads placed in the anterior and posterior position. The patient then underwent sedation by the anesthesia service. Once an appropriate level of sedation was achieved, the patient received a single biphasic, synchronized 200J shock with manual pressure held on the anterior pad with prompt conversion to sinus rhythm. No apparent complications.  Glori Bickers, MD  2:42 PM

## 2018-03-01 NOTE — Progress Notes (Signed)
Social Work Discharge Note  The overall goal for the admission was met for:   Discharge location: Yes - home (after cardioversion)  Length of Stay: Yes - 6 days  Discharge activity level: Yes - modified independent  Home/community participation: Yes  Services provided included: MD, RD, PT, OT, RN, Pharmacy and SW  Financial Services: Private Insurance: [employer] [place]  Follow-up services arranged: Home Health: PT/OT/RN and Patient/Family request agency HH: [facility] (was still active PTA), DME: Pt has all recommended DME from last CIR stay in august 2019.  Comments (or additional information): Pt's family came to get pt from CIR at discharge and will take pt to endoscopy as an outpt for a cardioversion.  Pt communicated plan to his family.  Patient/Family verbalized understanding of follow-up arrangements: Yes  Individual responsible for coordination of the follow-up plan: pt, pt's wife, and pt's mother  Confirmed correct DME delivered: Trey Sailors 03/01/2018    Jacqulin Brandenburger, Silvestre Mesi

## 2018-03-01 NOTE — Anesthesia Preprocedure Evaluation (Addendum)
Anesthesia Evaluation  Patient identified by MRN, date of birth, ID band Patient awake    Reviewed: Allergy & Precautions, NPO status , Patient's Chart, lab work & pertinent test results, reviewed documented beta blocker date and time   History of Anesthesia Complications Negative for: history of anesthetic complications  Airway Mallampati: I  TM Distance: >3 FB Neck ROM: Full    Dental  (+) Dental Advisory Given, Poor Dentition, Chipped   Pulmonary sleep apnea (doesn't tolerate CPAP) ,    breath sounds clear to auscultation       Cardiovascular hypertension, Pt. on medications and Pt. on home beta blockers (-) angina+ Peripheral Vascular Disease (thoracic aortic aneurysm 4.7cm)  + dysrhythmias Atrial Fibrillation  Rhythm:Irregular Rate:Normal  02/20/18 cath: Normal coronaries, Severe NICM EF 20-25% by echo   Neuro/Psych negative neurological ROS     GI/Hepatic negative GI ROS, Neg liver ROS,   Endo/Other  diabetes (glu 149)Morbid obesity  Renal/GU Renal InsufficiencyRenal disease     Musculoskeletal  (+) Arthritis , Osteoarthritis,    Abdominal (+) + obese,   Peds  Hematology Xarelto   Anesthesia Other Findings   Reproductive/Obstetrics                            Anesthesia Physical Anesthesia Plan  ASA: III  Anesthesia Plan: General   Post-op Pain Management:    Induction: Intravenous  PONV Risk Score and Plan: 2 and Treatment may vary due to age or medical condition  Airway Management Planned: Natural Airway and Mask  Additional Equipment:   Intra-op Plan:   Post-operative Plan:   Informed Consent: I have reviewed the patients History and Physical, chart, labs and discussed the procedure including the risks, benefits and alternatives for the proposed anesthesia with the patient or authorized representative who has indicated his/her understanding and acceptance.   Dental  advisory given  Plan Discussed with: CRNA and Surgeon  Anesthesia Plan Comments: (Plan routine monitors, GA for cardioversion)        Anesthesia Quick Evaluation

## 2018-03-01 NOTE — Anesthesia Postprocedure Evaluation (Signed)
Anesthesia Post Note  Patient: Jay Chen  Procedure(s) Performed: CARDIOVERSION (N/A )     Patient location during evaluation: Endoscopy Anesthesia Type: General Level of consciousness: oriented, awake and alert and patient cooperative Pain management: pain level controlled Vital Signs Assessment: post-procedure vital signs reviewed and stable Respiratory status: spontaneous breathing, nonlabored ventilation and respiratory function stable Cardiovascular status: blood pressure returned to baseline and stable Postop Assessment: no apparent nausea or vomiting Anesthetic complications: no    Last Vitals:  Vitals:   03/01/18 1220 03/01/18 1444  BP: 99/66 (!) 158/92  Pulse: 87   Resp: 15 18  Temp: 37.2 C 36.4 C  SpO2: 92% 95%    Last Pain:  Vitals:   03/01/18 1444  TempSrc: Oral  PainSc: 0-No pain                 Jobin Montelongo,E. Cassidy Tabet

## 2018-03-01 NOTE — Transfer of Care (Signed)
Immediate Anesthesia Transfer of Care Note  Patient: Jay Chen  Procedure(s) Performed: CARDIOVERSION (N/A )  Patient Location: PACU and Endoscopy Unit  Anesthesia Type:General  Level of Consciousness: awake, patient cooperative and responds to stimulation  Airway & Oxygen Therapy: Patient Spontanous Breathing  Post-op Assessment: Report given to RN and Post -op Vital signs reviewed and stable  Post vital signs: Reviewed and stable  Last Vitals:  Vitals Value Taken Time  BP    Temp    Pulse    Resp    SpO2      Last Pain:  Vitals:   03/01/18 1220  TempSrc: Oral  PainSc: 0-No pain         Complications: No apparent anesthesia complications

## 2018-03-01 NOTE — Patient Care Conference (Signed)
Inpatient RehabilitationTeam Conference and Plan of Care Update Date: 02/28/2018   Time: 2:00 PM    Patient Name: Jay Chen      Medical Record Number: [id number]  Date of Birth: December 04, 1965 Sex: Male         Room/Bed: 4W03C/4W03C-01 Payor Info: Payor: [place] / Plan: [place] PPO / Product Type: *No Product type* /    Admitting Diagnosis: Debility  Admit Date/Time:  02/23/2018  5:43 PM Admission Comments: No comment available   Primary Diagnosis:  Debility Principal Problem: Debility  Patient Active Problem List   Diagnosis Date Noted  . Acute blood loss anemia   . Hypoalbuminemia due to protein-calorie malnutrition ([place])   . CKD (chronic kidney disease), stage III ([place])   . Chronic combined systolic and diastolic congestive heart failure ([place])   . PAF (paroxysmal atrial fibrillation) ([place])   . Debility 02/23/2018  . CHF (congestive heart failure), NYHA class III ([place]) 02/15/2018  . CKD (chronic kidney disease) stage 4, GFR 15-29 ml/min (H[facility])   . Left temporal lobe infarction ([place])   . Morbid obesity ([place])   . Diabetes mellitus with neuropathy ([place])   . Atrial fibrillation, chronic   . Cerebral embolism with cerebral infarction 01/01/2018  . Lumbar radiculopathy   . Cervical spinal cord compression ([place])   . DCM (dilated cardiomyopathy) ([place])   . Atrial fibrillation with rapid ventricular response ([place])   . Chronic combined systolic (congestive) and diastolic (congestive) heart failure ([place])   . Acute on chronic systolic heart failure ([place]) 11/22/2017  . Bilateral edema of lower extremity 10/21/2017  . Pure hyperglyceridemia 09/01/2017  . Phimosis/redundant prepuce 06/03/2015  . Gout of big toe 09/11/2012  . Hyperlipidemia with target LDL less than 100 09/11/2012  . Routine general medical examination at a health care facility 01/19/2012  . Cor pulmonale ([place]) 01/08/2011  . HTN (hypertension) 01/08/2011  . Obesities, morbid ([place]) 01/08/2011  .  Aneurysm of thoracic aorta ([place]) 01/08/2011  . Obstructive sleep apnea 08/27/2007  . RHINITIS 08/18/2007    Expected Discharge Date: Expected Discharge Date: 03/01/18  Team Members Present: Physician leading conference: Dr. Alger Simons Social Worker Present: Alfonse Alpers, LCSW Nurse Present: Other (comment)(Mekides, RN) PT Present: Dwyane Dee, PT OT Present: Benay Pillow, OT PPS Coordinator present : Daiva Nakayama, RN, CRRN     Current Status/Progress Goal Weekly Team Focus  Medical   readmitted for chf, debility, dc cardioversion tomorrow  prepare medically for tomorrow  volume mgt, bpcontrol   Bowel/Bladder   Continent of Bladder and Vowel, LBM 02/26/18  Remain continent  of bladder and bowel with min assist  Assess and assist with toileting needs, address bowel and bladder function QS and prn    Swallow/Nutrition/ Hydration             ADL's   Supervision for ambulatory transfers using RW or rollator. Supervision bathing at shower level using AE. Min A LB dressing. Setup UB dressing.   Mod I overall   Balance, functional transfers, general strengthening + endurance    Mobility   up to 300' with rollator, approaching mod I, d/c following cardioversion tomorrow  mod I   activity tolerance, d/c planning    Communication             Safety/Cognition/ Behavioral Observations            Pain   Denies c/o pain   <2 out of 10  Assess  pain qs  and prn,administer medications prn, assess the effectiveness, notify MD/PA    Skin   Resolving ecchymotic areas to upper extremities and abdomen,   Maintain skin integrity free from infection/breakdown   Assess Skin QS and prn , address any clinical changes with medical team and provided treatment    Rehab Goals Patient on target to meet rehab goals: Yes Rehab Goals Revised: none *See Care Plan and progress notes for long and short-term goals.     Barriers to Discharge  Current Status/Progress Possible Resolutions Date  Resolved   Physician             n/a      Nursing                  PT                    OT Weight                SLP                SW                Discharge Planning/Teaching Needs:  Pt to return to his home where either his wife or mother will provide supervision.  Pt reaching mod I goals and family was educated at last month's d/c from [facility].   Team Discussion:  Pt with Afib, but otherwise okay medically.  Pt is mod I with therapy and sats go down with exertion, but come right back to the 90s with rest.  RN to wean pt off O2 at night.  Pt has met therapy goals, so he will have grad day on 03-01-18, be discharged, and then his family will take him to endoscopy for cardioversion.    Revisions to Treatment Plan:   none    Continued Need for Acute Rehabilitation Level of Care: The patient requires daily medical management by a physician with specialized training in physical medicine and rehabilitation for the following conditions: Daily direction of a multidisciplinary physical rehabilitation program to ensure safe treatment while eliciting the highest outcome that is of practical value to the patient.: Yes Daily medical management of patient stability for increased activity during participation in an intensive rehabilitation regime.: Yes Daily analysis of laboratory values and/or radiology reports with any subsequent need for medication adjustment of medical intervention for : Cardiac problems   I attest that I was present, lead the team conference, and concur with the assessment and plan of the team.   Romelle Reiley, Silvestre Mesi 03/01/2018, 12:46 PM

## 2018-03-01 NOTE — Discharge Instructions (Signed)
Electrical Cardioversion, Care After °This sheet gives you information about how to care for yourself after your procedure. Your health care provider may also give you more specific instructions. If you have problems or questions, contact your health care provider. °What can I expect after the procedure? °After the procedure, it is common to have: °· Some redness on the skin where the shocks were given. ° °Follow these instructions at home: °· Do not drive for 24 hours if you were given a medicine to help you relax (sedative). °· Take over-the-counter and prescription medicines only as told by your health care provider. °· Ask your health care provider how to check your pulse. Check it often. °· Rest for 48 hours after the procedure or as told by your health care provider. °· Avoid or limit your caffeine use as told by your health care provider. °Contact a health care provider if: °· You feel like your heart is beating too quickly or your pulse is not regular. °· You have a serious muscle cramp that does not go away. °Get help right away if: °· You have discomfort in your chest. °· You are dizzy or you feel faint. °· You have trouble breathing or you are short of breath. °· Your speech is slurred. °· You have trouble moving an arm or leg on one side of your body. °· Your fingers or toes turn cold or blue. °This information is not intended to replace advice given to you by your health care provider. Make sure you discuss any questions you have with your health care provider. °Document Released: 02/28/2013 Document Revised: 12/12/2015 Document Reviewed: 11/14/2015 °Elsevier Interactive Patient Education © 2018 [employer] ° °

## 2018-03-01 NOTE — Progress Notes (Addendum)
Advanced Heart Failure Rounding Note  PCP: [name], MD Primary Cardiologist: Dr [name]  Subjective:    Continues to diurese on torsemide 40 mg daily. Weight unchanged.  Creatinine has plateaued 1.93 > 2.02 > 2.11 > 2.12. SBP 100s.  EKG shows atrial flutter 113.  Walking in hallways. No CP or SOB. No missed doses of Xarelto.  Objective:   Weight Range: (!) 155.6 kg Body mass index is 52.17 kg/m.   Vital Signs:   Temp:  [97.6 F (36.4 C)-98.2 F (36.8 C)] 97.7 F (36.5 C) (10/09 0416) Pulse Rate:  [64-110] 110 (10/09 0416) Resp:  [16-19] 19 (10/09 0416) BP: (105-114)/(80-91) 107/91 (10/09 0416) SpO2:  [93 %-96 %] 93 % (10/09 0416) Weight:  [155.6 kg] 155.6 kg (10/09 0416) Last BM Date: 02/27/18  Weight change: Filed Weights   02/27/18 1506 02/28/18 0456 03/01/18 0416  Weight: (!) 154.7 kg (!) 155.7 kg (!) 155.6 kg    Intake/Output:   Intake/Output Summary (Last 24 hours) at 03/01/2018 0840 Last data filed at 03/01/2018 0418 Gross per 24 hour  Intake 924 ml  Output 1025 ml  Net -101 ml      Physical Exam    General:  No resp difficulty. HEENT: Normal Neck: Supple. JVP 7-8. Carotids 2+ bilat; no bruits. No thyromegaly or nodule noted. Cor: PMI nondisplaced. Tachy, irregular, No M/G/R noted Lungs: CTAB, normal effort. On RA Abdomen: Obese Soft, non-tender, non-distended, no HSM. No bruits or masses. +BS  Extremities: No cyanosis, clubbing, or rash. R and LLE 1-2+ edema. Neuro: Alert & orientedx3, cranial nerves grossly intact. moves all 4 extremities w/o difficulty. Affect pleasant  EKG: atrial flutter 113. Personally reviewed.   Labs    CBC Recent Labs    02/27/18 0729  WBC 6.1  HGB 13.6  HCT 43.9  MCV 100.2*  PLT 382   Basic Metabolic Panel Recent Labs    02/28/18 0403 03/01/18 0613  NA 141 139  K 3.7 3.8  CL 99 100  CO2 33* 32  GLUCOSE 119* 141*  BUN 43* 46*  CREATININE 2.11* 2.12*  CALCIUM 8.6* 8.8*   Liver  Function Tests No results for input(s): AST, ALT, ALKPHOS, BILITOT, PROT, ALBUMIN in the last 72 hours. No results for input(s): LIPASE, AMYLASE in the last 72 hours. Cardiac Enzymes No results for input(s): CKTOTAL, CKMB, CKMBINDEX, TROPONINI in the last 72 hours.  BNP: BNP (last 3 results) Recent Labs    12/27/17 2354 02/15/18 1559  BNP 283.3* 847.1*    ProBNP (last 3 results) No results for input(s): PROBNP in the last 8760 hours.   D-Dimer No results for input(s): DDIMER in the last 72 hours. Hemoglobin A1C No results for input(s): HGBA1C in the last 72 hours. Fasting Lipid Panel No results for input(s): CHOL, HDL, LDLCALC, TRIG, CHOLHDL, LDLDIRECT in the last 72 hours. Thyroid Function Tests No results for input(s): TSH, T4TOTAL, T3FREE, THYROIDAB in the last 72 hours.  Invalid input(s): FREET3  Other results:   Imaging    No results found.   Medications:     Scheduled Medications: . allopurinol  100 mg Oral Daily  . amiodarone  200 mg Oral BID  . aspirin EC  81 mg Oral Daily  . atorvastatin  80 mg Oral q1800  . feeding supplement (PRO-STAT SUGAR FREE 64)  30 mL Oral BID  . hydrALAZINE  75 mg Oral Q8H  . insulin aspart  0-15 Units Subcutaneous TID WC  .  insulin aspart  0-5 Units Subcutaneous QHS  . isosorbide mononitrate  30 mg Oral Daily  . metoprolol succinate  75 mg Oral Daily  . omega-3 acid ethyl esters  1 g Oral BID  . potassium chloride  40 mEq Oral Daily  . rivaroxaban  20 mg Oral Q supper  . torsemide  40 mg Oral Daily    Infusions:   PRN Medications: acetaminophen, alum & mag hydroxide-simeth, bisacodyl, diphenhydrAMINE, guaiFENesin-dextromethorphan, polyethylene glycol, prochlorperazine **OR** prochlorperazine **OR** prochlorperazine, sodium phosphate, traZODone    Patient Profile   Jay Chen is a 52 y.o. 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    Assessment/Plan   1. Acute on chronic combined CHF, NICM. No ICD. - Echo 12/2017 EF 20-25% - R/LHC normal cors. RA 17 PCWP 21 - Volume status okay - Continue 40 mg po torsemide daily for now.  - Continue Toprol 75  daily - Continue hydralazine 75 mg tid.  - Continue imdur 30 mg daily - No ARB/ARNi/spiro currently with CKD 3  2. AKI on CKD III - Baseline Cr 1.7 - 1.9 - Creatinine 1.93 > 2.02 >2.11>2.12 today. May be in setting of aflutter.   3. Persistent Afib/Aflutter - New diagnosis 08/2017, which was being treated with rate control.   -S/P DC-CV on 10/2.  - EKG today shows aflutter. - Continue amio to 200 mg twice a day. Plan to decrease amiodarone to 200 mg daily on 03/03/2018. Will confirm this with MD since he is back in flutter. - Continue Toprol 75 mg daily.  - Continue Xarelto 20 mg daily - Going for outpatient DCCV today at 2 pm.     4. H/o CVA - Only remaining deficit.  - On Xarelto chronically. No change.    5. HTN - SBP 100-110s - Cr borderline for ARB/ARNi/Spiro.    6. AAA  - 4.7 cm by echo 12/2017  - Will need further imaging as creatinine permits. No change.   7. OSA - Refuses CPAP. Discussed importance of CPAP. No change.  8. DM2 - Stable CBGs. Continue SSI. No change. - Hgb A1C 7.1 01/24/18   9. Morbid obesity -Body mass index is 52.17 kg/m. - Needs significant weight loss.   10. Severe Deconditioning.  CIR appreciated. Plans for DC today  Planning for DC today and then DCCV at 2 pm. He will go home with [facility]. Please check BMET in 1 week through [facility]. Follow up has been scheduled in HF clinic for 10/21 at 3 pm.   Heart failure team will sign off as of 03/01/18  HF Medication Recommendations for Home: Amiodarone 200 mg BID (do not decrease on 10/11  as previously planned) ASA 81 mg daily Atorvastatin 80 mg daily Hydralazine 75 mg TID Imdur 30 mg daily Toprol XL 75 mg daily Potassium 40 meq daily Xarelto 20 mg daily Torsemide 40 mg daily  Other recommendations (Labs,testing, etc): BMET through [facility] in 1 week. EKG at follow up.   Follow up as an outpatient: 10/21 3 pm in HF clinic (placed on AVS)   Length of Stay: [place], NP  03/01/2018, 8:40 AM  Advanced Heart Failure Team Pager [phone] (M-F;  7a - 4p)  Please contact [facility] for night-coverage after hours (4p -7a ) and weekends on [email or web address]  Patient seen and examined with the above-signed Advanced Practice Provider and/or Housestaff. I personally reviewed laboratory data, imaging studies and relevant notes. I independently examined the patient and formulated the important aspects of the plan. I have edited the note to reflect any of my changes or salient points. I have personally discussed the plan with the patient and/or family.  He feels ok. Volume status improved but still slightly elevated. Will be discharged from CIR today. Will do outpatient DC-CV for recurrent AF/AFl this afternoon. Continue amio 200 bid and Xarelto.   Glori Bickers, MD  11:04 AM

## 2018-03-01 NOTE — Progress Notes (Signed)
Physical Therapy Discharge Summary  Patient Details  Name: Jay Chen MRN: [id number] Date of Birth: 1965-09-06  Today's Date: 03/01/2018 PT Individual Time: 0830-0930 PT Individual Time Calculation (min): 60 min    Patient has met 8 of 9 long term goals due to improved activity tolerance and improved balance.  Patient to discharge at an ambulatory level Modified Independent.   Patient's care partner is independent to provide the necessary   assistance at discharge.  Reasons goals not met:  Did not attempt floor transfer due to short LOS  Recommendation:  Patient will benefit from ongoing skilled PT services in home health setting to continue to advance safe functional mobility, address ongoing impairments in endurance and balance, and minimize fall risk.  Equipment: No equipment provided  Reasons for discharge: treatment goals met  Patient/family agrees with progress made and goals achieved: Yes   Skilled PT Intervention: No c/o pain.  Session focus on d/c assessment.  Pt performing all mobility at mod I level, except supervision on compliant surfaces with rollator.  Discussed f/u with HHPT at discharge to progress balance and endurance.  UEB 2x5 min (forward/backward) at random level 5 for UE strengthening and cardiovascular endurance.  Pt returned to room at end of session and positioned in recliner with call bell in reach and needs met.   PT Discharge Precautions/Restrictions Precautions Precautions: Fall Precaution Comments: watch HR/O2 Restrictions Weight Bearing Restrictions: No Vital Signs O2 maintained >97% on room air throughout session.  HR 100-118 throughout session. Pain Pain Assessment Pain Scale: 0-10 Pain Score: 0-No pain Vision/Perception  Perception Perception: Within Functional Limits Praxis Praxis: Intact  Cognition Overall Cognitive Status: Within Functional Limits for tasks assessed Arousal/Alertness: Awake/alert Orientation Level: Oriented  X4 Selective Attention: Appears intact Alternating Attention: Appears intact Memory: Appears intact Awareness: Appears intact Problem Solving: Appears intact Safety/Judgment: Appears intact Sensation Sensation Light Touch: Appears Intact Hot/Cold: Appears Intact Proprioception: Appears Intact Stereognosis: Appears Intact Coordination Gross Motor Movements are Fluid and Coordinated: Yes Fine Motor Movements are Fluid and Coordinated: Yes Coordination and Movement Description: gross motor affected slightly by body habitus but able to meet all functional mobility demands Motor  Motor Motor: Within Functional Limits Motor - Discharge Observations: still with decreased endurance but improved since time of evaluation  Mobility Bed Mobility Bed Mobility: Sit to Supine;Supine to Sit Rolling Right: Independent Rolling Left: Independent Supine to Sit: Independent Sit to Supine: Independent Transfers Transfers: Sit to Stand;Stand Pivot Transfers Sit to Stand: Independent with assistive device Stand to Sit: Independent with assistive device Stand Pivot Transfers: Independent with assistive device Transfer (Assistive device): Rollator Locomotion  Gait Ambulation: Yes Gait Assistance: Independent with assistive device Gait Distance (Feet): 300 Feet Assistive device: (rollator) Gait Gait: Yes Gait Pattern: Wide base of support;Step-through pattern High Level Ambulation High Level Ambulation: Side stepping;Backwards walking Side Stepping: UE support, mod I Backwards Walking: UE support, supervision Stairs / Additional Locomotion Stairs: Yes Stairs Assistance: Independent with assistive device Stair Management Technique: Two rails Number of Stairs: 12 Wheelchair Mobility Wheelchair Mobility: No  Trunk/Postural Assessment  Cervical Assessment Cervical Assessment: Within Functional Limits Thoracic Assessment Thoracic Assessment: Within Functional Limits Lumbar  Assessment Lumbar Assessment: Within Functional Limits Postural Control Postural Control: Within Functional Limits  Balance Balance Balance Assessed: Yes Static Sitting Balance Static Sitting - Balance Support: Feet supported;No upper extremity supported Static Sitting - Level of Assistance: 7: Independent Dynamic Sitting Balance Dynamic Sitting - Balance Support: Feet supported;No upper extremity supported Dynamic Sitting - Level of  Assistance: 7: Independent Static Standing Balance Static Standing - Balance Support: Bilateral upper extremity supported;During functional activity Static Standing - Level of Assistance: 7: Independent Dynamic Standing Balance Dynamic Standing - Balance Support: Bilateral upper extremity supported;During functional activity Dynamic Standing - Level of Assistance: 7: Independent Extremity Assessment  RUE Assessment RUE Assessment: Within Functional Limits LUE Assessment LUE Assessment: Within Functional Limits RLE Assessment RLE Assessment: Within Functional Limits Active Range of Motion (AROM) Comments: WFL assessed in sitting General Strength Comments: 5/5 proximal to distal LLE Assessment LLE Assessment: Within Functional Limits Active Range of Motion (AROM) Comments: WFL assessed in sitting General Strength Comments: 5/5 proximal to distal    [name] 03/01/2018, 9:16 AM

## 2018-03-02 ENCOUNTER — Telehealth: Payer: Self-pay | Admitting: Registered Nurse

## 2018-03-02 NOTE — Telephone Encounter (Signed)
Transitional Care Call made, no answer left message to return the call.  1st Attempt.

## 2018-03-03 ENCOUNTER — Telehealth: Payer: Self-pay

## 2018-03-03 ENCOUNTER — Encounter (HOSPITAL_COMMUNITY): Payer: Self-pay | Admitting: Internal Medicine

## 2018-03-03 NOTE — Telephone Encounter (Signed)
Transitional Care call Transitional Care Call Completed  Patient name: [name]  DOB: 28-May-1965 1. Are you/is patient experiencing any problems since coming home? No a. Are there any questions regarding any aspect of care? No 2. Are there any questions regarding medications administration/dosing? No a. Are meds being taken as prescribed? Yes, Medication List Reviewed.  b. "Patient should review meds with caller to confirm"  3. Have there been any falls? No 4. Has Home Health been to the house and/or have they contacted you? Yes, [facility]  a. If not, have you tried to contact them? NA  b. Can we help you contact them? NA 5. Are bowels and bladder emptying properly? Yes a. Are there any unexpected incontinence issues? No b. If applicable, is patient following bowel/bladder programs? NA 6. Any fevers, problems with breathing, unexpected pain? No  Are there any skin problems or new areas of breakdown? No 7. Has the patient/family member arranged specialty MD follow up (ie cardiology/neurology/renal/surgical/etc.)?  All appointments were scheduled prior to discharge except cardiology. He will be calling his cardiologist today for scheduled appointment.  a. Can we help arrange? NA 8. Does the patient need any other services or support that we can help arrange? No 9. Are caregivers following through as expected in assisting the patient? Yes 10. Has the patient quit smoking, drinking alcohol, or using drugs as recommended? [name] reports he doesn't smoke, drink alcohol or use illicit drugs,   Appointment date/time   arrival time 03/14/2018, arrival time 11:40 for 12:00 appointment. With Dr. Naaman Plummer. At [place]

## 2018-03-03 NOTE — Telephone Encounter (Signed)
Angle RN [facility] called requesting verbal orders for CHF, medication management, a dietician consult, and a Education officer, museum for this patient. Along with visits for 1xwk X 5wks plus one PRN visit. Called her back and approved verbal orders

## 2018-03-06 ENCOUNTER — Encounter (HOSPITAL_COMMUNITY): Payer: BC Managed Care – PPO | Admitting: Cardiology

## 2018-03-13 ENCOUNTER — Encounter (HOSPITAL_COMMUNITY): Payer: Self-pay

## 2018-03-13 ENCOUNTER — Ambulatory Visit (HOSPITAL_COMMUNITY)
Admit: 2018-03-13 | Discharge: 2018-03-13 | Disposition: A | Discharge status: Home / Self Care | Payer: BC Managed Care – PPO | Attending: Internal Medicine | Admitting: Internal Medicine

## 2018-03-13 VITALS — BP 176/82 | HR 64 | Wt 339.8 lb

## 2018-03-13 DIAGNOSIS — I5043 Acute on chronic combined systolic (congestive) and diastolic (congestive) heart failure: Secondary | ICD-10-CM | POA: Insufficient documentation

## 2018-03-13 DIAGNOSIS — Z8673 Personal history of transient ischemic attack (TIA), and cerebral infarction without residual deficits: Secondary | ICD-10-CM | POA: Insufficient documentation

## 2018-03-13 DIAGNOSIS — I491 Atrial premature depolarization: Secondary | ICD-10-CM | POA: Insufficient documentation

## 2018-03-13 DIAGNOSIS — G4733 Obstructive sleep apnea (adult) (pediatric): Secondary | ICD-10-CM | POA: Diagnosis not present

## 2018-03-13 DIAGNOSIS — I10 Essential (primary) hypertension: Secondary | ICD-10-CM

## 2018-03-13 DIAGNOSIS — I5022 Chronic systolic (congestive) heart failure: Secondary | ICD-10-CM

## 2018-03-13 DIAGNOSIS — Z79899 Other long term (current) drug therapy: Secondary | ICD-10-CM | POA: Diagnosis not present

## 2018-03-13 DIAGNOSIS — Z8249 Family history of ischemic heart disease and other diseases of the circulatory system: Secondary | ICD-10-CM | POA: Insufficient documentation

## 2018-03-13 DIAGNOSIS — Z6841 Body Mass Index (BMI) 40.0 and over, adult: Secondary | ICD-10-CM | POA: Insufficient documentation

## 2018-03-13 DIAGNOSIS — E785 Hyperlipidemia, unspecified: Secondary | ICD-10-CM | POA: Diagnosis not present

## 2018-03-13 DIAGNOSIS — I130 Hypertensive heart and chronic kidney disease with heart failure and stage 1 through stage 4 chronic kidney disease, or unspecified chronic kidney disease: Secondary | ICD-10-CM | POA: Diagnosis not present

## 2018-03-13 DIAGNOSIS — R0683 Snoring: Secondary | ICD-10-CM | POA: Diagnosis not present

## 2018-03-13 DIAGNOSIS — I714 Abdominal aortic aneurysm, without rupture: Secondary | ICD-10-CM | POA: Insufficient documentation

## 2018-03-13 DIAGNOSIS — I712 Thoracic aortic aneurysm, without rupture: Secondary | ICD-10-CM | POA: Diagnosis not present

## 2018-03-13 DIAGNOSIS — N183 Chronic kidney disease, stage 3 (moderate): Secondary | ICD-10-CM | POA: Insufficient documentation

## 2018-03-13 DIAGNOSIS — E1122 Type 2 diabetes mellitus with diabetic chronic kidney disease: Secondary | ICD-10-CM | POA: Diagnosis not present

## 2018-03-13 DIAGNOSIS — Z7901 Long term (current) use of anticoagulants: Secondary | ICD-10-CM | POA: Insufficient documentation

## 2018-03-13 DIAGNOSIS — Z7982 Long term (current) use of aspirin: Secondary | ICD-10-CM | POA: Diagnosis not present

## 2018-03-13 DIAGNOSIS — M109 Gout, unspecified: Secondary | ICD-10-CM | POA: Insufficient documentation

## 2018-03-13 DIAGNOSIS — I428 Other cardiomyopathies: Secondary | ICD-10-CM | POA: Diagnosis not present

## 2018-03-13 DIAGNOSIS — I5042 Chronic combined systolic (congestive) and diastolic (congestive) heart failure: Secondary | ICD-10-CM

## 2018-03-13 DIAGNOSIS — Z87442 Personal history of urinary calculi: Secondary | ICD-10-CM | POA: Insufficient documentation

## 2018-03-13 DIAGNOSIS — I480 Paroxysmal atrial fibrillation: Secondary | ICD-10-CM | POA: Diagnosis not present

## 2018-03-13 LAB — CBC
HEMATOCRIT: 41.3 % (ref 39.0–52.0)
HEMOGLOBIN: 12.6 g/dL — AB (ref 13.0–17.0)
MCH: 29.8 pg (ref 26.0–34.0)
MCHC: 30.5 g/dL (ref 30.0–36.0)
MCV: 97.6 fL (ref 80.0–100.0)
NRBC: 0 % (ref 0.0–0.2)
Platelets: 242 10*3/uL (ref 150–400)
RBC: 4.23 MIL/uL (ref 4.22–5.81)
RDW: 15.4 % (ref 11.5–15.5)
WBC: 5.1 10*3/uL (ref 4.0–10.5)

## 2018-03-13 LAB — BASIC METABOLIC PANEL
Anion gap: 10 (ref 5–15)
BUN: 33 mg/dL — ABNORMAL HIGH (ref 6–20)
CHLORIDE: 99 mmol/L (ref 98–111)
CO2: 32 mmol/L (ref 22–32)
Calcium: 8.7 mg/dL — ABNORMAL LOW (ref 8.9–10.3)
Creatinine, Ser: 1.81 mg/dL — ABNORMAL HIGH (ref 0.61–1.24)
GFR calc non Af Amer: 41 mL/min — ABNORMAL LOW (ref >60)
GFR, EST AFRICAN AMERICAN: 47 mL/min — AB (ref >60)
Glucose, Bld: 93 mg/dL (ref 70–99)
POTASSIUM: 3.2 mmol/L — AB (ref 3.5–5.1)
SODIUM: 141 mmol/L (ref 135–145)

## 2018-03-13 MED ORDER — TORSEMIDE 20 MG PO TABS: ORAL_TABLET | ORAL | 3 refills | Status: DC

## 2018-03-13 MED ORDER — AMIODARONE HCL 200 MG PO TABS: 200.0000 mg | ORAL_TABLET | Freq: Every day | ORAL | 3 refills | Status: DC

## 2018-03-13 MED ORDER — HYDRALAZINE HCL 100 MG PO TABS: 100.0000 mg | ORAL_TABLET | Freq: Three times a day (TID) | ORAL | 3 refills | Status: DC

## 2018-03-13 MED ORDER — TORSEMIDE 20 MG PO TABS: ORAL_TABLET | ORAL | 0 refills | Status: DC

## 2018-03-13 NOTE — Patient Instructions (Signed)
INCREASE Hydralazine to 100 mg, one tab three times per day DECREASE Amiodarone to 200 mg, one tab daily CHANGE Torsemide 40 mg in the AM, and 20 mg in the PM as needed for weight 343 or greater   Labs today We will only contact you if something comes back abnormal or we need to make some changes. Otherwise no news is good news!  You have been referred to [place] [facility] [place] [facility] [phone] For further evaluation/management of sleep apnea  Your physician has recommended that you have a sleep study. This test records several body functions during sleep, including: brain activity, eye movement, oxygen and carbon dioxide blood levels, heart rate and rhythm, breathing rate and rhythm, the flow of air through your mouth and nose, snoring, body muscle movements, and chest and belly movement.  Your physician recommends that you schedule a follow-up appointment in: 4 weeks  in the Advanced Practition[facility] (PA/NP) Clinic

## 2018-03-13 NOTE — Progress Notes (Signed)
PCP: Dr [name]  Primary HF Cardiologist: Dr Vaughan Browner  Cardiology: Dr Gwenlyn Found   HPI: Jay Chen a 52 y.o.malewith acute on chronic combined CHF,presumed non-ischemic (no cath),EF 20-25%, AAA 4.7 cm by echo, hx of CVA, HTN, HLD, OSA, DM2, CKD III (Baseline Cr 1.7 - 1.9),and morbid obesity (Body mass index is 56.56 kg/m.)  Admitted from [facility] clinic 02/15/18 with acute on chronic combined CHF and marked volume overload.Diuresed with IV lasix 40 pounds. Diuretics held on the day of discharge due to increased creatinine. He underwent RHC/LHC with normal cors and moderate mixed pulmonary venous/arterial HTN.  HF medications optimized. He was not placed on spiro or arb due to elevated creatinine. Once diuresed he underwent successful DC-CV. He was discharged to inpatient rehab. He was discharged from rehab on 03/01/18. On the day of discharge he was sent for successful cardioversion.   Today he returns for HF follow up. Overall feeling fine. Remains SOB with exertion but feeling much better.  Denies PND/Orthopnea.Moving around more at home. Having day time fatigue and snoring. Appetite ok. No fever or chills. Weight at home 338-341 pounds. No bleeding problems. aking all medications. Followed by [facility] PT/RN.   RHC/LHC 02/20/2018 Ao = 125/87 (104) LV = 122/23  RA = 17  RV = 56/16 PA = 52/35 (42) PCW = 21 Fick cardiac output/index = 7.0.2.7 SVR = 993 PVR = 3.0 WU Ao sat = 92% PA sat = 66%, 65%  Assessment: 1. Normal coronaries 2. 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 Heart appears rotated 90% leftward on cath images 4. Moderate mixed pulmonary venous/arterial HTN  ECHO 12/2017 Left ventricle: The cavity size was moderately to severely   dilated. Wall thickness was increased in a pattern of moderate   LVH. Systolic function was severely reduced. The estimated   ejection fraction was in the range of 20% to 25%. Diffuse   hypokinesis. The study is not technically sufficient to allow  evaluation of LV diastolic function. - Aortic valve: Mildly calcified annulus. Valve area (VTI): 4.07   cm^2. Valve area (Vmax): 4.14 cm^2. Valve area (Vmean): 4.16   cm^2. - Aorta: There is a single still frame view of a portion of the   proximal ascending that is dilated measuring 4.7 cm, consistent   with moderate to large aneurysm. From chart review a similar   aneurysm was detected by CT scan 03/28/2012. Limited evaluation by   echo, consider alternative modality if more complete evaluation   is indicated. - Left atrium: The atrium was severely dilated. - Right ventricle: The cavity size was mildly dilated. Systolic   function was mildly reduced. - Right atrium: The atrium was mildly to moderately dilated. - Atrial septum: No defect or patent foramen ovale was identified. - Inferior vena cava: The vessel was dilated. The respirophasic   diameter changes were blunted (< 50%), consistent with elevated   central venous pressure. - Technically difficult study. Echocontrast was used to enhance   visualization.   ROS: All systems negative except as listed in HPI, PMH and Problem List.  SH:  Social History   Socioeconomic History  . Marital status: Married    Spouse name: Not on file  . Number of children: Not on file  . Years of education: Not on file  . Highest education level: Not on file  Occupational History  . Not on file  Social Needs  . Financial resource strain: Not on file  . Food insecurity:    Worry:  Not on file    Inability: Not on file  . Transportation needs:    Medical: Not on file    Non-medical: Not on file  Tobacco Use  . Smoking status: Never Smoker  . Smokeless tobacco: Never Used  Substance and Sexual Activity  . Alcohol use: Not Currently    Comment: 02/15/2018 "nothing in the last couple years"  . Drug use: Never  . Sexual activity: Yes  Lifestyle  . Physical activity:    Days per week: Not on file    Minutes per session: Not on file  .  Stress: Not on file  Relationships  . Social connections:    Talks on phone: Not on file    Gets together: Not on file    Attends religious service: Not on file    Active member of club or organization: Not on file    Attends meetings of clubs or organizations: Not on file    Relationship status: Not on file  . Intimate partner violence:    Fear of current or ex partner: Not on file    Emotionally abused: Not on file    Physically abused: Not on file    Forced sexual activity: Not on file  Other Topics Concern  . Not on file  Social History Narrative  . Not on file    FH:  Family History  Problem Relation Age of Onset  . Hypertension Mother   . Pancreatic cancer Father   . Prostate cancer Father   . Colon cancer Father     Past Medical History:  Diagnosis Date  . CKD (chronic kidney disease), stage III ([place])   . Congestive heart failure [facility]) May of 2011   Felt to have cor pulmonale; EF 45 to 50% from echo in May 2011  . Cor pulmonale (chronic) ([place])   . Gout    "take daily RX" (02/15/2018)  . History of kidney stones   . Hyperlipidemia   . Hypertension   . Morbid obesity ([place])   . Persistent atrial fibrillation    [name] 12/28/2017  . Sleep apnea    "dx'd; couldn't tolerate CPAP" (02/15/2018)  . Thoracic aneurysm    a. 4.8cm thoracic aortic aneurysm by CT 2013.  [name] Thoracic aortic aneurysm ([place])    known/notes 12/28/2017  . Type II diabetes mellitus ([place])     Current Outpatient Medications  Medication Sig Dispense Refill  . acetaminophen (TYLENOL) 325 MG tablet Take 1-2 tablets (325-650 mg total) by mouth every 6 (six) hours as needed for mild pain.    [name] allopurinol (ZYLOPRIM) 100 MG tablet Take 1 tablet (100 mg total) by mouth daily. 90 tablet 0  . amiodarone (PACERONE) 200 MG tablet Take 1 tablet (200 mg total) by mouth 2 (two) times daily. 60 tablet 0  . aspirin 81 MG EC tablet Take 1 tablet (81 mg total) by mouth daily.    [name] atorvastatin (LIPITOR) 80 MG tablet  Take 1 tablet (80 mg total) by mouth daily. 30 tablet 1  . hydrALAZINE (APRESOLINE) 50 MG tablet Take 1.5 tablets (75 mg total) by mouth every 8 (eight) hours. 180 tablet 0  . isosorbide mononitrate (IMDUR) 30 MG 24 hr tablet Take 1 tablet (30 mg total) by mouth daily. 30 tablet 0  . metoprolol succinate (TOPROL-XL) 25 MG 24 hr tablet Take 3 tablets (75 mg total) by mouth daily. 90 tablet 0  . omega-3 acid ethyl esters (LOVAZA) 1 g capsule Take 2 capsules (2  g total) by mouth 2 (two) times daily. 120 capsule 11  . potassium chloride SA (KLOR-CON M20) 20 MEQ tablet Take 2 tablets (40 mEq total) by mouth daily. 60 tablet 1  . rivaroxaban (XARELTO) 20 MG TABS tablet Take 1 tablet (20 mg total) by mouth daily with supper. 90 tablet 1  . torsemide (DEMADEX) 20 MG tablet Take 2 tablets (40 mg total) by mouth daily. 30 tablet 0   No current facility-administered medications for this encounter.     Vitals:   03/13/18 1445  BP: (!) 176/82  Pulse: 64  SpO2: 96%  Weight: (!) 154.1 kg (339 lb 12.8 oz)   Wt Readings from Last 3 Encounters:  03/13/18 (!) 154.1 kg (339 lb 12.8 oz)  03/01/18 (!) 155.6 kg (343 lb 2 oz)  02/23/18 (!) 156.2 kg (344 lb 6.4 oz)     PHYSICAL EXAM: General:  Walked in the clinic with a walker. No resp difficulty HEENT: normal Neck: supple. JVP 6-7. Carotids 2+ bilaterally; no bruits. No lymphadenopathy or thryomegaly appreciated. Cor: PMI normal. Regular rate & rhythm. No rubs, gallops or murmurs. Lungs: clear Abdomen: obese, soft, nontender, nondistended. No hepatosplenomegaly. No bruits or masses. Good bowel sounds. Extremities: no cyanosis, clubbing, rash, R and LLE trace edema Neuro: alert & orientedx3, cranial nerves grossly intact. Moves all 4 extremities w/o difficulty. Affect pleasant.   ECG:SR 1st degree heart block. 280 ms QT-C 554 ms  ASSESSMENT & PLAN: 1. Acute on chronic combined CHF,presumed non-ischemic (no cath) - Echo 8/2019EF 20-25% - R/LHC  normal cors. RA 17 PCWP 21 - NYHA II-III. Volume status stable. Continue torsemide 40 mg daily with extra 20 mg torsemide for weight 343 pounds. .  - Increase hydralazine 100 mg three times a day.  - Continue imdur 30 mg daily - No ARB/ARNi/spirocurrently with CKD 3. Hold off for now.   2. CKD III - Baseline Cr 1.7 - 1.9 Check BMET today   3. PAF - New diagnosis 08/2017, which was being treated with rate control.   -S/P DC-CV on 10/2 and 03/01/2018 I-n NSR today.   - Cut back amio to 200 mg daily.   - Continue  Toprol 75 mg daily.  - Continue Xarelto 20 mg daily  4.H/o CVA -Only remainingdeficit. - On Xarelto chronically.  5. HTN Elevated.    6.AAA -4.7 cm by echo8/2019 - Will need further imaging as creatinine permits.   7. OSA -Has daytime fatigue and snores. Suspect he has sleep apnea.  - Refer to Dr Radford Pax for sleep study.   8. DM2 - Stable CBGs. Continue SSI - Hgb A1C 7.1 01/24/18  9. Morbid obesity Body mass index is 51.67 kg/m. - Needs significant weight loss.  - Nutrition consult ordered   Follow up 2-3 weeks.   Sani Madariaga  NP-C  4:53 PM

## 2018-03-14 ENCOUNTER — Other Ambulatory Visit: Payer: Self-pay

## 2018-03-14 ENCOUNTER — Encounter
Payer: BC Managed Care – PPO | Attending: Physical Medicine & Rehabilitation | Admitting: Physical Medicine & Rehabilitation

## 2018-03-14 ENCOUNTER — Encounter: Payer: Self-pay | Admitting: Physical Medicine & Rehabilitation

## 2018-03-14 ENCOUNTER — Encounter (HOSPITAL_COMMUNITY): Payer: Self-pay

## 2018-03-14 VITALS — BP 124/77 | HR 79 | Ht 68.0 in | Wt 341.0 lb

## 2018-03-14 DIAGNOSIS — N183 Chronic kidney disease, stage 3 (moderate): Secondary | ICD-10-CM | POA: Diagnosis not present

## 2018-03-14 DIAGNOSIS — Z6841 Body Mass Index (BMI) 40.0 and over, adult: Secondary | ICD-10-CM | POA: Diagnosis not present

## 2018-03-14 DIAGNOSIS — M4802 Spinal stenosis, cervical region: Secondary | ICD-10-CM | POA: Diagnosis not present

## 2018-03-14 DIAGNOSIS — M5416 Radiculopathy, lumbar region: Secondary | ICD-10-CM

## 2018-03-14 DIAGNOSIS — E785 Hyperlipidemia, unspecified: Secondary | ICD-10-CM | POA: Insufficient documentation

## 2018-03-14 DIAGNOSIS — I130 Hypertensive heart and chronic kidney disease with heart failure and stage 1 through stage 4 chronic kidney disease, or unspecified chronic kidney disease: Secondary | ICD-10-CM | POA: Insufficient documentation

## 2018-03-14 DIAGNOSIS — Z79899 Other long term (current) drug therapy: Secondary | ICD-10-CM | POA: Diagnosis not present

## 2018-03-14 DIAGNOSIS — Z8249 Family history of ischemic heart disease and other diseases of the circulatory system: Secondary | ICD-10-CM | POA: Insufficient documentation

## 2018-03-14 DIAGNOSIS — I482 Chronic atrial fibrillation, unspecified: Secondary | ICD-10-CM

## 2018-03-14 DIAGNOSIS — I5042 Chronic combined systolic (congestive) and diastolic (congestive) heart failure: Secondary | ICD-10-CM | POA: Insufficient documentation

## 2018-03-14 DIAGNOSIS — I6389 Other cerebral infarction: Secondary | ICD-10-CM | POA: Diagnosis not present

## 2018-03-14 DIAGNOSIS — I10 Essential (primary) hypertension: Secondary | ICD-10-CM | POA: Diagnosis not present

## 2018-03-14 DIAGNOSIS — E1142 Type 2 diabetes mellitus with diabetic polyneuropathy: Secondary | ICD-10-CM | POA: Diagnosis not present

## 2018-03-14 DIAGNOSIS — E876 Hypokalemia: Secondary | ICD-10-CM | POA: Diagnosis not present

## 2018-03-14 DIAGNOSIS — M4726 Other spondylosis with radiculopathy, lumbar region: Secondary | ICD-10-CM | POA: Insufficient documentation

## 2018-03-14 DIAGNOSIS — Z7982 Long term (current) use of aspirin: Secondary | ICD-10-CM | POA: Insufficient documentation

## 2018-03-14 DIAGNOSIS — G4730 Sleep apnea, unspecified: Secondary | ICD-10-CM | POA: Diagnosis not present

## 2018-03-14 DIAGNOSIS — I712 Thoracic aortic aneurysm, without rupture: Secondary | ICD-10-CM | POA: Diagnosis not present

## 2018-03-14 DIAGNOSIS — Z09 Encounter for follow-up examination after completed treatment for conditions other than malignant neoplasm: Secondary | ICD-10-CM | POA: Diagnosis present

## 2018-03-14 DIAGNOSIS — M109 Gout, unspecified: Secondary | ICD-10-CM | POA: Insufficient documentation

## 2018-03-14 DIAGNOSIS — E1122 Type 2 diabetes mellitus with diabetic chronic kidney disease: Secondary | ICD-10-CM | POA: Insufficient documentation

## 2018-03-14 MED ORDER — POTASSIUM CHLORIDE CRYS ER 20 MEQ PO TBCR: 60.0000 meq | EXTENDED_RELEASE_TABLET | Freq: Every day | ORAL | 3 refills | Status: DC

## 2018-03-14 NOTE — Progress Notes (Signed)
Subjective:    Patient ID: Jay Chen, male    DOB: Oct 19, 1965, 52 y.o.   MRN: [id number]  HPI   This is a transitional care visit for Jay Chen who was discharged on 10/9 with [facility] follow up. Nursing has been out so far, and he's had two therapy evaluations.    Pain Inventory Average Pain 0 Pain Right Now 0 Jay pain is no pain  In the last 24 hours, has pain interfered with the following? General activity 0 Relation with others 0 Enjoyment of life 0 What TIME of day is your pain at its worst? no pain Sleep (in general) Fair  Pain is worse with: no pain Pain improves with: no pain Relief from Meds: no meds  Mobility use a walker how many minutes can you walk? 10 ability to climb steps?  yes do you drive?  yes  Function not employed: date last employed n/a  Neuro/Psych No problems in this area  Prior Studies Any changes since last visit?  no  Physicians involved in your care Primary care Dr. [name]   Family History  Problem Relation Age of Onset  . Hypertension Mother   . Pancreatic cancer Father   . Prostate cancer Father   . Colon cancer Father    Social History   Socioeconomic History  . Marital status: Married    Spouse name: Not on file  . Number of children: 2  . Years of education: Not on file  . Highest education level: Bachelor's degree (e.g., BA, AB, BS)  Occupational History  . Occupation: unemployed  Social Needs  . Financial resource strain: Not hard at all  . Food insecurity:    Worry: Never true    Inability: Never true  . Transportation needs:    Medical: No    Non-medical: No  Tobacco Use  . Smoking status: Never Smoker  . Smokeless tobacco: Never Used  Substance and Sexual Activity  . Alcohol use: Not Currently    Comment: 02/15/2018 "nothing in the last couple years"  . Drug use: Never  . Sexual activity: Yes  Lifestyle  . Physical activity:    Days per week: Not on file    Minutes per session: Not on file  .  Stress: Not on file  Relationships  . Social connections:    Talks on phone: Not on file    Gets together: Not on file    Attends religious service: Not on file    Active member of club or organization: Not on file    Attends meetings of clubs or organizations: Not on file    Relationship status: Not on file  Other Topics Concern  . Not on file  Social History Narrative  . Not on file   Past Surgical History:  Procedure Laterality Date  . CARDIOVERSION N/A 02/22/2018   Procedure: CARDIOVERSION;  Surgeon: Jolaine Artist, MD;  Location: [facility] ENDOSCOPY;  Service: Cardiovascular;  Laterality: N/A;  . CARDIOVERSION N/A 03/01/2018   Procedure: CARDIOVERSION;  Surgeon: Jolaine Artist, MD;  Location: [place];  Service: Cardiovascular;  Laterality: N/A;  . LAPAROSCOPIC CHOLECYSTECTOMY  2000  . RIGHT/LEFT HEART CATH AND CORONARY ANGIOGRAPHY N/A 02/20/2018   Procedure: RIGHT/LEFT HEART CATH AND CORONARY ANGIOGRAPHY;  Surgeon: Jolaine Artist, MD;  Location: [place] CV LAB;  Service: Cardiovascular;  Laterality: N/A;  . US ECHOCARDIOGRAPHY  09/21/2009   EF 45-50%; Cavity size was severely dilated, severe concentric hypertrophy and normal wall motion  Past Medical History:  Diagnosis Date  . CKD (chronic kidney disease), stage III ([place])   . Congestive heart failure [facility]) May of 2011   Felt to have cor pulmonale; EF 45 to 50% from echo in May 2011  . Cor pulmonale (chronic) ([place])   . Gout    "take daily RX" (02/15/2018)  . History of kidney stones   . Hyperlipidemia   . Hypertension   . Morbid obesity ([place])   . Persistent atrial fibrillation    [name] 12/28/2017  . Sleep apnea    "dx'd; couldn't tolerate CPAP" (02/15/2018)  . Thoracic aneurysm    a. 4.8cm thoracic aortic aneurysm by CT 2013.  [name] Thoracic aortic aneurysm ([place])    known/notes 12/28/2017  . Type II diabetes mellitus ([facility])    BP 124/77   Pulse 79   Ht 5\' 8"  (1.727 m)   Wt (!) 341 lb (154.7 kg)   SpO2 93%    BMI 51.85 kg/m   Opioid Risk Score:   Fall Risk Score:  `1  Depression screen PHQ 2/9  Depression screen [facility] 2/9 03/14/2018 02/01/2018 01/25/2018 10/12/2016  Decreased Interest 0 0 0 0  Down, Depressed, Hopeless 0 0 1 1  PHQ - 2 Score 0 0 1 1  Altered sleeping - - - 1  Tired, decreased energy - - - 1  Change in appetite - - - 1  Feeling bad or failure about yourself  - - - 1  Trouble concentrating - - - 0  Moving slowly or fidgety/restless - - - 0  Suicidal thoughts - - - 0  PHQ-9 Score - - - 5    Review of Systems  Constitutional: Negative.   HENT: Negative.   Eyes: Negative.   Respiratory: Negative.   Cardiovascular: Negative.   Gastrointestinal: Negative.   Endocrine: Negative.   Genitourinary: Negative.   Musculoskeletal: Negative.   Skin: Negative.   Allergic/Immunologic: Negative.   Neurological: Negative.   Hematological: Negative.   Psychiatric/Behavioral: Negative.   All other systems reviewed and are negative.      Objective:   Physical Exam  General: Alert and oriented x 3, No apparent distress.  Morbidly obese  HEENT: Head is normocephalic, atraumatic, PERRLA, EOMI, sclera anicteric, oral mucosa pink and moist, dentition intact, ext ear canals clear,  Neck: Supple without JVD or lymphadenopathy Heart: Reg rate and rhythm. No murmurs rubs or gallops Chest: CTA bilaterally without wheezes, rales, or rhonchi; no distress Abdomen: Soft, non-tender, non-distended, bowel sounds positive. Extremities: 2+ edema bilateral LE. Chronic vascular changes.  Skin: Clean and intact. Chronic changes Neuro: Pt is cognitively appropriate with normal insight, memory, and awareness. Cranial nerves 2-12 are intact. Sensory exam is normal. Reflexes are 2+ in all 4's. Fine motor coordination is intact. No tremors. Motor function is grossly 5/5--sl HF weakness 4/5. patient ambulates with his rolling walker with somewhat of a shuffling gait but is very steady.  Does well with  changes in direction.  Musculoskeletal: Full ROM, No pain with AROM or PROM in the neck, trunk, or extremities. Posture appropriate Psych: Pt's affect is appropriate. Pt is cooperative         Assessment & Plan:  1.Decreased functional mobilitysecondary to cervical stenosis with cord compression, lumbar spondylosis with radiculopathy, left temporal lobe infarction.  -Patient is to follow-up outpatient with Dr. Kathyrn Sheriff to discuss possible surgical interventions for lumbar radiculopathy             -continue with [facility] therapies  -  probably can advance from walker to cane soon. Will leave this to PT's discretion.  2. DVT Prophylaxis/Anticoagulation: Xarelto to continues. 3. Pain Management: tylenol prn. Off all other meds 4. Skin/Wound Care: local mgt 5: Atrial fibrillation.  Amiodarone 200 mg twice daily,Toprol 100mg  daily             -had DC cardioversion immediately after discharge from rehab 6.Chronic systolic and diastolic congestive heart failure.     Demadex 40 mg Daily, and 20mg  prn   per cardiology  -increase kdur for potassium (3.2 on 10/2)               7. Diabetes mellitus with peripheral neuropathy. Hemoglobin A1c 8.4. SSI. Check blood sugars before meals and at bedtime. Patient on Glucophage 1000 mg twice daily prior to admission currently on hold due to renal insufficiency             -stagger CBG checks so that he's checking throughout the day 12.CKD stage III.              followed by primary 15.Constipation. Bowels are moving with laxative assistance   Doing quite well at present. I will not require that he come back to see me given his PCP and cardiology follow up already in place. He will see NS too re: his lumbar spine at some point as well.  Fifteen minutes of face to face patient care time were spent during this visit. All questions were encouraged and answered.

## 2018-03-14 NOTE — Patient Instructions (Signed)
PLEASE FEEL FREE TO CALL OUR OFFICE WITH ANY PROBLEMS OR QUESTIONS ([phone])       STAGGER YOUR BLOOD GLUCOSE CHECKS. DON'T ALWAYS CHECK THEM IN THE MORNING.

## 2018-03-14 NOTE — Addendum Note (Signed)
Encounter addended by: Jorge Ny, LCSW on: 03/14/2018 10:30 AM  Actions taken: Sign clinical note, Visit Navigator Flowsheet section accepted

## 2018-03-14 NOTE — Progress Notes (Signed)
Pt completed Social Determinants of Health screening- no concerns identified at this time  CSW will continue to follow in clinic and assist as needed  Jorge Ny, [place] Worker [facility] [phone]

## 2018-03-15 ENCOUNTER — Inpatient Hospital Stay: Payer: BC Managed Care – PPO | Admitting: Internal Medicine

## 2018-03-15 ENCOUNTER — Telehealth: Payer: Self-pay | Admitting: *Deleted

## 2018-03-15 NOTE — Telephone Encounter (Signed)
Merry Proud, PT, [facility] left a message asking for verbal orders for home health PT 1week2 to address gait, balance, strength, and cardiopulmonary rehab.Medical record reviewed. Social work note reviewed.  Verbal orders given per office protocol.

## 2018-03-17 ENCOUNTER — Other Ambulatory Visit (HOSPITAL_COMMUNITY): Payer: Self-pay | Admitting: Cardiology

## 2018-03-20 ENCOUNTER — Ambulatory Visit (HOSPITAL_COMMUNITY)
Admission: RE | Admit: 2018-03-20 | Discharge: 2018-03-20 | Disposition: A | Discharge status: Home / Self Care | Payer: BC Managed Care – PPO | Source: Ambulatory Visit | Attending: Internal Medicine | Admitting: Internal Medicine

## 2018-03-20 DIAGNOSIS — I5042 Chronic combined systolic (congestive) and diastolic (congestive) heart failure: Secondary | ICD-10-CM | POA: Diagnosis present

## 2018-03-20 LAB — BASIC METABOLIC PANEL
ANION GAP: 9 (ref 5–15)
BUN: 26 mg/dL — ABNORMAL HIGH (ref 6–20)
CALCIUM: 8.9 mg/dL (ref 8.9–10.3)
CO2: 32 mmol/L (ref 22–32)
CREATININE: 1.78 mg/dL — AB (ref 0.61–1.24)
Chloride: 103 mmol/L (ref 98–111)
GFR calc Af Amer: 48 mL/min — ABNORMAL LOW (ref >60)
GFR, EST NON AFRICAN AMERICAN: 42 mL/min — AB (ref >60)
Glucose, Bld: 91 mg/dL (ref 70–99)
POTASSIUM: 3.9 mmol/L (ref 3.5–5.1)
Sodium: 144 mmol/L (ref 135–145)

## 2018-03-20 NOTE — Addendum Note (Signed)
Encounter addended by: Kerry Dory, CMA on: 03/20/2018 12:00 PM  Actions taken: Sign clinical note

## 2018-03-20 NOTE — Progress Notes (Signed)
During patients lab visit patient requested a order for leg wraps, reports legs have started to weep over the weekend, reports weight was up and did increase torsemide as instructed for two days. Reports weight has returned to baseline. Declines SOB, CP, or dizziness.   Per [place] to send order for wraps May take additional 20 mg of torsemide for 2 days as needed for swelling Will await lab results for further instructions if needed

## 2018-03-24 ENCOUNTER — Emergency Department (HOSPITAL_COMMUNITY): Payer: BC Managed Care – PPO

## 2018-03-24 ENCOUNTER — Telehealth: Payer: Self-pay

## 2018-03-24 ENCOUNTER — Encounter (HOSPITAL_COMMUNITY): Payer: Self-pay | Admitting: Emergency Medicine

## 2018-03-24 ENCOUNTER — Ambulatory Visit: Payer: Self-pay | Admitting: *Deleted

## 2018-03-24 ENCOUNTER — Observation Stay (HOSPITAL_COMMUNITY)
Admission: EM | Admit: 2018-03-24 | Discharge: 2018-03-28 | Disposition: A | Discharge status: Home Health | Payer: BC Managed Care – PPO | Attending: Internal Medicine | Admitting: Internal Medicine

## 2018-03-24 DIAGNOSIS — I4819 Other persistent atrial fibrillation: Secondary | ICD-10-CM | POA: Insufficient documentation

## 2018-03-24 DIAGNOSIS — I420 Dilated cardiomyopathy: Secondary | ICD-10-CM | POA: Diagnosis not present

## 2018-03-24 DIAGNOSIS — R600 Localized edema: Secondary | ICD-10-CM

## 2018-03-24 DIAGNOSIS — Z7901 Long term (current) use of anticoagulants: Secondary | ICD-10-CM | POA: Insufficient documentation

## 2018-03-24 DIAGNOSIS — Z6841 Body Mass Index (BMI) 40.0 and over, adult: Secondary | ICD-10-CM | POA: Insufficient documentation

## 2018-03-24 DIAGNOSIS — E785 Hyperlipidemia, unspecified: Secondary | ICD-10-CM | POA: Insufficient documentation

## 2018-03-24 DIAGNOSIS — G4733 Obstructive sleep apnea (adult) (pediatric): Secondary | ICD-10-CM | POA: Diagnosis not present

## 2018-03-24 DIAGNOSIS — M79672 Pain in left foot: Secondary | ICD-10-CM | POA: Diagnosis not present

## 2018-03-24 DIAGNOSIS — Z87442 Personal history of urinary calculi: Secondary | ICD-10-CM | POA: Diagnosis not present

## 2018-03-24 DIAGNOSIS — M25473 Effusion, unspecified ankle: Secondary | ICD-10-CM | POA: Diagnosis not present

## 2018-03-24 DIAGNOSIS — M79673 Pain in unspecified foot: Secondary | ICD-10-CM

## 2018-03-24 DIAGNOSIS — Z800 Family history of malignant neoplasm of digestive organs: Secondary | ICD-10-CM | POA: Insufficient documentation

## 2018-03-24 DIAGNOSIS — I10 Essential (primary) hypertension: Secondary | ICD-10-CM | POA: Diagnosis present

## 2018-03-24 DIAGNOSIS — E1140 Type 2 diabetes mellitus with diabetic neuropathy, unspecified: Secondary | ICD-10-CM | POA: Insufficient documentation

## 2018-03-24 DIAGNOSIS — Z8249 Family history of ischemic heart disease and other diseases of the circulatory system: Secondary | ICD-10-CM | POA: Insufficient documentation

## 2018-03-24 DIAGNOSIS — I878 Other specified disorders of veins: Secondary | ICD-10-CM | POA: Diagnosis present

## 2018-03-24 DIAGNOSIS — I2781 Cor pulmonale (chronic): Secondary | ICD-10-CM | POA: Insufficient documentation

## 2018-03-24 DIAGNOSIS — M79671 Pain in right foot: Secondary | ICD-10-CM | POA: Diagnosis not present

## 2018-03-24 DIAGNOSIS — E8770 Fluid overload, unspecified: Secondary | ICD-10-CM

## 2018-03-24 DIAGNOSIS — E781 Pure hyperglyceridemia: Secondary | ICD-10-CM | POA: Diagnosis not present

## 2018-03-24 DIAGNOSIS — N183 Chronic kidney disease, stage 3 unspecified: Secondary | ICD-10-CM | POA: Diagnosis present

## 2018-03-24 DIAGNOSIS — E1122 Type 2 diabetes mellitus with diabetic chronic kidney disease: Secondary | ICD-10-CM | POA: Insufficient documentation

## 2018-03-24 DIAGNOSIS — M5416 Radiculopathy, lumbar region: Secondary | ICD-10-CM | POA: Diagnosis not present

## 2018-03-24 DIAGNOSIS — M109 Gout, unspecified: Secondary | ICD-10-CM | POA: Diagnosis not present

## 2018-03-24 DIAGNOSIS — Z8042 Family history of malignant neoplasm of prostate: Secondary | ICD-10-CM | POA: Insufficient documentation

## 2018-03-24 DIAGNOSIS — I480 Paroxysmal atrial fibrillation: Secondary | ICD-10-CM | POA: Diagnosis not present

## 2018-03-24 DIAGNOSIS — I714 Abdominal aortic aneurysm, without rupture: Secondary | ICD-10-CM | POA: Insufficient documentation

## 2018-03-24 DIAGNOSIS — R262 Difficulty in walking, not elsewhere classified: Secondary | ICD-10-CM | POA: Diagnosis not present

## 2018-03-24 DIAGNOSIS — I712 Thoracic aortic aneurysm, without rupture: Secondary | ICD-10-CM | POA: Diagnosis not present

## 2018-03-24 DIAGNOSIS — Z9049 Acquired absence of other specified parts of digestive tract: Secondary | ICD-10-CM | POA: Insufficient documentation

## 2018-03-24 DIAGNOSIS — I5042 Chronic combined systolic (congestive) and diastolic (congestive) heart failure: Secondary | ICD-10-CM | POA: Insufficient documentation

## 2018-03-24 DIAGNOSIS — Z7982 Long term (current) use of aspirin: Secondary | ICD-10-CM | POA: Insufficient documentation

## 2018-03-24 DIAGNOSIS — I482 Chronic atrial fibrillation, unspecified: Secondary | ICD-10-CM | POA: Diagnosis present

## 2018-03-24 DIAGNOSIS — I130 Hypertensive heart and chronic kidney disease with heart failure and stage 1 through stage 4 chronic kidney disease, or unspecified chronic kidney disease: Secondary | ICD-10-CM | POA: Diagnosis not present

## 2018-03-24 DIAGNOSIS — M79605 Pain in left leg: Secondary | ICD-10-CM

## 2018-03-24 DIAGNOSIS — M79604 Pain in right leg: Secondary | ICD-10-CM

## 2018-03-24 DIAGNOSIS — Z79899 Other long term (current) drug therapy: Secondary | ICD-10-CM | POA: Insufficient documentation

## 2018-03-24 LAB — COMPREHENSIVE METABOLIC PANEL
ALT: 24 U/L (ref 0–44)
AST: 19 U/L (ref 15–41)
Albumin: 2.8 g/dL — ABNORMAL LOW (ref 3.5–5.0)
Alkaline Phosphatase: 82 U/L (ref 38–126)
Anion gap: 6 (ref 5–15)
BUN: 26 mg/dL — ABNORMAL HIGH (ref 6–20)
CO2: 35 mmol/L — ABNORMAL HIGH (ref 22–32)
CREATININE: 1.99 mg/dL — AB (ref 0.61–1.24)
Calcium: 8.7 mg/dL — ABNORMAL LOW (ref 8.9–10.3)
Chloride: 98 mmol/L (ref 98–111)
GFR calc non Af Amer: 36 mL/min — ABNORMAL LOW (ref >60)
GFR, EST AFRICAN AMERICAN: 42 mL/min — AB (ref >60)
Glucose, Bld: 112 mg/dL — ABNORMAL HIGH (ref 70–99)
Potassium: 3.7 mmol/L (ref 3.5–5.1)
SODIUM: 139 mmol/L (ref 135–145)
Total Bilirubin: 1.6 mg/dL — ABNORMAL HIGH (ref 0.3–1.2)
Total Protein: 6.7 g/dL (ref 6.5–8.1)

## 2018-03-24 LAB — CBC WITH DIFFERENTIAL/PLATELET
ABS IMMATURE GRANULOCYTES: 0.02 10*3/uL (ref 0.00–0.07)
BASOS PCT: 0 %
Basophils Absolute: 0 10*3/uL (ref 0.0–0.1)
Eosinophils Absolute: 0.1 10*3/uL (ref 0.0–0.5)
Eosinophils Relative: 1 %
HCT: 38.2 % — ABNORMAL LOW (ref 39.0–52.0)
Hemoglobin: 12.5 g/dL — ABNORMAL LOW (ref 13.0–17.0)
IMMATURE GRANULOCYTES: 0 %
Lymphocytes Relative: 16 %
Lymphs Abs: 1.4 10*3/uL (ref 0.7–4.0)
MCH: 32 pg (ref 26.0–34.0)
MCHC: 32.7 g/dL (ref 30.0–36.0)
MCV: 97.7 fL (ref 80.0–100.0)
Monocytes Absolute: 1.3 10*3/uL — ABNORMAL HIGH (ref 0.1–1.0)
Monocytes Relative: 15 %
NEUTROS ABS: 5.9 10*3/uL (ref 1.7–7.7)
NEUTROS PCT: 68 %
NRBC: 0 % (ref 0.0–0.2)
PLATELETS: 234 10*3/uL (ref 150–400)
RBC: 3.91 MIL/uL — AB (ref 4.22–5.81)
RDW: 15.4 % (ref 11.5–15.5)
WBC: 8.8 10*3/uL (ref 4.0–10.5)

## 2018-03-24 LAB — URINALYSIS, ROUTINE W REFLEX MICROSCOPIC
BILIRUBIN URINE: NEGATIVE
GLUCOSE, UA: NEGATIVE mg/dL
HGB URINE DIPSTICK: NEGATIVE
Ketones, ur: NEGATIVE mg/dL
Leukocytes, UA: NEGATIVE
Nitrite: NEGATIVE
PH: 7 (ref 5.0–8.0)
Protein, ur: NEGATIVE mg/dL
SPECIFIC GRAVITY, URINE: 1.009 (ref 1.005–1.030)

## 2018-03-24 LAB — BRAIN NATRIURETIC PEPTIDE: B Natriuretic Peptide: 285.1 pg/mL — ABNORMAL HIGH (ref 0.0–100.0)

## 2018-03-24 MED ORDER — FUROSEMIDE 20 MG PO TABS: 20.0000 mg | ORAL_TABLET | Freq: Every day | ORAL | 0 refills | Status: DC

## 2018-03-24 NOTE — Telephone Encounter (Signed)
Jay Chen, HHPT from [facility] called requesting verbal orders extended 2wk1, 1wk2 for strength endurance and balance training. Approved and given orders per discharge summary.

## 2018-03-24 NOTE — Telephone Encounter (Signed)
Wife, [name] [name] called in concerned about husband having swollen feet and legs and being so weak he can't hardly walk today.   He is also c/o pain in his feet and legs.  My husband has been I the hospital twice during the last 2 months.    He has A. Fib. And circulation concerns.   His feet and legs were swollen then too in the hospital. This morning he is unable to go to the bathroom.  He can't even take 4 steps.  He can't even use his walker.  His feet and legs are swollen.   He was in the hospital a couple of weeks ago. No chest pain or difficulty breathing.   Both of his feet hurt so bad.   He does has diabetes.   He ate a banana, grapes and orange juice.    I have recommended he be transported to the ED via EMS due to his inability to ambulate.   Wife agreed she thought that's what she ought to do but wanted to call and see what we thought.   She is going to call EMS to transport him to [facility].    I have routed a note to Dr. Scarlette Calico.  Reason for Disposition . [1] Can't walk or can barely walk AND [2] new onset    C/o very painful and tight feet and ankles from the swelling.   Also very weak.   Unable to ambulate.  Answer Assessment - Initial Assessment Questions 1. ONSET: "When did the swelling start?" (e.g., minutes, hours, days)     He was in the hospital recently per wife who is calling in. 2. LOCATION: "What part of the leg is swollen?"  "Are both legs swollen or just one leg?"     Both legs and feet are swollen and painful per wife.  3. SEVERITY: "How bad is the swelling?" (e.g., localized; mild, moderate, severe)  - Localized - small area of swelling localized to one leg  - MILD pedal edema - swelling limited to foot and ankle, pitting edema < 1/4 inch (6 mm) deep, rest and elevation eliminate most or all swelling  - MODERATE edema - swelling of lower leg to knee, pitting edema > 1/4 inch (6 mm) deep, rest and elevation only partially reduce swelling  - SEVERE edema  - swelling extends above knee, facial or hand swelling present      Wife said he is c/o pain and tightness in both of his feet. 4. REDNESS: "Does the swelling look red or infected?"     Not mentioned 5. PAIN: "Is the swelling painful to touch?" If so, ask: "How painful is it?"   (Scale 1-10; mild, moderate or severe)     Yes 6. FEVER: "Do you have a fever?" If so, ask: "What is it, how was it measured, and when did it start?"      Doesn't know 7. CAUSE: "What do you think is causing the leg swelling?"     He has been in the hospital recently with A. Fib and circulation problems. 8. MEDICAL HISTORY: "Do you have a history of heart failure, kidney disease, liver failure, or cancer?"     Heart problems, kidney problems. 9. RECURRENT SYMPTOM: "Have you had leg swelling before?" If so, ask: "When was the last time?" "What happened that time?"     Yes  Been in hospital twice recently per wife. 10. OTHER SYMPTOMS: "Do you have any other symptoms?" (e.g., chest  pain, difficulty breathing)       Denies chest pain or difficulty breathing. 11. PREGNANCY: "Is there any chance you are pregnant?" "When was your last menstrual period?"       N/A  Protocols used: LEG SWELLING AND EDEMA-A-AH

## 2018-03-24 NOTE — ED Notes (Signed)
Pt states that he can not go home because he can not walk and it to weak which is the reason he came to the hospital, states he has a cane and still unable to get up and around the house. MD aware

## 2018-03-24 NOTE — ED Triage Notes (Signed)
Pt to ED from home via [facility] with c/o sharp pain bil feet and legs since yesterday.  Pt is normally able to walk with a cane, st's now he has to use a walker.  No known fluid build up per pt.  C/O pain with ambulation

## 2018-03-24 NOTE — ED Provider Notes (Signed)
[facility] EMERGENCY DEPARTMENT Provider Note   CSN: [id number] Arrival date & time: 03/24/18  [place]     History   Chief Complaint Chief Complaint  Patient presents with  . Leg Pain  . Foot Pain    HPI Jay Chen is a 52 y.o. male.  HPI Patient is a 52 year old male with a past medical history of CKD, CHF, gout, hypertension, sleep apnea, and diabetes who presents to the emergency department for evaluation of bilateral lower extremity pain and reported weakness.  Patient reports that beginning yesterday he began to experience significant pain in his bilateral lower extremity's, mostly in his feet.  Patient reports that secondary to this pain he is felt significantly weak and has had difficulty with ambulation.  Patient denies having previous episodes of similar pain.  Describes the pain as dull and achy in nature, worsened with walking, and intermittently does endorse some tingling in his bilateral feet.  Reports that he was recently admitted secondary to acute volume overload requiring IV diuresis.  Outside of his bilateral lower extremity pain patient has no other symptoms at this time.  Remaining review of systems as below.  Past Medical History:  Diagnosis Date  . CKD (chronic kidney disease), stage III ([place])   . Congestive heart failure [facility]) May of 2011   Felt to have cor pulmonale; EF 45 to 50% from echo in May 2011  . Cor pulmonale (chronic) ([place])   . Gout    "take daily RX" (02/15/2018)  . History of kidney stones   . Hyperlipidemia   . Hypertension   . Morbid obesity ([place])   . Persistent atrial fibrillation    [name] 12/28/2017  . Sleep apnea    "dx'd; couldn't tolerate CPAP" (02/15/2018)  . Thoracic aneurysm    a. 4.8cm thoracic aortic aneurysm by CT 2013.  [name] Thoracic aortic aneurysm ([place])    known/notes 12/28/2017  . Type II diabetes mellitus [facility])     Patient Active Problem List   Diagnosis Date Noted  . Snoring 03/13/2018  . Acute blood  loss anemia   . Hypoalbuminemia due to protein-calorie malnutrition ([place])   . CKD (chronic kidney disease), stage III ([place])   . Chronic combined systolic and diastolic congestive heart failure ([place])   . PAF (paroxysmal atrial fibrillation) ([place])   . Debility 02/23/2018  . CHF (congestive heart failure), NYHA class III ([place]) 02/15/2018  . CKD (chronic kidney disease) stage 4, GFR 15-29 ml/min (HCC)   . Left temporal lobe infarction ([place])   . Morbid obesity ([place])   . Diabetes mellitus with neuropathy ([place])   . Atrial fibrillation, chronic   . Cerebral embolism with cerebral infarction 01/01/2018  . Lumbar radiculopathy   . Cervical spinal cord compression ([place])   . DCM (dilated cardiomyopathy) ([place])   . Atrial fibrillation with rapid ventricular response ([place])   . Chronic combined systolic (congestive) and diastolic (congestive) heart failure ([place])   . Acute on chronic systolic heart failure ([place]) 11/22/2017  . Bilateral edema of lower extremity 10/21/2017  . Pure hyperglyceridemia 09/01/2017  . Phimosis/redundant prepuce 06/03/2015  . Gout of big toe 09/11/2012  . Hyperlipidemia with target LDL less than 100 09/11/2012  . Routine general medical examination at a health care facility 01/19/2012  . Cor pulmonale ([place]) 01/08/2011  . HTN (hypertension) 01/08/2011  . Obesities, morbid ([place]) 01/08/2011  . Aneurysm of thoracic aorta ([place]) 01/08/2011  . Obstructive sleep apnea 08/27/2007  . RHINITIS 08/18/2007  Past Surgical History:  Procedure Laterality Date  . CARDIOVERSION N/A 02/22/2018   Procedure: CARDIOVERSION;  Surgeon: Jolaine Artist, MD;  Location: [facility];  Service: Cardiovascular;  Laterality: N/A;  . CARDIOVERSION N/A 03/01/2018   Procedure: CARDIOVERSION;  Surgeon: Jolaine Artist, MD;  Location: [facility];  Service: Cardiovascular;  Laterality: N/A;  . LAPAROSCOPIC CHOLECYSTECTOMY  2000  . RIGHT/LEFT HEART CATH AND CORONARY ANGIOGRAPHY N/A 02/20/2018    Procedure: RIGHT/LEFT HEART CATH AND CORONARY ANGIOGRAPHY;  Surgeon: Jolaine Artist, MD;  Location: [facility];  Service: Cardiovascular;  Laterality: N/A;  . US ECHOCARDIOGRAPHY  09/21/2009   EF 45-50%; Cavity size was severely dilated, severe concentric hypertrophy and normal wall motion        Home Medications    Prior to Admission medications   Medication Sig Start Date End Date Taking? Authorizing Provider  acetaminophen (TYLENOL) 325 MG tablet Take 1-2 tablets (325-650 mg total) by mouth every 6 (six) hours as needed for mild pain. 03/01/18  Yes Love, Ivan Anchors, PA-C  allopurinol (ZYLOPRIM) 100 MG tablet Take 1 tablet (100 mg total) by mouth daily. 01/16/18  Yes Angiulli, Lavon Paganini, PA-C  amiodarone (PACERONE) 200 MG tablet Take 1 tablet (200 mg total) by mouth daily. 03/13/18  Yes Clegg, Amy D, NP  aspirin 81 MG EC tablet Take 1 tablet (81 mg total) by mouth daily. 02/23/18  Yes Clegg, Amy D, NP  atorvastatin (LIPITOR) 80 MG tablet Take 1 tablet (80 mg total) by mouth daily. 01/16/18  Yes Angiulli, Lavon Paganini, PA-C  hydrALAZINE (APRESOLINE) 100 MG tablet Take 1 tablet (100 mg total) by mouth 3 (three) times daily. 03/13/18  Yes Clegg, Amy D, NP  isosorbide mononitrate (IMDUR) 30 MG 24 hr tablet Take 1 tablet (30 mg total) by mouth daily. 03/01/18  Yes Love, Ivan Anchors, PA-C  metoprolol succinate (TOPROL-XL) 25 MG 24 hr tablet Take 3 tablets (75 mg total) by mouth daily. 03/02/18  Yes Love, Ivan Anchors, PA-C  omega-3 acid ethyl esters (LOVAZA) 1 g capsule Take 2 capsules (2 g total) by mouth 2 (two) times daily. 01/16/18  Yes Angiulli, Lavon Paganini, PA-C  potassium chloride SA (KLOR-CON M20) 20 MEQ tablet Take 3 tablets (60 mEq total) by mouth daily. Patient taking differently: Take 2 mEq by mouth 2 (two) times daily.  03/14/18  Yes Meredith Staggers, MD  rivaroxaban (XARELTO) 20 MG TABS tablet Take 1 tablet (20 mg total) by mouth daily with supper. 01/16/18  Yes Angiulli, Lavon Paganini, PA-C    torsemide (DEMADEX) 20 MG tablet Take 2 tablets (40 mg total) by mouth every morning. May also take 1 tablet (20 mg total) as needed (for weight 343 or greater). 03/13/18  Yes Clegg, Amy D, NP  furosemide (LASIX) 20 MG tablet Take 1 tablet (20 mg total) by mouth daily for 4 days. 03/24/18 03/28/18  Charlisha Market, Chanda Busing, MD    Family History Family History  Problem Relation Age of Onset  . Hypertension Mother   . Pancreatic cancer Father   . Prostate cancer Father   . Colon cancer Father     Social History Social History   Tobacco Use  . Smoking status: Never Smoker  . Smokeless tobacco: Never Used  Substance Use Topics  . Alcohol use: Not Currently    Comment: 02/15/2018 "nothing in the last couple years"  . Drug use: Never     Allergies   Patient has no known allergies.   Review of  Systems Review of Systems  Constitutional: Negative for chills and fever.  HENT: Negative for ear pain and sore throat.   Eyes: Negative for pain and visual disturbance.  Respiratory: Negative for cough and shortness of breath.   Cardiovascular: Positive for leg swelling. Negative for chest pain and palpitations.  Gastrointestinal: Negative for abdominal pain and vomiting.  Genitourinary: Negative for dysuria and hematuria.  Musculoskeletal: Positive for arthralgias and gait problem. Negative for back pain.  Skin: Negative for color change and rash.  Neurological: Negative for seizures and syncope.  All other systems reviewed and are negative.    Physical Exam Updated Vital Signs BP (!) 146/85   Pulse 66   Temp 99 F (37.2 C)   Resp 13   Ht 5\' 8"  (1.727 m)   Wt (!) 151.6 kg   SpO2 92%   BMI 50.82 kg/m   Physical Exam  Constitutional: He appears well-developed and well-nourished.  HENT:  Head: Normocephalic and atraumatic.  Eyes: Conjunctivae are normal.  Neck: Neck supple.  Cardiovascular: Normal rate and regular rhythm.  No murmur heard. Pulmonary/Chest: [place] normal and  breath sounds normal. No respiratory distress.  Abdominal: Soft. There is no tenderness.  Musculoskeletal: He exhibits edema.  Patient with bilateral lower extremity edema.  He has skin changes consistent with venous stasis.  Patient has normal strength testing on my exam.  There is normal perfusion present in bilateral lower extremities.  No overlying erythema present.  No tenderness to palpation in patient's calves or remaining lower extremity.  Reports intact sensation in all fields tested.  Neurological: He is alert.  Skin: Skin is warm and dry.  Psychiatric: He has a normal mood and affect.  Nursing note and vitals reviewed.    ED Treatments / Results  Labs (all labs ordered are listed, but only abnormal results are displayed) Labs Reviewed  CBC WITH DIFFERENTIAL/PLATELET - Abnormal; Notable for the following components:      Result Value   RBC 3.91 (*)    Hemoglobin 12.5 (*)    HCT 38.2 (*)    Monocytes Absolute 1.3 (*)    All other components within normal limits  COMPREHENSIVE METABOLIC PANEL - Abnormal; Notable for the following components:   CO2 35 (*)    Glucose, Bld 112 (*)    BUN 26 (*)    Creatinine, Ser 1.99 (*)    Calcium 8.7 (*)    Albumin 2.8 (*)    Total Bilirubin 1.6 (*)    GFR calc non Af Amer 36 (*)    GFR calc Af Amer 42 (*)    All other components within normal limits  BRAIN NATRIURETIC PEPTIDE - Abnormal; Notable for the following components:   B Natriuretic Peptide 285.1 (*)    All other components within normal limits  URINALYSIS, ROUTINE W REFLEX MICROSCOPIC    EKG None  Radiology Dg Chest Port 1 View  Result Date: 03/24/2018 CLINICAL DATA:  Volume overload.  Bilateral foot swelling EXAM: PORTABLE CHEST 1 VIEW COMPARISON:  02/16/2018 FINDINGS: Cardiac enlargement. Mild vascular congestion without edema. Left lower lobe density compatible with atelectasis and possibly effusion. IMPRESSION: Mild vascular congestion without edema Left lower lobe  consolidation most likely atelectasis. Electronically Signed   By: Franchot Gallo M.D.   On: 03/24/2018 19:20    Procedures Procedures (including critical care time)  Medications Ordered in ED Medications - No data to display   Initial Impression / Assessment and Plan / ED Course  I have reviewed  the triage vital signs and the nursing notes.  Pertinent labs & imaging results that were available during my care of the patient were reviewed by me and considered in my medical decision making (see chart for details).     Patient is a 52 year old male with past medical history as detailed above who presents to the emergency department secondary to bilateral lower extremity pain and difficulty with ambulation.  Secondary to patient's recent history of CHF exacerbation and his reported worsening lower extremity edema multiple laboratory and imaging studies were obtained.  Patient's labs and imaging studies were overall unremarkable.  On exam patient has some edema present on his bilateral extremities as well as changes consistent with venous stasis.  Patient swelling is bilateral and symmetric.  There is no sign of infection.  Patient's strength is normal on my testing.  Given patient's overall reassuring work-up, his stable vital signs, and his overall reassuring physical exam our plan was initially to discharge the patient home with extra doses of a diuretic for the next few days to improve his reported worsening bilateral lower extremity edema.  However at the time of discharge patient stated that he did not feel safe at home as he is unable to ambulate at this time.  As result, the hospitalist service was consulted to evaluate the patient.  After evaluating the patient at bedside the patient will be accepted to their service for inpatient PT/OT in order to help patient with his ambulatory status.  Patient had no further acute events while under my care.   At this time I believe patient's pain may be  multifactorial.  I believe there is some element of edema that is contributing to his pain.  He has bilateral, symmetric swelling making DVT less likely at this time.  There is no evidence of infectious process on exam and patient does not have a significantly elevated white blood cell count.  He has DP pulses present bilaterally making acute arterial occlusion unlikely.  Patient's pain may also be related to his chronic venous stasis given the changes on his skin.  He also does report intermittent shooting/tingling in his feet which may be related to early diabetic neuropathy.  For further information regarding patient's continued hospital course please see admitting team's documentation.  The care of this patient was discussed with my attending physician Dr. Johnney Killian, who voices agreement with work-up and ED disposition.   Final Clinical Impressions(s) / ED Diagnoses   Final diagnoses:  Pain in both lower extremities  Lower extremity edema  Venous stasis of both lower extremities    ED Discharge Orders         Ordered    furosemide (LASIX) 20 MG tablet  Daily     03/24/18 2107           Melina Copa, MD 03/25/18 1501    Charlesetta Shanks, MD 03/30/18 1434

## 2018-03-24 NOTE — ED Provider Notes (Signed)
I saw and evaluated the patient, reviewed the resident's note and I agree with the findings and plan.  EKG: None Reports his feet are about 10% more swollen than is typical for him.  He reports it is painful when he tries to walk.  He reports if he does keep his feet elevated that symptoms are improved.  He takes torsemide.  He denies prior use of compression hose.  No chest pain or shortness of breath.  Is alert and nontoxic.  No respiratory distress at rest.  Lungs clear without gross wheeze rhonchi or rale.  Heart is regular.  Abdomen is obese but soft.  Patient has 2+ pitting edema bilateral feet and ankles.  Calves are soft and nontender.  Patient has some superficial abrasions to the pretibial area but no sign of secondary infection.  At this time, patient may try a few days of Lasix and elevating for what appears consistent with venous stasis.  Recommend patient follow-up with his PCP and wound care clinic for evaluation of compression dressings and venous stasis.     Charlesetta Shanks, MD 03/24/18 2046

## 2018-03-24 NOTE — Care Management (Signed)
ED CM reviewed patient's record and noted patient was recently discharged from CIR with [facility] services. CM met with patient at bedside patient verified information. Patient reports having difficulty bearing weight or walking since Thursday patient is active with [facility] service, Patient reports he believed he was making progress until Thursday. Updated EDP Resident consult placed for Hospitalist to evaluate further.

## 2018-03-25 ENCOUNTER — Encounter (HOSPITAL_COMMUNITY): Payer: BC Managed Care – PPO

## 2018-03-25 DIAGNOSIS — M79671 Pain in right foot: Secondary | ICD-10-CM | POA: Diagnosis not present

## 2018-03-25 DIAGNOSIS — I482 Chronic atrial fibrillation, unspecified: Secondary | ICD-10-CM | POA: Diagnosis not present

## 2018-03-25 DIAGNOSIS — M79672 Pain in left foot: Secondary | ICD-10-CM | POA: Diagnosis not present

## 2018-03-25 DIAGNOSIS — M79604 Pain in right leg: Secondary | ICD-10-CM

## 2018-03-25 DIAGNOSIS — I5042 Chronic combined systolic (congestive) and diastolic (congestive) heart failure: Secondary | ICD-10-CM | POA: Diagnosis not present

## 2018-03-25 DIAGNOSIS — I10 Essential (primary) hypertension: Secondary | ICD-10-CM | POA: Diagnosis not present

## 2018-03-25 DIAGNOSIS — N183 Chronic kidney disease, stage 3 (moderate): Secondary | ICD-10-CM | POA: Diagnosis not present

## 2018-03-25 DIAGNOSIS — M79605 Pain in left leg: Secondary | ICD-10-CM

## 2018-03-25 LAB — GLUCOSE, CAPILLARY
Glucose-Capillary: 81 mg/dL (ref 70–99)
Glucose-Capillary: 94 mg/dL (ref 70–99)
Glucose-Capillary: 99 mg/dL (ref 70–99)

## 2018-03-25 MED ORDER — HYDROXYZINE HCL 25 MG PO TABS
25.0000 mg | ORAL_TABLET | Freq: Three times a day (TID) | ORAL | Status: DC | PRN
  Administered 2018-03-25: 25 mg via ORAL
  Filled 2018-03-25 (×2): qty 1

## 2018-03-25 MED ORDER — OMEGA-3-ACID ETHYL ESTERS 1 G PO CAPS
2.0000 g | ORAL_CAPSULE | Freq: Two times a day (BID) | ORAL | Status: DC
  Administered 2018-03-25 – 2018-03-28 (×7): 2 g via ORAL
  Filled 2018-03-25 (×6): qty 2

## 2018-03-25 MED ORDER — TORSEMIDE 20 MG PO TABS
40.0000 mg | ORAL_TABLET | Freq: Every day | ORAL | Status: DC
  Administered 2018-03-25 – 2018-03-28 (×4): 40 mg via ORAL
  Filled 2018-03-25 (×4): qty 2

## 2018-03-25 MED ORDER — METOPROLOL SUCCINATE ER 50 MG PO TB24
75.0000 mg | ORAL_TABLET | Freq: Every day | ORAL | Status: DC
  Administered 2018-03-25 – 2018-03-28 (×4): 75 mg via ORAL
  Filled 2018-03-25 (×4): qty 1

## 2018-03-25 MED ORDER — ACETAMINOPHEN 650 MG RE SUPP: 650.0000 mg | Freq: Four times a day (QID) | RECTAL | Status: DC | PRN

## 2018-03-25 MED ORDER — ONDANSETRON HCL 4 MG/2ML IJ SOLN: 4.0000 mg | Freq: Four times a day (QID) | INTRAMUSCULAR | Status: DC | PRN

## 2018-03-25 MED ORDER — POTASSIUM CHLORIDE CRYS ER 20 MEQ PO TBCR
60.0000 meq | EXTENDED_RELEASE_TABLET | Freq: Every day | ORAL | Status: DC
  Administered 2018-03-25 – 2018-03-28 (×4): 60 meq via ORAL
  Filled 2018-03-25 (×4): qty 3

## 2018-03-25 MED ORDER — ATORVASTATIN CALCIUM 80 MG PO TABS
80.0000 mg | ORAL_TABLET | Freq: Every day | ORAL | Status: DC
  Administered 2018-03-25 – 2018-03-28 (×4): 80 mg via ORAL
  Filled 2018-03-25 (×4): qty 1

## 2018-03-25 MED ORDER — ACETAMINOPHEN 325 MG PO TABS
325.0000 mg | ORAL_TABLET | Freq: Four times a day (QID) | ORAL | Status: DC | PRN
  Filled 2018-03-25: qty 2

## 2018-03-25 MED ORDER — ALLOPURINOL 100 MG PO TABS
100.0000 mg | ORAL_TABLET | Freq: Every day | ORAL | Status: DC
  Administered 2018-03-25 – 2018-03-28 (×4): 100 mg via ORAL
  Filled 2018-03-25 (×4): qty 1

## 2018-03-25 MED ORDER — HYDRALAZINE HCL 50 MG PO TABS
100.0000 mg | ORAL_TABLET | Freq: Three times a day (TID) | ORAL | Status: DC
  Administered 2018-03-25 – 2018-03-28 (×10): 100 mg via ORAL
  Filled 2018-03-25 (×10): qty 2

## 2018-03-25 MED ORDER — ASPIRIN 81 MG PO CHEW
81.0000 mg | CHEWABLE_TABLET | Freq: Every day | ORAL | Status: DC
  Administered 2018-03-25 – 2018-03-28 (×4): 81 mg via ORAL
  Filled 2018-03-25 (×4): qty 1

## 2018-03-25 MED ORDER — ACETAMINOPHEN 325 MG PO TABS
650.0000 mg | ORAL_TABLET | Freq: Four times a day (QID) | ORAL | Status: DC | PRN
  Administered 2018-03-25 (×2): 650 mg via ORAL
  Filled 2018-03-25: qty 2

## 2018-03-25 MED ORDER — ISOSORBIDE MONONITRATE ER 30 MG PO TB24
30.0000 mg | ORAL_TABLET | Freq: Every day | ORAL | Status: DC
  Administered 2018-03-25 – 2018-03-28 (×4): 30 mg via ORAL
  Filled 2018-03-25 (×4): qty 1

## 2018-03-25 MED ORDER — RIVAROXABAN 20 MG PO TABS
20.0000 mg | ORAL_TABLET | Freq: Every day | ORAL | Status: DC
  Administered 2018-03-25 – 2018-03-27 (×4): 20 mg via ORAL
  Filled 2018-03-25 (×5): qty 1

## 2018-03-25 MED ORDER — AMIODARONE HCL 100 MG PO TABS
200.0000 mg | ORAL_TABLET | Freq: Every day | ORAL | Status: DC
  Administered 2018-03-25 – 2018-03-28 (×4): 200 mg via ORAL
  Filled 2018-03-25 (×4): qty 2

## 2018-03-25 MED ORDER — ONDANSETRON HCL 4 MG PO TABS: 4.0000 mg | ORAL_TABLET | Freq: Four times a day (QID) | ORAL | Status: DC | PRN

## 2018-03-25 NOTE — Evaluation (Signed)
Physical Therapy Evaluation Patient Details Name: Jay Chen MRN: [id number] DOB: 1965-12-14 Today's Date: 03/25/2018   History of Present Illness  52 yo male admitted for B foot pain and severe edema and was referred to PT for mobility assessment.  Has 8-10 pain standing on LLE and 7-8 pain standing on RLE.  PMHx:  CHF with volume overload, DCCV 10/2, AAA, CVA, HTN, DM, CKD 3, EF 20-25%, morbid obesity, a-fib, cor pulmonale,   Clinical Impression  Pt was able to assist to side of bed and assisted him to stand at [facility].  When attempting steps he was too sensitive on ankles to try.  He is not having any symptoms of plantar fasciitis or other structural issue on feet and ankles, but instead very much warmth and effusion on B ankles.  Pt is esp painful on L ankle and cannot take a step with RW.  Requesting rehab as he is home alone and not able to transfer alone.  Will follow acutely for progression of mobility to increase standing and stair climbing to return home ASAP if he is unable to gain approval for SNF.    Follow Up Recommendations SNF    Equipment Recommendations  Rolling walker with 5" wheels    Recommendations for Other Services       Precautions / Restrictions Precautions Precautions: Fall Precaution Comments: home alone during the day Restrictions Weight Bearing Restrictions: No      Mobility  Bed Mobility Overal bed mobility: Needs Assistance Bed Mobility: Supine to Sit;Sit to Supine     Supine to sit: Min assist(to support trunk up ) Sit to supine: Min assist(to get legs onto bed)      Transfers Overall transfer level: Needs assistance Equipment used: 1 person hand held assist;Rolling walker (2 wheeled) Transfers: Sit to/from Stand Sit to Stand: From elevated surface;Min assist         General transfer comment: pt is able to assist and does better once up standing   Ambulation/Gait Ambulation/Gait assistance: (unable to walk)           General Gait  Details: could not tolerate pain in ankles to step  Stairs            Wheelchair Mobility    Modified Rankin (Stroke Patients Only)       Balance Overall balance assessment: Needs assistance Sitting-balance support: Feet supported;Bilateral upper extremity supported Sitting balance-Leahy Scale: Good     Standing balance support: Bilateral upper extremity supported;During functional activity Standing balance-Leahy Scale: Fair Standing balance comment: fair balance but could not step due to pain only                             Pertinent Vitals/Pain Pain Assessment: 0-10 Pain Score: 10-Worst pain ever Pain Location: L ankle, R ankle 7-8 Pain Descriptors / Indicators: Grimacing;Tightness Pain Intervention(s): Limited activity within patient's tolerance;Monitored during session;Repositioned;Premedicated before session    Home Living Family/patient expects to be discharged to:: Private residence Living Arrangements: Spouse/significant other Available Help at Discharge: Family;Available PRN/intermittently Type of Home: House Home Access: Stairs to enter Entrance Stairs-Rails: Can reach both Entrance Stairs-Number of Steps: 3 Home Layout: Two level;Bed/bath upstairs;Able to live on main level with bedroom/bathroom Home Equipment: Walker - 4 wheels;Shower seat;Adaptive equipment Additional Comments: has children but all gone during the day, has BSC and bench for tub    Prior Function Level of Independence: Independent with assistive device(s)  Comments: walks with rollator, takes care of the house and enjoys driving kids to activities      Hand Dominance   Dominant Hand: Right    Extremity/Trunk Assessment   Upper Extremity Assessment Upper Extremity Assessment: Overall WFL for tasks assessed    Lower Extremity Assessment Lower Extremity Assessment: Overall WFL for tasks assessed(pain prevents stepping but mm strength is WFL LE's)     Cervical / Trunk Assessment Cervical / Trunk Assessment: Normal  Communication   Communication: No difficulties  Cognition Arousal/Alertness: Awake/alert Behavior During Therapy: WFL for tasks assessed/performed Overall Cognitive Status: Within Functional Limits for tasks assessed                                        General Comments General comments (skin integrity, edema, etc.): pt has edema on B ankles, with pain to move in any direction and notable warmth to skin overlying.  Has no strength changes of legs, but is specifically painful to joints and not mm or other structural regions.      Exercises     Assessment/Plan    PT Assessment Patient needs continued PT services  PT Problem List Decreased range of motion;Decreased activity tolerance;Decreased balance;Decreased mobility;Decreased coordination;Decreased knowledge of use of DME;Decreased safety awareness;Obesity;Decreased skin integrity;Pain       PT Treatment Interventions DME instruction;Gait training;Functional mobility training;Stair training;Therapeutic activities;Therapeutic exercise;Balance training;Neuromuscular re-education;Patient/family education    PT Goals (Current goals can be found in the Care Plan section)  Acute Rehab PT Goals Patient Stated Goal: to get home and reduce pain PT Goal Formulation: With patient Time For Goal Achievement: 04/08/18 Potential to Achieve Goals: Good    Frequency Min 2X/week   Barriers to discharge Inaccessible home environment;Decreased caregiver support home alone with 3 steps to enter house and no family during the day    Co-evaluation               AM-PAC PT "6 Clicks" Daily Activity  Outcome Measure Difficulty turning over in bed (including adjusting bedclothes, sheets and blankets)?: A Little Difficulty moving from lying on back to sitting on the side of the bed? : Unable Difficulty sitting down on and standing up from a chair with arms  (e.g., wheelchair, bedside commode, etc,.)?: Unable Help needed moving to and from a bed to chair (including a wheelchair)?: A Little Help needed walking in hospital room?: Total Help needed climbing 3-5 steps with a railing? : Total 6 Click Score: 10    End of Session Equipment Utilized During Treatment: Gait belt Activity Tolerance: Patient limited by fatigue;Patient limited by pain Patient left: in bed;with call bell/phone within reach Nurse Communication: Mobility status;Other (comment)(talked with nursing about follow up) PT Visit Diagnosis: Unsteadiness on feet (R26.81);Pain;Difficulty in walking, not elsewhere classified (R26.2) Pain - Right/Left: (Bilateral) Pain - part of body: Ankle and joints of foot    Time: 5643-3295 PT Time Calculation (min) (ACUTE ONLY): 20 min   Charges:   PT Evaluation $PT Eval Moderate Complexity: 1 Mod         Ramond Dial 03/25/2018, 12:37 PM   Mee Hives, PT MS Acute Rehab Dept. Number: [place] and [place]

## 2018-03-25 NOTE — Plan of Care (Signed)

## 2018-03-25 NOTE — Plan of Care (Signed)
  Problem: Education: Goal: Knowledge of General Education information will improve Description Including pain rating scale, medication(s)/side effects and non-pharmacologic comfort measures Outcome: Progressing   Problem: Health Behavior/Discharge Planning: Goal: Ability to manage health-related needs will improve Outcome: Progressing   Problem: Clinical Measurements: Goal: Respiratory complications will improve Outcome: Progressing   Problem: Activity: Goal: Risk for activity intolerance will decrease Outcome: Progressing   Problem: Elimination: Goal: Will not experience complications related to bowel motility Outcome: Progressing Goal: Will not experience complications related to urinary retention Outcome: Progressing   Problem: Safety: Goal: Ability to remain free from injury will improve Outcome: Progressing

## 2018-03-25 NOTE — H&P (Signed)
History and Physical    Jay Chen WNU:[id number] DOB: 11-17-1965 DOA: 03/24/2018  PCP: [name], MD  Patient coming from: Home  I have personally briefly reviewed patient's old medical records in [place]  Chief Complaint: Foot pain  HPI: Jay Chen is a 52 y.o. male with medical history significant of CHF, CKD stage 3, HTN, A.Fib on Xarelto.  Patient presents to the ED with c/o B foot pain.  Pain in ankles and bottom of B feet.  Worse when he tries to ambulate, better at rest.  Swelling is actually baseline currently.  No wt gain (actually wt is down 3kg over past 10 days since before this started).  No SOB, worse DOE.  Pain dull and achy in nature.   ED Course: BNP 285, CXR neg, Creat 1.99 (about baseline).   Review of Systems: As per HPI otherwise 10 point review of systems negative.   Past Medical History:  Diagnosis Date  . CKD (chronic kidney disease), stage III ([place])   . Congestive heart failure [facility]) May of 2011   Felt to have cor pulmonale; EF 45 to 50% from echo in May 2011  . Cor pulmonale (chronic) ([place])   . Gout    "take daily RX" (02/15/2018)  . History of kidney stones   . Hyperlipidemia   . Hypertension   . Morbid obesity ([place])   . Persistent atrial fibrillation    [name] 12/28/2017  . Sleep apnea    "dx'd; couldn't tolerate CPAP" (02/15/2018)  . Thoracic aneurysm    a. 4.8cm thoracic aortic aneurysm by CT 2013.  [name] Thoracic aortic aneurysm ([place])    known/notes 12/28/2017  . Type II diabetes mellitus ([place])     Past Surgical History:  Procedure Laterality Date  . CARDIOVERSION N/A 02/22/2018   Procedure: CARDIOVERSION;  Surgeon: Jolaine Artist, MD;  Location: [facility] ENDOSCOPY;  Service: Cardiovascular;  Laterality: N/A;  . CARDIOVERSION N/A 03/01/2018   Procedure: CARDIOVERSION;  Surgeon: Jolaine Artist, MD;  Location: [place];  Service: Cardiovascular;  Laterality: N/A;  . LAPAROSCOPIC CHOLECYSTECTOMY  2000  . RIGHT/LEFT  HEART CATH AND CORONARY ANGIOGRAPHY N/A 02/20/2018   Procedure: RIGHT/LEFT HEART CATH AND CORONARY ANGIOGRAPHY;  Surgeon: Jolaine Artist, MD;  Location: [facility] CV LAB;  Service: Cardiovascular;  Laterality: N/A;  . US ECHOCARDIOGRAPHY  09/21/2009   EF 45-50%; Cavity size was severely dilated, severe concentric hypertrophy and normal wall motion     reports that he has never smoked. He has never used smokeless tobacco. He reports that he drank alcohol. He reports that he does not use drugs.  No Known Allergies  Family History  Problem Relation Age of Onset  . Hypertension Mother   . Pancreatic cancer Father   . Prostate cancer Father   . Colon cancer Father      Prior to Admission medications   Medication Sig Start Date End Date Taking? Authorizing Provider  acetaminophen (TYLENOL) 325 MG tablet Take 1-2 tablets (325-650 mg total) by mouth every 6 (six) hours as needed for mild pain. 03/01/18  Yes Love, Ivan Anchors, PA-C  allopurinol (ZYLOPRIM) 100 MG tablet Take 1 tablet (100 mg total) by mouth daily. 01/16/18  Yes Angiulli, Lavon Paganini, PA-C  amiodarone (PACERONE) 200 MG tablet Take 1 tablet (200 mg total) by mouth daily. 03/13/18  Yes Clegg, Amy D, NP  aspirin 81 MG EC tablet Take 1 tablet (81 mg total) by mouth daily. 02/23/18  Yes Clegg,  Amy D, NP  atorvastatin (LIPITOR) 80 MG tablet Take 1 tablet (80 mg total) by mouth daily. 01/16/18  Yes Angiulli, Lavon Paganini, PA-C  hydrALAZINE (APRESOLINE) 100 MG tablet Take 1 tablet (100 mg total) by mouth 3 (three) times daily. 03/13/18  Yes Clegg, Amy D, NP  isosorbide mononitrate (IMDUR) 30 MG 24 hr tablet Take 1 tablet (30 mg total) by mouth daily. 03/01/18  Yes Love, Ivan Anchors, PA-C  metoprolol succinate (TOPROL-XL) 25 MG 24 hr tablet Take 3 tablets (75 mg total) by mouth daily. 03/02/18  Yes Love, Ivan Anchors, PA-C  omega-3 acid ethyl esters (LOVAZA) 1 g capsule Take 2 capsules (2 g total) by mouth 2 (two) times daily. 01/16/18  Yes Angiulli, Lavon Paganini, PA-C  potassium chloride SA (KLOR-CON M20) 20 MEQ tablet Take 3 tablets (60 mEq total) by mouth daily. Patient taking differently: Take 2 mEq by mouth 2 (two) times daily.  03/14/18  Yes Meredith Staggers, MD  rivaroxaban (XARELTO) 20 MG TABS tablet Take 1 tablet (20 mg total) by mouth daily with supper. 01/16/18  Yes Angiulli, Lavon Paganini, PA-C  torsemide (DEMADEX) 20 MG tablet Take 2 tablets (40 mg total) by mouth every morning. May also take 1 tablet (20 mg total) as needed (for weight 343 or greater). 03/13/18  Yes Clegg, Amy D, NP  furosemide (LASIX) 20 MG tablet Take 1 tablet (20 mg total) by mouth daily for 4 days. 03/24/18 03/28/18  Melina Copa, MD    Physical Exam: Vitals:   03/24/18 2345 03/25/18 0000 03/25/18 0030 03/25/18 0051  BP: (!) 157/76 (!) 147/87 (!) 166/81   Pulse: 72 80 65   Resp: 17 14 13    Temp:      SpO2: 92% 92% 92%   Weight:    (!) 151.6 kg  Height:    5\' 8"  (1.727 m)    Constitutional: NAD, calm, comfortable Eyes: PERRL, lids and conjunctivae normal ENMT: Mucous membranes are moist. Posterior pharynx clear of any exudate or lesions.Normal dentition.  Neck: normal, supple, no masses, no thyromegaly Respiratory: clear to auscultation bilaterally, no wheezing, no crackles. Normal respiratory effort. No accessory muscle use.  Cardiovascular: Regular rate and rhythm, no murmurs / rubs / gallops. No extremity edema. 2+ pedal pulses. No carotid bruits.  Abdomen: no tenderness, no masses palpated. No hepatosplenomegaly. Bowel sounds positive.  Musculoskeletal: no clubbing / cyanosis. No joint deformity upper and lower extremities. Good ROM, no contractures. Normal muscle tone.  Skin: no rashes, lesions, ulcers. No induration Neurologic: CN 2-12 grossly intact. Sensation intact, DTR normal. Strength 5/5 in all 4.  Psychiatric: Normal judgment and insight. Alert and oriented x 3. Normal mood.    Labs on Admission: I have personally reviewed following labs and  imaging studies  CBC: Recent Labs  Lab 03/24/18 1812  WBC 8.8  NEUTROABS 5.9  HGB 12.5*  HCT 38.2*  MCV 97.7  PLT 710   Basic Metabolic Panel: Recent Labs  Lab 03/20/18 1141 03/24/18 1812  NA 144 139  K 3.9 3.7  CL 103 98  CO2 32 35*  GLUCOSE 91 112*  BUN 26* 26*  CREATININE 1.78* 1.99*  CALCIUM 8.9 8.7*   GFR: Estimated Creatinine Clearance: 61 mL/min (A) (by C-G formula based on SCr of 1.99 mg/dL (H)). Liver Function Tests: Recent Labs  Lab 03/24/18 1812  AST 19  ALT 24  ALKPHOS 82  BILITOT 1.6*  PROT 6.7  ALBUMIN 2.8*   No results for input(s): LIPASE,  AMYLASE in the last 168 hours. No results for input(s): AMMONIA in the last 168 hours. Coagulation Profile: No results for input(s): INR, PROTIME in the last 168 hours. Cardiac Enzymes: No results for input(s): CKTOTAL, CKMB, CKMBINDEX, TROPONINI in the last 168 hours. BNP (last 3 results) No results for input(s): PROBNP in the last 8760 hours. HbA1C: No results for input(s): HGBA1C in the last 72 hours. CBG: No results for input(s): GLUCAP in the last 168 hours. Lipid Profile: No results for input(s): CHOL, HDL, LDLCALC, TRIG, CHOLHDL, LDLDIRECT in the last 72 hours. Thyroid Function Tests: No results for input(s): TSH, T4TOTAL, FREET4, T3FREE, THYROIDAB in the last 72 hours. Anemia Panel: No results for input(s): VITAMINB12, FOLATE, FERRITIN, TIBC, IRON, RETICCTPCT in the last 72 hours. Urine analysis:    Component Value Date/Time   COLORURINE YELLOW 03/24/2018 1926   APPEARANCEUR CLEAR 03/24/2018 1926   LABSPEC 1.009 03/24/2018 1926   PHURINE 7.0 03/24/2018 1926   GLUCOSEU NEGATIVE 03/24/2018 1926   GLUCOSEU NEGATIVE 09/01/2017 1357   HGBUR NEGATIVE 03/24/2018 [place] NEGATIVE 03/24/2018 [place] NEGATIVE 03/24/2018 1926   PROTEINUR NEGATIVE 03/24/2018 1926   UROBILINOGEN 1.0 09/01/2017 1357   NITRITE NEGATIVE 03/24/2018 1926   LEUKOCYTESUR NEGATIVE 03/24/2018 1926     Radiological Exams on Admission: Dg Chest Port 1 View  Result Date: 03/24/2018 CLINICAL DATA:  Volume overload.  Bilateral foot swelling EXAM: PORTABLE CHEST 1 VIEW COMPARISON:  02/16/2018 FINDINGS: Cardiac enlargement. Mild vascular congestion without edema. Left lower lobe density compatible with atelectasis and possibly effusion. IMPRESSION: Mild vascular congestion without edema Left lower lobe consolidation most likely atelectasis. Electronically Signed   By: Franchot Gallo M.D.   On: 03/24/2018 19:20    EKG: Independently reviewed.  Assessment/Plan Principal Problem:   Bilateral foot pain Active Problems:   HTN (hypertension)   Chronic combined systolic (congestive) and diastolic (congestive) heart failure (HCC)   Atrial fibrillation, chronic   CKD (chronic kidney disease), stage III (HCC)    1. B foot pain - DDx includes plantar fasciitis, ischemic pain of feet, diabetic neuropathy, among others. 1. PT/OT 2. Arterial duplex BLE 3. Tylenol PRN pain 4. Consider neurontin, but not really striking me as neuropathic.  Plantar fasciitis seems more likely. 5. Cant do NSAIDs due to CKD 2. CKD stage 3 - chronic and stable 3. A.Fib - 1. Continue Xarelto 2. Continue toprol, amiodarone 4. HTN - continue home BP meds 5. CHF - continue torsemide  DVT prophylaxis: Xarelto Code Status: Full Family Communication: Wife at bedside Disposition Plan: TBD Consults called: None Admission status: Place in [place], [place] [facility] Pager [phone] Only works nights!  If 7AM-7PM, please contact the primary day team physician taking care of patient  [email or web address] Password [email or web address]  03/25/2018, 1:30 AM

## 2018-03-25 NOTE — Progress Notes (Signed)
PROGRESS NOTE    Jay Chen  YTK:[id number] DOB: 04-15-1966 DOA: 03/24/2018 PCP: [name], MD    Brief Narrative:  52 y.o. male with medical history significant of CHF, CKD stage 3, HTN, A.Fib on Xarelto.  Patient presents to the ED with c/o B foot pain.  Pain in ankles and bottom of B feet.  Worse when he tries to ambulate, better at rest.  Swelling is actually baseline currently.  No wt gain (actually wt is down 3kg over past 10 days since before this started).  No SOB, worse DOE.  Pain dull and achy in nature.   ED Course: BNP 285, CXR neg, Creat 1.99   Assessment & Plan:   Principal Problem:   Bilateral foot pain Active Problems:   HTN (hypertension)   Chronic combined systolic (congestive) and diastolic (congestive) heart failure (HCC)   Atrial fibrillation, chronic   CKD (chronic kidney disease), stage III (HCC)   1. B foot pain - DDx includes plantar fasciitis, ischemic pain of feet, diabetic neuropathy, among others. 1. PT/OT consulted, recommendations for SNF. Have placed SW consult and discussed with SW 2. Arterial duplex BLE was ordered, pending 3. Continued with tylenol PRN 4. Also recommend ice packs PRN 5. Presently symptoms improved after rest overnight 6. Would avoid NSAIDs due to CKD 2. CKD stage 3 - chronic and stable 1. Avoid nephrotoxic agents 2. Repeat bmet in AM 3. A.Fib - 1. Continue Xarelto as tolerated 2. Continue toprol, amiodarone 3. Rate controlled at present 4. HTN  1. continue home BP meds 2. Stable at present 5. CHF 1. continue torsemide 2. Euvolemic at present  DVT prophylaxis: Xarelto Code Status: Full Family Communication: Pt in room, family not at bedside Disposition Plan: SNF recommended, pending  Consultants:     Procedures:     Antimicrobials: Anti-infectives (From admission, onward)   None       Subjective: Reports feet feel improved this AM  Objective: Vitals:   03/25/18 0051 03/25/18 0147  03/25/18 0511 03/25/18 1523  BP:  (!) 161/83 (!) 162/95 140/84  Pulse:  73 78 74  Resp:  12  17  Temp:   99.5 F (37.5 C) 98.9 F (37.2 C)  TempSrc:   Oral Oral  SpO2:  92% 92% 90%  Weight: (!) 151.6 kg     Height: 5\' 8"  (1.727 m)       Intake/Output Summary (Last 24 hours) at 03/25/2018 1600 Last data filed at 03/25/2018 0800 Gross per 24 hour  Intake 240 ml  Output 300 ml  Net -60 ml   Filed Weights   03/25/18 0051  Weight: (!) 151.6 kg    Examination:  General exam: Appears calm and comfortable  Respiratory system: Clear to auscultation. Respiratory effort normal. Cardiovascular system: S1 & S2 heard, RRR. Gastrointestinal system: Abdomen is nondistended, soft and nontender. No organomegaly or masses felt. Normal bowel sounds heard. Central nervous system: Alert and oriented. No focal neurological deficits. Extremities: Symmetric 5 x 5 power. Skin: No rashes, lesions Psychiatry: Judgement and insight appear normal. Mood & affect appropriate.   Data Reviewed: I have personally reviewed following labs and imaging studies  CBC: Recent Labs  Lab 03/24/18 1812  WBC 8.8  NEUTROABS 5.9  HGB 12.5*  HCT 38.2*  MCV 97.7  PLT 557   Basic Metabolic Panel: Recent Labs  Lab 03/20/18 1141 03/24/18 1812  NA 144 139  K 3.9 3.7  CL 103 98  CO2 32 35*  GLUCOSE  91 112*  BUN 26* 26*  CREATININE 1.78* 1.99*  CALCIUM 8.9 8.7*   GFR: Estimated Creatinine Clearance: 61 mL/min (A) (by C-G formula based on SCr of 1.99 mg/dL (H)). Liver Function Tests: Recent Labs  Lab 03/24/18 1812  AST 19  ALT 24  ALKPHOS 82  BILITOT 1.6*  PROT 6.7  ALBUMIN 2.8*   No results for input(s): LIPASE, AMYLASE in the last 168 hours. No results for input(s): AMMONIA in the last 168 hours. Coagulation Profile: No results for input(s): INR, PROTIME in the last 168 hours. Cardiac Enzymes: No results for input(s): CKTOTAL, CKMB, CKMBINDEX, TROPONINI in the last 168 hours. BNP (last 3  results) No results for input(s): PROBNP in the last 8760 hours. HbA1C: No results for input(s): HGBA1C in the last 72 hours. CBG: Recent Labs  Lab 03/25/18 0222 03/25/18 0646  GLUCAP 81 94   Lipid Profile: No results for input(s): CHOL, HDL, LDLCALC, TRIG, CHOLHDL, LDLDIRECT in the last 72 hours. Thyroid Function Tests: No results for input(s): TSH, T4TOTAL, FREET4, T3FREE, THYROIDAB in the last 72 hours. Anemia Panel: No results for input(s): VITAMINB12, FOLATE, FERRITIN, TIBC, IRON, RETICCTPCT in the last 72 hours. Sepsis Labs: No results for input(s): PROCALCITON, LATICACIDVEN in the last 168 hours.  No results found for this or any previous visit (from the past 240 hour(s)).   Radiology Studies: Dg Chest Port 1 View  Result Date: 03/24/2018 CLINICAL DATA:  Volume overload.  Bilateral foot swelling EXAM: PORTABLE CHEST 1 VIEW COMPARISON:  02/16/2018 FINDINGS: Cardiac enlargement. Mild vascular congestion without edema. Left lower lobe density compatible with atelectasis and possibly effusion. IMPRESSION: Mild vascular congestion without edema Left lower lobe consolidation most likely atelectasis. Electronically Signed   By: Franchot Gallo M.D.   On: 03/24/2018 19:20    Scheduled Meds: . allopurinol  100 mg Oral Daily  . amiodarone  200 mg Oral Daily  . aspirin  81 mg Oral Daily  . atorvastatin  80 mg Oral Daily  . hydrALAZINE  100 mg Oral TID  . isosorbide mononitrate  30 mg Oral Daily  . metoprolol succinate  75 mg Oral Daily  . omega-3 acid ethyl esters  2 g Oral BID  . potassium chloride SA  60 mEq Oral Daily  . rivaroxaban  20 mg Oral Q supper  . torsemide  40 mg Oral Daily   Continuous Infusions:   LOS: 0 days   Marylu Lund, MD [facility] Pager On Amion  If 7PM-7AM, please contact night-coverage 03/25/2018, 4:00 PM

## 2018-03-25 NOTE — Evaluation (Signed)
Occupational Therapy Evaluation Patient Details Name: Jay Chen MRN: [id number] DOB: 12-28-1965 Today's Date: 03/25/2018    History of Present Illness 52 yo male admitted for B foot pain and severe edema and was referred to PT for mobility assessment.  Has 8-10 pain standing on LLE and 7-8 pain standing on RLE.  PMHx:  CHF with volume overload, DCCV 10/2, AAA, CVA, HTN, DM, CKD 3, EF 20-25%, morbid obesity, a-fib, cor pulmonale,    Clinical Impression   PTA, pt was living with his wife and two children and was independent with ADLs and IADLs. Pt was using rollator for mobility. Pt currently requiring Min A for LB ADLs and Min A for functional transfers with RW. Pt presenting with significant pain at bilateral ankles impacting his functional performance and mobility. Pt requiring increased time to perform pivot towards recliner. Pt will require further acute OT to facilitate safe dc. Pending pt progress, recommend dc to SNF for further OT to optimize safety and independence with ADLs and functional mobility.     Follow Up Recommendations  SNF;Supervision/Assistance - 24 hour    Equipment Recommendations  None recommended by OT    Recommendations for Other Services PT consult     Precautions / Restrictions Precautions Precautions: Fall Precaution Comments: home alone during the day Restrictions Weight Bearing Restrictions: No      Mobility Bed Mobility Overal bed mobility: Needs Assistance Bed Mobility: Supine to Sit     Supine to sit: Min guard;HOB elevated Sit to supine: Min assist(to get legs onto bed)   General bed mobility comments: MIn GUard A for safety  Transfers Overall transfer level: Needs assistance Equipment used: Rolling walker (2 wheeled) Transfers: Sit to/from Stand Sit to Stand: Min assist         General transfer comment: Min A to power up into standing. VS for hand placement    Balance Overall balance assessment: Needs  assistance Sitting-balance support: Feet supported;Bilateral upper extremity supported Sitting balance-Leahy Scale: Good     Standing balance support: Bilateral upper extremity supported;During functional activity Standing balance-Leahy Scale: Poor Standing balance comment: Reliant on UE support                           ADL either performed or assessed with clinical judgement   ADL Overall ADL's : Needs assistance/impaired Eating/Feeding: Independent;Sitting   Grooming: Set up;Supervision/safety;Sitting   Upper Body Bathing: Sitting;Set up;Supervision/ safety   Lower Body Bathing: Minimal assistance;Sit to/from stand   Upper Body Dressing : Set up;Sitting   Lower Body Dressing: Minimal assistance;Sit to/from stand   Toilet Transfer: Minimal assistance;Stand-pivot;RW(Simulated to recliner)           Functional mobility during ADLs: Minimal assistance;Rolling walker(stand pivot only) General ADL Comments: Pt presenting with pain at BLEs limited his functional performance. Pt with difficulty weight bearing through LLE. ABle to maintain standing with RW to releave pain.      Vision         Perception     Praxis      Pertinent Vitals/Pain Pain Assessment: Faces Pain Score: 10-Worst pain ever Faces Pain Scale: Hurts whole lot Pain Location: L ankle, R ankle 7-8 Pain Descriptors / Indicators: Grimacing;Tightness Pain Intervention(s): Monitored during session;Limited activity within patient's tolerance;Repositioned     Hand Dominance Right   Extremity/Trunk Assessment Upper Extremity Assessment Upper Extremity Assessment: Overall WFL for tasks assessed   Lower Extremity Assessment Lower Extremity Assessment: Overall WFL for  tasks assessed(Bilateral pain at ankles/feet)   Cervical / Trunk Assessment Cervical / Trunk Assessment: Other exceptions Cervical / Trunk Exceptions: Increased body habitus   Communication Communication Communication: No  difficulties   Cognition Arousal/Alertness: Awake/alert Behavior During Therapy: WFL for tasks assessed/performed Overall Cognitive Status: Within Functional Limits for tasks assessed                                     General Comments  Edema at [place].    Exercises Exercises: Other exercises(LE strength WFL BLE's with care to avoid painful ankle edema)   Shoulder Instructions      Home Living Family/patient expects to be discharged to:: Private residence Living Arrangements: Spouse/significant other;Children(Wife and two teenagers) Available Help at Discharge: Family;Available PRN/intermittently Type of Home: House Home Access: Stairs to enter [employer] of Steps: 3 Entrance Stairs-Rails: Can reach both Home Layout: Two level;Bed/bath upstairs;Able to live on main level with bedroom/bathroom Alternate Level Stairs-Number of Steps: 20 Alternate Level Stairs-Rails: Right Bathroom Shower/Tub: Tub/shower unit;Curtain   Bathroom Toilet: Handicapped height Bathroom Accessibility: Yes   Home Equipment: Environmental consultant - 4 wheels;Adaptive equipment;Tub bench Adaptive Equipment: Long-handled sponge Additional Comments: has children but all gone during the day, has BSC and bench for tub      Prior Functioning/Environment Level of Independence: Independent with assistive device(s)        Comments: walks with rollator, takes care of the house and enjoys driving kids to activities         OT Problem List: Decreased strength;Decreased activity tolerance;Impaired balance (sitting and/or standing);Decreased knowledge of use of DME or AE;Decreased knowledge of precautions;Pain;Increased edema      OT Treatment/Interventions: Self-care/ADL training;Therapeutic exercise;Energy conservation;DME and/or AE instruction;Therapeutic activities;Patient/family education    OT Goals(Current goals can be found in the care plan section) Acute Rehab OT Goals Patient Stated  Goal: "Get this pain managed and then go home" OT Goal Formulation: With patient Time For Goal Achievement: 03/25/18 Potential to Achieve Goals: Good  OT Frequency: Min 2X/week   Barriers to D/C: Decreased caregiver support  Home alone during the day       Co-evaluation              AM-PAC PT "6 Clicks" Daily Activity     Outcome Measure Help from another person eating meals?: None Help from another person taking care of personal grooming?: A Little Help from another person toileting, which includes using toliet, bedpan, or urinal?: A Little Help from another person bathing (including washing, rinsing, drying)?: A Little Help from another person to put on and taking off regular upper body clothing?: None Help from another person to put on and taking off regular lower body clothing?: A Little 6 Click Score: 20   End of Session Equipment Utilized During Treatment: Gait belt;Rolling walker Nurse Communication: Mobility status  Activity Tolerance: Patient tolerated treatment well Patient left: in chair;with call bell/phone within reach  OT Visit Diagnosis: Unsteadiness on feet (R26.81);Other abnormalities of gait and mobility (R26.89);Muscle weakness (generalized) (M62.81);Pain Pain - Right/Left: (Bilateral) Pain - part of body: Ankle and joints of foot;Leg;Knee                Time: 2122-4825 OT Time Calculation (min): 20 min Charges:  OT General Charges $OT Visit: 1 Visit OT Evaluation $OT Eval Low Complexity: [place], OTR/L Acute Rehab Pager: [phone] Office: [place]  Chantee Cerino 03/25/2018, 3:01 PM

## 2018-03-25 NOTE — Progress Notes (Signed)
Pt is itching all over and scratched an open spot on his bottom. RN paged hospitalist for itching medication. NT placed foam over open site on left buttocks.

## 2018-03-26 ENCOUNTER — Observation Stay (HOSPITAL_COMMUNITY): Payer: BC Managed Care – PPO

## 2018-03-26 ENCOUNTER — Encounter (HOSPITAL_COMMUNITY): Payer: Self-pay | Admitting: *Deleted

## 2018-03-26 DIAGNOSIS — N183 Chronic kidney disease, stage 3 (moderate): Secondary | ICD-10-CM | POA: Diagnosis not present

## 2018-03-26 DIAGNOSIS — M79673 Pain in unspecified foot: Secondary | ICD-10-CM

## 2018-03-26 DIAGNOSIS — M79671 Pain in right foot: Secondary | ICD-10-CM | POA: Diagnosis not present

## 2018-03-26 DIAGNOSIS — I5042 Chronic combined systolic (congestive) and diastolic (congestive) heart failure: Secondary | ICD-10-CM | POA: Diagnosis not present

## 2018-03-26 DIAGNOSIS — M79604 Pain in right leg: Secondary | ICD-10-CM | POA: Diagnosis not present

## 2018-03-26 LAB — BASIC METABOLIC PANEL
Anion gap: 7 (ref 5–15)
BUN: 29 mg/dL — ABNORMAL HIGH (ref 6–20)
CALCIUM: 8.4 mg/dL — AB (ref 8.9–10.3)
CHLORIDE: 99 mmol/L (ref 98–111)
CO2: 32 mmol/L (ref 22–32)
Creatinine, Ser: 1.83 mg/dL — ABNORMAL HIGH (ref 0.61–1.24)
GFR calc Af Amer: 47 mL/min — ABNORMAL LOW (ref >60)
GFR calc non Af Amer: 40 mL/min — ABNORMAL LOW (ref >60)
GLUCOSE: 98 mg/dL (ref 70–99)
POTASSIUM: 3.5 mmol/L (ref 3.5–5.1)
Sodium: 138 mmol/L (ref 135–145)

## 2018-03-26 LAB — GLUCOSE, CAPILLARY
GLUCOSE-CAPILLARY: 157 mg/dL — AB (ref 70–99)
Glucose-Capillary: 97 mg/dL (ref 70–99)

## 2018-03-26 LAB — CBC
HCT: 36.6 % — ABNORMAL LOW (ref 39.0–52.0)
HEMOGLOBIN: 11.3 g/dL — AB (ref 13.0–17.0)
MCH: 29.8 pg (ref 26.0–34.0)
MCHC: 30.9 g/dL (ref 30.0–36.0)
MCV: 96.6 fL (ref 80.0–100.0)
NRBC: 0 % (ref 0.0–0.2)
PLATELETS: 230 10*3/uL (ref 150–400)
RBC: 3.79 MIL/uL — AB (ref 4.22–5.81)
RDW: 14.9 % (ref 11.5–15.5)
WBC: 5 10*3/uL (ref 4.0–10.5)

## 2018-03-26 LAB — URIC ACID: URIC ACID, SERUM: 8.3 mg/dL (ref 3.7–8.6)

## 2018-03-26 MED ORDER — PREDNISONE 20 MG PO TABS
60.0000 mg | ORAL_TABLET | Freq: Every day | ORAL | Status: DC
  Administered 2018-03-26 – 2018-03-28 (×3): 60 mg via ORAL
  Filled 2018-03-26 (×3): qty 3

## 2018-03-26 NOTE — Clinical Social Work Note (Signed)
Clinical Social Work Assessment  Patient Details  Name: Jay Chen MRN: [id number] Date of Birth: [birth year]  Date of referral:  03/26/18               Reason for consult:  Facility Placement                Permission sought to share information with:  Family Supports Permission granted to share information::  Yes, Verbal Permission Granted  Name::     Medical laboratory scientific officer::     Relationship::  Spouse  Contact Information:     Housing/Transportation Living arrangements for the past 2 months:  Single Family Home Source of Information:  Patient Patient Interpreter Needed:  None Criminal Activity/Legal Involvement Pertinent to Current Situation/Hospitalization:  No - Comment as needed Significant Relationships:  Spouse Lives with:  Spouse Do you feel safe going back to the place where you live?  Yes Need for family participation in patient care:  No (Coment)  Care giving concerns:  Pt is alert and oriented. Pt's spouse present at bedside.   Social Worker assessment / plan:  CSW spoke with pt and pt's spouse at bedside. Pt has not been to SNF before. Pt lives at home with spouse and his mother. Pt's spouse works during the day. Pt is active with [facility] prior to admission. Pt and pt's spouse inquiring about pt returning home to continue therapies in home. CSW explained the difference in how frequent pt would get PT at home vs in SNF. Pt and pt's spouse understanding. Pt is not refusing SNF however, would like for PT to come and see him again prior to d/c to see if his pain is under control and if pt could return home. CSW provided a SNF list to pt's spouse at bedside for her to look over--CSW number provided for follow up.  Employment status:    Insurance informationEducational psychologist PT Recommendations:  [place] / Referral to community resources:  [place]  Patient/Family's Response to care: Pt verbalized understanding of CSW role  and expressed appreciation for support. Pt denies any concern regarding pt care at this time.    Patient/Family's Understanding of and Emotional Response to Diagnosis, Current Treatment, and Prognosis:  Pt understanding and realistic regarding physical limitations. Pt understands the need for SNF placement at d/c. Pt agreeable to SNF placement at d/c, at this time. Pt's responses emotionally appropriate during conversation with CSW. Pt denies any concern regarding treatment plan at this time. CSW will continue to provide support and facilitate d/c needs.   Emotional Assessment Appearance:  Appears stated age Attitude/Demeanor/Rapport:  (Patient was approporiate) Affect (typically observed):  Accepting, Appropriate, Calm Orientation:  Oriented to Self, Oriented to Place, Oriented to  Time, Oriented to Situation Alcohol / Substance use:  Not Applicable Psych involvement (Current and /or in the community):  No (Comment)  Discharge Needs  Concerns to be addressed:  Basic Needs, Care Coordination Readmission within the last 30 days:  Yes Current discharge risk:  Dependent with Mobility Barriers to Discharge:  Continued Medical Work up   [employer], LCSW 03/26/2018, 9:31 AM

## 2018-03-26 NOTE — Progress Notes (Signed)
PROGRESS NOTE  Jay Chen QQI:[id number] DOB: 1965-12-28 DOA: 03/24/2018 PCP: [name], MD  Brief History:  52 year old male with a history of systolic and diastolic CHF, CKD stage III, hypertension, atrial fibrillation, hyperlipidemia, diet-controlled diabetes mellitus, abdominal aortic aneurysm presenting with bilateral foot pain that began on 03/23/2018.  Patient denies any recent injury or trauma.  He denies any new medications.  Endorses compliance with all his medications.  Patient stated that his pain had worsened to the point where he had difficulty ambulating.  At baseline, the patient uses a rolling walker.  Upon presentation, the patient was afebrile hemodynamically stable with WBC 8.8.  PT evaluation recommended skilled nursing facility.  Social work was consulted to assist with transition to skilled nursing facility.  Assessment/Plan: Bilateral foot pain -Clinical history not consistent with ischemic pain -Concerned about gouty arthritis flare versus peripheral neuropathy -Plain x-rays right and left foot -Check uric acid -Arterial duplex was ordered and is pending, but focal suspicion is low for ischemia -PT evaluation--> skilled nursing facility -Patient states that he has similar pain approximately 2 to 3 months prior to this admission, he was told he may have a gout at that time.  Chronic systolic and diastolic CHF -9/41/7408 echo EF 20-25%, diffuse HK -Clinically euvolemic -Continue home dose torsemide -Daily weights -03/13/2018 office weight 154.1 kg / 239 pounds  CKD stage III -Baseline creatinine 1.7-2.0 -Monitor with torsemide  Paroxysmal atrial fibrillation -Status post DCCV 02/22/2018 and 03/01/2018 -Presently in sinus rhythm -Continue amiodarone -Continue metoprolol succinate -Continue rivaroxaban  Essential hypertension -Continue hydralazine, Imdur, metoprolol succinate  Diet-controlled diabetes mellitus -01/24/2018 hemoglobin A  1C7.1 -CBGs are well controlled during this hospitalization  AAA -4.7 cm by echo8/2019 - Will need further imaging as creatinine permits.   Morbid obesity Body mass index is 50.82 kg/m. - Needs significant weight loss.    Disposition Plan:   SNF 03/27/18 if stable Family Communication:   Spouse updated at bedside--11/3  Consultants:  none  Code Status:  FULL   DVT Prophylaxis:  Xarelto   Procedures: As Listed in Progress Note Above  Antibiotics: None    Subjective: Patient denies fevers, chills, headache, chest pain, dyspnea, nausea, vomiting, diarrhea, abdominal pain, dysuria, hematuria, hematochezia, and melena.  Patient complains of bilateral feet and ankle pain, left greater than right.  He states that he was able to participate in physical therapy, but caused significant pain with weightbearing.   Objective: Vitals:   03/25/18 1523 03/25/18 1933 03/25/18 2143 03/26/18 0408  BP: 140/84 108/66 127/70 (!) 143/80  Pulse: 74 85  67  Resp: 17     Temp: 98.9 F (37.2 C) 98.9 F (37.2 C)  98.4 F (36.9 C)  TempSrc: Oral Oral  Oral  SpO2: 90% 95%  91%  Weight:      Height:        Intake/Output Summary (Last 24 hours) at 03/26/2018 0940 Last data filed at 03/26/2018 0830 Gross per 24 hour  Intake 480 ml  Output 2000 ml  Net -1520 ml   Weight change:  Exam:   General:  Pt is alert, follows commands appropriately, not in acute distress  HEENT: No icterus, No thrush, No neck mass, [place]/AT  Cardiovascular: RRR, S1/S2, no rubs, no gallops  Respiratory: CTA bilaterally, no wheezing, no crackles, no rhonchi  Abdomen: Soft/+BS, non tender, non distended, no guarding  Extremities: Trace lower ext edema, No lymphangitis, No petechiae, No rashes, no  synovitis   Data Reviewed: I have personally reviewed following labs and imaging studies Basic Metabolic Panel: Recent Labs  Lab 03/20/18 1141 03/24/18 1812 03/26/18 0546  NA 144 139 138  K 3.9 3.7 3.5   CL 103 98 99  CO2 32 35* 32  GLUCOSE 91 112* 98  BUN 26* 26* 29*  CREATININE 1.78* 1.99* 1.83*  CALCIUM 8.9 8.7* 8.4*   Liver Function Tests: Recent Labs  Lab 03/24/18 1812  AST 19  ALT 24  ALKPHOS 82  BILITOT 1.6*  PROT 6.7  ALBUMIN 2.8*   No results for input(s): LIPASE, AMYLASE in the last 168 hours. No results for input(s): AMMONIA in the last 168 hours. Coagulation Profile: No results for input(s): INR, PROTIME in the last 168 hours. CBC: Recent Labs  Lab 03/24/18 1812  WBC 8.8  NEUTROABS 5.9  HGB 12.5*  HCT 38.2*  MCV 97.7  PLT 234   Cardiac Enzymes: No results for input(s): CKTOTAL, CKMB, CKMBINDEX, TROPONINI in the last 168 hours. BNP: Invalid input(s): POCBNP CBG: Recent Labs  Lab 03/25/18 0222 03/25/18 0646 03/25/18 2121 03/26/18 0627  GLUCAP 81 94 99 97   HbA1C: No results for input(s): HGBA1C in the last 72 hours. Urine analysis:    Component Value Date/Time   COLORURINE YELLOW 03/24/2018 1926   APPEARANCEUR CLEAR 03/24/2018 1926   LABSPEC 1.009 03/24/2018 1926   PHURINE 7.0 03/24/2018 1926   GLUCOSEU NEGATIVE 03/24/2018 1926   GLUCOSEU NEGATIVE 09/01/2017 1357   HGBUR NEGATIVE 03/24/2018 1926   BILIRUBINUR NEGATIVE 03/24/2018 1926   KETONESUR NEGATIVE 03/24/2018 1926   PROTEINUR NEGATIVE 03/24/2018 1926   UROBILINOGEN 1.0 09/01/2017 1357   NITRITE NEGATIVE 03/24/2018 1926   LEUKOCYTESUR NEGATIVE 03/24/2018 1926   Sepsis Labs: @LABRCNTIP (procalcitonin:4,lacticidven:4) )No results found for this or any previous visit (from the past 240 hour(s)).   Scheduled Meds: . allopurinol  100 mg Oral Daily  . amiodarone  200 mg Oral Daily  . aspirin  81 mg Oral Daily  . atorvastatin  80 mg Oral Daily  . hydrALAZINE  100 mg Oral TID  . isosorbide mononitrate  30 mg Oral Daily  . metoprolol succinate  75 mg Oral Daily  . omega-3 acid ethyl esters  2 g Oral BID  . potassium chloride SA  60 mEq Oral Daily  . rivaroxaban  20 mg Oral Q supper   . torsemide  40 mg Oral Daily   Continuous Infusions:  Procedures/Studies: Dg Chest Port 1 View  Result Date: 03/24/2018 CLINICAL DATA:  Volume overload.  Bilateral foot swelling EXAM: PORTABLE CHEST 1 VIEW COMPARISON:  02/16/2018 FINDINGS: Cardiac enlargement. Mild vascular congestion without edema. Left lower lobe density compatible with atelectasis and possibly effusion. IMPRESSION: Mild vascular congestion without edema Left lower lobe consolidation most likely atelectasis. Electronically Signed   By: Franchot Gallo M.D.   On: 03/24/2018 19:20    Orson Eva, DO  [facility] Pager [phone]  If 7PM-7AM, please contact night-coverage [email or web address] Password [email or web address] 03/26/2018, 9:40 AM   LOS: 0 days

## 2018-03-26 NOTE — NC FL2 (Signed)
[place] MEDICAID FL2 LEVEL OF CARE SCREENING TOOL     IDENTIFICATION  Patient Name: Jay Chen Birthdate: 05/29/1965 Sex: male Admission Date (Current Location): 03/24/2018  [facility] [place] Number:  Herbalist and Address:  [facility], Olivia Lopez de Gutierrez [address], [place], [place] [address]      Provider Number: [id number]  Attending Physician Name and Address:  Orson Eva, MD  Relative Name and Phone Number:       Current Level of Care: Hospital Recommended Level of Care: [place] Prior Approval Number:    Date Approved/Denied:   PASRR Number: [id number]  Discharge Plan: SNF    Current Diagnoses: Patient Active Problem List   Diagnosis Date Noted  . Bilateral foot pain 03/25/2018  . Snoring 03/13/2018  . Acute blood loss anemia   . Hypoalbuminemia due to protein-calorie malnutrition ([place])   . CKD (chronic kidney disease), stage III ([place])   . Chronic combined systolic and diastolic congestive heart failure ([place])   . PAF (paroxysmal atrial fibrillation) ([place])   . Debility 02/23/2018  . CHF (congestive heart failure), NYHA class III ([place]) 02/15/2018  . CKD (chronic kidney disease) stage 4, GFR 15-29 ml/min (HCC)   . Left temporal lobe infarction ([place])   . Morbid obesity ([place])   . Diabetes mellitus with neuropathy ([place])   . Atrial fibrillation, chronic   . Cerebral embolism with cerebral infarction 01/01/2018  . Lumbar radiculopathy   . Cervical spinal cord compression ([place])   . DCM (dilated cardiomyopathy) ([place])   . Atrial fibrillation with rapid ventricular response ([place])   . Chronic combined systolic (congestive) and diastolic (congestive) heart failure ([place])   . Acute on chronic systolic heart failure ([place]) 11/22/2017  . Bilateral edema of lower extremity 10/21/2017  . Pure hyperglyceridemia 09/01/2017  . Phimosis/redundant prepuce 06/03/2015  . Gout of big toe 09/11/2012  . Hyperlipidemia with target LDL less  than 100 09/11/2012  . Routine general medical examination at a health care facility 01/19/2012  . Cor pulmonale ([place]) 01/08/2011  . HTN (hypertension) 01/08/2011  . Obesities, morbid ([place]) 01/08/2011  . Aneurysm of thoracic aorta ([place]) 01/08/2011  . Obstructive sleep apnea 08/27/2007  . RHINITIS 08/18/2007    Orientation RESPIRATION BLADDER Height & Weight     Self, Time, Situation, Place  Normal Continent Weight: (!) 334 lb 3.5 oz (151.6 kg) Height:  5\' 8"  (172.7 cm)  BEHAVIORAL SYMPTOMS/MOOD NEUROLOGICAL BOWEL NUTRITION STATUS      Continent Diet(heart healthy/carb modified, thin liquids)  AMBULATORY STATUS COMMUNICATION OF NEEDS Skin   Limited Assist Verbally Normal                       Personal Care Assistance Level of Assistance  Bathing, Feeding, Dressing Bathing Assistance: Limited assistance Feeding assistance: Independent Dressing Assistance: Limited assistance     Functional Limitations Info  Sight, Hearing, Speech Sight Info: Adequate Hearing Info: Adequate Speech Info: Adequate    SPECIAL CARE FACTORS FREQUENCY  PT (By licensed PT), OT (By licensed OT)     PT Frequency: 2x OT Frequency: 2x            Contractures Contractures Info: Not present    Additional Factors Info  Code Status, Allergies Code Status Info: Full Code Allergies Info: No known allergies           Current Medications (03/26/2018):  This is the current hospital active medication list Current Facility-Administered Medications  Medication Dose Route Frequency Provider Last Rate Last Dose  . acetaminophen (TYLENOL) tablet 650 mg  650 mg Oral Q6H PRN Etta Quill, DO   650 mg at 03/25/18 2136   Or  . acetaminophen (TYLENOL) suppository 650 mg  650 mg Rectal Q6H PRN Etta Quill, DO      . acetaminophen (TYLENOL) tablet 325-650 mg  325-650 mg Oral Q6H PRN Etta Quill, DO      . allopurinol (ZYLOPRIM) tablet 100 mg  100 mg Oral Daily Jennette Kettle M, DO   100 mg  at 03/26/18 0805  . amiodarone (PACERONE) tablet 200 mg  200 mg Oral Daily Jennette Kettle M, DO   200 mg at 03/26/18 0805  . aspirin chewable tablet 81 mg  81 mg Oral Daily Etta Quill, DO   81 mg at 03/26/18 6825  . atorvastatin (LIPITOR) tablet 80 mg  80 mg Oral Daily Etta Quill, DO   80 mg at 03/26/18 0935  . hydrALAZINE (APRESOLINE) tablet 100 mg  100 mg Oral TID Etta Quill, DO   100 mg at 03/26/18 0805  . hydrOXYzine (ATARAX/VISTARIL) tablet 25 mg  25 mg Oral TID PRN Donne Hazel, MD   25 mg at 03/25/18 1256  . isosorbide mononitrate (IMDUR) 24 hr tablet 30 mg  30 mg Oral Daily Jennette Kettle M, DO   30 mg at 03/26/18 0806  . metoprolol succinate (TOPROL-XL) 24 hr tablet 75 mg  75 mg Oral Daily Etta Quill, DO   75 mg at 03/26/18 0806  . omega-3 acid ethyl esters (LOVAZA) capsule 2 g  2 g Oral BID Etta Quill, DO   2 g at 03/26/18 0810  . ondansetron (ZOFRAN) tablet 4 mg  4 mg Oral Q6H PRN Etta Quill, DO       Or  . ondansetron [facility]) injection 4 mg  4 mg Intravenous Q6H PRN Etta Quill, DO      . potassium chloride SA (K-DUR,KLOR-CON) CR tablet 60 mEq  60 mEq Oral Daily Etta Quill, DO   60 mEq at 03/26/18 0806  . rivaroxaban (XARELTO) tablet 20 mg  20 mg Oral Q supper Etta Quill, DO   20 mg at 03/25/18 1652  . torsemide (DEMADEX) tablet 40 mg  40 mg Oral Daily Jennette Kettle M, DO   40 mg at 03/26/18 0805     Discharge Medications: Please see discharge summary for a list of discharge medications.  Relevant Imaging Results:  Relevant Lab Results:   Additional Information SS#: [id number]  Eileen Stanford, LCSW

## 2018-03-27 ENCOUNTER — Other Ambulatory Visit: Payer: Self-pay

## 2018-03-27 ENCOUNTER — Ambulatory Visit: Payer: BC Managed Care – PPO | Admitting: Internal Medicine

## 2018-03-27 ENCOUNTER — Ambulatory Visit (HOSPITAL_BASED_OUTPATIENT_CLINIC_OR_DEPARTMENT_OTHER): Payer: BC Managed Care – PPO

## 2018-03-27 DIAGNOSIS — M79604 Pain in right leg: Secondary | ICD-10-CM

## 2018-03-27 DIAGNOSIS — M79605 Pain in left leg: Secondary | ICD-10-CM

## 2018-03-27 DIAGNOSIS — R52 Pain, unspecified: Secondary | ICD-10-CM

## 2018-03-27 DIAGNOSIS — I5042 Chronic combined systolic (congestive) and diastolic (congestive) heart failure: Secondary | ICD-10-CM

## 2018-03-27 DIAGNOSIS — I482 Chronic atrial fibrillation, unspecified: Secondary | ICD-10-CM

## 2018-03-27 DIAGNOSIS — I10 Essential (primary) hypertension: Secondary | ICD-10-CM

## 2018-03-27 DIAGNOSIS — N183 Chronic kidney disease, stage 3 (moderate): Secondary | ICD-10-CM | POA: Diagnosis not present

## 2018-03-27 DIAGNOSIS — Z0289 Encounter for other administrative examinations: Secondary | ICD-10-CM

## 2018-03-27 LAB — BASIC METABOLIC PANEL
Anion gap: 4 — ABNORMAL LOW (ref 5–15)
BUN: 32 mg/dL — AB (ref 6–20)
CHLORIDE: 101 mmol/L (ref 98–111)
CO2: 34 mmol/L — AB (ref 22–32)
CREATININE: 1.83 mg/dL — AB (ref 0.61–1.24)
Calcium: 8.5 mg/dL — ABNORMAL LOW (ref 8.9–10.3)
GFR calc Af Amer: 47 mL/min — ABNORMAL LOW (ref >60)
GFR calc non Af Amer: 40 mL/min — ABNORMAL LOW (ref >60)
Glucose, Bld: 165 mg/dL — ABNORMAL HIGH (ref 70–99)
POTASSIUM: 3.7 mmol/L (ref 3.5–5.1)
Sodium: 139 mmol/L (ref 135–145)

## 2018-03-27 NOTE — Discharge Instructions (Signed)

## 2018-03-27 NOTE — Progress Notes (Signed)
PROGRESS NOTE  Jay Chen JSH:[id number] DOB: 1965-11-12 DOA: 03/24/2018 PCP: [name], MD  Brief History:  52 year old male with a history of systolic and diastolic CHF, CKD stage III, hypertension, atrial fibrillation, hyperlipidemia, diet-controlled diabetes mellitus, abdominal aortic aneurysm presenting with bilateral foot pain that began on 03/23/2018.  Patient denies any recent injury or trauma.  He denies any new medications.  Endorses compliance with all his medications.  Patient stated that his pain had worsened to the point where he had difficulty ambulating.  At baseline, the patient uses a rolling walker.  Upon presentation, the patient was afebrile hemodynamically stable with WBC 8.8.  PT evaluation recommended skilled nursing facility.  Social work was consulted to assist with transition to skilled nursing facility.  Assessment/Plan: Bilateral foot pain -Concerned about gouty arthritis flare versus peripheral neuropathy -Plain x-rays right and left foot, L foot showed first MTP likely 2/2 to gout -Uric acid, 8.3 -Arterial duplex with ABI WNL -PT evaluation--> skilled nursing facility -Continue allopurinol, prednisone  Chronic systolic and diastolic CHF -8/50/2774 echo EF 20-25%, diffuse HK -Clinically euvolemic -Continue home dose torsemide -Daily weights -03/13/2018 office weight 154.1 kg / 239 pounds  CKD stage III -Baseline creatinine 1.7-2.0 -Monitor with torsemide  Paroxysmal atrial fibrillation -Status post DCCV 02/22/2018 and 03/01/2018 -Presently in sinus rhythm -Continue amiodarone -Continue metoprolol succinate -Continue rivaroxaban  Essential hypertension -Continue hydralazine, Imdur, metoprolol succinate  Diet-controlled diabetes mellitus -01/24/2018 hemoglobin A1C-->7.1 -CBGs are well controlled during this hospitalization  AAA -4.7 cm by echo8/2019 - Follow as an outpt  Morbid obesity Body mass index is 50.82  kg/m. Advised significant weight loss.    Disposition Plan: SNF 03/28/18, once bed available  Family Communication: None at bedside  Consultants:  None  Code Status:  FULL   DVT Prophylaxis:  Xarelto   Procedures: None  Antibiotics: None    Subjective: Reports improvement in bilateral foot pain, denies any other new complaints   Objective: Vitals:   03/26/18 1459 03/26/18 2009 03/27/18 0309 03/27/18 1325  BP: 119/77 136/87 138/81 118/78  Pulse: 73 83 64 74  Resp: 16   18  Temp: 98 F (36.7 C) 98.2 F (36.8 C) 98.2 F (36.8 C) 98.2 F (36.8 C)  TempSrc: Oral Oral Oral Oral  SpO2: 92% 90% 91% 91%  Weight:      Height:        Intake/Output Summary (Last 24 hours) at 03/27/2018 1847 Last data filed at 03/27/2018 1714 Gross per 24 hour  Intake 480 ml  Output 1100 ml  Net -620 ml   Weight change:  Exam:  General: NAD, obese   Cardiovascular: S1, S2 present  Respiratory: CTAB  Abdomen: Soft, nontender, nondistended, bowel sounds present  Musculoskeletal: No bilateral pedal edema noted  Skin: Normal  Psychiatry: No new focal neurologic deficits noted    Data Reviewed: I have personally reviewed following labs and imaging studies Basic Metabolic Panel: Recent Labs  Lab 03/24/18 1812 03/26/18 0546 03/27/18 0341  NA 139 138 139  K 3.7 3.5 3.7  CL 98 99 101  CO2 35* 32 34*  GLUCOSE 112* 98 165*  BUN 26* 29* 32*  CREATININE 1.99* 1.83* 1.83*  CALCIUM 8.7* 8.4* 8.5*   Liver Function Tests: Recent Labs  Lab 03/24/18 1812  AST 19  ALT 24  ALKPHOS 82  BILITOT 1.6*  PROT 6.7  ALBUMIN 2.8*   No results for input(s): LIPASE, AMYLASE in the  last 168 hours. No results for input(s): AMMONIA in the last 168 hours. Coagulation Profile: No results for input(s): INR, PROTIME in the last 168 hours. CBC: Recent Labs  Lab 03/24/18 1812 03/26/18 1009  WBC 8.8 5.0  NEUTROABS 5.9  --   HGB 12.5* 11.3*  HCT 38.2* 36.6*  MCV 97.7 96.6  PLT  234 230   Cardiac Enzymes: No results for input(s): CKTOTAL, CKMB, CKMBINDEX, TROPONINI in the last 168 hours. BNP: Invalid input(s): POCBNP CBG: Recent Labs  Lab 03/25/18 0222 03/25/18 0646 03/25/18 2121 03/26/18 0627 03/26/18 2130  GLUCAP 81 94 99 97 157*   HbA1C: No results for input(s): HGBA1C in the last 72 hours. Urine analysis:    Component Value Date/Time   COLORURINE YELLOW 03/24/2018 1926   APPEARANCEUR CLEAR 03/24/2018 1926   LABSPEC 1.009 03/24/2018 1926   PHURINE 7.0 03/24/2018 1926   GLUCOSEU NEGATIVE 03/24/2018 1926   GLUCOSEU NEGATIVE 09/01/2017 1357   HGBUR NEGATIVE 03/24/2018 1926   BILIRUBINUR NEGATIVE 03/24/2018 1926   KETONESUR NEGATIVE 03/24/2018 1926   PROTEINUR NEGATIVE 03/24/2018 1926   UROBILINOGEN 1.0 09/01/2017 1357   NITRITE NEGATIVE 03/24/2018 1926   LEUKOCYTESUR NEGATIVE 03/24/2018 1926   Sepsis Labs: @LABRCNTIP (procalcitonin:4,lacticidven:4) )No results found for this or any previous visit (from the past 240 hour(s)).   Scheduled Meds: . allopurinol  100 mg Oral Daily  . amiodarone  200 mg Oral Daily  . aspirin  81 mg Oral Daily  . atorvastatin  80 mg Oral Daily  . hydrALAZINE  100 mg Oral TID  . isosorbide mononitrate  30 mg Oral Daily  . metoprolol succinate  75 mg Oral Daily  . omega-3 acid ethyl esters  2 g Oral BID  . potassium chloride SA  60 mEq Oral Daily  . predniSONE  60 mg Oral Q breakfast  . rivaroxaban  20 mg Oral Q supper  . torsemide  40 mg Oral Daily   Continuous Infusions:  Procedures/Studies: Dg Ankle 2 Views Left  Result Date: 03/26/2018 CLINICAL DATA:  Medial ankle and foot pain.  No injury. EXAM: LEFT ANKLE - 2 VIEW; LEFT FOOT - 2 VIEW COMPARISON:  None. FINDINGS: No acute fracture or dislocation. The ankle mortise is symmetric. The talar dome is intact. No tibiotalar joint effusion. Degenerative dorsal spurring of the talonavicular joint. Small erosions involving the first MTP joint. Joint spaces are  preserved. Bone mineralization is normal. Small Achilles enthesophyte. Diffuse soft tissue swelling of the ankle and foot. IMPRESSION: 1.  No acute osseous abnormality. 2. Small erosions involving the first MTP joint are likely related to gout. Electronically Signed   By: Titus Dubin M.D.   On: 03/26/2018 11:05   Dg Ankle 2 Views Right  Result Date: 03/26/2018 CLINICAL DATA:  No known injury, ankle pain EXAM: RIGHT ANKLE - 2 VIEW COMPARISON:  None. FINDINGS: No fracture or dislocation. Ankle mortise is intact. Enthesopathic changes of the Achilles tendon insertion. Tiny plantar calcaneal spur. Soft tissue edema circumferentially around the ankle and right lower leg. IMPRESSION: No acute osseous injury of the right ankle. Electronically Signed   By: Kathreen Devoid   On: 03/26/2018 11:07   Dg Chest Port 1 View  Result Date: 03/24/2018 CLINICAL DATA:  Volume overload.  Bilateral foot swelling EXAM: PORTABLE CHEST 1 VIEW COMPARISON:  02/16/2018 FINDINGS: Cardiac enlargement. Mild vascular congestion without edema. Left lower lobe density compatible with atelectasis and possibly effusion. IMPRESSION: Mild vascular congestion without edema Left lower lobe consolidation most likely atelectasis.  Electronically Signed   By: Franchot Gallo M.D.   On: 03/24/2018 19:20   Dg Foot 2 Views Left  Result Date: 03/26/2018 CLINICAL DATA:  Medial ankle and foot pain.  No injury. EXAM: LEFT ANKLE - 2 VIEW; LEFT FOOT - 2 VIEW COMPARISON:  None. FINDINGS: No acute fracture or dislocation. The ankle mortise is symmetric. The talar dome is intact. No tibiotalar joint effusion. Degenerative dorsal spurring of the talonavicular joint. Small erosions involving the first MTP joint. Joint spaces are preserved. Bone mineralization is normal. Small Achilles enthesophyte. Diffuse soft tissue swelling of the ankle and foot. IMPRESSION: 1.  No acute osseous abnormality. 2. Small erosions involving the first MTP joint are likely related  to gout. Electronically Signed   By: Titus Dubin M.D.   On: 03/26/2018 11:05   Dg Foot 2 Views Right  Result Date: 03/26/2018 CLINICAL DATA:  Right foot pain EXAM: RIGHT FOOT - 2 VIEW COMPARISON:  None. FINDINGS: No acute fracture or dislocation. Minimal osteoarthritis of the first MTP joint. No aggressive osseous lesion. No periosteal reaction or bone destruction. Severe soft tissue swelling along the dorsal aspect of the foot. Soft tissue edema in the ankle. IMPRESSION: 1.  No acute osseous injury of the right. Electronically Signed   By: Kathreen Devoid   On: 03/26/2018 11:01    Alma Friendly, MD  [facility]  If 7PM-7AM, please contact night-coverage [email or web address] 03/27/2018, 6:47 PM   LOS: 0 days

## 2018-03-27 NOTE — Progress Notes (Signed)
VASCULAR LAB PRELIMINARY  ARTERIAL  ABI completed:    RIGHT    LEFT    PRESSURE WAVEFORM  PRESSURE WAVEFORM  BRACHIAL 157 triphasic BRACHIAL 254 triphasic  DP 195 triphasic DP 160 triphasic         PT 194 triphasic PT 170 triphasic           RIGHT LEFT  ABI 1.24 1.08   Right and Left ABI's are within normal range.    Garnet Sierras 03/27/2018, 11:29 AM

## 2018-03-27 NOTE — Plan of Care (Signed)
  Problem: Education: Goal: Knowledge of General Education information will improve Description Including pain rating scale, medication(s)/side effects and non-pharmacologic comfort measures Outcome: Progressing   Problem: Clinical Measurements: Goal: Ability to maintain clinical measurements within normal limits will improve Outcome: Progressing Goal: Will remain free from infection Outcome: Progressing   Problem: Safety: Goal: Ability to remain free from injury will improve Outcome: Progressing   Problem: Cardiac: Goal: Ability to achieve and maintain adequate cardiopulmonary perfusion will improve Outcome: Progressing

## 2018-03-28 DIAGNOSIS — I482 Chronic atrial fibrillation, unspecified: Secondary | ICD-10-CM | POA: Diagnosis not present

## 2018-03-28 DIAGNOSIS — N183 Chronic kidney disease, stage 3 (moderate): Secondary | ICD-10-CM | POA: Diagnosis not present

## 2018-03-28 DIAGNOSIS — I5042 Chronic combined systolic (congestive) and diastolic (congestive) heart failure: Secondary | ICD-10-CM | POA: Diagnosis not present

## 2018-03-28 DIAGNOSIS — M109 Gout, unspecified: Secondary | ICD-10-CM

## 2018-03-28 DIAGNOSIS — M79604 Pain in right leg: Secondary | ICD-10-CM | POA: Diagnosis not present

## 2018-03-28 MED ORDER — PREDNISONE 20 MG PO TABS: 40.0000 mg | ORAL_TABLET | Freq: Every day | ORAL | 0 refills | Status: AC

## 2018-03-28 NOTE — Discharge Summary (Signed)
Discharge Summary  Jay Chen:[id number] DOB: 1966-03-04  PCP: [name], MD  Admit date: 03/24/2018 Discharge date: 03/28/2018  Time spent: 30 mins  Recommendations for Outpatient Follow-up:  1. PCP in 1 week  Discharge Diagnoses:  Active Hospital Problems   Diagnosis Date Noted  . Pain in both lower extremities 03/25/2018  . Foot pain   . CKD (chronic kidney disease), stage III ([place])   . Atrial fibrillation, chronic   . Chronic combined systolic (congestive) and diastolic (congestive) heart failure ([place])   . HTN (hypertension) 01/08/2011    Resolved Hospital Problems  No resolved problems to display.    Discharge Condition: Stable  Diet recommendation: Heart healthy, mod carb   Vitals:   03/27/18 2000 03/28/18 0505  BP: (!) 151/89 (!) 170/92  Pulse: 74 (!) 58  Resp: 18 18  Temp: 98 F (36.7 C) 98.3 F (36.8 C)  SpO2: 91% 93%    History of present illness:  52 year old male with a history of systolic and diastolic CHF, CKD stage III, hypertension, atrial fibrillation, hyperlipidemia, diet-controlled diabetes mellitus, abdominal aortic aneurysm presenting with bilateral foot pain that began on 03/23/2018.  Patient denies any recent injury or trauma.  He denies any new medications.  Endorses compliance with all his medications.  Patient stated that his pain had worsened to the point where he had difficulty ambulating.  At baseline, the patient uses a rolling walker.  Upon presentation, the patient was afebrile hemodynamically stable with WBC 8.8.  PT evaluation recommended skilled nursing facility.  Social work was consulted to assist with transition to skilled nursing facility.   Today, pt reported feeling much better, reports improved bilateral foot pain, denies any worsening SOB, chest pain, abdominal pain, N/V, fever/chills. Pt refused SNF, wants to go home. HH arranged for patient.  Hospital Course:  Principal Problem:   Pain in both lower  extremities Active Problems:   HTN (hypertension)   Chronic combined systolic (congestive) and diastolic (congestive) heart failure (H[facility])   Atrial fibrillation, chronic   CKD (chronic kidney disease), stage III ([facility])   Foot pain  Bilateral foot pain likely 2/2 to gouty arthritis flare -Plain x-rays right and left foot, L foot showed first MTP likely 2/2 to gout -Uric acid, 8.3 -Arterial duplex with ABI WNL -PT evaluation--> skilled nursing facility, refused, wants to go home -Continue allopurinol, prednisone for total of 5 days for flare -PCP to follow up  Chronic systolic and diastolic CHF -5/63/8756 echo EF 20-25%, diffuse HK -Clinically euvolemic -Continue home dose torsemide  CKD stage III -Baseline creatinine 1.7-2.0 -Monitor with torsemide -PCP to follow up  Paroxysmal atrial fibrillation -Status post DCCV 02/22/2018 and 03/01/2018 -Presently in sinus rhythm -Continue amiodarone -Continue metoprolol succinate -Continue rivaroxaban  Essential hypertension -Continue hydralazine, Imdur, metoprolol succinate  Diet-controlled diabetes mellitus -01/24/2018 hemoglobin A1C-->7.1 -CBGs are well controlled during this hospitalization  AAA -4.7 cm by echo8/2019 - Follow as an outpt, for repeat imaging  Morbid obesity Body mass index is 50.82 kg/m. Advised significant weight loss.    Procedures:  None  Consultations:  None  Discharge Exam: BP (!) 170/92 (BP Location: Left Arm)   Pulse (!) 58   Temp 98.3 F (36.8 C) (Oral)   Resp 18   Ht 5\' 8"  (1.727 m)   Wt (!) 151.6 kg   SpO2 93%   BMI 50.82 kg/m   General: NAD Cardiovascular: S1, S2 present Respiratory: Diminished BS bilaterally  Discharge Instructions You were cared for by  a hospitalist during your hospital stay. If you have any questions about your discharge medications or the care you received while you were in the hospital after you are discharged, you can call the unit and asked to  speak with the hospitalist on call if the hospitalist that took care of you is not available. Once you are discharged, your primary care physician will handle any further medical issues. Please note that NO REFILLS for any discharge medications will be authorized once you are discharged, as it is imperative that you return to your primary care physician (or establish a relationship with a primary care physician if you do not have one) for your aftercare needs so that they can reassess your need for medications and monitor your lab values.   Allergies as of 03/28/2018   No Known Allergies     Medication List    TAKE these medications   acetaminophen 325 MG tablet Commonly known as:  TYLENOL Take 1-2 tablets (325-650 mg total) by mouth every 6 (six) hours as needed for mild pain.   allopurinol 100 MG tablet Commonly known as:  ZYLOPRIM Take 1 tablet (100 mg total) by mouth daily.   amiodarone 200 MG tablet Commonly known as:  PACERONE Take 1 tablet (200 mg total) by mouth daily.   aspirin 81 MG EC tablet Take 1 tablet (81 mg total) by mouth daily.   atorvastatin 80 MG tablet Commonly known as:  LIPITOR Take 1 tablet (80 mg total) by mouth daily.   hydrALAZINE 100 MG tablet Commonly known as:  APRESOLINE Take 1 tablet (100 mg total) by mouth 3 (three) times daily.   isosorbide mononitrate 30 MG 24 hr tablet Commonly known as:  IMDUR Take 1 tablet (30 mg total) by mouth daily.   metoprolol succinate 25 MG 24 hr tablet Commonly known as:  TOPROL-XL Take 3 tablets (75 mg total) by mouth daily.   omega-3 acid ethyl esters 1 g capsule Commonly known as:  LOVAZA Take 2 capsules (2 g total) by mouth 2 (two) times daily.   potassium chloride SA 20 MEQ tablet Commonly known as:  K-DUR,KLOR-CON Take 3 tablets (60 mEq total) by mouth daily. What changed:    how much to take  when to take this   predniSONE 20 MG tablet Commonly known as:  DELTASONE Take 2 tablets (40 mg total)  by mouth daily with breakfast for 2 days. Start taking on:  03/29/2018   rivaroxaban 20 MG Tabs tablet Commonly known as:  XARELTO Take 1 tablet (20 mg total) by mouth daily with supper.   torsemide 20 MG tablet Commonly known as:  DEMADEX Take 2 tablets (40 mg total) by mouth every morning. May also take 1 tablet (20 mg total) as needed (for weight 343 or greater).      No Known Allergies Follow-up Information    [name], MD In 3 days.   Specialty:  Internal Medicine Contact information: [address] [address] [phone]        [facility]             . Schedule an appointment as soon as possible for a visit in 1 week.   Contact information: [address] [phone] [place].   Specialty:  Emergency Medicine Why:  If symptoms worsen Contact information: [address]  [id number] [place] [phone] [place] Follow up.   Specialty:  Home Health Services Why:  Home Health Physical Therapy, Occupational Therapy, RN and Social Worker -agency will call to arrange initial visit Contact information: [address] [place] [address] [phone]            The results of significant diagnostics from this hospitalization (including imaging, microbiology, ancillary and laboratory) are listed below for reference.    Significant Diagnostic Studies: Dg Ankle 2 Views Left  Result Date: 03/26/2018 CLINICAL DATA:  Medial ankle and foot pain.  No injury. EXAM: LEFT ANKLE - 2 VIEW; LEFT FOOT - 2 VIEW COMPARISON:  None. FINDINGS: No acute fracture or dislocation. The ankle mortise is symmetric. The talar dome is intact. No tibiotalar joint effusion. Degenerative dorsal spurring of the talonavicular joint. Small erosions involving the  first MTP joint. Joint spaces are preserved. Bone mineralization is normal. Small Achilles enthesophyte. Diffuse soft tissue swelling of the ankle and foot. IMPRESSION: 1.  No acute osseous abnormality. 2. Small erosions involving the first MTP joint are likely related to gout. Electronically Signed   By: Titus Dubin M.D.   On: 03/26/2018 11:05   Dg Ankle 2 Views Right  Result Date: 03/26/2018 CLINICAL DATA:  No known injury, ankle pain EXAM: RIGHT ANKLE - 2 VIEW COMPARISON:  None. FINDINGS: No fracture or dislocation. Ankle mortise is intact. Enthesopathic changes of the Achilles tendon insertion. Tiny plantar calcaneal spur. Soft tissue edema circumferentially around the ankle and right lower leg. IMPRESSION: No acute osseous injury of the right ankle. Electronically Signed   By: Kathreen Devoid   On: 03/26/2018 11:07   Dg Chest Port 1 View  Result Date: 03/24/2018 CLINICAL DATA:  Volume overload.  Bilateral foot swelling EXAM: PORTABLE CHEST 1 VIEW COMPARISON:  02/16/2018 FINDINGS: Cardiac enlargement. Mild vascular congestion without edema. Left lower lobe density compatible with atelectasis and possibly effusion. IMPRESSION: Mild vascular congestion without edema Left lower lobe consolidation most likely atelectasis. Electronically Signed   By: Franchot Gallo M.D.   On: 03/24/2018 19:20   Dg Foot 2 Views Left  Result Date: 03/26/2018 CLINICAL DATA:  Medial ankle and foot pain.  No injury. EXAM: LEFT ANKLE - 2 VIEW; LEFT FOOT - 2 VIEW COMPARISON:  None. FINDINGS: No acute fracture or dislocation. The ankle mortise is symmetric. The talar dome is intact. No tibiotalar joint effusion. Degenerative dorsal spurring of the talonavicular joint. Small erosions involving the first MTP joint. Joint spaces are preserved. Bone mineralization is normal. Small Achilles enthesophyte. Diffuse soft tissue swelling of the ankle and foot. IMPRESSION: 1.  No acute osseous abnormality. 2. Small erosions involving the  first MTP joint are likely related to gout. Electronically Signed   By: Titus Dubin M.D.   On: 03/26/2018 11:05   Dg Foot 2 Views Right  Result Date: 03/26/2018 CLINICAL DATA:  Right foot pain EXAM: RIGHT FOOT - 2 VIEW COMPARISON:  None. FINDINGS: No acute fracture or dislocation. Minimal osteoarthritis of the first MTP joint. No aggressive osseous lesion. No periosteal reaction or bone destruction. Severe soft tissue swelling along the dorsal aspect of the foot. Soft tissue edema in the ankle. IMPRESSION: 1.  No acute osseous injury of the right. Electronically Signed   By: Kathreen Devoid   On: 03/26/2018 11:01   Vas [place] Abi With/wo Tbi  Result Date: 03/27/2018 LOWER EXTREMITY DOPPLER STUDY Indications: Pain.  Performing  Technologist: Abram Sander  Examination Guidelines: A complete evaluation includes at minimum, Doppler waveform signals and systolic blood pressure reading at the level of bilateral brachial, anterior tibial, and posterior tibial arteries, when vessel segments are accessible. Bilateral testing is considered an integral part of a complete examination. Photoelectric Plethysmograph (PPG) waveforms and toe systolic pressure readings are included as required and additional duplex testing as needed. Limited examinations for reoccurring indications may be performed as noted.  ABI Findings: +--------+------------------+-----+---------+--------+ Right   Rt Pressure (mmHg)IndexWaveform Comment  +--------+------------------+-----+---------+--------+ [id number]                    triphasic         +--------+------------------+-----+---------+--------+ PTA     194               1.24 triphasic         +--------+------------------+-----+---------+--------+ PERO    195               1.24 triphasic         +--------+------------------+-----+---------+--------+ +--------+------------------+-----+---------+-------+ Left    Lt Pressure (mmHg)IndexWaveform Comment  +--------+------------------+-----+---------+-------+ Brachial                       triphasic        +--------+------------------+-----+---------+-------+ PTA     170               1.08 triphasic        +--------+------------------+-----+---------+-------+ PERO    160               1.02 triphasic        +--------+------------------+-----+---------+-------+ +-------+-----------+-----------+------------+------------+ ABI/TBIToday's ABIToday's TBIPrevious ABIPrevious TBI +-------+-----------+-----------+------------+------------+ Right  1.24                                           +-------+-----------+-----------+------------+------------+ Left   1.08                                           +-------+-----------+-----------+------------+------------+  Summary: Right: Resting right ankle-brachial index is within normal range. No evidence of significant right lower extremity arterial disease. Left: Resting left ankle-brachial index is within normal range. No evidence of significant left lower extremity arterial disease.  *See table(s) above for measurements and observations.  Electronically signed by Ruta Hinds MD on 03/27/2018 at 6:52:51 PM.   Final     Microbiology: No results found for this or any previous visit (from the past 240 hour(s)).   Labs: Basic Metabolic Panel: Recent Labs  Lab 03/24/18 1812 03/26/18 0546 03/27/18 0341  NA 139 138 139  K 3.7 3.5 3.7  CL 98 99 101  CO2 35* 32 34*  GLUCOSE 112* 98 165*  BUN 26* 29* 32*  CREATININE 1.99* 1.83* 1.83*  CALCIUM 8.7* 8.4* 8.5*   Liver Function Tests: Recent Labs  Lab 03/24/18 1812  AST 19  ALT 24  ALKPHOS 82  BILITOT 1.6*  PROT 6.7  ALBUMIN 2.8*   No results for input(s): LIPASE, AMYLASE in the last 168 hours. No results for input(s): AMMONIA in the last 168 hours. CBC: Recent Labs  Lab 03/24/18 1812 03/26/18 1009  WBC 8.8 5.0  NEUTROABS 5.9  --   HGB 12.5* 11.3*  HCT 38.2* 36.6*  MCV 97.7 96.6  PLT 234 230   Cardiac Enzymes: No results for input(s): CKTOTAL, CKMB, CKMBINDEX, TROPONINI in the last 168 hours. BNP: BNP (last 3 results) Recent Labs    12/27/17 2354 02/15/18 1559 03/24/18 1813  BNP 283.3* 847.1* 285.1*    ProBNP (last 3 results) No results for input(s): PROBNP in the last 8760 hours.  CBG: Recent Labs  Lab 03/25/18 0222 03/25/18 0646 03/25/18 2121 03/26/18 0627 03/26/18 2130  GLUCAP 81 94 99 97 157*       Signed:  Alma Friendly, MD [facility] 03/28/2018, 1:37 PM

## 2018-03-28 NOTE — Progress Notes (Signed)
Physical Therapy Treatment Patient Details Name: Jay Chen MRN: [id number] DOB: [birth year] Today's Date: 03/28/2018    History of Present Illness 52 yo male admitted for B foot pain and severe edema and was referred to PT for mobility assessment.  Has 8-10 pain standing on LLE and 7-8 pain standing on RLE.  PMHx:  CHF with volume overload, DCCV 10/2, AAA, CVA, HTN, DM, CKD 3, EF 20-25%, morbid obesity, a-fib, cor pulmonale,     PT Comments    Patient seen for mobility progression. Pt reports decreased pain and tolerated gait/stair training this session. Pt will continue to benefit from further skilled PT services to maximize independence and safety with mobility. Given pt's progress recommending HHPT upon d/c.     Follow Up Recommendations  Home health PT     Equipment Recommendations  None recommended by PT    Recommendations for Other Services       Precautions / Restrictions Precautions Precautions: Fall    Mobility  Bed Mobility               General bed mobility comments: Pt OOB in chair upon arrival  Transfers Overall transfer level: Needs assistance Equipment used: Rolling walker (2 wheeled) Transfers: Sit to/from Stand Sit to Stand: Min guard         General transfer comment: for safety  Ambulation/Gait Ambulation/Gait assistance: Min guard Gait Distance (Feet): 150 Feet Assistive device: Rolling walker (2 wheeled) Gait Pattern/deviations: Step-through pattern;Decreased stride length     General Gait Details: slow, steady gait; reports feeling bilat LE weakness   Stairs Stairs: Yes Stairs assistance: Min guard Stair Management: Two rails;Alternating pattern;Forwards Number of Stairs: (2 steps X 2) General stair comments: min guard for safety   Wheelchair Mobility    Modified Rankin (Stroke Patients Only)       Balance Overall balance assessment: Needs assistance Sitting-balance support: Feet supported;Bilateral upper extremity  supported Sitting balance-Leahy Scale: Good     Standing balance support: Bilateral upper extremity supported;During functional activity Standing balance-Leahy Scale: Poor Standing balance comment: Reliant on UE support                            Cognition Arousal/Alertness: Awake/alert Behavior During Therapy: WFL for tasks assessed/performed Overall Cognitive Status: Within Functional Limits for tasks assessed                                        Exercises      General Comments        Pertinent Vitals/Pain Pain Assessment: Faces Faces Pain Scale: Hurts a little bit Pain Location: bilat ankles Pain Descriptors / Indicators: Discomfort Pain Intervention(s): Monitored during session    Home Living                      Prior Function            PT Goals (current goals can now be found in the care plan section) Progress towards PT goals: Progressing toward goals    Frequency    Min 2X/week      PT Plan Discharge plan needs to be updated    Co-evaluation              AM-PAC PT "6 Clicks" Daily Activity  Outcome Measure  Difficulty turning over in bed (including adjusting bedclothes,  sheets and blankets)?: A Little Difficulty moving from lying on back to sitting on the side of the bed? : A Lot Difficulty sitting down on and standing up from a chair with arms (e.g., wheelchair, bedside commode, etc,.)?: Unable Help needed moving to and from a bed to chair (including a wheelchair)?: A Little Help needed walking in hospital room?: A Little Help needed climbing 3-5 steps with a railing? : A Little 6 Click Score: 15    End of Session Equipment Utilized During Treatment: Gait belt Activity Tolerance: Patient tolerated treatment well Patient left: with call bell/phone within reach;in chair Nurse Communication: Mobility status PT Visit Diagnosis: Unsteadiness on feet (R26.81);Pain;Difficulty in walking, not elsewhere  classified (R26.2) Pain - Right/Left: (Bilateral) Pain - part of body: Ankle and joints of foot     Time: 2111-5520 PT Time Calculation (min) (ACUTE ONLY): 11 min  Charges:  $Gait Training: 8-22 mins                     Earney Navy, PTA Acute Rehabilitation Services Pager: [phone] Office: [phone]     Darliss Cheney 03/28/2018, 9:58 AM

## 2018-03-28 NOTE — Progress Notes (Signed)
Patient will discharge home with home health PT. RNCM informed.  Clinical Social Worker will sign off for now as social work intervention os no longer needed. Please consult [place] again if new needs arises.  Thurmond Butts, [place] Social Worker [phone]

## 2018-03-28 NOTE — Care Management Note (Signed)
Case Management Note  Patient Details  Name: Jay Chen MRN: [id number] Date of Birth: November 13, 1965  Subjective/Objective:   Bilateral foot pain, gouty arthritis, CHF, CKD,                  Action/Plan: Spoke to pt and he states he was active with [facility] for [facility], PT, OT and SW. Contacted [facility] to make aware plan is to dc home with resuming HH. Notified attending for resumption of care orders. He has RW and bedside commode at home. Wife and adult children assist in evening. His mother assist him during the day. Pt declines SNF and wheelchair at this time.   Expected Discharge Date:  03/28/2018               Expected Discharge Plan:  [place]  In-House Referral:  Clinical Social Work  Discharge planning Services  CM Consult  Post Acute Care Choice:  Home Health, Resumption of Svcs/PTA Provider   Choice offered to:  Patient  DME Arranged:  N/A DME Agency:  NA  HH Arranged:  PT, OT, RN, Social Work, declines SNF [place] Agency:  [place]  Status of Service:  Completed, signed off  If discussed at [employer], dates discussed:    Additional Comments:  Erenest Rasher, RN 03/28/2018, 10:26 AM

## 2018-03-31 ENCOUNTER — Other Ambulatory Visit: Payer: Self-pay | Admitting: Physical Medicine and Rehabilitation

## 2018-04-03 ENCOUNTER — Telehealth: Payer: Self-pay | Admitting: *Deleted

## 2018-04-03 ENCOUNTER — Encounter: Payer: BC Managed Care – PPO | Admitting: Physical Medicine & Rehabilitation

## 2018-04-03 NOTE — Telephone Encounter (Signed)
Merrilee Seashore PT [facility] called to report missed visit with Jay Chen(per their protocol)  due to conflict of appts with his wife so he could not be home.

## 2018-04-04 ENCOUNTER — Other Ambulatory Visit (INDEPENDENT_AMBULATORY_CARE_PROVIDER_SITE_OTHER): Payer: BC Managed Care – PPO

## 2018-04-04 ENCOUNTER — Ambulatory Visit: Payer: BC Managed Care – PPO | Admitting: Internal Medicine

## 2018-04-04 ENCOUNTER — Encounter: Payer: Self-pay | Admitting: Internal Medicine

## 2018-04-04 VITALS — BP 138/80 | HR 96 | Temp 98.0°F | Resp 16 | Ht 68.0 in | Wt 336.2 lb

## 2018-04-04 DIAGNOSIS — Z0000 Encounter for general adult medical examination without abnormal findings: Secondary | ICD-10-CM | POA: Diagnosis not present

## 2018-04-04 DIAGNOSIS — N184 Chronic kidney disease, stage 4 (severe): Secondary | ICD-10-CM

## 2018-04-04 DIAGNOSIS — D539 Nutritional anemia, unspecified: Secondary | ICD-10-CM

## 2018-04-04 DIAGNOSIS — M1000 Idiopathic gout, unspecified site: Secondary | ICD-10-CM | POA: Diagnosis not present

## 2018-04-04 DIAGNOSIS — E538 Deficiency of other specified B group vitamins: Secondary | ICD-10-CM

## 2018-04-04 DIAGNOSIS — I10 Essential (primary) hypertension: Secondary | ICD-10-CM

## 2018-04-04 DIAGNOSIS — I480 Paroxysmal atrial fibrillation: Secondary | ICD-10-CM

## 2018-04-04 DIAGNOSIS — R195 Other fecal abnormalities: Secondary | ICD-10-CM | POA: Diagnosis not present

## 2018-04-04 LAB — CBC WITH DIFFERENTIAL/PLATELET
BASOS ABS: 0.1 10*3/uL (ref 0.0–0.1)
BASOS PCT: 1.2 % (ref 0.0–3.0)
Eosinophils Absolute: 0.2 10*3/uL (ref 0.0–0.7)
Eosinophils Relative: 3.3 % (ref 0.0–5.0)
HEMATOCRIT: 39.5 % (ref 39.0–52.0)
Hemoglobin: 13.1 g/dL (ref 13.0–17.0)
LYMPHS PCT: 20.1 % (ref 12.0–46.0)
Lymphs Abs: 1.5 10*3/uL (ref 0.7–4.0)
MCHC: 33.2 g/dL (ref 30.0–36.0)
MCV: 94.2 fl (ref 78.0–100.0)
MONOS PCT: 11.4 % (ref 3.0–12.0)
Monocytes Absolute: 0.9 10*3/uL (ref 0.1–1.0)
NEUTROS ABS: 4.8 10*3/uL (ref 1.4–7.7)
Neutrophils Relative %: 64 % (ref 43.0–77.0)
PLATELETS: 321 10*3/uL (ref 150.0–400.0)
RBC: 4.2 Mil/uL — ABNORMAL LOW (ref 4.22–5.81)
RDW: 15.9 % — AB (ref 11.5–15.5)
WBC: 7.5 10*3/uL (ref 4.0–10.5)

## 2018-04-04 LAB — IBC PANEL
Iron: 71 ug/dL (ref 42–165)
Saturation Ratios: 26.7 % (ref 20.0–50.0)
Transferrin: 190 mg/dL — ABNORMAL LOW (ref 212.0–360.0)

## 2018-04-04 LAB — BASIC METABOLIC PANEL
BUN: 36 mg/dL — AB (ref 6–23)
CALCIUM: 8.6 mg/dL (ref 8.4–10.5)
CO2: 33 mEq/L — ABNORMAL HIGH (ref 19–32)
CREATININE: 1.59 mg/dL — AB (ref 0.40–1.50)
Chloride: 101 mEq/L (ref 96–112)
GFR: 58.6 mL/min — AB (ref >60.00)
GLUCOSE: 103 mg/dL — AB (ref 70–99)
Potassium: 3.5 mEq/L (ref 3.5–5.1)
Sodium: 143 mEq/L (ref 135–145)

## 2018-04-04 LAB — FOLATE: Folate: 13.6 ng/mL (ref >5.9)

## 2018-04-04 LAB — PSA: PSA: 3.87 ng/mL (ref 0.10–4.00)

## 2018-04-04 LAB — URIC ACID: URIC ACID, SERUM: 8.8 mg/dL — AB (ref 4.0–7.8)

## 2018-04-04 LAB — TSH: TSH: 2.37 u[IU]/mL (ref 0.35–4.50)

## 2018-04-04 LAB — VITAMIN B12: Vitamin B-12: 208 pg/mL — ABNORMAL LOW (ref 211–911)

## 2018-04-04 LAB — FERRITIN: FERRITIN: 297.3 ng/mL (ref 22.0–322.0)

## 2018-04-04 MED ORDER — ALLOPURINOL 100 MG PO TABS: 100.0000 mg | ORAL_TABLET | Freq: Every day | ORAL | 1 refills | Status: DC

## 2018-04-04 NOTE — Progress Notes (Signed)
Subjective:  Patient ID: Jay Chen, male    DOB: [birth year]  Age: 52 y.o. MRN: [id number]  CC: Anemia; Annual Exam; Hypertension; and Diabetes   HPI Jay Chen presents for a CPX.  He was recently admitted for lower extremity pain.  No specific cause was identified.  There was some concern it might be related to gout.  He tells me his lower extremity pain has resolved.  He was found to be anemic during the admission.  He is not aware of any sources of blood loss.  Outpatient Medications Prior to Visit  Medication Sig Dispense Refill  . acetaminophen (TYLENOL) 325 MG tablet Take 1-2 tablets (325-650 mg total) by mouth every 6 (six) hours as needed for mild pain.    [name] amiodarone (PACERONE) 200 MG tablet Take 1 tablet (200 mg total) by mouth daily. 30 tablet 3  . aspirin 81 MG EC tablet Take 1 tablet (81 mg total) by mouth daily.    [name] atorvastatin (LIPITOR) 80 MG tablet Take 1 tablet (80 mg total) by mouth daily. 30 tablet 1  . hydrALAZINE (APRESOLINE) 100 MG tablet Take 1 tablet (100 mg total) by mouth 3 (three) times daily. 90 tablet 3  . isosorbide mononitrate (IMDUR) 30 MG 24 hr tablet Take 1 tablet (30 mg total) by mouth daily. 30 tablet 0  . metoprolol succinate (TOPROL-XL) 25 MG 24 hr tablet Take 3 tablets (75 mg total) by mouth daily. 90 tablet 0  . omega-3 acid ethyl esters (LOVAZA) 1 g capsule Take 2 capsules (2 g total) by mouth 2 (two) times daily. 120 capsule 11  . potassium chloride SA (KLOR-CON M20) 20 MEQ tablet Take 3 tablets (60 mEq total) by mouth daily. (Patient taking differently: Take 2 mEq by mouth 2 (two) times daily. ) 90 tablet 3  . rivaroxaban (XARELTO) 20 MG TABS tablet Take 1 tablet (20 mg total) by mouth daily with supper. 90 tablet 1  . torsemide (DEMADEX) 20 MG tablet Take 2 tablets (40 mg total) by mouth every morning. May also take 1 tablet (20 mg total) as needed (for weight 343 or greater). 90 tablet 3  . allopurinol (ZYLOPRIM) 100 MG tablet Take 1  tablet (100 mg total) by mouth daily. 90 tablet 0   No facility-administered medications prior to visit.     ROS Review of Systems  Constitutional: Positive for fatigue. Negative for appetite change, diaphoresis and unexpected weight change.  HENT: Negative.   Eyes: Negative for visual disturbance.  Respiratory: Positive for apnea and shortness of breath. Negative for cough, chest tightness and wheezing.   Cardiovascular: Positive for leg swelling. Negative for chest pain and palpitations.  Gastrointestinal: Negative for abdominal pain, blood in stool, constipation, diarrhea, nausea and vomiting.  Genitourinary: Negative.  Negative for difficulty urinating, penile swelling, scrotal swelling and testicular pain.  Musculoskeletal: Negative.  Negative for arthralgias, back pain, myalgias and neck pain.  Skin: Negative.  Negative for color change and pallor.  Neurological: Negative.   Hematological: Negative for adenopathy. Does not bruise/bleed easily.  Psychiatric/Behavioral: Negative.     Objective:  BP 138/80 (BP Location: Left Arm, Patient Position: Sitting, Cuff Size: Large)   Pulse 96   Temp 98 F (36.7 C) (Oral)   Resp 16   Ht 5\' 8"  (1.727 m)   Wt (!) 336 lb 4 oz (152.5 kg)   SpO2 91%   BMI 51.13 kg/m   BP Readings from Last 3 Encounters:  04/04/18 138/80  03/28/18 115/74  03/14/18 124/77    Wt Readings from Last 3 Encounters:  04/04/18 (!) 336 lb 4 oz (152.5 kg)  03/25/18 (!) 334 lb 3.5 oz (151.6 kg)  03/14/18 (!) 341 lb (154.7 kg)    Physical Exam  Constitutional: He is oriented to person, place, and time. No distress.  HENT:  Mouth/Throat: Oropharynx is clear and moist. No oropharyngeal exudate.  Eyes: Conjunctivae are normal. No scleral icterus.  Neck: Normal range of motion. Neck supple. No JVD present. No thyromegaly present.  Cardiovascular: Normal rate, regular rhythm and normal heart sounds. Exam reveals no gallop.  No murmur heard. Pulmonary/Chest:  Effort normal and breath sounds normal. No respiratory distress. He has no wheezes. He has no rales.  Abdominal: Soft. Normal appearance and bowel sounds are normal. He exhibits no mass. There is no hepatosplenomegaly. There is no tenderness. Hernia confirmed negative in the right inguinal area and confirmed negative in the left inguinal area.  Genitourinary: Prostate normal, testes normal and penis normal. Rectal exam shows guaiac positive stool. Rectal exam shows no external hemorrhoid, no internal hemorrhoid, no fissure, no mass, no tenderness and anal tone normal. Prostate is not enlarged and not tender. Right testis shows no mass, no swelling and no tenderness. Left testis shows no mass, no swelling and no tenderness. Uncircumcised. No phimosis, paraphimosis, hypospadias, penile erythema or penile tenderness. No discharge found.  Musculoskeletal: Normal range of motion. He exhibits edema (1+ LE pitting edema). He exhibits no tenderness or deformity.  Lymphadenopathy:    He has no cervical adenopathy. No inguinal adenopathy noted on the right or left side.  Neurological: He is alert and oriented to person, place, and time.  Skin: Skin is warm and dry. No rash noted. He is not diaphoretic. No erythema. No pallor.  Vitals reviewed.   Lab Results  Component Value Date   WBC 7.5 04/04/2018   HGB 13.1 04/04/2018   HCT 39.5 04/04/2018   PLT 321.0 04/04/2018   GLUCOSE 103 (H) 04/04/2018   CHOL 116 01/01/2018   TRIG 85 01/01/2018   HDL 24 (L) 01/01/2018   LDLDIRECT 104.0 10/12/2016   LDLCALC 75 01/01/2018   ALT 24 03/24/2018   AST 19 03/24/2018   NA 143 04/04/2018   K 3.5 04/04/2018   CL 101 04/04/2018   CREATININE 1.59 (H) 04/04/2018   BUN 36 (H) 04/04/2018   CO2 33 (H) 04/04/2018   TSH 2.37 04/04/2018   PSA 3.87 04/04/2018   INR 1.01 09/19/2009   HGBA1C 7.1 (A) 01/24/2018   MICROALBUR 31.2 (H) 09/01/2017    Dg Chest Port 1 View  Result Date: 03/24/2018 CLINICAL DATA:  Volume  overload.  Bilateral foot swelling EXAM: PORTABLE CHEST 1 VIEW COMPARISON:  02/16/2018 FINDINGS: Cardiac enlargement. Mild vascular congestion without edema. Left lower lobe density compatible with atelectasis and possibly effusion. IMPRESSION: Mild vascular congestion without edema Left lower lobe consolidation most likely atelectasis. Electronically Signed   By: Franchot Gallo M.D.   On: 03/24/2018 19:20    Assessment & Plan:   Colbin was seen today for anemia, annual exam, hypertension and diabetes.  Diagnoses and all orders for this visit:  Deficiency anemia- His H&H are normal now.  His iron level is normal but his B12 level is low. -     CBC with Differential/Platelet; Future -     Vitamin B12; Future -     IBC panel; Future -     Folate; Future -  Ferritin; Future -     Vitamin B1; Future -     Ambulatory referral to Gastroenterology  CKD (chronic kidney disease) stage 4, GFR 15-29 ml/min (HCC)-his renal function is actually normal. -     Basic metabolic panel; Future  Routine general medical examination at a health care facility- Exam completed, labs reviewed, vaccines reviewed and updated, he is referred to GI for colon cancer screening, patient education material was given. -     PSA; Future  Essential hypertension-his blood pressure is adequately well controlled.  Electrolytes are normal, renal function is stable. -     TSH; Future  Occult blood in stools -     Ambulatory referral to Gastroenterology  PAF (paroxysmal atrial fibrillation) ([place]) - He is maintaining sinus rhythm.  Will continue anticoagulation with Xarelto.  Idiopathic gout, unspecified chronicity, unspecified site- His uric acid level remains elevated.  I have asked him to be certain that he is compliant with allopurinol. -     Uric acid; Future -     allopurinol (ZYLOPRIM) 100 MG tablet; Take 1 tablet (100 mg total) by mouth daily.  B12 deficiency- I have asked him to start monthly parenteral B12  basement.   I am having [name] maintain his atorvastatin, omega-3 acid ethyl esters, rivaroxaban, aspirin, acetaminophen, isosorbide mononitrate, metoprolol succinate, amiodarone, hydrALAZINE, torsemide, potassium chloride SA, and allopurinol.  Meds ordered this encounter  Medications  . allopurinol (ZYLOPRIM) 100 MG tablet    Sig: Take 1 tablet (100 mg total) by mouth daily.    Dispense:  90 tablet    Refill:  1     Follow-up: Return in about 3 months (around 07/05/2018).  Scarlette Calico, MD

## 2018-04-04 NOTE — Patient Instructions (Signed)

## 2018-04-05 MED ORDER — CYANOCOBALAMIN 1000 MCG/ML IJ SOLN
1000.0000 ug | Freq: Once | INTRAMUSCULAR | Status: AC
  Administered 2018-04-04: 1000 ug via INTRAMUSCULAR

## 2018-04-05 NOTE — Addendum Note (Signed)
Addended by: Karle Barr on: 04/05/2018 08:44 AM   Modules accepted: Orders

## 2018-04-06 ENCOUNTER — Telehealth: Payer: Self-pay | Admitting: Adult Health

## 2018-04-06 MED ORDER — ISOSORBIDE MONONITRATE ER 30 MG PO TB24: 30.0000 mg | ORAL_TABLET | Freq: Every day | ORAL | 4 refills | Status: DC

## 2018-04-06 MED ORDER — METOPROLOL SUCCINATE ER 25 MG PO TB24: 75.0000 mg | ORAL_TABLET | Freq: Every day | ORAL | 0 refills | Status: DC

## 2018-04-06 NOTE — Telephone Encounter (Signed)
LOV KL 9-25: He was last seen in the office on 01/25/2018 and was noted to have uncontrolled atrial fib HR. Metoprolol was increased to 150 mg daily from 100 mg daily  9-26 BENSIMHON consult note in the hospital: Seen in [facility] office 01/25/18 and was noted to have uncontrolled atrial fib HR. Metoprolol was increased to 150 mg daily from 100 mg daily.    D/w KL today pt should be taking metoprolol 75mg  QD.   Pt notified, he will go to pharmacy and pick up new rx.  S/w [facility] pharmacy he will make sure that pt gets the 25mg  tab 3 tab daily(75mg QD).

## 2018-04-06 NOTE — Telephone Encounter (Signed)
New message    *STAT* If patient is at the pharmacy, call can be transferred to refill team.   1. Which medications need to be refilled? (please list name of each medication and dose if known)   isosorbide mononitrate (IMDUR) 30 MG 24 hr tablet     2. Which pharmacy/location (including street and city if local pharmacy) is medication to be sent to?[place], [place]  3. Do they need a 30 day or 90 day supply?[place]

## 2018-04-06 NOTE — Telephone Encounter (Signed)
New message   Pt c/o medication issue:  1. Name of Medication: metoprolol succinate (TOPROL-XL) 25 MG 24 hr tablet  2. How are you currently taking this medication (dosage and times per day)? 1 time daily  3. Are you having a reaction (difficulty breathing--STAT)? n/a  4. What is your medication issue? Patient needs to know what the correct mg is for this medication that he should be taking? Please advise.

## 2018-04-06 NOTE — Telephone Encounter (Signed)
Returned call to patient.He stated he is calling to verify Metoprolol dose.Stated he has been taking 25 mg ( 3 ) tablets daily.Stated when he picked up prescription last night it was 100 mg tablet with directions take 1&1/2 tablets daily.Stated he wanted to ask Jory Sims DNP if she wanted him to increase.Stated his B/P has been normal at home.Stated when he saw her last she did not mentioned to increase.Message sent to Jory Sims DNP for advice.

## 2018-04-08 LAB — VITAMIN B1: Vitamin B1 (Thiamine): 17 nmol/L (ref 8–30)

## 2018-04-10 ENCOUNTER — Encounter (HOSPITAL_COMMUNITY): Payer: Self-pay

## 2018-04-10 ENCOUNTER — Ambulatory Visit (HOSPITAL_COMMUNITY)
Admission: RE | Admit: 2018-04-10 | Discharge: 2018-04-10 | Disposition: A | Discharge status: Home / Self Care | Payer: BC Managed Care – PPO | Source: Ambulatory Visit | Attending: Cardiology | Admitting: Cardiology

## 2018-04-10 ENCOUNTER — Other Ambulatory Visit: Payer: Self-pay

## 2018-04-10 VITALS — BP 132/82 | HR 91 | Wt 334.0 lb

## 2018-04-10 DIAGNOSIS — Z8673 Personal history of transient ischemic attack (TIA), and cerebral infarction without residual deficits: Secondary | ICD-10-CM | POA: Insufficient documentation

## 2018-04-10 DIAGNOSIS — Z79899 Other long term (current) drug therapy: Secondary | ICD-10-CM | POA: Diagnosis not present

## 2018-04-10 DIAGNOSIS — Z7982 Long term (current) use of aspirin: Secondary | ICD-10-CM | POA: Diagnosis not present

## 2018-04-10 DIAGNOSIS — I5043 Acute on chronic combined systolic (congestive) and diastolic (congestive) heart failure: Secondary | ICD-10-CM | POA: Diagnosis not present

## 2018-04-10 DIAGNOSIS — I428 Other cardiomyopathies: Secondary | ICD-10-CM | POA: Insufficient documentation

## 2018-04-10 DIAGNOSIS — E785 Hyperlipidemia, unspecified: Secondary | ICD-10-CM | POA: Diagnosis not present

## 2018-04-10 DIAGNOSIS — Z6841 Body Mass Index (BMI) 40.0 and over, adult: Secondary | ICD-10-CM | POA: Insufficient documentation

## 2018-04-10 DIAGNOSIS — Z8249 Family history of ischemic heart disease and other diseases of the circulatory system: Secondary | ICD-10-CM | POA: Insufficient documentation

## 2018-04-10 DIAGNOSIS — I5042 Chronic combined systolic (congestive) and diastolic (congestive) heart failure: Secondary | ICD-10-CM | POA: Diagnosis not present

## 2018-04-10 DIAGNOSIS — Z8042 Family history of malignant neoplasm of prostate: Secondary | ICD-10-CM | POA: Diagnosis not present

## 2018-04-10 DIAGNOSIS — M109 Gout, unspecified: Secondary | ICD-10-CM | POA: Insufficient documentation

## 2018-04-10 DIAGNOSIS — I10 Essential (primary) hypertension: Secondary | ICD-10-CM

## 2018-04-10 DIAGNOSIS — Z800 Family history of malignant neoplasm of digestive organs: Secondary | ICD-10-CM | POA: Diagnosis not present

## 2018-04-10 DIAGNOSIS — I130 Hypertensive heart and chronic kidney disease with heart failure and stage 1 through stage 4 chronic kidney disease, or unspecified chronic kidney disease: Secondary | ICD-10-CM | POA: Diagnosis not present

## 2018-04-10 DIAGNOSIS — E1122 Type 2 diabetes mellitus with diabetic chronic kidney disease: Secondary | ICD-10-CM | POA: Insufficient documentation

## 2018-04-10 DIAGNOSIS — Z7901 Long term (current) use of anticoagulants: Secondary | ICD-10-CM | POA: Diagnosis not present

## 2018-04-10 DIAGNOSIS — G4733 Obstructive sleep apnea (adult) (pediatric): Secondary | ICD-10-CM

## 2018-04-10 DIAGNOSIS — N183 Chronic kidney disease, stage 3 (moderate): Secondary | ICD-10-CM | POA: Diagnosis not present

## 2018-04-10 DIAGNOSIS — L97919 Non-pressure chronic ulcer of unspecified part of right lower leg with unspecified severity: Secondary | ICD-10-CM | POA: Diagnosis not present

## 2018-04-10 DIAGNOSIS — Z87442 Personal history of urinary calculi: Secondary | ICD-10-CM | POA: Insufficient documentation

## 2018-04-10 DIAGNOSIS — R600 Localized edema: Secondary | ICD-10-CM

## 2018-04-10 DIAGNOSIS — I714 Abdominal aortic aneurysm, without rupture: Secondary | ICD-10-CM | POA: Diagnosis not present

## 2018-04-10 DIAGNOSIS — L97929 Non-pressure chronic ulcer of unspecified part of left lower leg with unspecified severity: Secondary | ICD-10-CM

## 2018-04-10 DIAGNOSIS — R0683 Snoring: Secondary | ICD-10-CM

## 2018-04-10 DIAGNOSIS — I712 Thoracic aortic aneurysm, without rupture: Secondary | ICD-10-CM | POA: Diagnosis not present

## 2018-04-10 DIAGNOSIS — I480 Paroxysmal atrial fibrillation: Secondary | ICD-10-CM | POA: Diagnosis not present

## 2018-04-10 NOTE — Patient Instructions (Signed)
You have been referred to [facility] for in home wound care, they will be in contact with you to arrange a initial home visit.  Your physician recommends that you schedule a follow-up appointment in: 2-3 months with Dr Aundra Dubin  Do the following things EVERYDAY: 1) Weigh yourself in the morning before breakfast. Write it down and keep it in a log. 2) Take your medicines as prescribed 3) Eat low salt foods-Limit salt (sodium) to 2000 mg per day.  4) Stay as active as you can everyday 5) Limit all fluids for the day to less than 2 liters

## 2018-04-10 NOTE — Progress Notes (Signed)
PCP: Dr [name]  Primary HF Cardiologist: Dr Vaughan Browner  Cardiology: Dr Gwenlyn Found   HPI: Jay Chen is a 52 y.o. 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 Diuretics held on the day of discharge due to increased creatinine. He underwent RHC/LHC with normal cors and moderate mixed pulmonary venous/arterial HTN.  HF medications optimized. He was not placed on spiro or arb due to elevated creatinine. Once diuresed he underwent successful DC-CV. He was discharged to inpatient rehab. He was discharged from rehab on 03/01/18. On the day of discharge he was sent for successful cardioversion.   Admitted 11/1 - 03/28/18 with BLE pain. Work up unremarkable apart from chronic Arthritis/gout. Treated with allopurinol/prednisone. Continued on home torsemide.  He presents today for post hospital follow up. Feeling good overall. Has an open wound with clear drainage on LLE. Has history of same, previously treated in hospital. States he scraped the scab off on his pants this weekend. He had been followed by [facility] PT/RN until last week. Weight at home stable. Taking all medications as directed. Denies PND/Orthopnea. Denies SOB with exertion. No further gout flare.   RHC/LHC 02/20/2018 Ao = 125/87 (104) LV = 122/23  RA = 17  RV = 56/16 PA = 52/35 (42) PCW = 21 Fick cardiac output/index = 7.0.2.7 SVR = 993 PVR = 3.0 WU Ao sat = 92% PA sat = 66%, 65%  Assessment: 1. Normal coronaries 2. 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 Heart appears rotated 90% leftward on cath images 4. Moderate mixed pulmonary venous/arterial HTN  ECHO 12/2017 Left ventricle: The cavity size was moderately to severely  dilated. Wall thickness was increased in a pattern of moderate   LVH. Systolic function was severely reduced. The estimated   ejection fraction was in the range of 20% to 25%. Diffuse   hypokinesis. The study is not technically sufficient to allow   evaluation of LV diastolic function. - Aortic valve: Mildly calcified annulus. Valve area (VTI): 4.07   cm^2. Valve area (Vmax): 4.14 cm^2. Valve area (Vmean): 4.16   cm^2. - Aorta: There is a single still frame view of a portion of the   proximal ascending that is dilated measuring 4.7 cm, consistent   with moderate to large aneurysm. From chart review a similar   aneurysm was detected by CT scan 03/28/2012. Limited evaluation by   echo, consider alternative modality if more complete evaluation   is indicated. - Left atrium: The atrium was severely dilated. - Right ventricle: The cavity size was mildly dilated. Systolic   function was mildly reduced. - Right atrium: The atrium was mildly to moderately dilated. - Atrial septum: No defect or patent foramen ovale was identified. - Inferior vena cava: The vessel was dilated. The respirophasic   diameter changes were blunted (< 50%), consistent with elevated   central venous pressure. - Technically difficult study. Echocontrast was used to enhance   visualization.  Review of systems complete and found to be negative unless listed in HPI.    SH:  Social History   Socioeconomic History  . Marital status: Married    Spouse name: Not on file  . Number of children: 2  .  Years of education: Not on file  . Highest education level: Bachelor's degree (e.g., BA, AB, BS)  Occupational History  . Occupation: unemployed  Social Needs  . Financial resource strain: Not hard at all  . Food insecurity:    Worry: Never true    Inability: Never true  . Transportation needs:    Medical: No    Non-medical: No  Tobacco Use  . Smoking status: Never Smoker  . Smokeless tobacco: Never Used  Substance  and Sexual Activity  . Alcohol use: Not Currently    Comment: 02/15/2018 "nothing in the last couple years"  . Drug use: Never  . Sexual activity: Yes  Lifestyle  . Physical activity:    Days per week: Not on file    Minutes per session: Not on file  . Stress: Not on file  Relationships  . Social connections:    Talks on phone: Not on file    Gets together: Not on file    Attends religious service: Not on file    Active member of club or organization: Not on file    Attends meetings of clubs or organizations: Not on file    Relationship status: Not on file  . Intimate partner violence:    Fear of current or ex partner: Not on file    Emotionally abused: Not on file    Physically abused: Not on file    Forced sexual activity: Not on file  Other Topics Concern  . Not on file  Social History Narrative  . Not on file    FH:  Family History  Problem Relation Age of Onset  . Hypertension Mother   . Pancreatic cancer Father   . Prostate cancer Father   . Colon cancer Father     Past Medical History:  Diagnosis Date  . CKD (chronic kidney disease), stage III ([place])   . Congestive heart failure [facility]) May of 2011   Felt to have cor pulmonale; EF 45 to 50% from echo in May 2011  . Cor pulmonale (chronic) ([place])   . Gout    "take daily RX" (02/15/2018)  . History of kidney stones   . Hyperlipidemia   . Hypertension   . Morbid obesity ([place])   . Persistent atrial fibrillation    [name] 12/28/2017  . Sleep apnea    "dx'd; couldn't tolerate CPAP" (02/15/2018)  . Thoracic aneurysm    a. 4.8cm thoracic aortic aneurysm by CT 2013.  [name] Thoracic aortic aneurysm ([place])    known/notes 12/28/2017  . Type II diabetes mellitus ([place])     Current Outpatient Medications  Medication Sig Dispense Refill  . allopurinol (ZYLOPRIM) 100 MG tablet Take 1 tablet (100 mg total) by mouth daily. 90 tablet 1  . amiodarone (PACERONE) 200 MG tablet Take 1 tablet (200 mg total) by mouth daily. 30 tablet 3    . aspirin 81 MG EC tablet Take 1 tablet (81 mg total) by mouth daily.    [name] atorvastatin (LIPITOR) 80 MG tablet Take 1 tablet (80 mg total) by mouth daily. 30 tablet 1  . hydrALAZINE (APRESOLINE) 100 MG tablet Take 1 tablet (100 mg total) by mouth 3 (three) times daily. 90 tablet 3  . isosorbide mononitrate (IMDUR) 30 MG 24 hr tablet Take 1 tablet (30 mg total) by mouth daily. 30 tablet 4  . metoprolol succinate (TOPROL-XL) 25 MG 24 hr tablet Take 3 tablets (75 mg total) by mouth daily. 90 tablet 0  . potassium chloride  SA (KLOR-CON M20) 20 MEQ tablet Take 3 tablets (60 mEq total) by mouth daily. (Patient taking differently: Take 2 mEq by mouth 2 (two) times daily. ) 90 tablet 3  . rivaroxaban (XARELTO) 20 MG TABS tablet Take 1 tablet (20 mg total) by mouth daily with supper. 90 tablet 1  . torsemide (DEMADEX) 20 MG tablet Take 2 tablets (40 mg total) by mouth every morning. May also take 1 tablet (20 mg total) as needed (for weight 343 or greater). 90 tablet 3  . acetaminophen (TYLENOL) 325 MG tablet Take 1-2 tablets (325-650 mg total) by mouth every 6 (six) hours as needed for mild pain.    [name] omega-3 acid ethyl esters (LOVAZA) 1 g capsule Take 2 capsules (2 g total) by mouth 2 (two) times daily. 120 capsule 11   No current facility-administered medications for this encounter.     Vitals:   04/10/18 1127  BP: 132/82  Pulse: 91  SpO2: 93%  Weight: (!) 151.5 kg (334 lb)   Wt Readings from Last 3 Encounters:  04/10/18 (!) 151.5 kg (334 lb)  04/04/18 (!) 152.5 kg (336 lb 4 oz)  03/25/18 (!) 151.6 kg (334 lb 3.5 oz)     PHYSICAL EXAM: General: Ambulated into clinic with a walker. No resp difficulty. HEENT: Normal Neck: Supple. JVP 5-6. Carotids 2+ bilat; no bruits. No thyromegaly or nodule noted. Cor: PMI nondisplaced. RRR, No M/G/R noted Lungs: CTAB, normal effort. Abdomen: Soft, non-tender, non-distended, no HSM. No bruits or masses. +BS  Extremities: No cyanosis, clubbing, or  rash. Trace to 1+ edema BLE. Dressing LLE.  Neuro: Alert & orientedx3, cranial nerves grossly intact. moves all 4 extremities w/o difficulty. Affect pleasant   ASSESSMENT & PLAN: 1. Acute on chronic combined CHF,presumed non-ischemic (no cath) - Echo 8/2019EF 20-25% - R/LHC normal cors. RA 17 PCWP 21 - NYHA II-III symptoms chronically. - Volume status stable on exam.   Continue torsemide 40 mg daily with extra 20 mg torsemide for weight 343 pounds. .  - Continue hydralazine 100 mg three TID.   - Continue imdur 30 mg daily - No ARB/ARNi/spirocurrently with CKD 3. Hold off for now.   2. CKD III - Baseline Cr 1.7 - 1.9. Recent labs stable.   3. PAF - New diagnosis 08/2017, which was being treated with rate control.   -S/P DC-CV on 10/2 and 03/01/2018 - Regular on exam.  - Continue amio 200 mg daily for now.  - Continue  Toprol 75 mg daily.  - Continue Xarelto 20 mg daily  4.H/o CVA - No remainingdeficit. - On Xarelto chronically.  5. HTN - Stable on current meds.   6.AAA -4.7 cm by echo8/2019 - Will need further imaging as creatinine permits.   7. OSA - Has daytime fatigue and snores. Suspect he has sleep apnea.  - Refer to Dr Radford Pax for sleep study. He has visit in February.   8. DM2 - Stable CBGs. Continue SSI - Hgb A1C 7.1 01/24/18  9. Morbid obesity - Body mass index is 50.78 kg/m. - Needs significant weight loss.  - Nutrition consult ordered   10. LLE wound - Will have [facility] come out to assess and manage. Should follow per PCP if does not improve.   Shirley Friar, PA-C  11:32 AM   Greater than 50% of the 25 minute visit was spent in counseling/coordination of care regarding disease state education, salt/fluid restriction, sliding scale diuretics, and medication compliance.

## 2018-05-16 ENCOUNTER — Encounter: Payer: Self-pay | Admitting: Gastroenterology

## 2018-06-13 ENCOUNTER — Ambulatory Visit: Payer: BC Managed Care – PPO | Admitting: Gastroenterology

## 2018-06-15 ENCOUNTER — Other Ambulatory Visit (INDEPENDENT_AMBULATORY_CARE_PROVIDER_SITE_OTHER): Payer: BC Managed Care – PPO

## 2018-06-15 ENCOUNTER — Ambulatory Visit: Payer: BC Managed Care – PPO | Admitting: Physician Assistant

## 2018-06-15 ENCOUNTER — Encounter: Payer: Self-pay | Admitting: Physician Assistant

## 2018-06-15 VITALS — BP 160/90 | HR 74 | Ht 68.0 in | Wt 357.4 lb

## 2018-06-15 DIAGNOSIS — N184 Chronic kidney disease, stage 4 (severe): Secondary | ICD-10-CM

## 2018-06-15 DIAGNOSIS — D649 Anemia, unspecified: Secondary | ICD-10-CM

## 2018-06-15 DIAGNOSIS — Z860101 Personal history of adenomatous and serrated colon polyps: Secondary | ICD-10-CM

## 2018-06-15 DIAGNOSIS — Z86010 Personal history of colonic polyps: Secondary | ICD-10-CM

## 2018-06-15 DIAGNOSIS — R195 Other fecal abnormalities: Secondary | ICD-10-CM | POA: Diagnosis not present

## 2018-06-15 DIAGNOSIS — I5042 Chronic combined systolic (congestive) and diastolic (congestive) heart failure: Secondary | ICD-10-CM

## 2018-06-15 DIAGNOSIS — Z800 Family history of malignant neoplasm of digestive organs: Secondary | ICD-10-CM

## 2018-06-15 LAB — IBC PANEL
Iron: 39 ug/dL — ABNORMAL LOW (ref 42–165)
Saturation Ratios: 12.5 % — ABNORMAL LOW (ref 20.0–50.0)
Transferrin: 222 mg/dL (ref 212.0–360.0)

## 2018-06-15 LAB — CBC WITH DIFFERENTIAL/PLATELET
Basophils Absolute: 0.1 10*3/uL (ref 0.0–0.1)
Basophils Relative: 1.3 % (ref 0.0–3.0)
Eosinophils Absolute: 0.1 10*3/uL (ref 0.0–0.7)
Eosinophils Relative: 1.5 % (ref 0.0–5.0)
HCT: 37.6 % — ABNORMAL LOW (ref 39.0–52.0)
Hemoglobin: 12.6 g/dL — ABNORMAL LOW (ref 13.0–17.0)
Lymphocytes Relative: 17.7 % (ref 12.0–46.0)
Lymphs Abs: 1.4 10*3/uL (ref 0.7–4.0)
MCHC: 33.3 g/dL (ref 30.0–36.0)
MCV: 93.9 fl (ref 78.0–100.0)
MONOS PCT: 9.7 % (ref 3.0–12.0)
Monocytes Absolute: 0.8 10*3/uL (ref 0.1–1.0)
Neutro Abs: 5.6 10*3/uL (ref 1.4–7.7)
Neutrophils Relative %: 69.8 % (ref 43.0–77.0)
Platelets: 216 10*3/uL (ref 150.0–400.0)
RBC: 4.01 Mil/uL — ABNORMAL LOW (ref 4.22–5.81)
RDW: 15 % (ref 11.5–15.5)
WBC: 8 10*3/uL (ref 4.0–10.5)

## 2018-06-15 LAB — COMPREHENSIVE METABOLIC PANEL
ALT: 15 U/L (ref 0–53)
AST: 14 U/L (ref 0–37)
Albumin: 3.6 g/dL (ref 3.5–5.2)
Alkaline Phosphatase: 91 U/L (ref 39–117)
BUN: 23 mg/dL (ref 6–23)
CO2: 37 mEq/L — ABNORMAL HIGH (ref 19–32)
Calcium: 9.2 mg/dL (ref 8.4–10.5)
Chloride: 100 mEq/L (ref 96–112)
Creatinine, Ser: 1.66 mg/dL — ABNORMAL HIGH (ref 0.40–1.50)
GFR: 52.42 mL/min — AB (ref >60.00)
GLUCOSE: 109 mg/dL — AB (ref 70–99)
Potassium: 3.8 mEq/L (ref 3.5–5.1)
Sodium: 143 mEq/L (ref 135–145)
Total Bilirubin: 0.8 mg/dL (ref 0.2–1.2)
Total Protein: 6.6 g/dL (ref 6.0–8.3)

## 2018-06-15 LAB — FERRITIN: Ferritin: 79.7 ng/mL (ref 22.0–322.0)

## 2018-06-15 LAB — VITAMIN B12: VITAMIN B 12: 143 pg/mL — AB (ref 211–911)

## 2018-06-15 NOTE — Progress Notes (Addendum)
Chief Complaint: Anemia, Hemoccult positive stool  HPI:    [name] is a 53 year old male with a complicated past medical history as listed below including CVA, CKD stage III, morbid obesity and persistent A. fib on Xarelto (cardiac cath 02/20/2018 with severe cardiomyopathy with an EF 20-25%), as well as others listed below, who was referred to me by Jay Lima, MD for a complaint of anemia and Hemoccult positive stool.      Colonoscopy 09/27/2005 done for a family history of colorectal cancer in a parent at the age of 2, with Dr. Carlean Purl with 4 polyps removed with a mixture of snare cautery and cold snare and otherwise normal.  Pathology showed adenomatous polyps and repeat was recommended in 3 years.    04/04/2018 CBC shows a hemoglobin normal at 13.1 (previously 11.3 on 03/26/2018), otherwise normal.  Vitamin B12 was found to be low at 208 and he was started on B12 injections.  Iron studies at that time showed a normal iron level and normal percent saturation and transferrin low at 190.  Ferritin normal.  Folate normal.    Today, patient tells me that he is feeling fairly well for himself.  Denies any GI complaints.  He has had no change in bowel habits, noticed no blood in his stool, no palpitations, no shortness of breath, no heartburn or reflux.    Denies weight loss, anorexia, nausea, vomiting or symptoms that awaken him from sleep.  Past Medical History:  Diagnosis Date  . CKD (chronic kidney disease), stage III ([place])   . Congestive heart failure [facility]) May of 2011   Felt to have cor pulmonale; EF 45 to 50% from echo in May 2011  . Cor pulmonale (chronic) ([place])   . Gallstones   . Gout    "take daily RX" (02/15/2018)  . History of kidney stones   . Hyperlipidemia   . Hypertension   . Morbid obesity ([place])   . Persistent atrial fibrillation    [name] 12/28/2017  . Sleep apnea    "dx'd; couldn't tolerate CPAP" (02/15/2018)  . Thoracic aneurysm    a. 4.8cm thoracic aortic aneurysm  by CT 2013.  [name] Thoracic aortic aneurysm ([place])    known/notes 12/28/2017  . Type II diabetes mellitus ([place])     Past Surgical History:  Procedure Laterality Date  . CARDIOVERSION N/A 02/22/2018   Procedure: CARDIOVERSION;  Surgeon: Jolaine Artist, MD;  Location: [facility] ENDOSCOPY;  Service: Cardiovascular;  Laterality: N/A;  . CARDIOVERSION N/A 03/01/2018   Procedure: CARDIOVERSION;  Surgeon: Jolaine Artist, MD;  Location: [place];  Service: Cardiovascular;  Laterality: N/A;  . LAPAROSCOPIC CHOLECYSTECTOMY  2000  . RIGHT/LEFT HEART CATH AND CORONARY ANGIOGRAPHY N/A 02/20/2018   Procedure: RIGHT/LEFT HEART CATH AND CORONARY ANGIOGRAPHY;  Surgeon: Jolaine Artist, MD;  Location[facility];  Service: Cardiovascular;  Laterality: N/A;  . US ECHOCARDIOGRAPHY  09/21/2009   EF 45-50%; Cavity size was severely dilated, severe concentric hypertrophy and normal wall motion    Current Outpatient Medications  Medication Sig Dispense Refill  . acetaminophen (TYLENOL) 325 MG tablet Take 1-2 tablets (325-650 mg total) by mouth every 6 (six) hours as needed for mild pain.    [name] allopurinol (ZYLOPRIM) 100 MG tablet Take 1 tablet (100 mg total) by mouth daily. 90 tablet 1  . amiodarone (PACERONE) 200 MG tablet Take 1 tablet (200 mg total) by mouth daily. 30 tablet 3  . aspirin 81 MG EC tablet Take 1  tablet (81 mg total) by mouth daily.    [name] atorvastatin (LIPITOR) 80 MG tablet Take 1 tablet (80 mg total) by mouth daily. 30 tablet 1  . hydrALAZINE (APRESOLINE) 100 MG tablet Take 1 tablet (100 mg total) by mouth 3 (three) times daily. 90 tablet 3  . isosorbide mononitrate (IMDUR) 30 MG 24 hr tablet Take 1 tablet (30 mg total) by mouth daily. 30 tablet 4  . metoprolol succinate (TOPROL-XL) 25 MG 24 hr tablet Take 3 tablets (75 mg total) by mouth daily. 90 tablet 0  . omega-3 acid ethyl esters (LOVAZA) 1 g capsule Take 2 capsules (2 g total) by mouth 2 (two) times daily. 120 capsule 11  .  potassium chloride SA (KLOR-CON M20) 20 MEQ tablet Take 3 tablets (60 mEq total) by mouth daily. (Patient taking differently: Take 2 mEq by mouth 2 (two) times daily. ) 90 tablet 3  . rivaroxaban (XARELTO) 20 MG TABS tablet Take 1 tablet (20 mg total) by mouth daily with supper. 90 tablet 1  . torsemide (DEMADEX) 20 MG tablet Take 2 tablets (40 mg total) by mouth every morning. May also take 1 tablet (20 mg total) as needed (for weight 343 or greater). 90 tablet 3   No current facility-administered medications for this visit.     Allergies as of 06/15/2018  . (No Known Allergies)    Family History  Problem Relation Age of Onset  . Hypertension Mother   . Pancreatic cancer Father   . Prostate cancer Father   . Colon cancer Father     Social History   Socioeconomic History  . Marital status: Married    Spouse name: Not on file  . Number of children: 2  . Years of education: Not on file  . Highest education level: Bachelor's degree (e.g., BA, AB, BS)  Occupational History  . Occupation: unemployed  Social Needs  . Financial resource strain: Not hard at all  . Food insecurity:    Worry: Never true    Inability: Never true  . Transportation needs:    Medical: No    Non-medical: No  Tobacco Use  . Smoking status: Never Smoker  . Smokeless tobacco: Never Used  Substance and Sexual Activity  . Alcohol use: Not Currently    Comment: 02/15/2018 "nothing in the last couple years"  . Drug use: Never  . Sexual activity: Yes  Lifestyle  . Physical activity:    Days per week: Not on file    Minutes per session: Not on file  . Stress: Not on file  Relationships  . Social connections:    Talks on phone: Not on file    Gets together: Not on file    Attends religious service: Not on file    Active member of club or organization: Not on file    Attends meetings of clubs or organizations: Not on file    Relationship status: Not on file  . Intimate partner violence:    Fear of  current or ex partner: Not on file    Emotionally abused: Not on file    Physically abused: Not on file    Forced sexual activity: Not on file  Other Topics Concern  . Not on file  Social History Narrative  . Not on file    Review of Systems:    Constitutional: No weight loss, fever or chills Skin: No rash  Cardiovascular: No chest pain Respiratory: No SOB  Gastrointestinal: See HPI and otherwise negative  Genitourinary: No dysuria  Neurological: No headache, dizziness or syncope Musculoskeletal: No new muscle or joint pain Hematologic: No bleeding  Psychiatric: No history of depression or anxiety   Physical Exam:  Vital signs: BP (!) 160/90   Pulse 74   Ht 5\' 8"  (1.727 m)   Wt (!) 357 lb 6.4 oz (162.1 kg)   BMI 54.34 kg/m   Constitutional:   Pleasant Obese Caucasian male appears to be in NAD, Well developed, Well nourished, alert and cooperative Head:  Normocephalic and atraumatic. Eyes:   PEERL, EOMI. No icterus. Conjunctiva pink. Ears:  Normal auditory acuity. Neck:  Supple Throat: Oral cavity and pharynx without inflammation, swelling or lesion.  Respiratory: Respirations even and unlabored. Lungs clear to auscultation bilaterally.   No wheezes, crackles, or rhonchi.  Cardiovascular: Normal S1, S2. No MRG. Regular rate and rhythm. No peripheral edema, cyanosis or pallor.  Gastrointestinal:  Soft, nondistended, nontender. No rebound or guarding. Normal bowel sounds. No appreciable masses or hepatomegaly. Rectal:  Not performed.  Msk:  Symmetrical without gross deformities. Without edema, no deformity or joint abnormality. +ambulates with a walker Neurologic:  Alert and  oriented x4;  grossly normal neurologically.  Skin:   Dry and intact without significant lesions or rashes. Psychiatric: Demonstrates good judgement and reason without abnormal affect or behaviors.  MOST RECENT LABS AND IMAGING: CBC    Component Value Date/Time   WBC 7.5 04/04/2018 1403   RBC 4.20  (L) 04/04/2018 1403   HGB 13.1 04/04/2018 1403   HCT 39.5 04/04/2018 1403   PLT 321.0 04/04/2018 1403   MCV 94.2 04/04/2018 1403   MCH 29.8 03/26/2018 1009   MCHC 33.2 04/04/2018 1403   RDW 15.9 (H) 04/04/2018 1403   LYMPHSABS 1.5 04/04/2018 1403   MONOABS 0.9 04/04/2018 1403   EOSABS 0.2 04/04/2018 1403   BASOSABS 0.1 04/04/2018 1403    CMP     Component Value Date/Time   NA 143 04/04/2018 1403   NA 143 01/25/2018 1423   K 3.5 04/04/2018 1403   CL 101 04/04/2018 1403   CO2 33 (H) 04/04/2018 1403   GLUCOSE 103 (H) 04/04/2018 1403   BUN 36 (H) 04/04/2018 1403   BUN 32 (H) 01/25/2018 1423   CREATININE 1.59 (H) 04/04/2018 1403   CALCIUM 8.6 04/04/2018 1403   PROT 6.7 03/24/2018 1812   ALBUMIN 2.8 (L) 03/24/2018 1812   AST 19 03/24/2018 1812   ALT 24 03/24/2018 1812   ALKPHOS 82 03/24/2018 1812   BILITOT 1.6 (H) 03/24/2018 1812   GFRNONAA 40 (L) 03/27/2018 0341   GFRAA 47 (L) 03/27/2018 0341    Assessment: 1.  Anemia: Discovered during hospitalization in November, most recent hemoglobin 04/04/2018 was actually normal with a decreased B12, patient never followed up for his B12 injections and has had no further labs, Hemoccult positive at that time in clinic but on chronic anticoagulation 2.  Chronic anticoagulation: For A. fib on Xarelto 3.  Morbid obesity 4.  CKD stage III 5.  Decreased ejection fraction: 20-25% on last cardiac cath 02/20/2018 6.  History of stroke 7.  Family history of colon cancer: In his dad around his 61s 8.  History of tubular adenomas: Last colonoscopy in 2007  Plan: 1.  This patient has multiple co-morbidities including ambulating with a walker amongst others above and for that reason would be high risk for any endoscopic procedure, instead of pursuing this today, we will order repeat labs as they have not been done since November.  2.  Ordered CBC, CMP, Hemoccult x3, iron studies and B12 with folate 3.  Will discuss case with Dr. Carlean Purl.  If  patient does truly need further evaluation of anemia then will need to discuss possible colonoscopy in the hospital versus virtual colonoscopy. 4.  Patient to await further recommendations after labs above.  Ellouise Newer, PA-C [facility] 06/15/2018, 9:53 AM  Cc: Jay Lima, MD  Agree with Ms. Mort Sawyers evaluation and management. Have reviewed and recommend he have EGD and colonoscopy which has been scheduled Gatha Mayer, MD, [facility]

## 2018-06-15 NOTE — Patient Instructions (Signed)
Your provider has requested that you go to the basement level for lab work before leaving today. Press "B" on the elevator. The lab is located at the first door on the left as you exit the elevator.  Follow the instructions on the Hemoccult cards and mail them back to [place] when you are finished or you may take them directly to the lab in the basement of the [place] building. We will call you with the results.   Please follow up with Dr Carlean Purl or Ellouise Newer, PA-C as recommended after labwork is completed.  If you are age 47 or older, your body mass index should be between 23-30. Your Body mass index is 54.34 kg/m. If this is out of the aforementioned range listed, please consider follow up with your Primary Care Provider.  If you are age 28 or younger, your body mass index should be between 19-25. Your Body mass index is 54.34 kg/m. If this is out of the aformentioned range listed, please consider follow up with your Primary Care Provider.

## 2018-06-22 NOTE — Progress Notes (Signed)
Given heme + stool and anemia EGD and colonoscopy at hospital off Xarelto are appropriate so please arrange that.

## 2018-06-26 ENCOUNTER — Telehealth: Payer: Self-pay

## 2018-06-26 ENCOUNTER — Other Ambulatory Visit: Payer: Self-pay

## 2018-06-26 DIAGNOSIS — D649 Anemia, unspecified: Secondary | ICD-10-CM

## 2018-06-26 DIAGNOSIS — R195 Other fecal abnormalities: Secondary | ICD-10-CM

## 2018-06-26 NOTE — Progress Notes (Signed)
08/08/18 Colon EGD [facility] Dr Carlean Purl at 9:30 am.  Xarelto letter sent to Dr Aundra Dubin

## 2018-06-26 NOTE — Telephone Encounter (Signed)
[facility] Pre-operative Risk Assessment     Request for surgical clearance:     Endoscopy Procedure  What type of surgery is being performed?     Endo colon  When is this surgery scheduled?     08/08/18  What type of clearance is required ?   Pharmacy  Are there any medications that need to be held prior to surgery and how long? Xarelto  Practice name and name of physician performing surgery?      [facility]  What is your office phone and fax number?      Phone- [phone]  [phone]  Anesthesia type (None, local, MAC, general) ?       MAC

## 2018-06-27 NOTE — Telephone Encounter (Signed)
Pt takes Xarelto for afib with CHADS2VASc score of 5 (CHF, HTN, DM, CVA). SCr 1.66 however pt is morbidly obese, so CrCl using adjusted body weight remains 21mL/min (125mL/min using actual body weight, 55mL/min using ideal body weight). Recommend holding Xarelto for 1 day prior to procedure due to elevated cardiac risk including history of CVA.

## 2018-06-27 NOTE — Telephone Encounter (Signed)
Left message on machine to call back  

## 2018-06-29 NOTE — Telephone Encounter (Signed)
   Primary Cardiologist:Jonathan Gwenlyn Found, MD  Chart reviewed as part of pre-operative protocol coverage. Because of Jay Chen's past medical history and time since last visit, he/she will require a follow-up visit in order to better assess preoperative cardiovascular risk. Pt is a heart failure pt and will see Advanced Heart failure clinic on 07/20/17.    If applicable, this message will also be routed to pharmacy pool and/or primary cardiologist for input on holding anticoagulant/antiplatelet agent as requested below so that this information is available at time of patient's appointment.   Cecilie Kicks, NP  06/29/2018, 2:01 PM

## 2018-07-17 ENCOUNTER — Other Ambulatory Visit: Payer: Self-pay

## 2018-07-17 MED ORDER — METOPROLOL SUCCINATE ER 25 MG PO TB24: 75.0000 mg | ORAL_TABLET | Freq: Every day | ORAL | 6 refills | Status: DC

## 2018-07-18 ENCOUNTER — Ambulatory Visit: Payer: BC Managed Care – PPO | Admitting: Cardiology

## 2018-07-19 ENCOUNTER — Telehealth: Payer: Self-pay | Admitting: *Deleted

## 2018-07-19 DIAGNOSIS — G4733 Obstructive sleep apnea (adult) (pediatric): Secondary | ICD-10-CM

## 2018-07-19 NOTE — Telephone Encounter (Signed)
-----   Message from Sarina Ill, RN sent at 07/18/2018 10:59 AM EST ----- Regarding: Sleep Hello, The patient has orders for a split night, but it has not been schedule. Please follow up and see what is the hold up. Thanks, [employer]

## 2018-07-20 ENCOUNTER — Ambulatory Visit (HOSPITAL_COMMUNITY)
Admission: RE | Admit: 2018-07-20 | Discharge: 2018-07-20 | Disposition: A | Discharge status: Home / Self Care | Payer: BC Managed Care – PPO | Source: Ambulatory Visit | Attending: Cardiology | Admitting: Cardiology

## 2018-07-20 ENCOUNTER — Telehealth: Payer: Self-pay | Admitting: *Deleted

## 2018-07-20 ENCOUNTER — Other Ambulatory Visit: Payer: Self-pay

## 2018-07-20 ENCOUNTER — Other Ambulatory Visit: Payer: Self-pay | Admitting: Adult Health

## 2018-07-20 ENCOUNTER — Encounter (HOSPITAL_COMMUNITY): Payer: Self-pay | Admitting: Cardiology

## 2018-07-20 VITALS — BP 160/86 | HR 74 | Wt 357.2 lb

## 2018-07-20 DIAGNOSIS — Z8249 Family history of ischemic heart disease and other diseases of the circulatory system: Secondary | ICD-10-CM | POA: Diagnosis not present

## 2018-07-20 DIAGNOSIS — Z87891 Personal history of nicotine dependence: Secondary | ICD-10-CM | POA: Insufficient documentation

## 2018-07-20 DIAGNOSIS — N183 Chronic kidney disease, stage 3 unspecified: Secondary | ICD-10-CM

## 2018-07-20 DIAGNOSIS — G4733 Obstructive sleep apnea (adult) (pediatric): Secondary | ICD-10-CM | POA: Diagnosis not present

## 2018-07-20 DIAGNOSIS — I130 Hypertensive heart and chronic kidney disease with heart failure and stage 1 through stage 4 chronic kidney disease, or unspecified chronic kidney disease: Secondary | ICD-10-CM | POA: Insufficient documentation

## 2018-07-20 DIAGNOSIS — I714 Abdominal aortic aneurysm, without rupture: Secondary | ICD-10-CM | POA: Diagnosis not present

## 2018-07-20 DIAGNOSIS — I480 Paroxysmal atrial fibrillation: Secondary | ICD-10-CM

## 2018-07-20 DIAGNOSIS — Z7901 Long term (current) use of anticoagulants: Secondary | ICD-10-CM | POA: Insufficient documentation

## 2018-07-20 DIAGNOSIS — Z87442 Personal history of urinary calculi: Secondary | ICD-10-CM | POA: Diagnosis not present

## 2018-07-20 DIAGNOSIS — I5042 Chronic combined systolic (congestive) and diastolic (congestive) heart failure: Secondary | ICD-10-CM

## 2018-07-20 DIAGNOSIS — I10 Essential (primary) hypertension: Secondary | ICD-10-CM

## 2018-07-20 DIAGNOSIS — M109 Gout, unspecified: Secondary | ICD-10-CM | POA: Diagnosis not present

## 2018-07-20 DIAGNOSIS — E1122 Type 2 diabetes mellitus with diabetic chronic kidney disease: Secondary | ICD-10-CM | POA: Insufficient documentation

## 2018-07-20 DIAGNOSIS — I712 Thoracic aortic aneurysm, without rupture: Secondary | ICD-10-CM | POA: Insufficient documentation

## 2018-07-20 DIAGNOSIS — I509 Heart failure, unspecified: Secondary | ICD-10-CM | POA: Diagnosis not present

## 2018-07-20 DIAGNOSIS — E785 Hyperlipidemia, unspecified: Secondary | ICD-10-CM | POA: Insufficient documentation

## 2018-07-20 DIAGNOSIS — I428 Other cardiomyopathies: Secondary | ICD-10-CM | POA: Diagnosis not present

## 2018-07-20 DIAGNOSIS — Z8673 Personal history of transient ischemic attack (TIA), and cerebral infarction without residual deficits: Secondary | ICD-10-CM | POA: Diagnosis not present

## 2018-07-20 DIAGNOSIS — Z7982 Long term (current) use of aspirin: Secondary | ICD-10-CM | POA: Diagnosis not present

## 2018-07-20 DIAGNOSIS — Z6841 Body Mass Index (BMI) 40.0 and over, adult: Secondary | ICD-10-CM | POA: Diagnosis not present

## 2018-07-20 DIAGNOSIS — Z79899 Other long term (current) drug therapy: Secondary | ICD-10-CM | POA: Insufficient documentation

## 2018-07-20 MED ORDER — LOSARTAN POTASSIUM 25 MG PO TABS: 25.0000 mg | ORAL_TABLET | Freq: Every day | ORAL | 3 refills | Status: DC

## 2018-07-20 NOTE — Patient Instructions (Signed)
BEGIN taking Losartan 25 mg daily.  Please follow up lab work in 2 weeks.  Take an extra 40 mg of Torsemide this afternoon ONLY, then take it as you do regularly.   Please look into the [employer] :)

## 2018-07-20 NOTE — Progress Notes (Signed)
PCP: Dr [name]  Primary HF Cardiologist: Dr Vaughan Browner  Cardiology: Dr Gwenlyn Found   HPI: Jay Chen is a 53 y.o. 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 Diuretics held on the day of discharge due to increased creatinine. He underwent RHC/LHC with normal cors and moderate mixed pulmonary venous/arterial HTN.  HF medications optimized. He was not placed on spiro or arb due to elevated creatinine. Once diuresed he underwent successful DC-CV. He was discharged to inpatient rehab. He was discharged from rehab on 03/01/18. On the day of discharge he was sent for successful cardioversion.    Admitted 11/1 - 03/28/18 with BLE pain. Work up unremarkable apart from chronic Arthritis/gout. Treated with allopurinol/prednisone. Continued on home torsemide.  He presents today for regular follow up. Weight up 23 lbs since last visit. Not very active. Tries to go to a shopping center 4-5 days a week and walks around. Denies SOB when he does this. No lightheadedness or dizziness. No SOB with ADLs. Mild orthopnea on occasional. Sleep son 2 pillows chronically. No longer with HHRN/PT. Takes torsemide 40 mg daily. Hasn't taken any extra. Reports taking all other medications as directed.   RHC/LHC 02/20/2018 Ao = 125/87 (104) LV = 122/23  RA = 17  RV = 56/16 PA = 52/35 (42) PCW = 21 Fick cardiac output/index = 7.0.2.7 SVR = 993 PVR = 3.0 WU Ao sat = 92% PA sat = 66%, 65%  Assessment: 1. Normal coronaries 2. 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 Heart appears rotated 90% leftward on cath images 4. Moderate mixed pulmonary venous/arterial HTN  ECHO 12/2017 Left ventricle: The cavity size was moderately to  severely   dilated. Wall thickness was increased in a pattern of moderate   LVH. Systolic function was severely reduced. The estimated   ejection fraction was in the range of 20% to 25%. Diffuse   hypokinesis. The study is not technically sufficient to allow   evaluation of LV diastolic function. - Aortic valve: Mildly calcified annulus. Valve area (VTI): 4.07   cm^2. Valve area (Vmax): 4.14 cm^2. Valve area (Vmean): 4.16   cm^2. - Aorta: There is a single still frame view of a portion of the   proximal ascending that is dilated measuring 4.7 cm, consistent   with moderate to large aneurysm. From chart review a similar   aneurysm was detected by CT scan 03/28/2012. Limited evaluation by   echo, consider alternative modality if more complete evaluation   is indicated. - Left atrium: The atrium was severely dilated. - Right ventricle: The cavity size was mildly dilated. Systolic   function was mildly reduced. - Right atrium: The atrium was mildly to moderately dilated. - Atrial septum: No defect or patent foramen ovale was identified. - Inferior vena cava: The vessel was dilated. The respirophasic   diameter changes were blunted (< 50%), consistent with elevated   central venous pressure. - Technically difficult study. Echocontrast was used to enhance   visualization.  Review of systems complete and found to be negative unless listed in HPI.    SH:  Social History   Socioeconomic History  . Marital status: Married    Spouse name: Not on file  .  Number of children: 2  . Years of education: Not on file  . Highest education level: Bachelor's degree (e.g., BA, AB, BS)  Occupational History  . Occupation: unemployed  Social Needs  . Financial resource strain: Not hard at all  . Food insecurity:    Worry: Never true    Inability: Never true  . Transportation needs:    Medical: No    Non-medical: No  Tobacco Use  . Smoking status: Former Research scientist (life sciences)  . Smokeless tobacco: Never Used   . Tobacco comment: did in college  Substance and Sexual Activity  . Alcohol use: Not Currently    Comment: 02/15/2018 "nothing in the last couple years"  . Drug use: Never  . Sexual activity: Yes  Lifestyle  . Physical activity:    Days per week: Not on file    Minutes per session: Not on file  . Stress: Not on file  Relationships  . Social connections:    Talks on phone: Not on file    Gets together: Not on file    Attends religious service: Not on file    Active member of club or organization: Not on file    Attends meetings of clubs or organizations: Not on file    Relationship status: Not on file  . Intimate partner violence:    Fear of current or ex partner: Not on file    Emotionally abused: Not on file    Physically abused: Not on file    Forced sexual activity: Not on file  Other Topics Concern  . Not on file  Social History Narrative  . Not on file   FH:  Family History  Problem Relation Age of Onset  . Hypertension Mother   . Pancreatic cancer Father   . Prostate cancer Father   . Colon cancer Father    Past Medical History:  Diagnosis Date  . CKD (chronic kidney disease), stage III ([place])   . Congestive heart failure [facility]) May of 2011   Felt to have cor pulmonale; EF 45 to 50% from echo in May 2011  . Cor pulmonale (chronic) ([place])   . Gallstones   . Gout    "take daily RX" (02/15/2018)  . History of kidney stones   . Hyperlipidemia   . Hypertension   . Morbid obesity ([place])   . Persistent atrial fibrillation    [name] 12/28/2017  . Sleep apnea    "dx'd; couldn't tolerate CPAP" (02/15/2018)  . Thoracic aneurysm    a. 4.8cm thoracic aortic aneurysm by CT 2013.  [name] Thoracic aortic aneurysm ([place])    known/notes 12/28/2017  . Type II diabetes mellitus ([place])     Current Outpatient Medications  Medication Sig Dispense Refill  . acetaminophen (TYLENOL) 325 MG tablet Take 1-2 tablets (325-650 mg total) by mouth every 6 (six) hours as needed for mild pain.      [name] allopurinol (ZYLOPRIM) 100 MG tablet Take 1 tablet (100 mg total) by mouth daily. 90 tablet 1  . amiodarone (PACERONE) 200 MG tablet Take 1 tablet (200 mg total) by mouth daily. 30 tablet 3  . aspirin 81 MG EC tablet Take 1 tablet (81 mg total) by mouth daily.    [name] atorvastatin (LIPITOR) 80 MG tablet Take 1 tablet (80 mg total) by mouth daily. 30 tablet 1  . hydrALAZINE (APRESOLINE) 100 MG tablet Take 1 tablet (100 mg total) by mouth 3 (three) times daily. 90 tablet 3  . isosorbide mononitrate (IMDUR) 30 MG  24 hr tablet Take 1 tablet (30 mg total) by mouth daily. 30 tablet 4  . metoprolol succinate (TOPROL-XL) 25 MG 24 hr tablet Take 3 tablets (75 mg total) by mouth daily. 90 tablet 6  . omega-3 acid ethyl esters (LOVAZA) 1 g capsule Take 2 capsules (2 g total) by mouth 2 (two) times daily. 120 capsule 11  . potassium chloride SA (KLOR-CON M20) 20 MEQ tablet Take 3 tablets (60 mEq total) by mouth daily. 90 tablet 3  . rivaroxaban (XARELTO) 20 MG TABS tablet Take 1 tablet (20 mg total) by mouth daily with supper. 90 tablet 1  . torsemide (DEMADEX) 20 MG tablet Take 2 tablets (40 mg total) by mouth every morning. May also take 1 tablet (20 mg total) as needed (for weight 343 or greater). 90 tablet 3   No current facility-administered medications for this encounter.    Vitals:   07/20/18 1041  BP: (!) 160/86  Pulse: 74  SpO2: 92%  Weight: (!) 162 kg (357 lb 3.2 oz)   Wt Readings from Last 3 Encounters:  07/20/18 (!) 162 kg (357 lb 3.2 oz)  06/15/18 (!) 162.1 kg (357 lb 6.4 oz)  04/10/18 (!) 151.5 kg (334 lb)   PHYSICAL EXAM: General: NAD. Ambulated into clinic with a walker.  HEENT: Normal Neck: Supple. JVP difficult. Carotids 2+ bilat; no bruits. No thyromegaly or nodule noted. Cor: PMI nondisplaced. RRR, No M/G/R noted Lungs: CTAB, normal effort. Abdomen: Soft, non-tender, non-distended, no HSM. No bruits or masses. +BS  Extremities: No cyanosis, clubbing, or rash. 1+ edema.   Neuro: Alert & orientedx3, cranial nerves grossly intact. moves all 4 extremities w/o difficulty. Affect pleasant   ASSESSMENT & PLAN: 1. 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 RA 17 PCWP 21 - NYHA III symptoms - Volume status elevated.  - Continue torsemide 40 mg daily. Take extra 40 mg this afternoon.  - Continue hydralazine 100 mg three TID.   - Continue imdur 30 mg daily - Add losartan 25 mg daily. BMET 2 weeks.  - Reinforced fluid restriction to < 2 L daily, sodium restriction to less than 2000 mg daily, and the importance of daily weights.    2. CKD III - Baseline Cr 1.7 - 1.9.  - Recent labs stable. Will recheck 2 weeks with starting Losartan.   3. PAF - New diagnosis 08/2017, which was being treated with rate control.   -S/P DC-CV on 10/2 and 03/01/2018 - Regular on exam.  - Continue amio 200 mg daily - Continue  Toprol 75 mg daily.  - Continue Xarelto 20 mg daily  4.H/o CVA - No remainingdeficit. - On Xarelto chronically.Denies bleeding.   5. HTN - Meds as above.   6.AAA -4.7 cm by echo8/2019 - Plan BMET in 2 weeks with starting losartan.   7. OSA - Has daytime fatigue and snores. Suspect he has sleep apnea.  - Being set up for sleep study with Dr. Radford Pax.   8. DM2 - Stable CBGs. Continue SSI - Hgb A1C 7.1 01/24/18  9. Morbid obesity - Body mass index is 54.31 kg/m. - Needs significant weight loss. Discussed exercise and [employer].   Meds as above. Adding losartan. BMET 2 weeks. RTC 6 weeks for continued med titration. RTC 4 months for MD visit with Echo. Sooner with symptoms.   Shirley Friar, PA-C  10:56 AM   Greater than 50% of the 25 minute visit was spent in  counseling/coordination of care regarding disease state education, salt/fluid restriction, sliding scale diuretics, and medication compliance.

## 2018-07-20 NOTE — Telephone Encounter (Signed)
-----   Message from Freada Bergeron, [place] sent at 07/19/2018  5:18 PM EST ----- Regarding: PRECERT SPLIT NIGHT

## 2018-07-20 NOTE — Telephone Encounter (Signed)
Staff message sent to Gae Bon per Rosanne Sack @ [employer] no PA is required.  Ok to schedule sleep study.

## 2018-07-21 NOTE — Telephone Encounter (Addendum)
Patient is scheduled for lab study on 09/06/18. Patient understands his sleep study will be done at [facility] sleep lab. Patient understands he will receive a sleep packet in a week or so. Patient understands to call if he does not receive the sleep packet in a timely manner.  Left detailed message on voicemail with date and time of titration and informed patient to call back to confirm or reschedule.

## 2018-07-21 NOTE — Telephone Encounter (Signed)
  Lauralee Evener, CMA  Freada Bergeron, CMA        Per Rosanne Sack @ [employer] no PA is required. Ok to schedule.     ----- Message -----  From: Freada Bergeron, CMA  Sent: 07/19/2018  5:18 PM EST  To: Cv Div Sleep Studies  Subject: PRECERT                      SPLIT NIGHT

## 2018-08-03 ENCOUNTER — Ambulatory Visit (HOSPITAL_COMMUNITY)
Admission: RE | Admit: 2018-08-03 | Discharge: 2018-08-03 | Disposition: A | Discharge status: Home / Self Care | Payer: BC Managed Care – PPO | Source: Ambulatory Visit | Attending: Internal Medicine | Admitting: Internal Medicine

## 2018-08-03 ENCOUNTER — Other Ambulatory Visit: Payer: Self-pay

## 2018-08-03 DIAGNOSIS — I509 Heart failure, unspecified: Secondary | ICD-10-CM | POA: Diagnosis not present

## 2018-08-03 LAB — BASIC METABOLIC PANEL
Anion gap: 7 (ref 5–15)
BUN: 18 mg/dL (ref 6–20)
CO2: 30 mmol/L (ref 22–32)
Calcium: 8.5 mg/dL — ABNORMAL LOW (ref 8.9–10.3)
Chloride: 103 mmol/L (ref 98–111)
Creatinine, Ser: 1.64 mg/dL — ABNORMAL HIGH (ref 0.61–1.24)
GFR calc Af Amer: 54 mL/min — ABNORMAL LOW (ref >60)
GFR calc non Af Amer: 47 mL/min — ABNORMAL LOW (ref >60)
Glucose, Bld: 102 mg/dL — ABNORMAL HIGH (ref 70–99)
Potassium: 3.6 mmol/L (ref 3.5–5.1)
Sodium: 140 mmol/L (ref 135–145)

## 2018-08-07 ENCOUNTER — Encounter (HOSPITAL_COMMUNITY): Payer: Self-pay | Admitting: Anesthesiology

## 2018-08-07 NOTE — Anesthesia Preprocedure Evaluation (Deleted)
Anesthesia Evaluation    Reviewed: Allergy & Precautions, Patient's Chart, lab work & pertinent test results  Airway        Dental   Pulmonary neg pulmonary ROS, former smoker,           Cardiovascular hypertension, negative cardio ROS       Neuro/Psych negative neurological ROS     GI/Hepatic negative GI ROS, Neg liver ROS,   Endo/Other  negative endocrine ROSdiabetes  Renal/GU negative Renal ROS     Musculoskeletal negative musculoskeletal ROS (+)   Abdominal   Peds  Hematology negative hematology ROS (+)   Anesthesia Other Findings Day of surgery medications reviewed with the patient.  Reproductive/Obstetrics                             Anesthesia Physical Anesthesia Plan Anesthesia Quick Evaluation

## 2018-08-08 ENCOUNTER — Ambulatory Visit (HOSPITAL_COMMUNITY)
Admission: RE | Admit: 2018-08-08 | Payer: BC Managed Care – PPO | Source: Home / Self Care | Admitting: Internal Medicine

## 2018-08-08 ENCOUNTER — Encounter (HOSPITAL_COMMUNITY): Admission: RE | Payer: Self-pay | Source: Home / Self Care

## 2018-08-08 SURGERY — ESOPHAGOGASTRODUODENOSCOPY (EGD) WITH PROPOFOL
Anesthesia: Monitor Anesthesia Care

## 2018-08-08 MED ORDER — PROPOFOL 10 MG/ML IV BOLUS
INTRAVENOUS | Status: AC
  Filled 2018-08-08: qty 40

## 2018-08-09 ENCOUNTER — Other Ambulatory Visit (HOSPITAL_COMMUNITY): Payer: Self-pay | Admitting: Cardiology

## 2018-08-09 DIAGNOSIS — I4819 Other persistent atrial fibrillation: Secondary | ICD-10-CM

## 2018-08-09 MED ORDER — RIVAROXABAN 20 MG PO TABS: 20.0000 mg | ORAL_TABLET | Freq: Every day | ORAL | 3 refills | Status: DC

## 2018-08-25 ENCOUNTER — Other Ambulatory Visit: Payer: Self-pay

## 2018-08-25 ENCOUNTER — Telehealth (HOSPITAL_COMMUNITY): Payer: Self-pay | Admitting: Adult Health

## 2018-08-25 MED ORDER — ISOSORBIDE MONONITRATE ER 30 MG PO TB24: 30.0000 mg | ORAL_TABLET | Freq: Every day | ORAL | 6 refills | Status: DC

## 2018-08-25 NOTE — Telephone Encounter (Signed)
Attempted to contact PT re: tele-health options for visit scheduled for 4/9.  Will continue to attempt to contact PT.

## 2018-08-28 ENCOUNTER — Other Ambulatory Visit (HOSPITAL_COMMUNITY): Payer: Self-pay | Admitting: *Deleted

## 2018-08-28 DIAGNOSIS — E876 Hypokalemia: Secondary | ICD-10-CM

## 2018-08-28 DIAGNOSIS — I10 Essential (primary) hypertension: Secondary | ICD-10-CM

## 2018-08-28 MED ORDER — AMIODARONE HCL 200 MG PO TABS: 200.0000 mg | ORAL_TABLET | Freq: Every day | ORAL | 3 refills | Status: DC

## 2018-08-28 MED ORDER — POTASSIUM CHLORIDE CRYS ER 20 MEQ PO TBCR: 60.0000 meq | EXTENDED_RELEASE_TABLET | Freq: Every day | ORAL | 3 refills | Status: DC

## 2018-08-31 ENCOUNTER — Encounter (HOSPITAL_COMMUNITY): Payer: Self-pay

## 2018-08-31 ENCOUNTER — Ambulatory Visit (HOSPITAL_COMMUNITY)
Admission: RE | Admit: 2018-08-31 | Discharge: 2018-08-31 | Disposition: A | Discharge status: Home / Self Care | Payer: BC Managed Care – PPO | Source: Ambulatory Visit | Attending: Internal Medicine | Admitting: Internal Medicine

## 2018-08-31 ENCOUNTER — Other Ambulatory Visit: Payer: Self-pay

## 2018-08-31 VITALS — Wt 350.0 lb

## 2018-08-31 DIAGNOSIS — G4733 Obstructive sleep apnea (adult) (pediatric): Secondary | ICD-10-CM | POA: Diagnosis not present

## 2018-08-31 DIAGNOSIS — I480 Paroxysmal atrial fibrillation: Secondary | ICD-10-CM | POA: Diagnosis not present

## 2018-08-31 DIAGNOSIS — I10 Essential (primary) hypertension: Secondary | ICD-10-CM

## 2018-08-31 DIAGNOSIS — I5042 Chronic combined systolic (congestive) and diastolic (congestive) heart failure: Secondary | ICD-10-CM

## 2018-08-31 DIAGNOSIS — E876 Hypokalemia: Secondary | ICD-10-CM

## 2018-08-31 DIAGNOSIS — N183 Chronic kidney disease, stage 3 unspecified: Secondary | ICD-10-CM

## 2018-08-31 MED ORDER — SPIRONOLACTONE 25 MG PO TABS: 12.5000 mg | ORAL_TABLET | Freq: Every day | ORAL | 5 refills | Status: AC

## 2018-08-31 MED ORDER — AMIODARONE HCL 200 MG PO TABS: 200.0000 mg | ORAL_TABLET | Freq: Every day | ORAL | 1 refills | Status: DC

## 2018-08-31 MED ORDER — POTASSIUM CHLORIDE CRYS ER 20 MEQ PO TBCR: 40.0000 meq | EXTENDED_RELEASE_TABLET | Freq: Every day | ORAL | 1 refills | Status: DC

## 2018-08-31 MED ORDER — LOSARTAN POTASSIUM 25 MG PO TABS: 25.0000 mg | ORAL_TABLET | Freq: Every day | ORAL | 1 refills | Status: DC

## 2018-08-31 MED ORDER — METOPROLOL SUCCINATE ER 100 MG PO TB24: 100.0000 mg | ORAL_TABLET | Freq: Every day | ORAL | 5 refills | Status: AC

## 2018-08-31 MED ORDER — METOPROLOL SUCCINATE ER 100 MG PO TB24: 100.0000 mg | ORAL_TABLET | Freq: Every day | ORAL | 1 refills | Status: DC

## 2018-08-31 MED ORDER — SPIRONOLACTONE 25 MG PO TABS: 12.5000 mg | ORAL_TABLET | Freq: Every day | ORAL | 1 refills | Status: DC

## 2018-08-31 MED ORDER — LOSARTAN POTASSIUM 25 MG PO TABS: 25.0000 mg | ORAL_TABLET | Freq: Every day | ORAL | 5 refills | Status: DC

## 2018-08-31 MED ORDER — POTASSIUM CHLORIDE CRYS ER 20 MEQ PO TBCR: 40.0000 meq | EXTENDED_RELEASE_TABLET | Freq: Every day | ORAL | 5 refills | Status: AC

## 2018-08-31 MED ORDER — AMIODARONE HCL 200 MG PO TABS: 200.0000 mg | ORAL_TABLET | Freq: Every day | ORAL | 5 refills | Status: AC

## 2018-08-31 NOTE — Progress Notes (Addendum)
Heart Failure TeleHealth Note  Due to national recommendations of social distancing due to [place] 19, Audio/video telehealth visit is felt to be most appropriate for this patient at this time.  See MyChart message from today for patient consent regarding telehealth for [facility].  Date:  08/31/2018   ID:  Jay Chen, DOB June 12, 1965, MRN [id number]  Location: Home  Provider location: [place] [facility] Type of Visit: Established patient   PCP:  [name], MD  Cardiologist:  Quay Burow, MD Primary HF: Dr [name]  Chief Complaint: HF follow up, systolic HF   History of Present Illness: Jay Chen is a 53 y.o. 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  Admitted from [facility] clinic 02/15/18 with acute on chronic combined CHF and marked volume overload.Diuresed with IV lasix 40 pounds. Diuretics held on the day of discharge due to increased creatinine.He underwent RHC/LHC with normal cors and moderate mixed pulmonary venous/arterial HTN. HF medications optimized. He was not placed on spiro or arb due to elevated creatinine. Once diuresed he underwent successful DC-CV. He was discharged to inpatient rehab. He was discharged from rehab on 03/01/18. On the day of discharge he was sent for successful cardioversion.    Admitted 11/1 - 03/28/18 with BLE pain. Work up unremarkable apart from chronic Arthritis/gout. Treated with allopurinol/prednisone. Continued on home torsemide.  Last seen in HF clinic 07/20/18. Weight was up 23 lbs and he was told to take an additional torsemide. Losartan was also added. Weight was 357 lbs that day. Repeat labs after med change with stable creatinine 1.64, K 3.6.   He presents via Engineer, civil (consulting) for a telehealth visit today. SOB after walking a far distance. Has occasional edema in BLE edema,  but improves with elevation. No orthopnea or PND. No palpitations or dizziness. Appetite and energy level fine. No cough, fever, or chills. No sick contacts. Only leaves the house for grocery store. Taking all medications, but is out of losartan (needs refill). Limits fluid and salt intake. Eating a lot of baked/boiled chicken and salad. No fried foods.  Weight: 350 lbs BP: 130-140/80-90s HR: 80-90s  He denies symptoms worrisome for COVID 19.   Past Medical History:  Diagnosis Date  . CKD (chronic kidney disease), stage III ([place])   . Congestive heart failure [facility]) May of 2011   Felt to have cor pulmonale; EF 45 to 50% from echo in May 2011  . Cor pulmonale (chronic) ([place])   . Gallstones   . Gout    "take daily RX" (02/15/2018)  . History of kidney stones   . Hyperlipidemia   . Hypertension   . Morbid obesity ([place])   . Persistent atrial fibrillation    [name] 12/28/2017  . Sleep apnea    "dx'd; couldn't tolerate CPAP" (02/15/2018)  . Thoracic aneurysm    a. 4.8cm thoracic aortic aneurysm by CT 2013.  [name] Thoracic aortic aneurysm ([place])    known/notes 12/28/2017  . Type II diabetes mellitus ([place])    Past Surgical History:  Procedure Laterality Date  . CARDIOVERSION N/A 02/22/2018   Procedure: CARDIOVERSION;  Surgeon: Jolaine Artist, MD;  Location: [facility] ENDOSCOPY;  Service: Cardiovascular;  Laterality: N/A;  . CARDIOVERSION N/A 03/01/2018   Procedure: CARDIOVERSION;  Surgeon: Jolaine Artist, MD;  Location: [place];  Service: Cardiovascular;  Laterality: N/A;  . LAPAROSCOPIC CHOLECYSTECTOMY  2000  . RIGHT/LEFT HEART CATH AND CORONARY ANGIOGRAPHY N/A 02/20/2018   Procedure: RIGHT/LEFT HEART CATH AND CORONARY ANGIOGRAPHY;  Surgeon: Jolaine Artist, MD;  Location: [place] CV LAB;  Service: Cardiovascular;  Laterality: N/A;  . US ECHOCARDIOGRAPHY  09/21/2009   EF 45-50%; Cavity size was severely dilated, severe concentric hypertrophy and normal wall motion     Current  Outpatient Medications  Medication Sig Dispense Refill  . allopurinol (ZYLOPRIM) 100 MG tablet Take 1 tablet (100 mg total) by mouth daily. 90 tablet 1  . amiodarone (PACERONE) 200 MG tablet Take 1 tablet (200 mg total) by mouth daily. 30 tablet 3  . aspirin 81 MG EC tablet Take 1 tablet (81 mg total) by mouth daily.    [name] atorvastatin (LIPITOR) 80 MG tablet Take 1 tablet (80 mg total) by mouth daily. 30 tablet 1  . hydrALAZINE (APRESOLINE) 100 MG tablet Take 1 tablet (100 mg total) by mouth 3 (three) times daily. 90 tablet 3  . isosorbide mononitrate (IMDUR) 30 MG 24 hr tablet Take 1 tablet (30 mg total) by mouth daily. 30 tablet 6  . losartan (COZAAR) 25 MG tablet Take 1 tablet (25 mg total) by mouth daily. 30 tablet 3  . metoprolol succinate (TOPROL-XL) 25 MG 24 hr tablet Take 3 tablets (75 mg total) by mouth daily. 90 tablet 6  . omega-3 acid ethyl esters (LOVAZA) 1 g capsule Take 2 capsules (2 g total) by mouth 2 (two) times daily. 120 capsule 11  . potassium chloride SA (KLOR-CON M20) 20 MEQ tablet Take 3 tablets (60 mEq total) by mouth daily. 90 tablet 3  . rivaroxaban (XARELTO) 20 MG TABS tablet Take 1 tablet (20 mg total) by mouth daily with supper. 90 tablet 3  . torsemide (DEMADEX) 20 MG tablet Take 2 tablets (40 mg total) by mouth every morning. May also take 1 tablet (20 mg total) as needed (for weight 343 or greater). 90 tablet 3  . acetaminophen (TYLENOL) 325 MG tablet Take 1-2 tablets (325-650 mg total) by mouth every 6 (six) hours as needed for mild pain.     No current facility-administered medications for this encounter.     Allergies:   Patient has no known allergies.   Social History:  The patient  reports that he has quit smoking. He has never used smokeless tobacco. He reports previous alcohol use. He reports that he does not use drugs.   Family History:  The patient's family history includes Colon cancer in his father; Hypertension in his mother; Pancreatic cancer in his  father; Prostate cancer in his father.   ROS:  Please see the history of present illness.   All other systems are personally reviewed and negative.   Exam:  (Video/Tele Health Call; Exam is subjective and or/visual.) General:  Speaks in full sentences. No resp difficulty. Lungs: Normal respiratory effort with conversation.  Abdomen: Non-distended per patient report Extremities: Pt denies edema. Neuro: Alert & oriented x 3.   Recent Labs: 02/17/2018: Magnesium 1.8 03/24/2018: B Natriuretic Peptide 285.1 04/04/2018: TSH 2.37 06/15/2018: ALT 15; Hemoglobin 12.6; Platelets 216.0 08/03/2018: BUN 18; Creatinine, Ser 1.64; Potassium 3.6; Sodium 140  Personally reviewed   Wt Readings from Last 3 Encounters:  08/31/18 (!) 158.8 kg (350 lb)  07/20/18 (!) 162 kg (357 lb 3.2 oz)  06/15/18 (!) 162.1 kg (357 lb 6.4 oz)      ASSESSMENT AND PLAN:  1. Chronic combined CHF, NICM -  Echo 8/2019EF 20-25%. He does not have an ICD.  - R/LHC normal cors. RA 17 PCWP 21 - NYHA III symptoms. Volume status sounds okay. Weight down 7 lbs from last visit.  - Continue torsemide 40 mg daily. Take extra 20 mg PRN edema - Continue hydralazine 100 mg TID.   - Continue imdur 30 mg daily - Continue losartan 25 mg daily. - Increase Toprol to 100 mg daily - Add spiro 12.5 mg daily. Cut potassium back to 40 meq daily. BMET in 1 week at PCP's office.  - Reinforced fluid restriction to < 2 L daily, sodium restriction to less than 2000 mg daily, and the importance of daily weights.    2. CKD III - Baseline Cr 1.7 - 1.9.  - Creatinine stable 1.64 after addition of losartan.   3. PAF - New diagnosis 08/2017,which was being treated with rate control. - S/P DC-CV on 10/2 and 03/01/2018 - Continue amio 200 mg daily. Check LFTs and TSH with labs next week.  - Increase Toprol XL to 100 mg daily as above - Continue Xarelto 20 mg daily.   4.H/o CVA - No remainingdeficit. - On Xarelto chronically.  5. HTN -  Med changes as above.   6.AAA -4.7 cm by echo8/2019  7. OSA - Has daytime fatigue and snores. Suspect he has sleep apnea.  - Being set up for sleep study with Dr. Radford Pax. Currently scheduled for May, but suspect will be delayed with COVID.   8. Morbid obesity - Needs significant weight loss. No change.   COVID screen The patient does not have any symptoms that suggest any further testing/ screening at this time.  Social distancing reinforced today.  Relevant cardiac medications were reviewed at length with the patient today.   The patient does not have concerns regarding their medications at this time.   The following changes were made today: Start spiro 12.5 mg daily. Cut back potassium to 40 meq daily. Increase Toprol XL to 100 mg daily. CMET and TSH  in 1 week at PCP. Provided refills on potassium, losartan, and amiodarone.   Recommended follow-up: Keep follow up in June with an echo. Follow up in 4 weeks by telephone with me  Today, I have spent 15 minutes with the patient with telehealth technology discussing the above issues .    Signed, Georgiana Shore, NP  08/31/2018 9:59 AM  [facility] [address] [facility] [place] [address] [phone] (office) [phone] (fax)

## 2018-08-31 NOTE — Addendum Note (Signed)
Encounter addended by: Valeda Malm, RN on: 08/31/2018 4:33 PM  Actions taken: Clinical Note Signed

## 2018-08-31 NOTE — Progress Notes (Signed)
Rx for CMET and TSH faxed to PCP office. Pt will make appt for 1 week and fax results to our office

## 2018-08-31 NOTE — Patient Instructions (Addendum)
1. START Spironolactone 12.5 mg (1/2 tab) daily   2. DECREASE Potassium to 40 meq (2 Tabs) daily   3. INCREASE Toprol XL to 100 mg (1 tab) daily   4. Take an extra torsemide 20mg  (1 tab) AS NEEDED for swelling in legs   5. LABS: Check CMET and TSH in 1 week at PCP's office and fax to [place] at [phone].    We will only contact you if something comes back abnormal or we need to make some           Changes. Otherwise no news is good news!  6. Refills have been sent to pharmacy for potassium, losartan, and amio.   7. Follow up by telephone only in 4 weeks with Nurse Practitioner: An appointment as been made for May 8th, 2020 at 10am.

## 2018-08-31 NOTE — Progress Notes (Signed)
Spoke with patient this morning regarding instructions from NP visit.  AVS reviewed, all questions answered. Verbalized understanding. AVS sent via mychart. Rx for labs to be faxed to primary Dr office when he returns call with number.

## 2018-08-31 NOTE — Addendum Note (Signed)
Encounter addended by: Valeda Malm, RN on: 08/31/2018 10:29 AM  Actions taken: Order list changed, Diagnosis association updated, Clinical Note Signed

## 2018-08-31 NOTE — Addendum Note (Signed)
Encounter addended by: Georgiana Shore, NP on: 08/31/2018 10:58 AM  Actions taken: Clinical Note Signed

## 2018-09-06 ENCOUNTER — Encounter (HOSPITAL_BASED_OUTPATIENT_CLINIC_OR_DEPARTMENT_OTHER): Payer: BC Managed Care – PPO

## 2018-09-19 ENCOUNTER — Telehealth (HOSPITAL_COMMUNITY): Payer: Self-pay | Admitting: Cardiology

## 2018-09-19 ENCOUNTER — Ambulatory Visit (INDEPENDENT_AMBULATORY_CARE_PROVIDER_SITE_OTHER): Payer: BC Managed Care – PPO | Admitting: Internal Medicine

## 2018-09-19 DIAGNOSIS — E1140 Type 2 diabetes mellitus with diabetic neuropathy, unspecified: Secondary | ICD-10-CM

## 2018-09-19 DIAGNOSIS — Z794 Long term (current) use of insulin: Secondary | ICD-10-CM

## 2018-09-19 DIAGNOSIS — D539 Nutritional anemia, unspecified: Secondary | ICD-10-CM

## 2018-09-19 DIAGNOSIS — E538 Deficiency of other specified B group vitamins: Secondary | ICD-10-CM

## 2018-09-19 DIAGNOSIS — I10 Essential (primary) hypertension: Secondary | ICD-10-CM

## 2018-09-19 DIAGNOSIS — N184 Chronic kidney disease, stage 4 (severe): Secondary | ICD-10-CM | POA: Diagnosis not present

## 2018-09-19 DIAGNOSIS — I5042 Chronic combined systolic (congestive) and diastolic (congestive) heart failure: Secondary | ICD-10-CM

## 2018-09-19 DIAGNOSIS — M1000 Idiopathic gout, unspecified site: Secondary | ICD-10-CM

## 2018-09-19 DIAGNOSIS — M10332 Gout due to renal impairment, left wrist: Secondary | ICD-10-CM

## 2018-09-19 DIAGNOSIS — M25532 Pain in left wrist: Secondary | ICD-10-CM | POA: Diagnosis not present

## 2018-09-19 NOTE — Progress Notes (Signed)
Virtual Visit via Video Note  I connected with Jay Chen on 09/20/18 at  3:00 PM EDT by a video enabled telemedicine application and verified that I am speaking with the correct person using two identifiers.   I discussed the limitations of evaluation and management by telemedicine and the availability of in person appointments. The patient expressed understanding and agreed to proceed.  History of Present Illness: He checked in for a virtual visit.  He was not willing to come in for an in person visit due to the COVID-19 pandemic.  He complains of a 3-day history of left wrist pain and swelling with no prior trauma or injury.  He has a history of gout.  He says none of his other joints are bothering him.  He has not taken anything for the pain.  NSAIDs are contraindicated due to renal insufficiency.  He denied fever, chills, chest pain, shortness of breath, polys, dizziness, lightheadedness, or peripheral edema.    Observations/Objective: Obese male.  No acute distress.  He was calm, cooperative, and appropriate.  He showed me his left wrist on the video feed and it did appear to be swollen.  There was also a slight decrease in the range of motion.  Lab Results  Component Value Date   WBC 7.0 09/20/2018   HGB 13.8 09/20/2018   HCT 40.9 09/20/2018   PLT 281.0 09/20/2018   GLUCOSE 138 (H) 09/20/2018   CHOL 116 01/01/2018   TRIG 85 01/01/2018   HDL 24 (L) 01/01/2018   LDLDIRECT 104.0 10/12/2016   LDLCALC 75 01/01/2018   ALT 17 09/20/2018   AST 14 09/20/2018   NA 140 09/20/2018   K 4.6 09/20/2018   CL 99 09/20/2018   CREATININE 1.84 (H) 09/20/2018   BUN 28 (H) 09/20/2018   CO2 34 (H) 09/20/2018   TSH 1.32 09/20/2018   PSA 3.87 04/04/2018   INR 1.01 09/19/2009   HGBA1C 6.2 09/20/2018   MICROALBUR 1.9 09/20/2018     Assessment and Plan: He has nontraumatic wrist pain and swelling, a history of gout, and his uric acid level is elevated 8.8 with a slightly elevated sed rate.  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  I have asked him to take a 6-day course of methylprednisolone and to start taking colchicine.  Also noted on the labs are B12 deficiency.  I have asked him to come in to restart B12 injections.  Labs show that his blood sugars are adequately well controlled.  His renal function has declined slightly so I have asked him to continue to maintain control of his blood pressure and his blood sugar.  Also asked him to avoid NSAIDs.   Follow Up Instructions: He agrees to comply with the above recommendations.  He will let me know if he develops any new or worsening symptoms.    I discussed the assessment and treatment plan with the patient. The patient was provided an opportunity to ask questions and all were answered. The patient agreed with the plan and demonstrated an understanding of the instructions.   The patient was advised to call back or seek an in-person evaluation if the symptoms worsen or if the condition fails to improve as anticipated.  I provided 25 minutes of non-face-to-face time during this encounter.   Scarlette Calico, MD

## 2018-09-19 NOTE — Telephone Encounter (Signed)
Pt returned call to report PCP did not receive orders for repeat labs  Orders placed and routed to PCP via EPIC

## 2018-09-20 ENCOUNTER — Encounter: Payer: Self-pay | Admitting: Internal Medicine

## 2018-09-20 ENCOUNTER — Other Ambulatory Visit (INDEPENDENT_AMBULATORY_CARE_PROVIDER_SITE_OTHER): Payer: BC Managed Care – PPO

## 2018-09-20 ENCOUNTER — Telehealth: Payer: Self-pay

## 2018-09-20 DIAGNOSIS — E1140 Type 2 diabetes mellitus with diabetic neuropathy, unspecified: Secondary | ICD-10-CM

## 2018-09-20 DIAGNOSIS — D539 Nutritional anemia, unspecified: Secondary | ICD-10-CM

## 2018-09-20 DIAGNOSIS — M25532 Pain in left wrist: Secondary | ICD-10-CM

## 2018-09-20 DIAGNOSIS — E538 Deficiency of other specified B group vitamins: Secondary | ICD-10-CM | POA: Diagnosis not present

## 2018-09-20 DIAGNOSIS — M1000 Idiopathic gout, unspecified site: Secondary | ICD-10-CM | POA: Diagnosis not present

## 2018-09-20 DIAGNOSIS — Z794 Long term (current) use of insulin: Secondary | ICD-10-CM

## 2018-09-20 DIAGNOSIS — M10332 Gout due to renal impairment, left wrist: Secondary | ICD-10-CM | POA: Insufficient documentation

## 2018-09-20 DIAGNOSIS — N184 Chronic kidney disease, stage 4 (severe): Secondary | ICD-10-CM | POA: Diagnosis not present

## 2018-09-20 DIAGNOSIS — I10 Essential (primary) hypertension: Secondary | ICD-10-CM

## 2018-09-20 LAB — CBC WITH DIFFERENTIAL/PLATELET
Basophils Absolute: 0.1 10*3/uL (ref 0.0–0.1)
Basophils Relative: 0.9 % (ref 0.0–3.0)
Eosinophils Absolute: 0 10*3/uL (ref 0.0–0.7)
Eosinophils Relative: 0.1 % (ref 0.0–5.0)
HCT: 40.9 % (ref 39.0–52.0)
Hemoglobin: 13.8 g/dL (ref 13.0–17.0)
Lymphocytes Relative: 10.6 % — ABNORMAL LOW (ref 12.0–46.0)
Lymphs Abs: 0.7 10*3/uL (ref 0.7–4.0)
MCHC: 33.7 g/dL (ref 30.0–36.0)
MCV: 90.3 fl (ref 78.0–100.0)
Monocytes Absolute: 0.2 10*3/uL (ref 0.1–1.0)
Monocytes Relative: 3.1 % (ref 3.0–12.0)
Neutro Abs: 6 10*3/uL (ref 1.4–7.7)
Neutrophils Relative %: 85.3 % — ABNORMAL HIGH (ref 43.0–77.0)
Platelets: 281 10*3/uL (ref 150.0–400.0)
RBC: 4.53 Mil/uL (ref 4.22–5.81)
RDW: 15.1 % (ref 11.5–15.5)
WBC: 7 10*3/uL (ref 4.0–10.5)

## 2018-09-20 LAB — MICROALBUMIN / CREATININE URINE RATIO
Creatinine,U: 128.2 mg/dL
Microalb Creat Ratio: 1.5 mg/g (ref 0.0–30.0)
Microalb, Ur: 1.9 mg/dL (ref 0.0–1.9)

## 2018-09-20 LAB — COMPREHENSIVE METABOLIC PANEL
ALT: 17 U/L (ref 0–53)
AST: 14 U/L (ref 0–37)
Albumin: 3.8 g/dL (ref 3.5–5.2)
Alkaline Phosphatase: 110 U/L (ref 39–117)
BUN: 28 mg/dL — ABNORMAL HIGH (ref 6–23)
CO2: 34 mEq/L — ABNORMAL HIGH (ref 19–32)
Calcium: 9 mg/dL (ref 8.4–10.5)
Chloride: 99 mEq/L (ref 96–112)
Creatinine, Ser: 1.84 mg/dL — ABNORMAL HIGH (ref 0.40–1.50)
GFR: 46.5 mL/min — ABNORMAL LOW (ref >60.00)
Glucose, Bld: 138 mg/dL — ABNORMAL HIGH (ref 70–99)
Potassium: 4.6 mEq/L (ref 3.5–5.1)
Sodium: 140 mEq/L (ref 135–145)
Total Bilirubin: 0.7 mg/dL (ref 0.2–1.2)
Total Protein: 7 g/dL (ref 6.0–8.3)

## 2018-09-20 LAB — IBC PANEL
Iron: 45 ug/dL (ref 42–165)
Saturation Ratios: 13.6 % — ABNORMAL LOW (ref 20.0–50.0)
Transferrin: 237 mg/dL (ref 212.0–360.0)

## 2018-09-20 LAB — HEMOGLOBIN A1C: Hgb A1c MFr Bld: 6.2 % (ref 4.6–6.5)

## 2018-09-20 LAB — RETICULOCYTES
ABS Retic: 45400 cells/uL (ref 25000–9000)
Retic Ct Pct: 1 %

## 2018-09-20 LAB — URINALYSIS, ROUTINE W REFLEX MICROSCOPIC
Bilirubin Urine: NEGATIVE
Hgb urine dipstick: NEGATIVE
Ketones, ur: NEGATIVE
Leukocytes,Ua: NEGATIVE
Nitrite: NEGATIVE
Specific Gravity, Urine: 1.01 (ref 1.000–1.030)
Total Protein, Urine: NEGATIVE
Urine Glucose: NEGATIVE
Urobilinogen, UA: 1 (ref 0.0–1.0)
pH: 7.5 (ref 5.0–8.0)

## 2018-09-20 LAB — SEDIMENTATION RATE: Sed Rate: 29 mm/hr — ABNORMAL HIGH (ref 0–20)

## 2018-09-20 LAB — FERRITIN: Ferritin: 46.5 ng/mL (ref 22.0–322.0)

## 2018-09-20 LAB — TSH: TSH: 1.32 u[IU]/mL (ref 0.35–4.50)

## 2018-09-20 LAB — URIC ACID: Uric Acid, Serum: 8.8 mg/dL — ABNORMAL HIGH (ref 4.0–7.8)

## 2018-09-20 LAB — VITAMIN B12: Vitamin B-12: 165 pg/mL — ABNORMAL LOW (ref 211–911)

## 2018-09-20 MED ORDER — COLCHICINE 0.6 MG PO CAPS: 1.0000 | ORAL_CAPSULE | Freq: Two times a day (BID) | ORAL | 1 refills | Status: DC

## 2018-09-20 MED ORDER — METHYLPREDNISOLONE 4 MG PO TBPK: ORAL_TABLET | ORAL | 0 refills | Status: AC

## 2018-09-20 NOTE — Telephone Encounter (Signed)
Pt requesting medication to treat gout. Informed pt that we are waiting on the lab results to come back.

## 2018-09-20 NOTE — Telephone Encounter (Signed)
Copied from [place] [phone]. Topic: General - Other >> Sep 20, 2018  9:59 AM Yvette Rack wrote: Reason for CRM: Pt stated he had a virtual visit yesterday 09/19/18 and a Rx was suppose to be sent to his pharmacy but when he checked with the pharmacy they had not received a Rx. Pt requests that the Rx be sent to [facility], [place] [phone] (Phone) [phone] (Fax) >> Sep 20, 2018 10:20 AM Cairrikier Dian Queen, CMA wrote: lvm for pt asking the name of the medication or what the medication was treating.  >> Sep 20, 2018 11:15 AM Virl Axe D wrote: Pt stated Dr. [name] was sending in an anti inflammatory related to resting gout. Please advise.

## 2018-09-20 NOTE — Telephone Encounter (Signed)
Pt informed that rx has been sent. Pt has read my chart message.

## 2018-09-22 ENCOUNTER — Ambulatory Visit (INDEPENDENT_AMBULATORY_CARE_PROVIDER_SITE_OTHER): Payer: BC Managed Care – PPO

## 2018-09-22 DIAGNOSIS — E538 Deficiency of other specified B group vitamins: Secondary | ICD-10-CM | POA: Diagnosis not present

## 2018-09-22 MED ORDER — CYANOCOBALAMIN 1000 MCG/ML IJ SOLN
1000.0000 ug | Freq: Once | INTRAMUSCULAR | Status: AC
  Administered 2018-09-22: 1000 ug via INTRAMUSCULAR

## 2018-09-22 NOTE — Progress Notes (Signed)
b12 Injection given.   Stacy J Burns, MD  

## 2018-09-22 NOTE — Progress Notes (Signed)
Error

## 2018-09-28 NOTE — Progress Notes (Signed)
Heart Failure TeleHealth Note  Due to national recommendations of social distancing due to [place] 19, Audio/video telehealth visit is felt to be most appropriate for this patient at this time.  See MyChart message from today for patient consent regarding telehealth for [facility].  Date:  09/29/2018   ID:  [name], DOB 1966-04-28, MRN [id number]  Location: Home  Provider location: [place] [facility] Heart Failure Type of Visit: Established patient  PCP:  [name], MD  Cardiologist:  Quay Burow, MD Primary HF: Dr [name]   Chief Complaint: Heart Failure   History of Present Illness: Jay Chen is a 53 y.o. 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  Admitted from [facility] clinic 02/15/18 with acute on chronic combined CHF and marked volume overload.Diuresed with IV lasix 40 pounds. Diuretics held on the day of discharge due to increased creatinine.He underwent RHC/LHC with normal cors and moderate mixed pulmonary venous/arterial HTN. HF medications optimized. He was not placed on spiro or arb due to elevated creatinine. Once diuresed he underwent successful DC-CV. He was discharged to inpatient rehab. He was discharged from rehab on 03/01/18. On the day of discharge he was sent for successful cardioversion.   Admitted 11/1 - 03/28/18 with BLE pain. Work up unremarkable apart from chronic Arthritis/gout. Treated with allopurinol/prednisone. Continued on home torsemide.  Last seen in HF clinic 07/20/18. Weight was up 23 lbs and he was told to take an additional torsemide. Losartan was also added. Weight was 357 lbs that day. Repeat labs after med change with stable creatinine 1.64, K 3.6.  He presents via Psychiatric nurse for a telehealth visit today.   Overall feeling fine. Denies SOB/PND/Orthopnea. Does admit SOB with steps.  Ongoing daytime fatigue.  He has not had sleep study due to COVID restrictions.  No brbpr. Appetite ok. No fever or chills. Weight at home 345 pounds. Taking all medications.   he denies symptoms worrisome for COVID 19.   Past Medical History:  Diagnosis Date  . CKD (chronic kidney disease), stage III ([place])   . Congestive heart failure [facility]) May of 2011   Felt to have cor pulmonale; EF 45 to 50% from echo in May 2011  . Cor pulmonale (chronic) ([place])   . Gallstones   . Gout    "take daily RX" (02/15/2018)  . History of kidney stones   . Hyperlipidemia   . Hypertension   . Morbid obesity ([place])   . Persistent atrial fibrillation    [name] 12/28/2017  . Sleep apnea    "dx'd; couldn't tolerate CPAP" (02/15/2018)  . Thoracic aneurysm    a. 4.8cm thoracic aortic aneurysm by CT 2013.  [name] Thoracic aortic aneurysm ([place])    known/notes 12/28/2017  . Type II diabetes mellitus ([place])    Past Surgical History:  Procedure Laterality Date  . CARDIOVERSION N/A 02/22/2018   Procedure: CARDIOVERSION;  Surgeon: Jolaine Artist, MD;  Location: [facility] ENDOSCOPY;  Service: Cardiovascular;  Laterality: N/A;  . CARDIOVERSION N/A 03/01/2018   Procedure: CARDIOVERSION;  Surgeon: Jolaine Artist, MD;  Location: [place];  Service: Cardiovascular;  Laterality: N/A;  . LAPAROSCOPIC CHOLECYSTECTOMY  2000  . RIGHT/LEFT HEART CATH AND CORONARY ANGIOGRAPHY N/A 02/20/2018   Procedure: RIGHT/LEFT HEART CATH AND CORONARY ANGIOGRAPHY;  Surgeon: Jolaine Artist, MD;  Location:  [place] INVASIVE CV LAB;  Service: Cardiovascular;  Laterality: N/A;  . US ECHOCARDIOGRAPHY  09/21/2009   EF 45-50%; Cavity size was severely dilated, severe concentric hypertrophy and normal wall motion     Current Outpatient Medications  Medication Sig Dispense Refill  . allopurinol (ZYLOPRIM) 100 MG tablet Take 1 tablet (100 mg total) by mouth daily. 90 tablet 1  . amiodarone (PACERONE) 200 MG tablet Take 1 tablet (200 mg total) by mouth  daily. 30 tablet 5  . aspirin 81 MG EC tablet Take 1 tablet (81 mg total) by mouth daily.    [name] atorvastatin (LIPITOR) 80 MG tablet Take 1 tablet (80 mg total) by mouth daily. 30 tablet 1  . Colchicine 0.6 MG CAPS Take 1 capsule by mouth 2 (two) times a day. 60 capsule 1  . hydrALAZINE (APRESOLINE) 100 MG tablet Take 1 tablet (100 mg total) by mouth 3 (three) times daily. 90 tablet 3  . isosorbide mononitrate (IMDUR) 30 MG 24 hr tablet Take 1 tablet (30 mg total) by mouth daily. 30 tablet 6  . losartan (COZAAR) 25 MG tablet Take 1 tablet (25 mg total) by mouth daily. Please cancel all previous orders for current medication. Change in dosage or pill size. 30 tablet 5  . metoprolol succinate (TOPROL-XL) 100 MG 24 hr tablet Take 1 tablet (100 mg total) by mouth daily. 30 tablet 5  . omega-3 acid ethyl esters (LOVAZA) 1 g capsule Take 2 capsules (2 g total) by mouth 2 (two) times daily. 120 capsule 11  . potassium chloride SA (KLOR-CON M20) 20 MEQ tablet Take 2 tablets (40 mEq total) by mouth daily. 60 tablet 5  . rivaroxaban (XARELTO) 20 MG TABS tablet Take 1 tablet (20 mg total) by mouth daily with supper. 90 tablet 3  . spironolactone (ALDACTONE) 25 MG tablet Take 0.5 tablets (12.5 mg total) by mouth daily. Please cancel all previous orders for current medication. Change in dosage or pill size. 15 tablet 5  . torsemide (DEMADEX) 20 MG tablet Take 2 tablets (40 mg total) by mouth every morning. May also take 1 tablet (20 mg total) as needed (for weight 343 or greater). 90 tablet 3  . acetaminophen (TYLENOL) 325 MG tablet Take 1-2 tablets (325-650 mg total) by mouth every 6 (six) hours as needed for mild pain. (Patient not taking: Reported on 09/29/2018)     No current facility-administered medications for this encounter.     Allergies:   Patient has no known allergies.   Social History:  The patient  reports that he has quit smoking. He has never used smokeless tobacco. He reports previous alcohol  use. He reports that he does not use drugs.   Family History:  The patient's family history includes Colon cancer in his father; Hypertension in his mother; Pancreatic cancer in his father; Prostate cancer in his father.   ROS:  Please see the history of present illness.   All other systems are personally reviewed and negative.   Exam:  Tele Health Call; Exam is subjective  General:  Speaks in full sentences. No resp difficulty. Lungs: Normal respiratory effort with conversation.  Abdomen: Non-distended per patient report Extremities: Pt denies edema. Neuro: Alert & oriented x 3.   Recent Labs: 02/17/2018: Magnesium 1.8 03/24/2018: B Natriuretic Peptide 285.1 09/20/2018: ALT 17; BUN 28; Creatinine, Ser 1.84; Hemoglobin 13.8; Platelets 281.0; Potassium 4.6; Sodium 140; TSH 1.32  Personally reviewed   Wt Readings from Last 3 Encounters:  09/29/18 (!) 156.5 kg (  345 lb)  08/31/18 (!) 158.8 kg (350 lb)  07/20/18 (!) 162 kg (357 lb 3.2 oz)      ASSESSMENT AND PLAN: 1.Chronic combined CHF, NICM - Echo 8/2019EF 20-25%. He does not have an ICD.  - R/LHC normal cors. RA 17 PCWP 21 -NYHA II-III. Volume status sound stable.  - Continue torsemide 40 mg daily. Take extra 20 mg PRN edema - Continue hydralazine 100 mg TID.  - Continue imdur 30 mg daily - Continue losartan 25 mg daily. - Continue Toprol to 100 mg daily - Continue spiro 12.5 mg daily.  - Stable BMET on 09/20/18.   2. CKD III - Baseline Cr 1.7 - 1.9. I reviewed BMET from 09/20/18. This was stable.     3. PAF - New diagnosis 08/2017,which was being treated with rate control. - S/P DC-CV on 10/2 and 03/01/2018 - Continue amio 200 mg daily.  - Increase Toprol XL to 100 mg daily as above - Continue Xarelto 20 mg daily.   4.H/o CVA - No remainingdeficit. - On Xarelto chronically.  5. HTN -Med changes as above.   6.AAA -4.7 cm by echo8/2019  7. OSA - Has daytime fatigue and snores. Suspect he has  sleep apnea. Sleep study has been cancelled due to COVD precautions.  Set up home sleep study with Dr Radford Pax to read.   8. Morbid obesity Body mass index is 52.46 kg/m.  Discussed portion control.   COVID screen The patient does not have any symptoms that suggest any further testing/ screening at this time.  Social distancing reinforced today.  Patient Risk: After full review of this patients clinical status, I feel that they are at moderate risk for cardiac decompensation at this time.  Relevant cardiac medications were reviewed at length with the patient today. The patient does not have concerns regarding their medications at this time.   The following changes were made today:  Set up home sleep study with Dr Radford Pax to interpret results.    Recommended follow-up:  He has follow up in June with Dr [name] and an ECHO.  Today, I have spent  minutes with the patient with telehealth technology discussing the above issues .    Jeanmarie Hubert, NP  09/29/2018 10:37 AM  [place] [address] [phone] (office) [phone] (fax)

## 2018-09-29 ENCOUNTER — Ambulatory Visit (HOSPITAL_COMMUNITY)
Admission: RE | Admit: 2018-09-29 | Discharge: 2018-09-29 | Disposition: A | Discharge status: Home / Self Care | Payer: BC Managed Care – PPO | Source: Ambulatory Visit | Attending: Internal Medicine | Admitting: Internal Medicine

## 2018-09-29 ENCOUNTER — Ambulatory Visit (INDEPENDENT_AMBULATORY_CARE_PROVIDER_SITE_OTHER): Payer: BC Managed Care – PPO

## 2018-09-29 ENCOUNTER — Other Ambulatory Visit: Payer: Self-pay

## 2018-09-29 ENCOUNTER — Other Ambulatory Visit (HOSPITAL_COMMUNITY): Payer: Self-pay

## 2018-09-29 VITALS — Wt 345.0 lb

## 2018-09-29 DIAGNOSIS — E538 Deficiency of other specified B group vitamins: Secondary | ICD-10-CM

## 2018-09-29 DIAGNOSIS — I5042 Chronic combined systolic (congestive) and diastolic (congestive) heart failure: Secondary | ICD-10-CM

## 2018-09-29 DIAGNOSIS — G4733 Obstructive sleep apnea (adult) (pediatric): Secondary | ICD-10-CM

## 2018-09-29 MED ORDER — HYDRALAZINE HCL 100 MG PO TABS: 100.0000 mg | ORAL_TABLET | Freq: Three times a day (TID) | ORAL | 6 refills | Status: AC

## 2018-09-29 MED ORDER — ISOSORBIDE MONONITRATE ER 30 MG PO TB24: 30.0000 mg | ORAL_TABLET | Freq: Every day | ORAL | 6 refills | Status: AC

## 2018-09-29 MED ORDER — CYANOCOBALAMIN 1000 MCG/ML IJ SOLN
1000.0000 ug | Freq: Once | INTRAMUSCULAR | Status: AC
  Administered 2018-09-29: 1000 ug via INTRAMUSCULAR

## 2018-09-29 NOTE — Telephone Encounter (Signed)
Pt called asking for refills for isosorbide and hydralazine

## 2018-09-29 NOTE — Progress Notes (Signed)
Medical screening examination/treatment/procedure(s) were performed by non-physician practitioner and as supervising physician I was immediately available for consultation/collaboration. I agree with above. Analaura Messler, MD   

## 2018-09-29 NOTE — Patient Instructions (Signed)
You have been referred to complete a at home sleep study test, someone will call to arrange this with you.  Keep future follow-up appointments.   Please call the office at [phone], option 2 for the nurses with any questions or concerns.

## 2018-10-02 ENCOUNTER — Encounter (HOSPITAL_BASED_OUTPATIENT_CLINIC_OR_DEPARTMENT_OTHER): Payer: BC Managed Care – PPO

## 2018-10-03 NOTE — Addendum Note (Signed)
Encounter addended by: Scarlette Calico, RN on: 10/03/2018 5:24 PM  Actions taken: Order list changed, Diagnosis association updated

## 2018-10-04 ENCOUNTER — Telehealth (HOSPITAL_COMMUNITY): Payer: Self-pay | Admitting: *Deleted

## 2018-10-04 ENCOUNTER — Telehealth (HOSPITAL_COMMUNITY): Payer: Self-pay

## 2018-10-04 ENCOUNTER — Telehealth (HOSPITAL_COMMUNITY): Payer: Self-pay | Admitting: Vascular Surgery

## 2018-10-04 NOTE — Telephone Encounter (Signed)
Left VM sleep lab to cancel pt 5/26 sleep study appt

## 2018-10-04 NOTE — Telephone Encounter (Signed)
Order, stop bang, OV note and demographics all faxed to [facility] for home sleep study

## 2018-10-04 NOTE — Telephone Encounter (Signed)
  Height: 5'8    Weight:345lbs BMI:52.46 STOP BANG RISK ASSESSMENT S (snore) Have you been told that you snore?     YES   T (tired) Are you often tired, fatigued, or sleepy during the day?   YES  O (obstruction) Do you stop breathing, choke, or gasp during sleep? YES   P (pressure) Do you have or are you being treated for high blood pressure? YES   B (BMI) Is your body index greater than 35 kg/m? YES   A (age) Are you 55 years old or older? YES   N (neck) Do you have a neck circumference greater than 16 inches?   YES/NO   G (gender) Are you a male? YES   TOTAL STOP/BANG "YES" ANSWERS 7                                                                       For Office Use Only              Procedure Order Form    YES to 3+ Stop Bang questions OR two clinical symptoms - patient qualifies for WatchPAT (CPT 95800)     Submit: This Form + Patient Face Sheet + Clinical Note via CloudPAT or Fax: [phone]         Clinical Notes: Will consult Sleep Specialist and refer for management of therapy due to patient increased risk of Sleep Apnea. Ordering a sleep study due to the following two clinical symptoms: Excessive daytime sleepiness G47.10 / Gastroesophageal reflux K21.9 / Nocturia R35.1 / Morning Headaches G44.221 / Difficulty concentrating R41.840 / Memory problems or poor judgment G31.84 / Personality changes or irritability R45.4 / Loud snoring R06.83 / Depression F32.9 / Unrefreshed by sleep G47.8 / Impotence N52.9 / History of high blood pressure R03.0 / Insomnia G47.00    I understand that I am proceeding with a home sleep apnea test as ordered by my treating physician. I understand that untreated sleep apnea is a serious cardiovascular risk factor and it is my responsibility to perform the test and seek management for sleep apnea. I will be contacted with the results and be managed for sleep apnea by a local sleep physician. I will be receiving equipment and further instructions  from [facility]. I shall promptly ship back the equipment via the included mailing label. I understand my insurance will be billed for the test and as the patient I am responsible for any insurance related out-of-pocket costs incurred. I have been provided with written instructions and can call for additional video or telephonic instruction, with 24-hour availability of qualified personnel to answer any questions: Patient Help Desk [phone].  Patient Signature ______________________________________________________   Date______________________ Patient Telemedicine Verbal Consent

## 2018-10-06 ENCOUNTER — Ambulatory Visit (INDEPENDENT_AMBULATORY_CARE_PROVIDER_SITE_OTHER): Payer: BC Managed Care – PPO

## 2018-10-06 DIAGNOSIS — E538 Deficiency of other specified B group vitamins: Secondary | ICD-10-CM

## 2018-10-06 MED ORDER — CYANOCOBALAMIN 1000 MCG/ML IJ SOLN
1000.0000 ug | Freq: Once | INTRAMUSCULAR | Status: AC
  Administered 2018-10-06: 1000 ug via INTRAMUSCULAR

## 2018-10-06 NOTE — Progress Notes (Signed)
Medical screening examination/treatment/procedure(s) were performed by non-physician practitioner and as supervising physician I was immediately available for consultation/collaboration. I agree with above. Mieshia Pepitone, MD   

## 2018-10-17 ENCOUNTER — Encounter (HOSPITAL_BASED_OUTPATIENT_CLINIC_OR_DEPARTMENT_OTHER): Payer: BC Managed Care – PPO

## 2018-11-07 ENCOUNTER — Ambulatory Visit (INDEPENDENT_AMBULATORY_CARE_PROVIDER_SITE_OTHER): Payer: BC Managed Care – PPO

## 2018-11-07 DIAGNOSIS — E538 Deficiency of other specified B group vitamins: Secondary | ICD-10-CM

## 2018-11-07 MED ORDER — CYANOCOBALAMIN 1000 MCG/ML IJ SOLN
1000.0000 ug | Freq: Once | INTRAMUSCULAR | Status: AC
  Administered 2018-11-07: 11:00:00 1000 ug via INTRAMUSCULAR

## 2018-11-07 NOTE — Progress Notes (Signed)
I have reviewed and agree.

## 2018-11-07 NOTE — Progress Notes (Signed)
Error

## 2018-11-08 ENCOUNTER — Other Ambulatory Visit (HOSPITAL_COMMUNITY): Payer: Self-pay | Admitting: Cardiology

## 2018-11-08 ENCOUNTER — Other Ambulatory Visit (HOSPITAL_COMMUNITY): Payer: Self-pay | Admitting: Adult Health

## 2018-11-08 DIAGNOSIS — I4819 Other persistent atrial fibrillation: Secondary | ICD-10-CM

## 2018-11-13 ENCOUNTER — Ambulatory Visit (HOSPITAL_BASED_OUTPATIENT_CLINIC_OR_DEPARTMENT_OTHER)
Admission: RE | Admit: 2018-11-13 | Discharge: 2018-11-13 | Disposition: A | Discharge status: Home / Self Care | Payer: BC Managed Care – PPO | Source: Ambulatory Visit | Attending: Cardiology | Admitting: Cardiology

## 2018-11-13 ENCOUNTER — Other Ambulatory Visit (HOSPITAL_COMMUNITY): Payer: BC Managed Care – PPO

## 2018-11-13 ENCOUNTER — Ambulatory Visit (HOSPITAL_COMMUNITY)
Admission: RE | Admit: 2018-11-13 | Discharge: 2018-11-13 | Disposition: A | Discharge status: Home / Self Care | Payer: BC Managed Care – PPO | Source: Ambulatory Visit | Attending: Internal Medicine | Admitting: Internal Medicine

## 2018-11-13 ENCOUNTER — Other Ambulatory Visit: Payer: Self-pay

## 2018-11-13 VITALS — BP 153/87 | HR 71 | Ht 68.0 in | Wt 362.2 lb

## 2018-11-13 DIAGNOSIS — I714 Abdominal aortic aneurysm, without rupture: Secondary | ICD-10-CM | POA: Diagnosis not present

## 2018-11-13 DIAGNOSIS — Z8249 Family history of ischemic heart disease and other diseases of the circulatory system: Secondary | ICD-10-CM | POA: Diagnosis not present

## 2018-11-13 DIAGNOSIS — E785 Hyperlipidemia, unspecified: Secondary | ICD-10-CM | POA: Diagnosis not present

## 2018-11-13 DIAGNOSIS — I130 Hypertensive heart and chronic kidney disease with heart failure and stage 1 through stage 4 chronic kidney disease, or unspecified chronic kidney disease: Secondary | ICD-10-CM | POA: Insufficient documentation

## 2018-11-13 DIAGNOSIS — I5042 Chronic combined systolic (congestive) and diastolic (congestive) heart failure: Secondary | ICD-10-CM

## 2018-11-13 DIAGNOSIS — G4733 Obstructive sleep apnea (adult) (pediatric): Secondary | ICD-10-CM | POA: Insufficient documentation

## 2018-11-13 DIAGNOSIS — Z6841 Body Mass Index (BMI) 40.0 and over, adult: Secondary | ICD-10-CM | POA: Insufficient documentation

## 2018-11-13 DIAGNOSIS — Z87891 Personal history of nicotine dependence: Secondary | ICD-10-CM | POA: Diagnosis not present

## 2018-11-13 DIAGNOSIS — Z7982 Long term (current) use of aspirin: Secondary | ICD-10-CM | POA: Diagnosis not present

## 2018-11-13 DIAGNOSIS — N183 Chronic kidney disease, stage 3 unspecified: Secondary | ICD-10-CM

## 2018-11-13 DIAGNOSIS — I509 Heart failure, unspecified: Secondary | ICD-10-CM

## 2018-11-13 DIAGNOSIS — E1122 Type 2 diabetes mellitus with diabetic chronic kidney disease: Secondary | ICD-10-CM | POA: Diagnosis not present

## 2018-11-13 DIAGNOSIS — Z7901 Long term (current) use of anticoagulants: Secondary | ICD-10-CM | POA: Diagnosis not present

## 2018-11-13 DIAGNOSIS — I428 Other cardiomyopathies: Secondary | ICD-10-CM | POA: Diagnosis not present

## 2018-11-13 DIAGNOSIS — I480 Paroxysmal atrial fibrillation: Secondary | ICD-10-CM | POA: Insufficient documentation

## 2018-11-13 DIAGNOSIS — Z79899 Other long term (current) drug therapy: Secondary | ICD-10-CM | POA: Insufficient documentation

## 2018-11-13 DIAGNOSIS — I10 Essential (primary) hypertension: Secondary | ICD-10-CM

## 2018-11-13 DIAGNOSIS — Z8673 Personal history of transient ischemic attack (TIA), and cerebral infarction without residual deficits: Secondary | ICD-10-CM | POA: Insufficient documentation

## 2018-11-13 DIAGNOSIS — M109 Gout, unspecified: Secondary | ICD-10-CM | POA: Diagnosis not present

## 2018-11-13 MED ORDER — LOSARTAN POTASSIUM 50 MG PO TABS: 50.0000 mg | ORAL_TABLET | Freq: Every day | ORAL | 5 refills | Status: AC

## 2018-11-13 NOTE — Progress Notes (Signed)
Advanced HF Clinic Note    Date:  11/13/2018   ID:  Jay Chen, DOB 09-24-1965, MRN [id number]  Location: Home  Provider location: [place] Advanced Heart Failure Type of Visit: Established patient  PCP:  Jay Lima, MD  Cardiologist:  Jay Burow, MD Primary HF: Jay Chen   Chief Complaint: Heart Failure    History of Present Illness:  Jay Chen is a 53 y.o. 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  Admitted from [facility] clinic 02/15/18 with acute on chronic combined CHF and marked volume overload.Diuresed with IV lasix 40 pounds. Diuretics held on the day of discharge due to increased creatinine.He underwent RHC/LHC with normal cors and moderate mixed pulmonary venous/arterial HTN. HF medications optimized. He was not placed on spiro or arb due to elevated creatinine. Once diuresed he underwent successful DC-CV. He was discharged to inpatient rehab. He was discharged from rehab on 03/01/18. On the day of discharge he was sent for successful cardioversion.   Admitted 11/1 - 03/28/18 with BLE pain. Work up unremarkable apart from chronic Arthritis/gout. Treated with allopurinol/prednisone. Continued on home torsemide.  Last seen in HF clinic 07/20/18. Weight was up 23 lbs and he was told to take an additional torsemide. Losartan was also added. Weight was 357 lbs that day. Repeat labs after med change with stable creatinine 1.64, K 3.6.  Here for routine f/u. Overall feeling fine. Walking about 1/4 mile every day. No CP, SOB, orthopnea or PND. Been much better about watching fluids. No edema. Taking all meds as prescribed. Not wearing CPAP. Has repeat sleep study soon. SBP 130-150. Creatinine runs 1.6-1.8  ECHO today EF 55-60% Personally reviewed   Past Medical History:  Diagnosis Date  . CKD (chronic kidney disease),  stage III ([place])   . Congestive heart failure [facility]) May of 2011   Felt to have cor pulmonale; EF 45 to 50% from echo in May 2011  . Cor pulmonale (chronic) ([place])   . Gallstones   . Gout    "take daily RX" (02/15/2018)  . History of kidney stones   . Hyperlipidemia   . Hypertension   . Morbid obesity ([place])   . Persistent atrial fibrillation    Jay Chen 12/28/2017  . Sleep apnea    "dx'd; couldn't tolerate CPAP" (02/15/2018)  . Thoracic aneurysm    a. 4.8cm thoracic aortic aneurysm by CT 2013.  [name] Thoracic aortic aneurysm ([place])    known/notes 12/28/2017  . Type II diabetes mellitus ([place])    Past Surgical History:  Procedure Laterality Date  . CARDIOVERSION N/A 02/22/2018   Procedure: CARDIOVERSION;  Surgeon: Jolaine Artist, MD;  Location: [facility] ENDOSCOPY;  Service: Cardiovascular;  Laterality: N/A;  . CARDIOVERSION N/A 03/01/2018   Procedure: CARDIOVERSION;  Surgeon: Jolaine Artist, MD;  Location: [place];  Service: Cardiovascular;  Laterality: N/A;  . LAPAROSCOPIC CHOLECYSTECTOMY  2000  . RIGHT/LEFT HEART CATH AND CORONARY ANGIOGRAPHY N/A 02/20/2018   Procedure: RIGHT/LEFT HEART CATH AND CORONARY ANGIOGRAPHY;  Surgeon: Jolaine Artist, MD;  Location: [place] CV LAB;  Service: Cardiovascular;  Laterality: N/A;  . US ECHOCARDIOGRAPHY  09/21/2009   EF 45-50%; Cavity size was severely dilated, severe concentric hypertrophy and normal wall motion     Current Outpatient Medications  Medication Sig Dispense Refill  . acetaminophen (  TYLENOL) 325 MG tablet Take 1-2 tablets (325-650 mg total) by mouth every 6 (six) hours as needed for mild pain.    [name] allopurinol (ZYLOPRIM) 100 MG tablet Take 1 tablet (100 mg total) by mouth daily. 90 tablet 1  . amiodarone (PACERONE) 200 MG tablet Take 1 tablet (200 mg total) by mouth daily. 30 tablet 5  . aspirin 81 MG EC tablet Take 1 tablet (81 mg total) by mouth daily.    [name] atorvastatin (LIPITOR) 80 MG tablet Take 1 tablet (80 mg total) by  mouth daily. 30 tablet 1  . Colchicine 0.6 MG CAPS Take 1 capsule by mouth 2 (two) times a day. 60 capsule 1  . hydrALAZINE (APRESOLINE) 100 MG tablet Take 1 tablet (100 mg total) by mouth 3 (three) times daily. 90 tablet 6  . isosorbide mononitrate (IMDUR) 30 MG 24 hr tablet Take 1 tablet (30 mg total) by mouth daily. 30 tablet 6  . losartan (COZAAR) 25 MG tablet Take 1 tablet (25 mg total) by mouth daily. Please cancel all previous orders for current medication. Change in dosage or pill size. 30 tablet 5  . metoprolol succinate (TOPROL-XL) 100 MG 24 hr tablet Take 1 tablet (100 mg total) by mouth daily. 30 tablet 5  . omega-3 acid ethyl esters (LOVAZA) 1 g capsule Take 2 capsules (2 g total) by mouth 2 (two) times daily. 120 capsule 11  . potassium chloride SA (KLOR-CON M20) 20 MEQ tablet Take 2 tablets (40 mEq total) by mouth daily. 60 tablet 5  . spironolactone (ALDACTONE) 25 MG tablet Take 0.5 tablets (12.5 mg total) by mouth daily. Please cancel all previous orders for current medication. Change in dosage or pill size. 15 tablet 5  . torsemide (DEMADEX) 20 MG tablet TAKE 2 TABLETS(40 MG) BY MOUTH EVERY MORNING. MAY ALSO TAKE 1 TABLET 20 MG TOTAL AS NEEDED FOR WEIGHT 343 OR GREATER 90 tablet 3  . XARELTO 20 MG TABS tablet TAKE 1 TABLET(20 MG) BY MOUTH DAILY WITH SUPPER 90 tablet 3   No current facility-administered medications for this encounter.     Allergies:   Patient has no known allergies.   Social History:  The patient  reports that he has quit smoking. He has never used smokeless tobacco. He reports previous alcohol use. He reports that he does not use drugs.   Family History:  The patient's family history includes Colon cancer in his father; Hypertension in his mother; Pancreatic cancer in his father; Prostate cancer in his father.   ROS:  Please see the history of present illness.   All other systems are personally reviewed and negative.   Vitals:   11/13/18 1141  BP: (!)  153/87  Pulse: 71  SpO2: 90%  Weight: (!) 164.3 kg (362 lb 3.2 oz)  Height: 5\' 8"  (1.727 m)    Exam:   General:  Well appearing. No resp difficulty HEENT: normal Neck: supple. no JVD. Carotids 2+ bilat; no bruits. No lymphadenopathy or thryomegaly appreciated. Cor: PMI nondisplaced. Regular rate & rhythm. No rubs, gallops or murmurs. Lungs: clear Abdomen: obese soft, nontender, nondistended. No hepatosplenomegaly. No bruits or masses. Good bowel sounds. Extremities: no cyanosis, clubbing, rash, tr edema Neuro: alert & orientedx3, cranial nerves grossly intact. moves all 4 extremities w/o difficulty. Affect pleasant .   Recent Labs: 02/17/2018: Magnesium 1.8 03/24/2018: B Natriuretic Peptide 285.1 09/20/2018: ALT 17; BUN 28; Creatinine, Ser 1.84; Hemoglobin 13.8; Platelets 281.0; Potassium 4.6; Sodium 140; TSH 1.32  Personally reviewed  Wt Readings from Last 3 Encounters:  11/13/18 (!) 164.3 kg (362 lb 3.2 oz)  09/29/18 (!) 156.5 kg (345 lb)  08/31/18 (!) 158.8 kg (350 lb)      ASSESSMENT AND PLAN: 1.Chronic combined CHF, NICM - Echo 8/2019EF 20-25%. He does not have an ICD.  - R/LHC normal cors. RA 17 PCWP 21 - Echo today EF 55-60% Personally reviewed - NYHA II. Volume status stable.  - Continue torsemide 40 mg daily. Take extra 20 mg PRN edema - Continue hydralazine 100 mg TID.  - Continue imdur 30 mg daily - With high BP will increase losartan to 50 mg daily. BMET 2 weeks - Continue Toprol to 100 mg daily - Continue spiro 12.5 mg daily.  - Stable BMET on 09/20/18.   2. CKD III - Baseline Cr 1.7 - 1.9. - Will refer to Nephrology - I reviewed BMET from 09/20/18. This was stable.  - Repeat BMET 2 weeks  3. PAF - New diagnosis 08/2017,which was being treated with rate control. - S/P DC-CV on 10/2 and 03/01/2018 - Continue amio 200 mg daily. Check CMET & TFTs - ContinueToprol XL 100 mg daily as above - Continue Xarelto 20 mg daily. No bleeding   4.H/o CVA  - No remainingdeficit. - On Xarelto chronically.  5. HTN -Med changes as above.   6.AAA -4.7 cm by echo8/2019 - Followed by Jay. Gwenlyn Found  7. OSA - Has daytime fatigue and snores. Suspect he has sleep apnea. - Set up home sleep study with Jay Radford Pax to read.   8. Morbid obesity Body mass index is 55.07 kg/m.  Discussed portion control.   EF has normalized. Will see one more time to ensure stability then graduate from HF Program to f/u with Jay. Gwenlyn Found.    Signed, Glori Bickers, MD  11/13/2018 11:42 AM  [place] [address] [facility] and [place] [address] [phone] (office) [phone] (fax)

## 2018-11-13 NOTE — Progress Notes (Signed)
  Echocardiogram 2D Echocardiogram has been performed.  Jennette Dubin 11/13/2018, 10:29 AM

## 2018-11-13 NOTE — Patient Instructions (Addendum)
INCREASED Losartan to 50mg  (1 tab) daily.  You have been referred to [facility]. They will contact you to schedule an office visit.   Follow up for Lab work in 2 weeks. We will call only if labs are abnormal.   Your physician recommends that you schedule a follow-up appointment in: 3 months with Dr. Trinna Post placed) and in 6 months with Dr. [name].   At the [facility], you and your health needs are our priority. As part of our continuing mission to provide you with exceptional heart care, we have created designated Provider Care Teams. These Care Teams include your primary Cardiologist (physician) and Advanced Practice Providers (APPs- Physician Assistants and Nurse Practitioners) who all work together to provide you with the care you need, when you need it.   You may see any of the following providers on your designated Care Team at your next follow up: [name] Dr Glori Bickers . Dr Loralie Champagne . [name], NP

## 2018-11-20 ENCOUNTER — Telehealth: Payer: Self-pay

## 2018-11-20 NOTE — Telephone Encounter (Signed)
Prior Auth started for Rx [employer]

## 2018-11-23 ENCOUNTER — Telehealth (HOSPITAL_COMMUNITY): Payer: Self-pay | Admitting: Internal Medicine

## 2018-11-23 NOTE — Telephone Encounter (Signed)
lvm for PT re:appt & COVID screening. ° °-[facility] °

## 2018-11-27 ENCOUNTER — Other Ambulatory Visit (HOSPITAL_COMMUNITY): Payer: BC Managed Care – PPO

## 2018-11-27 ENCOUNTER — Other Ambulatory Visit: Payer: Self-pay | Admitting: Internal Medicine

## 2018-11-27 DIAGNOSIS — M1000 Idiopathic gout, unspecified site: Secondary | ICD-10-CM

## 2018-12-21 ENCOUNTER — Other Ambulatory Visit (HOSPITAL_COMMUNITY): Payer: Self-pay

## 2018-12-21 ENCOUNTER — Other Ambulatory Visit: Payer: Self-pay

## 2018-12-21 ENCOUNTER — Ambulatory Visit (INDEPENDENT_AMBULATORY_CARE_PROVIDER_SITE_OTHER): Payer: BC Managed Care – PPO

## 2018-12-21 ENCOUNTER — Ambulatory Visit (HOSPITAL_COMMUNITY)
Admission: RE | Admit: 2018-12-21 | Discharge: 2018-12-21 | Disposition: A | Discharge status: Home / Self Care | Payer: BC Managed Care – PPO | Source: Ambulatory Visit | Attending: Internal Medicine | Admitting: Internal Medicine

## 2018-12-21 DIAGNOSIS — E538 Deficiency of other specified B group vitamins: Secondary | ICD-10-CM

## 2018-12-21 DIAGNOSIS — I5042 Chronic combined systolic (congestive) and diastolic (congestive) heart failure: Secondary | ICD-10-CM | POA: Diagnosis present

## 2018-12-21 LAB — BASIC METABOLIC PANEL
Anion gap: 10 (ref 5–15)
BUN: 24 mg/dL — ABNORMAL HIGH (ref 6–20)
CO2: 32 mmol/L (ref 22–32)
Calcium: 8.8 mg/dL — ABNORMAL LOW (ref 8.9–10.3)
Chloride: 101 mmol/L (ref 98–111)
Creatinine, Ser: 2.02 mg/dL — ABNORMAL HIGH (ref 0.61–1.24)
GFR calc Af Amer: 42 mL/min — ABNORMAL LOW (ref >60)
GFR calc non Af Amer: 36 mL/min — ABNORMAL LOW (ref >60)
Glucose, Bld: 107 mg/dL — ABNORMAL HIGH (ref 70–99)
Potassium: 4.2 mmol/L (ref 3.5–5.1)
Sodium: 143 mmol/L (ref 135–145)

## 2018-12-21 MED ORDER — CYANOCOBALAMIN 1000 MCG/ML IJ SOLN
1000.0000 ug | Freq: Once | INTRAMUSCULAR | Status: AC
  Administered 2018-12-21: 1000 ug via INTRAMUSCULAR

## 2018-12-21 MED ORDER — ATORVASTATIN CALCIUM 80 MG PO TABS: 80.0000 mg | ORAL_TABLET | Freq: Every day | ORAL | 1 refills | Status: DC

## 2018-12-21 NOTE — Progress Notes (Signed)
I have reviewed and agree.

## 2019-02-14 ENCOUNTER — Other Ambulatory Visit: Payer: Self-pay | Admitting: Internal Medicine

## 2019-02-14 DIAGNOSIS — M10332 Gout due to renal impairment, left wrist: Secondary | ICD-10-CM

## 2019-02-14 MED ORDER — COLCHICINE 0.6 MG PO CAPS: 1.0000 | ORAL_CAPSULE | Freq: Two times a day (BID) | ORAL | 1 refills | Status: AC

## 2019-03-14 ENCOUNTER — Ambulatory Visit (INDEPENDENT_AMBULATORY_CARE_PROVIDER_SITE_OTHER): Payer: BC Managed Care – PPO

## 2019-03-14 ENCOUNTER — Other Ambulatory Visit (HOSPITAL_COMMUNITY): Payer: Self-pay

## 2019-03-14 DIAGNOSIS — Z23 Encounter for immunization: Secondary | ICD-10-CM | POA: Diagnosis not present

## 2019-03-14 DIAGNOSIS — E538 Deficiency of other specified B group vitamins: Secondary | ICD-10-CM

## 2019-03-14 MED ORDER — ATORVASTATIN CALCIUM 80 MG PO TABS: 80.0000 mg | ORAL_TABLET | Freq: Every day | ORAL | 1 refills | Status: DC

## 2019-03-14 MED ORDER — CYANOCOBALAMIN 1000 MCG/ML IJ SOLN
1000.0000 ug | Freq: Once | INTRAMUSCULAR | Status: AC
  Administered 2019-03-14: 1000 ug via INTRAMUSCULAR

## 2019-03-14 NOTE — Progress Notes (Signed)
b12 Injection given.  Flu vaccine given  Binnie Rail, MD

## 2019-03-19 ENCOUNTER — Telehealth (HOSPITAL_COMMUNITY): Payer: Self-pay

## 2019-03-19 NOTE — Telephone Encounter (Signed)
Received a fax from [facility] stating that they have tried contacting the patient x3 times but have been unsuccessful in reaching him. They have closed the referral.    We must place another referral if we want to refer him back.   FYI

## 2019-04-14 ENCOUNTER — Inpatient Hospital Stay (HOSPITAL_COMMUNITY)
Admission: EM | Admit: 2019-04-14 | Discharge: 2019-05-25 | DRG: 023 | Disposition: E | Payer: BC Managed Care – PPO | Attending: Neurology | Admitting: Neurology

## 2019-04-14 ENCOUNTER — Emergency Department (HOSPITAL_COMMUNITY): Payer: BC Managed Care – PPO

## 2019-04-14 ENCOUNTER — Inpatient Hospital Stay (HOSPITAL_COMMUNITY): Payer: BC Managed Care – PPO

## 2019-04-14 ENCOUNTER — Emergency Department (HOSPITAL_COMMUNITY): Payer: BC Managed Care – PPO | Admitting: Anesthesiology

## 2019-04-14 ENCOUNTER — Other Ambulatory Visit (HOSPITAL_COMMUNITY): Payer: BC Managed Care – PPO

## 2019-04-14 ENCOUNTER — Encounter (HOSPITAL_COMMUNITY): Admission: EM | Disposition: E | Payer: Self-pay | Source: Home / Self Care | Attending: Neurology

## 2019-04-14 DIAGNOSIS — E1140 Type 2 diabetes mellitus with diabetic neuropathy, unspecified: Secondary | ICD-10-CM | POA: Diagnosis present

## 2019-04-14 DIAGNOSIS — M109 Gout, unspecified: Secondary | ICD-10-CM | POA: Diagnosis present

## 2019-04-14 DIAGNOSIS — G9340 Encephalopathy, unspecified: Secondary | ICD-10-CM | POA: Diagnosis present

## 2019-04-14 DIAGNOSIS — E1122 Type 2 diabetes mellitus with diabetic chronic kidney disease: Secondary | ICD-10-CM | POA: Diagnosis present

## 2019-04-14 DIAGNOSIS — G935 Compression of brain: Secondary | ICD-10-CM | POA: Diagnosis not present

## 2019-04-14 DIAGNOSIS — J9600 Acute respiratory failure, unspecified whether with hypoxia or hypercapnia: Secondary | ICD-10-CM

## 2019-04-14 DIAGNOSIS — E441 Mild protein-calorie malnutrition: Secondary | ICD-10-CM | POA: Diagnosis not present

## 2019-04-14 DIAGNOSIS — J9601 Acute respiratory failure with hypoxia: Secondary | ICD-10-CM | POA: Diagnosis not present

## 2019-04-14 DIAGNOSIS — R29810 Facial weakness: Secondary | ICD-10-CM | POA: Diagnosis present

## 2019-04-14 DIAGNOSIS — Z978 Presence of other specified devices: Secondary | ICD-10-CM | POA: Diagnosis not present

## 2019-04-14 DIAGNOSIS — T45515A Adverse effect of anticoagulants, initial encounter: Secondary | ICD-10-CM | POA: Diagnosis present

## 2019-04-14 DIAGNOSIS — Z87891 Personal history of nicotine dependence: Secondary | ICD-10-CM

## 2019-04-14 DIAGNOSIS — Z7901 Long term (current) use of anticoagulants: Secondary | ICD-10-CM

## 2019-04-14 DIAGNOSIS — Z7189 Other specified counseling: Secondary | ICD-10-CM | POA: Diagnosis not present

## 2019-04-14 DIAGNOSIS — J9690 Respiratory failure, unspecified, unspecified whether with hypoxia or hypercapnia: Secondary | ICD-10-CM

## 2019-04-14 DIAGNOSIS — I4811 Longstanding persistent atrial fibrillation: Secondary | ICD-10-CM | POA: Diagnosis not present

## 2019-04-14 DIAGNOSIS — E8770 Fluid overload, unspecified: Secondary | ICD-10-CM | POA: Diagnosis not present

## 2019-04-14 DIAGNOSIS — I4891 Unspecified atrial fibrillation: Secondary | ICD-10-CM | POA: Diagnosis not present

## 2019-04-14 DIAGNOSIS — A419 Sepsis, unspecified organism: Secondary | ICD-10-CM | POA: Diagnosis not present

## 2019-04-14 DIAGNOSIS — J690 Pneumonitis due to inhalation of food and vomit: Secondary | ICD-10-CM

## 2019-04-14 DIAGNOSIS — I10 Essential (primary) hypertension: Secondary | ICD-10-CM

## 2019-04-14 DIAGNOSIS — R29734 NIHSS score 34: Secondary | ICD-10-CM | POA: Diagnosis present

## 2019-04-14 DIAGNOSIS — G8191 Hemiplegia, unspecified affecting right dominant side: Secondary | ICD-10-CM | POA: Diagnosis present

## 2019-04-14 DIAGNOSIS — Z20828 Contact with and (suspected) exposure to other viral communicable diseases: Secondary | ICD-10-CM | POA: Diagnosis present

## 2019-04-14 DIAGNOSIS — R471 Dysarthria and anarthria: Secondary | ICD-10-CM | POA: Diagnosis present

## 2019-04-14 DIAGNOSIS — G936 Cerebral edema: Secondary | ICD-10-CM | POA: Diagnosis present

## 2019-04-14 DIAGNOSIS — I4821 Permanent atrial fibrillation: Secondary | ICD-10-CM | POA: Diagnosis present

## 2019-04-14 DIAGNOSIS — R0902 Hypoxemia: Secondary | ICD-10-CM

## 2019-04-14 DIAGNOSIS — I629 Nontraumatic intracranial hemorrhage, unspecified: Secondary | ICD-10-CM | POA: Diagnosis not present

## 2019-04-14 DIAGNOSIS — Z8249 Family history of ischemic heart disease and other diseases of the circulatory system: Secondary | ICD-10-CM

## 2019-04-14 DIAGNOSIS — Z800 Family history of malignant neoplasm of digestive organs: Secondary | ICD-10-CM

## 2019-04-14 DIAGNOSIS — J9621 Acute and chronic respiratory failure with hypoxia: Secondary | ICD-10-CM | POA: Diagnosis not present

## 2019-04-14 DIAGNOSIS — I6389 Other cerebral infarction: Secondary | ICD-10-CM | POA: Diagnosis not present

## 2019-04-14 DIAGNOSIS — I69191 Dysphagia following nontraumatic intracerebral hemorrhage: Secondary | ICD-10-CM

## 2019-04-14 DIAGNOSIS — N179 Acute kidney failure, unspecified: Secondary | ICD-10-CM | POA: Diagnosis not present

## 2019-04-14 DIAGNOSIS — Z515 Encounter for palliative care: Secondary | ICD-10-CM | POA: Diagnosis not present

## 2019-04-14 DIAGNOSIS — D72829 Elevated white blood cell count, unspecified: Secondary | ICD-10-CM | POA: Diagnosis not present

## 2019-04-14 DIAGNOSIS — I130 Hypertensive heart and chronic kidney disease with heart failure and stage 1 through stage 4 chronic kidney disease, or unspecified chronic kidney disease: Secondary | ICD-10-CM | POA: Diagnosis present

## 2019-04-14 DIAGNOSIS — I161 Hypertensive emergency: Secondary | ICD-10-CM | POA: Diagnosis present

## 2019-04-14 DIAGNOSIS — D6832 Hemorrhagic disorder due to extrinsic circulating anticoagulants: Secondary | ICD-10-CM | POA: Diagnosis present

## 2019-04-14 DIAGNOSIS — N471 Phimosis: Secondary | ICD-10-CM | POA: Diagnosis present

## 2019-04-14 DIAGNOSIS — I959 Hypotension, unspecified: Secondary | ICD-10-CM | POA: Diagnosis not present

## 2019-04-14 DIAGNOSIS — E870 Hyperosmolality and hypernatremia: Secondary | ICD-10-CM | POA: Diagnosis not present

## 2019-04-14 DIAGNOSIS — D649 Anemia, unspecified: Secondary | ICD-10-CM | POA: Diagnosis present

## 2019-04-14 DIAGNOSIS — Z6841 Body Mass Index (BMI) 40.0 and over, adult: Secondary | ICD-10-CM

## 2019-04-14 DIAGNOSIS — I712 Thoracic aortic aneurysm, without rupture: Secondary | ICD-10-CM | POA: Diagnosis present

## 2019-04-14 DIAGNOSIS — I619 Nontraumatic intracerebral hemorrhage, unspecified: Secondary | ICD-10-CM | POA: Diagnosis present

## 2019-04-14 DIAGNOSIS — Z66 Do not resuscitate: Secondary | ICD-10-CM | POA: Diagnosis not present

## 2019-04-14 DIAGNOSIS — G4733 Obstructive sleep apnea (adult) (pediatric): Secondary | ICD-10-CM | POA: Diagnosis present

## 2019-04-14 DIAGNOSIS — R569 Unspecified convulsions: Secondary | ICD-10-CM | POA: Diagnosis present

## 2019-04-14 DIAGNOSIS — Z9989 Dependence on other enabling machines and devices: Secondary | ICD-10-CM | POA: Diagnosis not present

## 2019-04-14 DIAGNOSIS — L89316 Pressure-induced deep tissue damage of right buttock: Secondary | ICD-10-CM | POA: Diagnosis not present

## 2019-04-14 DIAGNOSIS — J440 Chronic obstructive pulmonary disease with acute lower respiratory infection: Secondary | ICD-10-CM | POA: Diagnosis not present

## 2019-04-14 DIAGNOSIS — R400 Somnolence: Secondary | ICD-10-CM

## 2019-04-14 DIAGNOSIS — D689 Coagulation defect, unspecified: Secondary | ICD-10-CM | POA: Diagnosis present

## 2019-04-14 DIAGNOSIS — E876 Hypokalemia: Secondary | ICD-10-CM | POA: Diagnosis not present

## 2019-04-14 DIAGNOSIS — L89322 Pressure ulcer of left buttock, stage 2: Secondary | ICD-10-CM | POA: Diagnosis not present

## 2019-04-14 DIAGNOSIS — R4701 Aphasia: Secondary | ICD-10-CM | POA: Diagnosis present

## 2019-04-14 DIAGNOSIS — I611 Nontraumatic intracerebral hemorrhage in hemisphere, cortical: Secondary | ICD-10-CM | POA: Diagnosis present

## 2019-04-14 DIAGNOSIS — I5042 Chronic combined systolic (congestive) and diastolic (congestive) heart failure: Secondary | ICD-10-CM | POA: Diagnosis present

## 2019-04-14 DIAGNOSIS — E785 Hyperlipidemia, unspecified: Secondary | ICD-10-CM | POA: Diagnosis present

## 2019-04-14 DIAGNOSIS — E46 Unspecified protein-calorie malnutrition: Secondary | ICD-10-CM | POA: Diagnosis present

## 2019-04-14 DIAGNOSIS — R197 Diarrhea, unspecified: Secondary | ICD-10-CM | POA: Diagnosis present

## 2019-04-14 DIAGNOSIS — J80 Acute respiratory distress syndrome: Secondary | ICD-10-CM | POA: Diagnosis not present

## 2019-04-14 DIAGNOSIS — N184 Chronic kidney disease, stage 4 (severe): Secondary | ICD-10-CM | POA: Diagnosis present

## 2019-04-14 DIAGNOSIS — Z79899 Other long term (current) drug therapy: Secondary | ICD-10-CM

## 2019-04-14 DIAGNOSIS — Z9289 Personal history of other medical treatment: Secondary | ICD-10-CM

## 2019-04-14 DIAGNOSIS — Z0189 Encounter for other specified special examinations: Secondary | ICD-10-CM

## 2019-04-14 DIAGNOSIS — I420 Dilated cardiomyopathy: Secondary | ICD-10-CM | POA: Diagnosis present

## 2019-04-14 DIAGNOSIS — R1312 Dysphagia, oropharyngeal phase: Secondary | ICD-10-CM | POA: Diagnosis not present

## 2019-04-14 DIAGNOSIS — I480 Paroxysmal atrial fibrillation: Secondary | ICD-10-CM | POA: Diagnosis present

## 2019-04-14 DIAGNOSIS — R509 Fever, unspecified: Secondary | ICD-10-CM | POA: Diagnosis not present

## 2019-04-14 DIAGNOSIS — L8990 Pressure ulcer of unspecified site, unspecified stage: Secondary | ICD-10-CM | POA: Diagnosis present

## 2019-04-14 DIAGNOSIS — R1310 Dysphagia, unspecified: Secondary | ICD-10-CM | POA: Diagnosis present

## 2019-04-14 DIAGNOSIS — Z8042 Family history of malignant neoplasm of prostate: Secondary | ICD-10-CM

## 2019-04-14 DIAGNOSIS — J9691 Respiratory failure, unspecified with hypoxia: Secondary | ICD-10-CM

## 2019-04-14 HISTORY — PX: CRANIOTOMY: SHX93

## 2019-04-14 LAB — POCT I-STAT 7, (LYTES, BLD GAS, ICA,H+H)
Acid-Base Excess: 5 mmol/L — ABNORMAL HIGH (ref 0.0–2.0)
Acid-Base Excess: 3 mmol/L — ABNORMAL HIGH (ref 0.0–2.0)
Bicarbonate: 31.9 mmol/L — ABNORMAL HIGH (ref 20.0–28.0)
Bicarbonate: 28.2 mmol/L — ABNORMAL HIGH (ref 20.0–28.0)
Calcium, Ion: 1.14 mmol/L — ABNORMAL LOW (ref 1.15–1.40)
Calcium, Ion: 1.16 mmol/L (ref 1.15–1.40)
HCT: 37 % — ABNORMAL LOW (ref 39.0–52.0)
HCT: 36 % — ABNORMAL LOW (ref 39.0–52.0)
Hemoglobin: 12.6 g/dL — ABNORMAL LOW (ref 13.0–17.0)
Hemoglobin: 12.2 g/dL — ABNORMAL LOW (ref 13.0–17.0)
O2 Saturation: 93 %
O2 Saturation: 96 %
Patient temperature: 98.6
Potassium: 3.6 mmol/L (ref 3.5–5.1)
Potassium: 4 mmol/L (ref 3.5–5.1)
Sodium: 139 mmol/L (ref 135–145)
Sodium: 141 mmol/L (ref 135–145)
TCO2: 34 mmol/L — ABNORMAL HIGH (ref 22–32)
TCO2: 29 mmol/L (ref 22–32)
pCO2 arterial: 55.7 mmHg — ABNORMAL HIGH (ref 32.0–48.0)
pCO2 arterial: 42.5 mmHg (ref 32.0–48.0)
pH, Arterial: 7.366 (ref 7.350–7.450)
pH, Arterial: 7.43 (ref 7.350–7.450)
pO2, Arterial: 73 mmHg — ABNORMAL LOW (ref 83.0–108.0)
pO2, Arterial: 78 mmHg — ABNORMAL LOW (ref 83.0–108.0)

## 2019-04-14 LAB — BASIC METABOLIC PANEL
Anion gap: 6 (ref 5–15)
BUN: 28 mg/dL — ABNORMAL HIGH (ref 6–20)
CO2: 28 mmol/L (ref 22–32)
Calcium: 8.5 mg/dL — ABNORMAL LOW (ref 8.9–10.3)
Chloride: 106 mmol/L (ref 98–111)
Creatinine, Ser: 1.73 mg/dL — ABNORMAL HIGH (ref 0.61–1.24)
GFR calc Af Amer: 50 mL/min — ABNORMAL LOW (ref >60)
GFR calc non Af Amer: 43 mL/min — ABNORMAL LOW (ref >60)
Glucose, Bld: 152 mg/dL — ABNORMAL HIGH (ref 70–99)
Potassium: 4 mmol/L (ref 3.5–5.1)
Sodium: 140 mmol/L (ref 135–145)

## 2019-04-14 LAB — DIFFERENTIAL
Abs Immature Granulocytes: 0.01 10*3/uL (ref 0.00–0.07)
Basophils Absolute: 0.1 10*3/uL (ref 0.0–0.1)
Basophils Relative: 1 %
Eosinophils Absolute: 0.1 10*3/uL (ref 0.0–0.5)
Eosinophils Relative: 3 %
Immature Granulocytes: 0 %
Lymphocytes Relative: 32 %
Lymphs Abs: 1.5 10*3/uL (ref 0.7–4.0)
Monocytes Absolute: 0.6 10*3/uL (ref 0.1–1.0)
Monocytes Relative: 14 %
Neutro Abs: 2.4 10*3/uL (ref 1.7–7.7)
Neutrophils Relative %: 50 %

## 2019-04-14 LAB — ECHOCARDIOGRAM COMPLETE: Height: 71 in

## 2019-04-14 LAB — I-STAT CHEM 8, ED
BUN: 30 mg/dL — ABNORMAL HIGH (ref 6–20)
Calcium, Ion: 1.09 mmol/L — ABNORMAL LOW (ref 1.15–1.40)
Chloride: 102 mmol/L (ref 98–111)
Creatinine, Ser: 1.7 mg/dL — ABNORMAL HIGH (ref 0.61–1.24)
Glucose, Bld: 126 mg/dL — ABNORMAL HIGH (ref 70–99)
HCT: 43 % (ref 39.0–52.0)
Hemoglobin: 14.6 g/dL (ref 13.0–17.0)
Potassium: 3.9 mmol/L (ref 3.5–5.1)
Sodium: 143 mmol/L (ref 135–145)
TCO2: 30 mmol/L (ref 22–32)

## 2019-04-14 LAB — CBC
HCT: 43.6 % (ref 39.0–52.0)
HCT: 38.6 % — ABNORMAL LOW (ref 39.0–52.0)
Hemoglobin: 13.8 g/dL (ref 13.0–17.0)
Hemoglobin: 12.8 g/dL — ABNORMAL LOW (ref 13.0–17.0)
MCH: 30.2 pg (ref 26.0–34.0)
MCH: 31 pg (ref 26.0–34.0)
MCHC: 31.7 g/dL (ref 30.0–36.0)
MCHC: 33.2 g/dL (ref 30.0–36.0)
MCV: 95.4 fL (ref 80.0–100.0)
MCV: 93.5 fL (ref 80.0–100.0)
Platelets: 228 10*3/uL (ref 150–400)
Platelets: 190 10*3/uL (ref 150–400)
RBC: 4.57 MIL/uL (ref 4.22–5.81)
RBC: 4.13 MIL/uL — ABNORMAL LOW (ref 4.22–5.81)
RDW: 14 % (ref 11.5–15.5)
RDW: 13.8 % (ref 11.5–15.5)
WBC: 4.7 10*3/uL (ref 4.0–10.5)
WBC: 6.4 10*3/uL (ref 4.0–10.5)
nRBC: 0 % (ref 0.0–0.2)
nRBC: 0 % (ref 0.0–0.2)

## 2019-04-14 LAB — HIV ANTIBODY (ROUTINE TESTING W REFLEX): HIV Screen 4th Generation wRfx: NONREACTIVE

## 2019-04-14 LAB — PREPARE RBC (CROSSMATCH)

## 2019-04-14 LAB — COMPREHENSIVE METABOLIC PANEL
ALT: 21 U/L (ref 0–44)
AST: 21 U/L (ref 15–41)
Albumin: 3.3 g/dL — ABNORMAL LOW (ref 3.5–5.0)
Alkaline Phosphatase: 89 U/L (ref 38–126)
Anion gap: 8 (ref 5–15)
BUN: 27 mg/dL — ABNORMAL HIGH (ref 6–20)
CO2: 29 mmol/L (ref 22–32)
Calcium: 8.7 mg/dL — ABNORMAL LOW (ref 8.9–10.3)
Chloride: 104 mmol/L (ref 98–111)
Creatinine, Ser: 1.72 mg/dL — ABNORMAL HIGH (ref 0.61–1.24)
GFR calc Af Amer: 51 mL/min — ABNORMAL LOW (ref >60)
GFR calc non Af Amer: 44 mL/min — ABNORMAL LOW (ref >60)
Glucose, Bld: 134 mg/dL — ABNORMAL HIGH (ref 70–99)
Potassium: 3.9 mmol/L (ref 3.5–5.1)
Sodium: 141 mmol/L (ref 135–145)
Total Bilirubin: 0.7 mg/dL (ref 0.3–1.2)
Total Protein: 6.4 g/dL — ABNORMAL LOW (ref 6.5–8.1)

## 2019-04-14 LAB — GLUCOSE, CAPILLARY
Glucose-Capillary: 152 mg/dL — ABNORMAL HIGH (ref 70–99)
Glucose-Capillary: 141 mg/dL — ABNORMAL HIGH (ref 70–99)
Glucose-Capillary: 141 mg/dL — ABNORMAL HIGH (ref 70–99)

## 2019-04-14 LAB — MRSA PCR SCREENING: MRSA by PCR: NEGATIVE

## 2019-04-14 LAB — CBG MONITORING, ED: Glucose-Capillary: 118 mg/dL — ABNORMAL HIGH (ref 70–99)

## 2019-04-14 LAB — PROTIME-INR
INR: 1.8 — ABNORMAL HIGH (ref 0.8–1.2)
Prothrombin Time: 20.4 seconds — ABNORMAL HIGH (ref 11.4–15.2)

## 2019-04-14 LAB — HEMOGLOBIN A1C
Hgb A1c MFr Bld: 5.8 % — ABNORMAL HIGH (ref 4.8–5.6)
Mean Plasma Glucose: 119.76 mg/dL

## 2019-04-14 LAB — APTT: aPTT: 37 seconds — ABNORMAL HIGH (ref 24–36)

## 2019-04-14 LAB — POC SARS CORONAVIRUS 2 AG -  ED: SARS Coronavirus 2 Ag: NEGATIVE

## 2019-04-14 LAB — ABO/RH: ABO/RH(D): A POS

## 2019-04-14 SURGERY — CRANIOTOMY HEMATOMA EVACUATION SUBDURAL
Anesthesia: General | Site: Head | Laterality: Left

## 2019-04-14 MED ORDER — ALBUTEROL SULFATE (2.5 MG/3ML) 0.083% IN NEBU: 2.5000 mg | INHALATION_SOLUTION | RESPIRATORY_TRACT | Status: DC | PRN

## 2019-04-14 MED ORDER — DEXTROSE 5 % IV SOLN
INTRAVENOUS | Status: DC | PRN
  Administered 2019-04-14: 3 g via INTRAVENOUS

## 2019-04-14 MED ORDER — ACETAMINOPHEN 650 MG RE SUPP: 650.0000 mg | RECTAL | Status: DC | PRN

## 2019-04-14 MED ORDER — ACETAMINOPHEN 325 MG PO TABS
650.0000 mg | ORAL_TABLET | ORAL | Status: DC | PRN
  Administered 2019-04-17: 650 mg via ORAL
  Filled 2019-04-14: qty 2

## 2019-04-14 MED ORDER — ORAL CARE MOUTH RINSE: 15.0000 mL | OROMUCOSAL | Status: DC

## 2019-04-14 MED ORDER — LIDOCAINE-EPINEPHRINE 0.5 %-1:200000 IJ SOLN
INTRAMUSCULAR | Status: DC | PRN
  Administered 2019-04-14: 10 mL via INTRADERMAL

## 2019-04-14 MED ORDER — SODIUM CHLORIDE 0.9 % IV SOLN: 2000.0000 mg | Freq: Once | INTRAVENOUS | Status: DC

## 2019-04-14 MED ORDER — BISACODYL 10 MG RE SUPP: 10.0000 mg | Freq: Every day | RECTAL | Status: DC | PRN

## 2019-04-14 MED ORDER — LABETALOL HCL 5 MG/ML IV SOLN
INTRAVENOUS | Status: AC
  Filled 2019-04-14: qty 4

## 2019-04-14 MED ORDER — CHLORHEXIDINE GLUCONATE 0.12% ORAL RINSE (MEDLINE KIT)
15.0000 mL | Freq: Two times a day (BID) | OROMUCOSAL | Status: DC
  Administered 2019-04-14 – 2019-04-15 (×2): 15 mL via OROMUCOSAL

## 2019-04-14 MED ORDER — ONDANSETRON HCL 4 MG/2ML IJ SOLN
INTRAMUSCULAR | Status: DC | PRN
  Administered 2019-04-14: 4 mg via INTRAVENOUS

## 2019-04-14 MED ORDER — STROKE: EARLY STAGES OF RECOVERY BOOK
Freq: Once | Status: AC
  Administered 2019-04-14: 16:00:00
  Filled 2019-04-14: qty 1

## 2019-04-14 MED ORDER — THROMBIN 5000 UNITS EX SOLR
OROMUCOSAL | Status: DC | PRN
  Administered 2019-04-14: 5 mL via TOPICAL

## 2019-04-14 MED ORDER — LEVETIRACETAM IN NACL 1000 MG/100ML IV SOLN
1000.0000 mg | Freq: Once | INTRAVENOUS | Status: DC
  Filled 2019-04-14: qty 100

## 2019-04-14 MED ORDER — INSULIN ASPART 100 UNIT/ML ~~LOC~~ SOLN
0.0000 [IU] | SUBCUTANEOUS | Status: DC
  Administered 2019-04-14: 4 [IU] via SUBCUTANEOUS
  Administered 2019-04-14 – 2019-04-21 (×27): 3 [IU] via SUBCUTANEOUS
  Administered 2019-04-21: 2 [IU] via SUBCUTANEOUS
  Administered 2019-04-21: 3 [IU] via SUBCUTANEOUS
  Administered 2019-04-21: 4 [IU] via SUBCUTANEOUS
  Administered 2019-04-22 (×4): 3 [IU] via SUBCUTANEOUS
  Administered 2019-04-23: 4 [IU] via SUBCUTANEOUS
  Administered 2019-04-23: 3 [IU] via SUBCUTANEOUS
  Administered 2019-04-23: 4 [IU] via SUBCUTANEOUS
  Administered 2019-04-23 (×3): 3 [IU] via SUBCUTANEOUS
  Administered 2019-04-24 (×2): 4 [IU] via SUBCUTANEOUS

## 2019-04-14 MED ORDER — PANTOPRAZOLE SODIUM 40 MG IV SOLR
40.0000 mg | Freq: Every day | INTRAVENOUS | Status: DC
  Administered 2019-04-14 – 2019-04-15 (×2): 40 mg via INTRAVENOUS
  Filled 2019-04-14 (×2): qty 40

## 2019-04-14 MED ORDER — FENTANYL CITRATE (PF) 250 MCG/5ML IJ SOLN
INTRAMUSCULAR | Status: AC
  Filled 2019-04-14: qty 5

## 2019-04-14 MED ORDER — SODIUM CHLORIDE 0.9 % IV SOLN
INTRAVENOUS | Status: DC | PRN
  Administered 2019-04-14: 10:00:00 via INTRAVENOUS

## 2019-04-14 MED ORDER — SODIUM CHLORIDE 0.9% FLUSH
3.0000 mL | Freq: Once | INTRAVENOUS | Status: AC
Start: 2019-04-14 — End: 2019-04-16
  Administered 2019-04-16: 3 mL via INTRAVENOUS

## 2019-04-14 MED ORDER — FENTANYL BOLUS VIA INFUSION
50.0000 ug | INTRAVENOUS | Status: DC | PRN
  Administered 2019-04-21: 50 ug via INTRAVENOUS
  Filled 2019-04-14: qty 50

## 2019-04-14 MED ORDER — SENNOSIDES-DOCUSATE SODIUM 8.6-50 MG PO TABS
1.0000 | ORAL_TABLET | Freq: Two times a day (BID) | ORAL | Status: DC
  Administered 2019-04-15 – 2019-04-16 (×3): 1 via ORAL
  Filled 2019-04-14 (×3): qty 1

## 2019-04-14 MED ORDER — PROPOFOL 10 MG/ML IV BOLUS
INTRAVENOUS | Status: AC
  Filled 2019-04-14: qty 20

## 2019-04-14 MED ORDER — BACITRACIN ZINC 500 UNIT/GM EX OINT
TOPICAL_OINTMENT | CUTANEOUS | Status: AC
  Filled 2019-04-14: qty 28.35

## 2019-04-14 MED ORDER — EMPTY CONTAINERS FLEXIBLE MISC
900.0000 mg | Freq: Once | Status: AC
  Administered 2019-04-14: 900 mg via INTRAVENOUS
  Filled 2019-04-14: qty 90

## 2019-04-14 MED ORDER — THROMBIN 20000 UNITS EX KIT
PACK | CUTANEOUS | Status: AC
  Filled 2019-04-14: qty 1

## 2019-04-14 MED ORDER — ORAL CARE MOUTH RINSE
15.0000 mL | OROMUCOSAL | Status: DC
  Administered 2019-04-14 – 2019-04-24 (×96): 15 mL via OROMUCOSAL

## 2019-04-14 MED ORDER — DEXAMETHASONE SODIUM PHOSPHATE 10 MG/ML IJ SOLN
INTRAMUSCULAR | Status: DC | PRN
  Administered 2019-04-14: 10 mg via INTRAVENOUS

## 2019-04-14 MED ORDER — LORAZEPAM BOLUS VIA INFUSION: 2.0000 mg | INTRAVENOUS | Status: DC

## 2019-04-14 MED ORDER — THROMBIN 5000 UNITS EX SOLR
CUTANEOUS | Status: AC
  Filled 2019-04-14: qty 5000

## 2019-04-14 MED ORDER — FENTANYL 2500MCG IN NS 250ML (10MCG/ML) PREMIX INFUSION
50.0000 ug/h | INTRAVENOUS | Status: DC
  Administered 2019-04-16 – 2019-04-21 (×3): 50 ug/h via INTRAVENOUS
  Filled 2019-04-14 (×3): qty 250

## 2019-04-14 MED ORDER — CHLORHEXIDINE GLUCONATE CLOTH 2 % EX PADS
6.0000 | MEDICATED_PAD | Freq: Every day | CUTANEOUS | Status: DC
  Administered 2019-04-14 – 2019-04-23 (×9): 6 via TOPICAL

## 2019-04-14 MED ORDER — ROCURONIUM BROMIDE 10 MG/ML (PF) SYRINGE
PREFILLED_SYRINGE | INTRAVENOUS | Status: DC | PRN
  Administered 2019-04-14 (×4): 50 mg via INTRAVENOUS

## 2019-04-14 MED ORDER — THROMBIN 20000 UNITS EX SOLR
CUTANEOUS | Status: DC | PRN
  Administered 2019-04-14: 11:00:00 20 mL via TOPICAL

## 2019-04-14 MED ORDER — IOHEXOL 350 MG/ML SOLN
50.0000 mL | Freq: Once | INTRAVENOUS | Status: AC | PRN
  Administered 2019-04-14: 50 mL via INTRAVENOUS

## 2019-04-14 MED ORDER — SODIUM CHLORIDE 0.9 % IV SOLN
10.0000 mL/h | Freq: Once | INTRAVENOUS | Status: AC
  Administered 2019-04-14: 10 mL/h via INTRAVENOUS

## 2019-04-14 MED ORDER — CLEVIDIPINE BUTYRATE 0.5 MG/ML IV EMUL
INTRAVENOUS | Status: AC
  Filled 2019-04-14: qty 50

## 2019-04-14 MED ORDER — PROPOFOL 10 MG/ML IV BOLUS
INTRAVENOUS | Status: DC | PRN
  Administered 2019-04-14: 30 mg via INTRAVENOUS

## 2019-04-14 MED ORDER — SODIUM CHLORIDE 0.9 % IV SOLN
INTRAVENOUS | Status: DC | PRN
  Administered 2019-04-14: 11:00:00 via INTRAVENOUS

## 2019-04-14 MED ORDER — ALLOPURINOL 100 MG PO TABS
100.0000 mg | ORAL_TABLET | Freq: Every day | ORAL | Status: DC
  Administered 2019-04-15 – 2019-04-16 (×2): 100 mg via ORAL
  Filled 2019-04-14 (×2): qty 1

## 2019-04-14 MED ORDER — PHENYLEPHRINE HCL-NACL 10-0.9 MG/250ML-% IV SOLN
INTRAVENOUS | Status: DC | PRN
  Administered 2019-04-14: 10 ug/min via INTRAVENOUS

## 2019-04-14 MED ORDER — CLEVIDIPINE BUTYRATE 0.5 MG/ML IV EMUL
0.0000 mg/h | INTRAVENOUS | Status: DC
  Administered 2019-04-14: 1 mg/h via INTRAVENOUS
  Administered 2019-04-14: 0 mg/h via INTRAVENOUS
  Administered 2019-04-15 (×2): 12 mg/h via INTRAVENOUS
  Administered 2019-04-15: 5 mg/h via INTRAVENOUS
  Administered 2019-04-15: 10 mg/h via INTRAVENOUS
  Administered 2019-04-15: 12 mg/h via INTRAVENOUS
  Administered 2019-04-15: 16 mg/h via INTRAVENOUS
  Administered 2019-04-15: 14 mg/h via INTRAVENOUS
  Administered 2019-04-15: 16 mg/h via INTRAVENOUS
  Administered 2019-04-16: 7 mg/h via INTRAVENOUS
  Filled 2019-04-14 (×2): qty 50
  Filled 2019-04-14: qty 100
  Filled 2019-04-14: qty 50
  Filled 2019-04-14 (×3): qty 100
  Filled 2019-04-14: qty 50

## 2019-04-14 MED ORDER — MANNITOL 25 % IV SOLN: 25.0000 g | Freq: Once | INTRAVENOUS | Status: DC

## 2019-04-14 MED ORDER — BACITRACIN ZINC 500 UNIT/GM EX OINT
TOPICAL_OINTMENT | CUTANEOUS | Status: DC | PRN
  Administered 2019-04-14: 1 via TOPICAL

## 2019-04-14 MED ORDER — FENTANYL CITRATE (PF) 100 MCG/2ML IJ SOLN: 50.0000 ug | Freq: Once | INTRAMUSCULAR | Status: DC

## 2019-04-14 MED ORDER — ACETAMINOPHEN 160 MG/5ML PO SOLN
650.0000 mg | ORAL | Status: DC | PRN
  Administered 2019-04-16 – 2019-04-20 (×7): 650 mg
  Filled 2019-04-14 (×6): qty 20.3

## 2019-04-14 MED ORDER — PROPOFOL 1000 MG/100ML IV EMUL
INTRAVENOUS | Status: AC
  Filled 2019-04-14: qty 100

## 2019-04-14 MED ORDER — LIDOCAINE-EPINEPHRINE 0.5 %-1:200000 IJ SOLN
INTRAMUSCULAR | Status: AC
  Filled 2019-04-14: qty 1

## 2019-04-14 MED ORDER — AMIODARONE HCL 200 MG PO TABS
200.0000 mg | ORAL_TABLET | Freq: Every day | ORAL | Status: DC
  Administered 2019-04-15 – 2019-04-16 (×2): 200 mg via ORAL
  Filled 2019-04-14 (×2): qty 1

## 2019-04-14 MED ORDER — FENTANYL CITRATE (PF) 250 MCG/5ML IJ SOLN
INTRAMUSCULAR | Status: DC | PRN
  Administered 2019-04-14: 100 ug via INTRAVENOUS
  Administered 2019-04-14: 50 ug via INTRAVENOUS
  Administered 2019-04-14: 100 ug via INTRAVENOUS

## 2019-04-14 MED ORDER — 0.9 % SODIUM CHLORIDE (POUR BTL) OPTIME
TOPICAL | Status: DC | PRN
  Administered 2019-04-14 (×2): 1000 mL

## 2019-04-14 MED ORDER — PROPOFOL 1000 MG/100ML IV EMUL
0.0000 ug/kg/min | INTRAVENOUS | Status: DC
  Administered 2019-04-14 (×7): 50 ug/kg/min via INTRAVENOUS
  Administered 2019-04-15: 24 ug/kg/min via INTRAVENOUS
  Administered 2019-04-15: 25 ug/kg/min via INTRAVENOUS
  Administered 2019-04-15: 25.05 ug/kg/min via INTRAVENOUS
  Administered 2019-04-15: 25 ug/kg/min via INTRAVENOUS
  Administered 2019-04-15: 25.048 ug/kg/min via INTRAVENOUS
  Administered 2019-04-15 (×3): 50 ug/kg/min via INTRAVENOUS
  Administered 2019-04-16: 18 ug/kg/min via INTRAVENOUS
  Administered 2019-04-16: 25 ug/kg/min via INTRAVENOUS
  Administered 2019-04-16 (×2): 15 ug/kg/min via INTRAVENOUS
  Administered 2019-04-16: 10 ug/kg/min via INTRAVENOUS
  Administered 2019-04-17: 15 ug/kg/min via INTRAVENOUS
  Administered 2019-04-17 – 2019-04-18 (×3): 7.5 ug/kg/min via INTRAVENOUS
  Administered 2019-04-19 (×2): 5 ug/kg/min via INTRAVENOUS
  Administered 2019-04-20 – 2019-04-22 (×5): 10 ug/kg/min via INTRAVENOUS
  Filled 2019-04-14: qty 100
  Filled 2019-04-14: qty 200
  Filled 2019-04-14: qty 100
  Filled 2019-04-14: qty 200
  Filled 2019-04-14 (×8): qty 100
  Filled 2019-04-14 (×2): qty 200
  Filled 2019-04-14 (×3): qty 100
  Filled 2019-04-14: qty 200
  Filled 2019-04-14 (×6): qty 100
  Filled 2019-04-14 (×2): qty 200
  Filled 2019-04-14: qty 100

## 2019-04-14 MED ORDER — DOCUSATE SODIUM 50 MG/5ML PO LIQD: 100.0000 mg | Freq: Two times a day (BID) | ORAL | Status: DC | PRN

## 2019-04-14 MED ORDER — MICROFIBRILLAR COLL HEMOSTAT EX PADS
MEDICATED_PAD | CUTANEOUS | Status: DC | PRN
  Administered 2019-04-14: 1 via TOPICAL

## 2019-04-14 MED ORDER — LEVETIRACETAM IN NACL 1000 MG/100ML IV SOLN
1000.0000 mg | Freq: Two times a day (BID) | INTRAVENOUS | Status: DC
  Administered 2019-04-14 – 2019-04-23 (×19): 1000 mg via INTRAVENOUS
  Filled 2019-04-14 (×19): qty 100

## 2019-04-14 MED ORDER — CHLORHEXIDINE GLUCONATE 0.12% ORAL RINSE (MEDLINE KIT)
15.0000 mL | Freq: Two times a day (BID) | OROMUCOSAL | Status: DC
  Administered 2019-04-15 – 2019-04-23 (×17): 15 mL via OROMUCOSAL

## 2019-04-14 MED ORDER — MANNITOL 25 % IV SOLN
25.0000 g | Freq: Once | Status: AC
  Administered 2019-04-14: 25 g via INTRAVENOUS
  Filled 2019-04-14: qty 100

## 2019-04-14 MED ORDER — LORAZEPAM 2 MG/ML IJ SOLN
2.0000 mg | INTRAMUSCULAR | Status: AC
  Administered 2019-04-14: 2 mg via INTRAVENOUS
  Filled 2019-04-14: qty 1

## 2019-04-14 SURGICAL SUPPLY — 69 items
APL SKNCLS STERI-STRIP NONHPOA (GAUZE/BANDAGES/DRESSINGS)
BENZOIN TINCTURE PRP APPL 2/3 (GAUZE/BANDAGES/DRESSINGS) IMPLANT
BLADE CLIPPER SURG (BLADE) ×3 IMPLANT
BLADE ULTRA TIP 2M (BLADE) ×3 IMPLANT
BNDG GAUZE ELAST 4 BULKY (GAUZE/BANDAGES/DRESSINGS) IMPLANT
BUR ACORN 6.0 PRECISION (BURR) ×2 IMPLANT
BUR ACORN 6.0MM PRECISION (BURR) ×1
BUR MATCHSTICK NEURO 3.0 LAGG (BURR) IMPLANT
BUR SPIRAL ROUTER 2.3 (BUR) IMPLANT
BUR SPIRAL ROUTER 2.3MM (BUR)
CANISTER SUCT 3000ML PPV (MISCELLANEOUS) ×3 IMPLANT
CARTRIDGE OIL MAESTRO DRILL (MISCELLANEOUS) ×1 IMPLANT
CLIP VESOCCLUDE MED 6/CT (CLIP) IMPLANT
COVER WAND RF STERILE (DRAPES) ×3 IMPLANT
DIFFUSER DRILL AIR PNEUMATIC (MISCELLANEOUS) ×3 IMPLANT
DRAPE NEUROLOGICAL W/INCISE (DRAPES) ×3 IMPLANT
DRAPE SURG 17X23 STRL (DRAPES) IMPLANT
DRAPE WARM FLUID 44X44 (DRAPES) ×3 IMPLANT
DRSG TELFA 3X8 NADH (GAUZE/BANDAGES/DRESSINGS) ×3 IMPLANT
DURAPREP 6ML APPLICATOR 50/CS (WOUND CARE) ×3 IMPLANT
ELECT REM PT RETURN 9FT ADLT (ELECTROSURGICAL) ×3
ELECTRODE REM PT RTRN 9FT ADLT (ELECTROSURGICAL) ×1 IMPLANT
EVACUATOR 1/8 PVC DRAIN (DRAIN) IMPLANT
EVACUATOR SILICONE 100CC (DRAIN) IMPLANT
GAUZE 4X4 16PLY RFD (DISPOSABLE) IMPLANT
GAUZE SPONGE 4X4 12PLY STRL (GAUZE/BANDAGES/DRESSINGS) ×5 IMPLANT
GLOVE BIO SURGEON STRL SZ 6.5 (GLOVE) ×1 IMPLANT
GLOVE BIO SURGEON STRL SZ7 (GLOVE) ×2 IMPLANT
GLOVE BIO SURGEON STRL SZ7.5 (GLOVE) ×2 IMPLANT
GLOVE BIO SURGEONS STRL SZ 6.5 (GLOVE) ×1
GLOVE ECLIPSE 6.5 STRL STRAW (GLOVE) ×3 IMPLANT
GLOVE EXAM NITRILE XL STR (GLOVE) IMPLANT
GOWN STRL REUS W/ TWL LRG LVL3 (GOWN DISPOSABLE) ×2 IMPLANT
GOWN STRL REUS W/ TWL XL LVL3 (GOWN DISPOSABLE) IMPLANT
GOWN STRL REUS W/TWL 2XL LVL3 (GOWN DISPOSABLE) IMPLANT
GOWN STRL REUS W/TWL LRG LVL3 (GOWN DISPOSABLE) ×6
GOWN STRL REUS W/TWL XL LVL3 (GOWN DISPOSABLE)
GRAFT DURAGEN MATRIX 2WX2L ×2 IMPLANT
HEMOSTAT SURGICEL 2X14 (HEMOSTASIS) IMPLANT
KIT BASIN OR (CUSTOM PROCEDURE TRAY) ×3 IMPLANT
KIT TURNOVER KIT B (KITS) ×3 IMPLANT
NDL HYPO 25X1 1.5 SAFETY (NEEDLE) ×1 IMPLANT
NEEDLE HYPO 25X1 1.5 SAFETY (NEEDLE) ×3 IMPLANT
NS IRRIG 1000ML POUR BTL (IV SOLUTION) ×9 IMPLANT
OIL CARTRIDGE MAESTRO DRILL (MISCELLANEOUS) ×3
PACK CRANIOTOMY CUSTOM (CUSTOM PROCEDURE TRAY) ×3 IMPLANT
PAD DRESSING TELFA 3X8 NADH (GAUZE/BANDAGES/DRESSINGS) IMPLANT
PATTIES SURGICAL .5 X.5 (GAUZE/BANDAGES/DRESSINGS) IMPLANT
PATTIES SURGICAL .5 X3 (DISPOSABLE) IMPLANT
PATTIES SURGICAL 1X1 (DISPOSABLE) IMPLANT
PLATE 1.5/0.5 13MM BURR HOLE (Plate) ×4 IMPLANT
SCREW SELF DRILL HT 1.5/4MM (Screw) ×16 IMPLANT
SPONGE NEURO XRAY DETECT 1X3 (DISPOSABLE) IMPLANT
SPONGE SURGIFOAM ABS GEL 100 (HEMOSTASIS) ×3 IMPLANT
STAPLER VISISTAT 35W (STAPLE) ×3 IMPLANT
SUT ETHILON 3 0 FSL (SUTURE) IMPLANT
SUT ETHILON 3 0 PS 1 (SUTURE) IMPLANT
SUT NURALON 4 0 TR CR/8 (SUTURE) ×9 IMPLANT
SUT STEEL 0 (SUTURE)
SUT STEEL 0 18XMFL TIE 17 (SUTURE) IMPLANT
SUT VIC AB 2-0 CT2 18 VCP726D (SUTURE) ×6 IMPLANT
TAPE CLOTH SURG 4X10 WHT LF (GAUZE/BANDAGES/DRESSINGS) ×2 IMPLANT
TOWEL GREEN STERILE (TOWEL DISPOSABLE) ×3 IMPLANT
TOWEL GREEN STERILE FF (TOWEL DISPOSABLE) ×3 IMPLANT
TRAY FOLEY MTR SLVR 16FR STAT (SET/KITS/TRAYS/PACK) ×3 IMPLANT
TUBE CONNECTING 12'X1/4 (SUCTIONS) ×1
TUBE CONNECTING 12X1/4 (SUCTIONS) ×2 IMPLANT
UNDERPAD 30X30 (UNDERPADS AND DIAPERS) ×3 IMPLANT
WATER STERILE IRR 1000ML POUR (IV SOLUTION) ×3 IMPLANT

## 2019-04-14 NOTE — OR Nursing (Signed)
Patient's silver colored wedding band given to patient's wife(Sheila) after she confirmed patient's name and date of birth.

## 2019-04-14 NOTE — Transfer of Care (Signed)
Immediate Anesthesia Transfer of Care Note  Patient: Jay Chen  Procedure(s) Performed: TEMPORAL CRANIOTOMY FOR HEMATOMA (Left Head)  Patient Location: NICU  Anesthesia Type:General  Level of Consciousness: sedated and Patient remains intubated per anesthesia plan  Airway & Oxygen Therapy: Patient remains intubated per anesthesia plan and Patient placed on Ventilator (see vital sign flow sheet for setting)  Post-op Assessment: Report given to RN and Post -op Vital signs reviewed and stable  Post vital signs: Reviewed and stable  Last Vitals:  Vitals Value Taken Time  BP    Temp    Pulse    Resp    SpO2 95 % 04/13/2019 1248    Last Pain: There were no vitals filed for this visit.       Complications: No apparent anesthesia complications

## 2019-04-14 NOTE — Progress Notes (Signed)
Pt is going to surgery and will reattempt after he is done.   04/12/2019 1000  PT Visit Information  Last PT Received On 03/28/2019  Reason Eval/Treat Not Completed Patient not medically ready    Mee Hives, PT MS Acute Rehab Dept. Number: [place] [facility] [place]

## 2019-04-14 NOTE — Anesthesia Postprocedure Evaluation (Signed)
Anesthesia Post Note  Patient: KEDARIUS VERBA  Procedure(s) Performed: TEMPORAL CRANIOTOMY FOR HEMATOMA (Left Head)     Patient location during evaluation: NICU Anesthesia Type: General Level of consciousness: sedated and patient remains intubated per anesthesia plan Pain management: pain level controlled Vital Signs Assessment: post-procedure vital signs reviewed and stable Respiratory status: patient remains intubated per anesthesia plan and patient on ventilator - see flowsheet for VS Cardiovascular status: stable Anesthetic complications: no    Last Vitals:  Vitals:   04/13/2019 1700 04/22/2019 1800  BP: 120/77 116/75  Pulse: (!) 50 (!) 51  Resp: 12 18  Temp:    SpO2: 98% 97%    Last Pain:  Vitals:   04/08/2019 1600  TempSrc: Axillary                 Rahiem Schellinger COKER

## 2019-04-14 NOTE — Anesthesia Preprocedure Evaluation (Signed)
Anesthesia Evaluation  Patient identified by MRN, date of birth, ID band Patient unresponsive    Reviewed: Patient's Chart, lab work & pertinent test results, Unable to perform ROS - Chart review only  Airway Mallampati: Intubated       Dental   Pulmonary former smoker,     + decreased breath sounds      Cardiovascular hypertension,  Rhythm:Regular Rate:Tachycardia     Neuro/Psych    GI/Hepatic   Endo/Other  diabetes  Renal/GU      Musculoskeletal   Abdominal (+) + obese,   Peds  Hematology   Anesthesia Other Findings   Reproductive/Obstetrics                             Anesthesia Physical Anesthesia Plan  ASA: IV and emergent  Anesthesia Plan: General   Post-op Pain Management:    Induction: Intravenous  PONV Risk Score and Plan: Ondansetron and Dexamethasone  Airway Management Planned: Oral ETT  Additional Equipment: Arterial line, CVP and Ultrasound Guidance Line Placement  Intra-op Plan:   Post-operative Plan: Post-operative intubation/ventilation  Informed Consent: I have reviewed the patients History and Physical, chart, labs and discussed the procedure including the risks, benefits and alternatives for the proposed anesthesia with the patient or authorized representative who has indicated his/her understanding and acceptance.       Plan Discussed with: CRNA and Anesthesiologist  Anesthesia Plan Comments:         Anesthesia Quick Evaluation

## 2019-04-14 NOTE — ED Triage Notes (Signed)
PT last seen normal 0715  After walking back from [facility].PT arrived via EMS from home Pt unable to speak on arrival to ED. Pt unable to move RT arm and Rt Leg . Slight rt sided facial droop . Pt has had a previous CVA .

## 2019-04-14 NOTE — Progress Notes (Signed)
RT transported patient with OR staff from OR to 4N25. No complications. VS stable. RT will continue to monitor.

## 2019-04-14 NOTE — Progress Notes (Signed)
SLP Cancellation Note  Patient Details Name: LATHAN QUIROA MRN: [id number] DOB: 08-12-1965   Cancelled treatment:       Reason Eval/Treat Not Completed: Patient not medically ready. Pt still intubated per chart.    Quintel Mccalla, Katherene Ponto 04/22/2019, 1:18 PM

## 2019-04-14 NOTE — ED Provider Notes (Signed)
[facility] EMERGENCY DEPARTMENT Provider Note   CSN: [id number] Arrival date & time: 04/10/2019  9030  An emergency department physician performed an initial assessment on this suspected stroke patient at 55.  History   Chief Complaint Chief Complaint  Patient presents with  . Code Stroke    HPI Jay Chen is a 53 y.o. male.     HPI   53 year old male with a history of congestive heart failure, hypertension, hyperlipidemia, atrial fibrillation on Xarelto, diabetes, aortic aneurysm, presents a code stroke with right-sided weakness the last known normal at 7:15 AM.  Per EMS and wife, patient had woken up this morning around 7 AM, at which time he was in a normal state of health, was able to ambulate to the bathroom, but abruptly at 7:15 AM developed right-sided weakness and eased himself to the floor.  Reported he had that weakness and slurred speech.  Wife called EMS, EMS concerned about right-sided weakness, left facial droop, and aphasia with patient having difficulty naming items in route.  History is limited by patient's mental status, acuity of condition.  Past Medical History:  Diagnosis Date  . CKD (chronic kidney disease), stage III ([place])   . Congestive heart failure [facility]) May of 2011   Felt to have cor pulmonale; EF 45 to 50% from echo in May 2011  . Cor pulmonale (chronic) ([place])   . Gallstones   . Gout    "take daily RX" (02/15/2018)  . History of kidney stones   . Hyperlipidemia   . Hypertension   . Morbid obesity ([place])   . Persistent atrial fibrillation    [name] 12/28/2017  . Sleep apnea    "dx'd; couldn't tolerate CPAP" (02/15/2018)  . Thoracic aneurysm    a. 4.8cm thoracic aortic aneurysm by CT 2013.  [name] Thoracic aortic aneurysm ([place])    known/notes 12/28/2017  . Type II diabetes mellitus [facility])     Patient Active Problem List   Diagnosis Date Noted  . ICH (intracerebral hemorrhage) ([place]) 04/22/2019  . Intracerebral hematoma ([place])  04/06/2019  . Endotracheal tube present   . Acute gout due to renal impairment involving left wrist 09/20/2018  . Acute pain of left wrist 09/19/2018  . Deficiency anemia 04/04/2018  . Occult blood in stools 04/04/2018  . Idiopathic gout 04/04/2018  . B12 deficiency 04/04/2018  . Hypoalbuminemia due to protein-calorie malnutrition ([place])   . Chronic combined systolic and diastolic congestive heart failure ([place])   . PAF (paroxysmal atrial fibrillation) ([place])   . CHF (congestive heart failure), NYHA class III ([place]) 02/15/2018  . CKD (chronic kidney disease) stage 4, GFR 15-29 ml/min (HCC)   . Left temporal lobe infarction ([place])   . Morbid obesity ([place])   . Diabetes mellitus with neuropathy ([place])   . Lumbar radiculopathy   . Cervical spinal cord compression ([place])   . DCM (dilated cardiomyopathy) ([place])   . Pure hyperglyceridemia 09/01/2017  . Hyperlipidemia with target LDL less than 100 09/11/2012  . Routine general medical examination at a health care facility 01/19/2012  . Cor pulmonale ([place]) 01/08/2011  . HTN (hypertension) 01/08/2011  . Aneurysm of thoracic aorta ([place]) 01/08/2011  . Obstructive sleep apnea 08/27/2007    Past Surgical History:  Procedure Laterality Date  . CARDIOVERSION N/A 02/22/2018   Procedure: CARDIOVERSION;  Surgeon: Jolaine Artist, MD;  Location: [facility] ENDOSCOPY;  Service: Cardiovascular;  Laterality: N/A;  . CARDIOVERSION N/A 03/01/2018   Procedure: CARDIOVERSION;  Surgeon: Jolaine Artist,  MD;  Location: [facility];  Service: Cardiovascular;  Laterality: N/A;  . LAPAROSCOPIC CHOLECYSTECTOMY  2000  . RIGHT/LEFT HEART CATH AND CORONARY ANGIOGRAPHY N/A 02/20/2018   Procedure: RIGHT/LEFT HEART CATH AND CORONARY ANGIOGRAPHY;  Surgeon: Jolaine Artist, MD;  Location: [facility] CV LAB;  Service: Cardiovascular;  Laterality: N/A;  . US ECHOCARDIOGRAPHY  09/21/2009   EF 45-50%; Cavity size was severely dilated, severe concentric hypertrophy and normal  wall motion        Home Medications    Prior to Admission medications   Medication Sig Start Date End Date Taking? Authorizing Provider  acetaminophen (TYLENOL) 325 MG tablet Take 1-2 tablets (325-650 mg total) by mouth every 6 (six) hours as needed for mild pain. 03/01/18  Yes Love, Ivan Anchors, PA-C  allopurinol (ZYLOPRIM) 100 MG tablet TAKE 1 TABLET(100 MG) BY MOUTH DAILY Patient taking differently: Take 100 mg by mouth daily.  11/28/18  Yes [name], MD  amiodarone (PACERONE) 200 MG tablet Take 1 tablet (200 mg total) by mouth daily. 08/31/18  Yes Georgiana Shore, NP  aspirin EC 81 MG tablet Take 81 mg by mouth daily.   Yes [provider]  atorvastatin (LIPITOR) 80 MG tablet Take 80 mg by mouth daily.   Yes [provider]  Colchicine 0.6 MG CAPS Take 1 capsule by mouth 2 (two) times daily. 02/14/19  Yes [name], MD  hydrALAZINE (APRESOLINE) 100 MG tablet Take 1 tablet (100 mg total) by mouth 3 (three) times daily. 09/29/18  Yes Clegg, Amy D, NP  isosorbide mononitrate (IMDUR) 30 MG 24 hr tablet Take 1 tablet (30 mg total) by mouth daily. 09/29/18  Yes Clegg, Amy D, NP  losartan (COZAAR) 50 MG tablet Take 1 tablet (50 mg total) by mouth daily. Please cancel all previous orders for current medication. Change in dosage or pill size. 11/13/18  Yes Bensimhon, Shaune Pascal, MD  metoprolol succinate (TOPROL-XL) 100 MG 24 hr tablet Take 1 tablet (100 mg total) by mouth daily. 08/31/18  Yes Georgiana Shore, NP  omega-3 acid ethyl esters (LOVAZA) 1 g capsule Take 2 capsules (2 g total) by mouth 2 (two) times daily. 01/16/18  Yes Angiulli, Lavon Paganini, PA-C  potassium chloride SA (KLOR-CON M20) 20 MEQ tablet Take 2 tablets (40 mEq total) by mouth daily. 08/31/18  Yes Georgiana Shore, NP  rivaroxaban (XARELTO) 20 MG TABS tablet Take 20 mg by mouth daily with supper.   Yes [provider]  spironolactone (ALDACTONE) 25 MG tablet Take 0.5 tablets (12.5 mg total) by mouth daily. Please  cancel all previous orders for current medication. Change in dosage or pill size. 08/31/18 03/31/2019 Yes Georgiana Shore, NP  torsemide (DEMADEX) 20 MG tablet TAKE 2 TABLETS(40 MG) BY MOUTH EVERY MORNING. MAY ALSO TAKE 1 TABLET 20 MG TOTAL AS NEEDED FOR WEIGHT 343 OR GREATER Patient taking differently: Take 40 mg by mouth daily. May also take 1 additional tablet as needed if weight is over 343 pounds 11/08/18  Yes Clegg, Amy D, NP    Family History Family History  Problem Relation Age of Onset  . Hypertension Mother   . Pancreatic cancer Father   . Prostate cancer Father   . Colon cancer Father     Social History Social History   Tobacco Use  . Smoking status: Former Research scientist (life sciences)  . Smokeless tobacco: Never Used  . Tobacco comment: did in college  Substance Use Topics  . Alcohol use: Not Currently  Comment: 02/15/2018 "nothing in the last couple years"  . Drug use: Never     Allergies   Patient has no known allergies.   Review of Systems Review of Systems  Unable to perform ROS: Acuity of condition  Gastrointestinal: Negative for vomiting.  Neurological: Positive for speech difficulty and weakness.     Physical Exam Updated Vital Signs BP 140/75   Pulse 60   Temp 97.6 F (36.4 C) (Axillary)   Resp 16   Ht 5' 11"  (1.803 m)   SpO2 97%   BMI 50.52 kg/m   Physical Exam Vitals signs and nursing note reviewed.  Constitutional:      General: He is not in acute distress.    Appearance: He is well-developed. He is not diaphoretic.  HENT:     Head: Normocephalic and atraumatic.  Eyes:     Conjunctiva/sclera: Conjunctivae normal.  Neck:     Musculoskeletal: Normal range of motion.  Cardiovascular:     Rate and Rhythm: Normal rate and regular rhythm.     Heart sounds: Normal heart sounds. No murmur. No friction rub. No gallop.   Pulmonary:     Effort: Pulmonary effort is normal. No respiratory distress.     Breath sounds: Normal breath sounds. No wheezing or rales.      Comments: Snoring respirations Abdominal:     General: There is no distension.     Palpations: Abdomen is soft.     Tenderness: There is no abdominal tenderness. There is no guarding.  Skin:    General: Skin is warm and dry.  Neurological:     Mental Status: He is lethargic.     Comments: Left gaze preference, eyes not crossing midline on exam Sonorous respirations, answers questions yes/no, states name but is difficult to understand, sleepy, awakens to stimulation but will immediately sleep again with sonorous respirations Pupils equal, unable to participate in EOM, facial exam limited by positioning, Unable to lift right side at all, some occ extensor posturing noted Left side strength 5/5        ED Treatments / Results  Labs (all labs ordered are listed, but only abnormal results are displayed) Labs Reviewed  PROTIME-INR - Abnormal; Notable for the following components:      Result Value   Prothrombin Time 20.4 (*)    INR 1.8 (*)    All other components within normal limits  APTT - Abnormal; Notable for the following components:   aPTT 37 (*)    All other components within normal limits  COMPREHENSIVE METABOLIC PANEL - Abnormal; Notable for the following components:   Glucose, Bld 134 (*)    BUN 27 (*)    Creatinine, Ser 1.72 (*)    Calcium 8.7 (*)    Total Protein 6.4 (*)    Albumin 3.3 (*)    GFR calc non Af Amer 44 (*)    GFR calc Af Amer 51 (*)    All other components within normal limits  CBC - Abnormal; Notable for the following components:   RBC 4.13 (*)    Hemoglobin 12.8 (*)    HCT 38.6 (*)    All other components within normal limits  BASIC METABOLIC PANEL - Abnormal; Notable for the following components:   Glucose, Bld 152 (*)    BUN 28 (*)    Creatinine, Ser 1.73 (*)    Calcium 8.5 (*)    GFR calc non Af Amer 43 (*)    GFR calc Af Amer 50 (*)  All other components within normal limits  HEMOGLOBIN A1C - Abnormal; Notable for the following  components:   Hgb A1c MFr Bld 5.8 (*)    All other components within normal limits  GLUCOSE, CAPILLARY - Abnormal; Notable for the following components:   Glucose-Capillary 152 (*)    All other components within normal limits  GLUCOSE, CAPILLARY - Abnormal; Notable for the following components:   Glucose-Capillary 141 (*)    All other components within normal limits  I-STAT CHEM 8, ED - Abnormal; Notable for the following components:   BUN 30 (*)    Creatinine, Ser 1.70 (*)    Glucose, Bld 126 (*)    Calcium, Ion 1.09 (*)    All other components within normal limits  CBG MONITORING, ED - Abnormal; Notable for the following components:   Glucose-Capillary 118 (*)    All other components within normal limits  POCT I-STAT 7, (LYTES, BLD GAS, ICA,H+H) - Abnormal; Notable for the following components:   pCO2 arterial 55.7 (*)    pO2, Arterial 73.0 (*)    Bicarbonate 31.9 (*)    TCO2 34 (*)    Acid-Base Excess 5.0 (*)    Calcium, Ion 1.14 (*)    HCT 37.0 (*)    Hemoglobin 12.6 (*)    All other components within normal limits  POCT I-STAT 7, (LYTES, BLD GAS, ICA,H+H) - Abnormal; Notable for the following components:   pO2, Arterial 78.0 (*)    Bicarbonate 28.2 (*)    Acid-Base Excess 3.0 (*)    HCT 36.0 (*)    Hemoglobin 12.2 (*)    All other components within normal limits  MRSA PCR SCREENING  CBC  DIFFERENTIAL  HIV ANTIBODY (ROUTINE TESTING W REFLEX)  BLOOD GAS, ARTERIAL  BLOOD GAS, ARTERIAL  TRIGLYCERIDES  BASIC METABOLIC PANEL  MAGNESIUM  POC SARS CORONAVIRUS 2 AG -  ED  ABO/RH  PREPARE RBC (CROSSMATCH)  TYPE AND SCREEN  SURGICAL PATHOLOGY    EKG EKG Interpretation  Date/Time:  Saturday April 14 2019 09:42:36 EST Ventricular Rate:  61 PR Interval:    QRS Duration: 117 QT Interval:  524 QTC Calculation: 528 R Axis:   -137 Text Interpretation: Sinus rhythm Prolonged PR interval Incomplete right bundle branch block Anteroseptal infarct, age indeterminate No  significant change since last tracing Confirmed by Gareth Morgan [phone]) on 04/13/2019 10:03:25 AM   Radiology Ct Code Stroke Cta Head W/wo Contrast  Result Date: 04/07/2019 CLINICAL DATA:  Focal neural deficit with stroke suspected. EXAM: CT ANGIOGRAPHY HEAD TECHNIQUE: Multidetector CT imaging of the head was performed using the standard protocol during bolus administration of intravenous contrast. Multiplanar CT image reconstructions and MIPs were obtained to evaluate the vascular anatomy. CONTRAST:  90m OMNIPAQUE IOHEXOL 350 MG/ML SOLN COMPARISON:  Head CT from earlier today.  Brain MRA 01/01/2018 FINDINGS: CTA HEAD Anterior circulation: Vessels are smooth and widely patent. No aneurysm or vascular malformation seen. At the level of the hemorrhage there are 4 contrast dense areas compatible with spot sign. Posterior circulation: Right vertebral artery dominance. Mild atherosclerotic calcification of the right vertebral artery. Hypoplastic left P1 segment. No branch occlusion, beading, or aneurysm. Venous sinuses: Limited assessment given arterial timing. No evident thrombosis. Anatomic variants: As above IMPRESSION: 1. No evidence of aneurysm, vascular malformation, or vasculitis. 2. There are areas of CTA spot sign which increase changes of hematoma growth. Electronically Signed   By: JMonte FantasiaM.D.   On: 04/15/2019 09:12   [facility]  Result Date: 04/09/2019 CLINICAL DATA:  53 year old male currently intubated and now status post central line placement EXAM: PORTABLE CHEST 1 VIEW COMPARISON:  Chest x-ray obtained earlier today FINDINGS: The patient remains intubated. The tip of the endotracheal tube is well positioned 6.9 cm above the carina. New right IJ approach central venous catheter. The tip of the catheter overlies the mid SVC. Stable cardiomegaly. Overall, improved inspiratory volumes with decreased atelectasis. Decreased interstitial prominence is also evident suggesting  decreasing pulmonary edema. Persistent dense left basilar airspace opacity. IMPRESSION: 1. New right IJ central venous catheter with the tip overlying the mid SVC. 2. No evidence of pneumothorax or other complication. 3. Overall improved aeration with decreased right basilar atelectasis and decreased interstitial edema versus vascular crowding. 4. Persistent dense left basilar airspace opacity. Electronically Signed   By: Jacqulynn Cadet M.D.   On: 04/01/2019 14:19   Dg Chest Portable 1 View  Result Date: 04/13/2019 CLINICAL DATA:  Status post intubation. EXAM: PORTABLE CHEST 1 VIEW COMPARISON:  03/24/2018 FINDINGS: 0952 hours. Rotated low volume film. Endotracheal tube tip is 3.7 cm above the base of the carina. The cardio pericardial silhouette is enlarged. Vascular congestion with interstitial pulmonary edema noted. Bibasilar collapse/consolidation. IMPRESSION: Endotracheal tube tip 3.7 cm above the carina. Low volume film with cardiomegaly, vascular congestion and interstitial pulmonary edema. Bibasilar collapse/consolidation, left greater than right. Electronically Signed   By: Misty Stanley M.D.   On: 04/03/2019 10:25   Ct Head Code Stroke Wo Contrast  Result Date: 04/13/2019 CLINICAL DATA:  Code stroke.  Right-sided weakness.  02/08/2015 EXAM: CT HEAD WITHOUT CONTRAST TECHNIQUE: Contiguous axial images were obtained from the base of the skull through the vertex without intravenous contrast. COMPARISON:  03/02/2018 brain MRI FINDINGS: Brain: 5.7 x 4.1 x 4.2 cm heterogeneous hematoma in the white matter of the left temporal lobe, subinsular region, and Corona radiata/external capsule. Mild/early edema. Local mass effect is mild considering the size of hemorrhage. No hydrocephalus or intraventricular extension. No adjacent infarct is noted. Vascular: Atherosclerotic calcification. Skull: Negative Sinuses/Orbits: Negative Other: Critical Value/emergent results were called by telephone at the time of  interpretation on 04/12/2019 at 8:30 am to providerLindzen , who verbally acknowledged these results. ASPECTS [facility] Stroke Program Early CT Score) Not scored in this setting IMPRESSION: 49 cc hematoma in the left cerebral white matter as described. White matter infarct and small lobar micro hemorrhages were seen on a 2019 brain MRI. Electronically Signed   By: Monte Fantasia M.D.   On: 04/11/2019 08:33    Procedures .Critical Care Performed by: Gareth Morgan, MD Authorized by: Gareth Morgan, MD   Critical care provider statement:    Critical care time (minutes):  79   Critical care was time spent personally by me on the following activities:  Discussions with consultants, evaluation of patient's response to treatment, examination of patient, ordering and performing treatments and interventions, ordering and review of laboratory studies, ordering and review of radiographic studies, pulse oximetry, re-evaluation of patient's condition, obtaining history from patient or surrogate and review of old charts Procedure Name: Intubation Date/Time: 04/19/2019 8:58 PM Performed by: Gareth Morgan, MD Pre-anesthesia Checklist: Patient identified, Patient being monitored, Emergency Drugs available, Timeout performed and Suction available Oxygen Delivery Method: Non-rebreather mask Preoxygenation: Pre-oxygenation with 100% oxygen Induction Type: Rapid sequence Ventilation: Mask ventilation without difficulty Laryngoscope Size: Glidescope and 4 Grade View: Grade I Number of attempts: 1 Placement Confirmation: ETT inserted through vocal cords under direct vision,  CO2  detector and Breath sounds checked- equal and bilateral Tube secured with: ETT holder      (including critical care time)  Medications Ordered in ED Medications  sodium chloride flush (NS) 0.9 % injection 3 mL ( Intravenous MAR [place] 03/27/2019 1300)  clevidipine (CLEVIPREX) 0.5 MG/ML infusion (has no administration in time  range)  labetalol (NORMODYNE) 5 MG/ML injection (has no administration in time range)  levETIRAcetam (KEPPRA) IVPB 1000 mg/100 mL premix ( Intravenous MAR Unhold 04/21/2019 1300)  levETIRAcetam (KEPPRA) IVPB 1000 mg/100 mL premix ( Intravenous MAR Unhold 03/28/2019 1300)    And  levETIRAcetam (KEPPRA) IVPB 1000 mg/100 mL premix ( Intravenous MAR Unhold 03/31/2019 1300)  acetaminophen (TYLENOL) tablet 650 mg ( Oral MAR Unhold 04/13/2019 1300)    Or  acetaminophen (TYLENOL) 160 MG/5ML solution 650 mg ( Per Tube MAR Unhold 04/23/2019 1300)    Or  acetaminophen (TYLENOL) suppository 650 mg ( Rectal MAR Unhold 04/21/2019 1300)  senna-docusate (Senokot-S) tablet 1 tablet ( Oral MAR Unhold 04/12/2019 1300)  pantoprazole (PROTONIX) injection 40 mg ( Intravenous MAR Unhold 04/12/2019 1300)  clevidipine (CLEVIPREX) infusion 0.5 mg/mL (0 mg/hr Intravenous New Bag/Given 04/16/2019 1010)  allopurinol (ZYLOPRIM) tablet 100 mg ( Oral MAR Unhold 04/13/2019 1300)  amiodarone (PACERONE) tablet 200 mg ( Oral MAR Unhold 04/07/2019 1300)  insulin aspart (novoLOG) injection 0-20 Units (4 Units Subcutaneous Given 03/31/2019 1550)  chlorhexidine gluconate (MEDLINE KIT) (PERIDEX) 0.12 % solution 15 mL (has no administration in time range)  MEDLINE mouth rinse (15 mLs Mouth Rinse Given 04/16/2019 1838)  albuterol (PROVENTIL) (2.5 MG/3ML) 0.083% nebulizer solution 2.5 mg (has no administration in time range)  propofol (DIPRIVAN) 1000 MG/100ML infusion (50 mcg/kg/min  175 kg (Order-Specific) Intravenous New Bag/Given 04/19/2019 1851)  fentaNYL (SUBLIMAZE) injection 50 mcg (has no administration in time range)  fentaNYL 2560mg in NS 2543m(1027mml) infusion-PREMIX (has no administration in time range)  fentaNYL (SUBLIMAZE) bolus via infusion 50 mcg (has no administration in time range)  bisacodyl (DULCOLAX) suppository 10 mg (has no administration in time range)  docusate (COLACE) 50 MG/5ML liquid 100 mg (has no administration in time range)   chlorhexidine gluconate (MEDLINE KIT) (PERIDEX) 0.12 % solution 15 mL (has no administration in time range)  Chlorhexidine Gluconate Cloth 2 % PADS 6 each (6 each Topical Given 04/18/2019 1750)  coag fact Xa recombinant (ANDEXXA) low dose infusion 900 mg (900 mg Intravenous New Bag/Given 04/02/2019 0950)  iohexol (OMNIPAQUE) 350 MG/ML injection 50 mL (50 mLs Intravenous Contrast Given 04/01/2019 0843)  propofol (DIPRIVAN) 1000 MG/100ML infusion (  Override pull for Anesthesia 04/16/2019 1203)  LORazepam (ATIVAN) injection 2 mg (2 mg Intravenous Given 04/21/2019 0940)  mannitol 25 % IVPB 25 g (25 g Intravenous New Bag/Given 04/17/2019 0945)   stroke: mapping our early stages of recovery book ( Does not apply Given 03/29/2019 1552)  0.9 %  sodium chloride infusion (10 mL/hr Intravenous New Bag/Given 03/31/2019 1332)  propofol (DIPRIVAN) 10 mg/mL bolus/IV push (has no administration in time range)  fentaNYL (SUBLIMAZE) 250 MCG/5ML injection (has no administration in time range)  bacitracin 500 UNIT/GM ointment (has no administration in time range)  lidocaine-EPINEPHrine 0.5 %-1:200000 (with pres) injection (has no administration in time range)  thrombin 20000 units spray (has no administration in time range)  thrombin 5000 units spray (has no administration in time range)  propofol (DIPRIVAN) 1000 MG/100ML infusion (has no administration in time range)     Initial Impression / Assessment and Plan / ED Course  I have reviewed  the triage vital signs and the nursing notes.  Pertinent labs & imaging results that were available during my care of the patient were reviewed by me and considered in my medical decision making (see chart for details).       53 year old male with a history of congestive heart failure, hypertension, hyperlipidemia, atrial fibrillation on Xarelto, diabetes, aortic aneurysm, presents a code stroke with right-sided weakness the last known normal at 7:15 AM.  Patient arrives as Code  Stroke with EMS with Dr. Cheral Marker at bedside. Taken to CT which shows large intraparenchymal hemorrhage on left.  Delay in obtaining CTA given difficult IV access.  Given andexxa, clefiprex for reversal and blood pressure control.  Intubated for airway protection and expected course of treatment upon returning to ED room. Placed on proporofol, keppra given for seizure like activity. NSU consulted by Neurology and mannitol given per NSU recommendations.  Pt taken emergently to the OR for further care by Dr. Christella Noa of NSU.   Final Clinical Impressions(s) / ED Diagnoses   Final diagnoses:  Intracranial hemorrhage [facility])  Essential hypertension  Somnolence    ED Discharge Orders    None       Gareth Morgan, MD 04/07/2019 2100

## 2019-04-14 NOTE — OR Nursing (Signed)
Dr. Christella Noa attempted to insert a foley catheter, but he was unsuccessful  Urologist called to place catheter after surgery.

## 2019-04-14 NOTE — H&P (Signed)
Jay Chen is an 53 y.o. male.   Chief Complaint: left temporal lobe ich HPI: gentleman whom awoke this morning with headache, and then had progressive mental decline. Came to ED, secondary to sonorous respirations and declining mental was intubated. Head CT revealed left temporal ich.   Past Medical History:  Diagnosis Date  . CKD (chronic kidney disease), stage III ([place])   . Congestive heart failure [facility]) May of 2011   Felt to have cor pulmonale; EF 45 to 50% from echo in May 2011  . Cor pulmonale (chronic) ([place])   . Gallstones   . Gout    "take daily RX" (02/15/2018)  . History of kidney stones   . Hyperlipidemia   . Hypertension   . Morbid obesity ([place])   . Persistent atrial fibrillation    [name] 12/28/2017  . Sleep apnea    "dx'd; couldn't tolerate CPAP" (02/15/2018)  . Thoracic aneurysm    a. 4.8cm thoracic aortic aneurysm by CT 2013.  [name] Thoracic aortic aneurysm ([place])    known/notes 12/28/2017  . Type II diabetes mellitus ([place])     Past Surgical History:  Procedure Laterality Date  . CARDIOVERSION N/A 02/22/2018   Procedure: CARDIOVERSION;  Surgeon: Jolaine Artist, MD;  Location: [facility] ENDOSCOPY;  Service: Cardiovascular;  Laterality: N/A;  . CARDIOVERSION N/A 03/01/2018   Procedure: CARDIOVERSION;  Surgeon: Jolaine Artist, MD;  Location: [place];  Service: Cardiovascular;  Laterality: N/A;  . LAPAROSCOPIC CHOLECYSTECTOMY  2000  . RIGHT/LEFT HEART CATH AND CORONARY ANGIOGRAPHY N/A 02/20/2018   Procedure: RIGHT/LEFT HEART CATH AND CORONARY ANGIOGRAPHY;  Surgeon: Jolaine Artist, MD;  Location: [place] CV LAB;  Service: Cardiovascular;  Laterality: N/A;  . US ECHOCARDIOGRAPHY  09/21/2009   EF 45-50%; Cavity size was severely dilated, severe concentric hypertrophy and normal wall motion    Family History  Problem Relation Age of Onset  . Hypertension Mother   . Pancreatic cancer Father   . Prostate cancer Father   . Colon cancer Father    Social  History:  reports that he has quit smoking. He has never used smokeless tobacco. He reports previous alcohol use. He reports that he does not use drugs.  Allergies: No Known Allergies  (Not in a hospital admission)   Results for orders placed or performed during the hospital encounter of 03/26/2019 (from the past 48 hour(s))  CBG monitoring, ED     Status: Abnormal   Collection Time: 03/27/2019  8:13 AM  Result Value Ref Range   Glucose-Capillary 118 (H) 70 - 99 mg/dL  Protime-INR     Status: Abnormal   Collection Time: 04/17/2019  8:23 AM  Result Value Ref Range   Prothrombin Time 20.4 (H) 11.4 - 15.2 seconds   INR 1.8 (H) 0.8 - 1.2    Comment: (NOTE) INR goal varies based on device and disease states. Performed at [facility], [place] [address], [place], [place] [address]   APTT     Status: Abnormal   Collection Time: 03/30/2019  8:23 AM  Result Value Ref Range   aPTT 37 (H) 24 - 36 seconds    Comment:        IF BASELINE aPTT IS ELEVATED, SUGGEST PATIENT RISK ASSESSMENT BE USED TO DETERMINE APPROPRIATE ANTICOAGULANT THERAPY. Performed at [facility], [place] [address], [place], [place] [address]   CBC     Status: None   Collection Time: 04/21/2019  8:23 AM  Result Value Ref Range  WBC 4.7 4.0 - 10.5 K/uL   RBC 4.57 4.22 - 5.81 MIL/uL   Hemoglobin 13.8 13.0 - 17.0 g/dL   HCT 43.6 39.0 - 52.0 %   MCV 95.4 80.0 - 100.0 fL   MCH 30.2 26.0 - 34.0 pg   MCHC 31.7 30.0 - 36.0 g/dL   RDW 14.0 11.5 - 15.5 %   Platelets 228 150 - 400 K/uL   nRBC 0.0 0.0 - 0.2 %    Comment: Performed at [facility], [place] [address] [place], [place] [address]  Differential     Status: None   Collection Time: 04/03/2019  8:23 AM  Result Value Ref Range   Neutrophils Relative % 50 %   Neutro Abs 2.4 1.7 - 7.7 K/uL   Lymphocytes Relative 32 %   Lymphs Abs 1.5 0.7 - 4.0 K/uL   Monocytes Relative 14 %   Monocytes Absolute 0.6 0.1 - 1.0 K/uL   Eosinophils Relative 3 %   Eosinophils  Absolute 0.1 0.0 - 0.5 K/uL   Basophils Relative 1 %   Basophils Absolute 0.1 0.0 - 0.1 K/uL   Immature Granulocytes 0 %   Abs Immature Granulocytes 0.01 0.00 - 0.07 K/uL    Comment: Performed at [facility] [address], [place], [place] [address]  Comprehensive metabolic panel     Status: Abnormal   Collection Time: 04/17/2019  8:23 AM  Result Value Ref Range   Sodium 141 135 - 145 mmol/L   Potassium 3.9 3.5 - 5.1 mmol/L   Chloride 104 98 - 111 mmol/L   CO2 29 22 - 32 mmol/L   Glucose, Bld 134 (H) 70 - 99 mg/dL   BUN 27 (H) 6 - 20 mg/dL   Creatinine, Ser 1.72 (H) 0.61 - 1.24 mg/dL   Calcium 8.7 (L) 8.9 - 10.3 mg/dL   Total Protein 6.4 (L) 6.5 - 8.1 g/dL   Albumin 3.3 (L) 3.5 - 5.0 g/dL   AST 21 15 - 41 U/L   ALT 21 0 - 44 U/L   Alkaline Phosphatase 89 38 - 126 U/L   Total Bilirubin 0.7 0.3 - 1.2 mg/dL   GFR calc non Af Amer 44 (L) >60 mL/min   GFR calc Af Amer 51 (L) >60 mL/min   Anion gap 8 5 - 15    Comment: Performed at [facility], [place] [address] [place], [place] [address]  I-stat chem 8, ED     Status: Abnormal   Collection Time: 04/13/2019  8:30 AM  Result Value Ref Range   Sodium 143 135 - 145 mmol/L   Potassium 3.9 3.5 - 5.1 mmol/L   Chloride 102 98 - 111 mmol/L   BUN 30 (H) 6 - 20 mg/dL   Creatinine, Ser 1.70 (H) 0.61 - 1.24 mg/dL   Glucose, Bld 126 (H) 70 - 99 mg/dL   Calcium, Ion 1.09 (L) 1.15 - 1.40 mmol/L   TCO2 30 22 - 32 mmol/L   Hemoglobin 14.6 13.0 - 17.0 g/dL   HCT 43.0 39.0 - 52.0 %  POC SARS Coronavirus 2 Ag-ED - Nasal Swab (BD Veritor Kit)     Status: None   Collection Time: 04/16/2019  8:45 AM  Result Value Ref Range   SARS Coronavirus 2 Ag NEGATIVE NEGATIVE    Comment: (NOTE) SARS-CoV-2 antigen NOT DETECTED.  Negative results are presumptive.  Negative results do not preclude SARS-CoV-2 infection and should not be used as the sole basis for  treatment or other patient management decisions, including infection  control decisions,  particularly in the presence of clinical signs and  symptoms consistent with COVID-19, or in those who have been in contact with the virus.  Negative results must be combined with clinical observations, patient history, and epidemiological information. The expected result is Negative. Fact Sheet for Patients: PodPark.tn Fact Sheet for Healthcare Providers: GiftContent.is This test is not yet approved or cleared by the [place] FDA and  has been authorized for detection and/or diagnosis of SARS-CoV-2 by FDA under an Emergency Use Authorization (EUA).  This EUA will remain in effect (meaning this test can be used) for the duration of  the COVID-19 de claration under Section 564(b)(1) of the Act, 21 U.S.C. section 360bbb-3(b)(1), unless the authorization is terminated or revoked sooner.    Ct Code Stroke Cta Head W/wo Contrast  Result Date: 04/05/2019 CLINICAL DATA:  Focal neural deficit with stroke suspected. EXAM: CT ANGIOGRAPHY HEAD TECHNIQUE: Multidetector CT imaging of the head was performed using the standard protocol during bolus administration of intravenous contrast. Multiplanar CT image reconstructions and MIPs were obtained to evaluate the vascular anatomy. CONTRAST:  82m OMNIPAQUE IOHEXOL 350 MG/ML SOLN COMPARISON:  Head CT from earlier today.  Brain MRA 01/01/2018 FINDINGS: CTA HEAD Anterior circulation: Vessels are smooth and widely patent. No aneurysm or vascular malformation seen. At the level of the hemorrhage there are 4 contrast dense areas compatible with spot sign. Posterior circulation: Right vertebral artery dominance. Mild atherosclerotic calcification of the right vertebral artery. Hypoplastic left P1 segment. No branch occlusion, beading, or aneurysm. Venous sinuses: Limited assessment given arterial timing. No evident thrombosis. Anatomic variants: As above IMPRESSION: 1. No evidence of aneurysm, vascular  malformation, or vasculitis. 2. There are areas of CTA spot sign which increase changes of hematoma growth. Electronically Signed   By: JMonte FantasiaM.D.   On: 04/05/2019 09:12   Ct Head Code Stroke Wo Contrast  Result Date: 04/10/2019 CLINICAL DATA:  Code stroke.  Right-sided weakness.  02/08/2015 EXAM: CT HEAD WITHOUT CONTRAST TECHNIQUE: Contiguous axial images were obtained from the base of the skull through the vertex without intravenous contrast. COMPARISON:  03/02/2018 brain MRI FINDINGS: Brain: 5.7 x 4.1 x 4.2 cm heterogeneous hematoma in the white matter of the left temporal lobe, subinsular region, and Corona radiata/external capsule. Mild/early edema. Local mass effect is mild considering the size of hemorrhage. No hydrocephalus or intraventricular extension. No adjacent infarct is noted. Vascular: Atherosclerotic calcification. Skull: Negative Sinuses/Orbits: Negative Other: Critical Value/emergent results were called by telephone at the time of interpretation on 03/25/2019 at 8:30 am to providerLindzen , who verbally acknowledged these results. ASPECTS ([facility]) Not scored in this setting IMPRESSION: 49 cc hematoma in the left cerebral white matter as described. White matter infarct and small lobar micro hemorrhages were seen on a 2019 brain MRI. Electronically Signed   By: JMonte FantasiaM.D.   On: 04/06/2019 08:33    Review of Systems  Unable to perform ROS: Intubated    Height '5\' 11"'$  (1.803 m). Physical Exam  Constitutional:  Morbid obesity  HENT:  Head: Normocephalic and atraumatic.  Eyes: Pupils are equal, round, and reactive to light.  Minimal if any response, pinpoint  Neurological: He is unresponsive. A cranial nerve deficit and sensory deficit is present. GCS eye subscore is 1. GCS verbal subscore is 1. GCS motor subscore is 2.     Assessment/Plan OR for emergent craniotomy Explained dire circumstances  to family at bedside. Risks of  stroke, coma, death, brain damage, continued bleeding. She understood and wishes to proceed Ashok Pall, MD 03/30/2019, 9:50 AM

## 2019-04-14 NOTE — Anesthesia Procedure Notes (Signed)
Arterial Line Insertion Start/End11/30/2020 10:43 AM, 04/14/2019 11:45 AM Performed by: Renato Shin, CRNA  Patient location: Pre-op. Preanesthetic checklist: patient identified, IV checked, site marked, risks and benefits discussed, surgical consent, monitors and equipment checked, pre-op evaluation, timeout performed and anesthesia consent Lidocaine 1% used for infiltration Right, radial was placed Catheter size: 20 Fr Hand hygiene performed  and maximum sterile barriers used   Attempts: 1 Procedure performed without using ultrasound guided technique. Following insertion, dressing applied. Post procedure assessment: normal and unchanged

## 2019-04-14 NOTE — Progress Notes (Signed)
RT note late entry: RT and RN transported patient from ER to OR on ventilator. Vital signs stable through out.

## 2019-04-14 NOTE — Op Note (Signed)
04/01/2019  1:18 PM  PATIENT:  Jay Chen  53 y.o. male  PRE-OPERATIVE DIAGNOSIS:  Left temporal Intracerebral Hematoma  POST-OPERATIVE DIAGNOSIS:  Left temporal Intracerebral Hematoma  PROCEDURE:  Procedure(s):Left TEMPORAL CRANIOTOMY FOR HEMATOMA  SURGEON: Surgeon(s): Ashok Pall, MD  ASSISTANTS:none  ANESTHESIA:   general  EBL:  Total I/O In: 700 [I.V.:700] Out: 300 [Blood:300]  BLOOD ADMINISTERED:none  CELL SAVER GIVEN:none  COUNT:per nursing  DRAINS: none   SPECIMEN:  Source of Specimen:  left temporal lobe  DICTATION: [name] was taken to the operating room, and placed under a general anesthetic without difficulty.He was positioned supine on the operating table. His head was shaved  was prepped and was draped in a sterile manner. I attempted to place a foley catheter but was unable to secondary to his phimosis.  Once adequate anesthesia was obtained I placed his head in a three pin head holder with 60lbs of pressure. I attached his head to the [place] adapter on the bed. I turned his head to the right exposing the left temporal region. I used a linear coronal incision to expose the temporal bone just caudal to the acoustic meatus and above the tragus. I created burr holes and turned a small craniotomy with the router blade. I opened the dura in a cruciate fashion and revealed cerebrum under great pressure. I went through the temporal lobe and removed the clot with suction and irrigation. I took a small sample of what could have been choroid plexus, but did not have ventricular fluid pouring out. Felt it best to send it to pathology.  I irrigated and controlled bleeding in the wound bed. Used surgicel to line a portion of the hematoma bed, and gelfoam in others.I loosely approximated the dura and covered the defect with duragen. I placed two small burr hole covers and secured the bone flap. I approximated the galea, and scalp edges. I applied a sterile  dressing. He remained intubated and transferred to the ICU   PLAN OF CARE: Admit to inpatient   PATIENT DISPOSITION:  ICU - intubated and critically ill.   Delay start of Pharmacological VTE agent (>24hrs) due to surgical blood loss or risk of bleeding:    yes

## 2019-04-14 NOTE — Consult Note (Signed)
Consultation: Phimosis, difficult Foley Requested by: Ashok Pall, MD  History of Present Illness: Jay Chen is a 53 year old male who presented with declining mental status and was intubated.  He had a large intracranial hemorrhage and clot and Dr. Christella Noa took to the OR for craniotomy.  A Foley catheter could not be placed and given patient's expected prolonged neurologic deficits bladder drainage was requested.  He sees Dr. Karsten Ro with past GU h/o Calculus disease, Dysfunctional voiding (he began to experience nocturia with nocturnal enuresis in 9/08. Urodynamics revealed dysfunctional voiding with bladder overactivity and he was treated with anticholinergics and physical therapy), oligospermia, and phimosis.   Past Medical History:  Diagnosis Date  . CKD (chronic kidney disease), stage III ([place])   . Congestive heart failure [facility]) May of 2011   Felt to have cor pulmonale; EF 45 to 50% from echo in May 2011  . Cor pulmonale (chronic) ([place])   . Gallstones   . Gout    "take daily RX" (02/15/2018)  . History of kidney stones   . Hyperlipidemia   . Hypertension   . Morbid obesity ([place])   . Persistent atrial fibrillation    [name] 12/28/2017  . Sleep apnea    "dx'd; couldn't tolerate CPAP" (02/15/2018)  . Thoracic aneurysm    a. 4.8cm thoracic aortic aneurysm by CT 2013.  [name] Thoracic aortic aneurysm ([place])    known/notes 12/28/2017  . Type II diabetes mellitus ([place])    Past Surgical History:  Procedure Laterality Date  . CARDIOVERSION N/A 02/22/2018   Procedure: CARDIOVERSION;  Surgeon: Jolaine Artist, MD;  Location: [facility] ENDOSCOPY;  Service: Cardiovascular;  Laterality: N/A;  . CARDIOVERSION N/A 03/01/2018   Procedure: CARDIOVERSION;  Surgeon: Jolaine Artist, MD;  Location: [place];  Service: Cardiovascular;  Laterality: N/A;  . LAPAROSCOPIC CHOLECYSTECTOMY  2000  . RIGHT/LEFT HEART CATH AND CORONARY ANGIOGRAPHY N/A 02/20/2018   Procedure: RIGHT/LEFT HEART CATH AND  CORONARY ANGIOGRAPHY;  Surgeon: Jolaine Artist, MD;  Location: [place] CV LAB;  Service: Cardiovascular;  Laterality: N/A;  . US ECHOCARDIOGRAPHY  09/21/2009   EF 45-50%; Cavity size was severely dilated, severe concentric hypertrophy and normal wall motion    Home Medications:  Medications Prior to Admission  Medication Sig Dispense Refill Last Dose  . acetaminophen (TYLENOL) 325 MG tablet Take 1-2 tablets (325-650 mg total) by mouth every 6 (six) hours as needed for mild pain.     [name] allopurinol (ZYLOPRIM) 100 MG tablet TAKE 1 TABLET(100 MG) BY MOUTH DAILY 90 tablet 1   . amiodarone (PACERONE) 200 MG tablet Take 1 tablet (200 mg total) by mouth daily. 30 tablet 5   . Colchicine 0.6 MG CAPS Take 1 capsule by mouth 2 (two) times daily. 60 capsule 1   . hydrALAZINE (APRESOLINE) 100 MG tablet Take 1 tablet (100 mg total) by mouth 3 (three) times daily. 90 tablet 6   . isosorbide mononitrate (IMDUR) 30 MG 24 hr tablet Take 1 tablet (30 mg total) by mouth daily. 30 tablet 6   . losartan (COZAAR) 50 MG tablet Take 1 tablet (50 mg total) by mouth daily. Please cancel all previous orders for current medication. Change in dosage or pill size. 30 tablet 5   . metoprolol succinate (TOPROL-XL) 100 MG 24 hr tablet Take 1 tablet (100 mg total) by mouth daily. 30 tablet 5   . omega-3 acid ethyl esters (LOVAZA) 1 g capsule Take 2 capsules (2 g total) by mouth 2 (two) times daily.  120 capsule 11   . potassium chloride SA (KLOR-CON M20) 20 MEQ tablet Take 2 tablets (40 mEq total) by mouth daily. 60 tablet 5   . spironolactone (ALDACTONE) 25 MG tablet Take 0.5 tablets (12.5 mg total) by mouth daily. Please cancel all previous orders for current medication. Change in dosage or pill size. 15 tablet 5   . torsemide (DEMADEX) 20 MG tablet TAKE 2 TABLETS(40 MG) BY MOUTH EVERY MORNING. MAY ALSO TAKE 1 TABLET 20 MG TOTAL AS NEEDED FOR WEIGHT 343 OR GREATER 90 tablet 3    Allergies: No Known Allergies  Family  History  Problem Relation Age of Onset  . Hypertension Mother   . Pancreatic cancer Father   . Prostate cancer Father   . Colon cancer Father    Social History:  reports that he has quit smoking. He has never used smokeless tobacco. He reports previous alcohol use. He reports that he does not use drugs.  ROS: A complete review of systems was performed.  All systems are negative except for pertinent findings as noted. Review of Systems  Unable to perform ROS: Intubated     Physical Exam:  Vital signs in last 24 hours: Pulse Rate:  [99-35] 70 (11/21 1006) Resp:  [16-26] 20 (11/21 1006) BP: (111-194)/(64-94) 145/80 (11/21 1004) SpO2:  [93 %-100 %] 94 % (11/21 1006) FiO2 (%):  [60 %] 60 % (11/21 0905) General:  Intubated in OR  Cardiovascular: Regular rate and rhythm Lungs: Regular rate on vent  Abdomen: Soft, nontender, nondistended, no abdominal masses Extremities: No edema Neurologic: under gen. Anesthesia GU: Uncircumcised penis with a pinpoint opening and severe phimosis and scarring  Procedure: The penis was prepped with Betadine and draped in the usual sterile fashion.  The patient received cefazolin earlier.  I considered a dorsal slit but CRNA reported patient may still have anticoagulants in his system.  Therefore a sensor wire was opened and the cheater used to dilate the meatus.  I palpated the cheater into the urethral meatus and advanced the sensor wire into the bladder.  The wire was palpable in the urethra.  The Amplatz dilator kit was opened and the inner cannula passed over the wire.  Clear urine dripped from the cannula indicating proper placement in the bladder.  Over the wire and cannula the 5 myotic ring was dilated from 12-20 [place].  The cannula was removed leaving the wire in place and a 16 [place] council tip catheter advanced over the wire into the bladder.  The balloon was inflated and seated at the bladder neck.  The catheter was left to gravity drainage with  clear urine.  Laboratory Data:  Results for orders placed or performed during the hospital encounter of 04/03/2019 (from the past 24 hour(s))  CBG monitoring, ED     Status: Abnormal   Collection Time: 03/31/2019  8:13 AM  Result Value Ref Range   Glucose-Capillary 118 (H) 70 - 99 mg/dL  Protime-INR     Status: Abnormal   Collection Time: 03/29/2019  8:23 AM  Result Value Ref Range   Prothrombin Time 20.4 (H) 11.4 - 15.2 seconds   INR 1.8 (H) 0.8 - 1.2  APTT     Status: Abnormal   Collection Time: 04/05/2019  8:23 AM  Result Value Ref Range   aPTT 37 (H) 24 - 36 seconds  CBC     Status: None   Collection Time: 04/06/2019  8:23 AM  Result Value Ref Range   WBC 4.7  4.0 - 10.5 K/uL   RBC 4.57 4.22 - 5.81 MIL/uL   Hemoglobin 13.8 13.0 - 17.0 g/dL   HCT 43.6 39.0 - 52.0 %   MCV 95.4 80.0 - 100.0 fL   MCH 30.2 26.0 - 34.0 pg   MCHC 31.7 30.0 - 36.0 g/dL   RDW 14.0 11.5 - 15.5 %   Platelets 228 150 - 400 K/uL   nRBC 0.0 0.0 - 0.2 %  Differential     Status: None   Collection Time: 04/21/2019  8:23 AM  Result Value Ref Range   Neutrophils Relative % 50 %   Neutro Abs 2.4 1.7 - 7.7 K/uL   Lymphocytes Relative 32 %   Lymphs Abs 1.5 0.7 - 4.0 K/uL   Monocytes Relative 14 %   Monocytes Absolute 0.6 0.1 - 1.0 K/uL   Eosinophils Relative 3 %   Eosinophils Absolute 0.1 0.0 - 0.5 K/uL   Basophils Relative 1 %   Basophils Absolute 0.1 0.0 - 0.1 K/uL   Immature Granulocytes 0 %   Abs Immature Granulocytes 0.01 0.00 - 0.07 K/uL  Comprehensive metabolic panel     Status: Abnormal   Collection Time: 04/04/2019  8:23 AM  Result Value Ref Range   Sodium 141 135 - 145 mmol/L   Potassium 3.9 3.5 - 5.1 mmol/L   Chloride 104 98 - 111 mmol/L   CO2 29 22 - 32 mmol/L   Glucose, Bld 134 (H) 70 - 99 mg/dL   BUN 27 (H) 6 - 20 mg/dL   Creatinine, Ser 1.72 (H) 0.61 - 1.24 mg/dL   Calcium 8.7 (L) 8.9 - 10.3 mg/dL   Total Protein 6.4 (L) 6.5 - 8.1 g/dL   Albumin 3.3 (L) 3.5 - 5.0 g/dL   AST 21 15 - 41 U/L    ALT 21 0 - 44 U/L   Alkaline Phosphatase 89 38 - 126 U/L   Total Bilirubin 0.7 0.3 - 1.2 mg/dL   GFR calc non Af Amer 44 (L) >60 mL/min   GFR calc Af Amer 51 (L) >60 mL/min   Anion gap 8 5 - 15  I-stat chem 8, ED     Status: Abnormal   Collection Time: 04/03/2019  8:30 AM  Result Value Ref Range   Sodium 143 135 - 145 mmol/L   Potassium 3.9 3.5 - 5.1 mmol/L   Chloride 102 98 - 111 mmol/L   BUN 30 (H) 6 - 20 mg/dL   Creatinine, Ser 1.70 (H) 0.61 - 1.24 mg/dL   Glucose, Bld 126 (H) 70 - 99 mg/dL   Calcium, Ion 1.09 (L) 1.15 - 1.40 mmol/L   TCO2 30 22 - 32 mmol/L   Hemoglobin 14.6 13.0 - 17.0 g/dL   HCT 43.0 39.0 - 52.0 %  POC SARS Coronavirus 2 Ag-ED - Nasal Swab (BD Veritor Kit)     Status: None   Collection Time: 04/03/2019  8:45 AM  Result Value Ref Range   SARS Coronavirus 2 Ag NEGATIVE NEGATIVE  I-STAT 7, (LYTES, BLD GAS, ICA, H+H)     Status: Abnormal   Collection Time: 03/26/2019 10:08 AM  Result Value Ref Range   pH, Arterial 7.366 7.350 - 7.450   pCO2 arterial 55.7 (H) 32.0 - 48.0 mmHg   pO2, Arterial 73.0 (L) 83.0 - 108.0 mmHg   Bicarbonate 31.9 (H) 20.0 - 28.0 mmol/L   TCO2 34 (H) 22 - 32 mmol/L   O2 Saturation 93.0 %   Acid-Base Excess 5.0 (H) 0.0 -  2.0 mmol/L   Sodium 139 135 - 145 mmol/L   Potassium 3.6 3.5 - 5.1 mmol/L   Calcium, Ion 1.14 (L) 1.15 - 1.40 mmol/L   HCT 37.0 (L) 39.0 - 52.0 %   Hemoglobin 12.6 (L) 13.0 - 17.0 g/dL   Patient temperature 98.6 F    Collection site RADIAL, ALLEN'S TEST ACCEPTABLE    Drawn by Operator    Sample type ARTERIAL   ABO/Rh     Status: None   Collection Time: 04/03/2019 10:30 AM  Result Value Ref Range   ABO/RH(D)      A POS Performed at [facility] [address], [place], [place] [address]   Type and screen     Status: None (Preliminary result)   Collection Time: 04/05/2019 10:40 AM  Result Value Ref Range   ABO/RH(D) PENDING    Antibody Screen PENDING    Sample Expiration      04/17/2019,2359 Performed at [facility], [place] [address] [place], [place] [address]   Prepare RBC     Status: None   Collection Time: 03/29/2019 10:48 AM  Result Value Ref Range   Order Confirmation      ORDER PROCESSED BY BLOOD BANK Performed at [facility], [place] [address], [place], [place] [address]    No results found for this or any previous visit (from the past 240 hour(s)). Creatinine: Recent Labs    04/07/2019 0823 04/22/2019 0830  CREATININE 1.72* 1.70*    Impression/Assessment:  Severe phimosis -   Plan:  Status post difficult Foley catheter placement.  Catheter should remain until patient has some neurologic recovery and can stand or transfer to use a urinal or bedside commode.  I will sign off but will notify Dr. Karsten Ro of admission and potential follow-up in the future.  Please notify GU of any questions or concerns or changes in patient's status.  Festus Aloe 04/04/2019, 11:25 AM

## 2019-04-14 NOTE — Progress Notes (Signed)
  Echocardiogram 2D Echocardiogram has been performed.  Jay Chen 04/04/2019, 3:40 PM

## 2019-04-14 NOTE — Progress Notes (Signed)
Pharmacy: [facility] This patient met criteria for:  [ X ] Peripheral administration ICD-10-PCS Code: [id number]   _0  Dose ordered / given was 900 mg   _1  Dose ordered / given was 1800 mg     Jay Chen 04/13/2019  9:42 AM

## 2019-04-14 NOTE — Consult Note (Addendum)
NAME:  Jay Chen, MRN:  [id number], DOB:  01/29/1966, LOS: 0 ADMISSION DATE:  04/01/2019, CONSULTATION DATE:  04/02/2019  REFERRING MD:  Billy Fischer, CHIEF COMPLAINT:  ICH, respiratory failure  Brief History   53 yo M with ICH, intubated in ED.   History of present illness   53 yo M PMH CKD, CHF HTN HLD DM A fib on Xarelto, thoracic aortic aneurysm, Morbid obesity who presented to ED 04/03/2019 as code stroke. Last known normal 0715 04/13/09. Subsequently patient developed R sided weakness and eased self to floor. He developed slurred speech. Wife called EMS who also noted L facial droop and aphasia. Initial GCS 13. In ED, CT head reveals 49 cc Left ICH, mild edema and regional mass effect. Subsequent GCS 5; Patient intubated for airway protection. Mannitol given in ED and patient given factor Xa.   He will be taken to OR emergently for evacuation with NSGY. Neurology is primary and PCCM will consult for vent management  Past Medical History  Atrial fibrillation on xarelto  Obesity OSA HTN HLD DM CHF CKD Gout Gallstones Thoracic aortic aneurysm  Significant Hospital Events   11/21> ICH. Intubated. To OR emergently.   Consults:  PCCM NSGY  Procedures:  ETT 11/21>>>  Significant Diagnostic Tests:  11/21 CT Head> L cerebral ICH 5.7 x 4.1 x 4.2 cm. Local mass effect. Mild edema.   Micro Data:  04/07/2019 SARS CoV2> neg  Antimicrobials:    Interim history/subjective:  Decerebrate posturing upon exam in ED Is intubated and on low-dose propofol   Objective   Height 5\' 11"  (1.803 m).    Vent Mode: PRVC FiO2 (%):  [60 %] 60 % Set Rate:  [16 bmp] 16 bmp Vt Set:  [600 mL] 600 mL PEEP:  [5 cmH20] 5 cmH20 Plateau Pressure:  [20 cmH20] 20 cmH20  No intake or output data in the 24 hours ending 04/21/2019 0945 There were no vitals filed for this visit.  Examination: General: Morbidly obese, critically ill appearing M, intubated in NAD HENT: NCAT. Anicteric sclera. ETT  secure. Pink mmm Lungs: Diminished but CTA. Symmetrical chest expansion Cardiovascular: RRR s1s2 no rgm. Cap recill < 3 seconds Abdomen: Obese, soft, ndnt. + bowel sounds Extremities: Symmetrical bulka dn tone. Trace BLE edema. No cyanosis or clubbing Neuro: Examined on low-dose propofol. Pupils 23mm very sluggish. Extensor posturing BUE BLE.  GU: defer  Resolved Hospital Problem list     Assessment & Plan:   Acute respiratory failure requiring intubation -in setting of ICH Hx OSA P -When out of OR,  ABG -PEEP/FiO2 for goal > 92% -Pulm hygiene  - AM CXR -propofol/fentanyl for sedation  Large L temporal ICH -OR with NSGY 11/21 P -Plan per neuro/NSGY -s/p andexxa (home xarelto for afib) -BP goal will be < 140 -decision about hypertonic per neuro -no antiplt or anticoag; SCD only -further imagine per neuro  HTN Afib on home xarelto -hold anticoag given ICH. Now s/p factor Xa before OR -continue telemetry  -Rate control PRN -BP goal as above. May require cleviprex but currently ok without   DM -SSI  CHF -ECHO  HLD -hold statins  CKD -trend renal indices and electrolytes -patient is not a dialysis patient; if needs hypertonic saline >24 hrs would consider PICC vs CVC    Best practice:  Diet: NPO Pain/Anxiety/Delirium protocol (if indicated): prop VAP protocol (if indicated): yes DVT prophylaxis: SCD GI prophylaxis: protonix Glucose control: SSI Mobility: BR Code Status: Full  Family Communication: per primary  Disposition: Will be admitted by stroke service to ICU with PCCM consulting for vent management   Labs   CBC: Recent Labs  Lab 03/25/2019 0823 03/26/2019 0830  WBC 4.7  --   NEUTROABS 2.4  --   HGB 13.8 14.6  HCT 43.6 43.0  MCV 95.4  --   PLT 228  --     Basic Metabolic Panel: Recent Labs  Lab 04/04/2019 0823 04/06/2019 0830  NA 141 143  K 3.9 3.9  CL 104 102  CO2 29  --   GLUCOSE 134* 126*  BUN 27* 30*  CREATININE 1.72* 1.70*   CALCIUM 8.7*  --    GFR: CrCl cannot be calculated (Unknown ideal weight.). Recent Labs  Lab 04/21/2019 0823  WBC 4.7    Liver Function Tests: Recent Labs  Lab 04/18/2019 0823  AST 21  ALT 21  ALKPHOS 89  BILITOT 0.7  PROT 6.4*  ALBUMIN 3.3*   No results for input(s): LIPASE, AMYLASE in the last 168 hours. No results for input(s): AMMONIA in the last 168 hours.  ABG    Component Value Date/Time   PHART 7.489 (H) 02/20/2018 1713   PCO2ART 50.9 (H) 02/20/2018 1713   PO2ART 59.0 (L) 02/20/2018 1713   HCO3 39.0 (H) 02/20/2018 1718   TCO2 30 03/27/2019 0830   O2SAT 65.0 02/20/2018 1718     Coagulation Profile: Recent Labs  Lab 04/18/2019 0823  INR 1.8*    Cardiac Enzymes: No results for input(s): CKTOTAL, CKMB, CKMBINDEX, TROPONINI in the last 168 hours.  HbA1C: Hemoglobin A1C  Date/Time Value Ref Range Status  01/24/2018 02:07 PM 7.1 (A) 4.0 - 5.6 % Final   Hgb A1c MFr Bld  Date/Time Value Ref Range Status  09/20/2018 09:45 AM 6.2 4.6 - 6.5 % Final    Comment:    Glycemic Control Guidelines for People with Diabetes:Non Diabetic:  <6%Goal of Therapy: <7%Additional Action Suggested:  >8%   01/01/2018 07:31 AM 7.8 (H) 4.8 - 5.6 % Final    Comment:    (NOTE)         Prediabetes: 5.7 - 6.4         Diabetes: >6.4         Glycemic control for adults with diabetes: <7.0     CBG: Recent Labs  Lab 04/23/2019 0813  GLUCAP 118*    Review of Systems:   Unable to obtain- ICH, intubated.   Past Medical History  He,  has a past medical history of CKD (chronic kidney disease), stage III ([place]), Congestive heart failure ([place]) (May of 2011), Cor pulmonale (chronic) ([facility]), Gallstones, Gout, History of kidney stones, Hyperlipidemia, Hypertension, Morbid obesity ([place]), Persistent atrial fibrillation, Sleep apnea, Thoracic aneurysm, Thoracic aortic aneurysm ([place]), and Type II diabetes mellitus ([place]).   Surgical History    Past Surgical History:  Procedure Laterality Date   . CARDIOVERSION N/A 02/22/2018   Procedure: CARDIOVERSION;  Surgeon: Jolaine Artist, MD;  Location: [facility] ENDOSCOPY;  Service: Cardiovascular;  Laterality: N/A;  . CARDIOVERSION N/A 03/01/2018   Procedure: CARDIOVERSION;  Surgeon: Jolaine Artist, MD;  Location: [facility];  Service: Cardiovascular;  Laterality: N/A;  . LAPAROSCOPIC CHOLECYSTECTOMY  2000  . RIGHT/LEFT HEART CATH AND CORONARY ANGIOGRAPHY N/A 02/20/2018   Procedure: RIGHT/LEFT HEART CATH AND CORONARY ANGIOGRAPHY;  Surgeon: Jolaine Artist, MD;  Location: [facility] CV LAB;  Service: Cardiovascular;  Laterality: N/A;  . US ECHOCARDIOGRAPHY  09/21/2009   EF 45-50%; Cavity size was severely dilated, severe  concentric hypertrophy and normal wall motion     Social History   reports that he has quit smoking. He has never used smokeless tobacco. He reports previous alcohol use. He reports that he does not use drugs.   Family History   His family history includes Colon cancer in his father; Hypertension in his mother; Pancreatic cancer in his father; Prostate cancer in his father.   Allergies No Known Allergies   Home Medications  Prior to Admission medications   Medication Sig Start Date End Date Taking? Authorizing Provider  acetaminophen (TYLENOL) 325 MG tablet Take 1-2 tablets (325-650 mg total) by mouth every 6 (six) hours as needed for mild pain. 03/01/18   Love, Ivan Anchors, PA-C  allopurinol (ZYLOPRIM) 100 MG tablet TAKE 1 TABLET(100 MG) BY MOUTH DAILY 11/28/18   [name], MD  amiodarone (PACERONE) 200 MG tablet Take 1 tablet (200 mg total) by mouth daily. 08/31/18   Georgiana Shore, NP  aspirin 81 MG EC tablet Take 1 tablet (81 mg total) by mouth daily. 02/23/18   Clegg, Amy D, NP  atorvastatin (LIPITOR) 80 MG tablet Take 1 tablet (80 mg total) by mouth daily. 03/14/19   Bensimhon, Shaune Pascal, MD  Colchicine 0.6 MG CAPS Take 1 capsule by mouth 2 (two) times daily. 02/14/19   [name], MD  hydrALAZINE  (APRESOLINE) 100 MG tablet Take 1 tablet (100 mg total) by mouth 3 (three) times daily. 09/29/18   Clegg, Amy D, NP  isosorbide mononitrate (IMDUR) 30 MG 24 hr tablet Take 1 tablet (30 mg total) by mouth daily. 09/29/18   Clegg, Amy D, NP  losartan (COZAAR) 50 MG tablet Take 1 tablet (50 mg total) by mouth daily. Please cancel all previous orders for current medication. Change in dosage or pill size. 11/13/18   Bensimhon, Shaune Pascal, MD  metoprolol succinate (TOPROL-XL) 100 MG 24 hr tablet Take 1 tablet (100 mg total) by mouth daily. 08/31/18   Georgiana Shore, NP  omega-3 acid ethyl esters (LOVAZA) 1 g capsule Take 2 capsules (2 g total) by mouth 2 (two) times daily. 01/16/18   Angiulli, Lavon Paganini, PA-C  potassium chloride SA (KLOR-CON M20) 20 MEQ tablet Take 2 tablets (40 mEq total) by mouth daily. 08/31/18   Georgiana Shore, NP  spironolactone (ALDACTONE) 25 MG tablet Take 0.5 tablets (12.5 mg total) by mouth daily. Please cancel all previous orders for current medication. Change in dosage or pill size. 08/31/18 11/29/18  Georgiana Shore, NP  torsemide (DEMADEX) 20 MG tablet TAKE 2 TABLETS(40 MG) BY MOUTH EVERY MORNING. MAY ALSO TAKE 1 TABLET 20 MG TOTAL AS NEEDED FOR WEIGHT 343 OR GREATER 11/08/18   Clegg, Amy D, NP  XARELTO 20 MG TABS tablet TAKE 1 TABLET(20 MG) BY MOUTH DAILY WITH SUPPER 11/08/18   Larey Dresser, MD     Critical care time: 45 minutes      Eliseo Gum MSN, AGACNP-BC [place] [id number] If no answer, [id number] 04/16/2019, 11:34 AM  Attending Note:  53 year old male with history of a-fib and HTN on xarelto who presents with a left temporal ICH that was intubated due to inability to protect his airway.  On exam, patient is on propofol and unresponsive, posturing with clear lungs.  I reviewed CXR myself, ETT is in a good position.  Discussed with PCCM-NP.  Will go to the OR for evacuation of the hematoma.  ABG upon return from the OR and  adjust vent  accordingly.  Rate control.  Reverse anti-coagulation.  Propofol and fentanyl for sedation.  BP control per neuro.  PCCM will continue to follow.  The patient is critically ill with multiple organ systems failure and requires high complexity decision making for assessment and support, frequent evaluation and titration of therapies, application of advanced monitoring technologies and extensive interpretation of multiple databases.   Critical Care Time devoted to patient care services described in this note is  38  Minutes. This time reflects time of care of this signee Dr Jennet Maduro. This critical care time does not reflect procedure time, or teaching time or supervisory time of PA/NP/Med student/Med Resident etc but could involve care discussion time.  Rush Farmer, M.D. [facility] Pulmonary/Critical Care Medicine.

## 2019-04-14 NOTE — Code Documentation (Signed)
Code Stroke  I met the patient at the bridge in the ED, patient had a LT sided gaze, RT sided weakness, dysarthria, and aphasic. + Snoring respirations. Per EDP - airway cleared for CT. Patient did not have adequate IV access, went to CT. CT + LT Temporal ICH, CTA was ordered, it took several minutes to secure a working 20 G PIV, during this time, patient started having what appeared to be seizure like activity on the RT side. Patient was no longer as awake as he was once upon arrival. Remained quite hypertensive but since IV access was hard to establish, patient did not received any BP medications. CTA completed and patient taken back to ED for urgent intubation. Patient started exhibiting signs of decerebrate posturing as well. GCS now 5. RSI completed using Etomidate 40 mg IV. No paralytic was given. SBP improved to 110-130 range after intubation, Propofol was started at 4mg/kg/min and Andexxa Reversal was given as soon as second PIV was placed. I placed a 3rd PIV as well and administer Ativan 2 mg IV and Mannitol 25 mg IV over 15 minutes. Decision made for patient to go for craniotomy. Patient taken to OR by ED RN, RT, and myself.    Start Time [id number] Time 1115

## 2019-04-14 NOTE — Progress Notes (Signed)
Verbal order from urology to maintain foley for duration of admission. Verified by neurosurgery and ccm.

## 2019-04-14 NOTE — ED Triage Notes (Signed)
EMS reported Vitals 210/124, HR 60, SPO2 95% RA.

## 2019-04-14 NOTE — Anesthesia Procedure Notes (Signed)
Central Venous Catheter Insertion Performed by: Roberts Gaudy, MD, anesthesiologist Start/End11/06/2018 10:35 AM, 04/14/2019 10:45 AM Patient location: Pre-op. Preanesthetic checklist: patient identified, IV checked, site marked, risks and benefits discussed, surgical consent, monitors and equipment checked, pre-op evaluation, timeout performed and anesthesia consent Lidocaine 1% used for infiltration and patient sedated Hand hygiene performed  and maximum sterile barriers used  Catheter size: 8 Fr Total catheter length 16. Central line was placed.Double lumen Procedure performed using ultrasound guided technique. Ultrasound Notes:image(s) printed for medical record Attempts: 1 Following insertion, dressing applied and line sutured. Post procedure assessment: blood return through all ports  Patient tolerated the procedure well with no immediate complications.

## 2019-04-14 NOTE — Progress Notes (Signed)
Subjective: Pt. Arrives to 4N, intubated, sedated, not requiring pressors. S/p crani, hematoma evacuation with Dr. Christella Noa, complex foley placement with Dr. Junious Silk.  Objective: Vital signs in last 24 hours: Pulse Rate:  [58-68] 62 (11/21 1006) Resp:  [16-26] 20 (11/21 1006) BP: (111-194)/(64-94) 145/80 (11/21 1004) SpO2:  [93 %-100 %] 95 % (11/21 1248) FiO2 (%):  [60 %] 60 % (11/21 1248)  Intake/Output from previous day: No intake/output data recorded. Intake/Output this shift: Total I/O In: 700 [I.V.:700] Out: 300 [Blood:300]  Neurologic: Mental status: intubated, sedated Resp: clear to auscultation bilaterally Cardio: regular rate and rhythm, S1, S2 normal, no murmur, click, rub or gallop Extremities: edema 1-2+ to ankles Wound: C/D/I  Lab Results: Recent Labs    04/15/2019 0823 04/04/2019 0830 04/19/2019 1008  WBC 4.7  --   --   HGB 13.8 14.6 12.6*  HCT 43.6 43.0 37.0*  PLT 228  --   --    BMET Recent Labs    04/03/2019 0823 04/19/2019 0830 04/10/2019 1008  NA 141 143 139  K 3.9 3.9 3.6  CL 104 102  --   CO2 29  --   --   GLUCOSE 134* 126*  --   BUN 27* 30*  --   CREATININE 1.72* 1.70*  --   CALCIUM 8.7*  --   --     Studies/Results: Ct Code Stroke Cta Head W/wo Contrast  Result Date: 04/23/2019 CLINICAL DATA:  Focal neural deficit with stroke suspected. EXAM: CT ANGIOGRAPHY HEAD TECHNIQUE: Multidetector CT imaging of the head was performed using the standard protocol during bolus administration of intravenous contrast. Multiplanar CT image reconstructions and MIPs were obtained to evaluate the vascular anatomy. CONTRAST:  65mL OMNIPAQUE IOHEXOL 350 MG/ML SOLN COMPARISON:  Head CT from earlier today.  Brain MRA 01/01/2018 FINDINGS: CTA HEAD Anterior circulation: Vessels are smooth and widely patent. No aneurysm or vascular malformation seen. At the level of the hemorrhage there are 4 contrast dense areas compatible with spot sign. Posterior circulation: Right  vertebral artery dominance. Mild atherosclerotic calcification of the right vertebral artery. Hypoplastic left P1 segment. No branch occlusion, beading, or aneurysm. Venous sinuses: Limited assessment given arterial timing. No evident thrombosis. Anatomic variants: As above IMPRESSION: 1. No evidence of aneurysm, vascular malformation, or vasculitis. 2. There are areas of CTA spot sign which increase changes of hematoma growth. Electronically Signed   By: Monte Fantasia M.D.   On: 04/11/2019 09:12   Dg Chest Portable 1 View  Result Date: 04/03/2019 CLINICAL DATA:  Status post intubation. EXAM: PORTABLE CHEST 1 VIEW COMPARISON:  03/24/2018 FINDINGS: 0952 hours. Rotated low volume film. Endotracheal tube tip is 3.7 cm above the base of the carina. The cardio pericardial silhouette is enlarged. Vascular congestion with interstitial pulmonary edema noted. Bibasilar collapse/consolidation. IMPRESSION: Endotracheal tube tip 3.7 cm above the carina. Low volume film with cardiomegaly, vascular congestion and interstitial pulmonary edema. Bibasilar collapse/consolidation, left greater than right. Electronically Signed   By: Misty Stanley M.D.   On: 04/01/2019 10:25   Ct Head Code Stroke Wo Contrast  Result Date: 04/06/2019 CLINICAL DATA:  Code stroke.  Right-sided weakness.  02/08/2015 EXAM: CT HEAD WITHOUT CONTRAST TECHNIQUE: Contiguous axial images were obtained from the base of the skull through the vertex without intravenous contrast. COMPARISON:  03/02/2018 brain MRI FINDINGS: Brain: 5.7 x 4.1 x 4.2 cm heterogeneous hematoma in the white matter of the left temporal lobe, subinsular region, and Corona radiata/external capsule. Mild/early edema. Local mass effect is  mild considering the size of hemorrhage. No hydrocephalus or intraventricular extension. No adjacent infarct is noted. Vascular: Atherosclerotic calcification. Skull: Negative Sinuses/Orbits: Negative Other: Critical Value/emergent results were  called by telephone at the time of interpretation on 04/06/2019 at 8:30 am to providerLindzen , who verbally acknowledged these results. ASPECTS [facility] Stroke Program Early CT Score) Not scored in this setting IMPRESSION: 49 cc hematoma in the left cerebral white matter as described. White matter infarct and small lobar micro hemorrhages were seen on a 2019 brain MRI. Electronically Signed   By: Monte Fantasia M.D.   On: 04/09/2019 08:33    Assessment/Plan: Pt. With multiple medical comorbidities,  Now s/p ICH with crani/evacuation    LOS: 0 days  CXR and ABG to eval and change vent parameters as needed. post op CBC and BMP for eval, repletion as needed. Will continue to follow   I have independently seen and examined the patient, reviewed data, and developed an assessment and plan with the APP. A total of 33 minutes were spent in critical care assessment and medical decision making. This critical care time does not reflect procedure time, or teaching time or supervisory time of PA/NP/Med student/Med Resident, etc but could involve care discussion time.  Bonna Gains 04/09/2019, 1:59 PM

## 2019-04-14 NOTE — ED Triage Notes (Signed)
Pt's  Wife and brother in room  At bedside to see Pt before going to OR.

## 2019-04-14 NOTE — Progress Notes (Signed)
Chaplain responded to page for family support for Kaspar's wife Adela Lank. Shelia's phone # is [phone]. Chaplain had prayer with [facility] after leading her from ED waiting to ED consult space B. Chaplain privided water and assistance in information gathering, and navigating the hospital. Chaplain escorted [place] (Brysyn's brother) to visit with him briefly prior to surgery. Then Chaplain escorted them to the surgical waiting area on the 2nd floor. Chaplain informed Adela Lank and Fritz Pickerel of locations for [place].   Adela Lank and Ah have two boys: Gareth Eagle who is [place] who is 15y (Dealing with depression, may benefit from video call to Arlo's room in ICU once Vincil is stable.)  Hazem's brother Karle Barr is here in the waiting area to support Lowell's wife Adela Lank.   Tj's mother's name is Safir Taulbee.  Chaplain remains available for support as needs arise. Pager [phone] on-call.  Chaplain Resident, Evelene Croon, M Div

## 2019-04-14 NOTE — H&P (Signed)
Admission H&P    Chief Complaint: Acute onset of right sided weakness and dysphasia  HPI: Jay Chen is an 53 y.o. male with prior history of small left temporal lobe stroke in 2019, old microhemorrhages bilaterally on prior brain MRI, morbid obesity, a-fib with RVR on Xarelto, CKD, systolicheart failure with prior EF of 20 to 25% and most recent echo with EF of 50-55% in June, hypertension, diabetes, hyperlipidemia, thoracic aortic aneurysm and OSA, presenting with acute onset of right hemiplegia and dysphasia. LKN was 0715. He was seen normal by family, walking, then suddenly collapsed and was unable to get up, with right sided weakness. Family called EMS who noted the same deficits on arrival. CBG en route was 146. On arrival to the ED the patient had sonorous respirations and decreased level of consciousness. He could follow commands but had difficulty verbalizing with dysarthric, fragmentary speech. CT head reveals a large left temporal lobe acute hemorrhage.    Past Medical History:  Diagnosis Date  . CKD (chronic kidney disease), stage III ([place])   . Congestive heart failure [facility]) May of 2011   Felt to have cor pulmonale; EF 45 to 50% from echo in May 2011  . Cor pulmonale (chronic) ([place])   . Gallstones   . Gout    "take daily RX" (02/15/2018)  . History of kidney stones   . Hyperlipidemia   . Hypertension   . Morbid obesity ([place])   . Persistent atrial fibrillation    [name] 12/28/2017  . Sleep apnea    "dx'd; couldn't tolerate CPAP" (02/15/2018)  . Thoracic aneurysm    a. 4.8cm thoracic aortic aneurysm by CT 2013.  [name] Thoracic aortic aneurysm ([place])    known/notes 12/28/2017  . Type II diabetes mellitus ([place])     Past Surgical History:  Procedure Laterality Date  . CARDIOVERSION N/A 02/22/2018   Procedure: CARDIOVERSION;  Surgeon: Jolaine Artist, MD;  Location: [facility] ENDOSCOPY;  Service: Cardiovascular;  Laterality: N/A;  . CARDIOVERSION N/A 03/01/2018   Procedure:  CARDIOVERSION;  Surgeon: Jolaine Artist, MD;  Location: [place];  Service: Cardiovascular;  Laterality: N/A;  . LAPAROSCOPIC CHOLECYSTECTOMY  2000  . RIGHT/LEFT HEART CATH AND CORONARY ANGIOGRAPHY N/A 02/20/2018   Procedure: RIGHT/LEFT HEART CATH AND CORONARY ANGIOGRAPHY;  Surgeon: Jolaine Artist, MD;  Location: [place] CV LAB;  Service: Cardiovascular;  Laterality: N/A;  . US ECHOCARDIOGRAPHY  09/21/2009   EF 45-50%; Cavity size was severely dilated, severe concentric hypertrophy and normal wall motion    Family History  Problem Relation Age of Onset  . Hypertension Mother   . Pancreatic cancer Father   . Prostate cancer Father   . Colon cancer Father    Social History:  reports that he has quit smoking. He has never used smokeless tobacco. He reports previous alcohol use. He reports that he does not use drugs.  Allergies: No Known Allergies   ROS: Unable to obtain due to AMS.   Physical Examination: There were no vitals taken for this visit.  HEENT-  [place]/AT  Heart: RRR  Lungs - Sonorous respirations grossly audible. Breath sounds clear to auscultation after intubation.  Abdomen - Round with significant adiposity Extremities - Distal lower extremity edema noted.   Neurologic Examination: Performed after CT and before intubation.  Mental Status: Decreased level of consciousness. GCS 5 (decreased from GCS of 13 on arrival). No verbal output. LUE will remain elevated after passively raised, but not following commands. Eyes closed.  Cranial Nerves:  II:  PERRL. Will not deviate eyes towards or away from visual stimuli.  III,IV, VI: Eyes closed. Eyes mildly exotropic. Suppressed doll's eye reflex.  V,VII: Right facial droop. Decreased response to noxious brow ridge pressure bilaterally. VIII: Hearing intact on arrival. Now not responding to verbal stimuli IX,X: Unable to visualize palate XI: Head tends to rotate to left.  XII: Did not protrude tongue to command.   Motor/Sensory: RUE flaccid. Drops to bed immediately after being passively raised. 0/5. RLE decreased tone. Weak flexion at ankle/knee/hip with noxious plantar stimulation LUE Remains elevated after being passively raised. Not following commands for detailed testing.  LLE withdraws to noxious briskly  After intubation, patient exhibiting extensor posturing intermittently of BUE.  Deep Tendon Reflexes:  Brisk low amplitude reflexes throughout, more brisk on right than left.  Plantars: Right: Upgoing   Left: downgoing Cerebellar/Gait: Unable to assess    Results for orders placed or performed during the hospital encounter of 04/05/2019 (from the past 48 hour(s))  CBG monitoring, ED     Status: Abnormal   Collection Time: 03/31/2019  8:13 AM  Result Value Ref Range   Glucose-Capillary 118 (H) 70 - 99 mg/dL  I-stat chem 8, ED     Status: Abnormal   Collection Time: 04/20/2019  8:30 AM  Result Value Ref Range   Sodium 143 135 - 145 mmol/L   Potassium 3.9 3.5 - 5.1 mmol/L   Chloride 102 98 - 111 mmol/L   BUN 30 (H) 6 - 20 mg/dL   Creatinine, Ser 1.70 (H) 0.61 - 1.24 mg/dL   Glucose, Bld 126 (H) 70 - 99 mg/dL   Calcium, Ion 1.09 (L) 1.15 - 1.40 mmol/L   TCO2 30 22 - 32 mmol/L   Hemoglobin 14.6 13.0 - 17.0 g/dL   HCT 43.0 39.0 - 52.0 %   Echocardiogram 11/13/18:  1. The left ventricle has low normal systolic function, with an ejection fraction of 50-55%. The cavity size was mildly dilated. There is mildly increased left ventricular wall thickness. Left ventricular diastolic Doppler parameters are consistent with  impaired relaxation.  2. The right ventricle has normal systolic function. The cavity was normal. There is no increase in right ventricular wall thickness.  3. Left atrial size was moderately dilated.  4. There is moderate dilatation of the ascending aorta measuring 47 mm.  5. The interatrial septum was not well visualized.   Assessment: 53 year old male with history of small left  temporal lobe stroke in 2019, old microhemorrhages bilaterally on prior brain MRI, morbid obesity, a-fib with RVR on Xarelto, CKD, systolicheart failure with prior EF of 20 to 25% and most recent echo with EF of 50-55% in June, hypertension, diabetes, hyperlipidemia, thoracic aortic aneurysm and OSA, presenting with acute onset of right hemiplegia and dysphasia. CT head reveals a large left temporal lobe acute hemorrhage.  1. Acutely deteriorated after CT.  2. CTA head: No evidence of aneurysm, vascular malformation, or vasculitis. There are areas of CTA spot sign which increase changes of hematoma growth.  3. Emergently intubated by EDP.  4. Neurosurgery has evaluated the patient, taking patient to the OR.  5. Administered Andexxa. Appreciate pharmacy assistance  Plan:  1. Admitting to the ICU under the Neurology team after surgery 2. Frequent neuro checks and BP management with SBP goal of < 140 3. Repeat CT head at 5 PM, or earlier if patient is noted to be worsening  May need hypertonic saline if CT reveals an indication post-surgery.  4. No antiplatelet medications or anticoagulants. DVT prophylaxis with SCDs 5. MRI brain 6. Repeat echocardiogram to follow up the study from June  -- History of cervical spinal cord compression with lumbar radiculopathy, managed during his 2019 admission. Monitor.   -- History of small left temporal lobe stroke, embolic -likely due to atrial fibrillation. Diagnosed in 2019.  Repeating echocardiogram this admission. No LVO seen on CTA done this admission.   -- A. fib with RVR. Monitor. Continue home dose of PO amiodarone.   -- Cardiomyopathy. May need to consult Cardiology pending echocardiogram. Last EF in June was 50-55%. Given hypotension in ED, holding off on his PO antihypertensives. He is currently being treated with amiodarone and titratable clevidipine drip has been ordered.   -- Hypertension. On past admission, BP was low at times. Now low in  the ED after intubation with propofol sedation.  -- Hyperlipidemia. Hold off on statin given worse outcomes in hemorrhage patients taking statins, per the literature.   -- Diabetes. SSI. CBG monitoring. HgbA1c.   -- CKD. Monitor.   -- OSA. After extubation, may need CPAP or BiPAP.  90 minutes spent in the emergent neurological evaluation and management of this critically ill patient.    Electronically signed: Dr. Kerney Elbe 03/31/2019, 8:35 AM

## 2019-04-15 ENCOUNTER — Inpatient Hospital Stay (HOSPITAL_COMMUNITY): Payer: BC Managed Care – PPO

## 2019-04-15 DIAGNOSIS — I629 Nontraumatic intracranial hemorrhage, unspecified: Secondary | ICD-10-CM

## 2019-04-15 LAB — POCT I-STAT 7, (LYTES, BLD GAS, ICA,H+H)
Acid-Base Excess: 1 mmol/L (ref 0.0–2.0)
Acid-Base Excess: 2 mmol/L (ref 0.0–2.0)
Acid-Base Excess: 2 mmol/L (ref 0.0–2.0)
Bicarbonate: 27.3 mmol/L (ref 20.0–28.0)
Bicarbonate: 27.6 mmol/L (ref 20.0–28.0)
Bicarbonate: 26.8 mmol/L (ref 20.0–28.0)
Calcium, Ion: 1.16 mmol/L (ref 1.15–1.40)
Calcium, Ion: 1.14 mmol/L — ABNORMAL LOW (ref 1.15–1.40)
Calcium, Ion: 1.11 mmol/L — ABNORMAL LOW (ref 1.15–1.40)
HCT: 40 % (ref 39.0–52.0)
HCT: 40 % (ref 39.0–52.0)
HCT: 36 % — ABNORMAL LOW (ref 39.0–52.0)
Hemoglobin: 13.6 g/dL (ref 13.0–17.0)
Hemoglobin: 13.6 g/dL (ref 13.0–17.0)
Hemoglobin: 12.2 g/dL — ABNORMAL LOW (ref 13.0–17.0)
O2 Saturation: 90 %
O2 Saturation: 92 %
O2 Saturation: 95 %
Patient temperature: 97.6
Patient temperature: 99
Potassium: 3.4 mmol/L — ABNORMAL LOW (ref 3.5–5.1)
Potassium: 3.8 mmol/L (ref 3.5–5.1)
Potassium: 3.7 mmol/L (ref 3.5–5.1)
Sodium: 141 mmol/L (ref 135–145)
Sodium: 141 mmol/L (ref 135–145)
Sodium: 141 mmol/L (ref 135–145)
TCO2: 29 mmol/L (ref 22–32)
TCO2: 29 mmol/L (ref 22–32)
TCO2: 28 mmol/L (ref 22–32)
pCO2 arterial: 47 mmHg (ref 32.0–48.0)
pCO2 arterial: 45.5 mmHg (ref 32.0–48.0)
pCO2 arterial: 44 mmHg (ref 32.0–48.0)
pH, Arterial: 7.369 (ref 7.350–7.450)
pH, Arterial: 7.392 (ref 7.350–7.450)
pH, Arterial: 7.394 (ref 7.350–7.450)
pO2, Arterial: 59 mmHg — ABNORMAL LOW (ref 83.0–108.0)
pO2, Arterial: 66 mmHg — ABNORMAL LOW (ref 83.0–108.0)
pO2, Arterial: 75 mmHg — ABNORMAL LOW (ref 83.0–108.0)

## 2019-04-15 LAB — CBC WITH DIFFERENTIAL/PLATELET
Abs Immature Granulocytes: 0.13 10*3/uL — ABNORMAL HIGH (ref 0.00–0.07)
Basophils Absolute: 0 10*3/uL (ref 0.0–0.1)
Basophils Relative: 0 %
Eosinophils Absolute: 0 10*3/uL (ref 0.0–0.5)
Eosinophils Relative: 0 %
HCT: 41 % (ref 39.0–52.0)
Hemoglobin: 13.5 g/dL (ref 13.0–17.0)
Immature Granulocytes: 1 %
Lymphocytes Relative: 4 %
Lymphs Abs: 0.8 10*3/uL (ref 0.7–4.0)
MCH: 31 pg (ref 26.0–34.0)
MCHC: 32.9 g/dL (ref 30.0–36.0)
MCV: 94 fL (ref 80.0–100.0)
Monocytes Absolute: 1.8 10*3/uL — ABNORMAL HIGH (ref 0.1–1.0)
Monocytes Relative: 9 %
Neutro Abs: 16.9 10*3/uL — ABNORMAL HIGH (ref 1.7–7.7)
Neutrophils Relative %: 86 %
Platelets: 190 10*3/uL (ref 150–400)
RBC: 4.36 MIL/uL (ref 4.22–5.81)
RDW: 14 % (ref 11.5–15.5)
WBC: 19.6 10*3/uL — ABNORMAL HIGH (ref 4.0–10.5)
nRBC: 0 % (ref 0.0–0.2)

## 2019-04-15 LAB — BASIC METABOLIC PANEL
Anion gap: 13 (ref 5–15)
BUN: 30 mg/dL — ABNORMAL HIGH (ref 6–20)
CO2: 26 mmol/L (ref 22–32)
Calcium: 8.8 mg/dL — ABNORMAL LOW (ref 8.9–10.3)
Chloride: 102 mmol/L (ref 98–111)
Creatinine, Ser: 1.75 mg/dL — ABNORMAL HIGH (ref 0.61–1.24)
GFR calc Af Amer: 50 mL/min — ABNORMAL LOW (ref >60)
GFR calc non Af Amer: 43 mL/min — ABNORMAL LOW (ref >60)
Glucose, Bld: 165 mg/dL — ABNORMAL HIGH (ref 70–99)
Potassium: 3.5 mmol/L (ref 3.5–5.1)
Sodium: 141 mmol/L (ref 135–145)

## 2019-04-15 LAB — TRIGLYCERIDES: Triglycerides: 227 mg/dL — ABNORMAL HIGH (ref <150)

## 2019-04-15 LAB — GLUCOSE, CAPILLARY
Glucose-Capillary: 113 mg/dL — ABNORMAL HIGH (ref 70–99)
Glucose-Capillary: 135 mg/dL — ABNORMAL HIGH (ref 70–99)
Glucose-Capillary: 137 mg/dL — ABNORMAL HIGH (ref 70–99)
Glucose-Capillary: 139 mg/dL — ABNORMAL HIGH (ref 70–99)
Glucose-Capillary: 141 mg/dL — ABNORMAL HIGH (ref 70–99)
Glucose-Capillary: 162 mg/dL — ABNORMAL HIGH (ref 70–99)

## 2019-04-15 LAB — MAGNESIUM: Magnesium: 1.6 mg/dL — ABNORMAL LOW (ref 1.7–2.4)

## 2019-04-15 LAB — PHOSPHORUS: Phosphorus: 4.8 mg/dL — ABNORMAL HIGH (ref 2.5–4.6)

## 2019-04-15 MED ORDER — MAGNESIUM SULFATE 50 % IJ SOLN: 2.0000 g | Freq: Once | INTRAMUSCULAR | Status: DC

## 2019-04-15 MED ORDER — MAGNESIUM SULFATE 2 GM/50ML IV SOLN
2.0000 g | Freq: Once | INTRAVENOUS | Status: AC
  Administered 2019-04-15: 2 g via INTRAVENOUS
  Filled 2019-04-15: qty 50

## 2019-04-15 NOTE — Progress Notes (Signed)
Ppeak mid-20s on PEEP 10 Increase PEEP to 14; ABG to follow  5:15 PM

## 2019-04-15 NOTE — Progress Notes (Signed)
CT and MRI reviewed. S/p hematoma evacuation left hemisphere. Hold off on HTS for now. Will repeat CTH in the AM and decide if edema worsens. For now local mass effect on the ventricle without any signs of impending herniation.  -- Amie Portland, MD [facility] Pager: [phone] If 7pm to 7am, please call on call as listed on AMION.

## 2019-04-15 NOTE — Progress Notes (Signed)
OT Cancellation Note  Patient Details Name: Jay Chen MRN: [id number] DOB: Jul 13, 1965   Cancelled Treatment:    Reason Eval/Treat Not Completed: Active bedrest order(Will return as schedule allows.)  [place] MSOT, OTR/L Acute Rehab Pager: [phone] Office: [phone] 04/15/2019, 7:17 AM

## 2019-04-15 NOTE — Progress Notes (Signed)
SLP Cancellation Note  Patient Details Name: Jay Chen MRN: [id number] DOB: 20-Mar-1966   Cancelled treatment:       Reason Eval/Treat Not Completed: Patient not medically ready.  Pt remains intubated at this time per chart review.    Elvia Collum Dagny Fiorentino 04/15/2019, 7:36 AM

## 2019-04-15 NOTE — Progress Notes (Signed)
Notified Arora of pupil changes. L pupil is now a 1 and R remains a 2. Both are still reactive. Rest of neuro exam remains unchanged. MRI completed 2 hours prior, Rory Percy reviewed results. No new orders at this time.  WCTM

## 2019-04-15 NOTE — Progress Notes (Addendum)
Patient ID: Jay Chen, male   DOB: 01-28-1966, 53 y.o.   MRN: [id number] BP 130/67   Pulse 73   Temp 97.6 F (36.4 C) (Axillary)   Resp 18   Ht 5\' 11"  (1.803 m)   SpO2 96%   BMI 50.52 kg/m  Intubated, heavily sedated and with narcotics MRI revealed good clot evacuation, decreased shift though minimal No underlying lesion AVM, aneurysm, or other vascular malformation No cough, gag, pupils pinpoint no reaction No response to noxious stimuli No new recommendations, continue supportive care.

## 2019-04-15 NOTE — Consult Note (Signed)
NAME:  Jay Chen, MRN:  [id number], DOB:  22-Feb-1966, LOS: 1 ADMISSION DATE:  04/21/2019, CONSULTATION DATE:  04/21/2019  REFERRING MD:  Billy Fischer, CHIEF COMPLAINT:  ICH, respiratory failure  Brief History   53 yo M with ICH, intubated in ED.   History of present illness   53 yo M PMH CKD, CHF HTN HLD DM A fib on Xarelto, thoracic aortic aneurysm, Morbid obesity who presented to ED 04/11/2019 as code stroke. Last known normal 0715 04/13/09. Subsequently patient developed R sided weakness and eased self to floor. He developed slurred speech. Wife called EMS who also noted L facial droop and aphasia. Initial GCS 13. In ED, CT head reveals 49 cc Left ICH, mild edema and regional mass effect. Subsequent GCS 5; Patient intubated for airway protection. Mannitol given in ED and patient given factor Xa.   Ttaken to OR emergently for evacuation with NSGY. Neurology is primary and PCCM will consult for vent management  Past Medical History  Atrial fibrillation on xarelto  Obesity OSA HTN HLD DM CHF CKD Gout Gallstones Thoracic aortic aneurysm  Significant Hospital Events   11/21> ICH. Intubated. To OR emergently.   Consults:  PCCM NSGY  Procedures:  ETT 11/21>>> Crani 11/21>>  Significant Diagnostic Tests:  11/21 CT Head> L cerebral ICH 5.7 x 4.1 x 4.2 cm. Local mass effect. Mild edema.  11/22 MRI Head>>  Micro Data:  04/10/2019 SARS CoV2> neg  Antimicrobials:    Interim history/subjective:  Decerebrate posturing upon exam in ED; neuro exam unchanged Is intubated and on low-dose propofol   Objective   Blood pressure 125/65, pulse 80, temperature 97.6 F (36.4 C), temperature source Axillary, resp. rate 18, height 5\' 11"  (1.803 m), SpO2 97 %.    Vent Mode: PRVC FiO2 (%):  [60 %-80 %] 80 % Set Rate:  [16 bmp] 16 bmp Vt Set:  [580 mL-600 mL] 580 mL PEEP:  [5 cmH20-10 cmH20] 10 cmH20 Plateau Pressure:  [21 cmH20-29 cmH20] 25 cmH20   Intake/Output Summary (Last 24  hours) at 04/15/2019 1051 Last data filed at 04/15/2019 1000 Gross per 24 hour  Intake 2179.54 ml  Output 1625 ml  Net 554.54 ml   There were no vitals filed for this visit.  Examination: General: Morbidly obese, critically ill appearing M, intubated in NAD HENT: [place] L temporal C/D/I;  Anicteric sclera. ETT secure. Pink mmm Lungs: Diminished but CTA. Symmetrical chest expansion Cardiovascular: RRR s1s2 no rgm. Cap recill < 2 seconds Abdomen: Obese, soft, ndnt. + bowel sounds Extremities: Symmetrical bulka dn tone. Trace BLE edema. No cyanosis or clubbing Neuro: Pupils 62mm very sluggish; no corneal reflexes; mildly upward gaze, conjugate; mild cough/gag, no w/d to stim; shaking, intermittent posturing with nox. stim. Extensor posturing BUE BLE.  GU: foley in place; urine dark/concentrated appearing, not cloudy  Resolved Hospital Problem list     Assessment & Plan:   Acute respiratory failure requiring intubation -in setting of ICH Hx OSA P -When out of OR,  ABG -PEEP/FiO2 for goal > 92% -Pulm hygiene  - AM CXR -propofol/fentanyl for sedation  Large L temporal ICH -OR with NSGY 11/21 P -Plan per neuro/NSGY -s/p andexxa (home xarelto for afib) -BP goal will be < 140 -decision about hypertonic per neuro -no antiplt or anticoag; SCD only -further imagine per neuro -Monitor urine output, osmolarity for any development of DI  HTN -hold anticoag given ICH. Now s/p factor Xa before OR -continue telemetry  -Rate control PRN -BP goal as  above. May require cleviprex but currently ok without   DM -SSI  CHF -ECHO  HLD -hold statins  CKD -trend renal indices and electrolytes; Cr stable at 1.75 so far. -patient is not a dialysis patient; if needs hypertonic saline >24 hrs would consider PICC vs CVC    Best practice:  Diet: NPO Pain/Anxiety/Delirium protocol (if indicated): prop VAP protocol (if indicated): yes DVT prophylaxis: SCD GI prophylaxis:  protonix Glucose control: SSI Mobility: BR Code Status: Full  Family Communication: per primary Disposition: Will be admitted by stroke service to ICU with PCCM consulting for vent management   Labs   CBC: Recent Labs  Lab 04/09/2019 0823  03/29/2019 1008 04/05/2019 1430 04/04/2019 1514 04/15/19 0803 04/15/19 0903  WBC 4.7  --   --  6.4  --  19.6*  --   NEUTROABS 2.4  --   --   --   --  16.9*  --   HGB 13.8   < > 12.6* 12.8* 12.2* 13.5 13.6  HCT 43.6   < > 37.0* 38.6* 36.0* 41.0 40.0  MCV 95.4  --   --  93.5  --  94.0  --   PLT 228  --   --  190  --  190  --    < > = values in this interval not displayed.    Basic Metabolic Panel: Recent Labs  Lab 04/01/2019 0823 04/08/2019 0830 04/05/2019 1008 04/19/2019 1430 04/21/2019 1514 04/15/19 0540 04/15/19 0903  NA 141 143 139 140 141 141 141  K 3.9 3.9 3.6 4.0 4.0 3.5 3.4*  CL 104 102  --  106  --  102  --   CO2 29  --   --  28  --  26  --   GLUCOSE 134* 126*  --  152*  --  165*  --   BUN 27* 30*  --  28*  --  30*  --   CREATININE 1.72* 1.70*  --  1.73*  --  1.75*  --   CALCIUM 8.7*  --   --  8.5*  --  8.8*  --   MG  --   --   --   --   --  1.6*  --    GFR: CrCl cannot be calculated (Unknown ideal weight.). Recent Labs  Lab 04/02/2019 0823 04/16/2019 1430 04/15/19 0803  WBC 4.7 6.4 19.6*    Liver Function Tests: Recent Labs  Lab 04/18/2019 0823  AST 21  ALT 21  ALKPHOS 89  BILITOT 0.7  PROT 6.4*  ALBUMIN 3.3*   No results for input(s): LIPASE, AMYLASE in the last 168 hours. No results for input(s): AMMONIA in the last 168 hours.  ABG    Component Value Date/Time   PHART 7.369 04/15/2019 0903   PCO2ART 47.0 04/15/2019 0903   PO2ART 59.0 (L) 04/15/2019 0903   HCO3 27.3 04/15/2019 0903   TCO2 29 04/15/2019 0903   O2SAT 90.0 04/15/2019 0903     Coagulation Profile: Recent Labs  Lab 03/25/2019 0823  INR 1.8*    Cardiac Enzymes: No results for input(s): CKTOTAL, CKMB, CKMBINDEX, TROPONINI in the last 168 hours.   HbA1C: Hgb A1c MFr Bld  Date/Time Value Ref Range Status  03/26/2019 02:30 PM 5.8 (H) 4.8 - 5.6 % Final    Comment:    (NOTE) Pre diabetes:          5.7%-6.4% Diabetes:              >  6.4% Glycemic control for   <7.0% adults with diabetes   09/20/2018 09:45 AM 6.2 4.6 - 6.5 % Final    Comment:    Glycemic Control Guidelines for People with Diabetes:Non Diabetic:  <6%Goal of Therapy: <7%Additional Action Suggested:  >8%     CBG: Recent Labs  Lab 04/08/2019 1517 03/28/2019 1926 04/13/2019 2315 04/15/19 0327 04/15/19 0754  GLUCAP 152* 141* 141* 139* 162*    Review of Systems:   Unable to obtain- ICH, intubated.   Past Medical History  He,  has a past medical history of CKD (chronic kidney disease), stage III ([place]), Congestive heart failure ([place]) (May of 2011), Cor pulmonale (chronic) ([facility]), Gallstones, Gout, History of kidney stones, Hyperlipidemia, Hypertension, Morbid obesity ([place]), Persistent atrial fibrillation, Sleep apnea, Thoracic aneurysm, Thoracic aortic aneurysm ([place]), and Type II diabetes mellitus ([place]).   Surgical History    Past Surgical History:  Procedure Laterality Date  . CARDIOVERSION N/A 02/22/2018   Procedure: CARDIOVERSION;  Surgeon: Jolaine Artist, MD;  [facility] ENDOSCOPY;  Service: Cardiovascular;  Laterality: N/A;  . CARDIOVERSION N/A 03/01/2018   Procedure: CARDIOVERSION;  Surgeon: Jolaine Artist, MD;  Location: [place];  Service: Cardiovascular;  Laterality: N/A;  . LAPAROSCOPIC CHOLECYSTECTOMY  2000  . RIGHT/LEFT HEART CATH AND CORONARY ANGIOGRAPHY N/A 02/20/2018   Procedure: RIGHT/LEFT HEART CATH AND CORONARY ANGIOGRAPHY;  Surgeon: Jolaine Artist, MD;  Location: [place] CV LAB;  Service: Cardiovascular;  Laterality: N/A;  . US ECHOCARDIOGRAPHY  09/21/2009   EF 45-50%; Cavity size was severely dilated, severe concentric hypertrophy and normal wall motion     Social History   reports that he has quit smoking. He has never used  smokeless tobacco. He reports previous alcohol use. He reports that he does not use drugs.   Family History   His family history includes Colon cancer in his father; Hypertension in his mother; Pancreatic cancer in his father; Prostate cancer in his father.   Allergies No Known Allergies   Home Medications  Prior to Admission medications   Medication Sig Start Date End Date Taking? Authorizing Provider  acetaminophen (TYLENOL) 325 MG tablet Take 1-2 tablets (325-650 mg total) by mouth every 6 (six) hours as needed for mild pain. 03/01/18   Love, Ivan Anchors, PA-C  allopurinol (ZYLOPRIM) 100 MG tablet TAKE 1 TABLET(100 MG) BY MOUTH DAILY 11/28/18   [name], MD  amiodarone (PACERONE) 200 MG tablet Take 1 tablet (200 mg total) by mouth daily. 08/31/18   Georgiana Shore, NP  aspirin 81 MG EC tablet Take 1 tablet (81 mg total) by mouth daily. 02/23/18   Clegg, Amy D, NP  atorvastatin (LIPITOR) 80 MG tablet Take 1 tablet (80 mg total) by mouth daily. 03/14/19   Bensimhon, Shaune Pascal, MD  Colchicine 0.6 MG CAPS Take 1 capsule by mouth 2 (two) times daily. 02/14/19   [name], MD  hydrALAZINE (APRESOLINE) 100 MG tablet Take 1 tablet (100 mg total) by mouth 3 (three) times daily. 09/29/18   Clegg, Amy D, NP  isosorbide mononitrate (IMDUR) 30 MG 24 hr tablet Take 1 tablet (30 mg total) by mouth daily. 09/29/18   Clegg, Amy D, NP  losartan (COZAAR) 50 MG tablet Take 1 tablet (50 mg total) by mouth daily. Please cancel all previous orders for current medication. Change in dosage or pill size. 11/13/18   Bensimhon, Shaune Pascal, MD  metoprolol succinate (TOPROL-XL) 100 MG 24 hr tablet Take 1 tablet (100 mg total)  by mouth daily. 08/31/18   Georgiana Shore, NP  omega-3 acid ethyl esters (LOVAZA) 1 g capsule Take 2 capsules (2 g total) by mouth 2 (two) times daily. 01/16/18   Angiulli, Lavon Paganini, PA-C  potassium chloride SA (KLOR-CON M20) 20 MEQ tablet Take 2 tablets (40 mEq total) by mouth daily. 08/31/18   Georgiana Shore, NP  spironolactone (ALDACTONE) 25 MG tablet Take 0.5 tablets (12.5 mg total) by mouth daily. Please cancel all previous orders for current medication. Change in dosage or pill size. 08/31/18 11/29/18  Georgiana Shore, NP  torsemide (DEMADEX) 20 MG tablet TAKE 2 TABLETS(40 MG) BY MOUTH EVERY MORNING. MAY ALSO TAKE 1 TABLET 20 MG TOTAL AS NEEDED FOR WEIGHT 343 OR GREATER 11/08/18   Clegg, Amy D, NP  XARELTO 20 MG TABS tablet TAKE 1 TABLET(20 MG) BY MOUTH DAILY WITH SUPPER 11/08/18   Larey Dresser, MD     Critical care time: 40 minutes       I have independently seen and examined the patient, reviewed data, and developed an assessment and plan. A total of 40 minutes were spent in critical care assessment and medical decision making. This critical care time does not reflect procedure time, or teaching time or supervisory time of PA/NP/Med student/Med Resident, etc but could involve care discussion time.   Bonna Gains, MD PhD 04/15/19 11:01 AM

## 2019-04-15 NOTE — Progress Notes (Signed)
Ventilator changes made ~10 a.m. in response to ABG; pt to CT uneventfully.  ABG now to eval.  Updated wife at bedside, questions answered  Bonna Gains, MD PhD 04/15/19 2:00 PM

## 2019-04-15 NOTE — Progress Notes (Signed)
PT Cancellation Note  Patient Details Name: Jay Chen MRN: [id number] DOB: [birth year]   Cancelled Treatment:    Reason Eval/Treat Not Completed: Active bedrest order Will await increase in activity orders prior to PT evaluation. Will follow.   [name] 04/15/2019, 6:58 AM Marisa Severin, PT, DPT Acute Rehabilitation Services Pager [phone] Office [phone]

## 2019-04-15 NOTE — Progress Notes (Signed)
STROKE TEAM PROGRESS NOTE   HISTORY OF PRESENT ILLNESS (per record) Jay Chen is an 53 y.o. male with prior history of small left temporal lobe stroke in 2019, old microhemorrhages bilaterally on prior brain MRI, morbid obesity, a-fib with RVR on Xarelto, CKD, systolicheart failure with prior EF of 20 to 25% and most recent echo with EF of 50-55% in June, hypertension, diabetes, hyperlipidemia, thoracic aortic aneurysm and OSA, presenting with acute onset of right hemiplegia and dysphasia. LKN was 0715. He was seen normal by family, walking, then suddenly collapsed and was unable to get up, with right sided weakness. Family called EMS who noted the same deficits on arrival. CBG en route was 146. On arrival to the ED the patient had sonorous respirations and decreased level of consciousness. He could follow commands but had difficulty verbalizing with dysarthric, fragmentary speech. CT head reveals a large left temporal lobe acute hemorrhage.    INTERVAL HISTORY Patient is intubated and on sedation    OBJECTIVE Vitals:   04/15/19 0430 04/15/19 0445 04/15/19 0500 04/15/19 0515  BP: 140/81 133/86 136/81 132/83  Pulse: 71 70 69 71  Resp: 16 16 16 16   Temp:      TempSrc:      SpO2: 99% 99% 99% 99%  Height:        CBC:  Recent Labs  Lab 04/20/2019 0823  04/12/2019 1430 04/10/2019 1514  WBC 4.7  --  6.4  --   NEUTROABS 2.4  --   --   --   HGB 13.8   < > 12.8* 12.2*  HCT 43.6   < > 38.6* 36.0*  MCV 95.4  --  93.5  --   PLT 228  --  190  --    < > = values in this interval not displayed.    Basic Metabolic Panel:  Recent Labs  Lab 04/20/2019 0823 04/06/2019 0830  04/03/2019 1430 04/01/2019 1514  NA 141 143   < > 140 141  K 3.9 3.9   < > 4.0 4.0  CL 104 102  --  106  --   CO2 29  --   --  28  --   GLUCOSE 134* 126*  --  152*  --   BUN 27* 30*  --  28*  --   CREATININE 1.72* 1.70*  --  1.73*  --   CALCIUM 8.7*  --   --  8.5*  --    < > = values in this interval not displayed.     Lipid Panel:     Component Value Date/Time   CHOL 116 01/01/2018 0159   TRIG 85 01/01/2018 0159   HDL 24 (L) 01/01/2018 0159   CHOLHDL 4.8 01/01/2018 0159   VLDL 17 01/01/2018 0159   LDLCALC 75 01/01/2018 0159   HgbA1c:  Lab Results  Component Value Date   HGBA1C 5.8 (H) 04/10/2019   Urine Drug Screen: No results found for: LABOPIA, COCAINSCRNUR, LABBENZ, AMPHETMU, THCU, LABBARB  Alcohol Level No results found for: ETH  IMAGING  Ct Code Stroke Cta Head W/wo Contrast 04/08/2019 IMPRESSION:  1. No evidence of aneurysm, vascular malformation, or vasculitis.  2. There are areas of CTA spot sign which increase changes of hematoma growth.   Ct Head Code Stroke Wo Contrast 04/13/2019 IMPRESSION:  49 cc hematoma in the left cerebral white matter as described. White matter infarct and small lobar micro hemorrhages were seen on a 2019 brain MRI.   Mr Brain  Wo Contrast 04/15/2019 IMPRESSION:  1. Postoperative changes from interval left temporal craniotomy for hematoma evacuation. Previously seen large left temporal lobe intraparenchymal hemorrhage has largely been evacuated, with residual blood products seen within the resection cavity. Persistent localized edema and regional mass effect with trace 1-2 mm of left-to-right shift.  2. Associated intraventricular extension with small volume hemorrhage throughout the ventricular system. No hydrocephalus or ventricular trapping.  3. Small postoperative extra-axial collection and pneumocephalus overlying the left frontotemporal convexity without significant mass effect.    CT Head WO Contrast - pending 04/15/2019   Dg Chest Port 1 View 03/26/2019 IMPRESSION:  1. New right IJ central venous catheter with the tip overlying the mid SVC.  2. No evidence of pneumothorax or other complication.  3. Overall improved aeration with decreased right basilar atelectasis and decreased interstitial edema versus vascular crowding.  4.  Persistent dense left basilar airspace opacity.   Dg Chest Portable 1 View 04/19/2019 IMPRESSION:  Endotracheal tube tip 3.7 cm above the carina. Low volume film with cardiomegaly, vascular congestion and interstitial pulmonary edema. Bibasilar collapse/consolidation, left greater than right.    Transthoracic Echocardiogram  04/20/2019 IMPRESSIONS  1. Left ventricular ejection fraction, by visual estimation, is 55 to 60%. The left ventricle has normal function. There is severely increased left ventricular hypertrophy.  2. Elevated left atrial pressure.  3. Left ventricular diastolic parameters are consistent with Grade II diastolic dysfunction (pseudonormalization).  4. The left ventricle has no regional wall motion abnormalities.  5. Global right ventricle has normal systolic function.The right ventricular size is normal. No increase in right ventricular wall thickness.  6. Left atrial size was mildly dilated.  7. Right atrial size was normal.  8. Small pericardial effusion.  9. The pericardial effusion is circumferential. 10. The mitral valve is normal in structure. No evidence of mitral valve regurgitation. 11. The tricuspid valve is normal in structure. Tricuspid valve regurgitation is not demonstrated. 12. The aortic valve is normal in structure. Aortic valve regurgitation is trivial. 13. The pulmonic valve was normal in structure. Pulmonic valve regurgitation is not visualized. 14. Aortic dilatation noted. 15. There is mild dilatation of the aortic root measuring 42 mm. 16. TR signal is inadequate for assessing pulmonary artery systolic pressure. 17. The inferior vena cava is dilated in size with <50% respiratory variability, suggesting right atrial pressure of 15 mmHg.   ECG - SR rate 61 BPM. (See cardiology reading for complete details)   PHYSICAL EXAM Blood pressure 132/83, pulse 71, temperature 98.3 F (36.8 C), temperature source Axillary, resp. rate 16, height 5\' 11"   (1.803 m), SpO2 99 %.  Intubated and sedated on propofol  Eyes closed Does not follow commands No response to verbal stimuli Pupils are 62mm and reactive b/l +corneals +cough  + gag +VOR's Minimal motor movement to deep sternal rub, some movement in the b/l feet and attempted localization in the LUE, no movement in the RUE      ASSESSMENT/PLAN Jay Chen is a 53 y.o. male with history of small left temporal lobe stroke in 2019, old microhemorrhages bilaterally on prior brain MRI, morbid obesity, a-fib with RVR on Xarelto, CKD, systolicheart failure with prior EF of 20 to 25% and most recent echo with EF of 50-55% in June, hypertension, diabetes, hyperlipidemia, thoracic aortic aneurysm and OSA, presenting with acute onset of right hemiplegia and dysphasia. He did not receive IV t-PA due to [place].  Stroke: large left temporal lobe acute hemorrhage  CT head - 49  cc hematoma in the left cerebral white matter as described. White matter infarct and small lobar micro hemorrhages were seen on a 2019 brain MRI.   Repeat CT Head 04/15/19 - pending  MRI head - Postoperative changes from interval left temporal craniotomy for hematoma evacuation. Previously seen large left temporal lobe intraparenchymal hemorrhage has largely been evacuated, with residual blood products seen within the resection cavity. Persistent localized edema and regional mass effect with trace 1-2 mm of left-to-right shift. Associated intraventricular extension with small volume hemorrhage throughout the ventricular system.  MRA head - not ordered  Code Stroke CTA Head - No evidence of aneurysm, vascular malformation, or vasculitis. There are areas of CTA spot sign which increase changes of hematoma growth.   CT Perfusion - not ordered  Carotid Doppler - not indicated  2D Echo - EF 60 - 65%. No cardiac source of emboli identified.   [employer] Virus 2 - negative  LDL - 75  HgbA1c - 5.8  UDS - not  ordered  VTE prophylaxis - SCDs Diet  Diet Order            Diet NPO time specified  Diet effective now              Xarelto (rivaroxaban) daily prior to admission, now on No antithrombotic  Ongoing aggressive stroke risk factor management  Therapy recommendations:  pending  Disposition:  Pending  Hypertension  Home BP meds: Hydralazine ; Cozaar ; Metoprolol  Current BP meds: Cleviprex  Stable . SBP goal - 120 - 140 range for the first 24 hours. Then < 160 mmHg. [name] Long-term BP goal normotensive  Hyperlipidemia  Home Lipid lowering medication: Lipitor 80 mg daily  LDL 75, goal < 70  Current lipid lowering medication: none   Continue statin at discharge  Diabetes  Home diabetic meds: none   Current diabetic meds: Insulin  HgbA1c 5.8, goal < 7.0 Recent Labs    04/05/2019 1926 04/08/2019 2315 04/15/19 0327  GLUCAP 141* 141* 139*    Cerebral edema and regional mass effect   Mannitol - 25 g x 1 - 04/13/2019  Temporal Craniotomy - Dr Christella Noa - 04/06/2019  Seizure prophylaxis -> Keppra  Repeat Head CT - pending  Consider hypertonic therapy to keep Na 150-155, will await repeat CTH to assess shift   Other Stroke Risk Factors  Former cigarette smoker - quit  Previous ETOH use  Obesity, Body mass index is 50.52 kg/m., recommend weight loss, diet and exercise as appropriate   Hx stroke/TIA  Obstructive sleep apnea, not on CPAP at home  Congestive Heart Failure  Atrial fibrillation - Xarelto PTA   Other Active Problems  Difficult foley placement -> Dr Junious Silk - leave foley in place  Intubated - CCM on board  CKD - creatinine - 1.73  Pupillary changes - MRI reviewed by Dr Rory Percy  Paroxysmal Atrial fibrillation - Xarelto PTA -[facility] day # 1  Agree with plan as above   To contact Stroke Continuity provider, please refer to [email or web address] After hours, contact General Neurology

## 2019-04-15 NOTE — Progress Notes (Signed)
RT NOTE: RT transported patient with RN from 4N25 to CT and back. No complications. VS stable. RT will continue to monitor.

## 2019-04-16 ENCOUNTER — Inpatient Hospital Stay (HOSPITAL_COMMUNITY): Payer: BC Managed Care – PPO

## 2019-04-16 ENCOUNTER — Encounter (HOSPITAL_COMMUNITY): Payer: Self-pay | Admitting: Neurosurgery

## 2019-04-16 ENCOUNTER — Other Ambulatory Visit: Payer: Self-pay

## 2019-04-16 DIAGNOSIS — G935 Compression of brain: Secondary | ICD-10-CM | POA: Diagnosis not present

## 2019-04-16 DIAGNOSIS — G936 Cerebral edema: Secondary | ICD-10-CM | POA: Diagnosis present

## 2019-04-16 LAB — GLUCOSE, CAPILLARY
Glucose-Capillary: 104 mg/dL — ABNORMAL HIGH (ref 70–99)
Glucose-Capillary: 110 mg/dL — ABNORMAL HIGH (ref 70–99)
Glucose-Capillary: 123 mg/dL — ABNORMAL HIGH (ref 70–99)
Glucose-Capillary: 127 mg/dL — ABNORMAL HIGH (ref 70–99)
Glucose-Capillary: 134 mg/dL — ABNORMAL HIGH (ref 70–99)
Glucose-Capillary: 143 mg/dL — ABNORMAL HIGH (ref 70–99)
Glucose-Capillary: 77 mg/dL (ref 70–99)

## 2019-04-16 LAB — POCT I-STAT 7, (LYTES, BLD GAS, ICA,H+H)
Acid-Base Excess: 5 mmol/L — ABNORMAL HIGH (ref 0.0–2.0)
Acid-Base Excess: 1 mmol/L (ref 0.0–2.0)
Bicarbonate: 31.7 mmol/L — ABNORMAL HIGH (ref 20.0–28.0)
Bicarbonate: 25.8 mmol/L (ref 20.0–28.0)
Calcium, Ion: 1.14 mmol/L — ABNORMAL LOW (ref 1.15–1.40)
Calcium, Ion: 1.1 mmol/L — ABNORMAL LOW (ref 1.15–1.40)
HCT: 37 % — ABNORMAL LOW (ref 39.0–52.0)
HCT: 34 % — ABNORMAL LOW (ref 39.0–52.0)
Hemoglobin: 12.6 g/dL — ABNORMAL LOW (ref 13.0–17.0)
Hemoglobin: 11.6 g/dL — ABNORMAL LOW (ref 13.0–17.0)
O2 Saturation: 99 %
O2 Saturation: 96 %
Patient temperature: 36
Patient temperature: 99.3
Potassium: 3.9 mmol/L (ref 3.5–5.1)
Potassium: 3.6 mmol/L (ref 3.5–5.1)
Sodium: 142 mmol/L (ref 135–145)
Sodium: 140 mmol/L (ref 135–145)
TCO2: 33 mmol/L — ABNORMAL HIGH (ref 22–32)
TCO2: 27 mmol/L (ref 22–32)
pCO2 arterial: 52 mmHg — ABNORMAL HIGH (ref 32.0–48.0)
pCO2 arterial: 43.4 mmHg (ref 32.0–48.0)
pH, Arterial: 7.389 (ref 7.350–7.450)
pH, Arterial: 7.385 (ref 7.350–7.450)
pO2, Arterial: 138 mmHg — ABNORMAL HIGH (ref 83.0–108.0)
pO2, Arterial: 83 mmHg (ref 83.0–108.0)

## 2019-04-16 LAB — TRIGLYCERIDES: Triglycerides: 130 mg/dL (ref <150)

## 2019-04-16 LAB — BASIC METABOLIC PANEL
Anion gap: 10 (ref 5–15)
BUN: 35 mg/dL — ABNORMAL HIGH (ref 6–20)
CO2: 26 mmol/L (ref 22–32)
Calcium: 8.1 mg/dL — ABNORMAL LOW (ref 8.9–10.3)
Chloride: 104 mmol/L (ref 98–111)
Creatinine, Ser: 2.6 mg/dL — ABNORMAL HIGH (ref 0.61–1.24)
GFR calc Af Amer: 31 mL/min — ABNORMAL LOW (ref >60)
GFR calc non Af Amer: 27 mL/min — ABNORMAL LOW (ref >60)
Glucose, Bld: 142 mg/dL — ABNORMAL HIGH (ref 70–99)
Potassium: 3.8 mmol/L (ref 3.5–5.1)
Sodium: 140 mmol/L (ref 135–145)

## 2019-04-16 LAB — MAGNESIUM
Magnesium: 2 mg/dL (ref 1.7–2.4)
Magnesium: 2 mg/dL (ref 1.7–2.4)

## 2019-04-16 LAB — PHOSPHORUS: Phosphorus: 5.4 mg/dL — ABNORMAL HIGH (ref 2.5–4.6)

## 2019-04-16 MED ORDER — METOPROLOL TARTRATE 25 MG/10 ML ORAL SUSPENSION
50.0000 mg | Freq: Two times a day (BID) | ORAL | Status: DC
  Administered 2019-04-16 – 2019-04-23 (×15): 50 mg
  Filled 2019-04-16 (×15): qty 20

## 2019-04-16 MED ORDER — SENNOSIDES-DOCUSATE SODIUM 8.6-50 MG PO TABS
1.0000 | ORAL_TABLET | Freq: Two times a day (BID) | ORAL | Status: DC
  Administered 2019-04-16 – 2019-04-21 (×9): 1
  Filled 2019-04-16 (×9): qty 1

## 2019-04-16 MED ORDER — VITAL HIGH PROTEIN PO LIQD
1000.0000 mL | ORAL | Status: DC
  Administered 2019-04-16 – 2019-04-22 (×8): 1000 mL

## 2019-04-16 MED ORDER — LABETALOL HCL 5 MG/ML IV SOLN: 20.0000 mg | INTRAVENOUS | Status: DC | PRN

## 2019-04-16 MED ORDER — ALLOPURINOL 100 MG PO TABS
100.0000 mg | ORAL_TABLET | Freq: Every day | ORAL | Status: DC
  Administered 2019-04-17 – 2019-04-23 (×7): 100 mg
  Filled 2019-04-16 (×10): qty 1

## 2019-04-16 MED ORDER — FUROSEMIDE 10 MG/ML IJ SOLN
100.0000 mg | Freq: Once | INTRAVENOUS | Status: AC
  Administered 2019-04-16: 100 mg via INTRAVENOUS
  Filled 2019-04-16: qty 10

## 2019-04-16 MED ORDER — AMIODARONE HCL 200 MG PO TABS
200.0000 mg | ORAL_TABLET | Freq: Every day | ORAL | Status: DC
  Administered 2019-04-17 – 2019-04-23 (×7): 200 mg
  Filled 2019-04-16 (×7): qty 1

## 2019-04-16 MED ORDER — PANTOPRAZOLE SODIUM 40 MG PO PACK
40.0000 mg | PACK | Freq: Every day | ORAL | Status: DC
  Administered 2019-04-16 – 2019-04-23 (×8): 40 mg
  Filled 2019-04-16 (×8): qty 20

## 2019-04-16 MED ORDER — AMIODARONE IV BOLUS ONLY 150 MG/100ML
150.0000 mg | Freq: Once | INTRAVENOUS | Status: AC
  Administered 2019-04-16: 150 mg via INTRAVENOUS
  Filled 2019-04-16: qty 100

## 2019-04-16 MED ORDER — PRO-STAT SUGAR FREE PO LIQD
60.0000 mL | Freq: Every day | ORAL | Status: DC
  Administered 2019-04-16 – 2019-04-23 (×36): 60 mL
  Filled 2019-04-16 (×35): qty 60

## 2019-04-16 MED ORDER — ADULT MULTIVITAMIN W/MINERALS CH
1.0000 | ORAL_TABLET | Freq: Every day | ORAL | Status: DC
  Administered 2019-04-16 – 2019-04-23 (×8): 1
  Filled 2019-04-16 (×8): qty 1

## 2019-04-16 MED ORDER — HYDRALAZINE HCL 20 MG/ML IJ SOLN: 20.0000 mg | Freq: Four times a day (QID) | INTRAMUSCULAR | Status: DC | PRN

## 2019-04-16 NOTE — Progress Notes (Signed)
STROKE TEAM PROGRESS NOTE   INTERVAL HISTORY Patient remains sedated with propofol and intubated for respiratory failure and remains tachycardic.  He is barely responsive to sternal rub with purposeful left-sided movements and trace movement in the right leg with intermittent posturing of the right upper extremity.  CT scan of the head this morning shows stable appearance of the left temporal hematoma with postoperative changes with some air in the frontal regions with mild cytotoxic edema and 2 mm right to left shift.  Blood pressure is adequately controlled.  OBJECTIVE Vitals:   04/16/19 0645 04/16/19 0700 04/16/19 0800 04/16/19 0811  BP: 102/75 118/78 115/79   Pulse: (!) 38 84 (!) 139 (!) 131  Resp: 16 16 19  (!) 21  Temp:      TempSrc:      SpO2: 98% 97% 91% 94%  Height:        CBC:  Recent Labs  Lab 04/07/2019 0823  04/03/2019 1430  04/15/19 0803  04/15/19 2025 04/16/19 0329  WBC 4.7  --  6.4  --  19.6*  --   --   --   NEUTROABS 2.4  --   --   --  16.9*  --   --   --   HGB 13.8   < > 12.8*   < > 13.5   < > 12.2* 11.6*  HCT 43.6   < > 38.6*   < > 41.0   < > 36.0* 34.0*  MCV 95.4  --  93.5  --  94.0  --   --   --   PLT 228  --  190  --  190  --   --   --    < > = values in this interval not displayed.   Basic Metabolic Panel:  Recent Labs  Lab 04/15/19 0540  04/15/19 1336  04/16/19 0238 04/16/19 0329  NA 141   < >  --    < > 140 140  K 3.5   < >  --    < > 3.8 3.6  CL 102  --   --   --  104  --   CO2 26  --   --   --  26  --   GLUCOSE 165*  --   --   --  142*  --   BUN 30*  --   --   --  35*  --   CREATININE 1.75*  --   --   --  2.60*  --   CALCIUM 8.8*  --   --   --  8.1*  --   MG 1.6*  --   --   --  2.0  --   PHOS  --   --  4.8*  --   --   --    < > = values in this interval not displayed.   Lipid Panel:     Component Value Date/Time   CHOL 116 01/01/2018 0159   TRIG 130 04/16/2019 0238   HDL 24 (L) 01/01/2018 0159   CHOLHDL 4.8 01/01/2018 0159   VLDL 17  01/01/2018 0159   LDLCALC 75 01/01/2018 0159   HgbA1c:  Lab Results  Component Value Date   HGBA1C 5.8 (H) 03/31/2019   Urine Drug Screen: No results found for: LABOPIA, COCAINSCRNUR, LABBENZ, AMPHETMU, THCU, LABBARB  Alcohol Level No results found for: [place] Code Stroke Wo Contrast 04/02/2019 IMPRESSION:  49 cc hematoma in  the left cerebral white matter as described. White matter infarct and small lobar micro hemorrhages were seen on a 2019 brain MRI.   Ct Code Stroke Cta Head W/wo Contrast 04/05/2019 IMPRESSION:  1. No evidence of aneurysm, vascular malformation, or vasculitis.  2. There are areas of CTA spot sign which increase changes of hematoma growth.   Mr Brain Wo Contrast 04/15/2019 IMPRESSION:  1. Postoperative changes from interval left temporal craniotomy for hematoma evacuation. Previously seen large left temporal lobe intraparenchymal hemorrhage has largely been evacuated, with residual blood products seen within the resection cavity. Persistent localized edema and regional mass effect with trace 1-2 mm of left-to-right shift.  2. Associated intraventricular extension with small volume hemorrhage throughout the ventricular system. No hydrocephalus or ventricular trapping.  3. Small postoperative extra-axial collection and pneumocephalus overlying the left frontotemporal convexity without significant mass effect.   CT Head WO Contrast  04/15/2019 Unchanged left temporal hematoma and intraventricular clot when compared to prior MRI. Minimal subarachnoid hemorrhage is seen along the right sylvian fissure, likely redistribution. No midline shift and no ventricular obstruction.   Ct Head Wo Contrast 04/16/2019 Somewhat less air in the area of hemorrhage and in the left frontal regions compared to 1 day prior. Extent of hemorrhage is virtually identical with marginally less hemorrhage seen in the left lateral ventricle compared to 1 day prior. Stable mild  surrounding edema in the left temporal region. There is currently slight increased effacement of the left lateral ventricle compared to 1 day prior, best seen on coronal images. There is 2 mm of midline shift to the right. No new brain parenchymal lesion evident. Foci of paranasal sinus disease and mastoid disease noted.   Dg Chest Port 1 View 04/16/2019 1. Somewhat high positioning of the endotracheal tube, approximately 8 cm from the carina. Could be advanced 3 cm to the mid trachea. 2. Right IJ catheter in the mid SVC. 3. Satisfactory positioning of the transesophageal tube. 4. Unchanged dense retrocardiac opacity, likely a combination of pleural effusion and airspace disease. 5. Improving atelectasis and interstitial opacity elsewhere.  04/13/2019 1. New right IJ central venous catheter with the tip overlying the mid SVC.  2. No evidence of pneumothorax or other complication.  3. Overall improved aeration with decreased right basilar atelectasis and decreased interstitial edema versus vascular crowding.  4. Persistent dense left basilar airspace opacity.  04/19/2019 Endotracheal tube tip 3.7 cm above the carina. Low volume film with cardiomegaly, vascular congestion and interstitial pulmonary edema. Bibasilar collapse/consolidation, left greater than right.   Transthoracic Echocardiogram  04/02/2019  1. Left ventricular ejection fraction, by visual estimation, is 55 to 60%. The left ventricle has normal function. There is severely increased left ventricular hypertrophy.  2. Elevated left atrial pressure.  3. Left ventricular diastolic parameters are consistent with Grade II diastolic dysfunction (pseudonormalization).  4. The left ventricle has no regional wall motion abnormalities.  5. Global right ventricle has normal systolic function.The right ventricular size is normal. No increase in right ventricular wall thickness.  6. Left atrial size was mildly dilated.  7. Right atrial size was  normal.  8. Small pericardial effusion.  9. The pericardial effusion is circumferential. 10. The mitral valve is normal in structure. No evidence of mitral valve regurgitation. 11. The tricuspid valve is normal in structure. Tricuspid valve regurgitation is not demonstrated. 12. The aortic valve is normal in structure. Aortic valve regurgitation is trivial. 13. The pulmonic valve was normal in structure. Pulmonic valve regurgitation is not  visualized. 14. Aortic dilatation noted. 15. There is mild dilatation of the aortic root measuring 42 mm. 16. TR signal is inadequate for assessing pulmonary artery systolic pressure. 17. The inferior vena cava is dilated in size with <50% respiratory variability, suggesting right atrial pressure of 15 mmHg.  ECG - SR rate 61 BPM. (See cardiology reading for complete details)   PHYSICAL EXAM   Obese middle-aged Caucasian male who is sedated and intubated . [name] Afebrile. Head is nontraumatic. Neck is supple without bruit.    Cardiac exam no murmur or gallop. Lungs are clear to auscultation. Distal pulses are well felt. Neurological Exam :  Sedated and intubated.  Eyes are closed.  Barely responsive to sternal rub with partial opening of the eyes.  Semipurposeful flexion movements in the left upper and lower extremity and trace in the right lower extremity.  No response in the right upper extremity with occasional extensor posturing.  Deep tendon reflexes are depressed.  Plantars are both equivocal.    ASSESSMENT/PLAN [name] is a 53 y.o. male with history of small left temporal lobe stroke in 2019, old microhemorrhages bilaterally on prior brain MRI, morbid obesity, a-fib with RVR on Xarelto, CKD, systolicheart failure with prior EF of 20 to 25% and most recent echo with EF of 50-55% in June, hypertension, diabetes, hyperlipidemia, thoracic aortic aneurysm and OSA, presenting with acute onset of right hemiplegia and dysphasia. He did not receive IV  t-PA due to [place].  Stroke: large left temporal lobe acute hemorrhage  CT head - 49 cc hematoma in the left cerebral white matter as described. White matter infarct and small lobar micro hemorrhages were seen on a 2019 brain MRI.   Repeat CT Head 04/16/19 -improved air in the left frontal region but stable hemorrhage and mild surrounding edema with slight increase effacement of left lateral ventricle with 2 mm shift.    MRI head - Postoperative changes from interval left temporal craniotomy for hematoma evacuation. Previously seen large left temporal lobe intraparenchymal hemorrhage has largely been evacuated, with residual blood products seen within the resection cavity. Persistent localized edema and regional mass effect with trace 1-2 mm of left-to-right shift. Associated intraventricular extension with small volume hemorrhage throughout the ventricular system.  MRA head - not ordered  Code Stroke CTA Head - No evidence of aneurysm, vascular malformation, or vasculitis. There are areas of CTA spot sign which increase changes of hematoma growth.   CT Perfusion - not ordered  Carotid Doppler - not indicated  2D Echo - EF 60 - 65%. No cardiac source of emboli identified.   [employer] Virus 2 - negative  LDL - 75  HgbA1c - 5.8  UDS - not ordered  VTE prophylaxis - SCDs Diet  Diet Order            Diet NPO time specified  Diet effective now              Xarelto (rivaroxaban) daily prior to admission, now on No antithrombotic  Ongoing aggressive stroke risk factor management  Therapy recommendations:  pending  Disposition:  Pending  Hypertension  Home BP meds: Hydralazine ; Cozaar ; Metoprolol  Current BP meds: Cleviprex  Stable . SBP goal - 120 - 140 range for the first 24 hours. Then < 160 mmHg. [name] Long-term BP goal normotensive  Hyperlipidemia  Home Lipid lowering medication: Lipitor 80 mg daily  LDL 75, goal < 70  Current lipid lowering medication: none  Continue statin at discharge  Diabetes  Home diabetic meds: none   Current diabetic meds: Insulin  HgbA1c 5.8, goal < 7.0 Recent Labs    04/15/19 2345 04/16/19 0332 04/16/19 0831  GLUCAP 113* 127* 123*    Cerebral edema and regional mass effect   Mannitol - 25 g x 1 - 04/01/2019  Temporal Craniotomy - Dr Christella Noa - 04/04/2019  Seizure prophylaxis -> Keppra  Repeat Head CT - pending  Consider hypertonic therapy to keep Na 150-155, will await repeat CTH to assess shift   Other Stroke Risk Factors  Former cigarette smoker - quit  Previous ETOH use  Obesity, Body mass index is 50.52 kg/m., recommend weight loss, diet and exercise as appropriate   Hx stroke/TIA  Obstructive sleep apnea, not on CPAP at home  Congestive Heart Failure  Atrial fibrillation - Xarelto PTA   Other Active Problems  Difficult foley placement -> Dr Junious Silk - leave foley in place  Intubated - CCM on board  CKD - creatinine - 1.73  Pupillary changes - MRI reviewed by Dr Rory Percy  Paroxysmal Atrial fibrillation - Xarelto PTA -[facility] day # 2  I have personally obtained history,examined this patient, reviewed notes, independently viewed imaging studies, participated in medical decision making and plan of care.ROS completed by me personally and pertinent positives fully documented  I have made any additions or clarifications directly to the above note.  He presented with large left temporal parenchymal hemorrhage and underwent emergent craniotomy for evacuation but neurological exam remains poor with aphasia, right hemiplegia and agitation requiring sedation and ventilatory support for respiratory failure which continues.  Patient is unlikely to be able to be extubated soon given his agitation and aphasia and concern about protecting his airway.  I had a long discussion of the bedside with the patient's wife regarding his prognosis and answered questions.  She wants full  aggressive medical support and is willing to consider tracheostomy and PEG tube if necessary.  I recommend we check 24-hour overnight long-term EEG monitoring to evaluate for possible seizure activity.  Start tube feedings.  Wean Cardene drip and change systolic blood pressure goal to below 160 and use as needed IV labetalol or hydralazine.  Discussed with Dr. Kipp Brood critical care medicine. This patient is critically ill and at significant risk of neurological worsening, death and care requires constant monitoring of vital signs, hemodynamics,respiratory and cardiac monitoring, extensive review of multiple databases, frequent neurological assessment, discussion with family, other specialists and medical decision making of high complexity.I have made any additions or clarifications directly to the above note.This critical care time does not reflect procedure time, or teaching time or supervisory time of PA/NP/Med Resident etc but could involve care discussion time.  I spent 40 minutes of neurocritical care time  in the care of  this patient.     Antony Contras, MD Medical Director [facility] Pager: [phone] 04/16/2019 2:13 PM   To contact Stroke Continuity provider, please refer to [email or web address] After hours, contact General Neurology

## 2019-04-16 NOTE — Progress Notes (Signed)
  Amiodarone Drug - Drug Interaction Consult Note  Recommendations: None  Amiodarone is metabolized by the cytochrome P450 system and therefore has the potential to cause many drug interactions. Amiodarone has an average plasma half-life of 50 days (range 20 to 100 days).   There is potential for drug interactions to occur several weeks or months after stopping treatment and the onset of drug interactions may be slow after initiating amiodarone.   [x]  Statins: Increased risk of myopathy. Simvastatin- restrict dose to 20mg  daily.-- ok Pt was on Lipitor80 PTA Other statins: counsel patients to report any muscle pain or weakness immediately.  [x]  Anticoagulants: Amiodarone can increase anticoagulant effect. Consider warfarin dose reduction. Patients should be monitored closely and the dose of anticoagulant altered accordingly, remembering that amiodarone levels take several weeks to stabilize.  - Xarelto PtA, not warfarin  [x]  Antiepileptics: Amiodarone can increase plasma concentration of phenytoin, the dose should be reduced. Note that small changes in phenytoin dose can result in large changes in levels. Monitor patient and counsel on signs of toxicity.- IV Keppra ok  [x]  Beta blockers: increased risk of bradycardia, AV block and myocardial depression. Sotalol - avoid concomitant use.  []   Calcium channel blockers (diltiazem and verapamil): increased risk of bradycardia, AV block and myocardial depression.  []   Cyclosporine: Amiodarone increases levels of cyclosporine. Reduced dose of cyclosporine is recommended.  []  Digoxin dose should be halved when amiodarone is started.  [x]  Diuretics: increased risk of cardiotoxicity if hypokalemia occurs.  - Torsemide PTA  []  Oral hypoglycemic agents (glyburide, glipizide, glimepiride): increased risk of hypoglycemia. Patient's glucose levels should be monitored closely when initiating amiodarone therapy.   []  Drugs that prolong the QT interval:   Torsades de pointes risk may be increased with concurrent use - avoid if possible.  Monitor QTc, also keep magnesium/potassium WNL if concurrent therapy can't be avoided. [name] Antibiotics: e.g. fluoroquinolones, erythromycin. . Antiarrhythmics: e.g. quinidine, procainamide, disopyramide, sotalol. . Antipsychotics: e.g. phenothiazines, haloperidol.  . Lithium, tricyclic antidepressants, and methadone. Thank You,  Wayland Salinas  04/16/2019 12:11 PM

## 2019-04-16 NOTE — Progress Notes (Signed)
eLink Physician-Brief Progress Note Patient Name: Jay Chen DOB: 09/18/1965 MRN: [id number]   Date of Service  04/16/2019  HPI/Events of Note  CXR reviewed. Tip of ETT 8 cm above the carina.  eICU Interventions   Discussed with RT to move tube in by 3 cm  Noted to be tachycardic 110, bedside RN noted that he may be developing a fever although still currently afebrile. Ordered blood cultures if temp > 100.4     Intervention Category Intermediate Interventions: Diagnostic test evaluation  Judd Lien 04/16/2019, 1:47 AM

## 2019-04-16 NOTE — Progress Notes (Signed)
NAME:  Jay Chen, MRN:  [id number], DOB:  1965/07/16, LOS: 2 ADMISSION DATE:  04/18/2019, CONSULTATION DATE:  03/25/2019  REFERRING MD:  Billy Fischer, CHIEF COMPLAINT:  ICH, respiratory failure  Brief History   53 yo male with PMH as below presented to the ED on 11/21 with right hemiparesis and aphasia.  Emergently to CT --> ICH --> to OR for craniotomy and hematoma evacuation.  PCCM consulted for vent management.  Past Medical History  Atrial fibrillation on xarelto, Obesity,OSA,HTN,HLD DM,CHF,CKD,Gout,Gallstones.Thoracic aortic aneurysm  Significant Hospital Events   11/21> ICH. Intubated. To OR emergently.   Consults:  PCCM NSGY  Procedures:  ETT 11/21>>> CVC 11/21>> Crani 11/21>>  Significant Diagnostic Tests:  11/21 CT Head> L cerebral ICH 5.7 x 4.1 x 4.2 cm. Local mass effect. Mild edema.  11/21 Echo> EF 55-60%, increased LVH, Grade II diastolic dysfunction, no valvular abnormalities 11/22 MRI Head> ICH largely evacuated, some residual blood, persistent edema and mass effect, 1-2 mm shift 11/22 CT Head> Hematoma unchanged from MRI, minimal subarachnoid hemorrhage 11/23 CT Head> Extent of hemorrhage unchanged, stable edema, 2 mm L to R shift  Micro Data:  04/09/2019 SARS CoV2> neg  Antimicrobials:    Interim history/subjective:  Intubated, sedated on propofol. Unable to obtain ROS.  Objective   Blood pressure 118/78, pulse (Abnormal) 139, temperature 99.3 F (37.4 C), temperature source Axillary, resp. rate 19, height 5\' 11"  (1.803 m), SpO2 91 %.    Vent Mode: PRVC FiO2 (%):  [80 %-100 %] 100 % Set Rate:  [16 bmp] 16 bmp Vt Set:  [580 mL-640 mL] 640 mL PEEP:  [10 cmH20-14 cmH20] 14 cmH20 Plateau Pressure:  [23 cmH20-29 cmH20] 27 cmH20   Intake/Output Summary (Last 24 hours) at 04/16/2019 0817 Last data filed at 04/16/2019 0700 Gross per 24 hour  Intake 1223.52 ml  Output 1030 ml  Net 193.52 ml   There were no vitals filed for this visit.   Examination: Gen: Morbidly obese male, intubated. HENT: L temporal incision CDI, ETT in place, poor dentition, pupils 2 mm & sluggish Pulm: Clear, diminished bases. PRVC 100% fiO2/14 PEEP. Card: A-fib 110's, no MGR.  GI: Obese, soft, hypoactive BS GU: Dark yellow urine via foley Neuro: Lightly sedated on low dose propofol, decorticate posturing to deep noxious stimuli Ext: Warm, dry, distal pulses 2+  Resolved Hospital Problem list     Assessment & Plan:   Acute respiratory failure requiring intubation in setting of ICH Hx OSA pcxr ett good position/ right IJ CVL good. Left basilar vol loss likely representing atx. Mod amt interstitial changes.  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  Pneumocephalus improving.  ICH score 2, FUNC score 8. Plan Hold AC NIHSS q shift Keppra & propofol d/t seizure activity. Wean propofol off to assess neuro fxn. EEG tonight  Monitor lytes, Na 140 today.  HTN On clevidipine infusion, weaned off. Plan PRN IV labetalol and IV hydralazine to keep SBP <160 Restart home antihypertensives tomorrow.  DM A1C 5.8 Plan SSI  H/o CHF -ECHO 11/21 EF 55-60%, increased LVH, Grade II diastolic dysfunction, no valvular abnormalities Plan Holding home torsemide.   Monitor volume status.  HLD Plan Restart home lipitor  Acute on chronic kidney injury. Cr 2.6, baseline 1.7.   Plan Renal dose meds. Keep euvolemic  Am chemistry Strict I&O  H/o AFib Plan Load with IV amio bolus, restart home PO amiodarone Holding AC (home xarelto)  Best practice:  Diet: Place dobhoff and start TF. Pain/Anxiety/Delirium protocol (if indicated): prop VAP protocol (if indicated):  yes DVT prophylaxis: SCD GI prophylaxis: protonix Glucose control: SSI Mobility: BR Code Status: Full  Family Communication: per primary Disposition: Will be admitted by stroke service to ICU with PCCM consulting for vent management    Critical care time: 32 minutes      04/16/19 8:17 AM

## 2019-04-16 NOTE — Progress Notes (Signed)
PT Cancellation Note  Patient Details Name: Jay Chen MRN: [id number] DOB: 1966-01-08   Cancelled Treatment:    Reason Eval/Treat Not Completed: Active bedrest order. Pt remains intubated, tachycardic and on bed rest. PT to return as able, as appropriate to complete PT eval.  Kittie Plater, PT, DPT Acute Rehabilitation Services Pager #: [phone] Office #: [phone]   Berline Lopes 04/16/2019, 8:19 AM

## 2019-04-16 NOTE — Progress Notes (Signed)
SLP Cancellation Note  Patient Details Name: Jay Chen MRN: [id number] DOB: 1966-05-24   Cancelled treatment:        Continues to be intubated. Will follow   Jay Chen 04/16/2019, 8:20 AM  Orbie Pyo Colvin Caroli.Ed Risk analyst [phone] Office [phone]

## 2019-04-16 NOTE — Progress Notes (Signed)
eLink Physician-Brief Progress Note Patient Name: Jay Chen DOB: 1966/01/26 MRN: [id number]   Date of Service  04/16/2019  HPI/Events of Note  RT pointed out that CXR from 7.45 am noted a high placed ETT. Asked if this should be advanced. Patient with o2 sat 98 on 100%/peep 14 -unchanged from prior  eICU Interventions  Repeat CXR now and assess Please let us know when X ray results      Intervention Category Major Interventions: Respiratory failure - evaluation and management  Margaretmary Lombard 04/16/2019, 12:45 AM

## 2019-04-16 NOTE — Progress Notes (Signed)
Patient ID: Jay Chen, male   DOB: March 27, 1966, 53 y.o.   MRN: [id number] BP 109/73   Pulse 80   Temp (!) 101.3 F (38.5 C) (Axillary)   Resp 17   Ht 5\' 11"  (1.803 m)   Wt (!) 172.8 kg   SpO2 90%   BMI 53.13 kg/m  Intubated, sedated Extension to noxious stimuli No real neurological change Head CT no change from MRI, less edema/mass effect. Wound is clean ,dry, no signs of infection Mgmt per neurology

## 2019-04-16 NOTE — Addendum Note (Signed)
Addendum  created 04/16/19 1545 by Josephine Igo, CRNA   Order list changed

## 2019-04-16 NOTE — Progress Notes (Signed)
Critical care attending attestation note:  Patient seen and examined and relevant ancillary tests reviewed.   Synopsis of assessment and plan:  53 year old man who presented with right sided weakness. Left ICH with mass effect. Intubated for airway protection. Precipitous decline in mental status. Open evacuation of hematoma.  Remains critically ill due to acute hypoxic respiratory failure requiring mechanical ventilation.   On my examination: morbidly obese. No distress. Off sedation, left gaze preference and will not cross midline. Extension to pain in all 4 extremities. Chest is clear. Patient in AF with RVR.  Assessment:  Acute hypoxic respiratory failure Permanent atrial fibrillation with rapid ventricular response Uncontrolled essential hypertension requiring clevidipine titration ICH - with low calculated mortality and reasonable odds of good functional outcome. CHF DM Likely undiagnosed OSA.   Currently at peak edema window. EEG to investigate ongoing encephalopathy. Requiring fair amount of PEEP despite relatively normal CXR. May reflect chronic obesity related lung disease with low chest wall compliance.  Wean ventilator as tolerated but given likely aphasia and high risk body habitus, would be at high risk for extubation failure with subsequent potentiation of secondary neurological injury. Would recommend proceeding directly to tracheostomy.   Daily Goals Checklist  Pain/Anxiety/Delirium protocol (if indicated): propofol for agitation for now until ventilation improves.  Fentanyl infusion. VAP protocol (if indicated): bundle in place. Respiratory support goals: keep PEEP at 14 and try to wean FiO2 Blood pressure target: 110-140, IV amiodarone load for HR DVT prophylaxis: SCD only given bleed. Consider UFH at 7 days. Nutritional status and feeding goals: tolerating tube feeds and insert Cortrak for long-term feeding GI prophylaxis: Pantoprazole. Fluid status goals:  allow autoregulation. Urinary catheter: Assessment of intravascular volume Central lines: Double lumen CVC right subclavian Glucose control: euglycemic on SSI only Mobility/therapy needs: Unable to participate Antibiotic de-escalation: no antibiotics. Home medication reconciliation: Amiodarone and metoprolol restarted. Daily labs: CBC and BMP to follow rising creatinine and leukocytosis. Code Status: Full  Family Communication: Will be updated by Dr Leonie Man Disposition: ICU   CRITICAL CARE Performed by: Kipp Brood   Total critical care time: 50 minutes  Critical care time was exclusive of separately billable procedures and treating other patients.  Critical care was necessary to treat or prevent imminent or life-threatening deterioration.  Critical care was time spent personally by me on the following activities: development of treatment plan with patient and/or surrogate as well as nursing, discussions with consultants, evaluation of patient's response to treatment, examination of patient, obtaining history from patient or surrogate, ordering and performing treatments and interventions, ordering and review of laboratory studies, ordering and review of radiographic studies, pulse oximetry, re-evaluation of patient's condition and participation in multidisciplinary rounds.  Kipp Brood, MD [facility] ICU Physician [place]  Pager: [phone] Mobile: [phone] After hours: [phone].  04/16/2019, 11:25 AM

## 2019-04-16 NOTE — Progress Notes (Signed)
LTM EEG hooked up and running - no initial skin breakdown - push button tested - neuro notified.  

## 2019-04-16 NOTE — Progress Notes (Signed)
RT NOTE: RN called RT to bedside for desaturation. RT arrived and patient was sating 87% on 100% FiO2 and 14 of PEEP. Breath sounds clear and diminished. RN suctioned patient prior to RT arrival and said patient has minimal secretions. RT bagged patient to rule out mucus plugging. Patient was very easy to bag and after a lavage only minimal secretions. RT placed patient back on vent and patient sats currently 91-92%. MD is now at bedside and aware. RT waiting for new orders.

## 2019-04-16 NOTE — Procedures (Signed)
Patient Name: Jay Chen  MRN: [id number]  Epilepsy Attending: Lora Havens  Referring Physician/Provider: Dr. Antony Contras Date: 04/16/2019 Duration: 26.11 minutes  Patient history: 53 year old male with left temporal ICH status post craniotomy.  EEG to evaluate for seizures.  Level of alertness: Comatose/sedated  AEDs during EEG study: Keppra, propofol  Technical aspects: This EEG study was done with scalp electrodes positioned according to the 10-20 International system of electrode placement. Electrical activity was acquired at a sampling rate of 500Hz  and reviewed with a high frequency filter of 70Hz  and a low frequency filter of 1Hz . EEG data were recorded continuously and digitally stored.   Description: EEG showed continuous generalized, maximal left frontotemporal, 2 to 3 Hz delta slowing admixed with 15 to 18 Hz, 2-3 uV beta activity with irregular morphology distributed symmetrically and diffusely.  Intermittent rhythmic 2 to 3 Hz generalized delta slowing was also noted.  EEG was reactive to tactile stimulation.  Hyperventilation and photic stimulation were not performed.  Abnormality -Continuous low, generalized, maximal left frontotemporal -Intermittent rhythmic slow, generalized  IMPRESSION: This study is suggestive of cortical dysfunction in left frontotemporal region likely secondary to underlying ICH.  Additionally there is evidence of severe diffuse encephalopathy, nonspecific etiology but could be secondary to sedated state.  No seizures or epileptiform discharges were seen throughout the recording.

## 2019-04-16 NOTE — Progress Notes (Signed)
RT NOTE: RN called RT due to Agarwala, MD wanting to decrease PEEP and eventually FiO2 today. MD turned PEEP down to 10. Once  RT arrived bedside, RN was in room and patient was sating in the mid to high 80's. RT asked Agarwala, MD if he has a sat goal for patient and told him about the desaturation. Per MD RT increase PEEP to 12 and attempt to wean FiO2 today. RT increased PEEP and sats did not change. RT spoke with MD again and he said to increase the PEEP back to 14 and just try to wean FiO2 today. RT increased PEEP and will continue to monitor.

## 2019-04-16 NOTE — Progress Notes (Signed)
RT NOTE: RT transported patient from 4N25 to CT and back with RN. No complications. VS stable. RT will continue to monitor.

## 2019-04-16 NOTE — Progress Notes (Signed)
Initial Nutrition Assessment  DOCUMENTATION CODES:   Morbid obesity  INTERVENTION:   Initiate Vital High Protein @ 25 ml/hr via OG tube 60 ml Prostat five times per day Provides: 1600 kcal, 202 grams protein, and 501 ml free water.   TF regimen and propofol at current rate providing 2294 total kcal/day   NUTRITION DIAGNOSIS:   Inadequate oral intake related to inability to eat as evidenced by NPO status.  GOAL:   Provide needs based on ASPEN/SCCM guidelines  MONITOR:   Vent status, TF tolerance  REASON FOR ASSESSMENT:   Consult, Ventilator Enteral/tube feeding initiation and management  ASSESSMENT:   Pt with PMH of obesity, OSA, HTN, HLD, DM, CHF, CKD, thoracic aortic aneurysm admitted 11/21 with R hemiparesis and aphasia. Dx with large L ICH s/p crani.   Pt discussed during ICU rounds and with RN.  Per RN pt with PEEP @ 14 and 100% O2 and pt continues to have episodes of desatting. Noted fevers.   Patient is currently intubated on ventilator support MV: 11.3 L/min Temp (24hrs), Avg:100.2 F (37.9 C), Min:99 F (37.2 C), Max:101.9 F (38.8 C)  Propofol: 26.3 ml/hr provides: 694 kcal - reviewed in pt room    Medications and Labs reviewed Pt was weaned off Cleviprex earlier today, now back on at rate of 2 ml/hr (provides: 96 kcal)  NUTRITION - FOCUSED PHYSICAL EXAM:    Most Recent Value  Orbital Region  No depletion  Upper Arm Region  No depletion  Thoracic and Lumbar Region  No depletion  Buccal Region  No depletion  Temple Region  No depletion  Clavicle Bone Region  No depletion  Clavicle and Acromion Bone Region  No depletion  Scapular Bone Region  No depletion  Dorsal Hand  No depletion  Patellar Region  No depletion  Anterior Thigh Region  No depletion  Posterior Calf Region  No depletion  Edema (RD Assessment)  Mild  Hair  Reviewed  Eyes  Unable to assess  Mouth  Unable to assess  Skin  Reviewed  Nails  Reviewed       Diet Order:   Diet  Order            Diet NPO time specified  Diet effective now              EDUCATION NEEDS:   Not appropriate for education at this time  Skin:  Skin Assessment: Reviewed RN Assessment  Last BM:  11/23 smear  Height:   Ht Readings from Last 1 Encounters:  04/16/19 5\' 11"  (1.803 m)    Weight:   Wt Readings from Last 1 Encounters:  04/16/19 (!) 172.8 kg    Ideal Body Weight:  78.1 kg  BMI:  Body mass index is 53.13 kg/m.  Estimated Nutritional Needs:   Kcal:  1400-1600  Protein:  195 grams  Fluid:  >1.5 L/day  Maylon Peppers RD, LDN, CNSC [phone] Pager [phone] After Hours Pager

## 2019-04-16 NOTE — Progress Notes (Signed)
EEG complete - results pending 

## 2019-04-16 NOTE — Progress Notes (Signed)
OT Cancellation Note  Patient Details Name: Jay Chen MRN: [id number] DOB: 1965-06-10   Cancelled Treatment:    Reason Eval/Treat Not Completed: Active bedrest order;Patient not medically ready.  Pt intubated and on bedrest.  Will reatttempt.  Lucille Passy, OTR/L [place] Pager [phone] Office [phone]   Lucille Passy M 04/16/2019, 9:52 AM

## 2019-04-17 ENCOUNTER — Inpatient Hospital Stay (HOSPITAL_COMMUNITY): Payer: BC Managed Care – PPO

## 2019-04-17 LAB — CBC WITH DIFFERENTIAL/PLATELET
Abs Immature Granulocytes: 0.1 10*3/uL — ABNORMAL HIGH (ref 0.00–0.07)
Basophils Absolute: 0 10*3/uL (ref 0.0–0.1)
Basophils Relative: 0 %
Eosinophils Absolute: 0.1 10*3/uL (ref 0.0–0.5)
Eosinophils Relative: 0 %
HCT: 35 % — ABNORMAL LOW (ref 39.0–52.0)
Hemoglobin: 11.1 g/dL — ABNORMAL LOW (ref 13.0–17.0)
Immature Granulocytes: 1 %
Lymphocytes Relative: 7 %
Lymphs Abs: 1 10*3/uL (ref 0.7–4.0)
MCH: 30.3 pg (ref 26.0–34.0)
MCHC: 31.7 g/dL (ref 30.0–36.0)
MCV: 95.6 fL (ref 80.0–100.0)
Monocytes Absolute: 1.6 10*3/uL — ABNORMAL HIGH (ref 0.1–1.0)
Monocytes Relative: 10 %
Neutro Abs: 13.1 10*3/uL — ABNORMAL HIGH (ref 1.7–7.7)
Neutrophils Relative %: 82 %
Platelets: 168 10*3/uL (ref 150–400)
RBC: 3.66 MIL/uL — ABNORMAL LOW (ref 4.22–5.81)
RDW: 14.4 % (ref 11.5–15.5)
WBC: 15.9 10*3/uL — ABNORMAL HIGH (ref 4.0–10.5)
nRBC: 0 % (ref 0.0–0.2)

## 2019-04-17 LAB — TYPE AND SCREEN
ABO/RH(D): A POS
Antibody Screen: NEGATIVE
Unit division: 0
Unit division: 0
Unit division: 0
Unit division: 0
Unit division: 0
Unit division: 0

## 2019-04-17 LAB — POCT I-STAT 7, (LYTES, BLD GAS, ICA,H+H)
Acid-base deficit: 1 mmol/L (ref 0.0–2.0)
Acid-base deficit: 1 mmol/L (ref 0.0–2.0)
Bicarbonate: 25 mmol/L (ref 20.0–28.0)
Bicarbonate: 26.6 mmol/L (ref 20.0–28.0)
Calcium, Ion: 1.11 mmol/L — ABNORMAL LOW (ref 1.15–1.40)
Calcium, Ion: 1.14 mmol/L — ABNORMAL LOW (ref 1.15–1.40)
HCT: 33 % — ABNORMAL LOW (ref 39.0–52.0)
HCT: 34 % — ABNORMAL LOW (ref 39.0–52.0)
Hemoglobin: 11.2 g/dL — ABNORMAL LOW (ref 13.0–17.0)
Hemoglobin: 11.6 g/dL — ABNORMAL LOW (ref 13.0–17.0)
O2 Saturation: 86 %
O2 Saturation: 98 %
Patient temperature: 100.4
Patient temperature: 100.4
Potassium: 3.4 mmol/L — ABNORMAL LOW (ref 3.5–5.1)
Potassium: 3.4 mmol/L — ABNORMAL LOW (ref 3.5–5.1)
Sodium: 140 mmol/L (ref 135–145)
Sodium: 140 mmol/L (ref 135–145)
TCO2: 26 mmol/L (ref 22–32)
TCO2: 28 mmol/L (ref 22–32)
pCO2 arterial: 47.5 mmHg (ref 32.0–48.0)
pCO2 arterial: 57.3 mmHg — ABNORMAL HIGH (ref 32.0–48.0)
pH, Arterial: 7.335 — ABNORMAL LOW (ref 7.350–7.450)
pH, Arterial: 7.28 — ABNORMAL LOW (ref 7.350–7.450)
pO2, Arterial: 59 mmHg — ABNORMAL LOW (ref 83.0–108.0)
pO2, Arterial: 134 mmHg — ABNORMAL HIGH (ref 83.0–108.0)

## 2019-04-17 LAB — BPAM RBC
Blood Product Expiration Date: 202012132359
Blood Product Expiration Date: 202012162359
Blood Product Expiration Date: 202012162359
Blood Product Expiration Date: 202012172359
Blood Product Expiration Date: 202012172359
Blood Product Expiration Date: 202012172359
ISSUE DATE / TIME: 202011211132
ISSUE DATE / TIME: 202011211132
ISSUE DATE / TIME: 202011211132
ISSUE DATE / TIME: 202011220432
Unit Type and Rh: 6200
Unit Type and Rh: 6200
Unit Type and Rh: 6200
Unit Type and Rh: 6200
Unit Type and Rh: 6200
Unit Type and Rh: 6200

## 2019-04-17 LAB — GLUCOSE, CAPILLARY
Glucose-Capillary: 148 mg/dL — ABNORMAL HIGH (ref 70–99)
Glucose-Capillary: 143 mg/dL — ABNORMAL HIGH (ref 70–99)
Glucose-Capillary: 122 mg/dL — ABNORMAL HIGH (ref 70–99)
Glucose-Capillary: 104 mg/dL — ABNORMAL HIGH (ref 70–99)
Glucose-Capillary: 127 mg/dL — ABNORMAL HIGH (ref 70–99)
Glucose-Capillary: 130 mg/dL — ABNORMAL HIGH (ref 70–99)

## 2019-04-17 LAB — PHOSPHORUS
Phosphorus: 6.4 mg/dL — ABNORMAL HIGH (ref 2.5–4.6)
Phosphorus: 5.9 mg/dL — ABNORMAL HIGH (ref 2.5–4.6)

## 2019-04-17 LAB — BASIC METABOLIC PANEL
Anion gap: 10 (ref 5–15)
BUN: 60 mg/dL — ABNORMAL HIGH (ref 6–20)
CO2: 25 mmol/L (ref 22–32)
Calcium: 8 mg/dL — ABNORMAL LOW (ref 8.9–10.3)
Chloride: 104 mmol/L (ref 98–111)
Creatinine, Ser: 3.65 mg/dL — ABNORMAL HIGH (ref 0.61–1.24)
GFR calc Af Amer: 20 mL/min — ABNORMAL LOW (ref >60)
GFR calc non Af Amer: 18 mL/min — ABNORMAL LOW (ref >60)
Glucose, Bld: 129 mg/dL — ABNORMAL HIGH (ref 70–99)
Potassium: 3.3 mmol/L — ABNORMAL LOW (ref 3.5–5.1)
Sodium: 139 mmol/L (ref 135–145)

## 2019-04-17 LAB — MAGNESIUM
Magnesium: 2 mg/dL (ref 1.7–2.4)
Magnesium: 2 mg/dL (ref 1.7–2.4)

## 2019-04-17 LAB — TRIGLYCERIDES: Triglycerides: 127 mg/dL (ref <150)

## 2019-04-17 MED ORDER — FUROSEMIDE 10 MG/ML IJ SOLN
120.0000 mg | Freq: Once | INTRAVENOUS | Status: AC
  Administered 2019-04-17: 120 mg via INTRAVENOUS
  Filled 2019-04-17: qty 10

## 2019-04-17 NOTE — Plan of Care (Addendum)
Critical Care Plan of Care:  Patient persistently hypoxic, requiring 100% FiO2 and high PEEP (20). Driving pressure is not particularly high which is perplexing. CXR shows bibasilar opacities and cardiomegaly, no pneumothorax, and ETT is well positioned. Small to moderate effusion unlikely to be culprit of hypoxia. PE possible, but echo not suggestive of severe RV dysfunction and he is not candidate for anticoagulation regardless given intracranial hemorrhage.  Patient meets criteria for proning based criteria from [facility].   He responded to increase in PEEP to 20. Diurese with Lasix 120 mg. Repeat ABG 1 hour following proning. If he remains hypoxic, would proceed with proning.  Jay Chen 04/17/19 3:22 AM

## 2019-04-17 NOTE — Progress Notes (Signed)
SLP Cancellation Note  Patient Details Name: Jay Chen MRN: [id number] DOB: 19-May-1966   Cancelled treatment:  Pt not medically ready.  SLP will sign off. Please reorder when warranted.  Jay Chen. Tivis Ringer, [place] CCC/SLP Acute Rehabilitation Services Office number [phone] Pager [phone]          Jay Chen 04/17/2019, 7:46 AM

## 2019-04-17 NOTE — Progress Notes (Signed)
STROKE TEAM PROGRESS NOTE   INTERVAL HISTORY Patient remains on ventilatory support for respiratory failure and remains difficult to ventilate due to secretions and history of CHF and COPD.  Remains sedated and blood pressure is adequately controlled.  Is localizing on the left side and moving the right leg a little bit but not much movement in the right arm. Overnight 24-hour EEG monitoring shows no definite seizure activity but focal left hemispheric slowing and mild generalized slowing. OBJECTIVE Vitals:   04/17/19 0600 04/17/19 0700 04/17/19 0800 04/17/19 0813  BP: (!) 147/89 (!) 133/109 112/71   Pulse: (!) 33  (!) 114   Resp: 14 14 14    Temp:   (!) 102.3 F (39.1 C)   TempSrc:   Axillary   SpO2: 99%  100% 100%  Weight:      Height:        CBC:  Recent Labs  Lab 04/15/19 0803  04/17/19 0309 04/17/19 0314 04/17/19 0459  WBC 19.6*  --  15.9*  --   --   NEUTROABS 16.9*  --  13.1*  --   --   HGB 13.5   < > 11.1* 11.2* 11.6*  HCT 41.0   < > 35.0* 33.0* 34.0*  MCV 94.0  --  95.6  --   --   PLT 190  --  168  --   --    < > = values in this interval not displayed.   Basic Metabolic Panel:  Recent Labs  Lab 04/16/19 0238  04/16/19 1613 04/17/19 0309 04/17/19 0314 04/17/19 0459  NA 140   < >  --  139 140 140  K 3.8   < >  --  3.3* 3.4* 3.4*  CL 104  --   --  104  --   --   CO2 26  --   --  25  --   --   GLUCOSE 142*  --   --  129*  --   --   BUN 35*  --   --  60*  --   --   CREATININE 2.60*  --   --  3.65*  --   --   CALCIUM 8.1*  --   --  8.0*  --   --   MG 2.0  --  2.0 2.0  --   --   PHOS  --   --  5.4* 6.4*  --   --    < > = values in this interval not displayed.   Lipid Panel:     Component Value Date/Time   CHOL 116 01/01/2018 0159   TRIG 127 04/17/2019 0309   HDL 24 (L) 01/01/2018 0159   CHOLHDL 4.8 01/01/2018 0159   VLDL 17 01/01/2018 0159   LDLCALC 75 01/01/2018 0159   HgbA1c:  Lab Results  Component Value Date   HGBA1C 5.8 (H) 04/09/2019   Urine  Drug Screen: No results found for: LABOPIA, COCAINSCRNUR, LABBENZ, AMPHETMU, THCU, LABBARB  Alcohol Level No results found for: [place] Head Code Stroke Wo Contrast 04/21/2019 IMPRESSION:  49 cc hematoma in the left cerebral white matter as described. White matter infarct and small lobar micro hemorrhages were seen on a 2019 brain MRI.   Ct Code Stroke Cta Head W/wo Contrast 04/16/2019 IMPRESSION:  1. No evidence of aneurysm, vascular malformation, or vasculitis.  2. There are areas of CTA spot sign which increase changes of hematoma growth.   Mr Brain Lottie Dawson  Contrast 04/15/2019 IMPRESSION:  1. Postoperative changes from interval left temporal craniotomy for hematoma evacuation. Previously seen large left temporal lobe intraparenchymal hemorrhage has largely been evacuated, with residual blood products seen within the resection cavity. Persistent localized edema and regional mass effect with trace 1-2 mm of left-to-right shift.  2. Associated intraventricular extension with small volume hemorrhage throughout the ventricular system. No hydrocephalus or ventricular trapping.  3. Small postoperative extra-axial collection and pneumocephalus overlying the left frontotemporal convexity without significant mass effect.   CT Head WO Contrast  04/15/2019 Unchanged left temporal hematoma and intraventricular clot when compared to prior MRI. Minimal subarachnoid hemorrhage is seen along the right sylvian fissure, likely redistribution. No midline shift and no ventricular obstruction.   Ct Head Wo Contrast 04/16/2019 Somewhat less air in the area of hemorrhage and in the left frontal regions compared to 1 day prior. Extent of hemorrhage is virtually identical with marginally less hemorrhage seen in the left lateral ventricle compared to 1 day prior. Stable mild surrounding edema in the left temporal region. There is currently slight increased effacement of the left lateral ventricle compared  to 1 day prior, best seen on coronal images. There is 2 mm of midline shift to the right. No new brain parenchymal lesion evident. Foci of paranasal sinus disease and mastoid disease noted.   Dg Chest Port 1 View 04/17/2019 1. Increased right basilar opacities. Unchanged small left pleural effusion. 2. Slight retraction of endotracheal tube. Otherwise unchanged support apparatus. 04/16/2019 1. Somewhat high positioning of the endotracheal tube, approximately 8 cm from the carina. Could be advanced 3 cm to the mid trachea. 2. Right IJ catheter in the mid SVC. 3. Satisfactory positioning of the transesophageal tube. 4. Unchanged dense retrocardiac opacity, likely a combination of pleural effusion and airspace disease. 5. Improving atelectasis and interstitial opacity elsewhere.  04/13/2019 1. New right IJ central venous catheter with the tip overlying the mid SVC.  2. No evidence of pneumothorax or other complication.  3. Overall improved aeration with decreased right basilar atelectasis and decreased interstitial edema versus vascular crowding.  4. Persistent dense left basilar airspace opacity.  04/17/2019 Endotracheal tube tip 3.7 cm above the carina. Low volume film with cardiomegaly, vascular congestion and interstitial pulmonary edema. Bibasilar collapse/consolidation, left greater than right.   Transthoracic Echocardiogram  04/20/2019  1. Left ventricular ejection fraction, by visual estimation, is 55 to 60%. The left ventricle has normal function. There is severely increased left ventricular hypertrophy.  2. Elevated left atrial pressure.  3. Left ventricular diastolic parameters are consistent with Grade II diastolic dysfunction (pseudonormalization).  4. The left ventricle has no regional wall motion abnormalities.  5. Global right ventricle has normal systolic function.The right ventricular size is normal. No increase in right ventricular wall thickness.  6. Left atrial size was mildly  dilated.  7. Right atrial size was normal.  8. Small pericardial effusion.  9. The pericardial effusion is circumferential. 10. The mitral valve is normal in structure. No evidence of mitral valve regurgitation. 11. The tricuspid valve is normal in structure. Tricuspid valve regurgitation is not demonstrated. 12. The aortic valve is normal in structure. Aortic valve regurgitation is trivial. 13. The pulmonic valve was normal in structure. Pulmonic valve regurgitation is not visualized. 14. Aortic dilatation noted. 15. There is mild dilatation of the aortic root measuring 42 mm. 16. TR signal is inadequate for assessing pulmonary artery systolic pressure. 17. The inferior vena cava is dilated in size with <50% respiratory variability, suggesting  right atrial pressure of 15 mmHg.  Transthoracic Echocardiogram  04/17/2019 By CCM. LVH with normal LV size and function. RV mildly dilated with mildly reduced function. No septal dyssynchrony. No significant valvular lesions. Indeterminate diastolic function due AF, filling pressures appear normal. Unable to calculate RVSP but PAP likely normal by PVAT. PA seen to bifurcation with no dilatation. IVC not visualized. TDS.  ECG - SR rate 61 BPM. (See cardiology reading for complete details)  EEG 04/16/2019 This study is suggestive of cortical dysfunction in left frontotemporal region likely secondary to underlying ICH.  Additionally there is evidence of severe diffuse encephalopathy, nonspecific etiology but could be secondary to sedated state.  No seizures or epileptiform discharges were seen throughout the recording.  LT EEG 04/17/2019 This study issuggestive of cortical dysfunction in left frontotemporal region likely secondary to underlying ICH.Additionally there is evidence of severe diffuse encephalopathy, nonspecific etiology but could be secondary to sedated state. No seizures or epileptiform discharges were seen throughout the  recording.   PHYSICAL EXAM     Obese middle-aged Caucasian male who is sedated and intubated . [name] Afebrile. Head is nontraumatic. Neck is supple without bruit.    Cardiac exam no murmur or gallop. Lungs are clear to auscultation. Distal pulses are well felt. Neurological Exam :  Sedated and intubated.  Eyes are closed.  Barely responsive to sternal rub with partial opening of the eyes.  Semipurposeful flexion movements in the left upper and lower extremity and trace in the right lower extremity.  No response in the right upper extremity with occasional extensor posturing.  Deep tendon reflexes are depressed.  Plantars are both equivocal.   ASSESSMENT/PLAN Jay Chen is a 53 y.o. male with history of small left temporal lobe stroke in 2019, old microhemorrhages bilaterally on prior brain MRI, morbid obesity, a-fib with RVR on Xarelto, CKD, systolic heart failure with prior EF of 20 to 25% and most recent echo with EF of 50-55% in June, hypertension, diabetes, hyperlipidemia, thoracic aortic aneurysm and OSA, presenting with acute onset of right hemiplegia and dysphasia. He did not receive IV t-PA due to [place].  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 White matter infarct and small lobar micro hemorrhages were seen on a 2019 brain MRI.   Code Stroke CTA Head - No evidence of aneurysm, vascular malformation, or vasculitis. There are areas of CTA spot sign which increase changes of hematoma growth.   MRI head - Postoperative changes from interval left temporal craniotomy for hematoma evacuation. Previously seen large left temporal lobe intraparenchymal hemorrhage has largely been evacuated, with residual blood products seen within the resection cavity. Persistent localized edema and regional mass effect with trace 1-2  mm of left-to-right shift. Associated intraventricular extension with small volume hemorrhage throughout the ventricular system.  Repeat CT Head 04/16/19 -improved air in the left frontal region but stable hemorrhage and mild surrounding edema with slight increase effacement of left lateral ventricle with 2 mm shift.    2D Echo - EF 60 - 65%. No cardiac source of emboli identified.   Echocardiogram performed by CCM at bedside 11/24 - LVH normal. RV mildly dilated w/ reduced fxn.  EEG, LT EEG no sz, L temporal lobe cortical dysfunction  [employer] Virus 2 - negative  LDL - 75  HgbA1c - 5.8  UDS - not ordered  VTE prophylaxis - SCDs  Xarelto (rivaroxaban) daily prior to admission, now on No antithrombotic given hemorrhage   Therapy recommendations:  pending  Disposition:  Pending  Acute Hypoxic Respiratory Failure w/ refractory hypoxia Volume Overload / Atelectasis / Aspiration in setting of obesity  Intubated  sedated  CCM onboard  Likely will need trach per CCM  Surgery consulted for placement - plan to place next week in the OR  Cerebral edema and regional mass effect   Mannitol - 25 g x 1 - 04/08/2019  Temporal Craniotomy - Dr Christella Noa - 04/17/2019  Seizure prophylaxis -> Keppra  Repeat CT Head 04/16/19 -improved air in the left frontal region but stable hemorrhage and mild surrounding edema with slight increase effacement of left lateral ventricle with 2 mm shift.    Consider hypertonic therapy if edema worsens  Atrial Fibrillation w/ RVR  Home anticoagulation:  Xarelto (rivaroxaban) daily   No AC given ICH  Hypertensive Emergency  BP on admission 194/94  Home BP meds: Hydralazine ; Cozaar ; Metoprolol  Treated with Cleviprex, now off  Current BP meds: metoprolol 50 bid  Stable  . SBP goal < 160 mmHg. . Prn labetalol   Hyperlipidemia  Home Lipid lowering medication: Lipitor 80 mg daily  LDL 75, goal < 70  Current lipid lowering medication:  hold statin given ICH  Continue statin at discharge  Dysphagia . Secondary to stroke . NPO . On tube feedings . cortrak placed 11/24 . CBG, SSI . Speech on board . Likely will need PEG   Other Stroke Risk Factors  Former cigarette smoker - quit  Previous ETOH use  Morbid Obesity, Body mass index is 53.32 kg/m., recommend weight loss, diet and exercise as appropriate   Hx stroke/TIA  Obstructive sleep apnea, not on CPAP at home  H/O Congestive Heart Failure   Other Active Problems  Difficult foley placement -> Dr Junious Silk - leave foley in place  AKI on CKD - creatinine baseline 1.7 - now 3.65  Hypokalemia K 3.4  Leukocytosis WBC 15.9  Possible bacteremia with 1/2 BCx growing GPR. Possible contaminant. Not on ABX.   Hospital day # 3  Patient remains critically ill with respiratory failure, heart failure and agitation requiring sedation ventilatory support.  He is not ready for extubation and may likely need early tracheostomy and PEG tube.  Patient remains febrile will use Tylenol and cooling blanket and if not successful may need to do targeted temperature management.  I had a long discussion with patient's wife yesterday as well as with Dr. Lynetta Mare today and agree with plan.  We will consult trauma team trach PEG early next week.This patient is critically ill and at significant risk of neurological worsening, death and care requires constant monitoring of vital signs, hemodynamics,respiratory and cardiac monitoring, extensive review of multiple databases, frequent neurological assessment, discussion with family, other specialists and medical decision making of high complexity.I have made any additions or clarifications directly to the above note.This critical care time does not reflect procedure time, or teaching time or supervisory time of PA/NP/Med Resident etc but could involve care discussion time.  I spent 35 minutes of neurocritical care time  in the care of  this  patient.     Antony Contras, MD Medical Director [facility] Pager: [phone] 04/17/2019 9:00 AM   To contact Stroke Continuity provider, please refer to [email or web address] After hours, contact General Neurology

## 2019-04-17 NOTE — Progress Notes (Signed)
PHARMACY - PHYSICIAN COMMUNICATION CRITICAL VALUE ALERT - BLOOD CULTURE IDENTIFICATION (BCID)  Jay Chen is an 53 y.o. male who presented to [facility] on 04/19/2019 with a chief complaint of ICH s/p craniotomy  Assessment:   1/2 blood cultures growing Gram positive rods.  BCID not done.  Name of physician (or Provider) Contacted: Dr. Genevive Bi  Current antibiotics: None  Changes to prescribed antibiotics recommended:   May be contaminant, but if ABX needed, consider adding Zosyn.  No results found for this or any previous visit.  [name] 04/17/2019  6:00 AM

## 2019-04-17 NOTE — Progress Notes (Signed)
OT Cancellation and Discharge Note  Patient Details Name: Jay Chen MRN: [id number] DOB: 08-26-1965   Cancelled Treatment:    Reason Eval/Treat Not Completed: Patient not medically ready.  Pt with high Fi02 needs and increased PEEP.  He is not currently medically stable for therapy interventions.  OT will sign off at this time.  Please reorder when medically appropriate.  Lucille Passy, OTR/L Acute Rehabilitation Services Pager [phone] Office [phone]   Lucille Passy M 04/17/2019, 5:56 AM

## 2019-04-17 NOTE — Progress Notes (Signed)
Chaplain has a consult for daily prayer for the patient.  The chaplain prayed for the patient and the wife.  The wife requested the chaplain pray for the patient daily whether she is there or not.  Brion Aliment Chaplain Resident For questions concerning this note please contact me by pager [phone]

## 2019-04-17 NOTE — Progress Notes (Signed)
PT Cancellation/Discharge Note  Patient Details Name: Jay Chen MRN: [id number] DOB: [birth year]   Cancelled Treatment:    Reason Eval/Treat Not Completed: Patient not medically ready   Pt with high Fi02 needs and increased PEEP. Noted ?plans to prone patient. He is not currently medically stable for therapy interventions.  PT will sign off at this time. Please reorder when medically appropriate.   Barry Brunner, PT Pager [phone]  Rexanne Mano 04/17/2019, 8:34 AM

## 2019-04-17 NOTE — Progress Notes (Signed)
LTM discontinued. No skin breakdown was seen. 

## 2019-04-17 NOTE — Consult Note (Addendum)
Jay Chen 1965/11/16  [id number].    Requesting MD: Dr. Leonie Man Chief Complaint/Reason for Consult: Lurline Idol consult  HPI: Jay Chen is a 53 y.o. male with a history of morbid obesity, OSA, DM2, A. Fib, HTN, HLD, CHF, CKD and prior CVA (2019) who presented w/ right hemiplegia, dysphagia as a code stroke and was admitted on 04/04/2019 after being found to have to have a large left temporal lobe acute hemorrhage.  Patient was intubated underwent temporal craniotomy by Dr. Christella Noa on 11/21. Follow up MRI showed ICH largely evacuated, some residual blood, persistent edema and mass effect, 1-2 mm shift. CTH following without change and stable.  Patient is remained intubated and sedated.  Per notes patient has extension to noxious stimuli, as well as "barely responsive to sternal rub with purposeful left-sided movements and trace movement in the right leg with intermittent posturing of the right upper extremity". No real neurologic change otherwise. Neurology feels patient is unlikely to be able to be extubated soon given his agitation and aphasia and concern about protecting his airway.  Patient had core track placed today and is being started on tube feeds.  Neurology is requesting tracheostomy.  ROS: Review of Systems  Unable to perform ROS: Intubated    Family History  Problem Relation Age of Onset  . Hypertension Mother   . Pancreatic cancer Father   . Prostate cancer Father   . Colon cancer Father     Past Medical History:  Diagnosis Date  . CKD (chronic kidney disease), stage III   . Congestive heart failure [facility]) May of 2011   Felt to have cor pulmonale; EF 45 to 50% from echo in May 2011  . Cor pulmonale (chronic) ([place])   . Gallstones   . Gout    "take daily RX" (02/15/2018)  . History of kidney stones   . Hyperlipidemia   . Hypertension   . Morbid obesity ([place])   . Persistent atrial fibrillation ([place])    [name] 12/28/2017  . Sleep apnea    "dx'd; couldn't tolerate  CPAP" (02/15/2018)  . Thoracic aneurysm    a. 4.8cm thoracic aortic aneurysm by CT 2013.  [name] Thoracic aortic aneurysm ([place])    known/notes 12/28/2017  . Type II diabetes mellitus ([place])     Past Surgical History:  Procedure Laterality Date  . CARDIOVERSION N/A 02/22/2018   Procedure: CARDIOVERSION;  Surgeon: Jolaine Artist, MD;  Location: [facility] ENDOSCOPY;  Service: Cardiovascular;  Laterality: N/A;  . CARDIOVERSION N/A 03/01/2018   Procedure: CARDIOVERSION;  Surgeon: Jolaine Artist, MD;  Location: [facility] ENDOSCOPY;  Service: Cardiovascular;  Laterality: N/A;  . CRANIOTOMY Left 03/25/2019   Procedure: TEMPORAL CRANIOTOMY FOR HEMATOMA;  Surgeon: Ashok Pall, MD;  Location: [place];  Service: Neurosurgery;  Laterality: Left;  . LAPAROSCOPIC CHOLECYSTECTOMY  2000  . RIGHT/LEFT HEART CATH AND CORONARY ANGIOGRAPHY N/A 02/20/2018   Procedure: RIGHT/LEFT HEART CATH AND CORONARY ANGIOGRAPHY;  Surgeon: Jolaine Artist, MD;  Location: [place] CV LAB;  Service: Cardiovascular;  Laterality: N/A;  . US ECHOCARDIOGRAPHY  09/21/2009   EF 45-50%; Cavity size was severely dilated, severe concentric hypertrophy and normal wall motion    Social History:  reports that he has quit smoking. He has never used smokeless tobacco. He reports previous alcohol use. He reports that he does not use drugs.  Allergies: No Known Allergies  Medications Prior to Admission  Medication Sig Dispense Refill  . acetaminophen (TYLENOL) 325 MG tablet Take 1-2  tablets (325-650 mg total) by mouth every 6 (six) hours as needed for mild pain.    [name] allopurinol (ZYLOPRIM) 100 MG tablet TAKE 1 TABLET(100 MG) BY MOUTH DAILY (Patient taking differently: Take 100 mg by mouth daily. ) 90 tablet 1  . amiodarone (PACERONE) 200 MG tablet Take 1 tablet (200 mg total) by mouth daily. 30 tablet 5  . aspirin EC 81 MG tablet Take 81 mg by mouth daily.    [name] atorvastatin (LIPITOR) 80 MG tablet Take 80 mg by mouth daily.    . Colchicine 0.6 MG  CAPS Take 1 capsule by mouth 2 (two) times daily. 60 capsule 1  . hydrALAZINE (APRESOLINE) 100 MG tablet Take 1 tablet (100 mg total) by mouth 3 (three) times daily. 90 tablet 6  . isosorbide mononitrate (IMDUR) 30 MG 24 hr tablet Take 1 tablet (30 mg total) by mouth daily. 30 tablet 6  . losartan (COZAAR) 50 MG tablet Take 1 tablet (50 mg total) by mouth daily. Please cancel all previous orders for current medication. Change in dosage or pill size. 30 tablet 5  . metoprolol succinate (TOPROL-XL) 100 MG 24 hr tablet Take 1 tablet (100 mg total) by mouth daily. 30 tablet 5  . omega-3 acid ethyl esters (LOVAZA) 1 g capsule Take 2 capsules (2 g total) by mouth 2 (two) times daily. 120 capsule 11  . potassium chloride SA (KLOR-CON M20) 20 MEQ tablet Take 2 tablets (40 mEq total) by mouth daily. 60 tablet 5  . rivaroxaban (XARELTO) 20 MG TABS tablet Take 20 mg by mouth daily with supper.    [name] spironolactone (ALDACTONE) 25 MG tablet Take 0.5 tablets (12.5 mg total) by mouth daily. Please cancel all previous orders for current medication. Change in dosage or pill size. 15 tablet 5  . torsemide (DEMADEX) 20 MG tablet TAKE 2 TABLETS(40 MG) BY MOUTH EVERY MORNING. MAY ALSO TAKE 1 TABLET 20 MG TOTAL AS NEEDED FOR WEIGHT 343 OR GREATER (Patient taking differently: Take 40 mg by mouth daily. May also take 1 additional tablet as needed if weight is over 343 pounds) 90 tablet 3   Vent Mode: PRVC FiO2 (%):  [60 %-100 %] 60 % Set Rate:  [16 bmp] 16 bmp Vt Set:  [450 mL-640 mL] 450 mL PEEP:  [14 cmH20-20 cmH20] 18 cmH20 Plateau Pressure:  [26 cmH20-30 cmH20] 30 cmH20   Physical Exam: Blood pressure (!) 136/94, pulse (!) 110, temperature (!) 102.3 F (39.1 C), temperature source Axillary, resp. rate 13, height 5\' 11"  (1.803 m), weight (!) 173.4 kg, SpO2 100 %. General: Intubated and sedated. Morbidly obese. No response to sternal rub.  HEENT: head is normocephalic, atraumatic.  Sclera are noninjected. ETT in  place.  Ears and nose without any masses or lesions.  Mouth is pink and moist. Neck: Large amount of subq tissue but palpable structures.  Heart: Irr, Irr. Radial pulse and DP pulses 1+ b/l Lungs: CTAB, on vent Abd: soft, protuberant, no rigidity, ND, +BS, no masses, hernias, or organomegaly MS: 1+ edema Skin: warm and dry with no masses, lesions, or rashes Neuro: Sedated Psych: Sedated   Results for orders placed or performed during the hospital encounter of 04/22/2019 (from the past 48 hour(s))  Glucose, capillary     Status: Abnormal   Collection Time: 04/15/19 12:01 PM  Result Value Ref Range   Glucose-Capillary 141 (H) 70 - 99 mg/dL  Phosphorus     Status: Abnormal   Collection Time: 04/15/19  1:36  PM  Result Value Ref Range   Phosphorus 4.8 (H) 2.5 - 4.6 mg/dL    Comment: Performed at [facility] [address] [place], [place] [address]  Glucose, capillary     Status: Abnormal   Collection Time: 04/15/19  3:54 PM  Result Value Ref Range   Glucose-Capillary 137 (H) 70 - 99 mg/dL  I-STAT 7, (LYTES, BLD GAS, ICA, H+H)     Status: Abnormal   Collection Time: 04/15/19  4:10 PM  Result Value Ref Range   pH, Arterial 7.392 7.350 - 7.450   pCO2 arterial 45.5 32.0 - 48.0 mmHg   pO2, Arterial 66.0 (L) 83.0 - 108.0 mmHg   Bicarbonate 27.6 20.0 - 28.0 mmol/L   TCO2 29 22 - 32 mmol/L   O2 Saturation 92.0 %   Acid-Base Excess 2.0 0.0 - 2.0 mmol/L   Sodium 141 135 - 145 mmol/L   Potassium 3.8 3.5 - 5.1 mmol/L   Calcium, Ion 1.14 (L) 1.15 - 1.40 mmol/L   HCT 40.0 39.0 - 52.0 %   Hemoglobin 13.6 13.0 - 17.0 g/dL   Patient temperature HIDE    Collection site ARTERIAL LINE    Drawn by RT    Sample type ARTERIAL   Glucose, capillary     Status: Abnormal   Collection Time: 04/15/19  7:46 PM  Result Value Ref Range   Glucose-Capillary 135 (H) 70 - 99 mg/dL  I-STAT 7, (LYTES, BLD GAS, ICA, H+H)     Status: Abnormal   Collection Time: 04/15/19  8:25 PM  Result Value Ref Range    pH, Arterial 7.394 7.350 - 7.450   pCO2 arterial 44.0 32.0 - 48.0 mmHg   pO2, Arterial 75.0 (L) 83.0 - 108.0 mmHg   Bicarbonate 26.8 20.0 - 28.0 mmol/L   TCO2 28 22 - 32 mmol/L   O2 Saturation 95.0 %   Acid-Base Excess 2.0 0.0 - 2.0 mmol/L   Sodium 141 135 - 145 mmol/L   Potassium 3.7 3.5 - 5.1 mmol/L   Calcium, Ion 1.11 (L) 1.15 - 1.40 mmol/L   HCT 36.0 (L) 39.0 - 52.0 %   Hemoglobin 12.2 (L) 13.0 - 17.0 g/dL   Patient temperature 99.0 F    Collection site ARTERIAL LINE    Drawn by Operator    Sample type ARTERIAL   Glucose, capillary     Status: Abnormal   Collection Time: 04/15/19 11:45 PM  Result Value Ref Range   Glucose-Capillary 113 (H) 70 - 99 mg/dL  Triglycerides     Status: None   Collection Time: 04/16/19  2:38 AM  Result Value Ref Range   Triglycerides 130 <150 mg/dL    Comment: Performed at [facility], [address], [place], [place] [id number]  Basic metabolic panel     Status: Abnormal   Collection Time: 04/16/19  2:38 AM  Result Value Ref Range   Sodium 140 135 - 145 mmol/L   Potassium 3.8 3.5 - 5.1 mmol/L   Chloride 104 98 - 111 mmol/L   CO2 26 22 - 32 mmol/L   Glucose, Bld 142 (H) 70 - 99 mg/dL   BUN 35 (H) 6 - 20 mg/dL   Creatinine, Ser 2.60 (H) 0.61 - 1.24 mg/dL   Calcium 8.1 (L) 8.9 - 10.3 mg/dL   GFR calc non Af Amer 27 (L) >60 mL/min   GFR calc Af Amer 31 (L) >60 mL/min   Anion gap 10 5 - 15  Comment: Performed at [facility], [place] [address], [place], [place] [address]  Magnesium     Status: None   Collection Time: 04/16/19  2:38 AM  Result Value Ref Range   Magnesium 2.0 1.7 - 2.4 mg/dL    Comment: Performed at [facility] [address] [place], [place] [address]  Culture, blood (routine x 2)     Status: None (Preliminary result)   Collection Time: 04/16/19  2:39 AM   Specimen: BLOOD LEFT HAND  Result Value Ref Range   Specimen Description BLOOD LEFT HAND    Special Requests      BOTTLES DRAWN AEROBIC AND  ANAEROBIC Blood Culture adequate volume   Culture  Setup Time      GRAM POSITIVE RODS AEROBIC BOTTLE ONLY CRITICAL RESULT CALLED TO, READ BACK BY AND VERIFIED WITH: PHRMD G ABBOTT @[id number]  04/17/19 BY  S GEZAHEGN Performed at [facility], [place] [address], [place], [place] [address]    Culture PENDING    Report Status PENDING   I-STAT 7, (LYTES, BLD GAS, ICA, H+H)     Status: Abnormal   Collection Time: 04/16/19  3:29 AM  Result Value Ref Range   pH, Arterial 7.385 7.350 - 7.450   pCO2 arterial 43.4 32.0 - 48.0 mmHg   pO2, Arterial 83.0 83.0 - 108.0 mmHg   Bicarbonate 25.8 20.0 - 28.0 mmol/L   TCO2 27 22 - 32 mmol/L   O2 Saturation 96.0 %   Acid-Base Excess 1.0 0.0 - 2.0 mmol/L   Sodium 140 135 - 145 mmol/L   Potassium 3.6 3.5 - 5.1 mmol/L   Calcium, Ion 1.10 (L) 1.15 - 1.40 mmol/L   HCT 34.0 (L) 39.0 - 52.0 %   Hemoglobin 11.6 (L) 13.0 - 17.0 g/dL   Patient temperature 99.3 F    Collection site ARTERIAL LINE    Drawn by Operator    Sample type ARTERIAL   Glucose, capillary     Status: Abnormal   Collection Time: 04/16/19  3:32 AM  Result Value Ref Range   Glucose-Capillary 127 (H) 70 - 99 mg/dL  Glucose, capillary     Status: Abnormal   Collection Time: 04/16/19  8:31 AM  Result Value Ref Range   Glucose-Capillary 123 (H) 70 - 99 mg/dL   Comment 1 Notify RN    Comment 2 Document in Chart   Glucose, capillary     Status: Abnormal   Collection Time: 04/16/19 11:52 AM  Result Value Ref Range   Glucose-Capillary 110 (H) 70 - 99 mg/dL   Comment 1 Notify RN    Comment 2 Document in Chart   Glucose, capillary     Status: Abnormal   Collection Time: 04/16/19  3:42 PM  Result Value Ref Range   Glucose-Capillary 143 (H) 70 - 99 mg/dL   Comment 1 Notify RN    Comment 2 Document in Chart   Magnesium     Status: None   Collection Time: 04/16/19  4:13 PM  Result Value Ref Range   Magnesium 2.0 1.7 - 2.4 mg/dL    Comment: Performed at [facility], [address], [place], [place] [address]  Phosphorus     Status: Abnormal   Collection Time: 04/16/19  4:13 PM  Result Value Ref Range   Phosphorus 5.4 (H) 2.5 - 4.6 mg/dL    Comment: Performed at [facility] [address] [place], [place] [address]  Glucose, capillary  Status: Abnormal   Collection Time: 04/16/19  7:59 PM  Result Value Ref Range   Glucose-Capillary 134 (H) 70 - 99 mg/dL  Glucose, capillary     Status: None   Collection Time: 04/16/19  9:50 PM  Result Value Ref Range   Glucose-Capillary 77 70 - 99 mg/dL  Glucose, capillary     Status: Abnormal   Collection Time: 04/16/19 11:35 PM  Result Value Ref Range   Glucose-Capillary 104 (H) 70 - 99 mg/dL  Triglycerides     Status: None   Collection Time: 04/17/19  3:09 AM  Result Value Ref Range   Triglycerides 127 <150 mg/dL    Comment: Performed at [facility], [place] [address] [place], [place] [address]  Magnesium     Status: None   Collection Time: 04/17/19  3:09 AM  Result Value Ref Range   Magnesium 2.0 1.7 - 2.4 mg/dL    Comment: Performed at [facility] [address] [place], [place] [id number]  Basic metabolic panel     Status: Abnormal   Collection Time: 04/17/19  3:09 AM  Result Value Ref Range   Sodium 139 135 - 145 mmol/L   Potassium 3.3 (L) 3.5 - 5.1 mmol/L   Chloride 104 98 - 111 mmol/L   CO2 25 22 - 32 mmol/L   Glucose, Bld 129 (H) 70 - 99 mg/dL   BUN 60 (H) 6 - 20 mg/dL   Creatinine, Ser 3.65 (H) 0.61 - 1.24 mg/dL    Comment: DELTA CHECK NOTED   Calcium 8.0 (L) 8.9 - 10.3 mg/dL   GFR calc non Af Amer 18 (L) >60 mL/min   GFR calc Af Amer 20 (L) >60 mL/min   Anion gap 10 5 - 15    Comment: Performed at [facility] [address] [place], [place] [address]  CBC with Differential/Platelet     Status: Abnormal   Collection Time: 04/17/19  3:09 AM  Result Value Ref Range   WBC 15.9 (H) 4.0 - 10.5 K/uL   RBC 3.66 (L) 4.22 - 5.81 MIL/uL   Hemoglobin 11.1 (L) 13.0 - 17.0 g/dL    HCT 35.0 (L) 39.0 - 52.0 %   MCV 95.6 80.0 - 100.0 fL   MCH 30.3 26.0 - 34.0 pg   MCHC 31.7 30.0 - 36.0 g/dL   RDW 14.4 11.5 - 15.5 %   Platelets 168 150 - 400 K/uL   nRBC 0.0 0.0 - 0.2 %   Neutrophils Relative % 82 %   Neutro Abs 13.1 (H) 1.7 - 7.7 K/uL   Lymphocytes Relative 7 %   Lymphs Abs 1.0 0.7 - 4.0 K/uL   Monocytes Relative 10 %   Monocytes Absolute 1.6 (H) 0.1 - 1.0 K/uL   Eosinophils Relative 0 %   Eosinophils Absolute 0.1 0.0 - 0.5 K/uL   Basophils Relative 0 %   Basophils Absolute 0.0 0.0 - 0.1 K/uL   Immature Granulocytes 1 %   Abs Immature Granulocytes 0.10 (H) 0.00 - 0.07 K/uL    Comment: Performed at [facility] [address] [place], [place] [address]  Phosphorus     Status: Abnormal   Collection Time: 04/17/19  3:09 AM  Result Value Ref Range   Phosphorus 6.4 (H) 2.5 - 4.6 mg/dL    Comment: Performed at [facility] [address], [place], [place] [address]  I-STAT 7, (LYTES, BLD GAS, ICA, H+H)  Status: Abnormal   Collection Time: 04/17/19  3:14 AM  Result Value Ref Range   pH, Arterial 7.335 (L) 7.350 - 7.450   pCO2 arterial 47.5 32.0 - 48.0 mmHg   pO2, Arterial 59.0 (L) 83.0 - 108.0 mmHg   Bicarbonate 25.0 20.0 - 28.0 mmol/L   TCO2 26 22 - 32 mmol/L   O2 Saturation 86.0 %   Acid-base deficit 1.0 0.0 - 2.0 mmol/L   Sodium 140 135 - 145 mmol/L   Potassium 3.4 (L) 3.5 - 5.1 mmol/L   Calcium, Ion 1.11 (L) 1.15 - 1.40 mmol/L   HCT 33.0 (L) 39.0 - 52.0 %   Hemoglobin 11.2 (L) 13.0 - 17.0 g/dL   Patient temperature 100.4 F    Sample type ARTERIAL   Glucose, capillary     Status: Abnormal   Collection Time: 04/17/19  3:26 AM  Result Value Ref Range   Glucose-Capillary 148 (H) 70 - 99 mg/dL  I-STAT 7, (LYTES, BLD GAS, ICA, H+H)     Status: Abnormal   Collection Time: 04/17/19  4:59 AM  Result Value Ref Range   pH, Arterial 7.280 (L) 7.350 - 7.450   pCO2 arterial 57.3 (H) 32.0 - 48.0 mmHg   pO2, Arterial 134.0 (H) 83.0 - 108.0 mmHg    Bicarbonate 26.6 20.0 - 28.0 mmol/L   TCO2 28 22 - 32 mmol/L   O2 Saturation 98.0 %   Acid-base deficit 1.0 0.0 - 2.0 mmol/L   Sodium 140 135 - 145 mmol/L   Potassium 3.4 (L) 3.5 - 5.1 mmol/L   Calcium, Ion 1.14 (L) 1.15 - 1.40 mmol/L   HCT 34.0 (L) 39.0 - 52.0 %   Hemoglobin 11.6 (L) 13.0 - 17.0 g/dL   Patient temperature 100.4 F    Collection site ARTERIAL LINE    Drawn by RT    Sample type ARTERIAL   Glucose, capillary     Status: Abnormal   Collection Time: 04/17/19  8:05 AM  Result Value Ref Range   Glucose-Capillary 143 (H) 70 - 99 mg/dL   Comment 1 Notify RN    Comment 2 Document in Chart    Dg Abd 1 View  Result Date: 04/15/2019 CLINICAL DATA:  Orogastric tube placement EXAM: ABDOMEN - 1 VIEW COMPARISON:  CT abdomen pelvis dated 12/28/2017. FINDINGS: An enteric tube enters the stomach and terminates at the pylorus or first portion of the duodenum. IMPRESSION: Enteric tube terminates at the pylorus or first portion of the duodenum. Electronically Signed   By: Zerita Boers M.D.   On: 04/15/2019 12:58   Ct Head Wo Contrast  Result Date: 04/16/2019 CLINICAL DATA:  Intracranial hemorrhage EXAM: CT HEAD WITHOUT CONTRAST TECHNIQUE: Contiguous axial images were obtained from the base of the skull through the vertex without intravenous contrast. COMPARISON:  April 15, 2019 FINDINGS: Brain: There remains air in the anterior left frontal region with localized mass effect on the anterior aspect of the left frontal lobe, with marginally less air in this region compared to 1 day prior. There is less air within the area of hemorrhage in the left temporal region. The overall distribution of hemorrhage in the left temporal lobe is essentially stable compared to 1 day prior with the area of hemorrhage covering and extent of 5.7 x 5.3 cm. There is mild surrounding cytotoxic appearing edema, essentially stable. There is a slightly less degree of hemorrhage within the left lateral ventricle  compared to 1 day prior. There is hemorrhage layering in the posterior  aspects of each lateral ventricle, essentially stable. A small amount of subarachnoid hemorrhage near the sylvian fissure on the right is again noted with slight dispersion of hemorrhage in this area compared to 1 day prior. No new areas of hemorrhage are evident. In comparison with 1 day prior, there is an increase in effacement of the left lateral ventricle. There is 2 mm of midline shift to the right. No subdural or epidural fluid collections. No new brain parenchymal lesion. Vascular: No hyperdense vessels.  No vascular lesions evident. Skull: Status post left-sided temporal craniotomy, stable. Bony calvarium otherwise appears intact. Sinuses/Orbits: There is an air-fluid level in the sphenoid sinus region. There is opacification in the anterior sphenoid sinus regions as well as in multiple ethmoid air cells. Orbits appear symmetric bilaterally. Other: There are scattered areas of mastoid air cell opacification bilaterally. IMPRESSION: Somewhat less air in the area of hemorrhage and in the left frontal regions compared to 1 day prior. Extent of hemorrhage is virtually identical with marginally less hemorrhage seen in the left lateral ventricle compared to 1 day prior. Stable mild surrounding edema in the left temporal region. There is currently slight increased effacement of the left lateral ventricle compared to 1 day prior, best seen on coronal images. There is 2 mm of midline shift to the right. No new brain parenchymal lesion evident. Foci of paranasal sinus disease and mastoid disease noted. Electronically Signed   By: Lowella Grip III M.D.   On: 04/16/2019 08:15   Ct Head Wo Contrast  Result Date: 04/15/2019 CLINICAL DATA:  Follow-up cerebral hematoma. EXAM: CT HEAD WITHOUT CONTRAST TECHNIQUE: Contiguous axial images were obtained from the base of the skull through the vertex without intravenous contrast. COMPARISON:  Head CT  and brain MRI from earlier today FINDINGS: Brain: Blood clot mixed with gas in the left temporal region with stable irregular shape and extent when compared to MRI. There is intraventricular blood at the level of the left lateral ventricular body and both occipital horns, stable. Small volume subarachnoid hemorrhage is seen along the right sylvian fissure, new when compared to FLAIR, likely redistribution migration. Stable pneumocephalus anterior to the left frontal lobe. No significant midline shift and no complicating infarct. Vascular: Negative Skull: Unremarkable left-sided craniotomy Sinuses/Orbits: Opacified nasopharynx in the setting of intubation IMPRESSION: Unchanged left temporal hematoma and intraventricular clot when compared to prior MRI. Minimal subarachnoid hemorrhage is seen along the right sylvian fissure, likely redistribution. No midline shift and no ventricular obstruction. Electronically Signed   By: Monte Fantasia M.D.   On: 04/15/2019 12:43   Dg Chest Port 1 View  Result Date: 04/17/2019 CLINICAL DATA:  Hypoxia EXAM: PORTABLE CHEST 1 VIEW COMPARISON:  04/16/2019 FINDINGS: Support Apparatus: --Endotracheal tube: Tip 6.6 cm above the inferior margin of the carina. --Enteric tube:Tip and sideport are below the field of view. --Catheter(s):Right internal jugular vein approach central venous catheter tip is in the lower SVC --Other: None There increased right basilar opacities. Unchanged size of small left pleural effusion. IMPRESSION: 1. Increased right basilar opacities. Unchanged small left pleural effusion. 2. Slight retraction of endotracheal tube. Otherwise unchanged support apparatus. Electronically Signed   By: Ulyses Jarred M.D.   On: 04/17/2019 03:42   Dg Chest Port 1 View  Result Date: 04/16/2019 CLINICAL DATA:  53 year old male with history of respiratory failure and hypoxia. Evaluate endotracheal tube position. EXAM: PORTABLE CHEST 1 VIEW COMPARISON:  Chest x-ray 04/16/2019.  FINDINGS: An endotracheal tube is in place with tip 4.3  cm above the carina. There is a right-sided internal jugular central venous catheter with tip terminating in the mid superior vena cava. A nasogastric tube is seen extending into the stomach, however, the tip of the nasogastric tube extends below the lower margin of the image. Lung volumes are low with bibasilar opacities (left greater than right) which may reflect areas of atelectasis and/or consolidation. Probable small to moderate left pleural effusion. No evidence of pulmonary edema. Heart size is mildly enlarged. The patient is rotated to the left on today's exam, resulting in distortion of the mediastinal contours and reduced diagnostic sensitivity and specificity for mediastinal pathology. IMPRESSION: 1. Support apparatus, as above. 2. Worsening aeration with bibasilar areas of atelectasis and/or consolidation and small to moderate left pleural effusion. Electronically Signed   By: Vinnie Langton M.D.   On: 04/16/2019 13:32   Dg Chest Port 1 View  Result Date: 04/16/2019 CLINICAL DATA:  ETT placement EXAM: PORTABLE CHEST 1 VIEW COMPARISON:  Radiograph 04/15/2019 FINDINGS: Somewhat high positioning of the endotracheal tube approximately 8 cm from the carina. Transesophageal tube tip and side port distal to the GE junction, terminating below the level of imaging. Right IJ catheter sheath terminates at the mid SVC. Persistent dense opacity in the retrocardiac space obscuring the left hemidiaphragm. Some improving interstitial opacity is noted with diminished atelectatic change. Cardiomegaly is similar to prior. Degenerative changes are present in the imaged spine and shoulders. No acute osseous or soft tissue abnormality. IMPRESSION: 1. Somewhat high positioning of the endotracheal tube, approximately 8 cm from the carina. Could be advanced 3 cm to the mid trachea. 2. Right IJ catheter in the mid SVC. 3. Satisfactory positioning of the  transesophageal tube. 4. Unchanged dense retrocardiac opacity, likely a combination of pleural effusion and airspace disease. 5. Improving atelectasis and interstitial opacity elsewhere. These results will be called to the ordering clinician or representative by the Radiologist Assistant, and communication documented in the PACS or zVision Dashboard. Electronically Signed   By: Lovena Le M.D.   On: 04/16/2019 01:13   Anti-infectives (From admission, onward)   None       Assessment/Plan Morbid obesity OSA DM2 A. 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 Will plan for tracheostomy next week if patient is able to wean more. Given patients body habitus, this will likely be done in the OR. We will continue to follow. Wife will need to consent for patient.   Jillyn Ledger, [facility] 04/17/2019, 10:29 AM Please see Amion for pager number during day hours 7:00am-4:30pm

## 2019-04-17 NOTE — Progress Notes (Addendum)
NAME:  Jay Chen, MRN:  [id number], DOB:  1965/08/09, LOS: 3 ADMISSION DATE:  04/17/2019, CONSULTATION DATE:  04/13/2019  REFERRING MD:  Billy Fischer, CHIEF COMPLAINT:  ICH, respiratory failure  Brief History   53 yo male with PMH as below presented to the ED on 11/21 with right hemiparesis and aphasia.  Emergently to CT --> ICH --> to OR for craniotomy and hematoma evacuation.  PCCM consulted for vent management.  Past Medical History  Atrial fibrillation on xarelto, Obesity,OSA,HTN,HLD DM,CHF,CKD,Gout,Gallstones.Thoracic aortic aneurysm  Significant Hospital Events   11/21> ICH. Intubated. To OR emergently.   Consults:  PCCM NSGY  Procedures:  ETT 11/21>>> CVC 11/21>> Crani 11/21>>  Significant Diagnostic Tests:  11/21 CT Head> L cerebral ICH 5.7 x 4.1 x 4.2 cm. Local mass effect. Mild edema.  11/21 Echo> EF 55-60%, increased LVH, Grade II diastolic dysfunction, no valvular abnormalities 11/22 MRI Head> ICH largely evacuated, some residual blood, persistent edema and mass effect, 1-2 mm shift 11/22 CT Head> Hematoma unchanged from MRI, minimal subarachnoid hemorrhage 11/23 CT Head> Extent of hemorrhage unchanged, stable edema, 2 mm L to R shift 11/23 EEG > cortical dysfunction related to the [place] but no seizures. 11/24 CXR> more defined bibasilar infiltrates Micro Data:  04/15/2019 SARS CoV2> neg  Antimicrobials:  none  Interim history/subjective:  Worsening hypoxia overnight.   Objective   Blood pressure (!) 133/109, pulse (!) 33, temperature (!) 100.4 F (38 C), temperature source Axillary, resp. rate 14, height 5\' 11"  (1.803 m), weight (!) 173.4 kg, SpO2 99 %.    Vent Mode: PRVC FiO2 (%):  [90 %-100 %] 90 % Set Rate:  [16 bmp] 16 bmp Vt Set:  [450 mL-640 mL] 450 mL PEEP:  [14 cmH20-20 cmH20] 20 cmH20 Plateau Pressure:  [26 cmH20-28 cmH20] 28 cmH20   Intake/Output Summary (Last 24 hours) at 04/17/2019 0818 Last data filed at 04/17/2019 0600 Gross per 24 hour   Intake 981 ml  Output 1195 ml  Net -214 ml   Filed Weights   04/16/19 1200 04/17/19 0500  Weight: (!) 172.8 kg (!) 173.4 kg    Examination: Gen: Morbidly obese male, intubated. HENT: L temporal incision CDI, ETT in place, poor dentition, pupils 2 mm & sluggish Pulm: clear anteriorly but unable to auscultate more posteriorly.  Occasional ventilator dyssynchrony. Saturation 100% on PEEP 20. FiO2 0.85 Card: A-fib 110's, HS normal. Extremities warm.  GI: Obese, soft, hypoactive BS GU: Clear yellow urine. Neuro: sedated on fentanyl and propofol with minimal response to pain. PERL 11mm. Ext: Warm, dry, distal pulses 2+  Resolved Hospital Problem list   None  Assessment & Plan:   Assessment:  Acute hypoxic respiratory failure requiring mechanical ventilation with refractory hypoxia. Likely related to volume overload and bibasilar compressive atelectasis related to obesity with superimposed aspiration. Permanent atrial fibrillation with rapid ventricular response Uncontrolled essential hypertension requiring clevidipine titration ICH - with low calculated mortality and reasonable odds of good functional outcome. CHF DM Likely undiagnosed OSA.  AKI/CRF with rising creatinine.  Currently at peak edema window. No seizure by EEG but requiring sedation to allow for ventilator compliance thus limiting examination.  Cause of severe respiratory is unclear and requiring a fair amount of PEEP despite relatively normal CXR. May reflect chronic obesity related lung disease with low chest wall compliance. More defined RLL infiltrate suggest he may have had a more substantial aspiration event than originally thought.  Appears PEEP responsive. Slow PEEP wean.  Don't believe diuresis helped substantially as  on -243ml. Given likely aphasia and high risk body habitus, would be at high risk for extubation failure with subsequent potentiation of secondary neurological injury. Would recommend proceeding  directly to tracheostomy.   I performed a critical care echocardiogram which showed: LVH with normal LV size and function. RV mildly dilated with mildly reduced function. No septal dyssynchrony. No significant valvular lesions. Indeterminate diastolic function due AF, filling pressures appear normal. Unable to calculate RVSP but PAP likely normal by PVAT. PA seen to bifurcation with no dilatation. IVC not visualized. TDS.       Daily Goals Checklist  Pain/Anxiety/Delirium protocol (if indicated): propofol and fentanyl infusions for RASS -2,-3 until PEEP <12.  VAP protocol (if indicated): bundle in place. Respiratory support goals: Wean FiO2 to 50% then begin slow PEEP wean by 2 very 12hr Blood pressure target: SBP110-140 DVT prophylaxis: SCD only given bleed. Add UFH Nutritional status and feeding goals: tolerating tube feeds and insert Cortrak for long-term feeding GI prophylaxis: Pantoprazole. Fluid status goals: allow autoregulation. Urinary catheter: Assessment of intravascular volume Central lines: Double lumen CVC right subclavian Glucose control: euglycemic on SSI only Mobility/therapy needs: Unable to participate Antibiotic de-escalation: no antibiotics. Home medication reconciliation: Amiodarone and metoprolol restarted. Daily labs: CBC and BMP to follow rising creatinine and leukocytosis. Code Status: Full  Family Communication: Will be updated by Dr Leonie Man. Wife is amenable to tracheostomy. Disposition: ICU   CRITICAL CARE Performed by: Kipp Brood   Total critical care time: 60 minutes  Critical care time was exclusive of separately billable procedures and treating other patients.  Critical care was necessary to treat or prevent imminent or life-threatening deterioration.  Critical care was time spent personally by me on the following activities: development of treatment plan with patient and/or surrogate as well as nursing, discussions with consultants,  evaluation of patient's response to treatment, examination of patient, obtaining history from patient or surrogate, ordering and performing treatments and interventions, ordering and review of laboratory studies, ordering and review of radiographic studies, pulse oximetry, re-evaluation of patient's condition and participation in multidisciplinary rounds.  Kipp Brood, MD [facility] ICU Physician [place]  Pager: [phone] Mobile: [phone] After hours: [phone].   04/17/19 8:18 AM

## 2019-04-17 NOTE — TOC Initial Note (Signed)
Transition of Care [facility]) - Initial/Assessment Note    Patient Details  Name: Jay Chen MRN: [id number] Date of Birth: July 18, 1965  [facility] CM/SW Contact:    Ella Bodo, RN Phone Number: 04/17/2019, 2:22 PM  Clinical Narrative: Pt admitted on 03/26/2019 with Rt hemiparesis and aphasia.  CT showed ICH; patient to OR emergently for craniotomy and hematoma evacuation.  PTA, pt independent, lives at home with family.  Pt currently remains sedated and intubated.                    Expected Discharge Plan: IP Rehab Facility Barriers to Discharge: Continued Medical Work up   Patient Goals and CMS Choice        Expected Discharge Plan and Services Expected Discharge Plan: [place]   Discharge Planning Services: CM Consult   Living arrangements for the past 2 months: Single Family Home                                      Prior Living Arrangements/Services Living arrangements for the past 2 months: Single Family Home Lives with:: Spouse Patient language and need for interpreter reviewed:: Yes        Need for Family Participation in Patient Care: Yes (Comment) Care giver support system in place?: Yes (comment)   Criminal Activity/Legal Involvement Pertinent to Current Situation/Hospitalization: No - Comment as needed  Activities of Daily Living Home Assistive Devices/Equipment: Walker (specify type), Built-in shower seat, CBG Meter, Eyeglasses, Grab bars in shower ADL Screening (condition at time of admission) Patient's cognitive ability adequate to safely complete daily activities?: Yes Is the patient deaf or have difficulty hearing?: No Does the patient have difficulty seeing, even when wearing glasses/contacts?: No Does the patient have difficulty concentrating, remembering, or making decisions?: No Patient able to express need for assistance with ADLs?: Yes Does the patient have difficulty dressing or bathing?: No Independently  performs ADLs?: Yes (appropriate for developmental age) Does the patient have difficulty walking or climbing stairs?: Yes Weakness of Legs: None Weakness of Arms/Hands: None  Permission Sought/Granted                  Emotional Assessment   Attitude/Demeanor/Rapport: Unable to Assess Affect (typically observed): Unable to Assess   Alcohol / Substance Use: Not Applicable Psych Involvement: No (comment)  Admission diagnosis:   code stroke  Patient Active Problem List   Diagnosis Date Noted  . Cytotoxic brain edema ([place]) 04/16/2019  . Brain herniation ([place]) 04/16/2019  . ICH (intracerebral hemorrhage) ([place]) 03/28/2019  . Intracerebral hematoma ([place]) 03/31/2019  . Endotracheally intubated   . Acute gout due to renal impairment involving left wrist 09/20/2018  . Acute pain of left wrist 09/19/2018  . Deficiency anemia 04/04/2018  . Occult blood in stools 04/04/2018  . Idiopathic gout 04/04/2018  . B12 deficiency 04/04/2018  . Hypoalbuminemia due to protein-calorie malnutrition ([place])   . Chronic combined systolic and diastolic congestive heart failure ([place])   . PAF (paroxysmal atrial fibrillation) ([place])   . CHF (congestive heart failure), NYHA class III ([place]) 02/15/2018  . CKD (chronic kidney disease) stage 4, GFR 15-29 ml/min (HCC)   . Left temporal lobe infarction ([place])   . Morbid obesity ([place])   . Diabetes mellitus with neuropathy ([place])   . Lumbar radiculopathy   . Cervical spinal cord compression ([place])   . DCM (  dilated cardiomyopathy) ([place])   . Pure hyperglyceridemia 09/01/2017  . Hyperlipidemia with target LDL less than 100 09/11/2012  . Routine general medical examination at a health care facility 01/19/2012  . Cor pulmonale ([place]) 01/08/2011  . HTN (hypertension) 01/08/2011  . Aneurysm of thoracic aorta ([place]) 01/08/2011  . Obstructive sleep apnea 08/27/2007   PCP:  [name], MD Pharmacy:   [facility] [place], [place] [address] [place] [address] Phone: [phone] Fax: [phone]     Social Determinants of Health (SDOH) Interventions    Readmission Risk Interventions Readmission Risk Prevention Plan 03/28/2018 03/27/2018  Transportation Screening Complete -  Medication Review (RN Care Manager) Complete -  SW Recovery Care/Counseling Consult - Complete  Skilled [place] - Complete  Some recent data might be hidden   Reinaldo Raddle, RN, BSN  Trauma/Neuro ICU Case Manager [phone]

## 2019-04-17 NOTE — Progress Notes (Signed)
Patient ID: Jay Chen, male   DOB: [birth year], 53 y.o.   MRN: [id number] BP (!) 91/57   Pulse 85   Temp (!) 100.8 F (38.2 C) (Axillary)   Resp 16   Ht 5\' 11"  (1.803 m)   Wt (!) 173.4 kg   SpO2 100%   BMI 53.32 kg/m  Wound is clean and dry Remains on vent Mgmt per neurology. Making slight improvements neurologically

## 2019-04-17 NOTE — Procedures (Signed)
Cortrak  Person Inserting Tube:  Jacklynn Barnacle E, RD Tube Type:  Cortrak - 43 inches Tube Location:  Left nare Initial Placement:  Stomach Secured by: Bridle Technique Used to Measure Tube Placement:  Documented cm marking at nare/ corner of mouth Cortrak Secured At:  75 cm    Cortrak Tube Team Note:  Consult received to place a Cortrak feeding tube.   No x-ray is required. RN may begin using tube.   If the tube becomes dislodged please keep the tube and contact the Cortrak team at [email or web address] (password [email or web address]) for replacement.  If after hours and replacement cannot be delayed, place a NG tube and confirm placement with an abdominal x-ray.    Jacklynn Barnacle, MS, RD, LDN Office: [phone] Pager: [phone] After Hours/Weekend Pager: [phone]

## 2019-04-17 NOTE — Procedures (Addendum)
Patient Name: Jay Chen  MRN: [id number]  Epilepsy Attending: Lora Havens  Referring Physician/Provider: Dr. Antony Contras Duration:  04/16/2019 1216 to 04/17/2019 1216  Patient history: 53 year old male with left temporal ICH status post craniotomy.  EEG to evaluate for seizures.  Level of alertness: Comatose/sedated  AEDs during EEG study: Keppra, propofol  Technical aspects: This EEG study was done with scalp electrodes positioned according to the 10-20 International system of electrode placement. Electrical activity was acquired at a sampling rate of 500Hz  and reviewed with a high frequency filter of 70Hz  and a low frequency filter of 1Hz . EEG data were recorded continuously and digitally stored.   Description: EEG showed continuous generalized, maximal left frontotemporal, 2 to 3 Hz delta slowing admixed with 15 to 18 Hz, 2-3 uV beta activity with irregular morphology distributed symmetrically and diffusely.  Intermittent rhythmic 2 to 3 Hz generalized delta slowing was also noted.  EEG was reactive to tactile stimulation.  Hyperventilation and photic stimulation were not performed.  Abnormality -Continuous low, generalized, maximal left frontotemporal -Intermittent rhythmic slow, generalized  IMPRESSION: This study is suggestive of cortical dysfunction in left frontotemporal region likely secondary to underlying ICH.  Additionally there is evidence of severe diffuse encephalopathy, nonspecific etiology but could be secondary to sedated state.  No seizures or epileptiform discharges were seen throughout the recording.

## 2019-04-17 NOTE — Progress Notes (Signed)
PEEP found on 16.  Dropped PEEP to 14 per Sp02 of 100%.  Pt tolerating well.

## 2019-04-18 ENCOUNTER — Ambulatory Visit: Payer: BC Managed Care – PPO

## 2019-04-18 LAB — POCT I-STAT 7, (LYTES, BLD GAS, ICA,H+H)
Acid-Base Excess: 3 mmol/L — ABNORMAL HIGH (ref 0.0–2.0)
Acid-Base Excess: 3 mmol/L — ABNORMAL HIGH (ref 0.0–2.0)
Bicarbonate: 28.2 mmol/L — ABNORMAL HIGH (ref 20.0–28.0)
Bicarbonate: 28.8 mmol/L — ABNORMAL HIGH (ref 20.0–28.0)
Calcium, Ion: 1.13 mmol/L — ABNORMAL LOW (ref 1.15–1.40)
Calcium, Ion: 1.14 mmol/L — ABNORMAL LOW (ref 1.15–1.40)
HCT: 30 % — ABNORMAL LOW (ref 39.0–52.0)
HCT: 30 % — ABNORMAL LOW (ref 39.0–52.0)
Hemoglobin: 10.2 g/dL — ABNORMAL LOW (ref 13.0–17.0)
Hemoglobin: 10.2 g/dL — ABNORMAL LOW (ref 13.0–17.0)
O2 Saturation: 83 %
O2 Saturation: 89 %
Patient temperature: 101
Patient temperature: 98.6
Potassium: 3.4 mmol/L — ABNORMAL LOW (ref 3.5–5.1)
Potassium: 3.5 mmol/L (ref 3.5–5.1)
Sodium: 142 mmol/L (ref 135–145)
Sodium: 143 mmol/L (ref 135–145)
TCO2: 30 mmol/L (ref 22–32)
TCO2: 30 mmol/L (ref 22–32)
pCO2 arterial: 47.6 mmHg (ref 32.0–48.0)
pCO2 arterial: 49.7 mmHg — ABNORMAL HIGH (ref 32.0–48.0)
pH, Arterial: 7.376 (ref 7.350–7.450)
pH, Arterial: 7.38 (ref 7.350–7.450)
pO2, Arterial: 50 mmHg — ABNORMAL LOW (ref 83.0–108.0)
pO2, Arterial: 64 mmHg — ABNORMAL LOW (ref 83.0–108.0)

## 2019-04-18 LAB — GLUCOSE, CAPILLARY
Glucose-Capillary: 131 mg/dL — ABNORMAL HIGH (ref 70–99)
Glucose-Capillary: 116 mg/dL — ABNORMAL HIGH (ref 70–99)
Glucose-Capillary: 131 mg/dL — ABNORMAL HIGH (ref 70–99)
Glucose-Capillary: 119 mg/dL — ABNORMAL HIGH (ref 70–99)
Glucose-Capillary: 115 mg/dL — ABNORMAL HIGH (ref 70–99)
Glucose-Capillary: 128 mg/dL — ABNORMAL HIGH (ref 70–99)

## 2019-04-18 LAB — BLOOD GAS, ARTERIAL
Acid-Base Excess: 2.8 mmol/L — ABNORMAL HIGH (ref 0.0–2.0)
Bicarbonate: 27.3 mmol/L (ref 20.0–28.0)
FIO2: 60
O2 Saturation: 97.8 %
Patient temperature: 37
pCO2 arterial: 46 mmHg (ref 32.0–48.0)
pH, Arterial: 7.391 (ref 7.350–7.450)
pO2, Arterial: 117 mmHg — ABNORMAL HIGH (ref 83.0–108.0)

## 2019-04-18 LAB — CBC WITH DIFFERENTIAL/PLATELET
Abs Immature Granulocytes: 0.12 10*3/uL — ABNORMAL HIGH (ref 0.00–0.07)
Basophils Absolute: 0 10*3/uL (ref 0.0–0.1)
Basophils Relative: 0 %
Eosinophils Absolute: 0.2 10*3/uL (ref 0.0–0.5)
Eosinophils Relative: 1 %
HCT: 32.4 % — ABNORMAL LOW (ref 39.0–52.0)
Hemoglobin: 10.8 g/dL — ABNORMAL LOW (ref 13.0–17.0)
Immature Granulocytes: 1 %
Lymphocytes Relative: 5 %
Lymphs Abs: 0.7 10*3/uL (ref 0.7–4.0)
MCH: 32 pg (ref 26.0–34.0)
MCHC: 33.3 g/dL (ref 30.0–36.0)
MCV: 95.9 fL (ref 80.0–100.0)
Monocytes Absolute: 1.6 10*3/uL — ABNORMAL HIGH (ref 0.1–1.0)
Monocytes Relative: 11 %
Neutro Abs: 12.6 10*3/uL — ABNORMAL HIGH (ref 1.7–7.7)
Neutrophils Relative %: 82 %
Platelets: 177 10*3/uL (ref 150–400)
RBC: 3.38 MIL/uL — ABNORMAL LOW (ref 4.22–5.81)
RDW: 14.3 % (ref 11.5–15.5)
WBC: 15.3 10*3/uL — ABNORMAL HIGH (ref 4.0–10.5)
nRBC: 0 % (ref 0.0–0.2)

## 2019-04-18 LAB — BASIC METABOLIC PANEL
Anion gap: 9 (ref 5–15)
BUN: 74 mg/dL — ABNORMAL HIGH (ref 6–20)
CO2: 27 mmol/L (ref 22–32)
Calcium: 8.3 mg/dL — ABNORMAL LOW (ref 8.9–10.3)
Chloride: 106 mmol/L (ref 98–111)
Creatinine, Ser: 2.68 mg/dL — ABNORMAL HIGH (ref 0.61–1.24)
GFR calc Af Amer: 30 mL/min — ABNORMAL LOW (ref >60)
GFR calc non Af Amer: 26 mL/min — ABNORMAL LOW (ref >60)
Glucose, Bld: 137 mg/dL — ABNORMAL HIGH (ref 70–99)
Potassium: 3.5 mmol/L (ref 3.5–5.1)
Sodium: 142 mmol/L (ref 135–145)

## 2019-04-18 LAB — TRIGLYCERIDES: Triglycerides: 105 mg/dL (ref <150)

## 2019-04-18 LAB — SURGICAL PATHOLOGY

## 2019-04-18 LAB — PHOSPHORUS: Phosphorus: 4.1 mg/dL (ref 2.5–4.6)

## 2019-04-18 LAB — MAGNESIUM: Magnesium: 2.2 mg/dL (ref 1.7–2.4)

## 2019-04-18 MED ORDER — ACETAMINOPHEN 160 MG/5ML PO SOLN
650.0000 mg | ORAL | Status: DC
  Administered 2019-04-18 – 2019-04-24 (×33): 650 mg
  Filled 2019-04-18 (×34): qty 20.3

## 2019-04-18 MED ORDER — ACETAMINOPHEN 325 MG PO TABS
650.0000 mg | ORAL_TABLET | ORAL | Status: DC
  Filled 2019-04-18: qty 2

## 2019-04-18 MED ORDER — SODIUM CHLORIDE 0.9 % IV SOLN
2.0000 g | INTRAVENOUS | Status: DC
  Administered 2019-04-18 – 2019-04-23 (×6): 2 g via INTRAVENOUS
  Filled 2019-04-18 (×6): qty 2

## 2019-04-18 MED ORDER — ACETAMINOPHEN 650 MG RE SUPP
650.0000 mg | RECTAL | Status: DC
  Filled 2019-04-18: qty 1

## 2019-04-18 MED ORDER — BUSPIRONE HCL 15 MG PO TABS
30.0000 mg | ORAL_TABLET | Freq: Three times a day (TID) | ORAL | Status: DC
  Administered 2019-04-18 – 2019-04-24 (×17): 30 mg
  Filled 2019-04-18 (×17): qty 2

## 2019-04-18 MED ORDER — BUSPIRONE HCL 15 MG PO TABS
30.0000 mg | ORAL_TABLET | Freq: Three times a day (TID) | ORAL | Status: DC
  Filled 2019-04-18: qty 2

## 2019-04-18 NOTE — Progress Notes (Signed)
STROKE TEAM PROGRESS NOTE   INTERVAL HISTORY Patient remains sedated and intubated for respiratory failure and distress.  He is still requiring high PEEP of 16 for his respiratory failure.  He continues to have fever of 101 and has slight elevation in white count.  CCM plan to send tracheal secretions for culture and start antibiotics today.  Long-term EEG did not show seizure activity and was discontinued yesterday.  No family at the bedside.  Vital signs remained stable  OBJECTIVE Vitals:   04/18/19 1000 04/18/19 1100 04/18/19 1119 04/18/19 1200  BP: 126/77 96/68 96/68  (!) 87/64  Pulse: 80 74 78 75  Resp: 16 16 16 16   Temp:    (!) 100.8 F (38.2 C)  TempSrc:    Axillary  SpO2: 95% (!) 88% 92% 95%  Weight:      Height:        CBC:  Recent Labs  Lab 04/17/19 0309  04/18/19 0332 04/18/19 0352  WBC 15.9*  --   --  15.3*  NEUTROABS 13.1*  --   --  12.6*  HGB 11.1*   < > 10.2* 10.8*  HCT 35.0*   < > 30.0* 32.4*  MCV 95.6  --   --  95.9  PLT 168  --   --  177   < > = values in this interval not displayed.   Basic Metabolic Panel:  Recent Labs  Lab 04/17/19 0309  04/17/19 1709  04/18/19 0332 04/18/19 0352  NA 139   < >  --    < > 142 142  K 3.3*   < >  --    < > 3.5 3.5  CL 104  --   --   --   --  106  CO2 25  --   --   --   --  27  GLUCOSE 129*  --   --   --   --  137*  BUN 60*  --   --   --   --  74*  CREATININE 3.65*  --   --   --   --  2.68*  CALCIUM 8.0*  --   --   --   --  8.3*  MG 2.0  --  2.0  --   --  2.2  PHOS 6.4*  --  5.9*  --   --  4.1   < > = values in this interval not displayed.   Lipid Panel:     Component Value Date/Time   CHOL 116 01/01/2018 0159   TRIG 105 04/18/2019 0352   HDL 24 (L) 01/01/2018 0159   CHOLHDL 4.8 01/01/2018 0159   VLDL 17 01/01/2018 0159   LDLCALC 75 01/01/2018 0159   HgbA1c:  Lab Results  Component Value Date   HGBA1C 5.8 (H) 04/15/2019   Urine Drug Screen: No results found for: LABOPIA, COCAINSCRNUR, LABBENZ,  AMPHETMU, THCU, LABBARB  Alcohol Level No results found for: [place] Code Stroke Wo Contrast 04/23/2019 IMPRESSION:  49 cc hematoma in the left cerebral white matter as described. White matter infarct and small lobar micro hemorrhages were seen on a 2019 brain MRI.   Ct Code Stroke Cta Head W/wo Contrast 04/06/2019 IMPRESSION:  1. No evidence of aneurysm, vascular malformation, or vasculitis.  2. There are areas of CTA spot sign which increase changes of hematoma growth.   Mr Brain Wo Contrast 04/15/2019 IMPRESSION:  1. Postoperative changes from interval left temporal craniotomy  for hematoma evacuation. Previously seen large left temporal lobe intraparenchymal hemorrhage has largely been evacuated, with residual blood products seen within the resection cavity. Persistent localized edema and regional mass effect with trace 1-2 mm of left-to-right shift.  2. Associated intraventricular extension with small volume hemorrhage throughout the ventricular system. No hydrocephalus or ventricular trapping.  3. Small postoperative extra-axial collection and pneumocephalus overlying the left frontotemporal convexity without significant mass effect.   CT Head WO Contrast  04/15/2019 Unchanged left temporal hematoma and intraventricular clot when compared to prior MRI. Minimal subarachnoid hemorrhage is seen along the right sylvian fissure, likely redistribution. No midline shift and no ventricular obstruction.   Ct Head Wo Contrast 04/16/2019 Somewhat less air in the area of hemorrhage and in the left frontal regions compared to 1 day prior. Extent of hemorrhage is virtually identical with marginally less hemorrhage seen in the left lateral ventricle compared to 1 day prior. Stable mild surrounding edema in the left temporal region. There is currently slight increased effacement of the left lateral ventricle compared to 1 day prior, best seen on coronal images. There is 2 mm of  midline shift to the right. No new brain parenchymal lesion evident. Foci of paranasal sinus disease and mastoid disease noted.   Dg Chest Port 1 View 04/17/2019 1. Increased right basilar opacities. Unchanged small left pleural effusion. 2. Slight retraction of endotracheal tube. Otherwise unchanged support apparatus. 04/16/2019 1. Somewhat high positioning of the endotracheal tube, approximately 8 cm from the carina. Could be advanced 3 cm to the mid trachea. 2. Right IJ catheter in the mid SVC. 3. Satisfactory positioning of the transesophageal tube. 4. Unchanged dense retrocardiac opacity, likely a combination of pleural effusion and airspace disease. 5. Improving atelectasis and interstitial opacity elsewhere.  04/11/2019 1. New right IJ central venous catheter with the tip overlying the mid SVC.  2. No evidence of pneumothorax or other complication.  3. Overall improved aeration with decreased right basilar atelectasis and decreased interstitial edema versus vascular crowding.  4. Persistent dense left basilar airspace opacity.  03/31/2019 Endotracheal tube tip 3.7 cm above the carina. Low volume film with cardiomegaly, vascular congestion and interstitial pulmonary edema. Bibasilar collapse/consolidation, left greater than right.   Transthoracic Echocardiogram  03/29/2019  1. Left ventricular ejection fraction, by visual estimation, is 55 to 60%. The left ventricle has normal function. There is severely increased left ventricular hypertrophy.  2. Elevated left atrial pressure.  3. Left ventricular diastolic parameters are consistent with Grade II diastolic dysfunction (pseudonormalization).  4. The left ventricle has no regional wall motion abnormalities.  5. Global right ventricle has normal systolic function.The right ventricular size is normal. No increase in right ventricular wall thickness.  6. Left atrial size was mildly dilated.  7. Right atrial size was normal.  8. Small  pericardial effusion.  9. The pericardial effusion is circumferential. 10. The mitral valve is normal in structure. No evidence of mitral valve regurgitation. 11. The tricuspid valve is normal in structure. Tricuspid valve regurgitation is not demonstrated. 12. The aortic valve is normal in structure. Aortic valve regurgitation is trivial. 13. The pulmonic valve was normal in structure. Pulmonic valve regurgitation is not visualized. 14. Aortic dilatation noted. 15. There is mild dilatation of the aortic root measuring 42 mm. 16. TR signal is inadequate for assessing pulmonary artery systolic pressure. 17. The inferior vena cava is dilated in size with <50% respiratory variability, suggesting right atrial pressure of 15 mmHg.  Transthoracic Echocardiogram  04/17/2019 By  CCM. LVH with normal LV size and function. RV mildly dilated with mildly reduced function. No septal dyssynchrony. No significant valvular lesions. Indeterminate diastolic function due AF, filling pressures appear normal. Unable to calculate RVSP but PAP likely normal by PVAT. PA seen to bifurcation with no dilatation. IVC not visualized. TDS.  ECG - SR rate 61 BPM. (See cardiology reading for complete details)  EEG 04/16/2019 This study is suggestive of cortical dysfunction in left frontotemporal region likely secondary to underlying ICH.  Additionally there is evidence of severe diffuse encephalopathy, nonspecific etiology but could be secondary to sedated state.  No seizures or epileptiform discharges were seen throughout the recording.  LT EEG 04/17/2019 This study issuggestive of cortical dysfunction in left frontotemporal region likely secondary to underlying ICH.Additionally there is evidence of severe diffuse encephalopathy, nonspecific etiology but could be secondary to sedated state. No seizures or epileptiform discharges were seen throughout the recording.   PHYSICAL EXAM    Obese middle-aged Caucasian male  who is sedated and intubated . [name] Afebrile. Head is nontraumatic. Neck is supple without bruit.    Cardiac exam no murmur or gallop. Lungs are clear to auscultation. Distal pulses are well felt. Neurological Exam :  Sedated and intubated.  Eyes are closed.  Barely responsive to sternal rub with partial opening of the eyes.  Semipurposeful flexion movements in the left upper and lower extremity and trace in the right lower extremity.  No response in the right upper extremity with occasional extensor posturing.  Deep tendon reflexes are depressed.  Plantars are both equivocal.   ASSESSMENT/PLAN [name] is a 53 y.o. male with history of small left temporal lobe stroke in 2019, old microhemorrhages bilaterally on prior brain MRI, morbid obesity, a-fib with RVR on Xarelto, CKD, systolic heart failure with prior EF of 20 to 25% and most recent echo with EF of 50-55% in June, hypertension, diabetes, hyperlipidemia, thoracic aortic aneurysm and OSA, presenting with acute onset of right hemiplegia and dysphasia. He did not receive IV t-PA due to [place].  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 White matter infarct and small lobar micro hemorrhages were seen on a 2019 brain MRI.   Code Stroke CTA Head - No evidence of aneurysm, vascular malformation, or vasculitis. There are areas of CTA spot sign which increase changes of hematoma growth.   MRI head - Postoperative changes from interval left temporal craniotomy for hematoma evacuation. Previously seen large left temporal lobe intraparenchymal hemorrhage has largely been evacuated, with residual blood products seen within the resection cavity. Persistent localized edema and regional mass effect with trace 1-2 mm of left-to-right shift. Associated intraventricular extension  with small volume hemorrhage throughout the ventricular system.  Repeat CT Head 04/16/19 -improved air in the left frontal region but stable hemorrhage and mild surrounding edema with slight increase effacement of left lateral ventricle with 2 mm shift.    2D Echo - EF 60 - 65%. No cardiac source of emboli identified.   Echocardiogram performed by CCM at bedside 11/24 - LVH normal. RV mildly dilated w/ reduced fxn.  EEG, LT EEG no sz, L temporal lobe cortical dysfunction  [employer] Virus 2 - negative  LDL - 75  HgbA1c - 5.8  UDS - not ordered  VTE prophylaxis - SCDs  Xarelto (rivaroxaban) daily prior to admission, now  on No antithrombotic given hemorrhage   Therapy recommendations:  pending  Disposition:  Pending  Acute Hypoxic Respiratory Failure w/ refractory hypoxia Volume Overload / Atelectasis / Aspiration in setting of obesity  Intubated  sedated  CCM onboard  For trach prior to extubation  Surgery consulted for placement - plan to place next week in the OR  Starting empiric abx today  CCM managing weaning/ventilator strategies  Cerebral edema and regional mass effect   Mannitol - 25 g x 1 - 04/06/2019  Temporal Craniotomy - Dr Christella Noa - 04/04/2019  Seizure prophylaxis -> Keppra  Repeat CT Head 04/16/19 -improved air in the left frontal region but stable hemorrhage and mild surrounding edema with slight increase effacement of left lateral ventricle with 2 mm shift.    Consider hypertonic therapy if edema worsens  Atrial Fibrillation w/ RVR  Home anticoagulation:  Xarelto (rivaroxaban) daily   No AC given ICH  Hypertensive Emergency  BP on admission 194/94  Home BP meds: Hydralazine ; Cozaar ; Metoprolol  Treated with Cleviprex, now off    Current BP meds: metoprolol 50 bid  Stable  . SBP goal < 160 mmHg. . Prn labetalol   Hyperlipidemia  Home Lipid lowering medication: Lipitor 80 mg daily  LDL 75, goal < 70  Current lipid lowering  medication: hold statin given ICH  Continue statin at discharge  Dysphagia . Secondary to stroke . NPO . On tube feedings . cortrak placed 11/24 . CBG, SSI . Speech on board . Likely will need PEG   Other Stroke Risk Factors  Former cigarette smoker - quit  Previous ETOH use  Morbid Obesity, Body mass index is 53.22 kg/m., recommend weight loss, diet and exercise as appropriate   Hx stroke/TIA  Obstructive sleep apnea, not on CPAP at home  H/O Congestive Heart Failure  Other Active Problems  Difficult foley placement -> Dr Junious Silk - leave foley in place  AKI on CKD - creatinine baseline 1.7 - now 2.68  Hypokalemia K 3.5  Leukocytosis WBC 15.3  Possible bacteremia with 1/2 BCx growing GPR. Possible contaminant. Not on ABX.   Hospital day # 4 Patient remains critically ill with respiratory failure likely multifactorial due to combination of suspected aspiration pneumonia, congestive heart failure and COPD.  Is requiring IV sedation for respiratory comfort and neurological exam is limited.  Continue sedation and management of ventilatory failure and suspected aspiration pneumonia as per critical care medicine team.  Patient's wife is not available at bedside but is clearly indicated she would be okay with tracheostomy and PEG tube which are likely necessary and will be performed early next week.  Discussed with Dr. Lynetta Mare critical care medicine This patient is critically ill and at significant risk of neurological worsening, death and care requires constant monitoring of vital signs, hemodynamics,respiratory and cardiac monitoring, extensive review of multiple databases, frequent neurological assessment, discussion with family, other specialists and medical decision making of high complexity.I have made any additions or clarifications directly to the above note.This critical care time does not reflect procedure time, or teaching time or supervisory time of PA/NP/Med Resident  etc but could involve care discussion time.  I spent 30 minutes of neurocritical care time  in the care of  this patient.     Antony Contras, MD Medical Director [facility] Pager: [phone] 04/18/2019 12:24 PM   To contact Stroke Continuity provider, please refer to [email or web address] After hours, contact General Neurology

## 2019-04-18 NOTE — Progress Notes (Signed)
Chaplain visited the patient to pray with and over the patient per. The family's request.  The chaplain will continue to pray daily for the patient.  Brion Aliment Chaplain Resident For questions concerning this note please contact me by pager [phone]

## 2019-04-18 NOTE — Progress Notes (Signed)
NAME:  Jay Chen, MRN:  [id number], DOB:  25-Oct-1965, LOS: 4 ADMISSION DATE:  04/19/2019, CONSULTATION DATE:  03/30/2019  REFERRING MD:  Billy Fischer, CHIEF COMPLAINT:  ICH, respiratory failure  Brief History   53 yo male with PMH as below presented to the ED on 11/21 with right hemiparesis and aphasia.  Emergently to CT --> ICH --> to OR for craniotomy and hematoma evacuation.  PCCM consulted for vent management.  Past Medical History  Atrial fibrillation on xarelto, Obesity,OSA,HTN,HLD DM,CHF,CKD,Gout,Gallstones.Thoracic aortic aneurysm  Significant Hospital Events   11/21> ICH. Intubated. To OR emergently.   Consults:  PCCM NSGY  Procedures:  ETT 11/21>>> CVC 11/21>> Crani 11/21>>  Significant Diagnostic Tests:  11/21 CT Head> L cerebral ICH 5.7 x 4.1 x 4.2 cm. Local mass effect. Mild edema.  11/21 Echo> EF 55-60%, increased LVH, Grade II diastolic dysfunction, no valvular abnormalities 11/22 MRI Head> ICH largely evacuated, some residual blood, persistent edema and mass effect, 1-2 mm shift 11/22 CT Head> Hematoma unchanged from MRI, minimal subarachnoid hemorrhage 11/23 CT Head> Extent of hemorrhage unchanged, stable edema, 2 mm L to R shift 11/23 EEG > cortical dysfunction related to the [place] but no seizures. 11/24 CXR> more defined bibasilar infiltrates Micro Data:  04/03/2019 SARS CoV2> neg  Antimicrobials:  none  Interim history/subjective:  Improving hypoxia, but did not tolerate rapid PEEP wean. Now productive of copious tan secretions.  Objective   Blood pressure (!) 115/57, pulse 91, temperature (!) 101.2 F (38.4 C), temperature source Axillary, resp. rate 16, height 5\' 11"  (1.803 m), weight (!) 173.1 kg, SpO2 98 %.    Vent Mode: PRVC FiO2 (%):  [40 %-60 %] 60 % Set Rate:  [16 bmp] 16 bmp Vt Set:  [450 mL-640 mL] 640 mL PEEP:  [14 cmH20-18 cmH20] 16 cmH20 Plateau Pressure:  [25 cmH20-29 cmH20] 28 cmH20   Intake/Output Summary (Last 24 hours) at  04/18/2019 0906 Last data filed at 04/18/2019 0800 Gross per 24 hour  Intake 1066.04 ml  Output 2020 ml  Net -953.96 ml   Filed Weights   04/16/19 1200 04/17/19 0500 04/18/19 0500  Weight: (!) 172.8 kg (!) 173.4 kg (!) 173.1 kg    Examination: Gen: Morbidly obese male, intubated. HENT: L temporal incision CDI, ETT in place, poor dentition, pupils 2 mm & sluggish Pulm: clear anteriorly but unable to auscultate more posteriorly.  Occasional ventilator dyssynchrony. Saturation 97% on PEEP 16. FiO2 0.50 Card: A-fib 70's, HS normal. Extremities warm.  GI: Obese, soft, hypoactive BS GU: Clear yellow urine. Neuro: sedated on fentanyl and propofol with minimal response to pain. PERL 32mm. Ext: Warm, dry, distal pulses 2+  Resolved Hospital Problem list   None  Assessment & Plan:   Assessment:  Acute hypoxic respiratory failure requiring mechanical ventilation with refractory hypoxia. Likely related to volume overload and bibasilar compressive atelectasis related to obesity with superimposed aspiration. Permanent atrial fibrillation with rapid ventricular response Uncontrolled essential hypertension requiring clevidipine titration ICH - with low calculated mortality and reasonable odds of good functional outcome. CHF DM Likely undiagnosed OSA.  AKI/CRF with rising creatinine.  Course complicated by aspiration pneumonia. Challenge to ventilate given body habitus and significant dependent lung zone pathology. Empiric antibiotics, chest PT and slow PEEP wean. Sedation strategy to prioritize ventilator synchrony over neurological assessment.   Will need tracheostomy as unacceptably high airway risk.   Daily Goals Checklist  Pain/Anxiety/Delirium protocol (if indicated): propofol and fentanyl infusions for RASS -2,-3 until PEEP <12.  VAP  protocol (if indicated): bundle in place. Respiratory support goals: Wean FiO2 to 50% then begin slow PEEP wean by 2 very 12hr Blood pressure  target: SBP110-140 DVT prophylaxis: SCD only given bleed. Add UFH Nutritional status and feeding goals: tolerating tube feeds and insert Cortrak for long-term feeding GI prophylaxis: Pantoprazole. Fluid status goals: allow autoregulation. Lasix daily based on daily volume assessment. Urinary catheter: Assessment of intravascular volume Central lines: Double lumen CVC right subclavian Glucose control: euglycemic on SSI only Mobility/therapy needs: Unable to participate Antibiotic de-escalation: empiric ceftriaxone Home medication reconciliation: Amiodarone and metoprolol restarted. Daily labs: CBC and BMP to follow rising creatinine and leukocytosis. Code Status: Full  Family Communication: Will be updated by Dr Leonie Man. Wife is amenable to tracheostomy. Disposition: ICU   CRITICAL CARE Performed by: Kipp Brood   Total critical care time: 40 minutes  Critical care time was exclusive of separately billable procedures and treating other patients.  Critical care was necessary to treat or prevent imminent or life-threatening deterioration.  Critical care was time spent personally by me on the following activities: development of treatment plan with patient and/or surrogate as well as nursing, discussions with consultants, evaluation of patient's response to treatment, examination of patient, obtaining history from patient or surrogate, ordering and performing treatments and interventions, ordering and review of laboratory studies, ordering and review of radiographic studies, pulse oximetry, re-evaluation of patient's condition and participation in multidisciplinary rounds.  Kipp Brood, MD [facility] ICU Physician [place]  Pager: [phone] Mobile: [phone] After hours: [phone].   04/18/19 9:06 AM

## 2019-04-18 NOTE — Procedures (Signed)
Patient Name:Jay Chen [id number] Epilepsy Attending:Anaika Santillano Barbra Sarks Referring Physician/Provider:Dr. Antony Contras Duration: 04/17/2019 1216 to 04/17/2019 1549  Patient history:53 year old male with left temporal ICH status post craniotomy. EEG to evaluate for seizures.  Level of alertness:Comatose/sedated  AEDs during EEG study:Keppra, propofol  Technical aspects: This EEG study was done with scalp electrodes positioned according to the 10-20 International system of electrode placement. Electrical activity was acquired at a sampling rate of 500Hz  and reviewed with a high frequency filter of 70Hz  and a low frequency filter of 1Hz . EEG data were recorded continuously and digitally stored.  Description: EEG showed continuous generalized,maximal left frontotemporal,2 to 3 Hz delta slowing admixed with 15 to 18 Hz, 2-3 uV beta activity with irregular morphology distributed symmetrically and diffusely.Intermittent rhythmic 2 to 3 Hz generalized delta slowing was also noted. EEG was reactive to tactile stimulation. Hyperventilation and photic stimulation were not performed.  Abnormality -Continuous low, generalized, maximal left frontotemporal -Intermittent rhythmic slow, generalized  IMPRESSION: This study issuggestive of cortical dysfunction in left frontotemporal region likely secondary to underlying ICH.Additionally there is evidence of severe diffuse encephalopathy, nonspecific etiology but could be secondary to sedated state. No seizures or epileptiform discharges were seen throughout the recording.

## 2019-04-19 ENCOUNTER — Inpatient Hospital Stay (HOSPITAL_COMMUNITY): Payer: BC Managed Care – PPO

## 2019-04-19 DIAGNOSIS — Z978 Presence of other specified devices: Secondary | ICD-10-CM

## 2019-04-19 DIAGNOSIS — G935 Compression of brain: Secondary | ICD-10-CM

## 2019-04-19 DIAGNOSIS — G936 Cerebral edema: Secondary | ICD-10-CM

## 2019-04-19 LAB — CBC WITH DIFFERENTIAL/PLATELET
Abs Immature Granulocytes: 0.07 10*3/uL (ref 0.00–0.07)
Basophils Absolute: 0 10*3/uL (ref 0.0–0.1)
Basophils Relative: 0 %
Eosinophils Absolute: 0.3 10*3/uL (ref 0.0–0.5)
Eosinophils Relative: 3 %
HCT: 30.6 % — ABNORMAL LOW (ref 39.0–52.0)
Hemoglobin: 9.6 g/dL — ABNORMAL LOW (ref 13.0–17.0)
Immature Granulocytes: 1 %
Lymphocytes Relative: 9 %
Lymphs Abs: 1 10*3/uL (ref 0.7–4.0)
MCH: 30.7 pg (ref 26.0–34.0)
MCHC: 31.4 g/dL (ref 30.0–36.0)
MCV: 97.8 fL (ref 80.0–100.0)
Monocytes Absolute: 1.5 10*3/uL — ABNORMAL HIGH (ref 0.1–1.0)
Monocytes Relative: 14 %
Neutro Abs: 7.7 10*3/uL (ref 1.7–7.7)
Neutrophils Relative %: 73 %
Platelets: 188 10*3/uL (ref 150–400)
RBC: 3.13 MIL/uL — ABNORMAL LOW (ref 4.22–5.81)
RDW: 14.3 % (ref 11.5–15.5)
WBC: 10.5 10*3/uL (ref 4.0–10.5)
nRBC: 0 % (ref 0.0–0.2)

## 2019-04-19 LAB — POCT I-STAT 7, (LYTES, BLD GAS, ICA,H+H)
Acid-Base Excess: 4 mmol/L — ABNORMAL HIGH (ref 0.0–2.0)
Bicarbonate: 30 mmol/L — ABNORMAL HIGH (ref 20.0–28.0)
Calcium, Ion: 1.17 mmol/L (ref 1.15–1.40)
HCT: 28 % — ABNORMAL LOW (ref 39.0–52.0)
Hemoglobin: 9.5 g/dL — ABNORMAL LOW (ref 13.0–17.0)
O2 Saturation: 92 %
Patient temperature: 37
Potassium: 3.5 mmol/L (ref 3.5–5.1)
Sodium: 144 mmol/L (ref 135–145)
TCO2: 31 mmol/L (ref 22–32)
pCO2 arterial: 49.6 mmHg — ABNORMAL HIGH (ref 32.0–48.0)
pH, Arterial: 7.389 (ref 7.350–7.450)
pO2, Arterial: 66 mmHg — ABNORMAL LOW (ref 83.0–108.0)

## 2019-04-19 LAB — GLUCOSE, CAPILLARY
Glucose-Capillary: 109 mg/dL — ABNORMAL HIGH (ref 70–99)
Glucose-Capillary: 106 mg/dL — ABNORMAL HIGH (ref 70–99)
Glucose-Capillary: 128 mg/dL — ABNORMAL HIGH (ref 70–99)
Glucose-Capillary: 123 mg/dL — ABNORMAL HIGH (ref 70–99)
Glucose-Capillary: 113 mg/dL — ABNORMAL HIGH (ref 70–99)
Glucose-Capillary: 124 mg/dL — ABNORMAL HIGH (ref 70–99)

## 2019-04-19 LAB — BASIC METABOLIC PANEL
Anion gap: 12 (ref 5–15)
BUN: 98 mg/dL — ABNORMAL HIGH (ref 6–20)
CO2: 29 mmol/L (ref 22–32)
Calcium: 8.3 mg/dL — ABNORMAL LOW (ref 8.9–10.3)
Chloride: 104 mmol/L (ref 98–111)
Creatinine, Ser: 2.85 mg/dL — ABNORMAL HIGH (ref 0.61–1.24)
GFR calc Af Amer: 28 mL/min — ABNORMAL LOW (ref >60)
GFR calc non Af Amer: 24 mL/min — ABNORMAL LOW (ref >60)
Glucose, Bld: 113 mg/dL — ABNORMAL HIGH (ref 70–99)
Potassium: 3.5 mmol/L (ref 3.5–5.1)
Sodium: 145 mmol/L (ref 135–145)

## 2019-04-19 LAB — CULTURE, BLOOD (ROUTINE X 2): Special Requests: ADEQUATE

## 2019-04-19 LAB — TRIGLYCERIDES: Triglycerides: 101 mg/dL (ref <150)

## 2019-04-19 NOTE — Progress Notes (Signed)
STROKE TEAM PROGRESS NOTE   INTERVAL HISTORY Patient remains quite sick with respiratory failure requiring high PEEP and FiO2 to maintain his saturations and he needs to be on sedation to help with his ventilatory status.  Neurological exam is limited due to sedation.  He has been started on antibiotics and chest x-ray shows right lower lobe infiltrates.  White count and temperature have come down with antibiotics  OBJECTIVE Vitals:   04/19/19 0600 04/19/19 0700 04/19/19 0800 04/19/19 0809  BP: 101/74 104/74 107/67 107/67  Pulse: 77 96 75 85  Resp: 16 16 16 16   Temp: 98.4 F (36.9 C) 99.3 F (37.4 C) 99.3 F (37.4 C)   TempSrc:      SpO2: 97% 96% 97% 95%  Weight:      Height:        CBC:  Recent Labs  Lab 04/18/19 0352 04/19/19 0336 04/19/19 0411  WBC 15.3*  --  10.5  NEUTROABS 12.6*  --  7.7  HGB 10.8* 9.5* 9.6*  HCT 32.4* 28.0* 30.6*  MCV 95.9  --  97.8  PLT 177  --  0000000   Basic Metabolic Panel:  Recent Labs  Lab 04/17/19 1709  04/18/19 0352 04/19/19 0336 04/19/19 0411  NA  --    < > 142 144 145  K  --    < > 3.5 3.5 3.5  CL  --   --  106  --  104  CO2  --   --  27  --  29  GLUCOSE  --   --  137*  --  113*  BUN  --   --  74*  --  98*  CREATININE  --   --  2.68*  --  2.85*  CALCIUM  --   --  8.3*  --  8.3*  MG 2.0  --  2.2  --   --   PHOS 5.9*  --  4.1  --   --    < > = values in this interval not displayed.   Lipid Panel:     Component Value Date/Time   CHOL 116 01/01/2018 0159   TRIG 101 04/19/2019 0411   HDL 24 (L) 01/01/2018 0159   CHOLHDL 4.8 01/01/2018 0159   VLDL 17 01/01/2018 0159   LDLCALC 75 01/01/2018 0159   HgbA1c:  Lab Results  Component Value Date   HGBA1C 5.8 (H) 04/15/2019   Urine Drug Screen: No results found for: LABOPIA, COCAINSCRNUR, LABBENZ, AMPHETMU, THCU, LABBARB  Alcohol Level No results found for: [place] Head Code Stroke Wo Contrast 04/17/2019 IMPRESSION:  49 cc hematoma in the left cerebral white matter  as described. White matter infarct and small lobar micro hemorrhages were seen on a 2019 brain MRI.   Ct Code Stroke Cta Head W/wo Contrast 04/15/2019 IMPRESSION:  1. No evidence of aneurysm, vascular malformation, or vasculitis.  2. There are areas of CTA spot sign which increase changes of hematoma growth.   Mr Brain Wo Contrast 04/15/2019 IMPRESSION:  1. Postoperative changes from interval left temporal craniotomy for hematoma evacuation. Previously seen large left temporal lobe intraparenchymal hemorrhage has largely been evacuated, with residual blood products seen within the resection cavity. Persistent localized edema and regional mass effect with trace 1-2 mm of left-to-right shift.  2. Associated intraventricular extension with small volume hemorrhage throughout the ventricular system. No hydrocephalus or ventricular trapping.  3. Small postoperative extra-axial collection and pneumocephalus overlying the left frontotemporal convexity without  significant mass effect.   CT Head WO Contrast  04/15/2019 Unchanged left temporal hematoma and intraventricular clot when compared to prior MRI. Minimal subarachnoid hemorrhage is seen along the right sylvian fissure, likely redistribution. No midline shift and no ventricular obstruction.   Ct Head Wo Contrast 04/16/2019 Somewhat less air in the area of hemorrhage and in the left frontal regions compared to 1 day prior. Extent of hemorrhage is virtually identical with marginally less hemorrhage seen in the left lateral ventricle compared to 1 day prior. Stable mild surrounding edema in the left temporal region. There is currently slight increased effacement of the left lateral ventricle compared to 1 day prior, best seen on coronal images. There is 2 mm of midline shift to the right. No new brain parenchymal lesion evident. Foci of paranasal sinus disease and mastoid disease noted.   Dg Chest Port 1 View 04/19/2019 1. Mild stable cardiomegaly  with findings suggesting mild interstitial edema. Hazy density right base with slight interval improvement likely part of the interstitial edema although infection is possible.  04/17/2019 1. Increased right basilar opacities. Unchanged small left pleural effusion. 2. Slight retraction of endotracheal tube. Otherwise unchanged support apparatus. 04/16/2019 1. Somewhat high positioning of the endotracheal tube, approximately 8 cm from the carina. Could be advanced 3 cm to the mid trachea. 2. Right IJ catheter in the mid SVC. 3. Satisfactory positioning of the transesophageal tube. 4. Unchanged dense retrocardiac opacity, likely a combination of pleural effusion and airspace disease. 5. Improving atelectasis and interstitial opacity elsewhere.  04/18/2019 1. New right IJ central venous catheter with the tip overlying the mid SVC.  2. No evidence of pneumothorax or other complication.  3. Overall improved aeration with decreased right basilar atelectasis and decreased interstitial edema versus vascular crowding.  4. Persistent dense left basilar airspace opacity.  03/29/2019 Endotracheal tube tip 3.7 cm above the carina. Low volume film with cardiomegaly, vascular congestion and interstitial pulmonary edema. Bibasilar collapse/consolidation, left greater than right.   Transthoracic Echocardiogram  04/10/2019  1. Left ventricular ejection fraction, by visual estimation, is 55 to 60%. The left ventricle has normal function. There is severely increased left ventricular hypertrophy.  2. Elevated left atrial pressure.  3. Left ventricular diastolic parameters are consistent with Grade II diastolic dysfunction (pseudonormalization).  4. The left ventricle has no regional wall motion abnormalities.  5. Global right ventricle has normal systolic function.The right ventricular size is normal. No increase in right ventricular wall thickness.  6. Left atrial size was mildly dilated.  7. Right atrial size was  normal.  8. Small pericardial effusion.  9. The pericardial effusion is circumferential. 10. The mitral valve is normal in structure. No evidence of mitral valve regurgitation. 11. The tricuspid valve is normal in structure. Tricuspid valve regurgitation is not demonstrated. 12. The aortic valve is normal in structure. Aortic valve regurgitation is trivial. 13. The pulmonic valve was normal in structure. Pulmonic valve regurgitation is not visualized. 14. Aortic dilatation noted. 15. There is mild dilatation of the aortic root measuring 42 mm. 16. TR signal is inadequate for assessing pulmonary artery systolic pressure. 17. The inferior vena cava is dilated in size with <50% respiratory variability, suggesting right atrial pressure of 15 mmHg.  Transthoracic Echocardiogram  04/17/2019 By CCM. LVH with normal LV size and function. RV mildly dilated with mildly reduced function. No septal dyssynchrony. No significant valvular lesions. Indeterminate diastolic function due AF, filling pressures appear normal. Unable to calculate RVSP but PAP likely normal by  PVAT. PA seen to bifurcation with no dilatation. IVC not visualized. TDS.  ECG - SR rate 61 BPM. (See cardiology reading for complete details)  EEG 04/16/2019 This study is suggestive of cortical dysfunction in left frontotemporal region likely secondary to underlying ICH.  Additionally there is evidence of severe diffuse encephalopathy, nonspecific etiology but could be secondary to sedated state.  No seizures or epileptiform discharges were seen throughout the recording.  LT EEG 04/17/2019 This study issuggestive of cortical dysfunction in left frontotemporal region likely secondary to underlying ICH.Additionally there is evidence of severe diffuse encephalopathy, nonspecific etiology but could be secondary to sedated state. No seizures or epileptiform discharges were seen throughout the recording.   PHYSICAL EXAM      Obese  middle-aged Caucasian male who is sedated and intubated . [name] Afebrile. Head is nontraumatic. Neck is supple without bruit.    Cardiac exam no murmur or gallop. Lungs are clear to auscultation. Distal pulses are well felt. Neurological Exam :  Sedated and intubated.  Eyes are closed.  Barely responsive to sternal rub with partial opening of the eyes.  Semipurposeful flexion movements in the left upper and lower extremity and trace in the right lower extremity.  No response in the right upper extremity with occasional extensor posturing.  Deep tendon reflexes are depressed.  Plantars are both equivocal.   ASSESSMENT/PLAN [name] is a 53 y.o. male with history of small left temporal lobe stroke in 2019, old microhemorrhages bilaterally on prior brain MRI, morbid obesity, a-fib with RVR on Xarelto, CKD, systolic heart failure with prior EF of 20 to 25% and most recent echo with EF of 50-55% in June, hypertension, diabetes, hyperlipidemia, thoracic aortic aneurysm and OSA, presenting with acute onset of right hemiplegia and dysphasia. He did not receive IV t-PA due to [place].  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 White matter infarct and small lobar micro hemorrhages were seen on a 2019 brain MRI.   Code Stroke CTA Head - No evidence of aneurysm, vascular malformation, or vasculitis. There are areas of CTA spot sign which increase changes of hematoma growth.   MRI head - Postoperative changes from interval left temporal craniotomy for hematoma evacuation. Previously seen large left temporal lobe intraparenchymal hemorrhage has largely been evacuated, with residual blood products seen within the resection cavity. Persistent localized edema and regional mass effect with trace 1-2 mm of left-to-right shift. Associated  intraventricular extension with small volume hemorrhage throughout the ventricular system.  Repeat CT Head 04/16/19 -improved air in the left frontal region but stable hemorrhage and mild surrounding edema with slight increase effacement of left lateral ventricle with 2 mm shift.    2D Echo - EF 60 - 65%. No cardiac source of emboli identified.   Echocardiogram performed by CCM at bedside 11/24 - LVH normal. RV mildly dilated w/ reduced fxn.  EEG, LT EEG no sz, L temporal lobe cortical dysfunction  [employer] Virus 2 - negative  LDL - 75  HgbA1c - 5.8  UDS - not ordered  VTE prophylaxis - SCDs  Xarelto (rivaroxaban) daily prior to admission, now on No antithrombotic given hemorrhage   Therapy recommendations:  pending  Disposition:  Pending  Dr. Leonie Man spoke with wife who wants everthing done. Consider palliative care involvement given long-term disabilities present.   Acute Hypoxic Respiratory  Failure w/ refractory hypoxia Volume Overload / Atelectasis / Aspiration in setting of obesity  Intubated  sedated  CCM onboard  For trach prior to extubation  Surgery consulted for placement - plan to place next week in the OR  On empiric abx   CCM managing weaning/ventilator strategies  Cerebral edema and regional mass effect   Mannitol - 25 g x 1 - 04/18/2019  Temporal Craniotomy - Dr Christella Noa - 03/27/2019  Seizure prophylaxis -> Keppra  Repeat CT Head 04/16/19 -improved air in the left frontal region but stable hemorrhage and mild surrounding edema with slight increase effacement of left lateral ventricle with 2 mm shift.    Consider hypertonic therapy if edema worsens  Atrial Fibrillation w/ RVR  Home anticoagulation:  Xarelto (rivaroxaban) daily   No AC given ICH  Hypertensive Emergency  BP on admission 194/94  Home BP meds: Hydralazine ; Cozaar ; Metoprolol  Treated with Cleviprex, now off    Current BP meds: metoprolol 50 bid  Stable  . SBP goal <  160 mmHg. . Prn labetalol   Hyperlipidemia  Home Lipid lowering medication: Lipitor 80 mg daily  LDL 75, goal < 70  Current lipid lowering medication: hold statin given ICH  Continue statin at discharge  Dysphagia . Secondary to stroke . NPO . On tube feedings . cortrak placed 11/24 . CBG, SSI . Speech on board . Likely will need PEG   Other Stroke Risk Factors  Former cigarette smoker - quit  Previous ETOH use  Morbid Obesity, Body mass index is 53.04 kg/m., recommend weight loss, diet and exercise as appropriate   Hx stroke/TIA  Obstructive sleep apnea, not on CPAP at home  H/O diastolic Congestive Heart Failure. On amiodarone.   Other Active Problems  Difficult foley placement -> Dr Junious Silk - leave foley in place  AKI on CKD - creatinine baseline 1.7 - now 2.85  Hypokalemia K 3.5  Fever 101.2   Leukocytosis, resolved WBC 10.5  Possible bacteremia with 1/2 BCx growing GPR. Possible contaminant. on ABX (see above).   Hospital day # 5 Patient remains critically ill due to respiratory failure from aspiration pneumonia and underlying CHF and COPD.  He is requiring sedation to help ventilate him.  Patient's wife is not available at the bedside.  Patient will hopefully get tracheostomy and PEG tube next week when he is medically stable.  Discussed with critical care MD.This patient is critically ill and at significant risk of neurological worsening, death and care requires constant monitoring of vital signs, hemodynamics,respiratory and cardiac monitoring, extensive review of multiple databases, frequent neurological assessment, discussion with family, other specialists and medical decision making of high complexity.I have made any additions or clarifications directly to the above note.This critical care time does not reflect procedure time, or teaching time or supervisory time of PA/NP/Med Resident etc but could involve care discussion time.  I spent 30 minutes of  neurocritical care time  in the care of  this patient.      Antony Contras, MD Medical Director [facility] Pager: [phone] 04/19/2019 8:52 AM   To contact Stroke Continuity provider, please refer to [email or web address] After hours, contact General Neurology

## 2019-04-19 NOTE — Progress Notes (Signed)
Patient moved from 4N25 to 4N26 in order to make 25 available for negative pressure requirements. Pt's wife was made aware.  Sandeep Radell, Rande Brunt, RN

## 2019-04-19 NOTE — Progress Notes (Signed)
Patient ID: JAH MCQUEARY, male   DOB: 1965/11/12, 53 y.o.   MRN: [id number] BP 112/81   Pulse 85   Temp 99.5 F (37.5 C)   Resp 16   Ht 5\' 11"  (1.803 m)   Wt (!) 172.5 kg   SpO2 98%   BMI 53.04 kg/m  Remains ventilated, sedated Some response to noxious stimuli Wound is clean and dry

## 2019-04-19 NOTE — Progress Notes (Signed)
Chaplain prayed over Jay Chen as requested by family to pray daily.    Chaplain will follow-up as needed.

## 2019-04-19 NOTE — Progress Notes (Signed)
NAME:  Jay Chen, MRN:  [id number], DOB:  [birth year], LOS: 5 ADMISSION DATE:  04/05/2019, CONSULTATION DATE:  04/16/2019  REFERRING MD:  Billy Fischer, CHIEF COMPLAINT:  ICH, respiratory failure  Brief History   53 yo male with PMH as below presented to the ED on 11/21 with right hemiparesis and aphasia.  Emergently to CT --> ICH --> to OR for craniotomy and hematoma evacuation.  PCCM consulted for vent management.  Past Medical History  Atrial fibrillation on xarelto, Obesity,OSA,HTN,HLD DM,CHF,CKD,Gout,Gallstones.Thoracic aortic aneurysm  Significant Hospital Events   11/21> ICH. Intubated. To OR emergently.   Consults:  PCCM NSGY  Procedures:  ETT 11/21>>> CVC 11/21>> Crani 11/21>>  Significant Diagnostic Tests:  11/21 CT Head> L cerebral ICH 5.7 x 4.1 x 4.2 cm. Local mass effect. Mild edema.  11/21 Echo> EF 55-60%, increased LVH, Grade II diastolic dysfunction, no valvular abnormalities 11/22 MRI Head> ICH largely evacuated, some residual blood, persistent edema and mass effect, 1-2 mm shift 11/22 CT Head> Hematoma unchanged from MRI, minimal subarachnoid hemorrhage 11/23 CT Head> Extent of hemorrhage unchanged, stable edema, 2 mm L to R shift  Micro Data:  04/13/2019 SARS CoV2> neg  Antimicrobials:  Ceftriaxone 11/25>>(day 2)  Interim history/subjective:  Intubated, sedated No overnight events Fever trend is down  Objective   Blood pressure 107/67, pulse 85, temperature 99.3 F (37.4 C), resp. rate 16, height 5\' 11"  (1.803 m), weight (!) 172.5 kg, SpO2 95 %.    Vent Mode: PRVC FiO2 (%):  [70 %] 70 % Set Rate:  [16 bmp] 16 bmp Vt Set:  [640 mL] 640 mL PEEP:  [14 cmH20-16 cmH20] 16 cmH20 Plateau Pressure:  [26 cmH20-29 cmH20] 29 cmH20   Intake/Output Summary (Last 24 hours) at 04/19/2019 0835 Last data filed at 04/19/2019 0800 Gross per 24 hour  Intake 1429.36 ml  Output 1260 ml  Net 169.36 ml   Filed Weights   04/17/19 0500 04/18/19 0500 04/19/19 0500   Weight: (!) 173.4 kg (!) 173.1 kg (!) 172.5 kg    Examination: Gen: Morbidly obese, sedated, appears comfortable. HENT: Left temporal incision CDI, ETT in place pupils 2 mm reacting, sluggish.   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 Leonie Man at bedside Prognosis is guarded to poor with multiple comorbidities  Best practice:  Diet: Tube feeds Pain/Anxiety/Delirium protocol (if indicated): Propofol VAP protocol (if indicated): In place DVT prophylaxis: SCD GI prophylaxis: protonix Glucose control: SSI Mobility: BR Code Status: Full  Family Communication: per primary Disposition: Neuro ICU   The patient is critically ill with multiple organ systems  failure and requires high complexity decision making for assessment and support, frequent evaluation and titration of therapies, application of advanced monitoring technologies and extensive  interpretation of multiple databases. Critical Care Time devoted to patient care services described in this note independent of APP/resident time (if applicable)  is 35 minutes.   Sherrilyn Rist MD [place] [facility] Personal pager: [phone] If unanswered, please page CCM On-call: [phone]

## 2019-04-20 LAB — GLUCOSE, CAPILLARY
Glucose-Capillary: 141 mg/dL — ABNORMAL HIGH (ref 70–99)
Glucose-Capillary: 120 mg/dL — ABNORMAL HIGH (ref 70–99)
Glucose-Capillary: 132 mg/dL — ABNORMAL HIGH (ref 70–99)
Glucose-Capillary: 120 mg/dL — ABNORMAL HIGH (ref 70–99)
Glucose-Capillary: 148 mg/dL — ABNORMAL HIGH (ref 70–99)
Glucose-Capillary: 143 mg/dL — ABNORMAL HIGH (ref 70–99)

## 2019-04-20 LAB — CBC WITH DIFFERENTIAL/PLATELET
Abs Immature Granulocytes: 0.1 10*3/uL — ABNORMAL HIGH (ref 0.00–0.07)
Basophils Absolute: 0.1 10*3/uL (ref 0.0–0.1)
Basophils Relative: 1 %
Eosinophils Absolute: 0.3 10*3/uL (ref 0.0–0.5)
Eosinophils Relative: 2 %
HCT: 32.2 % — ABNORMAL LOW (ref 39.0–52.0)
Hemoglobin: 10 g/dL — ABNORMAL LOW (ref 13.0–17.0)
Immature Granulocytes: 1 %
Lymphocytes Relative: 8 %
Lymphs Abs: 0.9 10*3/uL (ref 0.7–4.0)
MCH: 30.6 pg (ref 26.0–34.0)
MCHC: 31.1 g/dL (ref 30.0–36.0)
MCV: 98.5 fL (ref 80.0–100.0)
Monocytes Absolute: 1.7 10*3/uL — ABNORMAL HIGH (ref 0.1–1.0)
Monocytes Relative: 14 %
Neutro Abs: 9.1 10*3/uL — ABNORMAL HIGH (ref 1.7–7.7)
Neutrophils Relative %: 74 %
Platelets: 206 10*3/uL (ref 150–400)
RBC: 3.27 MIL/uL — ABNORMAL LOW (ref 4.22–5.81)
RDW: 14.2 % (ref 11.5–15.5)
WBC: 12.1 10*3/uL — ABNORMAL HIGH (ref 4.0–10.5)
nRBC: 0 % (ref 0.0–0.2)

## 2019-04-20 LAB — CULTURE, RESPIRATORY W GRAM STAIN: Culture: NORMAL

## 2019-04-20 LAB — BASIC METABOLIC PANEL
Anion gap: 14 (ref 5–15)
BUN: 124 mg/dL — ABNORMAL HIGH (ref 6–20)
CO2: 29 mmol/L (ref 22–32)
Calcium: 8.7 mg/dL — ABNORMAL LOW (ref 8.9–10.3)
Chloride: 108 mmol/L (ref 98–111)
Creatinine, Ser: 2.69 mg/dL — ABNORMAL HIGH (ref 0.61–1.24)
GFR calc Af Amer: 30 mL/min — ABNORMAL LOW (ref >60)
GFR calc non Af Amer: 25 mL/min — ABNORMAL LOW (ref >60)
Glucose, Bld: 121 mg/dL — ABNORMAL HIGH (ref 70–99)
Potassium: 3.6 mmol/L (ref 3.5–5.1)
Sodium: 151 mmol/L — ABNORMAL HIGH (ref 135–145)

## 2019-04-20 LAB — TRIGLYCERIDES: Triglycerides: 103 mg/dL (ref <150)

## 2019-04-20 NOTE — Progress Notes (Signed)
NAME:  Jay Chen, MRN:  [id number], DOB:  07-31-1965, LOS: 6 ADMISSION DATE:  03/27/2019, CONSULTATION DATE:  04/10/2019  REFERRING MD:  Billy Fischer, CHIEF COMPLAINT:  ICH, respiratory failure  Brief History   53 yo male with PMH as below presented to the ED on 11/21 with right hemiparesis and aphasia.  Emergently to CT --> ICH --> to OR for craniotomy and hematoma evacuation.  PCCM consulted for vent management.  Past Medical History  Atrial fibrillation on xarelto, Obesity,OSA,HTN,HLD DM,CHF,CKD,Gout,Gallstones.Thoracic aortic aneurysm  Significant Hospital Events   11/21> ICH. Intubated. To OR emergently.   Consults:  PCCM NSGY  Procedures:  ETT 11/21>>> CVC 11/21>> Crani 11/21>>  Significant Diagnostic Tests:  11/21 CT Head> L cerebral ICH 5.7 x 4.1 x 4.2 cm. Local mass effect. Mild edema.  11/21 Echo> EF 55-60%, increased LVH, Grade II diastolic dysfunction, no valvular abnormalities 11/22 MRI Head> ICH largely evacuated, some residual blood, persistent edema and mass effect, 1-2 mm shift 11/22 CT Head> Hematoma unchanged from MRI, minimal subarachnoid hemorrhage 11/23 CT Head> Extent of hemorrhage unchanged, stable edema, 2 mm L to R shift  Micro Data:  04/01/2019 SARS CoV2> neg  Antimicrobials:  Ceftriaxone 11/25>>(day 3)  Interim history/subjective:  No overnight events T-max of 100.8  Objective   Blood pressure 111/79, pulse 81, temperature 100 F (37.8 C), resp. rate 16, height 5\' 11"  (1.803 m), weight (!) 172.1 kg, SpO2 97 %.    Vent Mode: PRVC FiO2 (%):  [65 %-70 %] 65 % Set Rate:  [16 bmp] 16 bmp Vt Set:  [640 mL] 640 mL PEEP:  [14 cmH20-16 cmH20] 14 cmH20 Plateau Pressure:  [23 cmH20-30 cmH20] 23 cmH20   Intake/Output Summary (Last 24 hours) at 04/20/2019 [id number] Last data filed at 04/20/2019 0700 Gross per 24 hour  Intake 997.51 ml  Output 1775 ml  Net -777.49 ml   Filed Weights   04/18/19 0500 04/19/19 0500 04/20/19 0342  Weight: (!) 173.1  kg (!) 172.5 kg (!) 172.1 kg    Examination: Gen: Morbidly obese, sedated, comfortable HENT: ET tube in place, pupils 2 mm reacting, left temporal incision Pulm: Clear breath sounds anteriorly Card: S1-S2 appreciated, no murmur GI: Soft, bowel sounds appreciated GU: Output Neuro: Sedated Ext: Warm, dry, edema plus   Chest x-ray 11/26-reviewed by myself showing right lower lobe infiltrate with slight improvement compared to previous  Resolved Hospital Problem list     Assessment & Plan:   Acute respiratory failure History of obstructive sleep apnea -PEEP and FiO2 marginally better at 65% on 14 of PEEP -Wean as tolerated -VAP bundle in place  Fever -Tracheal secretions unyielding -Coagulase-negative staph in 1 blood culture bottle-likely contaminant -Continue ceftriaxone(day 3)  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 on 11/21 CT head stable -On Keppra for seizures -Most recent EEG shows no evidence of ongoing seizures -No anticoagulation  AKI on chronic kidney disease -BUN trending higher -Trend electrolytes -Avoid nephrotoxic's -Renal dose medications  Prognosis is guarded to poor with multiple comorbidities -Will require tracheostomy for long-term management   Best practice:  Diet: Continue tube feeds Pain/Anxiety/Delirium protocol (if indicated): Propofol VAP protocol (if indicated): In place DVT prophylaxis: SCD GI prophylaxis: Protonix Glucose control: SSI Mobility: BR Code Status: Full code Family Communication: per primary Disposition: Neuro ICU  The patient is critically ill with multiple organ systems failure and requires high complexity decision making for assessment and support, frequent evaluation and titration of therapies, application of advanced monitoring technologies and  extensive interpretation of multiple databases. Critical Care Time devoted to patient care services described in this note independent of APP/resident time (if  applicable)  is 31 minutes.   Sherrilyn Rist MD [place] [facility] Personal pager: [phone] If unanswered, please page CCM On-call: [phone]

## 2019-04-20 NOTE — Progress Notes (Addendum)
STROKE TEAM PROGRESS NOTE   INTERVAL HISTORY Patient remains quite sick with respiratory failure requiring high PEEP and FiO2 to maintain his saturations and he needs to be on sedation to help with his ventilatory status.  His PEEP today is down to 14 and FiO2 to 0.65.  Temperature is still present with T-max 100.8 neurological exam is limited due to sedation.  He has been started on antibiotics and chest x-ray shows right lower lobe infiltrates.  White count and temperature have come down with antibiotics  OBJECTIVE Vitals:   04/20/19 0300 04/20/19 0318 04/20/19 0342 04/20/19 0400  BP: (!) 116/92 (!) 116/92  111/76  Pulse: 98 91  90  Resp: _0 Temp: (!) 100.8 F (38.2 C)   (!) 100.8 F (38.2 C)  TempSrc:      SpO2: 94% 97%  94%  Weight:   (!) 172.1 kg   Height:        CBC:  Recent Labs  Lab 04/19/19 0411 04/20/19 0346  WBC 10.5 12.1*  NEUTROABS 7.7 9.1*  HGB 9.6* 10.0*  HCT 30.6* 32.2*  MCV 97.8 98.5  PLT 188 940   Basic Metabolic Panel:  Recent Labs  Lab 04/17/19 1709  04/18/19 0352 04/19/19 0336 04/19/19 0411  NA  --    < > 142 144 145  K  --    < > 3.5 3.5 3.5  CL  --   --  106  --  104  CO2  --   --  27  --  29  GLUCOSE  --   --  137*  --  113*  BUN  --   --  74*  --  98*  CREATININE  --   --  2.68*  --  2.85*  CALCIUM  --   --  8.3*  --  8.3*  MG 2.0  --  2.2  --   --   PHOS 5.9*  --  4.1  --   --    < > = values in this interval not displayed.   Lipid Panel:     Component Value Date/Time   CHOL 116 01/01/2018 0159   TRIG 103 04/20/2019 0346   HDL 24 (L) 01/01/2018 0159   CHOLHDL 4.8 01/01/2018 0159   VLDL 17 01/01/2018 0159   LDLCALC 75 01/01/2018 0159   HgbA1c:  Lab Results  Component Value Date   HGBA1C 5.8 (H) 04/17/2019   Urine Drug Screen: No results found for: LABOPIA, COCAINSCRNUR, LABBENZ, AMPHETMU, THCU, LABBARB  Alcohol Level No results found for: [place] Head Code Stroke Wo Contrast 03/28/2019 IMPRESSION:  49  cc hematoma in the left cerebral white matter as described. White matter infarct and small lobar micro hemorrhages were seen on a 2019 brain MRI.   Ct Code Stroke Cta Head W/wo Contrast 04/09/2019 IMPRESSION:  1. No evidence of aneurysm, vascular malformation, or vasculitis.  2. There are areas of CTA spot sign which increase changes of hematoma growth.   Mr Brain Wo Contrast 04/15/2019 IMPRESSION:  1. Postoperative changes from interval left temporal craniotomy for hematoma evacuation. Previously seen large left temporal lobe intraparenchymal hemorrhage has largely been evacuated, with residual blood products seen within the resection cavity. Persistent localized edema and regional mass effect with trace 1-2 mm of left-to-right shift.  2. Associated intraventricular extension with small volume hemorrhage throughout the ventricular system. No hydrocephalus or ventricular trapping.  3. Small postoperative extra-axial collection and pneumocephalus  overlying the left frontotemporal convexity without significant mass effect.   CT Head WO Contrast  04/15/2019 Unchanged left temporal hematoma and intraventricular clot when compared to prior MRI. Minimal subarachnoid hemorrhage is seen along the right sylvian fissure, likely redistribution. No midline shift and no ventricular obstruction.   Ct Head Wo Contrast 04/16/2019 Somewhat less air in the area of hemorrhage and in the left frontal regions compared to 1 day prior. Extent of hemorrhage is virtually identical with marginally less hemorrhage seen in the left lateral ventricle compared to 1 day prior. Stable mild surrounding edema in the left temporal region. There is currently slight increased effacement of the left lateral ventricle compared to 1 day prior, best seen on coronal images. There is 2 mm of midline shift to the right. No new brain parenchymal lesion evident. Foci of paranasal sinus disease and mastoid disease noted.   Dg Chest Port 1  View 04/19/2019 1. Mild stable cardiomegaly with findings suggesting mild interstitial edema. Hazy density right base with slight interval improvement likely part of the interstitial edema although infection is possible.  04/17/2019 1. Increased right basilar opacities. Unchanged small left pleural effusion. 2. Slight retraction of endotracheal tube. Otherwise unchanged support apparatus. 04/16/2019 1. Somewhat high positioning of the endotracheal tube, approximately 8 cm from the carina. Could be advanced 3 cm to the mid trachea. 2. Right IJ catheter in the mid SVC. 3. Satisfactory positioning of the transesophageal tube. 4. Unchanged dense retrocardiac opacity, likely a combination of pleural effusion and airspace disease. 5. Improving atelectasis and interstitial opacity elsewhere.  03/26/2019 1. New right IJ central venous catheter with the tip overlying the mid SVC.  2. No evidence of pneumothorax or other complication.  3. Overall improved aeration with decreased right basilar atelectasis and decreased interstitial edema versus vascular crowding.  4. Persistent dense left basilar airspace opacity.  04/07/2019 Endotracheal tube tip 3.7 cm above the carina. Low volume film with cardiomegaly, vascular congestion and interstitial pulmonary edema. Bibasilar collapse/consolidation, left greater than right.   Transthoracic Echocardiogram  04/09/2019  1. Left ventricular ejection fraction, by visual estimation, is 55 to 60%. The left ventricle has normal function. There is severely increased left ventricular hypertrophy.  2. Elevated left atrial pressure.  3. Left ventricular diastolic parameters are consistent with Grade II diastolic dysfunction (pseudonormalization).  4. The left ventricle has no regional wall motion abnormalities.  5. Global right ventricle has normal systolic function.The right ventricular size is normal. No increase in right ventricular wall thickness.  6. Left atrial size was  mildly dilated.  7. Right atrial size was normal.  8. Small pericardial effusion.  9. The pericardial effusion is circumferential. 10. The mitral valve is normal in structure. No evidence of mitral valve regurgitation. 11. The tricuspid valve is normal in structure. Tricuspid valve regurgitation is not demonstrated. 12. The aortic valve is normal in structure. Aortic valve regurgitation is trivial. 13. The pulmonic valve was normal in structure. Pulmonic valve regurgitation is not visualized. 14. Aortic dilatation noted. 15. There is mild dilatation of the aortic root measuring 42 mm. 16. TR signal is inadequate for assessing pulmonary artery systolic pressure. 17. The inferior vena cava is dilated in size with <50% respiratory variability, suggesting right atrial pressure of 15 mmHg.  Transthoracic Echocardiogram  04/17/2019 By CCM. LVH with normal LV size and function. RV mildly dilated with mildly reduced function. No septal dyssynchrony. No significant valvular lesions. Indeterminate diastolic function due AF, filling pressures appear normal. Unable to calculate  RVSP but PAP likely normal by PVAT. PA seen to bifurcation with no dilatation. IVC not visualized. TDS.  ECG - SR rate 61 BPM. (See cardiology reading for complete details)  EEG 04/16/2019 This study is suggestive of cortical dysfunction in left frontotemporal region likely secondary to underlying ICH.  Additionally there is evidence of severe diffuse encephalopathy, nonspecific etiology but could be secondary to sedated state.  No seizures or epileptiform discharges were seen throughout the recording.  LT EEG 04/17/2019 This study issuggestive of cortical dysfunction in left frontotemporal region likely secondary to underlying ICH.Additionally there is evidence of severe diffuse encephalopathy, nonspecific etiology but could be secondary to sedated state. No seizures or epileptiform discharges were seen throughout the  recording.   PHYSICAL EXAM      Obese middle-aged Caucasian male who is sedated and intubated . [name] Afebrile. Head is nontraumatic. Neck is supple without bruit.    Cardiac exam no murmur or gallop. Lungs are clear to auscultation. Distal pulses are well felt. Neurological Exam :  Sedated and intubated.  Eyes are closed.  Barely responsive to sternal rub with partial opening of the eyes.  Semipurposeful flexion movements in the left upper and lower extremity and trace in the right lower extremity.  No response in the right upper extremity with occasional extensor posturing.  Deep tendon reflexes are depressed.  Plantars are both equivocal.   ASSESSMENT/PLAN [name] is a 53 y.o. male with history of small left temporal lobe stroke in 2019, old microhemorrhages bilaterally on prior brain MRI, morbid obesity, a-fib with RVR on Xarelto, CKD, systolic heart failure with prior EF of 20 to 25% and most recent echo with EF of 50-55% in June, hypertension, diabetes, hyperlipidemia, thoracic aortic aneurysm and OSA, presenting with acute onset of right hemiplegia and dysphasia. He did not receive IV t-PA due to [place].  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 White matter infarct and small lobar micro hemorrhages were seen on a 2019 brain MRI.   Code Stroke CTA Head - No evidence of aneurysm, vascular malformation, or vasculitis. There are areas of CTA spot sign which increase changes of hematoma growth.   MRI head - Postoperative changes from interval left temporal craniotomy for hematoma evacuation. Previously seen large left temporal lobe intraparenchymal hemorrhage has largely been evacuated, with residual blood products seen within the resection cavity. Persistent localized edema and regional mass effect with trace 1-2  mm of left-to-right shift. Associated intraventricular extension with small volume hemorrhage throughout the ventricular system.  Repeat CT Head 04/16/19 -improved air in the left frontal region but stable hemorrhage and mild surrounding edema with slight increase effacement of left lateral ventricle with 2 mm shift.    2D Echo - EF 60 - 65%. No cardiac source of emboli identified.   Echocardiogram performed by CCM at bedside 11/24 - LVH normal. RV mildly dilated w/ reduced fxn.  EEG, LT EEG no sz, L temporal lobe cortical dysfunction  [employer] Virus 2 - negative  LDL - 75  HgbA1c - 5.8  UDS - not ordered  VTE prophylaxis - SCDs  Xarelto (rivaroxaban) daily prior to admission, now on No antithrombotic given hemorrhage   Therapy recommendations:  Pending - not medically ready for therapy  Disposition:  Pending  Dr. Leonie Man spoke with wife who wants everthing done. Consider  palliative care involvement given long-term disabilities present.   Acute Hypoxic Respiratory Failure w/ refractory hypoxia Volume Overload / Atelectasis / Aspiration in setting of obesity  Intubated  sedated  CCM onboard  For trach prior to extubation  Surgery consulted for trach placement - plan to place next week in the OR  On empiric abx   CCM managing weaning/ventilator strategies  Fever / Leukocytosis  Temp - 100.8  Leukocytosis - 10.5->12.1  CXR - 11/26 - Mild stable cardiomegaly with findings suggesting mild interstitial edema. Hazy density right base with slight interval improvement likely part of the interstitial edema although infection is possible.  Rocephin - started 11/25 for pneumonia  Blood cultures - 11/23 - Staph Coag Neg - (left hand) - possible contaminant   Respiratory cultures - 11/25 - rare Gram Positive Cocci in pairs - final pending  Cerebral edema and regional mass effect   Mannitol - 25 g x 1 - 04/18/2019  Temporal Craniotomy - Dr Christella Noa - 04/23/2019  Seizure  prophylaxis -> Keppra  Repeat CT Head 04/16/19 - improved air in the left frontal region but stable hemorrhage and mild surrounding edema with slight increase effacement of left lateral ventricle with 2 mm shift.    Hypertonic therapy if edema worsens - not felt to be indicated at this time  Atrial Fibrillation w/ RVR  Home anticoagulation:  Xarelto (rivaroxaban) daily   No AC given ICH  Hypertensive Emergency  BP on admission 194/94  Home BP meds: Hydralazine ; Cozaar ; Metoprolol  Treated with Cleviprex, now off    Current BP meds: metoprolol 50 bid  Stable  . SBP goal < 160 mmHg. . Prn labetalol   Hyperlipidemia  Home Lipid lowering medication: Lipitor 80 mg daily  LDL 75, goal < 70  Current lipid lowering medication: hold statin given ICH  Continue statin at discharge  Dysphagia . Secondary to stroke . NPO . On tube feedings . cortrak placed 11/24 . CBG, SSI . Speech on board . Likely will need PEG next week if medically stable   Other Stroke Risk Factors  Former cigarette smoker - quit  Previous ETOH use  Morbid Obesity, Body mass index is 52.92 kg/m., recommend weight loss, diet and exercise as appropriate   Hx stroke/TIA  Obstructive sleep apnea, not on CPAP at home  H/O diastolic Congestive Heart Failure. On amiodarone.   Other Active Problems  Difficult foley placement -> Dr Junious Silk - leave foley in place  AKI on CKD - creatinine baseline 1.7 - now 2.85 (recheck in AM)  Hypokalemia K - 3.5 (recheck in AM)  Anemia - Hb - 10.0 (recheck in AM)  CHF history - CXR - 11/26 - Mild stable cardiomegaly with findings suggesting mild interstitial edema. (BNP in AM)    Hospital day # 6 Patient remains critically ill due to respiratory failure from aspiration pneumonia and underlying CHF and COPD.  He is requiring sedation to help ventilate him.  Patient's wife is not available at the bedside.  Patient will hopefully get tracheostomy and PEG  tube next week when he is medically stable.  Plan to discuss with wife today goals of care and see if she is agreeable to a palliative care approach instead.e.  Discussed with critical care MD.This patient is critically ill and at significant risk of neurological worsening, death and care requires constant monitoring of vital signs, hemodynamics,respiratory and cardiac monitoring, extensive review of multiple databases, frequent neurological assessment, discussion with family, other specialists and medical  decision making of high complexity.I have made any additions or clarifications directly to the above note.This critical care time does not reflect procedure time, or teaching time or supervisory time of PA/NP/Med Resident etc but could involve care discussion time.  I spent 30 minutes of neurocritical care time  in the care of  this patient.   Antony Contras, MD Medical Director [place] [facility] Pager: [phone] 04/20/2019 11:23 AM ADDENDUM : Met with wife and had conversation about prognosis and goals of care. She is willing to meet with palliative and consult placed in epic.Continue agrressive care  For now  Antony Contras, MD  To contact Stroke Continuity provider, please refer to [email or web address] After hours, contact General Neurology

## 2019-04-21 ENCOUNTER — Inpatient Hospital Stay (HOSPITAL_COMMUNITY): Payer: BC Managed Care – PPO

## 2019-04-21 DIAGNOSIS — I161 Hypertensive emergency: Secondary | ICD-10-CM

## 2019-04-21 DIAGNOSIS — I10 Essential (primary) hypertension: Secondary | ICD-10-CM

## 2019-04-21 DIAGNOSIS — Z9989 Dependence on other enabling machines and devices: Secondary | ICD-10-CM

## 2019-04-21 DIAGNOSIS — G4733 Obstructive sleep apnea (adult) (pediatric): Secondary | ICD-10-CM

## 2019-04-21 DIAGNOSIS — I4891 Unspecified atrial fibrillation: Secondary | ICD-10-CM

## 2019-04-21 DIAGNOSIS — R1312 Dysphagia, oropharyngeal phase: Secondary | ICD-10-CM

## 2019-04-21 LAB — CULTURE, BLOOD (ROUTINE X 2): Culture: NO GROWTH

## 2019-04-21 LAB — CBC WITH DIFFERENTIAL/PLATELET
Abs Immature Granulocytes: 0.2 10*3/uL — ABNORMAL HIGH (ref 0.00–0.07)
Basophils Absolute: 0.1 10*3/uL (ref 0.0–0.1)
Basophils Relative: 0 %
Eosinophils Absolute: 0.3 10*3/uL (ref 0.0–0.5)
Eosinophils Relative: 2 %
HCT: 32.2 % — ABNORMAL LOW (ref 39.0–52.0)
Hemoglobin: 9.8 g/dL — ABNORMAL LOW (ref 13.0–17.0)
Immature Granulocytes: 1 %
Lymphocytes Relative: 7 %
Lymphs Abs: 1.2 10*3/uL (ref 0.7–4.0)
MCH: 30.6 pg (ref 26.0–34.0)
MCHC: 30.4 g/dL (ref 30.0–36.0)
MCV: 100.6 fL — ABNORMAL HIGH (ref 80.0–100.0)
Monocytes Absolute: 2 10*3/uL — ABNORMAL HIGH (ref 0.1–1.0)
Monocytes Relative: 12 %
Neutro Abs: 13.5 10*3/uL — ABNORMAL HIGH (ref 1.7–7.7)
Neutrophils Relative %: 78 %
Platelets: 243 10*3/uL (ref 150–400)
RBC: 3.2 MIL/uL — ABNORMAL LOW (ref 4.22–5.81)
RDW: 14.3 % (ref 11.5–15.5)
WBC: 17.2 10*3/uL — ABNORMAL HIGH (ref 4.0–10.5)
nRBC: 0 % (ref 0.0–0.2)

## 2019-04-21 LAB — GLUCOSE, CAPILLARY
Glucose-Capillary: 114 mg/dL — ABNORMAL HIGH (ref 70–99)
Glucose-Capillary: 119 mg/dL — ABNORMAL HIGH (ref 70–99)
Glucose-Capillary: 123 mg/dL — ABNORMAL HIGH (ref 70–99)
Glucose-Capillary: 128 mg/dL — ABNORMAL HIGH (ref 70–99)
Glucose-Capillary: 134 mg/dL — ABNORMAL HIGH (ref 70–99)
Glucose-Capillary: 155 mg/dL — ABNORMAL HIGH (ref 70–99)

## 2019-04-21 LAB — BASIC METABOLIC PANEL
Anion gap: 11 (ref 5–15)
BUN: 134 mg/dL — ABNORMAL HIGH (ref 6–20)
CO2: 30 mmol/L (ref 22–32)
Calcium: 8.6 mg/dL — ABNORMAL LOW (ref 8.9–10.3)
Chloride: 109 mmol/L (ref 98–111)
Creatinine, Ser: 2.61 mg/dL — ABNORMAL HIGH (ref 0.61–1.24)
GFR calc Af Amer: 31 mL/min — ABNORMAL LOW (ref >60)
GFR calc non Af Amer: 26 mL/min — ABNORMAL LOW (ref >60)
Glucose, Bld: 130 mg/dL — ABNORMAL HIGH (ref 70–99)
Potassium: 3.6 mmol/L (ref 3.5–5.1)
Sodium: 150 mmol/L — ABNORMAL HIGH (ref 135–145)

## 2019-04-21 LAB — BRAIN NATRIURETIC PEPTIDE: B Natriuretic Peptide: 114.7 pg/mL — ABNORMAL HIGH (ref 0.0–100.0)

## 2019-04-21 LAB — TRIGLYCERIDES: Triglycerides: 125 mg/dL (ref <150)

## 2019-04-21 LAB — MAGNESIUM: Magnesium: 2.7 mg/dL — ABNORMAL HIGH (ref 1.7–2.4)

## 2019-04-21 LAB — PHOSPHORUS: Phosphorus: 3.8 mg/dL (ref 2.5–4.6)

## 2019-04-21 MED ORDER — HEPARIN SODIUM (PORCINE) 5000 UNIT/ML IJ SOLN
5000.0000 [IU] | Freq: Three times a day (TID) | INTRAMUSCULAR | Status: DC
  Administered 2019-04-21 – 2019-04-24 (×9): 5000 [IU] via SUBCUTANEOUS
  Filled 2019-04-21 (×8): qty 1

## 2019-04-21 MED ORDER — LACTATED RINGERS IV SOLN
INTRAVENOUS | Status: DC
  Administered 2019-04-21: 10:00:00 via INTRAVENOUS

## 2019-04-21 MED ORDER — LACTATED RINGERS IV BOLUS
500.0000 mL | Freq: Once | INTRAVENOUS | Status: AC
  Administered 2019-04-21: 500 mL via INTRAVENOUS

## 2019-04-21 MED ORDER — FREE WATER
200.0000 mL | Freq: Three times a day (TID) | Status: DC
  Administered 2019-04-21 – 2019-04-22 (×4): 200 mL

## 2019-04-21 NOTE — Progress Notes (Signed)
SATS dropped in to the low 80's. RT did recruitment maneuver for 2 minutes, pt tolerated well and sats up to 100% RT to continue to monitor as needed.

## 2019-04-21 NOTE — Progress Notes (Signed)
STROKE TEAM PROGRESS NOTE   INTERVAL HISTORY Pt RN at bedside. Pt still intubated on vent. High PEEP. On propofol and fentanyl, not open eyes on voice. Not moving all extremities. Still has afib RVR. On TF. Palliative care consult ordered yesterday by Dr. Leonie Man.   OBJECTIVE Vitals:   04/21/19 0100 04/21/19 0200 04/21/19 0330 04/21/19 0331  BP: 109/68 115/73 109/73   Pulse: 87 73 95   Resp: 16 16 16    Temp: 99.9 F (37.7 C) 100 F (37.8 C)    TempSrc:      SpO2: 96% 100% 98% 98%  Weight:      Height:        CBC:  Recent Labs  Lab 04/20/19 0346 04/21/19 0422  WBC 12.1* 17.2*  NEUTROABS 9.1* 13.5*  HGB 10.0* 9.8*  HCT 32.2* 32.2*  MCV 98.5 100.6*  PLT 206 0000000   Basic Metabolic Panel:  Recent Labs  Lab 04/17/19 1709  04/18/19 0352  04/20/19 0832 04/21/19 0422  NA  --    < > 142   < > 151* 150*  K  --    < > 3.5   < > 3.6 3.6  CL  --   --  106   < > 108 109  CO2  --   --  27   < > 29 30  GLUCOSE  --   --  137*   < > 121* 130*  BUN  --   --  74*   < > 124* 134*  CREATININE  --   --  2.68*   < > 2.69* 2.61*  CALCIUM  --   --  8.3*   < > 8.7* 8.6*  MG 2.0  --  2.2  --   --   --   PHOS 5.9*  --  4.1  --   --   --    < > = values in this interval not displayed.   Lipid Panel:     Component Value Date/Time   CHOL 116 01/01/2018 0159   TRIG 103 04/20/2019 0346   HDL 24 (L) 01/01/2018 0159   CHOLHDL 4.8 01/01/2018 0159   VLDL 17 01/01/2018 0159   LDLCALC 75 01/01/2018 0159   HgbA1c:  Lab Results  Component Value Date   HGBA1C 5.8 (H) 04/06/2019   Urine Drug Screen: No results found for: LABOPIA, COCAINSCRNUR, LABBENZ, AMPHETMU, THCU, LABBARB  Alcohol Level No results found for: [place] Head Code Stroke Wo Contrast 03/29/2019 IMPRESSION:  49 cc hematoma in the left cerebral white matter as described. White matter infarct and small lobar micro hemorrhages were seen on a 2019 brain MRI.   Ct Code Stroke Cta Head W/wo  Contrast 04/05/2019 IMPRESSION:  1. No evidence of aneurysm, vascular malformation, or vasculitis.  2. There are areas of CTA spot sign which increase changes of hematoma growth.   Mr Brain Wo Contrast 04/15/2019 IMPRESSION:  1. Postoperative changes from interval left temporal craniotomy for hematoma evacuation. Previously seen large left temporal lobe intraparenchymal hemorrhage has largely been evacuated, with residual blood products seen within the resection cavity. Persistent localized edema and regional mass effect with trace 1-2 mm of left-to-right shift.  2. Associated intraventricular extension with small volume hemorrhage throughout the ventricular system. No hydrocephalus or ventricular trapping.  3. Small postoperative extra-axial collection and pneumocephalus overlying the left frontotemporal convexity without significant mass effect.   CT Head WO Contrast  04/15/2019 Unchanged left temporal hematoma  and intraventricular clot when compared to prior MRI. Minimal subarachnoid hemorrhage is seen along the right sylvian fissure, likely redistribution. No midline shift and no ventricular obstruction.   Ct Head Wo Contrast 04/16/2019 Somewhat less air in the area of hemorrhage and in the left frontal regions compared to 1 day prior. Extent of hemorrhage is virtually identical with marginally less hemorrhage seen in the left lateral ventricle compared to 1 day prior. Stable mild surrounding edema in the left temporal region. There is currently slight increased effacement of the left lateral ventricle compared to 1 day prior, best seen on coronal images. There is 2 mm of midline shift to the right. No new brain parenchymal lesion evident. Foci of paranasal sinus disease and mastoid disease noted.   Dg Chest Port 1 View 04/19/2019 1. Mild stable cardiomegaly with findings suggesting mild interstitial edema. Hazy density right base with slight interval improvement likely part of the  interstitial edema although infection is possible.  04/17/2019 1. Increased right basilar opacities. Unchanged small left pleural effusion. 2. Slight retraction of endotracheal tube. Otherwise unchanged support apparatus. 04/16/2019 1. Somewhat high positioning of the endotracheal tube, approximately 8 cm from the carina. Could be advanced 3 cm to the mid trachea. 2. Right IJ catheter in the mid SVC. 3. Satisfactory positioning of the transesophageal tube. 4. Unchanged dense retrocardiac opacity, likely a combination of pleural effusion and airspace disease. 5. Improving atelectasis and interstitial opacity elsewhere.  04/23/2019 1. New right IJ central venous catheter with the tip overlying the mid SVC.  2. No evidence of pneumothorax or other complication.  3. Overall improved aeration with decreased right basilar atelectasis and decreased interstitial edema versus vascular crowding.  4. Persistent dense left basilar airspace opacity.  03/27/2019 Endotracheal tube tip 3.7 cm above the carina. Low volume film with cardiomegaly, vascular congestion and interstitial pulmonary edema. Bibasilar collapse/consolidation, left greater than right.   Transthoracic Echocardiogram  04/12/2019  1. Left ventricular ejection fraction, by visual estimation, is 55 to 60%. The left ventricle has normal function. There is severely increased left ventricular hypertrophy.  2. Elevated left atrial pressure.  3. Left ventricular diastolic parameters are consistent with Grade II diastolic dysfunction (pseudonormalization).  4. The left ventricle has no regional wall motion abnormalities.  5. Global right ventricle has normal systolic function.The right ventricular size is normal. No increase in right ventricular wall thickness.  6. Left atrial size was mildly dilated.  7. Right atrial size was normal.  8. Small pericardial effusion.  9. The pericardial effusion is circumferential. 10. The mitral valve is normal in  structure. No evidence of mitral valve regurgitation. 11. The tricuspid valve is normal in structure. Tricuspid valve regurgitation is not demonstrated. 12. The aortic valve is normal in structure. Aortic valve regurgitation is trivial. 13. The pulmonic valve was normal in structure. Pulmonic valve regurgitation is not visualized. 14. Aortic dilatation noted. 15. There is mild dilatation of the aortic root measuring 42 mm. 16. TR signal is inadequate for assessing pulmonary artery systolic pressure. 17. The inferior vena cava is dilated in size with <50% respiratory variability, suggesting right atrial pressure of 15 mmHg.  Transthoracic Echocardiogram  04/17/2019 By CCM. LVH with normal LV size and function. RV mildly dilated with mildly reduced function. No septal dyssynchrony. No significant valvular lesions. Indeterminate diastolic function due AF, filling pressures appear normal. Unable to calculate RVSP but PAP likely normal by PVAT. PA seen to bifurcation with no dilatation. IVC not visualized. TDS.  ECG -  SR rate 61 BPM. (See cardiology reading for complete details)  EEG 04/16/2019 This study is suggestive of cortical dysfunction in left frontotemporal region likely secondary to underlying ICH.  Additionally there is evidence of severe diffuse encephalopathy, nonspecific etiology but could be secondary to sedated state.  No seizures or epileptiform discharges were seen throughout the recording.  LT EEG 04/17/2019 This study issuggestive of cortical dysfunction in left frontotemporal region likely secondary to underlying ICH.Additionally there is evidence of severe diffuse encephalopathy, nonspecific etiology but could be secondary to sedated state. No seizures or epileptiform discharges were seen throughout the recording.   PHYSICAL EXAM      Temp:  [98.6 F (37 C)-100.8 F (38.2 C)] 100.8 F (38.2 C) (11/28 0900) Pulse Rate:  [40-100] 100 (11/28 0900) Resp:  [16-20] 16  (11/28 0900) BP: (93-145)/(68-101) 121/80 (11/28 0900) SpO2:  [90 %-100 %] 97 % (11/28 0900) FiO2 (%):  [60 %-80 %] 80 % (11/28 0755)  General - Well nourished, well developed, intubated on sedation.  Ophthalmologic - fundi not visualized due to noncooperation.  Cardiovascular - irregularly irregular heart rate and rhythm, afib with RVR.  Neuro - intubated on sedation, eyes closed, not following commands. With forced eye opening, eyes in mid position, not blinking to visual threat, doll's eyes absent, not tracking, bilateral pupil small 1.54mm, sluggish to light. Corneal reflex present L>R, gag and cough absent. Breathing over the vent.  Facial symmetry not able to test due to ET tube.  Tongue protrusion not cooperative. On pain stimulation, slight withdraw on RLE only. DTR 1+ and left babinski. Sensation, coordination and gait not tested.    ASSESSMENT/PLAN Mr. BRAYTON HUSBANDS is a 53 y.o. male with history of small left temporal lobe stroke in 2019, old microhemorrhages bilaterally on prior brain MRI, morbid obesity, a-fib with RVR on Xarelto, CKD, systolic heart failure with prior EF of 20 to 25% and most recent echo with EF of 50-55% in June, hypertension, diabetes, hyperlipidemia, thoracic aortic aneurysm and OSA, presenting with acute onset of right hemiplegia and dysphasia. He did not receive IV t-PA due to [place].  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   CTA Head and neck - No evidence of aneurysm, AVM, or vasculitis. There are areas of CTA spot sign which increase changes of hematoma growth.   MRI head - Postoperative changes from interval left temporal craniotomy for hematoma evacuation. trace 1-2 mm of left-to-right shift. Associated intraventricular extension with small volume hemorrhage throughout the ventricular system.  Repeat  CT - improved air in the left frontal region but stable hemorrhage and mild surrounding edema  2D Echo - EF 60 - 65%.  EEG, LT EEG no sz, L temporal lobe cortical dysfunction  [employer] Virus 2 - negative  LDL - 75  HgbA1c - 5.8  VTE prophylaxis - heparin subq  Xarelto (rivaroxaban) daily prior to admission, now on No antithrombotic given hemorrhage   Therapy recommendations:  Pending  Disposition:  Pending, Dr. Leonie Man spoke with wife who wants everthing done. Palliative care consult pending.  Acute Hypoxic Respiratory Failure w/ refractory hypoxia Volume Overload / Atelectasis / Aspiration in setting of obesity  Intubated and sedated  CCM onboard  Surgery consulted for trach placement - plan to place next week in the OR if aggressive care is pursued.   On empiric rocephin  CCM managing weaning/ventilator strategies  Fever / Leukocytosis  Temp - 100.8->100  Leukocytosis - 10.5->12.1->17.2  CXR - 11/26 - Mild stable cardiomegaly with findings suggesting mild interstitial edema. Hazy density right base with slight interval improvement likely part of the interstitial edema although infection is possible.  Rocephin started 11/25 for pneumonia  Blood cultures - 11/23 - Staph Coag Neg - left hand - possible contaminant - left forearm no growth x 4 days  Respiratory cultures - 11/25 - rare Gram Positive Cocci in pairs - final - CCM onboard  Cerebral edema and regional mass effect   Mannitol - 25 g x 1 - 03/28/2019  Temporal Craniotomy - Dr Christella Noa - 04/09/2019  Seizure prophylaxis -> Keppra  Repeat CT Head 04/16/19 - improved air in the left frontal region but stable hemorrhage and mild surrounding edema with slight increase effacement of left lateral ventricle with 2 mm shift.    Hypertonic therapy if edema worsens - not felt to be indicated at this time  Atrial Fibrillation w/ RVR  Home anticoagulation:  Xarelto (rivaroxaban) daily   No AC given ICH  Rate  controlled  On amiodarone and metoprolol 50  Hypertensive Emergency  BP on admission 194/94  Home BP meds: Hydralazine ; Cozaar ; Metoprolol  Treated with Cleviprex, now off    Current BP meds: metoprolol 50 bid  Stable  . SBP goal < 160 mmHg . Prn labetalol   Hyperlipidemia  Home Lipid lowering medication: Lipitor 80 mg daily  LDL 75, goal < 70  Current lipid lowering medication: hold statin given ICH  Continue statin at discharge  Dysphagia . Secondary to stroke . NPO . cortrak placed 11/24, on tube feedings . Speech on board . Likely will need PEG next week if medically stable and aggressive care is planned   Other Stroke Risk Factors  Former cigarette smoker - quit  Previous ETOH use  Morbid Obesity, Body mass index is 52.92 kg/m., recommend weight loss, diet and exercise as appropriate   Hx stroke/TIA 2019  Obstructive sleep apnea, not on CPAP at home  H/O diastolic Congestive Heart Failure.    Other Active Problems  Difficult foley placement -> Dr Junious Silk - leave foley in place  AKI on CKD - creatinine baseline 1.7 - now 2.85->2.69->2.61  Hypokalemia K - 3.5->3.6->3.6  Anemia - Hb - 10.0->9.8   Hypernatremia - 142->151->150   Hospital day # 7   Patient remains critically ill due to respiratory failure from aspiration pneumonia and underlying CHF and COPD.  He is requiring sedation to help ventilate him. This patient is critically ill and at significant risk of neurological worsening, death and care requires constant monitoring of vital signs, hemodynamics,respiratory and cardiac monitoring, extensive review of multiple databases, frequent neurological assessment, discussion with family, other specialists and medical decision making of high complexity. I spent 35 minutes of neurocritical care time  in the care of  this patient.  Rosalin Hawking, MD PhD Stroke Neurology 04/21/2019 5:03 PM   To contact Stroke Continuity provider, please refer  to [email or web address] After hours, contact General Neurology

## 2019-04-21 NOTE — Progress Notes (Signed)
NAME:  Jay Chen, MRN:  [id number], DOB:  22-Jan-1966, LOS: 7 ADMISSION DATE:  04/09/2019, CONSULTATION DATE:  04/13/2019  REFERRING MD:  Billy Fischer, CHIEF COMPLAINT:  ICH, respiratory failure  Brief History   53 yo male with PMH as below presented to the ED on 11/21 with right hemiparesis and aphasia.  Emergently to CT --> ICH --> to OR for craniotomy and hematoma evacuation.  PCCM consulted for vent management.  Past Medical History  Atrial fibrillation on xarelto, Obesity,OSA,HTN,HLD DM,CHF,CKD,Gout,Gallstones.Thoracic aortic aneurysm  Significant Hospital Events   11/21> ICH. Intubated. To OR emergently.   Consults:  PCCM NSGY  Procedures:  ETT 11/21>>> CVC 11/21>> Crani 11/21>>  Significant Diagnostic Tests:  11/21 CT Head> L cerebral ICH 5.7 x 4.1 x 4.2 cm. Local mass effect. Mild edema.  11/21 Echo> EF 55-60%, increased LVH, Grade II diastolic dysfunction, no valvular abnormalities 11/22 MRI Head> ICH largely evacuated, some residual blood, persistent edema and mass effect, 1-2 mm shift 11/22 CT Head> Hematoma unchanged from MRI, minimal subarachnoid hemorrhage 11/23 CT Head> Extent of hemorrhage unchanged, stable edema, 2 mm L to R shift  Micro Data:  04/06/2019 SARS CoV2> neg  Antimicrobials:  Ceftriaxone 11/25>>(day 4)  Interim history/subjective:  No overnight events T-max of 100.2 Increasing PEEP requirement and increased FiO2 need  Objective   Blood pressure (!) 145/84, pulse 91, temperature 100.2 F (37.9 C), temperature source Bladder, resp. rate 17, height 5\' 11"  (1.803 m), weight (!) 172.1 kg, SpO2 97 %.    Vent Mode: PRVC FiO2 (%):  [60 %-80 %] 80 % Set Rate:  [16 bmp-18 bmp] 16 bmp Vt Set:  [640 mL] 640 mL PEEP:  [14 cmH20-18 cmH20] 18 cmH20 Plateau Pressure:  [27 cmH20-33 cmH20] 29 cmH20   Intake/Output Summary (Last 24 hours) at 04/21/2019 [id number] Last data filed at 04/21/2019 0800 Gross per 24 hour  Intake 1088.93 ml  Output 800 ml  Net  288.93 ml   Filed Weights   04/18/19 0500 04/19/19 0500 04/20/19 0342  Weight: (!) 173.1 kg (!) 172.5 kg (!) 172.1 kg    Examination: Gen: Morbidly obese, comfortable HENT: Endotracheal tube in place, left temporal incision  Pulm: Clear breath sounds anteriorly Card: S1-S2 appreciated, no murmur GI: Soft, bowel sounds appreciated GU: Output Neuro: Sedated Ext: Warm, dry, edema plus   Chest x-ray 11/26-reviewed showing basilar infiltrates, worse at right base  Resolved Hospital Problem list     Assessment & Plan:   Acute respiratory failure History of obstructive sleep apnea -PEEP requirements increased, FiO2 increased -Not able to wean-present -VAP bundle in place  Possible pneumonia -Continue Rocephin -repeat CXR today  Fever -Tracheal secretions unyielding -Day 4 of ceftriaxone  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 on 11/21 CT head stable -On Keppra for seizures -Most recent EEG shows no evidence of ongoing seizures -No anticoagulation  AKI on chronic kidney disease -BUN trending higher -Avoid nephrotoxic's -Renal dose medications -No acute need for renal replacement therapy -LR 500 bolus, then 29ml/hr  Prognosis remains guarded to poor -We will require tracheostomy for long-term management -Not a stable candidate for tracheostomy at present with increasing FiO2 and increasing PEEP   Hypernatremia -Start free water -D5 water  Best practice:  Diet: Continue tube feed Pain/Anxiety/Delirium protocol (if indicated): Propofol VAP protocol (if indicated): In place DVT prophylaxis: SCD GI prophylaxis: Protonix Glucose control: SSI Mobility: BR Code Status: Full code Family Communication: per primary Disposition: Neuro ICU  The patient is critically ill  with multiple organ systems failure and requires high complexity decision making for assessment and support, frequent evaluation and titration of therapies, application of advanced  monitoring technologies and extensive interpretation of multiple databases. Critical Care Time devoted to patient care services described in this note independent of APP/resident time (if applicable)  is 30 minutes.   Sherrilyn Rist MD [facility] Personal pager: [phone] If unanswered, please page CCM On-call: [phone]

## 2019-04-22 DIAGNOSIS — R509 Fever, unspecified: Secondary | ICD-10-CM

## 2019-04-22 DIAGNOSIS — Z7189 Other specified counseling: Secondary | ICD-10-CM

## 2019-04-22 DIAGNOSIS — D72829 Elevated white blood cell count, unspecified: Secondary | ICD-10-CM

## 2019-04-22 LAB — GLUCOSE, CAPILLARY
Glucose-Capillary: 132 mg/dL — ABNORMAL HIGH (ref 70–99)
Glucose-Capillary: 124 mg/dL — ABNORMAL HIGH (ref 70–99)
Glucose-Capillary: 126 mg/dL — ABNORMAL HIGH (ref 70–99)
Glucose-Capillary: 116 mg/dL — ABNORMAL HIGH (ref 70–99)
Glucose-Capillary: 132 mg/dL — ABNORMAL HIGH (ref 70–99)

## 2019-04-22 LAB — CBC WITH DIFFERENTIAL/PLATELET
Abs Immature Granulocytes: 0.38 10*3/uL — ABNORMAL HIGH (ref 0.00–0.07)
Basophils Absolute: 0.1 10*3/uL (ref 0.0–0.1)
Basophils Relative: 1 %
Eosinophils Absolute: 0.5 10*3/uL (ref 0.0–0.5)
Eosinophils Relative: 3 %
HCT: 33.4 % — ABNORMAL LOW (ref 39.0–52.0)
Hemoglobin: 9.8 g/dL — ABNORMAL LOW (ref 13.0–17.0)
Immature Granulocytes: 2 %
Lymphocytes Relative: 9 %
Lymphs Abs: 1.5 10*3/uL (ref 0.7–4.0)
MCH: 30 pg (ref 26.0–34.0)
MCHC: 29.3 g/dL — ABNORMAL LOW (ref 30.0–36.0)
MCV: 102.1 fL — ABNORMAL HIGH (ref 80.0–100.0)
Monocytes Absolute: 1.6 10*3/uL — ABNORMAL HIGH (ref 0.1–1.0)
Monocytes Relative: 9 %
Neutro Abs: 13 10*3/uL — ABNORMAL HIGH (ref 1.7–7.7)
Neutrophils Relative %: 76 %
Platelets: 246 10*3/uL (ref 150–400)
RBC: 3.27 MIL/uL — ABNORMAL LOW (ref 4.22–5.81)
RDW: 14.3 % (ref 11.5–15.5)
WBC: 17 10*3/uL — ABNORMAL HIGH (ref 4.0–10.5)
nRBC: 0.1 % (ref 0.0–0.2)

## 2019-04-22 LAB — POCT I-STAT 7, (LYTES, BLD GAS, ICA,H+H)
Acid-Base Excess: 6 mmol/L — ABNORMAL HIGH (ref 0.0–2.0)
Bicarbonate: 32.5 mmol/L — ABNORMAL HIGH (ref 20.0–28.0)
Calcium, Ion: 1.24 mmol/L (ref 1.15–1.40)
HCT: 26 % — ABNORMAL LOW (ref 39.0–52.0)
Hemoglobin: 8.8 g/dL — ABNORMAL LOW (ref 13.0–17.0)
O2 Saturation: 96 %
Patient temperature: 37.5
Potassium: 3.5 mmol/L (ref 3.5–5.1)
Sodium: 152 mmol/L — ABNORMAL HIGH (ref 135–145)
TCO2: 34 mmol/L — ABNORMAL HIGH (ref 22–32)
pCO2 arterial: 57.2 mmHg — ABNORMAL HIGH (ref 32.0–48.0)
pH, Arterial: 7.364 (ref 7.350–7.450)
pO2, Arterial: 92 mmHg (ref 83.0–108.0)

## 2019-04-22 LAB — TRIGLYCERIDES: Triglycerides: 214 mg/dL — ABNORMAL HIGH (ref <150)

## 2019-04-22 MED ORDER — SODIUM CHLORIDE 0.9 % IV BOLUS
1000.0000 mL | Freq: Once | INTRAVENOUS | Status: AC
  Administered 2019-04-22: 1000 mL via INTRAVENOUS

## 2019-04-22 MED ORDER — LOPERAMIDE HCL 1 MG/7.5ML PO SUSP
2.0000 mg | ORAL | Status: DC | PRN
  Administered 2019-04-22: 2 mg via ORAL
  Filled 2019-04-22: qty 15

## 2019-04-22 MED ORDER — FREE WATER
300.0000 mL | Freq: Three times a day (TID) | Status: DC
  Administered 2019-04-22 – 2019-04-23 (×4): 300 mL

## 2019-04-22 MED ORDER — LOPERAMIDE HCL 1 MG/7.5ML PO SUSP: 2.0000 mg | ORAL | Status: DC | PRN

## 2019-04-22 MED ORDER — VANCOMYCIN HCL 10 G IV SOLR
2000.0000 mg | INTRAVENOUS | Status: DC
  Filled 2019-04-22: qty 2000

## 2019-04-22 MED ORDER — VANCOMYCIN HCL 10 G IV SOLR
2500.0000 mg | Freq: Once | INTRAVENOUS | Status: AC
  Administered 2019-04-22: 2500 mg via INTRAVENOUS
  Filled 2019-04-22: qty 2500

## 2019-04-22 NOTE — Progress Notes (Signed)
Prognosis discussed with spouse  Progressive decline discussed Code status discussed

## 2019-04-22 NOTE — Progress Notes (Signed)
ET tube advanced, per order, from 26 to 29 cm @ lip.

## 2019-04-22 NOTE — Progress Notes (Signed)
Pharmacy Antibiotic Note  Jay Chen is a 53 y.o. male admitted on 04/12/2019 with sepsis.  Pharmacy has been consulted for vancomycin dosing.  Initially presented with R hemiparesis and asphasia, now s/p craniotomy and hematoma evacuation. Tonight, fever increased to 102.7 and was hypotensive. WBC 17, last Scr was 2.61 (normCrCl 32 mL/min). Has been on ceftriaxone for PNA - today is day #5. No significant growth on cx - MRSA PCR neg on 11/21.   Plan: Vancomycin 2500 mg IV once then 2g IV every 36 hours (estAUC 474)  Monitor renal fx, cx results, clinical pic, opportunities for de-escalation, and vanc levels as appropriate  Height: 5\' 11"  (180.3 cm) Weight: (!) 379 lb 6.6 oz (172.1 kg) IBW/kg (Calculated) : 75.3  Temp (24hrs), Avg:100.5 F (38.1 C), Min:98.2 F (36.8 C), Max:102.7 F (39.3 C)  Recent Labs  Lab 04/17/19 0309 04/18/19 0352 04/19/19 0411 04/20/19 0346 04/20/19 0832 04/21/19 0422 04/22/19 0607  WBC 15.9* 15.3* 10.5 12.1*  --  17.2* 17.0*  CREATININE 3.65* 2.68* 2.85*  --  2.69* 2.61*  --     Estimated Creatinine Clearance: 51.6 mL/min (A) (by C-G formula based on SCr of 2.61 mg/dL (H)).    No Known Allergies  Antimicrobials this admission: Ceftriaxone 11/25 >>  Vancomycin 11/29 >>   Dose adjustments this admission: N/A  Microbiology results: 11/23 BCx: 1/4 coag neg staph (likely contaminant) 11/29 BCx: ordered  11/25 Sputum: norm flora  11/21 MRSA PCR: neg  Thank you for allowing pharmacy to be a part of this patient's care.  Antonietta Jewel, PharmD, BCCCP Clinical Pharmacist  Phone: [phone]  Please check AMION for all [facility] phone numbers After 10:00 PM, call [place] [phone] 04/22/2019 8:32 PM

## 2019-04-22 NOTE — Progress Notes (Signed)
eLink Physician-Brief Progress Note Patient Name: Jay Chen DOB: May 22, 1966 MRN: [id number]   Date of Service  04/22/2019  HPI/Events of Note  Fever to 102.7 F.  and Hypotension with BP = 83/65. CVP = 11. LVEF = 55-60%.  eICU Interventions  Will order: 1. Cooling blanket PRN. 2. Bolus with 0.9 NaCl 1 liter over 1 hour now.  3. Blood cultures X 2.  4. Vancomycin per pharmacy consult.  5. D/C Stool softeners and laxatives.  6. D/C Imodium until C. Difficile is ruled out. Can't send PCR d/t stool softener administration last evening.      Intervention Category Major Interventions: Infection - evaluation and management  Sommer,Steven Eugene 04/22/2019, 8:19 PM

## 2019-04-22 NOTE — Consult Note (Signed)
Consultation Note Date: 04/22/2019   Patient Name: Jay Chen  DOB: 06-20-1965  MRN: [id number]  Age / Sex: 53 y.o., male  PCP: [name], MD Referring Physician: Garvin Fila, MD  Reason for Consultation: Establishing goals of care and Psychosocial/spiritual support  HPI/Patient Profile: 53 y.o. male  admitted on 04/07/2019 with  prior history of small left temporal lobe stroke in 2019, old microhemorrhages bilaterally on prior brain MRI, morbid obesity, a-fib with RVR on Xarelto, CKD, systolicheart failure with prior EF of 20 to 25% and most recent echo with EF of 50-55% in June, hypertension, diabetes, hyperlipidemia, thoracic aortic aneurysm and OSA, presenting with acute onset of right hemiplegia and dysphasia.   [name] He was seen normal by family, walking, then suddenly collapsed and was unable to get up, with right sided weakness. Family called EMS who noted the same deficits on arrival. CBG en route was 146. On arrival to the ED the patient had sonorous respirations and decreased level of consciousness. He could follow commands but had difficulty verbalizing with dysarthric, fragmentary speech. CT head reveals a large left temporal lobe acute hemorrhage.   Today is day 7 of this hospitalization.  He remains intubated.  Likely long term poor prognosis  Family face treatment option decisions, advanced directive decisions and anticipatory care needs.   Clinical Assessment and Goals of Care:  This NP Wadie Lessen reviewed medical records, received report from team, assessed the patient and then spoke to wife/telephone to discuss diagnosis, prognosis, GOC, EOL wishes disposition and options.  Concept of Palliative Care was discussed  Created space and opportunity for wife to explore her thoughts and feelings regarding current medical situation.  She is struggling with"putting it all together".  We  discussed the body as a  whole and how one system affects the other.  The importance to considering the patient as a whole when looking at treatment and recovery.  Emotional support offered  A  discussion was had today regarding advanced directives.  Concepts specific to code status, artifical feeding and hydration, continued IV antibiotics and rehospitalization was had.  The difference between a aggressive medical intervention path  and a palliative comfort care path for this patient at this time was had.  Values and goals of care important to patient and family were attempted to be elicited.    Questions and concerns addressed.   Family encouraged to call with questions or concerns.    PMT will continue to support holistically.     No docuemnted HPOA or AD.  WIfe is next of kin   SUMMARY OF RECOMMENDATIONS    Code Status/Advance Care Planning:  Full code  Additional Recommendations (Limitations, Scope, Preferences):  Full scope interventions, family remains hopeful for improvement.  Psycho-social/Spiritual:   Desire for further Chaplaincy support:Yes   Prognosis:   Long term prognosis is poor  Discharge Planning: to be determined     Primary Diagnoses: Present on Admission: . ICH (intracerebral hemorrhage) ([place]) . Intracerebral hematoma ([place])   I have  reviewed the medical record, interviewed the patient and family, and examined the patient. The following aspects are pertinent.  Past Medical History:  Diagnosis Date  . CKD (chronic kidney disease), stage III   . Congestive heart failure [facility]) May of 2011   Felt to have cor pulmonale; EF 45 to 50% from echo in May 2011  . Cor pulmonale (chronic) ([place])   . Gallstones   . Gout    "take daily RX" (02/15/2018)  . History of kidney stones   . Hyperlipidemia   . Hypertension   . Morbid obesity ([place])   . Persistent atrial fibrillation ([place])    [name] 12/28/2017  . Sleep apnea    "dx'd; couldn't tolerate CPAP"  (02/15/2018)  . Thoracic aneurysm    a. 4.8cm thoracic aortic aneurysm by CT 2013.  [name] Thoracic aortic aneurysm ([place])    known/notes 12/28/2017  . Type II diabetes mellitus ([place])    Social History   Socioeconomic History  . Marital status: Married    Spouse name: Not on file  . Number of children: 2  . Years of education: Not on file  . Highest education level: Bachelor's degree (e.g., BA, AB, BS)  Occupational History  . Occupation: unemployed  Social Needs  . Financial resource strain: Not hard at all  . Food insecurity    Worry: Never true    Inability: Never true  . Transportation needs    Medical: No    Non-medical: No  Tobacco Use  . Smoking status: Former Research scientist (life sciences)  . Smokeless tobacco: Never Used  . Tobacco comment: did in college  Substance and Sexual Activity  . Alcohol use: Not Currently    Comment: 02/15/2018 "nothing in the last couple years"  . Drug use: Never  . Sexual activity: Yes  Lifestyle  . Physical activity    Days per week: Not on file    Minutes per session: Not on file  . Stress: Not on file  Relationships  . Social Herbalist on phone: Not on file    Gets together: Not on file    Attends religious service: Not on file    Active member of club or organization: Not on file    Attends meetings of clubs or organizations: Not on file    Relationship status: Not on file  Other Topics Concern  . Not on file  Social History Narrative  . Not on file   Family History  Problem Relation Age of Onset  . Hypertension Mother   . Pancreatic cancer Father   . Prostate cancer Father   . Colon cancer Father    Scheduled Meds: . acetaminophen  650 mg Oral Q4H   Or  . acetaminophen (TYLENOL) oral liquid 160 mg/5 mL  650 mg Per Tube Q4H   Or  . acetaminophen  650 mg Rectal Q4H  . allopurinol  100 mg Per Tube Daily  . amiodarone  200 mg Per Tube Daily  . busPIRone  30 mg Oral Q8H   Or  . busPIRone  30 mg Per Tube Q8H  . chlorhexidine  gluconate (MEDLINE KIT)  15 mL Mouth Rinse BID  . Chlorhexidine Gluconate Cloth  6 each Topical Daily  . feeding supplement (PRO-STAT SUGAR FREE 64)  60 mL Per Tube 5 X Daily  . feeding supplement (VITAL HIGH PROTEIN)  1,000 mL Per Tube Q24H  . fentaNYL (SUBLIMAZE) injection  50 mcg Intravenous Once  . free water  300 mL  Per Tube Q8H  . heparin injection (subcutaneous)  5,000 Units Subcutaneous Q8H  . insulin aspart  0-20 Units Subcutaneous Q4H  . mouth rinse  15 mL Mouth Rinse 10 times per day  . metoprolol tartrate  50 mg Per Tube BID  . multivitamin with minerals  1 tablet Per Tube Daily  . pantoprazole sodium  40 mg Per Tube QHS   Continuous Infusions: . cefTRIAXone (ROCEPHIN)  IV Stopped (04/22/19 0956)  . fentaNYL infusion INTRAVENOUS 50 mcg/hr (04/22/19 1100)  . lactated ringers 50 mL/hr at 04/22/19 1100  . levETIRAcetam Stopped (04/22/19 0925)  . propofol (DIPRIVAN) infusion 10 mcg/kg/min (04/22/19 1100)   PRN Meds:.acetaminophen **OR** acetaminophen (TYLENOL) oral liquid 160 mg/5 mL **OR** acetaminophen, albuterol, bisacodyl, docusate, fentaNYL, hydrALAZINE, loperamide HCl Medications Prior to Admission:  Prior to Admission medications   Medication Sig Start Date End Date Taking? Authorizing Provider  acetaminophen (TYLENOL) 325 MG tablet Take 1-2 tablets (325-650 mg total) by mouth every 6 (six) hours as needed for mild pain. 03/01/18  Yes Love, Ivan Anchors, PA-C  allopurinol (ZYLOPRIM) 100 MG tablet TAKE 1 TABLET(100 MG) BY MOUTH DAILY Patient taking differently: Take 100 mg by mouth daily.  11/28/18  Yes [name], MD  amiodarone (PACERONE) 200 MG tablet Take 1 tablet (200 mg total) by mouth daily. 08/31/18  Yes Georgiana Shore, NP  aspirin EC 81 MG tablet Take 81 mg by mouth daily.   Yes [provider]  atorvastatin (LIPITOR) 80 MG tablet Take 80 mg by mouth daily.   Yes [provider]  Colchicine 0.6 MG CAPS Take 1 capsule by mouth 2 (two) times daily.  02/14/19  Yes [name], MD  hydrALAZINE (APRESOLINE) 100 MG tablet Take 1 tablet (100 mg total) by mouth 3 (three) times daily. 09/29/18  Yes Clegg, Amy D, NP  isosorbide mononitrate (IMDUR) 30 MG 24 hr tablet Take 1 tablet (30 mg total) by mouth daily. 09/29/18  Yes Clegg, Amy D, NP  losartan (COZAAR) 50 MG tablet Take 1 tablet (50 mg total) by mouth daily. Please cancel all previous orders for current medication. Change in dosage or pill size. 11/13/18  Yes Bensimhon, Shaune Pascal, MD  metoprolol succinate (TOPROL-XL) 100 MG 24 hr tablet Take 1 tablet (100 mg total) by mouth daily. 08/31/18  Yes Georgiana Shore, NP  omega-3 acid ethyl esters (LOVAZA) 1 g capsule Take 2 capsules (2 g total) by mouth 2 (two) times daily. 01/16/18  Yes Angiulli, Lavon Paganini, PA-C  potassium chloride SA (KLOR-CON M20) 20 MEQ tablet Take 2 tablets (40 mEq total) by mouth daily. 08/31/18  Yes Georgiana Shore, NP  rivaroxaban (XARELTO) 20 MG TABS tablet Take 20 mg by mouth daily with supper.   Yes [provider]  spironolactone (ALDACTONE) 25 MG tablet Take 0.5 tablets (12.5 mg total) by mouth daily. Please cancel all previous orders for current medication. Change in dosage or pill size. 08/31/18 04/22/2019 Yes Georgiana Shore, NP  torsemide (DEMADEX) 20 MG tablet TAKE 2 TABLETS(40 MG) BY MOUTH EVERY MORNING. MAY ALSO TAKE 1 TABLET 20 MG TOTAL AS NEEDED FOR WEIGHT 343 OR GREATER Patient taking differently: Take 40 mg by mouth daily. May also take 1 additional tablet as needed if weight is over 343 pounds 11/08/18  Yes Clegg, Amy D, NP   No Known Allergies Review of Systems  Unable to perform ROS: Intubated    Physical Exam Constitutional:      Appearance: He is  morbidly obese.     Interventions: He is intubated.  Cardiovascular:     Rate and Rhythm: Normal rate.  Pulmonary:     Effort: He is intubated.  Skin:    General: Skin is warm and dry.     Vital Signs: BP 96/68   Pulse 91   Temp (!) 100.6 F (38.1 C)    Resp 16   Ht 5' 11"  (1.803 m)   Wt (!) 172.1 kg   SpO2 99%   BMI 52.92 kg/m  Pain Scale: CPOT       SpO2: SpO2: 99 % O2 Device:SpO2: 99 % O2 Flow Rate: .   IO: Intake/output summary:   Intake/Output Summary (Last 24 hours) at 04/22/2019 1110 Last data filed at 04/22/2019 1100 Gross per 24 hour  Intake 1850.15 ml  Output 1250 ml  Net 600.15 ml    LBM: Last BM Date: 04/20/19 Baseline Weight: Weight: (!) 172.8 kg Most recent weight: Weight: (!) 172.1 kg     Palliative Assessment/Data:   Scheduled to meet with wife tomorrow at 2pm  Time In: 1030 Time Out: 1130 Time Total: 60 minutes Greater than 50%  of this time was spent counseling and coordinating care related to the above assessment and plan.  Signed by: Wadie Lessen, NP   Please contact Palliative Medicine Team phone at [phone] for questions and concerns.  For individual provider: See Shea Evans

## 2019-04-22 NOTE — Progress Notes (Signed)
NAME:  Jay Chen, MRN:  [id number], DOB:  July 06, 1965, LOS: 8 ADMISSION DATE:  03/31/2019, CONSULTATION DATE:  04/10/2019  REFERRING MD:  Billy Fischer, CHIEF COMPLAINT:  ICH, respiratory failure  Brief History   53 yo male with PMH as below presented to the ED on 11/21 with right hemiparesis and aphasia.  Emergently to CT --> ICH --> to OR for craniotomy and hematoma evacuation.  PCCM consulted for vent management.  Past Medical History  Atrial fibrillation on xarelto, Obesity,OSA,HTN,HLD DM,CHF,CKD,Gout,Gallstones.Thoracic aortic aneurysm  Significant Hospital Events   11/21> ICH. Intubated. To OR emergently.   Consults:  PCCM NSGY  Procedures:  ETT 11/21>>> CVC 11/21>> Crani 11/21>>  Significant Diagnostic Tests:  11/21 CT Head> L cerebral ICH 5.7 x 4.1 x 4.2 cm. Local mass effect. Mild edema.  11/21 Echo> EF 55-60%, increased LVH, Grade II diastolic dysfunction, no valvular abnormalities 11/22 MRI Head> ICH largely evacuated, some residual blood, persistent edema and mass effect, 1-2 mm shift 11/22 CT Head> Hematoma unchanged from MRI, minimal subarachnoid hemorrhage 11/23 CT Head> Extent of hemorrhage unchanged, stable edema, 2 mm L to R shift  Micro Data:  04/08/2019 SARS CoV2> neg  Antimicrobials:  Ceftriaxone 11/25>>(day 5)  Interim history/subjective:  No overnight events T-max of 100.2 Still with increasing PEEP and FiO2 requirement  Objective   Blood pressure 111/81, pulse 98, temperature 100.2 F (37.9 C), resp. rate 15, height 5\' 11"  (1.803 m), weight (!) 172.1 kg, SpO2 97 %.    Vent Mode: PRVC FiO2 (%):  [80 %-100 %] 80 % Set Rate:  [16 bmp] 16 bmp Vt Set:  [640 mL] 640 mL PEEP:  [18 cmH20] 18 cmH20 Plateau Pressure:  [29 cmH20-32 cmH20] 30 cmH20   Intake/Output Summary (Last 24 hours) at 04/22/2019 [id number] Last data filed at 04/22/2019 0900 Gross per 24 hour  Intake 2244.66 ml  Output 1250 ml  Net 994.66 ml   Filed Weights   04/18/19 0500  04/19/19 0500 04/20/19 0342  Weight: (!) 173.1 kg (!) 172.5 kg (!) 172.1 kg    Examination: Gen: Morbidly obese, comfortable HENT: Endotracheal tube in place, left temporal incision Pulm: Clear breath sounds anteriorly Card: S1-S2 appreciated GI: Bowel sounds appreciated GU: Fair output Neuro: Sedated Ext: Warm, dry, edema plus   Chest x-ray 11/26-reviewed showing basilar infiltrates, worse at right base  Resolved Hospital Problem list     Assessment & Plan:   Acute respiratory failure History of obstructive sleep apnea -PEEP requirements increased -FiO2 increased -Not able to wean at present -VAP bundle in place -Advance ET tube by 3 cm-chest x-ray shows tube riding high  Possible pneumonia -On Rocephin -Follow radiological changes  Fever -Day 5 of ceftriaxone -Stable leukocytosis  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 on 11/21 CT head stable -On Keppra for seizures -No anticoagulation -No evidence of ongoing seizures.  AKI on chronic kidney disease -Avoid nephrotoxic's -Renal dose medications -No acute need for renal replacement therapy -On lactated Ringer's  Prognosis is poor -Will require tracheostomy for long-term management -Not stable candidate for tracheostomy at present with increasing FiO2 and increasing PEEP   Best practice:  Diet: Continue tube feed Pain/Anxiety/Delirium protocol (if indicated): Propofol VAP protocol (if indicated): In place DVT prophylaxis: SCD GI prophylaxis: Protonix Glucose control: SSI Mobility: BR Code Status: Full code Family Communication: per primary Disposition: Neuro ICU  The patient is critically ill with multiple organ systems failure and requires high complexity decision making for assessment and support, frequent evaluation  and titration of therapies, application of advanced monitoring technologies and extensive interpretation of multiple databases. Critical Care Time devoted to patient care  services described in this note independent of APP/resident time (if applicable)  is 31 minutes.   Sherrilyn Rist MD [facility] Personal pager: [phone] If unanswered, please page CCM On-call: [phone]

## 2019-04-22 NOTE — Progress Notes (Signed)
STROKE TEAM PROGRESS NOTE   INTERVAL HISTORY Pt RN at bedside. Pt still intubated on vent and sedation. No movement on exam. However, as per RN, once sedation decreased, pt was able to move on the left side purposefully but still not following any commands. Palliative care on board.  OBJECTIVE Vitals:   04/22/19 0300 04/22/19 0318 04/22/19 0400 04/22/19 0500  BP: 111/76  103/66 102/75  Pulse: 81 90 85 81  Resp: 16 16 16 16   Temp: 99.5 F (37.5 C)  99.5 F (37.5 C) 99.5 F (37.5 C)  TempSrc:      SpO2: 99% 100% 97% 97%  Weight:      Height:        CBC:  Recent Labs  Lab 04/21/19 0422 04/22/19 0319 04/22/19 0607  WBC 17.2*  --  17.0*  NEUTROABS 13.5*  --  13.0*  HGB 9.8* 8.8* 9.8*  HCT 32.2* 26.0* 33.4*  MCV 100.6*  --  102.1*  PLT 243  --  0000000   Basic Metabolic Panel:  Recent Labs  Lab 04/18/19 0352  04/20/19 0832 04/21/19 0422 04/22/19 0319  NA 142   < > 151* 150* 152*  K 3.5   < > 3.6 3.6 3.5  CL 106   < > 108 109  --   CO2 27   < > 29 30  --   GLUCOSE 137*   < > 121* 130*  --   BUN 74*   < > 124* 134*  --   CREATININE 2.68*   < > 2.69* 2.61*  --   CALCIUM 8.3*   < > 8.7* 8.6*  --   MG 2.2  --   --  2.7*  --   PHOS 4.1  --   --  3.8  --    < > = values in this interval not displayed.   Lipid Panel:     Component Value Date/Time   CHOL 116 01/01/2018 0159   TRIG 125 04/21/2019 0422   HDL 24 (L) 01/01/2018 0159   CHOLHDL 4.8 01/01/2018 0159   VLDL 17 01/01/2018 0159   LDLCALC 75 01/01/2018 0159   HgbA1c:  Lab Results  Component Value Date   HGBA1C 5.8 (H) 03/31/2019   Urine Drug Screen: No results found for: LABOPIA, COCAINSCRNUR, LABBENZ, AMPHETMU, THCU, LABBARB  Alcohol Level No results found for: [place] Head Code Stroke Wo Contrast 04/03/2019 IMPRESSION:  49 cc hematoma in the left cerebral white matter as described. White matter infarct and small lobar micro hemorrhages were seen on a 2019 brain MRI.   Ct Code Stroke Cta Head  W/wo Contrast 03/28/2019 IMPRESSION:  1. No evidence of aneurysm, vascular malformation, or vasculitis.  2. There are areas of CTA spot sign which increase changes of hematoma growth.   Mr Brain Wo Contrast 04/15/2019 IMPRESSION:  1. Postoperative changes from interval left temporal craniotomy for hematoma evacuation. Previously seen large left temporal lobe intraparenchymal hemorrhage has largely been evacuated, with residual blood products seen within the resection cavity. Persistent localized edema and regional mass effect with trace 1-2 mm of left-to-right shift.  2. Associated intraventricular extension with small volume hemorrhage throughout the ventricular system. No hydrocephalus or ventricular trapping.  3. Small postoperative extra-axial collection and pneumocephalus overlying the left frontotemporal convexity without significant mass effect.   CT Head WO Contrast  04/15/2019 Unchanged left temporal hematoma and intraventricular clot when compared to prior MRI. Minimal subarachnoid hemorrhage is seen along the  right sylvian fissure, likely redistribution. No midline shift and no ventricular obstruction.   Ct Head Wo Contrast 04/16/2019 Somewhat less air in the area of hemorrhage and in the left frontal regions compared to 1 day prior. Extent of hemorrhage is virtually identical with marginally less hemorrhage seen in the left lateral ventricle compared to 1 day prior. Stable mild surrounding edema in the left temporal region. There is currently slight increased effacement of the left lateral ventricle compared to 1 day prior, best seen on coronal images. There is 2 mm of midline shift to the right. No new brain parenchymal lesion evident. Foci of paranasal sinus disease and mastoid disease noted.   Dg Chest Port 1 View 04/19/2019 1. Mild stable cardiomegaly with findings suggesting mild interstitial edema. Hazy density right base with slight interval improvement likely part of the  interstitial edema although infection is possible.  04/17/2019 1. Increased right basilar opacities. Unchanged small left pleural effusion. 2. Slight retraction of endotracheal tube. Otherwise unchanged support apparatus. 04/16/2019 1. Somewhat high positioning of the endotracheal tube, approximately 8 cm from the carina. Could be advanced 3 cm to the mid trachea. 2. Right IJ catheter in the mid SVC. 3. Satisfactory positioning of the transesophageal tube. 4. Unchanged dense retrocardiac opacity, likely a combination of pleural effusion and airspace disease. 5. Improving atelectasis and interstitial opacity elsewhere.  04/02/2019 1. New right IJ central venous catheter with the tip overlying the mid SVC.  2. No evidence of pneumothorax or other complication.  3. Overall improved aeration with decreased right basilar atelectasis and decreased interstitial edema versus vascular crowding.  4. Persistent dense left basilar airspace opacity.  04/20/2019 Endotracheal tube tip 3.7 cm above the carina. Low volume film with cardiomegaly, vascular congestion and interstitial pulmonary edema. Bibasilar collapse/consolidation, left greater than right.   Transthoracic Echocardiogram  04/17/2019  1. Left ventricular ejection fraction, by visual estimation, is 55 to 60%. The left ventricle has normal function. There is severely increased left ventricular hypertrophy.  2. Elevated left atrial pressure.  3. Left ventricular diastolic parameters are consistent with Grade II diastolic dysfunction (pseudonormalization).  4. The left ventricle has no regional wall motion abnormalities.  5. Global right ventricle has normal systolic function.The right ventricular size is normal. No increase in right ventricular wall thickness.  6. Left atrial size was mildly dilated.  7. Right atrial size was normal.  8. Small pericardial effusion.  9. The pericardial effusion is circumferential. 10. The mitral valve is normal in  structure. No evidence of mitral valve regurgitation. 11. The tricuspid valve is normal in structure. Tricuspid valve regurgitation is not demonstrated. 12. The aortic valve is normal in structure. Aortic valve regurgitation is trivial. 13. The pulmonic valve was normal in structure. Pulmonic valve regurgitation is not visualized. 14. Aortic dilatation noted. 15. There is mild dilatation of the aortic root measuring 42 mm. 16. TR signal is inadequate for assessing pulmonary artery systolic pressure. 17. The inferior vena cava is dilated in size with <50% respiratory variability, suggesting right atrial pressure of 15 mmHg.  Transthoracic Echocardiogram  04/17/2019 By CCM. LVH with normal LV size and function. RV mildly dilated with mildly reduced function. No septal dyssynchrony. No significant valvular lesions. Indeterminate diastolic function due AF, filling pressures appear normal. Unable to calculate RVSP but PAP likely normal by PVAT. PA seen to bifurcation with no dilatation. IVC not visualized. TDS.  ECG - SR rate 61 BPM. (See cardiology reading for complete details)  EEG 04/16/2019 This study  is suggestive of cortical dysfunction in left frontotemporal region likely secondary to underlying ICH.  Additionally there is evidence of severe diffuse encephalopathy, nonspecific etiology but could be secondary to sedated state.  No seizures or epileptiform discharges were seen throughout the recording.  LT EEG 04/17/2019 This study issuggestive of cortical dysfunction in left frontotemporal region likely secondary to underlying ICH.Additionally there is evidence of severe diffuse encephalopathy, nonspecific etiology but could be secondary to sedated state. No seizures or epileptiform discharges were seen throughout the recording.   PHYSICAL EXAM      Temp:  [96.4 F (35.8 C)-100.8 F (38.2 C)] 99.5 F (37.5 C) (11/29 0500) Pulse Rate:  [40-100] 81 (11/29 0500) Resp:  [16-27] 16  (11/29 0500) BP: (96-145)/(66-97) 102/75 (11/29 0500) SpO2:  [92 %-100 %] 97 % (11/29 0500) FiO2 (%):  [80 %-100 %] 90 % (11/29 0318)  General - Well nourished, well developed, intubated on sedation.  Ophthalmologic - fundi not visualized due to noncooperation.  Cardiovascular - irregularly irregular heart rate and rhythm, afib with RVR.  Neuro - intubated on sedation, eyes closed, not following commands. With forced eye opening, eyes in mid position, not blinking to visual threat, doll's eyes absent, not tracking, bilateral pupil small 3mm, non reactive to light. Corneal reflex present on the left, absent on the right, gag and cough present. Breathing over the vent.  Facial symmetry not able to test due to ET tube.  Tongue protrusion not cooperative. On pain stimulation, no movement on all extremities. DTR 1+ and left babinski. Sensation, coordination and gait not tested.    ASSESSMENT/PLAN [name] is a 53 y.o. male with history of small left temporal lobe stroke in 2019, old microhemorrhages bilaterally on prior brain MRI, morbid obesity, a-fib with RVR on Xarelto, CKD, systolic heart failure with prior EF of 20 to 25% and most recent echo with EF of 50-55% in June, hypertension, diabetes, hyperlipidemia, thoracic aortic aneurysm and OSA, presenting with acute onset of right hemiplegia and dysphasia. He did not receive IV t-PA due to [place].  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   CTA Head and neck - No evidence of aneurysm, AVM, or vasculitis. There are areas of CTA spot sign which increase changes of hematoma growth.   MRI head - Postoperative changes from interval left temporal craniotomy for hematoma evacuation. trace 1-2 mm of left-to-right shift. Associated intraventricular extension with small volume hemorrhage throughout  the ventricular system.  Repeat CT - improved air in the left frontal region but stable hemorrhage and mild surrounding edema  2D Echo - EF 60 - 65%.  EEG, LT EEG no sz, L temporal lobe cortical dysfunction  [employer] Virus 2 - negative  LDL - 75  HgbA1c - 5.8  VTE prophylaxis - heparin subq  Xarelto (rivaroxaban) daily prior to admission, now on No antithrombotic given hemorrhage   Therapy recommendations:  Pending  Disposition:  Pending, palliative care consult pending.  Acute Hypoxic Respiratory Failure w/ refractory hypoxia Volume Overload / Atelectasis / Aspiration in setting of obesity  Intubated and sedated  CCM onboard  Surgery consulted for trach placement - plan to place next week in the OR if aggressive care is pursued. Possible PEG at the same time.  On empiric rocephin started 11/25 for CAP -> with high grade fever -> add vanco  CCM managing weaning/ventilator strategies  Fever / Leukocytosis / sepsis  Temp - 100.8->100->99.5->102.7  Leukocytosis - 10.5->12.1->17.2->17.0  CXR - 11/28 - mild right base atelectasis  Rocephin started 11/25 for pneumonia  Blood cultures - 11/23 - Staph Coag Neg - left hand - possible contaminant - left forearm no growth x 4 days  Respiratory cultures - 11/25 - rare Gram Positive Cocci in pairs - final - CCM onboard  On vanco now  Cerebral edema and regional mass effect   Mannitol - 25 g x 1 - 03/29/2019  Temporal Craniotomy - Dr Christella Noa - 04/19/2019  Seizure prophylaxis -> Keppra  Repeat CT Head 04/16/19 - improved air in the left frontal region but stable hemorrhage and mild surrounding edema with slight increase effacement of left lateral ventricle with 2 mm shift.    Hypertonic therapy if edema worsens - not felt to be indicated at this time  Atrial Fibrillation w/ RVR  Home anticoagulation:  Xarelto (rivaroxaban) daily   No AC given ICH  Rate controlled  On amiodarone and metoprolol 50  Hypertensive  Emergency  BP on admission 194/94  Home BP meds: Hydralazine ; Cozaar ; Metoprolol  Treated with Cleviprex, now off    Current BP meds: metoprolol 50 bid  Stable  . SBP goal < 160 mmHg . Prn labetalol   Hyperlipidemia  Home Lipid lowering medication: Lipitor 80 mg daily  LDL 75, goal < 70  Current lipid lowering medication: hold statin given ICH  Continue statin at discharge  Dysphagia . Secondary to stroke . NPO . cortrak placed 11/24, on tube feedings . Speech on board . Likely will need PEG next week if medically stable and aggressive care is planned - Surgical consult note states PEG could possibly performed at same time as trach.   Other Stroke Risk Factors  Former cigarette smoker - quit  Previous ETOH use  Morbid Obesity, Body mass index is 52.92 kg/m., recommend weight loss, diet and exercise as appropriate   Hx stroke/TIA 2019  Obstructive sleep apnea, not on CPAP at home  H/O diastolic Congestive Heart Failure.    Other Active Problems  Difficult foley placement -> Dr Junious Silk - leave foley in place  AKI on CKD - creatinine baseline 1.7 - now 2.85->2.69->2.61  Hypokalemia K - 3.5->3.6->3.6->3.5  Anemia - Hb - 10.0->9.8   Hypernatremia - 142->151->150->152   Hospital day # 8   Patient remains critically ill due to respiratory failure from aspiration pneumonia and underlying CHF and COPD.  He is requiring sedation to help ventilate him. This patient is critically ill and at significant risk of neurological worsening, death and care requires constant monitoring of vital signs, hemodynamics,respiratory and cardiac monitoring, extensive review of multiple databases, frequent neurological assessment, discussion with family, other specialists and medical decision making of high complexity. I spent 35 minutes of neurocritical care time  in the care of  this patient.  Rosalin Hawking, MD PhD Stroke Neurology 04/22/2019 9:46 PM  To contact Stroke  Continuity provider, please refer to [email or web address] After hours, contact General Neurology

## 2019-04-23 ENCOUNTER — Inpatient Hospital Stay (HOSPITAL_COMMUNITY): Payer: BC Managed Care – PPO

## 2019-04-23 DIAGNOSIS — L8990 Pressure ulcer of unspecified site, unspecified stage: Secondary | ICD-10-CM | POA: Diagnosis present

## 2019-04-23 DIAGNOSIS — J9600 Acute respiratory failure, unspecified whether with hypoxia or hypercapnia: Secondary | ICD-10-CM

## 2019-04-23 DIAGNOSIS — I4821 Permanent atrial fibrillation: Secondary | ICD-10-CM

## 2019-04-23 DIAGNOSIS — J9621 Acute and chronic respiratory failure with hypoxia: Secondary | ICD-10-CM

## 2019-04-23 DIAGNOSIS — Z515 Encounter for palliative care: Secondary | ICD-10-CM

## 2019-04-23 DIAGNOSIS — E870 Hyperosmolality and hypernatremia: Secondary | ICD-10-CM

## 2019-04-23 LAB — GLUCOSE, CAPILLARY
Glucose-Capillary: 122 mg/dL — ABNORMAL HIGH (ref 70–99)
Glucose-Capillary: 135 mg/dL — ABNORMAL HIGH (ref 70–99)
Glucose-Capillary: 141 mg/dL — ABNORMAL HIGH (ref 70–99)
Glucose-Capillary: 149 mg/dL — ABNORMAL HIGH (ref 70–99)
Glucose-Capillary: 160 mg/dL — ABNORMAL HIGH (ref 70–99)
Glucose-Capillary: 168 mg/dL — ABNORMAL HIGH (ref 70–99)
Glucose-Capillary: 187 mg/dL — ABNORMAL HIGH (ref 70–99)

## 2019-04-23 LAB — POCT I-STAT 7, (LYTES, BLD GAS, ICA,H+H)
Acid-Base Excess: 3 mmol/L — ABNORMAL HIGH (ref 0.0–2.0)
Bicarbonate: 27.6 mmol/L (ref 20.0–28.0)
Calcium, Ion: 1.2 mmol/L (ref 1.15–1.40)
HCT: 30 % — ABNORMAL LOW (ref 39.0–52.0)
Hemoglobin: 10.2 g/dL — ABNORMAL LOW (ref 13.0–17.0)
O2 Saturation: 83 %
Patient temperature: 39
Potassium: 3.8 mmol/L (ref 3.5–5.1)
Sodium: 154 mmol/L — ABNORMAL HIGH (ref 135–145)
TCO2: 29 mmol/L (ref 22–32)
pCO2 arterial: 45.3 mmHg (ref 32.0–48.0)
pH, Arterial: 7.4 (ref 7.350–7.450)
pO2, Arterial: 53 mmHg — ABNORMAL LOW (ref 83.0–108.0)

## 2019-04-23 LAB — CBC WITH DIFFERENTIAL/PLATELET
Abs Immature Granulocytes: 0.64 10*3/uL — ABNORMAL HIGH (ref 0.00–0.07)
Basophils Absolute: 0.1 10*3/uL (ref 0.0–0.1)
Basophils Relative: 0 %
Eosinophils Absolute: 0.2 10*3/uL (ref 0.0–0.5)
Eosinophils Relative: 2 %
HCT: 32.9 % — ABNORMAL LOW (ref 39.0–52.0)
Hemoglobin: 9.7 g/dL — ABNORMAL LOW (ref 13.0–17.0)
Immature Granulocytes: 5 %
Lymphocytes Relative: 8 %
Lymphs Abs: 1.1 10*3/uL (ref 0.7–4.0)
MCH: 30.3 pg (ref 26.0–34.0)
MCHC: 29.5 g/dL — ABNORMAL LOW (ref 30.0–36.0)
MCV: 102.8 fL — ABNORMAL HIGH (ref 80.0–100.0)
Monocytes Absolute: 1.2 10*3/uL — ABNORMAL HIGH (ref 0.1–1.0)
Monocytes Relative: 9 %
Neutro Abs: 9.7 10*3/uL — ABNORMAL HIGH (ref 1.7–7.7)
Neutrophils Relative %: 76 %
Platelets: 220 10*3/uL (ref 150–400)
RBC: 3.2 MIL/uL — ABNORMAL LOW (ref 4.22–5.81)
RDW: 14.3 % (ref 11.5–15.5)
WBC: 12.9 10*3/uL — ABNORMAL HIGH (ref 4.0–10.5)
nRBC: 0.2 % (ref 0.0–0.2)

## 2019-04-23 LAB — MAGNESIUM: Magnesium: 2.4 mg/dL (ref 1.7–2.4)

## 2019-04-23 LAB — C DIFFICILE QUICK SCREEN W PCR REFLEX
C Diff antigen: NEGATIVE
C Diff interpretation: NOT DETECTED
C Diff toxin: NEGATIVE

## 2019-04-23 LAB — BASIC METABOLIC PANEL
Anion gap: 10 (ref 5–15)
Anion gap: 11 (ref 5–15)
Anion gap: 12 (ref 5–15)
BUN: 139 mg/dL — ABNORMAL HIGH (ref 6–20)
BUN: 134 mg/dL — ABNORMAL HIGH (ref 6–20)
BUN: 139 mg/dL — ABNORMAL HIGH (ref 6–20)
CO2: 31 mmol/L (ref 22–32)
CO2: 30 mmol/L (ref 22–32)
CO2: 26 mmol/L (ref 22–32)
Calcium: 8.6 mg/dL — ABNORMAL LOW (ref 8.9–10.3)
Calcium: 8.5 mg/dL — ABNORMAL LOW (ref 8.9–10.3)
Calcium: 8.4 mg/dL — ABNORMAL LOW (ref 8.9–10.3)
Chloride: 116 mmol/L — ABNORMAL HIGH (ref 98–111)
Chloride: 116 mmol/L — ABNORMAL HIGH (ref 98–111)
Chloride: 116 mmol/L — ABNORMAL HIGH (ref 98–111)
Creatinine, Ser: 2.55 mg/dL — ABNORMAL HIGH (ref 0.61–1.24)
Creatinine, Ser: 2.45 mg/dL — ABNORMAL HIGH (ref 0.61–1.24)
Creatinine, Ser: 2.62 mg/dL — ABNORMAL HIGH (ref 0.61–1.24)
GFR calc Af Amer: 32 mL/min — ABNORMAL LOW (ref >60)
GFR calc Af Amer: 33 mL/min — ABNORMAL LOW (ref >60)
GFR calc Af Amer: 31 mL/min — ABNORMAL LOW (ref >60)
GFR calc non Af Amer: 27 mL/min — ABNORMAL LOW (ref >60)
GFR calc non Af Amer: 29 mL/min — ABNORMAL LOW (ref >60)
GFR calc non Af Amer: 26 mL/min — ABNORMAL LOW (ref >60)
Glucose, Bld: 155 mg/dL — ABNORMAL HIGH (ref 70–99)
Glucose, Bld: 169 mg/dL — ABNORMAL HIGH (ref 70–99)
Glucose, Bld: 186 mg/dL — ABNORMAL HIGH (ref 70–99)
Potassium: 3.4 mmol/L — ABNORMAL LOW (ref 3.5–5.1)
Potassium: 3.6 mmol/L (ref 3.5–5.1)
Potassium: 3.8 mmol/L (ref 3.5–5.1)
Sodium: 157 mmol/L — ABNORMAL HIGH (ref 135–145)
Sodium: 157 mmol/L — ABNORMAL HIGH (ref 135–145)
Sodium: 154 mmol/L — ABNORMAL HIGH (ref 135–145)

## 2019-04-23 LAB — TRIGLYCERIDES: Triglycerides: 133 mg/dL (ref <150)

## 2019-04-23 LAB — SODIUM: Sodium: 155 mmol/L — ABNORMAL HIGH (ref 135–145)

## 2019-04-23 MED ORDER — SODIUM CHLORIDE 0.9 % IV SOLN
2.0000 g | Freq: Two times a day (BID) | INTRAVENOUS | Status: DC
  Administered 2019-04-23 – 2019-04-24 (×2): 2 g via INTRAVENOUS
  Filled 2019-04-23 (×3): qty 2

## 2019-04-23 MED ORDER — FREE WATER
200.0000 mL | Status: DC
  Administered 2019-04-23 – 2019-04-24 (×3): 200 mL

## 2019-04-23 MED ORDER — MIDAZOLAM HCL 2 MG/2ML IJ SOLN
2.0000 mg | INTRAMUSCULAR | Status: DC | PRN
  Filled 2019-04-23: qty 2

## 2019-04-23 MED ORDER — FENTANYL CITRATE (PF) 100 MCG/2ML IJ SOLN
INTRAMUSCULAR | Status: AC
  Filled 2019-04-23: qty 2

## 2019-04-23 MED ORDER — FENTANYL CITRATE (PF) 100 MCG/2ML IJ SOLN
50.0000 ug | INTRAMUSCULAR | Status: DC | PRN
  Filled 2019-04-23: qty 2

## 2019-04-23 MED ORDER — FENTANYL CITRATE (PF) 100 MCG/2ML IJ SOLN
50.0000 ug | INTRAMUSCULAR | Status: DC | PRN
  Administered 2019-04-23 – 2019-04-24 (×2): 50 ug via INTRAVENOUS
  Filled 2019-04-23: qty 2

## 2019-04-23 MED ORDER — FUROSEMIDE 10 MG/ML IJ SOLN
40.0000 mg | Freq: Once | INTRAMUSCULAR | Status: AC
  Administered 2019-04-23: 40 mg via INTRAVENOUS
  Filled 2019-04-23: qty 4

## 2019-04-23 MED ORDER — DEXTROSE 5 % IV SOLN
INTRAVENOUS | Status: DC
  Administered 2019-04-23: 10:00:00 via INTRAVENOUS

## 2019-04-23 MED ORDER — PRO-STAT SUGAR FREE PO LIQD
60.0000 mL | Freq: Three times a day (TID) | ORAL | Status: DC
  Administered 2019-04-23: 60 mL
  Filled 2019-04-23: qty 60

## 2019-04-23 MED ORDER — VITAL HIGH PROTEIN PO LIQD: 1000.0000 mL | ORAL | Status: DC

## 2019-04-23 MED ORDER — POTASSIUM CHLORIDE 20 MEQ/15ML (10%) PO SOLN
30.0000 meq | ORAL | Status: AC
  Administered 2019-04-23 (×2): 30 meq
  Filled 2019-04-23: qty 30

## 2019-04-23 MED ORDER — AMIODARONE IV BOLUS ONLY 150 MG/100ML
150.0000 mg | Freq: Once | INTRAVENOUS | Status: AC
  Administered 2019-04-23: 150 mg via INTRAVENOUS
  Filled 2019-04-23: qty 100

## 2019-04-23 NOTE — Progress Notes (Signed)
Pharmacy Antibiotic Note  Jay Chen is a 53 y.o. male admitted on 03/27/2019 with right-sided weakness s/p craniotomy and hematoma evacuation. Patient was initially started on Rocephin for PNA then vancomycin added 04/22/19 due to fever and hypotension, now to change from Rocephin to Cefepime.  Renal function is improving slowly, Tmax 102.4, WBC down to 12.9.  Plan: Continue vanc 2gm IV Q36H for AUC 474 using SCr 2.61 Cefepime 2gm IV Q12H Monitor renal fxn, clinical progress, vanc level as indicated  Height: 5\' 11"  (180.3 cm) Weight: (!) 379 lb 6.6 oz (172.1 kg) IBW/kg (Calculated) : 75.3  Temp (24hrs), Avg:101.4 F (38.6 C), Min:98.6 F (37 C), Max:102.7 F (39.3 C)  Recent Labs  Lab 04/18/19 0352 04/19/19 0411 04/20/19 0346 04/20/19 0832 04/21/19 0422 04/22/19 0607 04/23/19 0532  WBC 15.3* 10.5 12.1*  --  17.2* 17.0* 12.9*  CREATININE 2.68* 2.85*  --  2.69* 2.61*  --  2.55*    Estimated Creatinine Clearance: 52.8 mL/min (A) (by C-G formula based on SCr of 2.55 mg/dL (H)).    No Known Allergies  CTX 11/25 >> 11/30 Vanc 11/29 >> Cefepime 11/30 >>  11/23 BCx - 1 of 2 CoNS 11/21 MRSA PCR - negative 11/25 TA - negative 11/29 BCx - NGTD 11/30 C.diff -   Jay Chen D. Mina Marble, PharmD, BCPS, [place] 04/23/2019, 1:55 PM

## 2019-04-23 NOTE — Progress Notes (Signed)
Recruitment maneuver performed Sp02 increased to 94%.  Will continue to monitor.

## 2019-04-23 NOTE — Progress Notes (Signed)
Nutrition Follow-up  DOCUMENTATION CODES:   Morbid obesity  INTERVENTION:   Increase Vital High Protein to 55 ml/hr via Cortrak tube 60 ml Prostat TID  Provides: 1920 kcal, 205 grams protein, and 1103 ml free water.   NUTRITION DIAGNOSIS:   Inadequate oral intake related to inability to eat as evidenced by NPO status. Ongoing.   GOAL:   Provide needs based on ASPEN/SCCM guidelines Met.   MONITOR:   Vent status, TF tolerance  REASON FOR ASSESSMENT:   Consult, Ventilator Enteral/tube feeding initiation and management  ASSESSMENT:   Pt with PMH of obesity, OSA, HTN, HLD, DM, CHF, CKD, thoracic aortic aneurysm admitted 11/21 with R hemiparesis and aphasia. Dx with large L ICH s/p crani.   Pt with ARDS, unable to prone due to craniotomy  PEEP @ 18 and 90% O2   Pt with poor prognosis and palliative care meeting with family  Patient is currently intubated on ventilator support MV: 16.8 L/min Temp (24hrs), Avg:101.4 F (38.6 C), Min:98.6 F (37 C), Max:102.7 F (39.3 C)    Medications and Labs reviewed 300 ml free water every 8 hours = 900 ml KCl runs x 3 Na: 157 (H), K+ 3.4 (L)   Diet Order:   Diet Order            Diet NPO time specified  Diet effective now              EDUCATION NEEDS:   Not appropriate for education at this time  Skin:  Skin Assessment: Skin Integrity Issues: Skin Integrity Issues:: Stage II Stage II: buttocks  Last BM:  650 ml via rectal tube  Height:   Ht Readings from Last 1 Encounters:  04/16/19 5' 11"  (1.803 m)    Weight:   Wt Readings from Last 1 Encounters:  04/20/19 (!) 172.1 kg    Ideal Body Weight:  78.1 kg  BMI:  Body mass index is 52.92 kg/m.  Estimated Nutritional Needs:   Kcal:  3716-9678  Protein:  195 grams  Fluid:  >1.5 L/day  Maylon Peppers RD, [place], CNSC [phone] Pager [phone] After Hours Pager

## 2019-04-23 NOTE — Progress Notes (Signed)
ABG drawn, abnormal results given to Dr. Talbert Nan. No changes at this time. Will continue to monitor.

## 2019-04-23 NOTE — Procedures (Signed)
Arterial Catheter Insertion Procedure Note Jay Chen [id number] July 28, 1965  Procedure: Insertion of Arterial Catheter  Indications: Blood pressure monitoring  Procedure Details Consent: Unable to obtain consent because of emergent medical necessity. Time Out: Verified patient identification, verified procedure, site/side was marked, verified correct patient position, special equipment/implants available, medications/allergies/relevent history reviewed, required imaging and test results available.  Performed  Maximum sterile technique was used including antiseptics, cap, gloves, gown, hand hygiene, mask and sheet. Skin prep: Chlorhexidine; local anesthetic administered 20 gauge catheter was inserted into right radial artery using the Seldinger technique. ULTRASOUND GUIDANCE USED: NO Evaluation Blood flow good; BP tracing good. Complications: No apparent complications.   Graciella Freer 04/23/2019

## 2019-04-23 NOTE — Progress Notes (Addendum)
NAME:  Jay Chen, MRN:  [id number], DOB:  1965/09/21, LOS: 9 ADMISSION DATE:  03/30/2019, CONSULTATION DATE:  04/04/2019  REFERRING MD:  Billy Fischer, CHIEF COMPLAINT:  ICH, respiratory failure  Brief History   53 yo male with PMH as below presented to the ED on 11/21 with right hemiparesis and aphasia.  Emergently to CT --> ICH --> to OR for craniotomy and hematoma evacuation.  PCCM consulted for vent management.  Past Medical History  Atrial fibrillation on xarelto, Obesity,OSA,HTN,HLD DM,CHF,CKD,Gout,Gallstones.Thoracic aortic aneurysm  Significant Hospital Events   11/21> ICH. Intubated. To OR emergently.   Consults:  PCCM NSGY  Procedures:  ETT 11/21>>> CVC 11/21>> Crani 11/21>>  Significant Diagnostic Tests:  11/21 CT Head> L cerebral ICH 5.7 x 4.1 x 4.2 cm. Local mass effect. Mild edema.  11/21 Echo> EF 55-60%, increased LVH, Grade II diastolic dysfunction, no valvular abnormalities 11/22 MRI Head> ICH largely evacuated, some residual blood, persistent edema and mass effect, 1-2 mm shift 11/22 CT Head> Hematoma unchanged from MRI, minimal subarachnoid hemorrhage 11/23 CT Head> Extent of hemorrhage unchanged, stable edema, 2 mm L to R shift  Micro Data:  03/27/2019 SARS CoV2> neg Blood cultures 11/23-1/2 coag negative staph Blood cultures 11/29-negative  Antimicrobials:  Ceftriaxone 11/25>> Vanco 11/29 >>  Interim history/subjective:  Febrile to 101.  Remains on high PEEP/FiO2 Has significant diarrhea.  Objective   Blood pressure 116/83, pulse (!) 118, temperature (!) 101.1 F (38.4 C), resp. rate (!) 23, height 5\' 11"  (1.803 m), weight (!) 172.1 kg, SpO2 92 %. CVP:  [10 mmHg-13 mmHg] 12 mmHg  Vent Mode: PRVC FiO2 (%):  [80 %-100 %] 90 % Set Rate:  [16 bmp] 16 bmp Vt Set:  [640 mL] 640 mL PEEP:  [18 cmH20] 18 cmH20 Plateau Pressure:  [30 cmH20-35 cmH20] 35 cmH20   Intake/Output Summary (Last 24 hours) at 04/23/2019 0834 Last data filed at 04/23/2019  0700 Gross per 24 hour  Intake 6167.58 ml  Output 3225 ml  Net 2942.58 ml   Filed Weights   04/18/19 0500 04/19/19 0500 04/20/19 0342  Weight: (!) 173.1 kg (!) 172.5 kg (!) 172.1 kg    Examination: Gen:      No acute distress HEENT:  EOMI, sclera anicteric Neck:     No masses; no thyromegaly, ETT Lungs:    Clear to auscultation bilaterally; normal respiratory effort CV:         Regular rate and rhythm; no murmurs Abd:      + bowel sounds; soft, non-tender; no palpable masses, no distension Ext:    No edema; adequate peripheral perfusion Skin:      Warm and dry; no rash Neuro: Sedated, unresponsive  Resolved Hospital Problem list     Assessment & Plan:   Acute respiratory failure, ARDS History of obstructive sleep apnea Continues on high PEEP/FiO2 requirements. Repeat chest x-ray Will not prone due to craniotomy Lasix 40 mg x 1  Possible pneumonia On Rocephin > change to cefepime  Atrial fibrillation On amiodarone, Lopressor Anticoagulation on hold due to intracranial hemorrhage.  Fever, leukocytosis Continues on abx.  Vanco started over the weekend Add cefepime As he has significant diarrhea we will check for C. difficile  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 on 11/21 CT head stable On Keppra for seizures No anticoagulation No evidence of ongoing seizures.  AKI on chronic kidney disease, hypernatremia Avoid nephrotoxic's Renal dose medications No acute need for renal replacement therapy We will change Ringer's lactate  to D5 to help with increasing sodium.  Diabetes SSI coverage  Goals of care Has poor prognosis Family updated over the weekend by PCCM Palliative care to meet today Will require trach, PEG when PEEP requirements are better.   Best practice:  Diet: Continue tube feed Pain/Anxiety/Delirium protocol (if indicated): Off sedation VAP protocol (if indicated): In place DVT prophylaxis: SCD GI prophylaxis: Protonix  Glucose control: SSI Mobility: BR Code Status: Full code Family Communication: per primary Disposition: Neuro ICU  The patient is critically ill with multiple organ system failure and requires high complexity decision making for assessment and support, frequent evaluation and titration of therapies, advanced monitoring, review of radiographic studies and interpretation of complex data.   Critical Care Time devoted to patient care services, exclusive of separately billable procedures, described in this note is 35 minutes.   Marshell Garfinkel MD [facility] Pager [phone] If no answer call [phone] 04/23/2019, 8:53 AM

## 2019-04-23 NOTE — Progress Notes (Signed)
Patient ID: Jay Chen, male   DOB: 12-26-1965, 53 y.o.   MRN: [id number] No real improvement.  Wound has healed well

## 2019-04-23 NOTE — Progress Notes (Signed)
eLink Physician-Brief Progress Note Patient Name: Jay Chen DOB: 01/21/1966 MRN: [id number]   Date of Service  04/23/2019  HPI/Events of Note  Multiple issues:  1. AFIB with RVR - Ventricular rate = 139 - 145. BP = 134/98. Currently on Amiodarone PO. 2. Sat = 89% on 100%/P 18 and 3. Fever to 102.7 - Already on Vancomycin and Cefepime.   eICU Interventions  Will order: 1. Amiodarone 150 mg IV over 10 minutes now.  2. BMP and Mg++ level STAT. 3. Apply cooling blanket as ordered.  4. Place A-line.  5. Recruitment maneuver.  6. ABG post A-line placement.     Intervention Category Major Interventions: Arrhythmia - evaluation and management;Hypoxemia - evaluation and management  Theoplis Garciagarcia Cornelia Copa 04/23/2019, 8:02 PM

## 2019-04-23 NOTE — Progress Notes (Signed)
STROKE TEAM PROGRESS NOTE   INTERVAL HISTORY Pt still intubated, now off sedation. Was told by RN that he has intermittent purposeful movement on the left UE, but not following commands, not open eyes. No significant neuro change. However,  Continue to have high grade fever and hypernatremia. Now on D5 and free water. Wife discussed with palliative care, requesting full code.  OBJECTIVE Vitals:   04/23/19 0700 04/23/19 0800 04/23/19 0816 04/23/19 0900  BP: 123/75 116/83 116/83 108/76  Pulse: (!) 59 (!) 36 (!) 118 (!) 111  Resp: (!) 22 (!) 22 (!) 23 (!) 23  Temp: (!) 101.1 F (38.4 C) (!) 101.5 F (38.6 C)  (!) 100.6 F (38.1 C)  TempSrc:      SpO2: 92% 91% 92% 91%  Weight:      Height:        CBC:  Recent Labs  Lab 04/22/19 0607 04/23/19 0532  WBC 17.0* 12.9*  NEUTROABS 13.0* 9.7*  HGB 9.8* 9.7*  HCT 33.4* 32.9*  MCV 102.1* 102.8*  PLT 246 [id number]   Basic Metabolic Panel:  Recent Labs  Lab 04/18/19 0352  04/21/19 0422 04/22/19 0319 04/23/19 0532  NA 142   < > 150* 152* 157*  K 3.5   < > 3.6 3.5 3.4*  CL 106   < > 109  --  116*  CO2 27   < > 30  --  31  GLUCOSE 137*   < > 130*  --  155*  BUN 74*   < > 134*  --  139*  CREATININE 2.68*   < > 2.61*  --  2.55*  CALCIUM 8.3*   < > 8.6*  --  8.6*  MG 2.2  --  2.7*  --   --   PHOS 4.1  --  3.8  --   --    < > = values in this interval not displayed.   Lipid Panel:     Component Value Date/Time   CHOL 116 01/01/2018 0159   TRIG 133 04/23/2019 0532   HDL 24 (L) 01/01/2018 0159   CHOLHDL 4.8 01/01/2018 0159   VLDL 17 01/01/2018 0159   LDLCALC 75 01/01/2018 0159   HgbA1c:  Lab Results  Component Value Date   HGBA1C 5.8 (H) 04/13/2019   Urine Drug Screen: No results found for: LABOPIA, COCAINSCRNUR, LABBENZ, AMPHETMU, THCU, LABBARB  Alcohol Level No results found for: [place] Head Code Stroke Wo Contrast 04/13/2019 IMPRESSION:  49 cc hematoma in the left cerebral white matter as described. White  matter infarct and small lobar micro hemorrhages were seen on a 2019 brain MRI.   Ct Code Stroke Cta Head W/wo Contrast 04/09/2019 IMPRESSION:  1. No evidence of aneurysm, vascular malformation, or vasculitis.  2. There are areas of CTA spot sign which increase changes of hematoma growth.   Mr Brain Wo Contrast 04/15/2019 IMPRESSION:  1. Postoperative changes from interval left temporal craniotomy for hematoma evacuation. Previously seen large left temporal lobe intraparenchymal hemorrhage has largely been evacuated, with residual blood products seen within the resection cavity. Persistent localized edema and regional mass effect with trace 1-2 mm of left-to-right shift.  2. Associated intraventricular extension with small volume hemorrhage throughout the ventricular system. No hydrocephalus or ventricular trapping.  3. Small postoperative extra-axial collection and pneumocephalus overlying the left frontotemporal convexity without significant mass effect.   CT Head WO Contrast  04/15/2019 Unchanged left temporal hematoma and intraventricular clot when compared to prior  MRI. Minimal subarachnoid hemorrhage is seen along the right sylvian fissure, likely redistribution. No midline shift and no ventricular obstruction.   Ct Head Wo Contrast 04/16/2019 Somewhat less air in the area of hemorrhage and in the left frontal regions compared to 1 day prior. Extent of hemorrhage is virtually identical with marginally less hemorrhage seen in the left lateral ventricle compared to 1 day prior. Stable mild surrounding edema in the left temporal region. There is currently slight increased effacement of the left lateral ventricle compared to 1 day prior, best seen on coronal images. There is 2 mm of midline shift to the right. No new brain parenchymal lesion evident. Foci of paranasal sinus disease and mastoid disease noted.   Dg Chest Port 1 View 04/21/2019 Tube and catheter positions as described without  pneumothorax. It may be prudent to consider advancing endotracheal tube 3-4 cm. Mild right base atelectasis. No edema or consolidation. Stable cardiac enlargement. 04/19/2019 1. Mild stable cardiomegaly with findings suggesting mild interstitial edema. Hazy density right base with slight interval improvement likely part of the interstitial edema although infection is possible.  04/17/2019 1. Increased right basilar opacities. Unchanged small left pleural effusion. 2. Slight retraction of endotracheal tube. Otherwise unchanged support apparatus. 04/16/2019 1. Somewhat high positioning of the endotracheal tube, approximately 8 cm from the carina. Could be advanced 3 cm to the mid trachea. 2. Right IJ catheter in the mid SVC. 3. Satisfactory positioning of the transesophageal tube. 4. Unchanged dense retrocardiac opacity, likely a combination of pleural effusion and airspace disease. 5. Improving atelectasis and interstitial opacity elsewhere.  03/25/2019 1. New right IJ central venous catheter with the tip overlying the mid SVC.  2. No evidence of pneumothorax or other complication.  3. Overall improved aeration with decreased right basilar atelectasis and decreased interstitial edema versus vascular crowding.  4. Persistent dense left basilar airspace opacity.  04/20/2019 Endotracheal tube tip 3.7 cm above the carina. Low volume film with cardiomegaly, vascular congestion and interstitial pulmonary edema. Bibasilar collapse/consolidation, left greater than right.   Transthoracic Echocardiogram  04/03/2019  1. Left ventricular ejection fraction, by visual estimation, is 55 to 60%. The left ventricle has normal function. There is severely increased left ventricular hypertrophy.  2. Elevated left atrial pressure.  3. Left ventricular diastolic parameters are consistent with Grade II diastolic dysfunction (pseudonormalization).  4. The left ventricle has no regional wall motion abnormalities.  5.  Global right ventricle has normal systolic function.The right ventricular size is normal. No increase in right ventricular wall thickness.  6. Left atrial size was mildly dilated.  7. Right atrial size was normal.  8. Small pericardial effusion.  9. The pericardial effusion is circumferential. 10. The mitral valve is normal in structure. No evidence of mitral valve regurgitation. 11. The tricuspid valve is normal in structure. Tricuspid valve regurgitation is not demonstrated. 12. The aortic valve is normal in structure. Aortic valve regurgitation is trivial. 13. The pulmonic valve was normal in structure. Pulmonic valve regurgitation is not visualized. 14. Aortic dilatation noted. 15. There is mild dilatation of the aortic root measuring 42 mm. 16. TR signal is inadequate for assessing pulmonary artery systolic pressure. 17. The inferior vena cava is dilated in size with <50% respiratory variability, suggesting right atrial pressure of 15 mmHg.  Transthoracic Echocardiogram  04/17/2019 By CCM. LVH with normal LV size and function. RV mildly dilated with mildly reduced function. No septal dyssynchrony. No significant valvular lesions. Indeterminate diastolic function due AF, filling pressures appear normal.  Unable to calculate RVSP but PAP likely normal by PVAT. PA seen to bifurcation with no dilatation. IVC not visualized. TDS.  ECG - SR rate 61 BPM. (See cardiology reading for complete details)  EEG 04/16/2019 This study is suggestive of cortical dysfunction in left frontotemporal region likely secondary to underlying ICH.  Additionally there is evidence of severe diffuse encephalopathy, nonspecific etiology but could be secondary to sedated state.  No seizures or epileptiform discharges were seen throughout the recording.  LT EEG 04/17/2019 This study issuggestive of cortical dysfunction in left frontotemporal region likely secondary to underlying ICH.Additionally there is evidence of  severe diffuse encephalopathy, nonspecific etiology but could be secondary to sedated state. No seizures or epileptiform discharges were seen throughout the recording.   PHYSICAL EXAM  Temp:  [98.6 F (37 C)-102.7 F (39.3 C)] 100.6 F (38.1 C) (11/30 0900) Pulse Rate:  [25-118] 111 (11/30 0900) Resp:  [15-30] 23 (11/30 0900) BP: (83-132)/(54-104) 108/76 (11/30 0900) SpO2:  [90 %-100 %] 91 % (11/30 0900) FiO2 (%):  [80 %-90 %] 90 % (11/30 0817)  General - Well nourished, well developed, intubated off sedation.  Ophthalmologic - fundi not visualized due to noncooperation.  Cardiovascular - irregularly irregular heart rate and rhythm, afib with RVR.  Neuro - intubated off sedation, eyes closed, not following commands. With forced eye opening, eyes in mid position, not blinking to visual threat, doll's eyes absent, not tracking, bilateral pupil small 3.5 mm, reactive to light. Corneal reflex weak bilaterally, gag and cough present. Breathing over the vent.  Facial symmetry not able to test due to ET tube.  Tongue protrusion not cooperative. On pain stimulation, mild withdraw on the LUE, but no movement on other extremities. DTR 1+ and left babinski. Sensation, coordination and gait not tested.   ASSESSMENT/PLAN Mr. JOCSAN RAUP is a 53 y.o. male with history of small left temporal lobe stroke in 2019, old microhemorrhages bilaterally on prior brain MRI, morbid obesity, a-fib with RVR on Xarelto, CKD, systolic heart failure with prior EF of 20 to 25% and most recent echo with EF of 50-55% in June, hypertension, diabetes, hyperlipidemia, thoracic aortic aneurysm and OSA, presenting with acute onset of right hemiplegia and dysphasia. He did not receive IV t-PA due to [place].  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   CTA Head and neck - No  evidence of aneurysm, AVM, or vasculitis. There are areas of CTA spot sign which increase changes of hematoma growth.   MRI head - Postoperative changes from interval left temporal craniotomy for hematoma evacuation. trace 1-2 mm of left-to-right shift. Associated intraventricular extension with small volume hemorrhage throughout the ventricular system.  Repeat CT - improved air in the left frontal region but stable hemorrhage and mild surrounding edema  CT repeat pending  2D Echo - EF 60 - 65%.  EEG, LT EEG no sz, L temporal lobe cortical dysfunction  [employer] Virus 2 - negative  LDL - 75  HgbA1c - 5.8  VTE prophylaxis - heparin subq  Xarelto (rivaroxaban) daily prior to admission, now on No antithrombotic given hemorrhage   Therapy recommendations:  Pending  Disposition:  Pending  palliative care discussed with wfie - continue full aggressive care. Family hopeful for improvement  Acute Hypoxic Respiratory Failure w/ refractory hypoxia Volume Overload / Atelectasis / Aspiration in setting of obesity Possible  pneumonia  Intubated and sedated  CCM onboard  Surgery consulted for trach placement - plan to place once vent requirements > 60% FiO2 and < 10 PEEP. Plan to place PEG at the same time. Not currently meeting goals. Plan to follow weekly.  On cefepime and vanco  CCM managing weaning/ventilator strategies  Fever / Leukocytosis / sepsis  Temp - 100.8->100->99.5->102.7->101.5  Leukocytosis - 10.5->12.1->17.2->17.0->12.9  CXR - 11/28 - mild right base atelectasis  Now on cefepime and vanco  Blood cultures - 11/23 - Staph Coag Neg - left hand - possible contaminant - left forearm no growth x 4 days  Respiratory cultures - 11/25 - rare Gram Positive Cocci in pairs - final - CCM onboard  Cooling blanket  Cerebral edema and regional mass effect   Mannitol - 25 g x 1 - 04/12/2019  Temporal Craniotomy - Dr Christella Noa - 03/26/2019  Seizure prophylaxis ->  Keppra  Repeat CT Head 04/16/19 - improved air in the left frontal region but stable hemorrhage and mild surrounding edema with slight increase effacement of left lateral ventricle with 2 mm shift.    Hypertonic therapy if edema worsens - not felt to be indicated at this time  CT repeat pending  Atrial Fibrillation w/ RVR  Home anticoagulation:  Xarelto (rivaroxaban) daily   No AC given ICH  Rate controlled  On amiodarone and metoprolol 50  Hypertensive Emergency  BP on admission 194/94  Home BP meds: Hydralazine ; Cozaar ; Metoprolol  Treated with Cleviprex, now off    Current BP meds: metoprolol 50 bid  Stable  . SBP goal < 160 mmHg . Prn labetalol   Hyperlipidemia  Home Lipid lowering medication: Lipitor 80 mg daily  LDL 75, goal < 70  Current lipid lowering medication: hold statin given ICH  Continue statin at discharge  Dysphagia . Secondary to stroke . NPO . cortrak placed 11/24, on tube feedings . Speech on board  Surgery consulted for trach placement - plan to place once vent requirements > 60% FiO2 and < 10 PEEP. Plan to place PEG at the same time. No currently meeting goals. Plan to follow weekly.   Other Stroke Risk Factors  Former cigarette smoker - quit  Previous ETOH use  Morbid Obesity, Body mass index is 52.92 kg/m., recommend weight loss, diet and exercise as appropriate   Hx stroke/TIA 2019  Obstructive sleep apnea, not on CPAP at home  H/O diastolic Congestive Heart Failure.   Other Active Problems  Difficult foley placement -> Dr Junious Silk - leave foley in place  AKI on CKD - creatinine baseline 1.7 - now 2.85->2.69->2.61->2.55  Hypokalemia K - 3.5->3.6->3.6->3.5->3.4  Anemia - Hb - 10.0->9.8->9.7   Hypernatremia - 142->151->150->152->157  Diarrhea, C Diff neg   Hospital day # 9   Patient remains critically ill due to respiratory failure from aspiration pneumonia and underlying CHF and COPD.  He is requiring sedation  to help ventilate him. This patient is critically ill and at significant risk of neurological worsening, death and care requires constant monitoring of vital signs, hemodynamics,respiratory and cardiac monitoring, extensive review of multiple databases, frequent neurological assessment, discussion with family, other specialists and medical decision making of high complexity. I spent 35 minutes of neurocritical care time  in the care of  this patient.  Rosalin Hawking, MD PhD Stroke Neurology 04/23/2019 10:34 AM  To contact Stroke Continuity provider, please refer to [email or web address] After hours, contact General Neurology

## 2019-04-23 NOTE — Progress Notes (Addendum)
From a pulmonary standpoint, has clinically worsened since last eval and is on 90% and 18 of PEEP. GIven his prolonged high ventilator settings, low likelihood of rapid recovery and would expect progression to fibrotic lung disease. Discussed with wife last week that we will follow peripherally until his vent requirements are less than 60% FiO2 and less than 10 of PEEP. Will eval weekly, please call if aggressive clinical improvements are made between assessments and he meets these criteria.   Jesusita Oka, MD General and [facility]

## 2019-04-24 ENCOUNTER — Inpatient Hospital Stay (HOSPITAL_COMMUNITY): Payer: BC Managed Care – PPO

## 2019-04-24 DIAGNOSIS — E46 Unspecified protein-calorie malnutrition: Secondary | ICD-10-CM | POA: Diagnosis present

## 2019-04-24 DIAGNOSIS — A419 Sepsis, unspecified organism: Secondary | ICD-10-CM | POA: Diagnosis not present

## 2019-04-24 DIAGNOSIS — I69191 Dysphagia following nontraumatic intracerebral hemorrhage: Secondary | ICD-10-CM

## 2019-04-24 DIAGNOSIS — D689 Coagulation defect, unspecified: Secondary | ICD-10-CM | POA: Diagnosis present

## 2019-04-24 LAB — CBC WITH DIFFERENTIAL/PLATELET
Abs Immature Granulocytes: 0.87 10*3/uL — ABNORMAL HIGH (ref 0.00–0.07)
Basophils Absolute: 0.1 10*3/uL (ref 0.0–0.1)
Basophils Relative: 1 %
Eosinophils Absolute: 0.1 10*3/uL (ref 0.0–0.5)
Eosinophils Relative: 0 %
HCT: 36.9 % — ABNORMAL LOW (ref 39.0–52.0)
Hemoglobin: 11.3 g/dL — ABNORMAL LOW (ref 13.0–17.0)
Immature Granulocytes: 5 %
Lymphocytes Relative: 9 %
Lymphs Abs: 1.8 10*3/uL (ref 0.7–4.0)
MCH: 30.8 pg (ref 26.0–34.0)
MCHC: 30.6 g/dL (ref 30.0–36.0)
MCV: 100.5 fL — ABNORMAL HIGH (ref 80.0–100.0)
Monocytes Absolute: 1.5 10*3/uL — ABNORMAL HIGH (ref 0.1–1.0)
Monocytes Relative: 8 %
Neutro Abs: 14.7 10*3/uL — ABNORMAL HIGH (ref 1.7–7.7)
Neutrophils Relative %: 77 %
Platelets: 281 10*3/uL (ref 150–400)
RBC: 3.67 MIL/uL — ABNORMAL LOW (ref 4.22–5.81)
RDW: 14.5 % (ref 11.5–15.5)
WBC: 19 10*3/uL — ABNORMAL HIGH (ref 4.0–10.5)
nRBC: 0.2 % (ref 0.0–0.2)

## 2019-04-24 LAB — BASIC METABOLIC PANEL
Anion gap: 11 (ref 5–15)
BUN: 143 mg/dL — ABNORMAL HIGH (ref 6–20)
CO2: 26 mmol/L (ref 22–32)
Calcium: 8.6 mg/dL — ABNORMAL LOW (ref 8.9–10.3)
Chloride: 117 mmol/L — ABNORMAL HIGH (ref 98–111)
Creatinine, Ser: 2.87 mg/dL — ABNORMAL HIGH (ref 0.61–1.24)
GFR calc Af Amer: 27 mL/min — ABNORMAL LOW (ref >60)
GFR calc non Af Amer: 24 mL/min — ABNORMAL LOW (ref >60)
Glucose, Bld: 225 mg/dL — ABNORMAL HIGH (ref 70–99)
Potassium: 3.8 mmol/L (ref 3.5–5.1)
Sodium: 154 mmol/L — ABNORMAL HIGH (ref 135–145)

## 2019-04-24 LAB — MAGNESIUM: Magnesium: 2.4 mg/dL (ref 1.7–2.4)

## 2019-04-24 MED ORDER — PHENYLEPHRINE HCL-NACL 40-0.9 MG/250ML-% IV SOLN
0.0000 ug/min | INTRAVENOUS | Status: DC
  Filled 2019-04-24: qty 250

## 2019-04-24 MED ORDER — PHENYLEPHRINE HCL-NACL 10-0.9 MG/250ML-% IV SOLN
INTRAVENOUS | Status: AC
  Administered 2019-04-24: 10 mg
  Filled 2019-04-24: qty 250

## 2019-04-24 DEATH — deceased

## 2019-04-26 LAB — CULTURE, BLOOD (ROUTINE X 2)
Special Requests: ADEQUATE
Special Requests: ADEQUATE

## 2019-05-25 NOTE — Death Summary Note (Addendum)
Stroke Discharge Summary  Patient ID: Jay Chen   MRN: [id number]      DOB: 04/24/1966  Date of Admission: 04/20/2019 Date of Discharge: 05/18/2019  Attending Physician:  Rosalin Hawking, MD, Stroke MD Consultant(s):   Ashok Pall, MD (neurosyurgery), Jennet Maduro, MD (pulmonary/intensive care), Alferd Apa, [place] for Reather Laurence, MD (general surgery), Kathie Rhodes, MD (urology) and Wadie Lessen, NP (palliative care) Patient's PCP:  [name], MD  DISCHARGE DIAGNOSIS:  Principal Problem:   ICH (intracerebral hemorrhage) ([place]) Active Problems:   Acute on chronic respiratory failure with hypoxia ([place])   Obstructive sleep apnea   HTN (hypertension)   DNR (do not resuscitate) discussion   Hyperlipidemia with target LDL less than 100   DCM (dilated cardiomyopathy) ([place])   Aspiration pneumonia ([place])   Morbid obesity ([place])   Diabetes mellitus with neuropathy ([place])   CKD (chronic kidney disease) stage 4, GFR 15-29 ml/min (HCC)   Chronic combined systolic and diastolic congestive heart failure (HCC)   PAF (paroxysmal atrial fibrillation) ([place])   Endotracheally intubated   Cytotoxic brain edema ([place])   Brain herniation ([place])   Palliative care by specialist   Pressure injury of skin   Sepsis ([place])   Protein-calorie malnutrition ([place])   Dysphagia following nontraumatic intracerebral hemorrhage   Coagulopathy ([place])   Past Medical History:  Diagnosis Date  . CKD (chronic kidney disease), stage III   . Congestive heart failure [facility]) May of 2011   Felt to have cor pulmonale; EF 45 to 50% from echo in May 2011  . Cor pulmonale (chronic) ([place])   . Gallstones   . Gout    "take daily RX" (02/15/2018)  . History of kidney stones   . Hyperlipidemia   . Hypertension   . Morbid obesity ([place])   . Persistent atrial fibrillation ([place])    [name] 12/28/2017  . Sleep apnea    "dx'd; couldn't tolerate CPAP" (02/15/2018)  . Thoracic aneurysm    a. 4.8cm thoracic aortic aneurysm  by CT 2013.  [name] Thoracic aortic aneurysm ([place])    known/notes 12/28/2017  . Type II diabetes mellitus ([place])    Past Surgical History:  Procedure Laterality Date  . CARDIOVERSION N/A 02/22/2018   Procedure: CARDIOVERSION;  Surgeon: Jolaine Artist, MD;  Location: [facility] ENDOSCOPY;  Service: Cardiovascular;  Laterality: N/A;  . CARDIOVERSION N/A 03/01/2018   Procedure: CARDIOVERSION;  Surgeon: Jolaine Artist, MD;  Location: [facility] ENDOSCOPY;  Service: Cardiovascular;  Laterality: N/A;  . CRANIOTOMY Left 04/05/2019   Procedure: TEMPORAL CRANIOTOMY FOR HEMATOMA;  Surgeon: Ashok Pall, MD;  Location: [place];  Service: Neurosurgery;  Laterality: Left;  . LAPAROSCOPIC CHOLECYSTECTOMY  2000  . RIGHT/LEFT HEART CATH AND CORONARY ANGIOGRAPHY N/A 02/20/2018   Procedure: RIGHT/LEFT HEART CATH AND CORONARY ANGIOGRAPHY;  Surgeon: Jolaine Artist, MD;  Location: [place] CV LAB;  Service: Cardiovascular;  Laterality: N/A;  . US ECHOCARDIOGRAPHY  09/21/2009   EF 45-50%; Cavity size was severely dilated, severe concentric hypertrophy and normal wall motion   No Known Allergies   LABORATORY STUDIES CBC    Component Value Date/Time   WBC 19.0 (H) 05-18-2019 0400   RBC 3.67 (L) 05-18-19 0400   HGB 11.3 (L) 05-18-19 0400   HCT 36.9 (L) May 18, 2019 0400   PLT 281 May 18, 2019 0400   MCV 100.5 (H) 05/18/2019 0400   MCH 30.8 2019-05-18 0400   MCHC 30.6 2019/05/18 0400   RDW 14.5 May 18, 2019 0400   LYMPHSABS  1.8 2019-05-24 0400   MONOABS 1.5 (H) May 24, 2019 0400   EOSABS 0.1 24-May-2019 0400   BASOSABS 0.1 2019-05-24 0400   CMP    Component Value Date/Time   NA 154 (H) 24-May-2019 0400   NA 143 01/25/2018 1423   K 3.8 2019/05/24 0400   CL 117 (H) May 24, 2019 0400   CO2 26 May 24, 2019 0400   GLUCOSE 225 (H) 05/24/19 0400   BUN 143 (H) 24-May-2019 0400   BUN 32 (H) 01/25/2018 1423   CREATININE 2.87 (H) 05/24/2019 0400   CALCIUM 8.6 (L) May 24, 2019 0400   PROT 6.4 (L) 04/13/2019 0823   ALBUMIN  3.3 (L) 04/19/2019 0823   AST 21 04/09/2019 0823   ALT 21 03/25/2019 0823   ALKPHOS 89 03/25/2019 0823   BILITOT 0.7 04/08/2019 0823   GFRNONAA 24 (L) 05-24-2019 0400   GFRAA 27 (L) 05/24/19 0400   COAGS Lab Results  Component Value Date   INR 1.8 (H) 04/23/2019   INR 1.01 09/19/2009   Lipid Panel    Component Value Date/Time   CHOL 116 01/01/2018 0159   TRIG 133 04/23/2019 0532   HDL 24 (L) 01/01/2018 0159   CHOLHDL 4.8 01/01/2018 0159   VLDL 17 01/01/2018 0159   LDLCALC 75 01/01/2018 0159   HgbA1C  Lab Results  Component Value Date   HGBA1C 5.8 (H) 04/16/2019   Urinalysis    Component Value Date/Time   COLORURINE YELLOW 09/20/2018 0945   APPEARANCEUR CLEAR 09/20/2018 0945   LABSPEC 1.010 09/20/2018 0945   PHURINE 7.5 09/20/2018 0945   GLUCOSEU NEGATIVE 09/20/2018 0945   HGBUR NEGATIVE 09/20/2018 0945   BILIRUBINUR NEGATIVE 09/20/2018 0945   KETONESUR NEGATIVE 09/20/2018 0945   PROTEINUR NEGATIVE 03/24/2018 1926   UROBILINOGEN 1.0 09/20/2018 0945   NITRITE NEGATIVE 09/20/2018 0945   LEUKOCYTESUR NEGATIVE 09/20/2018 0945   Urine Drug Screen No results found for: LABOPIA, COCAINSCRNUR, LABBENZ, AMPHETMU, THCU, LABBARB  Alcohol Level No results found for: [place] STUDIES Ct Head Code Stroke Wo Contrast 04/05/2019 IMPRESSION:  49 cc hematoma in the left cerebral white matter as described. White matter infarct and small lobar micro hemorrhages were seen on a 2019 brain MRI.   Ct Code Stroke Cta Head W/wo Contrast 04/12/2019 IMPRESSION:  1. No evidence of aneurysm, vascular malformation, or vasculitis.  2. There are areas of CTA spot sign which increase changes of hematoma growth.   Mr Brain Wo Contrast 04/15/2019 IMPRESSION:  1. Postoperative changes from interval left temporal craniotomy for hematoma evacuation. Previously seen large left temporal lobe intraparenchymal hemorrhage has largely been evacuated, with residual blood  products seen within the resection cavity. Persistent localized edema and regional mass effect with trace 1-2 mm of left-to-right shift.  2. Associated intraventricular extension with small volume hemorrhage throughout the ventricular system. No hydrocephalus or ventricular trapping.  3. Small postoperative extra-axial collection and pneumocephalus overlying the left frontotemporal convexity without significant mass effect.   CT Head WO Contrast  04/15/2019 Unchanged left temporal hematoma and intraventricular clot when compared to prior MRI. Minimal subarachnoid hemorrhage is seen along the right sylvian fissure, likely redistribution. No midline shift and no ventricular obstruction.   Ct Head Wo Contrast 04/16/2019 Somewhat less air in the area of hemorrhage and in the left frontal regions compared to 1 day prior. Extent of hemorrhage is virtually identical with marginally less hemorrhage seen in the left lateral ventricle compared to 1 day prior. Stable mild surrounding edema in the left temporal region.  There is currently slight increased effacement of the left lateral ventricle compared to 1 day prior, best seen on coronal images. There is 2 mm of midline shift to the right. No new brain parenchymal lesion evident. Foci of paranasal sinus disease and mastoid disease noted.   Dg Chest Port 1 View May 20, 2019 Diffuse bilateral airspace disease, worsening on the right since prior study. Cardiomegaly. 04/23/2019 1. New extensive bilateral pulmonary infiltrates with dense consolidation in the left mid and upper lung zone. Possibility of  ARDS should be considered. 2. Endotracheal tube and central venous catheter are in good Position. 04/21/2019 Tube and catheter positions as described without pneumothorax. It may be prudent to consider advancing endotracheal tube 3-4 cm. Mild right base atelectasis. No edema or consolidation. Stable cardiac enlargement. 04/19/2019 1. Mild stable cardiomegaly  with findings suggesting mild interstitial edema. Hazy density right base with slight interval improvement likely part of the interstitial edema although infection is possible.  04/17/2019 1. Increased right basilar opacities. Unchanged small left pleural effusion. 2. Slight retraction of endotracheal tube. Otherwise unchanged support apparatus. 04/16/2019 1. Somewhat high positioning of the endotracheal tube, approximately 8 cm from the carina. Could be advanced 3 cm to the mid trachea. 2. Right IJ catheter in the mid SVC. 3. Satisfactory positioning of the transesophageal tube. 4. Unchanged dense retrocardiac opacity, likely a combination of pleural effusion and airspace disease. 5. Improving atelectasis and interstitial opacity elsewhere.  04/01/2019 1. New right IJ central venous catheter with the tip overlying the mid SVC.  2. No evidence of pneumothorax or other complication.  3. Overall improved aeration with decreased right basilar atelectasis and decreased interstitial edema versus vascular crowding.  4. Persistent dense left basilar airspace opacity.  04/06/2019 Endotracheal tube tip 3.7 cm above the carina. Low volume film with cardiomegaly, vascular congestion and interstitial pulmonary edema. Bibasilar collapse/consolidation, left greater than right.   Transthoracic Echocardiogram  04/15/2019  1. Left ventricular ejection fraction, by visual estimation, is 55 to 60%. The left ventricle has normal function. There is severely increased left ventricular hypertrophy.  2. Elevated left atrial pressure.  3. Left ventricular diastolic parameters are consistent with Grade II diastolic dysfunction (pseudonormalization).  4. The left ventricle has no regional wall motion abnormalities.  5. Global right ventricle has normal systolic function.The right ventricular size is normal. No increase in right ventricular wall thickness.  6. Left atrial size was mildly dilated.  7. Right atrial size was  normal.  8. Small pericardial effusion.  9. The pericardial effusion is circumferential. 10. The mitral valve is normal in structure. No evidence of mitral valve regurgitation. 11. The tricuspid valve is normal in structure. Tricuspid valve regurgitation is not demonstrated. 12. The aortic valve is normal in structure. Aortic valve regurgitation is trivial. 13. The pulmonic valve was normal in structure. Pulmonic valve regurgitation is not visualized. 14. Aortic dilatation noted. 15. There is mild dilatation of the aortic root measuring 42 mm. 16. TR signal is inadequate for assessing pulmonary artery systolic pressure. 17. The inferior vena cava is dilated in size with <50% respiratory variability, suggesting right atrial pressure of 15 mmHg.  Transthoracic Echocardiogram  04/17/2019 By CCM. LVH with normal LV size and function. RV mildly dilated with mildly reduced function. No septal dyssynchrony. No significant valvular lesions. Indeterminate diastolic function due AF, filling pressures appear normal. Unable to calculate RVSP but PAP likely normal by PVAT. PA seen to bifurcation with no dilatation. IVC not visualized. TDS.  ECG - SR rate 61  BPM. (See cardiology reading for complete details)  EEG 04/16/2019 This study issuggestive of cortical dysfunction in left frontotemporal region likely secondary to underlying ICH.Additionally there is evidence of severe diffuse encephalopathy, nonspecific etiology but could be secondary to sedated state. No seizures or epileptiform discharges were seen throughout the recording.  LT EEG 04/17/2019 This study issuggestive of cortical dysfunction in left frontotemporal region likely secondary to underlying ICH.Additionally there is evidence of severe diffuse encephalopathy, nonspecific etiology but could be secondary to sedated state. No seizures or epileptiform discharges were seen throughout the recording.     HISTORY OF PRESENT  ILLNESS KURTISS COOVERT is an 54 y.o. male with prior history of small left temporal lobe stroke in 08-22-2017, old bilateral microhemorrhages on prior brain MRI, morbid obesity, a-fib with RVR on Xarelto, CKD, systolic heart failure with prior EF of 20 to 25% and most recent echo with EF of 50-55% in June, hypertension, diabetes, hyperlipidemia, thoracic aortic aneurysm and OSA, presenting with acute onset of right hemiplegia and dysphasia. LKW was 0715 03/27/2019. He was seen normal by family, walking, then suddenly collapsed and was unable to get up, with right sided weakness. Family called EMS who noted the same deficits on arrival. CBG en route was 146. On arrival to the ED the patient had sonorous respirations and decreased level of consciousness. He could follow commands but had difficulty verbalizing with dysarthric, fragmentary speech. CT head reveals a large left temporal lobe acute hemorrhage.    HOSPITAL COURSE Mr. NAVRAJ SAMPAYO is a 54 y.o. male with history of small left temporal lobe stroke in 22-Aug-2017, old microhemorrhages bilaterally on prior brain MRI, morbid obesity, a-fib with RVR on Xarelto, CKD, systolic heart failure withpriorEF of 20 to 25%and most recent echo with EF of 50-55% in June, hypertension, diabetes, hyperlipidemia, thoracic aortic aneurysmandOSA, presenting with acute onset of right hemiplegia and dysphasia. He did not receive IV t-PA due to [place].  ICH: hypertensive large left temporal lobe ICH due to xarelto associated coagulapathy s/p Andexxa reversal followed by Craniotomy  CT head - hematoma in the left cerebral white matter as described.   CTA Head and neck - No evidence of aneurysm, AVM, or vasculitis. There are areas of CTA spot sign which increase changes of hematoma growth.   MRI head - Postoperative changes from interval left temporal craniotomy for hematoma evacuation. trace 1-2 mm of left-to-right shift. Associated intraventricular extension with small volume  hemorrhage throughout the ventricular system.  Repeat CT - improved air in the left frontal region but stable hemorrhage and mild surrounding edema  2D Echo - EF 60 - 65%.  EEG, LT EEG no sz, L temporal lobe cortical dysfunction  [employer] Virus 2 - negative  LDL - 75  HgbA1c - 5.8  Xarelto (rivaroxaban) daily prior to admission, now on No antithrombotic given hemorrhage   Stroke large with poor neuro decline in setting of multiple medical issues, most notably respiratory, with inability to optimize vent settings.  Palliative care consulted and discussed with wfie - decision to continue full aggressive care with family hopeful for improvement. Patient continued with respiratory decline. Patient was DNR but died prior to  comfort care.  Death:  May 01, 2019 at 0723  Acute Hypoxic Respiratory Failure w/ refractory hypoxia Volume Overload / Atelectasis / Aspiration in setting of obesity Possible pneumonia  Intubated and sedated  CCM onboard  Surgery consulted for trach placement - planned to place once vent requirements > 60% FiO2 and < 10 PEEP.  Planned to place PEG at the same time. Not currently meeting goals. Planned to follow weekly.  Treated with cefepime and vanco  Progressive hypoxemia 2019-05-03 with sats in low 80s. Could not tolerate transition to APRV. Low BP would not support paralytic.   CCM managing weaning/ventilator strategies  Fever / Leukocytosis / sepsis  Temp - 100.8->100->99.5->102.7->101.7  Leukocytosis - 10.5->12.1->17.2->17.0->12.9->19.0  CXR - 11/28 - mild right base atelectasis  Treated with cefepime and vanco  Blood cultures - 11/23 - Staph Coag Neg - left hand - possible contaminant - left forearm no growth x 4 days  Respiratory cultures - 11/25 - rare Gram Positive Cocci in pairs - final - CCM consulted  Cooling blanket  Cerebral edema and regional mass effect   Mannitol - 25 g x 1 - 04/01/2019  Temporal Craniotomy - Dr Christella Noa -  03/27/2019  Seizure prophylaxis -> Keppra  Repeat CT Head 04/16/19 - improved air in the left frontal region but stable hemorrhage and mild surrounding edema with slight increase effacement of left lateral ventricle with 2 mm shift.    Hypertonic therapy if edema worsens - not felt to be indicated at this time  Atrial Fibrillation w/ RVR  Home anticoagulation:  Xarelto (rivaroxaban) daily   No AC given ICH  Rate controlled  Treated with amiodarone and metoprolol 50  Hypertensive Emergency  BP on admission 194/94  Home BP meds: Hydralazine ; Cozaar ; Metoprolol  Treated with Cleviprex, now off    Current BP meds: metoprolol 50 bid  Stable   SBP goal < 160 mmHg  Prn labetalol   Developed hypotension  Hyperlipidemia  Home Lipid lowering medication: Lipitor 80 mg daily  LDL 75, goal < 70  Current lipid lowering medication: hold statin given ICH  Dysphagia Malnutrition  Secondary to stroke  NPO  cortrak placed 11/24, on tube feedings  Speech consulted  Surgery consulted for trach placement - planned to place once vent requirements > 60% FiO2 and < 10 PEEP. Planned to place PEG at the same time. Not currently meeting goals. Planned to follow weekly.   Other Stroke Risk Factors  Former cigarette smoker - quit  Previous ETOH use  Morbid Obesity, Body mass index is 52.92 kg/m., recommend weight loss, diet and exercise as appropriate   Hx stroke/TIA 2019  Obstructive sleep apnea, not on CPAP at home  H/O diastolic Congestive Heart Failure.   Other Active Problems  Difficult foley placement -> Dr Junious Silk (urology) consulted - leave foley in place  AKI on CKD - creatinine baseline 1.7 - now 2.85->2.69->2.61->2.55->2.87  Hypokalemia K - 3.5->3.6->3.6->3.5->3.4->3.8  Anemia - Hb - 10.0->9.8->9.7->11.3   Hypernatremia - 142->151->150->152->157->154->155->154  Diarrhea, C Diff neg  Stage II pressure ulcer L medial buttocks. Pressure injury R  medial buttocks  DISCHARGE PLAN  Death:  May 03, 2019 [id number]  Disposition:  Morgue. Death certificate signed on May 06, 2019  35 minutes were spent preparing discharge.  Rosalin Hawking, MD PhD Stroke Neurology 05/06/2019 2:18 PM

## 2019-05-25 NOTE — Progress Notes (Signed)
Chaplain visited with the wife of the patient as the chaplain had been with the family throughout the patients stay.  The chaplain offered prayer and supportive conversation.  Brion Aliment Chaplain Resident For questions concerning this note please contact me by pager [phone]

## 2019-05-25 NOTE — Progress Notes (Signed)
At 0723, pt has died. He has no heart sounds and no pulse. He is in asystole on monitor. RT called to remove ETT. Family has been called at 0600 and is on their way.

## 2019-05-25 NOTE — Progress Notes (Signed)
Patient desaturating throughout the night along with tachycardia. Patient has now become hypotensive - started on neo gtt.   Wife came to hospital to make decisions on goals of care, ultimately deciding to make him a DNR and to be comfortable. Her daughter came to say her goodbyes but son decided to stay outside. Offered spiritual care.   Wife has decided to make the patient comfort care in the morning (approx 8am).Updated MD.

## 2019-05-25 NOTE — Progress Notes (Signed)
Patient ID: Jay Chen, male   DOB: [birth year], 54 y.o.   MRN: [id number]  This NP visited patient at the bedside as a follow up to  yesterday's [place] and to meet with family as scheduled for ongoing discussion regarding current medical situation and plan of care.  Patient's wife and mother at bedside.  Patient continues to decliene under aggressive medical management.  Dr Wonda Amis updated family  We had continued conversation regarding the patient's complex medical situation.  Family verbalize understanding of the seriousness of the situation and likely poor prognosis.  We discussed the logistics of a shift to a comfort approach focusing on comfort and dignity.  For now continue current medical treatment plan hoping for improvement.  Created space and opportunity for family to explore their thoughts and feelings.  Share their deep love and respect for Mr Baun as a son, husband, father and friend.   Emotional support offered  Wife shares her concern for her two teen children.  She is planning on talking with them tonight and allowing them to make deci sion to come to the hospital or not. Education on available counseling through area hospice offered.  Discussed with family the importance of continued conversation with each and the  medical providers regarding overall plan of care and treatment options,  ensuring decisions are within the context of the patients values and GOCs.  Questions and concerns addressed   Will f/u in the morning with family for ongoing support and conversation.  Total time spent on the unit was 60 minutes  Greater than 50% of the time was spent in counseling and coordination of care  Wadie Lessen NP  Palliative Medicine Team Team Phone # [phone] Pager [phone]

## 2019-05-25 NOTE — Progress Notes (Signed)
eLink Physician-Brief Progress Note Patient Name: Jay Chen DOB: 02-Feb-1966 MRN: [id number]   Date of Service  05-03-19  HPI/Events of Note  Hypotension - BP = 84/50 with MA = 61.  eICU Interventions  Will order: 1. Phenylephrine IV infusion. Titrate to MAP >= 65.     Intervention Category Major Interventions: Hypotension - evaluation and management  Sommer,Steven Eugene 2019/05/03, 1:40 AM

## 2019-05-25 NOTE — Progress Notes (Signed)
PEA on monitor, no pulse felt, no heart tonse heard

## 2019-05-25 NOTE — Progress Notes (Signed)
PCCM INTERVAL PROGRESS NOTE  Called to bedside to evaluate patient in the setting of progressive hypoxemia. Briefly he is a 54 year old male admitted 11/21 with ICH and is now s/p crani. He suffered what I understand to be a relatively devastating injury. Course has now been complicated by ARDS presumed to be in the setting of pneumonia, which is being treated with cefepime/vanco. Tonight he is on PRVC on 100% FiO2 and 18 PEEP remaining with O2 sats in the low 80s. There is little improvement with recruitment maneuvers. Hypoxia confirmed on ABG.  CXR negative for clinically significant pneumothorax, but shows persistent bilateral infiltrates. Awaiting official read. I have discussed his case with Dr. Mariane Masters who has seen the patient as well. I do not believe he will be able to tolerate transition to APRV. Prognosis seems to be very poor.   Plan: - Continue full vent support - Would consider one time dose of paralytic, but BP low and cannot give him sedatives to allow this.  - No pneumothorax on CXR - Wife is on her way in to decide about goals of care.  - Not much else to offer him at this point   Georgann Housekeeper, [facility] for personal pager Call [facility] 7p-7a PCCM on call pager [phone]  04-30-19 1:58 AM

## 2019-05-25 NOTE — Progress Notes (Signed)
eLink Physician-Brief Progress Note Patient Name: Jay Chen DOB: Sep 17, 1965 MRN: [id number]   Date of Service  May 02, 2019  HPI/Events of Note  Hypoxia - Sat = 81% ib 100%/P 18. No response to recruitment maneuvers.   eICU Interventions  Will order: 1. Portable CXR STAT. 2. Will ask ground team to evaluate patient at bedside.      Intervention Category Major Interventions: Hypoxemia - evaluation and management  Husein Guedes Eugene 05-02-19, 1:31 AM

## 2019-05-25 NOTE — Progress Notes (Signed)
Asysytole on monitor

## 2019-05-25 NOTE — Progress Notes (Addendum)
CDS referral number: 05/04/2019-005  Spoke with Jay Chen   Addition 0400- patient is not a candidate for organ donation.

## 2019-05-25 DEATH — deceased

## 2020-04-19 IMAGING — MR MR MRA HEAD W/O CM
1 series · 20 of 48 positions shown · non-contrast
Comparison: Brain MRI yesterday

CLINICAL DATA: Stroke follow-up

EXAM:
MRA HEAD WITHOUT CONTRAST
TECHNIQUE: Angiographic images of the Circle of [place] were obtained using MRA
technique without intravenous contrast.

[Series 5: 3d cow · axial · 0.5mm · 0.45mm/px · z∈[+29,+119]mm · 20 of 188 slices shown]
[im 1/188]
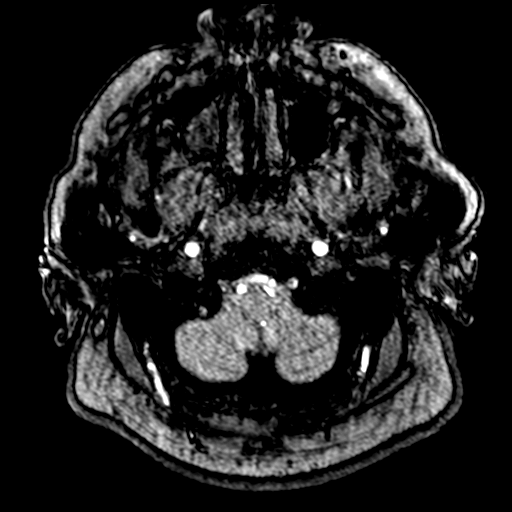
[im 4/188]
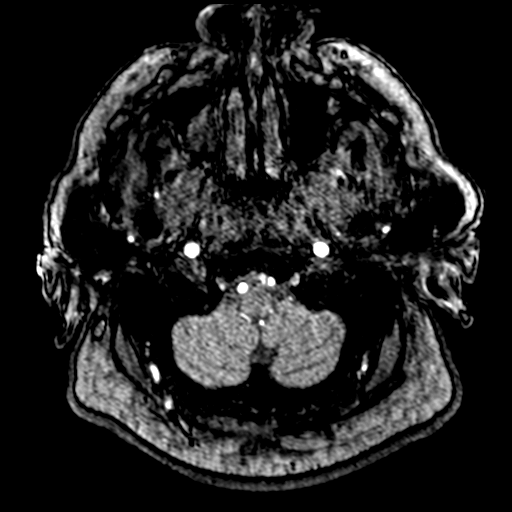
[im 8/188]
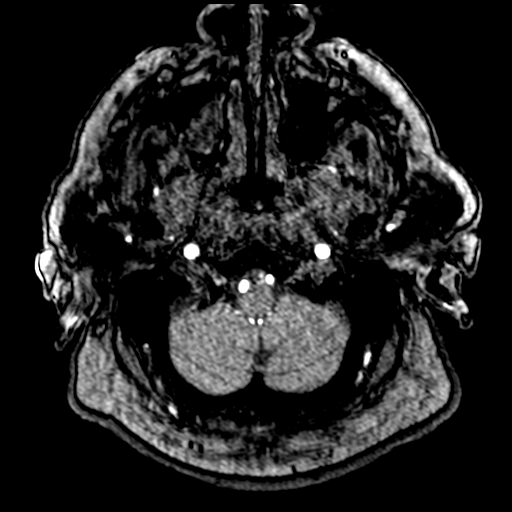
[im 12/188]
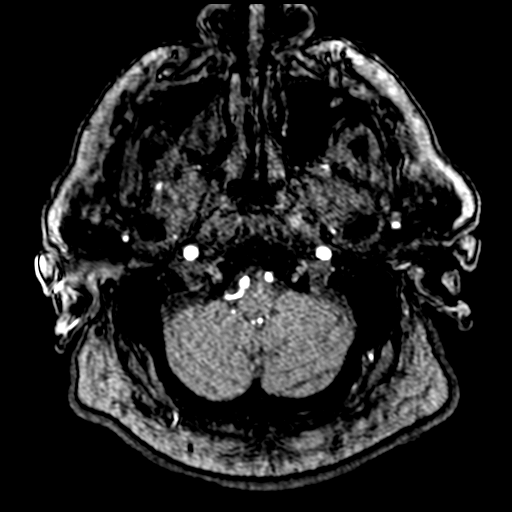
[im 16/188]
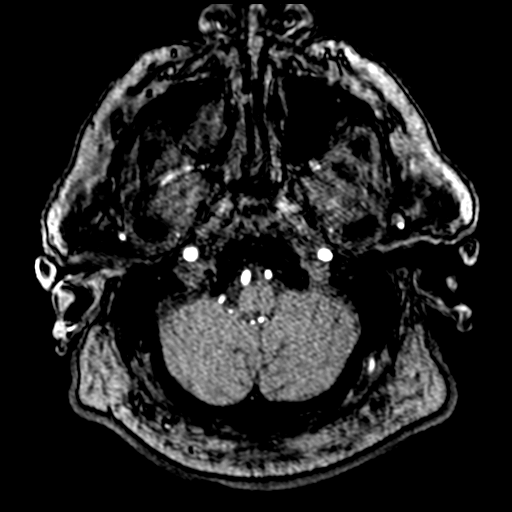
[im 20/188]
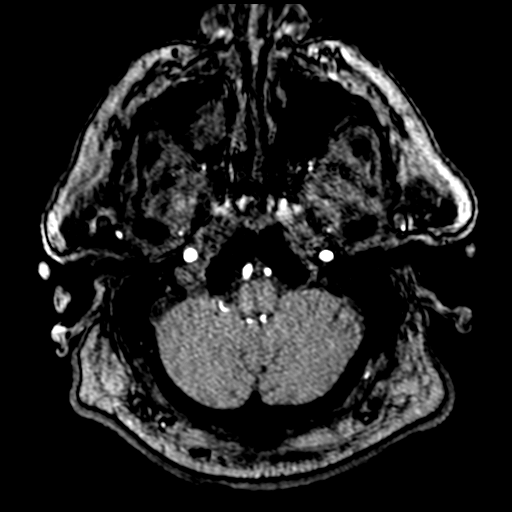
[im 24/188]
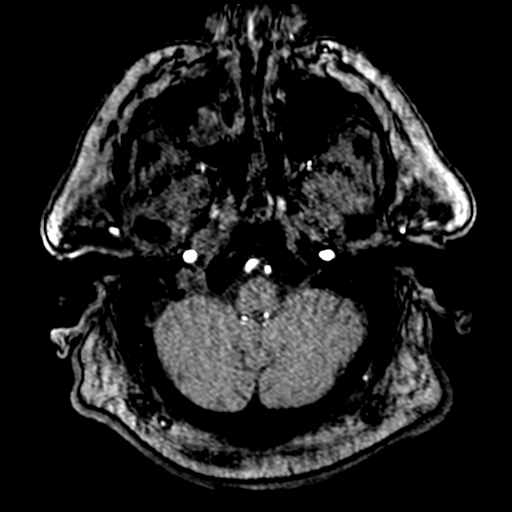
[im 28/188]
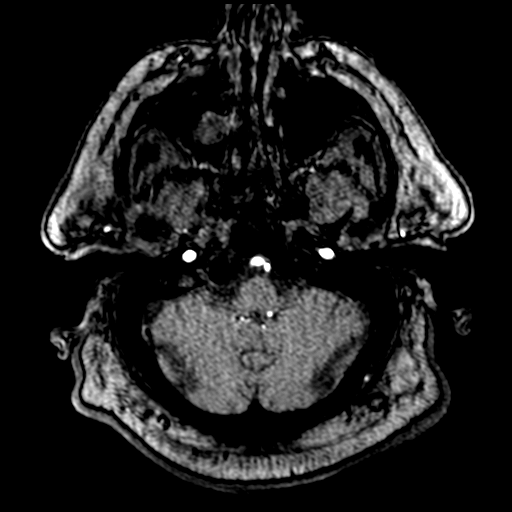
[im 32/188]
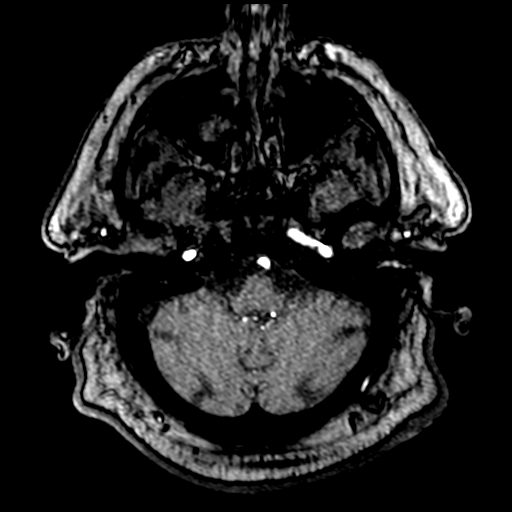
[im 36/188]
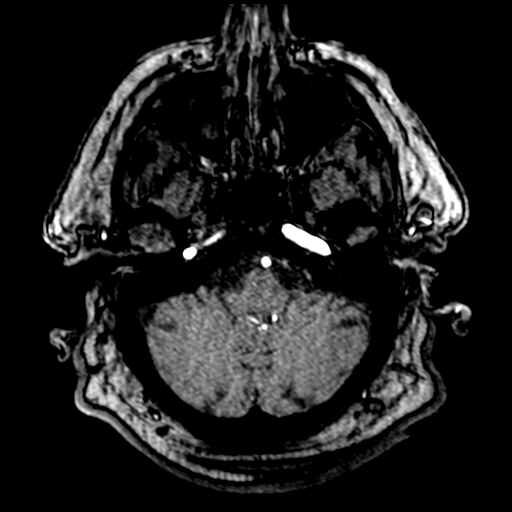
[im 40/188]
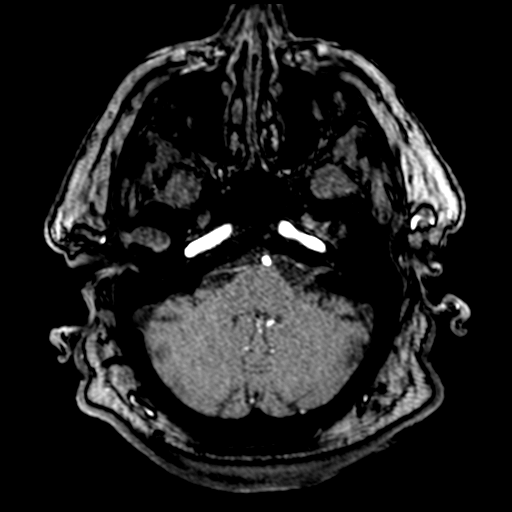
[im 44/188]
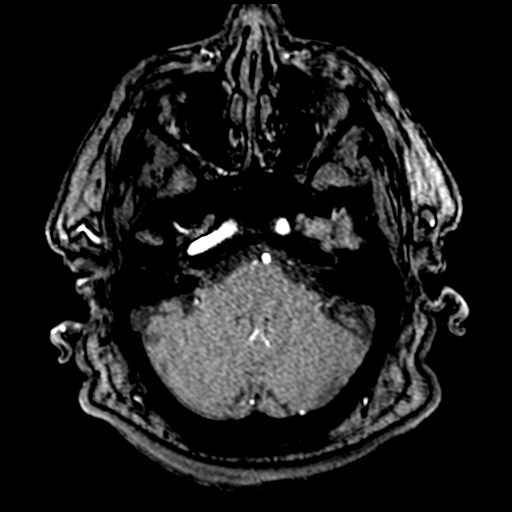
[im 60/188]
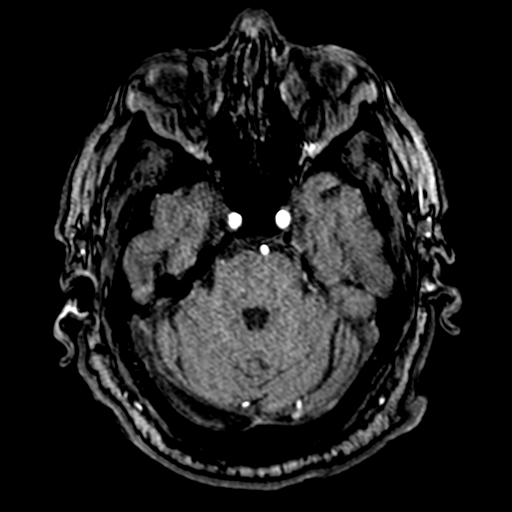
[im 84/188]
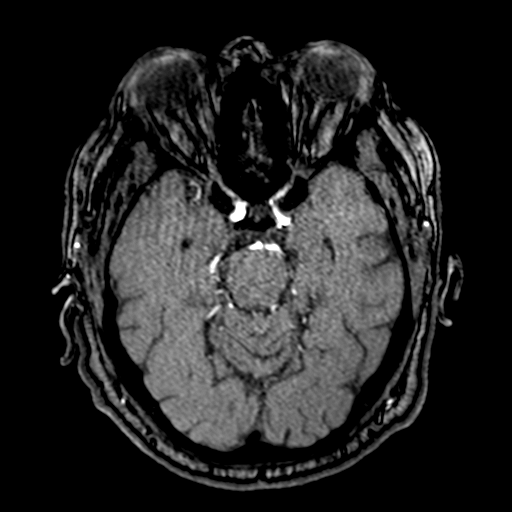
[im 96/188]
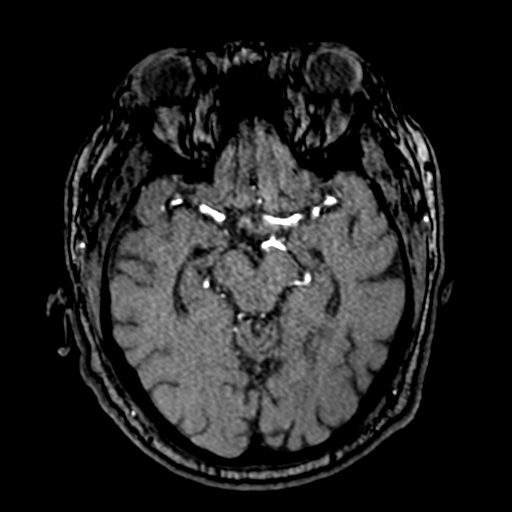
[im 108/188]
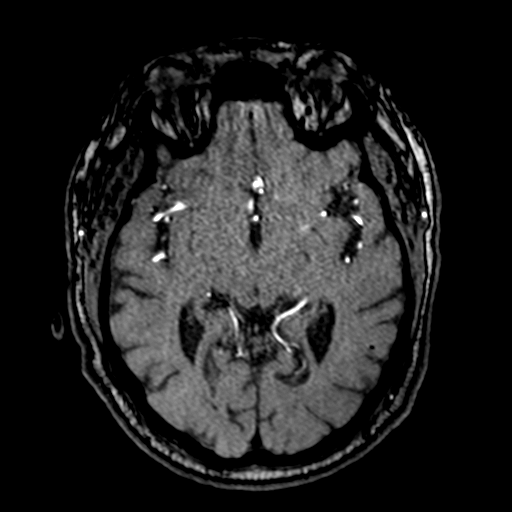
[im 132/188]
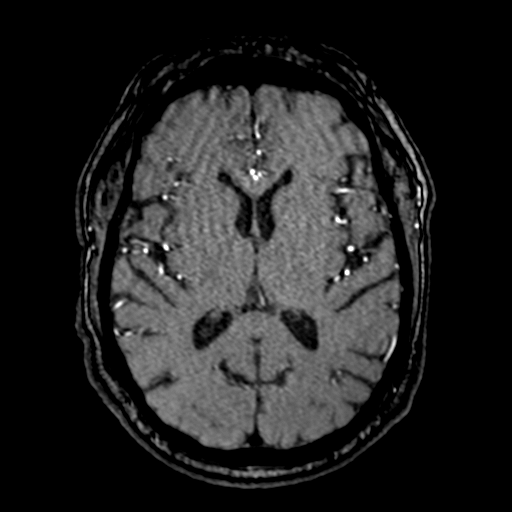
[im 156/188]
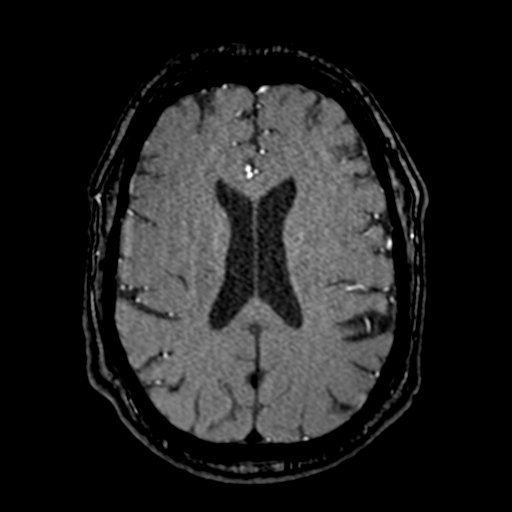
[im 160/188]
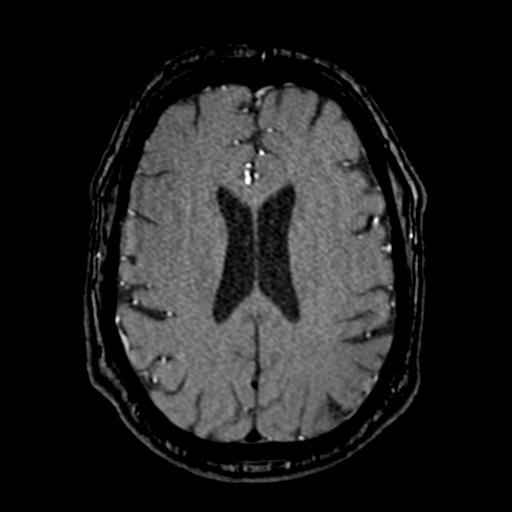
[im 180/188]
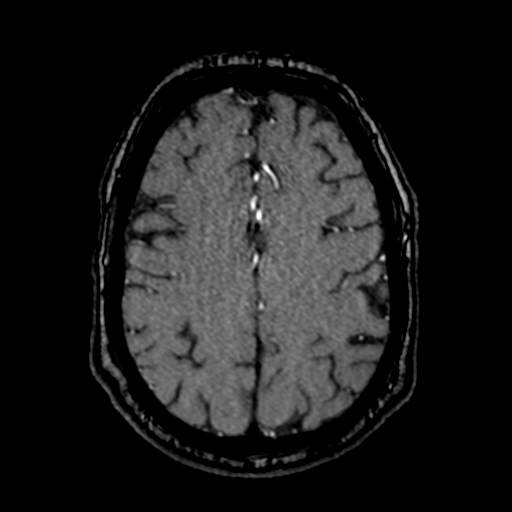

[20 of 48 positions shown; findings below may reference images not displayed]

FINDINGS: Symmetric carotid and vertebral arteries. Vessels are smooth and
widely patent. Negative for aneurysm.
IMPRESSION: Negative intracranial MRA.

## 2023-12-20 NOTE — Procedures (Signed)
 Jay Chen
